# Patient Record
Sex: Male | Born: 1945 | Race: White | Hispanic: No | Marital: Married | Smoking: Former smoker
Health system: Southern US, Community
[De-identification: ages and names within clinical notes are randomized; demographics above are authoritative.]

## PROBLEM LIST (undated history)

## (undated) DIAGNOSIS — Z87442 Personal history of urinary calculi: Secondary | ICD-10-CM

## (undated) DIAGNOSIS — C801 Malignant (primary) neoplasm, unspecified: Secondary | ICD-10-CM

## (undated) DIAGNOSIS — E785 Hyperlipidemia, unspecified: Secondary | ICD-10-CM

## (undated) DIAGNOSIS — K279 Peptic ulcer, site unspecified, unspecified as acute or chronic, without hemorrhage or perforation: Secondary | ICD-10-CM

## (undated) DIAGNOSIS — I10 Essential (primary) hypertension: Secondary | ICD-10-CM

## (undated) DIAGNOSIS — K219 Gastro-esophageal reflux disease without esophagitis: Secondary | ICD-10-CM

## (undated) DIAGNOSIS — H269 Unspecified cataract: Secondary | ICD-10-CM

## (undated) DIAGNOSIS — I219 Acute myocardial infarction, unspecified: Secondary | ICD-10-CM

## (undated) DIAGNOSIS — I2510 Atherosclerotic heart disease of native coronary artery without angina pectoris: Secondary | ICD-10-CM

## (undated) DIAGNOSIS — M1990 Unspecified osteoarthritis, unspecified site: Secondary | ICD-10-CM

## (undated) DIAGNOSIS — K635 Polyp of colon: Secondary | ICD-10-CM

## (undated) DIAGNOSIS — N200 Calculus of kidney: Secondary | ICD-10-CM

## (undated) DIAGNOSIS — T7840XA Allergy, unspecified, initial encounter: Secondary | ICD-10-CM

## (undated) DIAGNOSIS — K2270 Barrett's esophagus without dysplasia: Secondary | ICD-10-CM

## (undated) DIAGNOSIS — R0600 Dyspnea, unspecified: Secondary | ICD-10-CM

## (undated) DIAGNOSIS — E291 Testicular hypofunction: Secondary | ICD-10-CM

## (undated) HISTORY — PX: NECK LESION BIOPSY: SHX2078

## (undated) HISTORY — DX: Atherosclerotic heart disease of native coronary artery without angina pectoris: I25.10

## (undated) HISTORY — DX: Essential (primary) hypertension: I10

## (undated) HISTORY — DX: Unspecified osteoarthritis, unspecified site: M19.90

## (undated) HISTORY — PX: REFRACTIVE SURGERY: SHX103

## (undated) HISTORY — DX: Polyp of colon: K63.5

## (undated) HISTORY — PX: ANGIOPLASTY: SHX39

## (undated) HISTORY — DX: Barrett's esophagus without dysplasia: K22.70

## (undated) HISTORY — DX: Allergy, unspecified, initial encounter: T78.40XA

## (undated) HISTORY — PX: CARDIAC CATHETERIZATION: SHX172

## (undated) HISTORY — PX: KNEE ARTHROSCOPY: SUR90

## (undated) HISTORY — PX: BILROTH II PROCEDURE: SHX1232

## (undated) HISTORY — PX: CIRCUMCISION: SUR203

## (undated) HISTORY — PX: SKIN BIOPSY: SHX1

## (undated) HISTORY — PX: COLONOSCOPY: SHX174

## (undated) HISTORY — PX: UPPER GI ENDOSCOPY: SHX6162

## (undated) HISTORY — PX: WISDOM TOOTH EXTRACTION: SHX21

## (undated) HISTORY — DX: Acute myocardial infarction, unspecified: I21.9

## (undated) HISTORY — DX: Hyperlipidemia, unspecified: E78.5

## (undated) HISTORY — DX: Calculus of kidney: N20.0

## (undated) HISTORY — DX: Peptic ulcer, site unspecified, unspecified as acute or chronic, without hemorrhage or perforation: K27.9

## (undated) HISTORY — PX: POLYPECTOMY: SHX149

## (undated) HISTORY — DX: Unspecified cataract: H26.9

## (undated) HISTORY — DX: Gastro-esophageal reflux disease without esophagitis: K21.9

## (undated) HISTORY — PX: OTHER SURGICAL HISTORY: SHX169

---

## 1994-12-21 DIAGNOSIS — I219 Acute myocardial infarction, unspecified: Secondary | ICD-10-CM

## 1994-12-21 HISTORY — DX: Acute myocardial infarction, unspecified: I21.9

## 2013-08-16 DIAGNOSIS — I10 Essential (primary) hypertension: Secondary | ICD-10-CM | POA: Diagnosis not present

## 2013-08-16 DIAGNOSIS — Z125 Encounter for screening for malignant neoplasm of prostate: Secondary | ICD-10-CM | POA: Diagnosis not present

## 2013-08-16 DIAGNOSIS — E785 Hyperlipidemia, unspecified: Secondary | ICD-10-CM | POA: Diagnosis not present

## 2013-08-28 DIAGNOSIS — Z125 Encounter for screening for malignant neoplasm of prostate: Secondary | ICD-10-CM | POA: Diagnosis not present

## 2013-08-28 DIAGNOSIS — I10 Essential (primary) hypertension: Secondary | ICD-10-CM | POA: Diagnosis not present

## 2013-08-28 DIAGNOSIS — Z0000 Encounter for general adult medical examination without abnormal findings: Secondary | ICD-10-CM | POA: Diagnosis not present

## 2013-08-28 DIAGNOSIS — I259 Chronic ischemic heart disease, unspecified: Secondary | ICD-10-CM | POA: Diagnosis not present

## 2013-08-28 DIAGNOSIS — Z09 Encounter for follow-up examination after completed treatment for conditions other than malignant neoplasm: Secondary | ICD-10-CM | POA: Diagnosis not present

## 2013-08-28 DIAGNOSIS — E785 Hyperlipidemia, unspecified: Secondary | ICD-10-CM | POA: Diagnosis not present

## 2013-08-28 DIAGNOSIS — Z23 Encounter for immunization: Secondary | ICD-10-CM | POA: Diagnosis not present

## 2013-08-28 DIAGNOSIS — K219 Gastro-esophageal reflux disease without esophagitis: Secondary | ICD-10-CM | POA: Diagnosis not present

## 2013-08-28 DIAGNOSIS — E291 Testicular hypofunction: Secondary | ICD-10-CM | POA: Diagnosis not present

## 2013-08-28 DIAGNOSIS — Z1331 Encounter for screening for depression: Secondary | ICD-10-CM | POA: Diagnosis not present

## 2013-08-29 DIAGNOSIS — Z1212 Encounter for screening for malignant neoplasm of rectum: Secondary | ICD-10-CM | POA: Diagnosis not present

## 2013-08-29 LAB — IFOBT (OCCULT BLOOD): IFOBT: NEGATIVE

## 2013-09-25 ENCOUNTER — Encounter: Payer: Self-pay | Admitting: Internal Medicine

## 2013-09-29 ENCOUNTER — Encounter: Payer: Self-pay | Admitting: Cardiology

## 2013-09-29 ENCOUNTER — Ambulatory Visit (INDEPENDENT_AMBULATORY_CARE_PROVIDER_SITE_OTHER): Payer: Medicare Other | Admitting: Cardiology

## 2013-09-29 VITALS — BP 120/78 | HR 52 | Ht 68.5 in | Wt 206.0 lb

## 2013-09-29 DIAGNOSIS — I25810 Atherosclerosis of coronary artery bypass graft(s) without angina pectoris: Secondary | ICD-10-CM | POA: Diagnosis not present

## 2013-09-29 DIAGNOSIS — I2510 Atherosclerotic heart disease of native coronary artery without angina pectoris: Secondary | ICD-10-CM | POA: Diagnosis not present

## 2013-09-29 NOTE — Progress Notes (Signed)
HPI The patient presents as a new patient. He has a history of coronary artery disease with right coronary artery stenting in 1996. He thinks he's had his last stress test greater than 10 years ago. He was in [place] and has moved here. He has had no recent cardiovascular symptoms. He has a very significant family history for early onset coronary artery disease. He is active. He does some exercising a couple of times per week. The patient denies any new symptoms such as chest discomfort, neck or arm discomfort. There has been no new shortness of breath, PND or orthopnea. There have been no reported palpitations, presyncope or syncope.   Allergies not on file  Current Outpatient Prescriptions  Medication Sig Dispense Refill  . carvedilol (COREG) 12.5 MG tablet Take 12.5 mg by mouth 2 (two) times daily with a meal.      . Coenzyme Q10 (CO Q-10) 100 MG CAPS Take 200 mg by mouth daily.      . Multiple Vitamins-Minerals (MULTIVITAMIN PO) Take 1 tablet by mouth daily.      Marland Kitchen omeprazole (PRILOSEC) 10 MG capsule Take 10 mg by mouth daily.      . Oxymetazoline HCl (NASAL SPRAY NA) Place into the nose daily.      . simvastatin (ZOCOR) 40 MG tablet Take 40 mg by mouth every evening.      Marland Kitchen telmisartan-hydrochlorothiazide (MICARDIS HCT) 80-12.5 MG per tablet Take 1 tablet by mouth daily.       No current facility-administered medications for this visit.    Past Medical History  Diagnosis Date  . CAD (coronary artery disease)     Stent to the RCA 1996  . Dyslipidemia   . PUD (peptic ulcer disease)   . Cataract   . Nephrolithiasis     Past Surgical History  Procedure Laterality Date  . Bilroth ii procedure      ROS: As stated in the HPI and negative for all other systems.  PHYSICAL EXAM Ht 5' 8.5" (1.74 m)  Wt 206 lb (93.441 kg)  BMI 30.86 kg/m2 GENERAL:  Well appearing HEENT:  Pupils equal round and reactive, fundi not visualized, oral mucosa unremarkable NECK:  No jugular venous  distention, waveform within normal limits, carotid upstroke brisk and symmetric, no bruits, no thyromegaly LYMPHATICS:  No cervical, inguinal adenopathy LUNGS:  Clear to auscultation bilaterally BACK:  No CVA tenderness CHEST:  Unremarkable HEART:  PMI not displaced or sustained,S1 and S2 within normal limits, no S3, no S4, no clicks, no rubs, no  murmurs ABD:  Flat, positive bowel sounds normal in frequency in pitch, no bruits, no rebound, no guarding, no midline pulsatile mass, no hepatomegaly, no splenomegaly EXT:  2 plus pulses throughout, no edema, no cyanosis no clubbing SKIN:  No rashes no nodules NEURO:  Cranial nerves II through XII grossly intact, motor grossly intact throughout PSYCH:  Cognitively intact, oriented to person place and time   EKG:    Sinus rhythm, rate 52, axis within normal limits, intervals within normal limits, no acute ST-T wave changes.  He did have some ST depression mildly in nonspecific in the inferolateral leads. 09/29/2013  ASSESSMENT AND PLAN    CAD:  The patient has no active symptoms. However, I would like to screen given his very strong family history. I will bring the patient back for a POET (Plain Old Exercise Test). This will allow me to screen for obstructive coronary disease, risk stratify and very importantly provide a prescription  for exercise.  DYSLIPIDEMIA:  I did review some records from [facility] G, MD.  However, I did not see his recent lipids. He's on a lower dose of statin and might be suggested by current guidelines. However, I will defer management to Garlan Fillers, MD And I might not suggest changing his LDL is less than 70. Given the family history of early onset coronary artery disease it would be very aggressive with his lipids.we had a long discussion about exercise.

## 2013-09-29 NOTE — Patient Instructions (Signed)
Your physician has requested that you have an exercise tolerance test. For further information please visit [email or web address]. Please also follow instruction sheet, as given.  Your physician recommends that you continue on your current medications as directed. Please refer to the Current Medication list given to you today.  

## 2013-10-02 DIAGNOSIS — I2510 Atherosclerotic heart disease of native coronary artery without angina pectoris: Secondary | ICD-10-CM | POA: Insufficient documentation

## 2013-10-24 ENCOUNTER — Encounter: Payer: Self-pay | Admitting: Internal Medicine

## 2013-10-30 ENCOUNTER — Ambulatory Visit (INDEPENDENT_AMBULATORY_CARE_PROVIDER_SITE_OTHER): Payer: Medicare Other | Admitting: Internal Medicine

## 2013-10-30 ENCOUNTER — Encounter: Payer: Self-pay | Admitting: Internal Medicine

## 2013-10-30 VITALS — BP 118/70 | HR 56 | Ht 68.0 in | Wt 204.0 lb

## 2013-10-30 DIAGNOSIS — Z86010 Personal history of colonic polyps: Secondary | ICD-10-CM

## 2013-10-30 DIAGNOSIS — K2270 Barrett's esophagus without dysplasia: Secondary | ICD-10-CM

## 2013-10-30 DIAGNOSIS — K219 Gastro-esophageal reflux disease without esophagitis: Secondary | ICD-10-CM | POA: Diagnosis not present

## 2013-10-30 NOTE — Patient Instructions (Signed)
You have been scheduled for an endoscopy with propofol. Please follow written instructions given to you at your visit today. If you use inhalers (even only as needed), please bring them with you on the day of your procedure. Your physician has requested that you go to [email or web address] and enter the access code given to you at your visit today. This web site gives a general overview about your procedure. However, you should still follow specific instructions given to you by our office regarding your preparation for the procedure.                                                We are excited to introduce MyChart, a new best-in-class service that provides you online access to important information in your electronic medical record. We want to make it easier for you to view your health information - all in one secure location - when and where you need it. We expect MyChart will enhance the quality of care and service we provide.  When you register for MyChart, you can:    View your test results.    Request appointments and receive appointment reminders via email.    Request medication renewals.    View your medical history, allergies, medications and immunizations.    Communicate with your physician's office through a password-protected site.    Conveniently print information such as your medication lists.  To find out if MyChart is right for you, please talk to a member of our clinical staff today. We will gladly answer your questions about this free health and wellness tool.  If you are age 18 or older and want a member of your family to have access to your record, you must provide written consent by completing a proxy form available at our office. Please speak to our clinical staff about guidelines regarding accounts for patients younger than age 18.  As you activate your MyChart account and need any technical assistance, please call the MyChart technical support line at [phone]  [phone]) or email your question to [email or web address]. If you email your question(s), please include your name, a return phone number and the best time to reach you.  If you have non-urgent health-related questions, you can send a message to our office through MyChart at [email or web address]. If you have a medical emergency, call 911.  Thank you for using MyChart as your new health and wellness resource!   MyChart licensed from [employer],  1999-2010. Patents Pending.   

## 2013-10-30 NOTE — Progress Notes (Addendum)
Patient ID: Daniel Colon, male   DOB: 01-20-1946, 67 y.o.   MRN: [id number] HPI: Daniel Colon is a 67 year old male with a past medical history of CAD status post PCI in 1996, dyslipidemia, peptic ulcer disease status post Billroth II in approximately 2010 , history of adenomatous colon polyps, GERD with Barrett's esophagus was seen in consultation at the request of Dr. Eloise Harman for evaluation of Barrett's surveillance.  Daniel Colon is here alone today. Daniel Colon reports Daniel Colon is feeling well. Daniel Colon reports history of one prior upper endoscopy which revealed Barrett's esophagus. This was performed in [place]. Daniel Colon has been maintained on PPI continuously since his diagnosis of ulcer disease in 2010. Daniel Colon does report a history of heartburn about twice per month. This is even more rare with PPI. Regarding his ulcer surgery, Daniel Colon reports it happened quickly redeveloped pain was admitted to the hospital and had surgery shortly thereafter. Since the surgery Daniel Colon's had loose stools, but Daniel Colon denies a change in bowel habit including no blood in his stool or melena. Daniel Colon denies abdominal pain today. No dysphagia or odynophagia. No nausea or vomiting.  Daniel Colon reports history of multiple colonoscopies in the past, per records from Dr. Silvano Rusk office last colonoscopy was May 2011 polyps removed hyperplastic only. Notes indicate prior polyps were adenomatous, with initial colonoscopy around 2000 with one tubulovillous adenoma with high-grade dysplasia. Subsequent colonoscopies without significant adenomas.  This record is from Dr. Silvano Rusk office note, I do not yet have copies of each individual colonoscopy.  Past Medical History  Diagnosis Date  . CAD (coronary artery disease)     Stent to the RCA 1996  . Dyslipidemia   . PUD (peptic ulcer disease)   . Cataract   . Nephrolithiasis   . Barrett's esophagus   . Colon polyps   . GERD (gastroesophageal reflux disease)     Past Surgical History  Procedure Laterality Date  . Bilroth ii procedure    .  Angioplasty      stent - rt cor. art  . Ulcer surgery      Current Outpatient Prescriptions  Medication Sig Dispense Refill  . carvedilol (COREG) 12.5 MG tablet Take 12.5 mg by mouth 2 (two) times daily with a meal.      . Coenzyme Q10 (CO Q-10) 100 MG CAPS Take 200 mg by mouth daily.      . fish oil-omega-3 fatty acids 1000 MG capsule Take 2 g by mouth daily.      . Multiple Vitamins-Minerals (MULTIVITAMIN PO) Take 1 tablet by mouth daily.      Marland Kitchen omeprazole (PRILOSEC) 10 MG capsule Take 10 mg by mouth daily.      . Oxymetazoline HCl (NASAL SPRAY NA) Place into the nose daily.      . simvastatin (ZOCOR) 40 MG tablet Take 40 mg by mouth every evening.      Marland Kitchen telmisartan-hydrochlorothiazide (MICARDIS HCT) 80-12.5 MG per tablet Take 1 tablet by mouth daily.       No current facility-administered medications for this visit.    No Known Allergies  Family History  Problem Relation Age of Onset  . Sudden death Mother 40  . CAD Brother 74    CABG  . CAD Sister 72    CABG  . CAD Sister 67    CABG  . Cancer Brother   . Liver disease      History  Substance Use Topics  . Smoking status: Former Smoker -- 1.00 packs/day for 42 years  Types: Cigarettes    Quit date: 10/31/2011  . Smokeless tobacco: Current User     Comment: Quit 2.5 years ago.   . Alcohol Use: Yes     Comment: 2 drinks per week    ROS: As per history of present illness, otherwise negative  BP 118/70  Pulse 56  Ht 5\' 8"  (1.727 m)  Wt 204 lb (92.534 kg)  BMI 31.03 kg/m2 Constitutional: Well-developed and well-nourished. No distress. HEENT: Normocephalic and atraumatic. Oropharynx is clear and moist. No oropharyngeal exudate. Conjunctivae are normal.  No scleral icterus. Neck: Neck supple. Trachea midline. Cardiovascular: Normal rate, regular rhythm and intact distal pulses. No M/R/G Pulmonary/chest: Effort normal and breath sounds normal. No wheezing, rales or rhonchi. Abdominal: Soft, nontender,  nondistended. Bowel sounds active throughout. There are no masses palpable. No hepatosplenomegaly. Well-healed midline abdominal scar Extremities: no clubbing, cyanosis, or edema Lymphadenopathy: No cervical adenopathy noted. Neurological: Alert and oriented to person place and time. Skin: Skin is warm and dry. No rashes noted. Psychiatric: Normal mood and affect. Behavior is normal.  ASSESSMENT/PLAN: 67 year old male with a past medical history of CAD status post PCI in 1996, dyslipidemia, peptic ulcer disease status post Billroth II in approximately 2010 , history of adenomatous colon polyps, GERD with Barrett's esophagus was seen in consultation at the request of Dr. Eloise Harman for evaluation of Barrett's surveillance.   1.  Barrett's esophagus/GERD -- we are requesting his EGD report with pathology report for review. It seems that Daniel Colon has had only one endoscopy at least 3 years ago. Based on this, and current national guidelines for Barrett's surveillance, Daniel Colon would be due repeat upper endoscopy at this time. We discussed the test including the risks and benefits and Daniel Colon is agreeable to proceed. We discussed the best now supports indefinite PPI for patients with Barrett's esophagus. With this in mind, Daniel Colon will continue daily omeprazole. Daniel Colon is not having significant GERD symptoms at present.  2.  History of adenomatous colon polyps/CRC screening -- per PCP note, Daniel Colon had hyperplastic polyps only removed in 2011. Given his history of previous adenomatous polyps, Daniel Colon would be due repeat surveillance colonoscopy in May 2016.   Addendum - colonoscopy records received Colonoscopy dated 05/16/2010 -- Dr. Anselm Jungling -- [facility] -- 2 benign-appearing diminutive polyps in the rectum. Redundant colon. Examined portion of the ileum normal. Recommend repeat colonoscopy 5 years for surveillance EGD 05/05/2010 -- Dr. Anselm Jungling -- esophageal mucosal changes consistent with long segment Barrett's esophagus. This  was biopsied. GERD as evidenced by free flow of gastric contents into the esophagus. Bilious gastric fluid. Patent Billroth II gastrojejunostomy. Pathology results available

## 2013-10-31 ENCOUNTER — Ambulatory Visit: Payer: Self-pay | Admitting: Cardiology

## 2013-11-02 ENCOUNTER — Encounter: Payer: Self-pay | Admitting: Internal Medicine

## 2013-11-02 ENCOUNTER — Ambulatory Visit (AMBULATORY_SURGERY_CENTER): Payer: Medicare Other | Admitting: Internal Medicine

## 2013-11-02 VITALS — BP 110/60 | HR 46 | Temp 96.4°F | Resp 16 | Ht 68.0 in | Wt 204.0 lb

## 2013-11-02 DIAGNOSIS — K2270 Barrett's esophagus without dysplasia: Secondary | ICD-10-CM

## 2013-11-02 DIAGNOSIS — E669 Obesity, unspecified: Secondary | ICD-10-CM | POA: Diagnosis not present

## 2013-11-02 DIAGNOSIS — I739 Peripheral vascular disease, unspecified: Secondary | ICD-10-CM | POA: Diagnosis not present

## 2013-11-02 DIAGNOSIS — I2510 Atherosclerotic heart disease of native coronary artery without angina pectoris: Secondary | ICD-10-CM | POA: Diagnosis not present

## 2013-11-02 DIAGNOSIS — K219 Gastro-esophageal reflux disease without esophagitis: Secondary | ICD-10-CM | POA: Diagnosis not present

## 2013-11-02 MED ORDER — SODIUM CHLORIDE 0.9 % IV SOLN: 500.0000 mL | INTRAVENOUS | Status: DC

## 2013-11-02 NOTE — Patient Instructions (Signed)
Discharge instructions given with verbal understanding. Biopsies taken. Resume previous medications. YOU HAD AN ENDOSCOPIC PROCEDURE TODAY AT THE [facility]: Refer to the procedure report that was given to you for any specific questions about what was found during the examination.  If the procedure report does not answer your questions, please call your gastroenterologist to clarify.  If you requested that your care partner not be given the details of your procedure findings, then the procedure report has been included in a sealed envelope for you to review at your convenience later.  YOU SHOULD EXPECT: Some feelings of bloating in the abdomen. Passage of more gas than usual.  Walking can help get rid of the air that was put into your GI tract during the procedure and reduce the bloating. If you had a lower endoscopy (such as a colonoscopy or flexible sigmoidoscopy) you may notice spotting of blood in your stool or on the toilet paper. If you underwent a bowel prep for your procedure, then you may not have a normal bowel movement for a few days.  DIET: Your first meal following the procedure should be a light meal and then it is ok to progress to your normal diet.  A half-sandwich or bowl of soup is an example of a good first meal.  Heavy or fried foods are harder to digest and may make you feel nauseous or bloated.  Likewise meals heavy in dairy and vegetables can cause extra gas to form and this can also increase the bloating.  Drink plenty of fluids but you should avoid alcoholic beverages for 24 hours.  ACTIVITY: Your care partner should take you home directly after the procedure.  You should plan to take it easy, moving slowly for the rest of the day.  You can resume normal activity the day after the procedure however you should NOT DRIVE or use heavy machinery for 24 hours (because of the sedation medicines used during the test).    SYMPTOMS TO REPORT IMMEDIATELY: A gastroenterologist  can be reached at any hour.  During normal business hours, 8:30 AM to 5:00 PM Monday through Friday, call [phone].  After hours and on weekends, please call the GI answering service at [phone] who will take a message and have the physician on call contact you.   Following upper endoscopy (EGD)  Vomiting of blood or coffee ground material  New chest pain or pain under the shoulder blades  Painful or persistently difficult swallowing  New shortness of breath  Fever of 100F or higher  Black, tarry-looking stools  FOLLOW UP: If any biopsies were taken you will be contacted by phone or by letter within the next 1-3 weeks.  Call your gastroenterologist if you have not heard about the biopsies in 3 weeks.  Our staff will call the home number listed on your records the next business day following your procedure to check on you and address any questions or concerns that you may have at that time regarding the information given to you following your procedure. This is a courtesy call and so if there is no answer at the home number and we have not heard from you through the emergency physician on call, we will assume that you have returned to your regular daily activities without incident.  SIGNATURES/CONFIDENTIALITY: You and/or your care partner have signed paperwork which will be entered into your electronic medical record.  These signatures attest to the fact that that the information above on your After   Visit Summary has been reviewed and is understood.  Full responsibility of the confidentiality of this discharge information lies with you and/or your care-partner. 

## 2013-11-02 NOTE — Progress Notes (Signed)
Patient did not experience any of the following events: a burn prior to discharge; a fall within the facility; wrong site/side/patient/procedure/implant event; or a hospital transfer or hospital admission upon discharge from the facility. (G8907) Patient did not have preoperative order for IV antibiotic SSI prophylaxis. (G8918)  

## 2013-11-02 NOTE — Progress Notes (Signed)
Called to room to assist during endoscopic procedure.  Patient ID and intended procedure confirmed with present staff. Received instructions for my participation in the procedure from the performing physician.  

## 2013-11-02 NOTE — Op Note (Signed)
[facility] [address] [place], [address]   ENDOSCOPY PROCEDURE REPORT  PATIENT: Daniel Colon, Daniel Colon  MR#: [id number] BIRTHDATE: 1946/07/26 , 67  yrs. old GENDER: Male ENDOSCOPIST: Beverley Fiedler, MD REFERRED BY:  Jarome Matin, M.D. PROCEDURE DATE:  11/02/2013 PROCEDURE:  EGD w/ biopsy ASA CLASS:     Class II INDICATIONS:  history of Barrett's esophagus. MEDICATIONS: MAC sedation, administered by CRNA and propofol (Diprivan) 200mg  IV TOPICAL ANESTHETIC: Cetacaine Spray  DESCRIPTION OF PROCEDURE: After the risks benefits and alternatives of the procedure were thoroughly explained, informed consent was obtained.  The LB [id number] endoscope was introduced through the mouth and advanced to the proximal jejunum. Without limitations.  The instrument was slowly withdrawn as the mucosa was fully examined.    ESOPHAGUS: There was evidence of Barrett's esophagus in the lower third of the esophagus. Beginning 36 cm from the incisors and extending to the top of the gastric folds at 39 cm.  Multiple biopsies were performed using cold forceps.  Sample sent for histology.   Pr[place] Criteria C2 M3.  A 2 cm hiatal hernia was noted.  STOMACH: A Billroth II anastomosis was found in the gastric body characterized as healthy in appearance.  JEJUNUM: The exam showed no abnormalities in the jejunum.  Retroflexed views revealed a hiatal hernia.     The scope was then withdrawn from the patient and the procedure completed.  COMPLICATIONS: There were no complications.  ENDOSCOPIC IMPRESSION: 1.   There was evidence of Barrett's esophagus; multiple biopsies 2.   2 cm hiatal hernia 3.   Billroth II anastomosis was found in the gastric body 4.   The exam showed no abnormalities  RECOMMENDATIONS: 1.  Await pathology results 2.  Continue taking your PPI (antiacid medicine) once daily.  It is best to be taken 20-30 minutes prior to breakfast meal.  REPEAT EXAM: for EGD  pending biopsy results. eSigned:  Beverley Fiedler, MD 11/02/2013 9:52 AM  CC:The Patient and Jarome Matin, MD  PATIENT NAME:  Daniel Colon, Daniel Colon MR#: [id number]

## 2013-11-02 NOTE — Progress Notes (Signed)
Lidocaine-40mg IV prior to Propofol InductionPropofol given over incremental dosages 

## 2013-11-03 ENCOUNTER — Telehealth: Payer: Self-pay

## 2013-11-03 NOTE — Telephone Encounter (Signed)
  Follow up Call-  Call back number 11/02/2013  Post procedure Call Back phone  # [phone] OK TO SPEAK WITH WIFE  Permission to leave phone message Yes     Patient questions:  Do you have a fever, pain , or abdominal swelling? no Pain Score  0 *  Have you tolerated food without any problems? yes  Have you been able to return to your normal activities? yes  Do you have any questions about your discharge instructions: Diet   no Medications  no Follow up visit  no  Do you have questions or concerns about your Care? no  Actions: * If pain score is 4 or above: No action needed, pain <4.

## 2013-11-09 ENCOUNTER — Encounter: Payer: Self-pay | Admitting: Internal Medicine

## 2013-11-13 ENCOUNTER — Encounter: Payer: Self-pay | Admitting: *Deleted

## 2013-11-13 ENCOUNTER — Ambulatory Visit (INDEPENDENT_AMBULATORY_CARE_PROVIDER_SITE_OTHER): Payer: Medicare Other | Admitting: Cardiology

## 2013-11-13 DIAGNOSIS — I25810 Atherosclerosis of coronary artery bypass graft(s) without angina pectoris: Secondary | ICD-10-CM | POA: Diagnosis not present

## 2013-11-13 DIAGNOSIS — R9439 Abnormal result of other cardiovascular function study: Secondary | ICD-10-CM | POA: Diagnosis not present

## 2013-11-13 DIAGNOSIS — Z01810 Encounter for preprocedural cardiovascular examination: Secondary | ICD-10-CM | POA: Diagnosis not present

## 2013-11-13 LAB — BASIC METABOLIC PANEL
BUN: 18 mg/dL (ref 6–23)
CO2: 24 mEq/L (ref 19–32)
Chloride: 105 mEq/L (ref 96–112)
Creatinine, Ser: 0.9 mg/dL (ref 0.4–1.5)
GFR: 93.52 mL/min (ref >60.00)
Glucose, Bld: 92 mg/dL (ref 70–99)
Potassium: 4 mEq/L (ref 3.5–5.1)

## 2013-11-13 LAB — CBC
HCT: 39.8 % (ref 39.0–52.0)
Hemoglobin: 13.3 g/dL (ref 13.0–17.0)
MCHC: 33.4 g/dL (ref 30.0–36.0)
MCV: 81.1 fl (ref 78.0–100.0)
RDW: 14.4 % (ref 11.5–14.6)

## 2013-11-13 LAB — PROTIME-INR: INR: 1 ratio (ref 0.8–1.0)

## 2013-11-13 NOTE — Progress Notes (Signed)
Exercise Treadmill Test  Pre-Exercise Testing Evaluation Rhythm: normal sinus  Rate: 63     Test  Exercise Tolerance Test Ordering MD: Angelina Sheriff, MD  Interpreting MD: Angelina Sheriff, MD  Unique Test No: 1  Treadmill:  1  Indication for ETT: known ASHD  Contraindication to ETT: No   Stress Modality: exercise - treadmill  Cardiac Imaging Performed: non   Protocol: standard Bruce - maximal  Max BP:  152/67  Max MPHR (bpm):  155 85% MPR (bpm):  133  MPHR obtained (bpm):  110 % MPHR obtained:  75  Reached 85% MPHR (min:sec):  NA Total Exercise Time (min-sec):  4:57  Workload in METS:  6.9 Borg Scale: 13  Reason ETT Terminated:  downsloping ST depression    ST Segment Analysis At Rest: non-specific ST segment slurring With Exercise: significant ischemic ST depression  Other Information Arrhythmia:  No Angina during ETT:  absent (0) Quality of ETT:  diagnostic  ETT Interpretation:  abnormal - evidence of ST depression consistent with ischemia  Comments: The patient had a markedly abnormal exercise treadmill with 3 mm ST depression in downsloping in the inferior and lateral leads.  He did not have chest discomfort however.  Recommendations: Early positive exercise treadmill test with a high pretest probability of obstructive coronary disease. He will have cardiac catheterization. The patient understands that risks included but are not limited to stroke (1 in 1000), death (1 in 1000), kidney failure [usually temporary] (1 in 500), bleeding (1 in 200), allergic reaction [possibly serious] (1 in 200).  The patient understands and agrees to proceed.

## 2013-11-13 NOTE — Patient Instructions (Signed)
The current medical regimen is effective;  continue present plan and medications.  Please have blood work today.  Your physician has requested that you have a cardiac catheterization. Cardiac catheterization is used to diagnose and/or treat various heart conditions. Doctors may recommend this procedure for a number of different reasons. The most common reason is to evaluate chest pain. Chest pain can be a symptom of coronary artery disease (CAD), and cardiac catheterization can show whether plaque is narrowing or blocking your heart's arteries. This procedure is also used to evaluate the valves, as well as measure the blood flow and oxygen levels in different parts of your heart. For further information please visit [email or web address] Please follow instruction sheet, as given.  Further follow up will be scheduled at the time of your catheterization.

## 2013-11-16 ENCOUNTER — Other Ambulatory Visit: Payer: Self-pay | Admitting: Cardiovascular Disease

## 2013-11-16 DIAGNOSIS — R9439 Abnormal result of other cardiovascular function study: Secondary | ICD-10-CM

## 2013-11-17 ENCOUNTER — Ambulatory Visit (HOSPITAL_COMMUNITY)
Admission: RE | Admit: 2013-11-17 | Discharge: 2013-11-17 | Disposition: A | Discharge status: Home / Self Care | Payer: Medicare Other | Source: Ambulatory Visit | Attending: Cardiovascular Disease | Admitting: Cardiovascular Disease

## 2013-11-17 ENCOUNTER — Encounter (HOSPITAL_COMMUNITY)
Admission: RE | Disposition: A | Discharge status: Home / Self Care | Payer: Self-pay | Source: Ambulatory Visit | Attending: Cardiovascular Disease

## 2013-11-17 DIAGNOSIS — I2510 Atherosclerotic heart disease of native coronary artery without angina pectoris: Secondary | ICD-10-CM | POA: Diagnosis not present

## 2013-11-17 DIAGNOSIS — E785 Hyperlipidemia, unspecified: Secondary | ICD-10-CM | POA: Diagnosis not present

## 2013-11-17 DIAGNOSIS — Y831 Surgical operation with implant of artificial internal device as the cause of abnormal reaction of the patient, or of later complication, without mention of misadventure at the time of the procedure: Secondary | ICD-10-CM | POA: Insufficient documentation

## 2013-11-17 DIAGNOSIS — Z9861 Coronary angioplasty status: Secondary | ICD-10-CM | POA: Diagnosis not present

## 2013-11-17 DIAGNOSIS — R9439 Abnormal result of other cardiovascular function study: Secondary | ICD-10-CM | POA: Diagnosis not present

## 2013-11-17 DIAGNOSIS — Z87891 Personal history of nicotine dependence: Secondary | ICD-10-CM | POA: Insufficient documentation

## 2013-11-17 HISTORY — PX: LEFT HEART CATHETERIZATION WITH CORONARY ANGIOGRAM: SHX5451

## 2013-11-17 SURGERY — LEFT HEART CATHETERIZATION WITH CORONARY ANGIOGRAM
Anesthesia: LOCAL

## 2013-11-17 MED ORDER — SODIUM CHLORIDE 0.9 % IV SOLN: 250.0000 mL | INTRAVENOUS | Status: DC | PRN

## 2013-11-17 MED ORDER — ASPIRIN 81 MG PO CHEW
81.0000 mg | CHEWABLE_TABLET | ORAL | Status: AC
  Administered 2013-11-17: 81 mg via ORAL

## 2013-11-17 MED ORDER — SODIUM CHLORIDE 0.9 % IV SOLN
INTRAVENOUS | Status: DC
  Administered 2013-11-17: 1000 mL via INTRAVENOUS

## 2013-11-17 MED ORDER — ACETAMINOPHEN 325 MG PO TABS: 650.0000 mg | ORAL_TABLET | ORAL | Status: DC | PRN

## 2013-11-17 MED ORDER — ASPIRIN 81 MG PO CHEW
CHEWABLE_TABLET | ORAL | Status: AC
  Filled 2013-11-17: qty 1

## 2013-11-17 MED ORDER — HEPARIN SODIUM (PORCINE) 1000 UNIT/ML IJ SOLN
INTRAMUSCULAR | Status: AC
  Filled 2013-11-17: qty 1

## 2013-11-17 MED ORDER — VERAPAMIL HCL 2.5 MG/ML IV SOLN
INTRAVENOUS | Status: AC
  Filled 2013-11-17: qty 2

## 2013-11-17 MED ORDER — SODIUM CHLORIDE 0.9 % IV SOLN: 1.0000 mL/kg/h | INTRAVENOUS | Status: DC

## 2013-11-17 MED ORDER — LIDOCAINE HCL (PF) 1 % IJ SOLN
INTRAMUSCULAR | Status: AC
  Filled 2013-11-17: qty 30

## 2013-11-17 MED ORDER — DIAZEPAM 5 MG PO TABS
5.0000 mg | ORAL_TABLET | ORAL | Status: AC
  Administered 2013-11-17: 5 mg via ORAL

## 2013-11-17 MED ORDER — SODIUM CHLORIDE 0.9 % IJ SOLN: 3.0000 mL | INTRAMUSCULAR | Status: DC | PRN

## 2013-11-17 MED ORDER — FENTANYL CITRATE 0.05 MG/ML IJ SOLN
INTRAMUSCULAR | Status: AC
  Filled 2013-11-17: qty 2

## 2013-11-17 MED ORDER — DIAZEPAM 5 MG PO TABS
ORAL_TABLET | ORAL | Status: AC
  Filled 2013-11-17: qty 1

## 2013-11-17 MED ORDER — HEPARIN (PORCINE) IN NACL 2-0.9 UNIT/ML-% IJ SOLN
INTRAMUSCULAR | Status: AC
  Filled 2013-11-17: qty 1500

## 2013-11-17 MED ORDER — OXYCODONE-ACETAMINOPHEN 5-325 MG PO TABS: 1.0000 | ORAL_TABLET | ORAL | Status: DC | PRN

## 2013-11-17 MED ORDER — MIDAZOLAM HCL 2 MG/2ML IJ SOLN
INTRAMUSCULAR | Status: AC
  Filled 2013-11-17: qty 2

## 2013-11-17 MED ORDER — SODIUM CHLORIDE 0.9 % IJ SOLN: 3.0000 mL | Freq: Two times a day (BID) | INTRAMUSCULAR | Status: DC

## 2013-11-17 MED ORDER — NITROGLYCERIN 0.2 MG/ML ON CALL CATH LAB
INTRAVENOUS | Status: AC
  Filled 2013-11-17: qty 1

## 2013-11-17 MED ORDER — ONDANSETRON HCL 4 MG/2ML IJ SOLN: 4.0000 mg | Freq: Four times a day (QID) | INTRAMUSCULAR | Status: DC | PRN

## 2013-11-17 NOTE — Interval H&P Note (Signed)
History and Physical Interval Note:  11/17/2013 8:01 AM  Daniel Colon  has presented today for surgery, with the diagnosis of positive stress test  The various methods of treatment have been discussed with the patient and family. After consideration of risks, benefits and other options for treatment, the patient has consented to  Procedure(s): LEFT HEART CATHETERIZATION WITH CORONARY ANGIOGRAM (N/A) as a surgical intervention .  The patient's history has been reviewed, patient examined, no change in status, stable for surgery.  I have reviewed the patient's chart and labs.  Questions were answered to the patient's satisfaction.    Cath Lab Visit (complete for each Cath Lab visit)  Clinical Evaluation Leading to the Procedure:   ACS: no  Non-ACS:    Anginal Classification: No Symptoms  Anti-ischemic medical therapy: Minimal Therapy (1 class of medications)  Non-Invasive Test Results: High-risk stress test findings: cardiac mortality >3%/year  Prior CABG: No previous CABG       Tonny Bollman

## 2013-11-17 NOTE — CV Procedure (Signed)
    Cardiac Catheterization Procedure Note  Name: Daniel Colon MRN: [id number] DOB: May 11, 1946  Procedure: Left Heart Cath, Selective Coronary Angiography, LV angiography  Indication: Abnormal stress EKG with high-risk features, known CAD   Procedural Details: The right wrist was prepped, draped, and anesthetized with 1% lidocaine. Using the modified Seldinger technique, a 5/6 French sheath was introduced into the right radial artery. 3 mg of verapamil was administered through the sheath, weight-based unfractionated heparin was administered intravenously. Standard Judkins catheters were used for selective coronary angiography and left ventriculography. Catheter exchanges were performed over an exchange length guidewire. There were no immediate procedural complications. A TR band was used for radial hemostasis at the completion of the procedure.  The patient was transferred to the post catheterization recovery area for further monitoring.  Procedural Findings: Hemodynamics: AO 103/55 LV 104/12  Coronary angiography: Coronary dominance: right  Left mainstem: Moderate calcification. No significant obstruction. Arises from the left cusp and divides into the LAD and LCx.  Left anterior descending (LAD): Diffusely diseased and calcified vessel. The proximal vessel has 30-40% stenosis. The first diagonal is patent with mild nonobstructive stenosis no greater than 30%. The mid-LAD has moderately severe 70-75% stenosis.  Left circumflex (LCx): The LCx is large in caliber. There is a medium caliber OM1 without significant stenosis. The mid-distal LCx has 70% stenosis and it supplies a large OM2 branch.  Right coronary artery (RCA): Dominant vessel with mild-moderate calcification. The stented segment in the mid-vessel is patent with 40% restenosis. Otherwise the vessel has diffuse irregularity without significant stenosis.   Left ventriculography: Left ventricular systolic function is normal, LVEF  is estimated at 55-65%, there is no significant mitral regurgitation   Final Conclusions:   1. Moderate diffuse CAD with borderline lesions in the mid-LAD and mid-LCx and continued patency of the RCA stent with mild in-stent restenosis.  2. Normal LV function  Recommendations: The patient has lesions in the mid-LAD and mid-LCx which may be hemodynamically significant. However, they are clearly not critically stenosed. Considering this patient's asymptomatic status at a normal functional capacity, I would favor ongoing medical therapy.   Tonny Bollman 11/17/2013, 8:50 AM

## 2013-11-17 NOTE — Progress Notes (Signed)
H&P needed, Dr Excell Seltzer notified.

## 2013-11-17 NOTE — H&P (Signed)
History and Physical  Patient ID: Daniel Colon MRN: [id number], SOB: [birth year] 67 y.o. Date of Encounter: 11/17/2013, 7:56 AM  Primary Physician: Garlan Fillers, MD Primary Cardiologist: Dr Antoine Poche  Chief Complaint: Abnormal Stress Test  HPI: 67 y.o. male w/ PMHx significant for CAD who presented to [facility] on 11/17/2013 for cardiac catheterization.  The patient underwent stenting of the RCA in 1996 in [place]. He has no symptoms of chest pain, shortness of breath, or palpitations. He exercises without exertional symptoms.   He recently underwent a screening treadmill study that was markedly abnormal with early and marked ST depression. This study was high-risk and he was sent for cath and possible PCI.   Past Medical History  Diagnosis Date  . CAD (coronary artery disease)     Stent to the RCA 1996  . Dyslipidemia   . PUD (peptic ulcer disease)   . Cataract   . Nephrolithiasis   . Barrett's esophagus   . Colon polyps   . GERD (gastroesophageal reflux disease)      Surgical History:  Past Surgical History  Procedure Laterality Date  . Bilroth ii procedure    . Angioplasty      stent - rt cor. art  . Ulcer surgery       Home Meds: Prior to Admission medications   Medication Sig Start Date End Date Taking? Authorizing Provider  carvedilol (COREG) 12.5 MG tablet Take 12.5 mg by mouth 2 (two) times daily with a meal.   Yes Historical Provider, MD  Coenzyme Q10 (CO Q-10) 100 MG CAPS Take 200 mg by mouth daily.   Yes Historical Provider, MD  fish oil-omega-3 fatty acids 1000 MG capsule Take 2 g by mouth daily.   Yes Historical Provider, MD  Multiple Vitamins-Minerals (MULTIVITAMIN PO) Take 1 tablet by mouth daily.   Yes Historical Provider, MD  omeprazole (PRILOSEC) 10 MG capsule Take 10 mg by mouth daily.   Yes Historical Provider, MD  Oxymetazoline HCl (NASAL SPRAY NA) Place into the nose daily.   Yes Historical Provider, MD  simvastatin (ZOCOR) 40 MG  tablet Take 40 mg by mouth every evening.   Yes Historical Provider, MD  telmisartan-hydrochlorothiazide (MICARDIS HCT) 80-12.5 MG per tablet Take 1 tablet by mouth daily.   Yes Historical Provider, MD  Tetrahydrozoline HCl (VISINE OP) Apply 1 drop to eye daily as needed (for dry eyes).   Yes Historical Provider, MD    Allergies: No Known Allergies  History   Social History  . Marital Status: Married    Spouse Name: N/A    Number of Children: 1  . Years of Education: N/A   Occupational History  . Not on file.   Social History Main Topics  . Smoking status: Former Smoker -- 1.00 packs/day for 42 years    Types: Cigarettes    Quit date: 10/31/2011  . Smokeless tobacco: Current User    Types: Chew     Comment: Quit 2.5 years ago.   . Alcohol Use: Yes     Comment: 2 drinks per week  . Drug Use: No  . Sexual Activity: Not on file   Other Topics Concern  . Not on file   Social History Narrative   Lives with wife and mother in law.      Family History  Problem Relation Age of Onset  . Sudden death Mother 51  . CAD Brother 76    CABG  . CAD Sister 5  CABG  . CAD Sister 77    CABG  . Cancer Brother   . Liver disease      Review of Systems: General: negative for chills, fever, night sweats or weight changes.  ENT: negative for rhinorrhea or epistaxis Cardiovascular: negative for chest pain, shortness of breath, dyspnea on exertion, edema, orthopnea, palpitations, or paroxysmal nocturnal dyspnea Dermatological: negative for rash Respiratory: negative for cough or wheezing GI: negative for nausea, vomiting, diarrhea, bright red blood per rectum, melena, or hematemesis GU: no hematuria, urgency, or frequency Neurologic: negative for visual changes, syncope, headache, or dizziness Heme: no easy bruising or bleeding Endo: negative for excessive thirst, thyroid disorder, or flushing Musculoskeletal: negative for joint pain or swelling, negative for myalgias All other  systems reviewed and are otherwise negative except as noted above.  Physical Exam: Blood pressure 158/82, pulse 67, temperature 97.1 F (36.2 C), temperature source Oral, resp. rate 18, height 5\' 8"  (1.727 m), weight 200 lb (90.719 kg), SpO2 100.00%. General: Well developed, well nourished, alert and oriented, in no acute distress. HEENT: Normocephalic, atraumatic, sclera non-icteric, no xanthomas, nares are without discharge.  Neck: Supple. Carotids 2+ without bruits. JVP normal Lungs: Clear bilaterally to auscultation without wheezes, rales, or rhonchi. Breathing is unlabored. Heart: RRR with normal S1 and S2. No murmurs, rubs, or gallops appreciated. Abdomen: Soft, non-tender, non-distended with normoactive bowel sounds. No hepatomegaly. No rebound/guarding. No obvious abdominal masses. Back: No CVA tenderness Msk:  Strength and tone appear normal for age. Extremities: No clubbing, cyanosis, or edema.  Distal pedal pulses are 2+ and equal bilaterally. Neuro: CNII-XII intact, moves all extremities spontaneously. Psych:  Responds to questions appropriately with a normal affect.   Labs:   Lab Results  Component Value Date   WBC 7.0 11/13/2013   HGB 13.3 11/13/2013   HCT 39.8 11/13/2013   MCV 81.1 11/13/2013   PLT 197.0 11/13/2013    Recent Labs Lab 11/13/13 1253  NA 138  K 4.0  CL 105  CO2 24  BUN 18  CREATININE 0.9  CALCIUM 9.2  GLUCOSE 92   No results found for this basename: CKTOTAL, CKMB, TROPONINI,  in the last 72 hours No results found for this basename: CHOL, HDL, LDLCALC, TRIG   No results found for this basename: DDIMER    Radiology/Studies:  No results found.   GXT 11/13/2013: Exercise Treadmill Test  Pre-Exercise Testing Evaluation  Rhythm: normal sinus  Rate: 63   Test  Exercise Tolerance Test  Ordering MD: Angelina Sheriff, MD  Interpreting MD: Angelina Sheriff, MD   Unique Test No: 1  Treadmill: 1   Indication for ETT: known ASHD  Contraindication to  ETT: No   Stress Modality: exercise - treadmill  Cardiac Imaging Performed: non   Protocol: standard Bruce - maximal  Max BP: 152/67   Max MPHR (bpm): 155  85% MPR (bpm): 133   MPHR obtained (bpm): 110  % MPHR obtained: 75   Reached 85% MPHR (min:sec): NA  Total Exercise Time (min-sec): 4:57   Workload in METS: 6.9  Borg Scale: 13   Reason ETT Terminated: downsloping ST depression    ST Segment Analysis  At Rest: non-specific ST segment slurring  With Exercise: significant ischemic ST depression  Other Information  Arrhythmia: No Angina during ETT: absent (0)  Quality of ETT: diagnostic  ETT Interpretation: abnormal - evidence of ST depression consistent with ischemia  Comments:  The patient had a markedly abnormal exercise treadmill with 3 mm ST depression  in downsloping in the inferior and lateral leads. He did not have chest discomfort however.  Recommendations:  Early positive exercise treadmill test with a high pretest probability of obstructive coronary disease. He will have cardiac catheterization. The patient understands that risks included but are not limited to stroke (1 in 1000), death (1 in 1000), kidney failure [usually temporary] (1 in 500), bleeding (1 in 200), allergic reaction [possibly serious] (1 in 200). The patient understands and agrees to proceed.   ASSESSMENT AND PLAN:  CAD with high-risk stress ECG study. Plan cardiac cath and possible PCI. Risks, indications, alternatives reviewed with patient and wife who understand and agree to proceed.  Enzo Bi  11/17/2013, 7:56 AM

## 2013-11-21 ENCOUNTER — Telehealth: Payer: Self-pay | Admitting: Cardiology

## 2013-11-21 NOTE — Telephone Encounter (Signed)
New Message   Pt called--- states that during his cath with Dr. Excell Seltzer he was advised to begin Plavix// pt requests a call back to discuss when he should begin this medication.

## 2013-11-21 NOTE — Telephone Encounter (Signed)
Per wife - Dr Excell Seltzer had wanted pt to start Plavix for medical treatment of CAD found on recent cath but due to pts extensive stomach surgery r/t perforated ulcer decided to let Dr Antoine Poche make the decision as to whether he should take it or not.  Wife aware I will discuss with MD and call her back with instructions and any orders.

## 2013-11-25 NOTE — Telephone Encounter (Signed)
I have reviewed the GI notes and I would not be inclined to start the Plavix at this point.

## 2013-11-27 ENCOUNTER — Encounter: Payer: Self-pay | Admitting: Physician Assistant

## 2013-11-27 ENCOUNTER — Ambulatory Visit (INDEPENDENT_AMBULATORY_CARE_PROVIDER_SITE_OTHER): Payer: Medicare Other | Admitting: Physician Assistant

## 2013-11-27 VITALS — BP 132/70 | HR 80 | Ht 69.0 in | Wt 209.0 lb

## 2013-11-27 DIAGNOSIS — E785 Hyperlipidemia, unspecified: Secondary | ICD-10-CM

## 2013-11-27 DIAGNOSIS — K219 Gastro-esophageal reflux disease without esophagitis: Secondary | ICD-10-CM

## 2013-11-27 DIAGNOSIS — I10 Essential (primary) hypertension: Secondary | ICD-10-CM

## 2013-11-27 DIAGNOSIS — I2510 Atherosclerotic heart disease of native coronary artery without angina pectoris: Secondary | ICD-10-CM

## 2013-11-27 MED ORDER — NITROGLYCERIN 0.4 MG/SPRAY TL SOLN: 1.0000 | Status: DC | PRN

## 2013-11-27 NOTE — Patient Instructions (Addendum)
Your physician wants you to follow-up in: 12 MONTHS WITH DR [facility]  ( Patient preference to f/u in 12 months instead of 6 months ). You will receive a reminder letter in the mail two months in advance. If you don't receive a letter, please call our office to schedule the follow-up appointment.  Ntg spray sent to [facility]

## 2013-11-27 NOTE — Progress Notes (Addendum)
[address] [place], [place]  [address] Phone: [phone] Fax:  [phone]  Date:  11/27/2013   ID:  Daniel Colon, DOB 1946/05/26, MRN [id number]  PCP:  Garlan Fillers, MD  Cardiologist:  Dr. Rollene Rotunda     History of Present Illness: Daniel Colon is a 67 y.o. male with a hx of CAD, s/p stent to RCA in 1996 in [place], HL, GERD.  He established with Dr. Rollene Rotunda 09/2013.  He was set up for a routine ETT for follow up.  ETT(11/13/13): abnormal with early ST changes to suggest ischemia. He was set up for [facility].  LHC (11/17/13):  pLAD 30-40, D1 30, mLAD 70-75, mCFX 70, mRCA stent 40 (ISR), EF 55-65%.  Medical Rx recommended.  He is doing well. He has occasional atypical chest pain. He has experienced this for years. There have been no significant changes. He denies significant dyspnea. He is NYHA class II. He denies orthopnea or PND. He has some mild dependent LE edema. He denies syncope.   Recent Labs: 11/13/2013: Creatinine 0.9; Hemoglobin 13.3; Potassium 4.0   Wt Readings from Last 3 Encounters:  11/27/13 209 lb (94.802 kg)  11/17/13 200 lb (90.719 kg)  11/17/13 200 lb (90.719 kg)     Past Medical History  Diagnosis Date  . Dyslipidemia   . PUD (peptic ulcer disease)   . Cataract   . Nephrolithiasis   . Barrett's esophagus   . Colon polyps   . GERD (gastroesophageal reflux disease)   . CAD (coronary artery disease)     a. Stent to the RCA 1996 (in [place]);  b. ETT(11/13/13): abnormal with early ST changes to suggest ischemia;   c. LHC (11/17/13):  pLAD 30-40, D1 30, mLAD 70-75, mCFX 70, mRCA stent 40 (ISR), EF 55-65%.  Medical Rx    Current Outpatient Prescriptions  Medication Sig Dispense Refill  . carvedilol (COREG) 12.5 MG tablet Take 12.5 mg by mouth 2 (two) times daily with a meal.      . Coenzyme Q10 (CO Q-10) 100 MG CAPS Take 200 mg by mouth daily.      . fish oil-omega-3 fatty acids 1000 MG capsule Take 2 g by mouth daily.      . Multiple  Vitamins-Minerals (MULTIVITAMIN PO) Take 1 tablet by mouth daily.      Marland Kitchen omeprazole (PRILOSEC) 10 MG capsule Take 10 mg by mouth daily.      . Oxymetazoline HCl (NASAL SPRAY NA) Place into the nose daily.      . simvastatin (ZOCOR) 40 MG tablet Take 40 mg by mouth every evening.      Marland Kitchen telmisartan-hydrochlorothiazide (MICARDIS HCT) 80-12.5 MG per tablet Take 1 tablet by mouth daily.      . Tetrahydrozoline HCl (VISINE OP) Apply 1 drop to eye daily as needed (for dry eyes).       No current facility-administered medications for this visit.    Allergies:   Review of patient's allergies indicates no known allergies.   Social History:  The patient  reports that he quit smoking about 2 years ago. His smoking use included Cigarettes. He has a 42 pack-year smoking history. His smokeless tobacco use includes Chew. He reports that he drinks alcohol. He reports that he does not use illicit drugs.   Family History:  The patient's family history includes CAD (age of onset: 79) in his sister; CAD (age of onset: 62) in his brother and sister; Cancer in his brother; Liver disease  in an other family member; Sudden death (age of onset: 87) in his mother.   ROS:  Please see the history of present illness.      All other systems reviewed and negative.   PHYSICAL EXAM: VS:  BP 132/70  Pulse 80  Ht 5\' 9"  (1.753 m)  Wt 209 lb (94.802 kg)  BMI 30.85 kg/m2 Well nourished, well developed, in no acute distress HEENT: normal Neck: no JVD Cardiac:  normal S1, S2; RRR; no murmur Lungs:  clear to auscultation bilaterally, no wheezing, rhonchi or rales Abd: soft, nontender, no hepatomegaly Ext: no edema; right wrist without hematoma or mass  Skin: warm and dry Neuro:  CNs 2-12 intact, no focal abnormalities noted  EKG:  NSR, HR 80, normal axis, nonspecific ST-T wave changes with diffuse ST segment scooping     ASSESSMENT AND PLAN:  1. CAD:  No angina. He had a false positive exercise treadmill test. Continue  current therapy with beta blocker and statin. He is not on an aspirin due to history of significant peptic ulcer disease, status post Billroth II procedure and Barrett's esophagus. I will review with his gastroenterologist to see if it is acceptable for him to take aspirin. Dr. Antoine Poche has previously reviewed his chart and recommended against taking Plavix. 2. Hypertension:  Controlled. Continue current therapy. 3. Hyperlipidemia:  Continue statin. Labs are followed by his PCP. 4. GERD:  Continue follow up with gastroenterology. 5. Disposition:  I recommended follow up with Dr. Antoine Poche in 6 months. The patient preferred to follow up in 1 year.  Signed, Tereso Newcomer, PA-C  11/27/2013 11:33 AM    Addendum: I reviewed with his gastroenterologist. He may take aspirin 81 mg daily. He has been advised to remain on PPI indefinitely. Signed, Tereso Newcomer, PA-C   11/27/2013 4:49 PM

## 2013-11-28 ENCOUNTER — Telehealth: Payer: Self-pay | Admitting: *Deleted

## 2013-11-28 MED ORDER — ASPIRIN EC 81 MG PO TBEC: 81.0000 mg | DELAYED_RELEASE_TABLET | Freq: Every day | ORAL | Status: DC

## 2013-11-28 NOTE — Telephone Encounter (Signed)
pt notified per Dr. Rhea Belton and Tereso Newcomer, PA to start ASA 81 mg qd, make sure to take omeprazole daily. Pt verblized understanding to all instructions.

## 2013-11-28 NOTE — Telephone Encounter (Signed)
Susan aware.

## 2014-08-14 DIAGNOSIS — H25049 Posterior subcapsular polar age-related cataract, unspecified eye: Secondary | ICD-10-CM | POA: Diagnosis not present

## 2014-08-15 ENCOUNTER — Telehealth: Payer: Self-pay | Admitting: Pharmacist

## 2014-08-15 NOTE — Telephone Encounter (Signed)
Received a letter from [facility] stating patient is enrolled into the [facility] which is a study using a PCSK-9 inhibitor called bococizumab.  He was enrolled 09/2013 and the study will be a 1 year double blind placebo controlled trial.    FYI to Dr. Percival Spanish

## 2014-08-24 DIAGNOSIS — Z125 Encounter for screening for malignant neoplasm of prostate: Secondary | ICD-10-CM | POA: Diagnosis not present

## 2014-08-24 DIAGNOSIS — I10 Essential (primary) hypertension: Secondary | ICD-10-CM | POA: Diagnosis not present

## 2014-08-24 DIAGNOSIS — E785 Hyperlipidemia, unspecified: Secondary | ICD-10-CM | POA: Diagnosis not present

## 2014-08-31 DIAGNOSIS — I259 Chronic ischemic heart disease, unspecified: Secondary | ICD-10-CM | POA: Diagnosis not present

## 2014-08-31 DIAGNOSIS — I10 Essential (primary) hypertension: Secondary | ICD-10-CM | POA: Diagnosis not present

## 2014-08-31 DIAGNOSIS — L989 Disorder of the skin and subcutaneous tissue, unspecified: Secondary | ICD-10-CM | POA: Diagnosis not present

## 2014-08-31 DIAGNOSIS — E785 Hyperlipidemia, unspecified: Secondary | ICD-10-CM | POA: Diagnosis not present

## 2014-08-31 DIAGNOSIS — Z0000 Encounter for general adult medical examination without abnormal findings: Secondary | ICD-10-CM | POA: Diagnosis not present

## 2014-08-31 DIAGNOSIS — N529 Male erectile dysfunction, unspecified: Secondary | ICD-10-CM | POA: Diagnosis not present

## 2014-08-31 DIAGNOSIS — H9190 Unspecified hearing loss, unspecified ear: Secondary | ICD-10-CM | POA: Diagnosis not present

## 2014-08-31 DIAGNOSIS — M25469 Effusion, unspecified knee: Secondary | ICD-10-CM | POA: Diagnosis not present

## 2014-09-06 DIAGNOSIS — H903 Sensorineural hearing loss, bilateral: Secondary | ICD-10-CM | POA: Diagnosis not present

## 2014-09-07 DIAGNOSIS — L821 Other seborrheic keratosis: Secondary | ICD-10-CM | POA: Diagnosis not present

## 2014-09-07 DIAGNOSIS — L570 Actinic keratosis: Secondary | ICD-10-CM | POA: Diagnosis not present

## 2014-09-07 DIAGNOSIS — D2360 Other benign neoplasm of skin of unspecified upper limb, including shoulder: Secondary | ICD-10-CM | POA: Diagnosis not present

## 2014-09-07 DIAGNOSIS — L819 Disorder of pigmentation, unspecified: Secondary | ICD-10-CM | POA: Diagnosis not present

## 2014-09-07 DIAGNOSIS — L738 Other specified follicular disorders: Secondary | ICD-10-CM | POA: Diagnosis not present

## 2014-09-07 DIAGNOSIS — L723 Sebaceous cyst: Secondary | ICD-10-CM | POA: Diagnosis not present

## 2014-09-07 DIAGNOSIS — D1801 Hemangioma of skin and subcutaneous tissue: Secondary | ICD-10-CM | POA: Diagnosis not present

## 2014-09-10 DIAGNOSIS — Z1212 Encounter for screening for malignant neoplasm of rectum: Secondary | ICD-10-CM | POA: Diagnosis not present

## 2014-09-14 DIAGNOSIS — Z23 Encounter for immunization: Secondary | ICD-10-CM | POA: Diagnosis not present

## 2014-09-27 ENCOUNTER — Other Ambulatory Visit: Payer: Self-pay | Admitting: Physician Assistant

## 2014-10-08 DIAGNOSIS — H179 Unspecified corneal scar and opacity: Secondary | ICD-10-CM | POA: Diagnosis not present

## 2014-10-08 DIAGNOSIS — H2512 Age-related nuclear cataract, left eye: Secondary | ICD-10-CM | POA: Diagnosis not present

## 2014-10-08 DIAGNOSIS — H25042 Posterior subcapsular polar age-related cataract, left eye: Secondary | ICD-10-CM | POA: Diagnosis not present

## 2014-10-08 DIAGNOSIS — H52202 Unspecified astigmatism, left eye: Secondary | ICD-10-CM | POA: Diagnosis not present

## 2014-10-08 DIAGNOSIS — H2511 Age-related nuclear cataract, right eye: Secondary | ICD-10-CM | POA: Diagnosis not present

## 2014-10-08 DIAGNOSIS — H25012 Cortical age-related cataract, left eye: Secondary | ICD-10-CM | POA: Diagnosis not present

## 2014-11-06 DIAGNOSIS — H25042 Posterior subcapsular polar age-related cataract, left eye: Secondary | ICD-10-CM | POA: Diagnosis not present

## 2014-11-06 DIAGNOSIS — H2512 Age-related nuclear cataract, left eye: Secondary | ICD-10-CM | POA: Diagnosis not present

## 2014-11-29 ENCOUNTER — Encounter (HOSPITAL_COMMUNITY): Payer: Self-pay | Admitting: Cardiovascular Disease

## 2014-12-24 ENCOUNTER — Ambulatory Visit (INDEPENDENT_AMBULATORY_CARE_PROVIDER_SITE_OTHER): Payer: Medicare Other | Admitting: Cardiology

## 2014-12-24 ENCOUNTER — Encounter: Payer: Self-pay | Admitting: Cardiology

## 2014-12-24 VITALS — BP 130/68 | HR 59 | Ht 69.0 in | Wt 204.6 lb

## 2014-12-24 DIAGNOSIS — I2510 Atherosclerotic heart disease of native coronary artery without angina pectoris: Secondary | ICD-10-CM | POA: Diagnosis not present

## 2014-12-24 DIAGNOSIS — I2583 Coronary atherosclerosis due to lipid rich plaque: Principal | ICD-10-CM

## 2014-12-24 MED ORDER — ROSUVASTATIN CALCIUM 20 MG PO TABS: 20.0000 mg | ORAL_TABLET | Freq: Every day | ORAL | Status: DC

## 2014-12-24 NOTE — Patient Instructions (Addendum)
Your physician recommends that you schedule a follow-up appointment in: one year with Dr. Percival Spanish  We are switching you to Crestor as your cholesterol medication

## 2014-12-24 NOTE — Progress Notes (Signed)
HPI The patient presents for follow up of  CAD, s/p stent to RCA in 1996 in [place].  RoutineETT(11/13/13) was abnormal with early ST changes to suggest ischemia. He was set up had LHC (11/17/13): pLAD 30-40, D1 30, mLAD 70-75, mCFX 70, mRCA stent 40 (ISR), EF 55-65%.  He was managed medically.  Since that time he has done well. He is exercising with his wife 3 times per week.   The patient denies any new symptoms such as chest discomfort, neck or arm discomfort. There has been no new shortness of breath, PND or orthopnea. There have been no reported palpitations, presyncope or syncope.  No Known Allergies  Current Outpatient Prescriptions  Medication Sig Dispense Refill  . aspirin EC 81 MG tablet Take 1 tablet (81 mg total) by mouth daily.    . carvedilol (COREG) 12.5 MG tablet Take 12.5 mg by mouth 2 (two) times daily with a meal.    . Coenzyme Q10 (CO Q-10) 100 MG CAPS Take 200 mg by mouth daily.    . fish oil-omega-3 fatty acids 1000 MG capsule Take 2 g by mouth daily.    . Multiple Vitamins-Minerals (MULTIVITAMIN PO) Take 1 tablet by mouth daily.    . nitroGLYCERIN (NITROLINGUAL) 0.4 MG/SPRAY spray SPRAY 1 SPRAY UNDER THE TONGUE EVERY 5 MINUTES FOR 3 DOSES AS NEEDED FOR CHEST PAIN 25 g 2  . omeprazole (PRILOSEC) 10 MG capsule Take 10 mg by mouth daily.    . Oxymetazoline HCl (NASAL SPRAY NA) Place into the nose daily.    . simvastatin (ZOCOR) 40 MG tablet Take 40 mg by mouth every evening.    Marland Kitchen telmisartan-hydrochlorothiazide (MICARDIS HCT) 80-12.5 MG per tablet Take 1 tablet by mouth daily.     No current facility-administered medications for this visit.    Past Medical History  Diagnosis Date  . Dyslipidemia   . PUD (peptic ulcer disease)   . Cataract   . Nephrolithiasis   . Barrett's esophagus   . Colon polyps   . GERD (gastroesophageal reflux disease)   . CAD (coronary artery disease)     a. Stent to the [place] (in [place]);  b. ETT(11/13/13): abnormal with early ST  changes to suggest ischemia;   c. LHC (11/17/13):  pLAD 30-40, D1 30, mLAD 70-75, mCFX 70, mRCA stent 40 (ISR), EF 55-65%.  Medical Rx    Past Surgical History  Procedure Laterality Date  . Bilroth ii procedure    . Angioplasty      stent - rt cor. art  . Ulcer surgery    . Left heart catheterization with coronary angiogram N/A 11/17/2013    Procedure: LEFT HEART CATHETERIZATION WITH CORONARY ANGIOGRAM;  Surgeon: Blane Ohara, MD;  Location: [facility] CATH LAB;  Service: Cardiovascular;  Laterality: N/A;    ROS:  Bruises.  Otherwise astated in the HPI and negative for all other systems.  PHYSICAL EXAM BP 130/68 mmHg  Pulse 59  Ht 5\' 9"  (1.753 m)  Wt 204 lb 9.6 oz (92.806 kg)  BMI 30.20 kg/m2 GENERAL:  Well appearing HEENT:  Pupils equal round and reactive, fundi not visualized, oral mucosa unremarkable NECK:  No jugular venous distention, waveform within normal limits, carotid upstroke brisk and symmetric, no bruits, no thyromegaly LYMPHATICS:  No cervical, inguinal adenopathy LUNGS:  Clear to auscultation bilaterally BACK:  No CVA tenderness CHEST:  Unremarkable HEART:  PMI not displaced or sustained,S1 and S2 within normal limits, no S3, no S4, no clicks, no  rubs, no  murmurs ABD:  Flat, positive bowel sounds normal in frequency in pitch, no bruits, no rebound, no guarding, no midline pulsatile mass, no hepatomegaly, no splenomegaly, abdominal scar well healed. EXT:  2 plus pulses throughout, no edema, no cyanosis no clubbing SKIN:  No rashes no nodules NEURO:  Cranial nerves II through XII grossly intact, motor grossly intact throughout PSYCH:  Cognitively intact, oriented to person place and time   EKG:    Sinus rhythm, rate 60, axis within normal limits, intervals within normal limits, no acute ST-T wave changes.  He did have some ST depression mildly in nonspecific in the inferolateral leads. 12/24/2014  ASSESSMENT AND PLAN    CAD: The patient has no new sypmtoms.  No further  cardiovascular testing is indicated.  We will continue with aggressive risk reduction and meds as listed.  DYSLIPIDEMIA:  I did review some records from [facility] G, [place].  His LDL was 98.  With his degree of vascular disease I would suggest that the goal should be an LDL less than 70. I have switched him to Crestor 20 mg daily. I will send him back to his primary care physician in about 10 weeks for followup labs.

## 2014-12-27 ENCOUNTER — Telehealth: Payer: Self-pay | Admitting: *Deleted

## 2014-12-27 ENCOUNTER — Encounter: Payer: Self-pay | Admitting: Cardiology

## 2014-12-27 ENCOUNTER — Other Ambulatory Visit: Payer: Self-pay | Admitting: Cardiology

## 2014-12-27 NOTE — Telephone Encounter (Signed)
PA for Crestor sent via CoverMyMeds

## 2014-12-27 NOTE — Telephone Encounter (Signed)
Needs authorization for his Crestor 20 mg please. Please call to [facility] with authorization.Marland Kitchen

## 2014-12-27 NOTE — Telephone Encounter (Signed)
PA for Crestor denied, faxing list of alternative medications to Dr. Percival Spanish for further evaluation.

## 2014-12-31 ENCOUNTER — Telehealth: Payer: Self-pay | Admitting: *Deleted

## 2014-12-31 DIAGNOSIS — E785 Hyperlipidemia, unspecified: Secondary | ICD-10-CM

## 2014-12-31 NOTE — Telephone Encounter (Signed)
Patient is needing an alternative for crestor. Atorvastatin, fluvastatin,lovastatin, pravastatin and simvastatin are preferred. Will forward for dr hochrein's review.

## 2015-01-04 MED ORDER — ATORVASTATIN CALCIUM 40 MG PO TABS: 40.0000 mg | ORAL_TABLET | Freq: Every day | ORAL | Status: DC

## 2015-01-04 NOTE — Telephone Encounter (Signed)
I would suggest Lipitor 40 mg and then repeat a lipid and liver in 10 weeks.  Call Mr. Ropp with the results

## 2015-01-04 NOTE — Telephone Encounter (Signed)
Spoke with pt, Patient voiced understanding of medication change and need for repeat lab work. [facility] and lab orders mailed to patients confirmed home address.

## 2015-03-22 DIAGNOSIS — E785 Hyperlipidemia, unspecified: Secondary | ICD-10-CM | POA: Diagnosis not present

## 2015-09-06 DIAGNOSIS — Z125 Encounter for screening for malignant neoplasm of prostate: Secondary | ICD-10-CM | POA: Diagnosis not present

## 2015-09-06 DIAGNOSIS — E785 Hyperlipidemia, unspecified: Secondary | ICD-10-CM | POA: Diagnosis not present

## 2015-09-06 DIAGNOSIS — I10 Essential (primary) hypertension: Secondary | ICD-10-CM | POA: Diagnosis not present

## 2015-09-13 DIAGNOSIS — Z9861 Coronary angioplasty status: Secondary | ICD-10-CM | POA: Diagnosis not present

## 2015-09-13 DIAGNOSIS — E669 Obesity, unspecified: Secondary | ICD-10-CM | POA: Diagnosis not present

## 2015-09-13 DIAGNOSIS — K219 Gastro-esophageal reflux disease without esophagitis: Secondary | ICD-10-CM | POA: Diagnosis not present

## 2015-09-13 DIAGNOSIS — Z23 Encounter for immunization: Secondary | ICD-10-CM | POA: Diagnosis not present

## 2015-09-13 DIAGNOSIS — E291 Testicular hypofunction: Secondary | ICD-10-CM | POA: Diagnosis not present

## 2015-09-13 DIAGNOSIS — E785 Hyperlipidemia, unspecified: Secondary | ICD-10-CM | POA: Diagnosis not present

## 2015-09-13 DIAGNOSIS — Z0000 Encounter for general adult medical examination without abnormal findings: Secondary | ICD-10-CM | POA: Diagnosis not present

## 2015-09-13 DIAGNOSIS — M25462 Effusion, left knee: Secondary | ICD-10-CM | POA: Diagnosis not present

## 2015-09-13 DIAGNOSIS — I10 Essential (primary) hypertension: Secondary | ICD-10-CM | POA: Diagnosis not present

## 2015-09-13 DIAGNOSIS — Z6830 Body mass index (BMI) 30.0-30.9, adult: Secondary | ICD-10-CM | POA: Diagnosis not present

## 2015-09-13 DIAGNOSIS — K2270 Barrett's esophagus without dysplasia: Secondary | ICD-10-CM | POA: Diagnosis not present

## 2015-09-13 DIAGNOSIS — Z1389 Encounter for screening for other disorder: Secondary | ICD-10-CM | POA: Diagnosis not present

## 2015-09-16 ENCOUNTER — Telehealth: Payer: Self-pay | Admitting: Internal Medicine

## 2015-09-16 ENCOUNTER — Encounter: Payer: Self-pay | Admitting: Internal Medicine

## 2015-09-16 NOTE — Telephone Encounter (Signed)
Pt was due for repeat colon in May due to history of adenomatous polyps. Please schedule pt for colon.

## 2015-09-17 DIAGNOSIS — Z1212 Encounter for screening for malignant neoplasm of rectum: Secondary | ICD-10-CM | POA: Diagnosis not present

## 2015-10-03 DIAGNOSIS — G4700 Insomnia, unspecified: Secondary | ICD-10-CM | POA: Diagnosis not present

## 2015-10-29 ENCOUNTER — Ambulatory Visit (AMBULATORY_SURGERY_CENTER): Payer: Self-pay

## 2015-10-29 VITALS — Ht 69.0 in | Wt 205.6 lb

## 2015-10-29 DIAGNOSIS — Z860100 Personal history of colon polyps, unspecified: Secondary | ICD-10-CM

## 2015-10-29 MED ORDER — SUPREP BOWEL PREP KIT 17.5-3.13-1.6 GM/177ML PO SOLN: 1.0000 | Freq: Once | ORAL | Status: DC

## 2015-10-29 NOTE — Progress Notes (Signed)
No allergies to eggs or soy No diet/weight loss meds No home oxygen No past problems with anesthesia  Has email and internet; refused emmi 

## 2015-11-11 ENCOUNTER — Encounter: Payer: Self-pay | Admitting: Internal Medicine

## 2015-11-11 ENCOUNTER — Encounter: Payer: Medicare Other | Admitting: Internal Medicine

## 2015-11-11 ENCOUNTER — Ambulatory Visit (AMBULATORY_SURGERY_CENTER): Payer: Medicare Other | Admitting: Internal Medicine

## 2015-11-11 VITALS — BP 131/71 | HR 55 | Temp 97.1°F | Resp 15 | Ht 69.0 in | Wt 205.0 lb

## 2015-11-11 DIAGNOSIS — K635 Polyp of colon: Secondary | ICD-10-CM | POA: Diagnosis not present

## 2015-11-11 DIAGNOSIS — Z86010 Personal history of colonic polyps: Secondary | ICD-10-CM | POA: Diagnosis not present

## 2015-11-11 DIAGNOSIS — D125 Benign neoplasm of sigmoid colon: Secondary | ICD-10-CM

## 2015-11-11 DIAGNOSIS — D123 Benign neoplasm of transverse colon: Secondary | ICD-10-CM

## 2015-11-11 DIAGNOSIS — I252 Old myocardial infarction: Secondary | ICD-10-CM | POA: Diagnosis not present

## 2015-11-11 DIAGNOSIS — I2510 Atherosclerotic heart disease of native coronary artery without angina pectoris: Secondary | ICD-10-CM | POA: Diagnosis not present

## 2015-11-11 MED ORDER — SODIUM CHLORIDE 0.9 % IV SOLN: 500.0000 mL | INTRAVENOUS | Status: DC

## 2015-11-11 NOTE — Progress Notes (Signed)
A/ox3, pleased with MAC, report to RN 

## 2015-11-11 NOTE — Patient Instructions (Signed)
YOU HAD AN ENDOSCOPIC PROCEDURE TODAY AT THE [facility]:   Refer to the procedure report that was given to you for any specific questions about what was found during the examination.  If the procedure report does not answer your questions, please call your gastroenterologist to clarify.  If you requested that your care partner not be given the details of your procedure findings, then the procedure report has been included in a sealed envelope for you to review at your convenience later.  YOU SHOULD EXPECT: Some feelings of bloating in the abdomen. Passage of more gas than usual.  Walking can help get rid of the air that was put into your GI tract during the procedure and reduce the bloating. If you had a lower endoscopy (such as a colonoscopy or flexible sigmoidoscopy) you may notice spotting of blood in your stool or on the toilet paper. If you underwent a bowel prep for your procedure, you may not have a normal bowel movement for a few days.  Please Note:  You might notice some irritation and congestion in your nose or some drainage.  This is from the oxygen used during your procedure.  There is no need for concern and it should clear up in a day or so.  SYMPTOMS TO REPORT IMMEDIATELY:   Following lower endoscopy (colonoscopy or flexible sigmoidoscopy):  Excessive amounts of blood in the stool  Significant tenderness or worsening of abdominal pains  Swelling of the abdomen that is new, acute  Fever of 100F or higher   For urgent or emergent issues, a gastroenterologist can be reached at any hour by calling [phone].   DIET: Your first meal following the procedure should be a small meal and then it is ok to progress to your normal diet. Heavy or fried foods are harder to digest and may make you feel nauseous or bloated.  Likewise, meals heavy in dairy and vegetables can increase bloating.  Drink plenty of fluids but you should avoid alcoholic beverages for 24  hours.  ACTIVITY:  You should plan to take it easy for the rest of today and you should NOT DRIVE or use heavy machinery until tomorrow (because of the sedation medicines used during the test).    FOLLOW UP: Our staff will call the number listed on your records the next business day following your procedure to check on you and address any questions or concerns that you may have regarding the information given to you following your procedure. If we do not reach you, we will leave a message.  However, if you are feeling well and you are not experiencing any problems, there is no need to return our call.  We will assume that you have returned to your regular daily activities without incident.  If any biopsies were taken you will be contacted by phone or by letter within the next 1-3 weeks.  Please call us at [phone] if you have not heard about the biopsies in 3 weeks.    SIGNATURES/CONFIDENTIALITY: You and/or your care partner have signed paperwork which will be entered into your electronic medical record.  These signatures attest to the fact that that the information above on your After Visit Summary has been reviewed and is understood.  Full responsibility of the confidentiality of this discharge information lies with you and/or your care-partner. 

## 2015-11-11 NOTE — Progress Notes (Signed)
Called to room to assist during endoscopic procedure.  Patient ID and intended procedure confirmed with present staff. Received instructions for my participation in the procedure from the performing physician.  

## 2015-11-11 NOTE — Op Note (Signed)
[place] [facility]  [address] [place], [address]   COLONOSCOPY PROCEDURE REPORT  PATIENT: Daniel, Colon  MR#: [id number] BIRTHDATE: 02/01/1946 , 33  yrs. old GENDER: male ENDOSCOPIST: Jerene Bears, MD REFERRED UT:MLYYTK Philip Aspen, M.D. PROCEDURE DATE:  11/11/2015 PROCEDURE:   Colonoscopy, surveillance and Colonoscopy with snare polypectomy First Screening Colonoscopy - Avg.  risk and is 50 yrs.  old or older - No.  Prior Negative Screening - Now for repeat screening. N/A  History of Adenoma - Now for follow-up colonoscopy & has been > or = to 3 yrs.  Yes hx of adenoma.  Has been 3 or more years since last colonoscopy.  Polyps removed today? Yes ASA CLASS:   Class II INDICATIONS:Surveillance due to prior colonic neoplasia.   Personal history of colonic polyps (2011 [place]) MEDICATIONS: Monitored anesthesia care and Propofol 250 mg IV  DESCRIPTION OF PROCEDURE:   After the risks benefits and alternatives of the procedure were thoroughly explained, informed consent was obtained.  The digital rectal exam revealed no rectal mass.   The LB [id number]-[id number]  endoscope was introduced through the anus and advanced to the cecum, which was identified by both the appendix and ileocecal valve. No adverse events experienced. The quality of the prep was good.  (Suprep was used)  The instrument was then slowly withdrawn as the colon was fully examined. Estimated blood loss is zero unless otherwise noted in this procedure report.  COLON FINDINGS: Two sessile polyps ranging between 3-31m in size were found in the transverse colon and sigmoid colon. Polypectomies were performed with a cold snare.  The resection was complete, the polyp tissue was completely retrieved and sent to histology.   There was mild diverticulosis noted in the descending colon.  Retroflexed views revealed internal hemorrhoids. The time to cecum = 2.8 Withdrawal time = 15.1   The scope was withdrawn and the  procedure completed. COMPLICATIONS: There were no immediate complications.  ENDOSCOPIC IMPRESSION: 1.   Two sessile polyps ranging between 3-562min size were found in the transverse colon and sigmoid colon; polypectomies were performed with a cold snare 2.   Mild diverticulosis was noted in the descending colon  RECOMMENDATIONS: 1.  Await pathology results 2.  High fiber diet 3.  If the polyps removed today are proven to be adenomatous (pre-cancerous) polyps, you will need a repeat colonoscopy in 5 years.  Otherwise you should continue to follow colorectal cancer screening guidelines for "routine risk" patients with colonoscopy in 10 years.  You will receive a letter within 1-2 weeks with the results of your biopsy as well as final recommendations.  Please call my office if you have not received a letter after 3 weeks.  eSigned:  JaJerene BearsMD 11/11/2015 9:06 AM  cc: DaLeanna BattlesMD and The Patient

## 2015-11-12 ENCOUNTER — Telehealth: Payer: Self-pay

## 2015-11-12 NOTE — Telephone Encounter (Signed)
  Follow up Call-  Call back number 11/11/2015 11/02/2013  Post procedure Call Back phone  # [phone] OK TO SPEAK WITH WIFE  Permission to leave phone message Yes Yes     Patient questions:  Do you have a fever, pain , or abdominal swelling? No. Pain Score  0 *  Have you tolerated food without any problems? Yes.    Have you been able to return to your normal activities? Yes.    Do you have any questions about your discharge instructions: Diet   No. Medications  No. Follow up visit  No.  Do you have questions or concerns about your Care? No.  Actions: * If pain score is 4 or above: No action needed, pain <4.

## 2015-11-14 ENCOUNTER — Other Ambulatory Visit: Payer: Self-pay | Admitting: Cardiology

## 2015-11-18 ENCOUNTER — Encounter: Payer: Self-pay | Admitting: Internal Medicine

## 2015-12-06 ENCOUNTER — Encounter: Payer: Self-pay | Admitting: Acute Care

## 2015-12-09 ENCOUNTER — Other Ambulatory Visit: Payer: Self-pay | Admitting: Acute Care

## 2015-12-09 DIAGNOSIS — Z87891 Personal history of nicotine dependence: Secondary | ICD-10-CM

## 2015-12-11 ENCOUNTER — Encounter: Payer: Medicare Other | Admitting: Acute Care

## 2015-12-11 ENCOUNTER — Ambulatory Visit
Admission: RE | Admit: 2015-12-11 | Discharge: 2015-12-11 | Disposition: A | Discharge status: Home / Self Care | Payer: Medicare Other | Source: Ambulatory Visit | Attending: Acute Care | Admitting: Acute Care

## 2015-12-11 DIAGNOSIS — Z87891 Personal history of nicotine dependence: Secondary | ICD-10-CM | POA: Diagnosis not present

## 2015-12-12 ENCOUNTER — Other Ambulatory Visit: Payer: Self-pay | Admitting: Cardiology

## 2015-12-12 NOTE — Telephone Encounter (Signed)
Rx(s) sent to pharmacy electronically.  

## 2015-12-30 ENCOUNTER — Ambulatory Visit: Payer: TRICARE For Life (TFL) | Admitting: Cardiology

## 2015-12-30 ENCOUNTER — Encounter: Payer: Self-pay | Admitting: Cardiology

## 2016-01-09 ENCOUNTER — Encounter: Payer: Self-pay | Admitting: Cardiology

## 2016-01-31 ENCOUNTER — Ambulatory Visit: Payer: TRICARE For Life (TFL) | Admitting: Cardiology

## 2016-02-02 NOTE — Progress Notes (Signed)
HPI The patient presents for follow up of  CAD, s/p stent to RCA in 1996 in [place].  RoutineETT(11/13/13) was abnormal with early ST changes to suggest ischemia. He was set up had LHC (11/17/13): pLAD 30-40, D1 30, mLAD 70-75, mCFX 70, mRCA stent 40 (ISR), EF 55-65%.  He was managed medically.  Since that time he has done well. He is exercising at the gym 3 times per week.  He also golfs.   The patient denies any new symptoms such as chest discomfort, neck or arm discomfort. There has been no new shortness of breath, PND or orthopnea. There have been no reported palpitations, presyncope or syncope.  Of note he is getting yearly CT scans of his chest because of some mild abnormalities and his smoking history.     No Known Allergies  Current Outpatient Prescriptions  Medication Sig Dispense Refill  . aspirin EC 81 MG tablet Take 1 tablet (81 mg total) by mouth daily.    Marland Kitchen atorvastatin (LIPITOR) 40 MG tablet TAKE 1 TABLET DAILY 90 tablet 3  . carvedilol (COREG) 12.5 MG tablet Take 12.5 mg by mouth 2 (two) times daily with a meal.    . chlorhexidine (PERIDEX) 0.12 % solution daily.  0  . Coenzyme Q10 (CO Q-10) 100 MG CAPS Take 200 mg by mouth daily.    Marland Kitchen ezetimibe (ZETIA) 10 MG tablet Take 10 mg by mouth daily.    . fish oil-omega-3 fatty acids 1000 MG capsule Take 2 g by mouth daily.    Marland Kitchen ipratropium (ATROVENT) 0.03 % nasal spray     . Multiple Vitamins-Minerals (MULTIVITAMIN PO) Take 1 tablet by mouth daily.    . nitroGLYCERIN (NITROLINGUAL) 0.4 MG/SPRAY spray USE 1 SPRAY UNDER THE TONGUE EVERY 5 MINUTES FOR 3 DOSES AS NEEDED FOR CHEST PAIN 24 g 0  . omeprazole (PRILOSEC) 10 MG capsule Take 10 mg by mouth daily.    Marland Kitchen telmisartan-hydrochlorothiazide (MICARDIS HCT) 80-12.5 MG per tablet Take 1 tablet by mouth daily.     No current facility-administered medications for this visit.    Past Medical History  Diagnosis Date  . Dyslipidemia   . PUD (peptic ulcer disease)   . Cataract   .  Nephrolithiasis   . Barrett's esophagus   . Colon polyps   . GERD (gastroesophageal reflux disease)   . CAD (coronary artery disease)     a. Stent to the [place] (in [place]);  b. ETT(11/13/13): abnormal with early ST changes to suggest ischemia;   c. LHC (11/17/13):  pLAD 30-40, D1 30, mLAD 70-75, mCFX 70, mRCA stent 40 (ISR), EF 55-65%.  Medical Rx    Past Surgical History  Procedure Laterality Date  . Bilroth ii procedure    . Angioplasty      stent - rt cor. art  . Ulcer surgery    . Left heart catheterization with coronary angiogram N/A 11/17/2013    Procedure: LEFT HEART CATHETERIZATION WITH CORONARY ANGIOGRAM;  Surgeon: Blane Ohara, MD;  Location: [facility] C[facility] LAB;  Service: Cardiovascular;  Laterality: N/A;    ROS:  Bruises.  Otherwise astated in the HPI and negative for all other systems.  PHYSICAL EXAM BP 124/68 mmHg  Pulse 52  Ht '5\' 9"'$  (1.753 m)  Wt 202 lb 14.4 oz (92.035 kg)  BMI 29.95 kg/m2 GENERAL:  Well appearing HEENT:  Pupils equal round and reactive, fundi not visualized, oral mucosa unremarkable NECK:  No jugular venous distention, waveform within normal limits,  carotid upstroke brisk and symmetric, no bruits, no thyromegaly LUNGS:  Clear to auscultation bilaterally BACK:  No CVA tenderness CHEST:  Unremarkable HEART:  PMI not displaced or sustained,S1 and S2 within normal limits, no S3, no S4, no clicks, no rubs, no  murmurs ABD:  Flat, positive bowel sounds normal in frequency in pitch, no bruits, no rebound, no guarding, no midline pulsatile mass, no hepatomegaly, no splenomegaly, abdominal scar well healed. EXT:  2 plus pulses throughout, no edema, no cyanosis no clubbing   EKG:    Sinus rhythm, rate 52, axis within normal limits, intervals within normal limits, no acute ST-T wave changes.  He did have some ST depression mildly in nonspecific in the inferolateral leads. 02/03/2016  ASSESSMENT AND PLAN    CAD: The patient has no new sypmtoms.  No further  cardiovascular testing is indicated.  We will continue with aggressive risk reduction and meds as listed.  I have suggested 150 minutes per week of moderate physical activity.    DYSLIPIDEMIA:  This is followed by Donnajean Lopes, MD .  I have suggested a goal LDL of 70.    HTN:  BP NL.  Continue current meds.

## 2016-02-03 ENCOUNTER — Encounter: Payer: Self-pay | Admitting: Cardiology

## 2016-02-03 ENCOUNTER — Ambulatory Visit (INDEPENDENT_AMBULATORY_CARE_PROVIDER_SITE_OTHER): Payer: Medicare Other | Admitting: Cardiology

## 2016-02-03 ENCOUNTER — Ambulatory Visit: Payer: TRICARE For Life (TFL) | Admitting: Cardiology

## 2016-02-03 VITALS — BP 124/68 | HR 52 | Ht 69.0 in | Wt 202.9 lb

## 2016-02-03 DIAGNOSIS — I10 Essential (primary) hypertension: Secondary | ICD-10-CM

## 2016-02-03 DIAGNOSIS — I2510 Atherosclerotic heart disease of native coronary artery without angina pectoris: Secondary | ICD-10-CM

## 2016-02-03 DIAGNOSIS — H25042 Posterior subcapsular polar age-related cataract, left eye: Secondary | ICD-10-CM | POA: Diagnosis not present

## 2016-02-03 NOTE — Patient Instructions (Signed)
Dr Percival Spanish recommends that you schedule a follow-up appointment in 1 year. You will receive a reminder letter in the mail two months in advance. If you don't receive a letter, please call our office to schedule the follow-up appointment.  If you need a refill on your cardiac medications before your next appointment, please call your pharmacy.

## 2016-03-11 ENCOUNTER — Other Ambulatory Visit: Payer: Self-pay | Admitting: Acute Care

## 2016-03-11 DIAGNOSIS — Z87891 Personal history of nicotine dependence: Secondary | ICD-10-CM

## 2016-05-14 ENCOUNTER — Other Ambulatory Visit: Payer: Self-pay | Admitting: Cardiology

## 2016-05-14 NOTE — Telephone Encounter (Signed)
Rx(s) sent to pharmacy electronically.  

## 2016-08-03 ENCOUNTER — Encounter: Payer: Self-pay | Admitting: *Deleted

## 2016-08-31 ENCOUNTER — Encounter: Payer: Self-pay | Admitting: Internal Medicine

## 2016-09-11 ENCOUNTER — Encounter: Payer: Self-pay | Admitting: Internal Medicine

## 2016-09-11 DIAGNOSIS — Z125 Encounter for screening for malignant neoplasm of prostate: Secondary | ICD-10-CM | POA: Diagnosis not present

## 2016-09-11 DIAGNOSIS — I10 Essential (primary) hypertension: Secondary | ICD-10-CM | POA: Diagnosis not present

## 2016-09-11 DIAGNOSIS — E784 Other hyperlipidemia: Secondary | ICD-10-CM | POA: Diagnosis not present

## 2016-09-18 DIAGNOSIS — N200 Calculus of kidney: Secondary | ICD-10-CM | POA: Diagnosis not present

## 2016-09-18 DIAGNOSIS — Z0000 Encounter for general adult medical examination without abnormal findings: Secondary | ICD-10-CM | POA: Diagnosis not present

## 2016-09-18 DIAGNOSIS — M545 Low back pain: Secondary | ICD-10-CM | POA: Diagnosis not present

## 2016-09-18 DIAGNOSIS — Z6830 Body mass index (BMI) 30.0-30.9, adult: Secondary | ICD-10-CM | POA: Diagnosis not present

## 2016-09-18 DIAGNOSIS — Z9861 Coronary angioplasty status: Secondary | ICD-10-CM | POA: Diagnosis not present

## 2016-09-18 DIAGNOSIS — I10 Essential (primary) hypertension: Secondary | ICD-10-CM | POA: Diagnosis not present

## 2016-09-18 DIAGNOSIS — Z1389 Encounter for screening for other disorder: Secondary | ICD-10-CM | POA: Diagnosis not present

## 2016-09-18 DIAGNOSIS — K2270 Barrett's esophagus without dysplasia: Secondary | ICD-10-CM | POA: Diagnosis not present

## 2016-09-18 DIAGNOSIS — Z23 Encounter for immunization: Secondary | ICD-10-CM | POA: Diagnosis not present

## 2016-09-18 DIAGNOSIS — G4709 Other insomnia: Secondary | ICD-10-CM | POA: Diagnosis not present

## 2016-09-18 DIAGNOSIS — E784 Other hyperlipidemia: Secondary | ICD-10-CM | POA: Diagnosis not present

## 2016-09-23 DIAGNOSIS — Z1212 Encounter for screening for malignant neoplasm of rectum: Secondary | ICD-10-CM | POA: Diagnosis not present

## 2016-10-07 ENCOUNTER — Ambulatory Visit (INDEPENDENT_AMBULATORY_CARE_PROVIDER_SITE_OTHER): Payer: Medicare Other | Admitting: Neurology

## 2016-10-07 ENCOUNTER — Encounter: Payer: Self-pay | Admitting: Neurology

## 2016-10-07 VITALS — BP 130/72 | HR 76 | Resp 20 | Ht 68.0 in | Wt 203.0 lb

## 2016-10-07 DIAGNOSIS — G4730 Sleep apnea, unspecified: Secondary | ICD-10-CM | POA: Diagnosis not present

## 2016-10-07 DIAGNOSIS — R0683 Snoring: Secondary | ICD-10-CM

## 2016-10-07 DIAGNOSIS — G3184 Mild cognitive impairment, so stated: Secondary | ICD-10-CM | POA: Diagnosis not present

## 2016-10-07 DIAGNOSIS — G4733 Obstructive sleep apnea (adult) (pediatric): Secondary | ICD-10-CM

## 2016-10-07 DIAGNOSIS — I2510 Atherosclerotic heart disease of native coronary artery without angina pectoris: Secondary | ICD-10-CM | POA: Diagnosis not present

## 2016-10-07 DIAGNOSIS — G4710 Hypersomnia, unspecified: Secondary | ICD-10-CM | POA: Diagnosis not present

## 2016-10-07 DIAGNOSIS — R351 Nocturia: Secondary | ICD-10-CM | POA: Diagnosis not present

## 2016-10-07 MED ORDER — MELATONIN 1 MG SL SUBL: 1.0000 mg | SUBLINGUAL_TABLET | Freq: Every evening | SUBLINGUAL | 0 refills | Status: DC

## 2016-10-07 NOTE — Patient Instructions (Signed)

## 2016-10-07 NOTE — Progress Notes (Signed)
SLEEP MEDICINE CLINIC   Provider:  Larey Seat, M D  Referring Provider: Leanna Battles, MD Primary Care Physician:  Donnajean Lopes, MD  Chief Complaint  Patient presents with  . New Patient (Initial Visit)    not sleeping enough    HPI:  Daniel Colon is a 70 y.o. male,  seen here as a referral from Dr. Philip Aspen for a sleep evalutation,  Mr. Blaney is a right-handed, Caucasian male patient of Dr. Beverly Sessions, presenting today for a sleep consultation. Dr. Sharlett Iles referred him based on a chief complaint of insomnia with underlying hypertension and obesity he would like the patient to be evaluated for possible obstructive sleep apnea.   Chief complaint according to patient :  " I have no problem  to fall asleep, but to stay asleep"  " I nap in daytime " " I have some memory loss, misplacing items or going into a room- not remembering why I went there , and my doctor felt this could be related to poor sleep "  Mr. 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 In 2010 diagnosed with peptic ulcer disease treated with a Billroth II. 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 Nephrolithiasis, vitamin B12 deficiency now supplemented, has been diagnosed as pathological gambler. In 2016 he was evaluated with an overnight pulse oximetry which showed 11 desaturations per hour and snoring and it was recommended that he undergoes a sleep study.  Sleep habits are as follows: The patient is retired and usually has routines throughout the week. Normally between 8:30 to 10:30 PM. The bedroom is almost cold, quiet. He falls asleep having the TV in the background and listening to it with  earphones.Marland Kitchen His wife is not bothered by it -she wears eye shades. He prefers to have some background noise and he feels that helps him to keep sleeping. He mainly sleeps on his back, he sleeps on 3 pillows slightly elevated ,allowing him to watch TV. His wife has witnessed him to snore,sometimes to quit breathing. He is not prone to sinusitis had colds etc, but has a stuffy nose with rhinorrhea.  After about the third hour of sleep he will wake up and has to go to the bathroom. He is woken by the urge to urinate. From there on it continues throughout the night, about every 2-3 hours. He doesn't find it difficult to go back to sleep. He believes he averages 7 hours of nocturnal sleep. Mondays, Wednesdays and Fridays he rises at 7 AM to go to the gym in the morning with his wife. Other days he may sleep a little longer. He usually wakes up without an alarm. Usually between 2 and 4 PM after lunch he will take a nap. A nap may last 1 or 2 hours. He cannot remember if he dreams during those naps or at night.   Sleep medical history and family sleep history:  He grew up to 6 sisters and 3 brothers, is not aware that anybody was a sleep walker at night terrors or enuresis. He also is not sure that his parents but had any kind of sleep disorder. He did not undergo tonsillectomy or adenoidectomy, had no septal nasal deviation surgery or sinus  surgeries never had a traumatic brain injury, no whiplash, no concussion history.   Social history: married, one daughter 10, healthy . Retired from  [employer] after 27 years of service. The patient also is a former smoker who quit in August 2012 but until then smoked a half pack per day for about 45 years. He also participates in regular exercise, does not drink hard liquor , but alcohol in form of beer twice a week when he golfs. He does regularly and throughout the day consume caffeinated beverages mostly in form of coffee- in AM and PM.   Review of Systems:  The  patient endorsed some left edema, fatigue, easy bruising excessive daytime sleepiness, the feeling of not getting enough sleep, muscle aching, rhinorrhea, some urine incontinence, ever since the billroth surgery has soft stool, sometimes poorly formed.  erectile difficulties. Out of a complete 14 system review, the patient complains of only the following symptoms, and all other reviewed systems are negative. Memory loss , subjective." Hard time finding a persons name ".  How likely are you to doze in the following situations: 0 = not likely, 1 = slight chance, 2 = moderate chance, 3 = high chance  Sitting and Reading? 2 Watching Television?3 Sitting inactive in a public place (theater or meeting)?1 As a passenger in a car for an hour without a break?2  Lying down in the afternoon when circumstances permit?2 Sitting and talking to someone?1 Sitting quietly after lunch without alcohol?2 In a car, while stopped for a few minutes in traffic?0  Total = 13    , Fatigue severity score 38  , depression score 3/15    Social History   Social History  . Marital status: Married    Spouse name: N/A  . Number of children: 1  . Years of education: N/A   Occupational History  . Not on file.   Social History Main Topics  . Smoking status: Former Smoker    Packs/day: 1.00    Years: 42.00    Types: Cigarettes    Quit date: 06/21/2011  . Smokeless tobacco: Current User    Types: Chew     Comment: Quit 2.5 years ago.   . Alcohol use 0.0 oz/week     Comment: 2 drinks per week  . Drug use: No  . Sexual activity: Not on file   Other Topics Concern  . Not on file   Social History Narrative   Lives with wife and mother in law.     Family History  Problem Relation Age of Onset  . Sudden death Mother 19  . CAD Brother 12    CABG  . CAD Sister 42    CABG  . CAD Sister 49    CABG  . Cancer Brother   . Liver disease    . Colon cancer Neg Hx     Past Medical History:  Diagnosis  Date  . Barrett's esophagus   . CAD (coronary artery disease)    a. Stent to the [place] (in [place]);  b. ETT(11/13/13): abnormal with early ST changes to suggest ischemia;   c. LHC (11/17/13):  pLAD 30-40, D1 30, mLAD 70-75, mCFX 70, mRCA stent 40 (ISR), EF 55-65%.  Medical Rx  . Cataract   . Colon polyps   . Dyslipidemia   . GERD (gastroesophageal reflux disease)   . Nephrolithiasis   . PUD (peptic ulcer disease)     Past Surgical History:  Procedure Laterality Date  . ANGIOPLASTY  stent - rt cor. art  . BILROTH II PROCEDURE    . LEFT HEART CATHETERIZATION WITH CORONARY ANGIOGRAM N/A 11/17/2013   Procedure: LEFT HEART CATHETERIZATION WITH CORONARY ANGIOGRAM;  Surgeon: Blane Ohara, MD;  Location: [facility] CATH LAB;  Service: Cardiovascular;  Laterality: N/A;  . ulcer surgery      Current Outpatient Prescriptions  Medication Sig Dispense Refill  . aspirin EC 81 MG tablet Take 1 tablet (81 mg total) by mouth daily.    Marland Kitchen atorvastatin (LIPITOR) 40 MG tablet TAKE 1 TABLET DAILY 90 tablet 3  . carvedilol (COREG) 12.5 MG tablet Take 12.5 mg by mouth 2 (two) times daily with a meal.    . chlorhexidine (PERIDEX) 0.12 % solution daily.  0  . Coenzyme Q10 (CO Q-10) 100 MG CAPS Take 200 mg by mouth daily.    Marland Kitchen ezetimibe (ZETIA) 10 MG tablet Take 10 mg by mouth daily.    . fish oil-omega-3 fatty acids 1000 MG capsule Take 2 g by mouth daily.    Marland Kitchen ipratropium (ATROVENT) 0.03 % nasal spray     . Multiple Vitamins-Minerals (MULTIVITAMIN PO) Take 1 tablet by mouth daily.    . nitroGLYCERIN (NITROLINGUAL) 0.4 MG/SPRAY spray USE 1 SPRAY UNDER THE TONGUE EVERY 5 MINUTES FOR 3 DOSES AS NEEDED FOR CHEST PAIN 24 g 2  . omeprazole (PRILOSEC) 10 MG capsule Take 10 mg by mouth daily.    Marland Kitchen telmisartan-hydrochlorothiazide (MICARDIS HCT) 80-12.5 MG per tablet Take 1 tablet by mouth daily.     No current facility-administered medications for this visit.     Allergies as of 10/07/2016  . (No Known  Allergies)    Vitals: BP 130/72   Pulse 76   Resp 20   Ht '5\' 8"'$  (1.727 m)   Wt 203 lb (92.1 kg)   BMI 30.87 kg/m  Last Weight:  Wt Readings from Last 1 Encounters:  10/07/16 203 lb (92.1 kg)   JSH:FWYO mass index is 30.87 kg/m.     Last Height:   Ht Readings from Last 1 Encounters:  10/07/16 '5\' 8"'$  (1.727 m)    Physical exam:  General: The patient is awake, alert and appears not in acute distress. The patient is well groomed. Head: Normocephalic, atraumatic. Neck is supple. Mallampati 3  neck circumference:. Nasal airflow congested , TMJ intact  Retrognathia is seen/ over bite with natural teeth  Cardiovascular:  Regular rate and rhythm, without  murmurs or carotid bruit, and without distended neck veins. Respiratory: Lungs are clear to auscultation. Skin:  Without evidence of edema, or rash Trunk: BMI is 31 . The patient's posture is erect   Neurologic exam : The patient is awake and alert, oriented to place and time.   Memory subjective  described as impaired- will be addressed in RV  MOCA:No flowsheet data found. MMSE:No flowsheet data found.   Attention span & concentration ability appears normal.  Speech is fluent,  without  dysarthria, dysphonia or aphasia.  Mood and affect are appropriate.  Cranial nerves: Pupils are equal and briskly reactive to light. Funduscopic exam without evidence of pallor or edema. Extraocular movements  in vertical and horizontal planes intact and without nystagmus. Visual fields by finger perimetry are intact. Hearing impaired , left over right ear- exposure to noise.  Facial sensation intact to fine touch.  Facial motor strength is symmetric and tongue and uvula move midline. Shoulder shrug was symmetrical.   Motor exam:  Normal tone, muscle bulk and symmetric strength in  all extremities.  Sensory:  Fine touch, pinprick and vibration were tested in all extremities. Proprioception tested in the upper extremities was  normal.  Coordination: Rapid alternating movements in the fingers/hands was normal.  Finger-to-nose maneuver  normal without evidence of ataxia, dysmetria or tremor.  Gait and station: Patient walks without assistive device and is able unassisted to climb up to the exam table. Strength within normal limits.  Stance is stable and normal.    Deep tendon reflexes: in the  upper and lower extremities are symmetric and intact.   The patient was advised of the nature of the diagnosed sleep disorder , the treatment options and risks for general a health and wellness arising from not treating the condition.  I spent more than 45 minutes of face to face time with the patient. Greater than 50% of time was spent in counseling and coordination of care. We have discussed the diagnosis and differential and I answered the patient's questions.     Assessment:  After physical and neurologic examination, review of laboratory studies,  Personal review of imaging studies, reports of other /same  Imaging studies ,  Results of polysomnography/ neurophysiology testing and pre-existing records as far as provided in visit., my assessment is   1) I will evaluate Mr. Delay obstructive sleep apnea based on his frequent nocturia, his habit to sleep in supine position which is also frustrated by his back issues. He is not comfortable sleeping on his side. He has a higher grade Mallampati, and he wakes up unrefreshed and unrestored. His excessive daytime sleepiness as reflected in an Epworth score of 13 points. He takes naps very frequently and of a great duration. Considering about 7 hours of nocturnal sleep this 2 hours of naps he achieves about 8-9 hours of sleep per 24 hour period. His wife has witnessed him to please irregularly and snore. I have reviewed his past medical history, comorbidities, and cannot find a good reason for the nocturia itself. Obstructive sleep apnea may be a contributing factor in this gentleman. His  neck size was 17 inches, borderline.   Plan:  Treatment plan and additional workup : SPLIT night PSG for OSA, snoring, Nocturia. Sleep habits and hygiene reviewed.  I like for him to reduce the coffee intake to the AM hours. Hydrate with water.  TV may be effectively replaced by an audio book, books on tapes. This would eliminate the screen light.  Melatonin, 3 mg -6 mg from [facility]. 30 minutes before bedtime.  Memory testing in RV.     Asencion Partridge Plummer Matich MD  10/07/2016   CC: Leanna Battles, [address] [place], [place] [address]

## 2016-10-28 ENCOUNTER — Ambulatory Visit (INDEPENDENT_AMBULATORY_CARE_PROVIDER_SITE_OTHER): Payer: Medicare Other | Admitting: Neurology

## 2016-10-28 DIAGNOSIS — G4733 Obstructive sleep apnea (adult) (pediatric): Secondary | ICD-10-CM | POA: Diagnosis not present

## 2016-10-28 DIAGNOSIS — G4710 Hypersomnia, unspecified: Secondary | ICD-10-CM | POA: Diagnosis not present

## 2016-10-28 DIAGNOSIS — G3184 Mild cognitive impairment, so stated: Secondary | ICD-10-CM

## 2016-10-28 DIAGNOSIS — R351 Nocturia: Secondary | ICD-10-CM

## 2016-10-28 DIAGNOSIS — G4730 Sleep apnea, unspecified: Secondary | ICD-10-CM

## 2016-10-28 DIAGNOSIS — R0683 Snoring: Secondary | ICD-10-CM

## 2016-11-02 ENCOUNTER — Ambulatory Visit (AMBULATORY_SURGERY_CENTER): Payer: Self-pay | Admitting: *Deleted

## 2016-11-02 VITALS — Ht 68.5 in | Wt 201.0 lb

## 2016-11-02 DIAGNOSIS — K2270 Barrett's esophagus without dysplasia: Secondary | ICD-10-CM

## 2016-11-02 NOTE — Progress Notes (Signed)
No egg or soy allergy known to patient  No issues with past sedation with any surgeries  or procedures, no intubation problems  No diet pills per patient No home 02 use per patient  No blood thinners per patient  Pt denies issues with constipation  No A fib or A flutter   emmi declined'

## 2016-11-06 ENCOUNTER — Telehealth: Payer: Self-pay | Admitting: Neurology

## 2016-11-06 NOTE — Telephone Encounter (Signed)
Please call patient :IMPRESSION:  Patient presented with fragmented sleep, nocturnal diaphoresis, but no physiological explanation was found.  There was neither clinically significant apnea , nor hypoxemia, nor PLM disorder identified.  Heart rate was regular.    RECOMMENDATIONS:  Primary insomnia. Patient may benefit from sleep  psychologist instruction, relaxation  and sleep hygiene implementation.   1.  Avoid caffeine-containing beverages and chocolate.  2. Avoid TV and screen light in the bedroom.  3. Consider dedicated sleep psychology referral if insomnia is of clinical concern.   A follow up appointment can be scheduled in the Sleep Clinic at [facility]. The referring provider will be notified of the results.

## 2016-11-06 NOTE — Procedures (Signed)
PATIENT'S NAME:  Daniel Colon, Daniel Colon DOB:      1946/05/15      MR#:    [id number]     DATE OF RECORDING: 10/28/2016 REFERRING M.D.:  Leanna Battles, MD Study Performed:   Baseline Polysomnogram HISTORY:  70 year old male patient  with Insomnia, History of :   Barrett's esophagus, CAD, cataract, colon polyps, dyslipidemia, GERD, nephrolithiasis. Status post Billroth 2, Angioplasty. The patient endorsed the Epworth Sleepiness Scale at 13 points. FS Score at 38 points , weight of 203 pounds with a height of 68 (inches), resulting in a BMI of 30.7 kg/m2.The patient's neck circumference measured 17  inches.  CURRENT MEDICATIONS: Aspirin, Atorvastatin, Carvedilol, Chlorhexidine, Coenzyme, Ezetimibe, Fish Oil, Ipratropium, Multi-Vitamin, Nitroglycerin, Omeprazole, Telmisartan-Hydrochlorothiazide   PROCEDURE:  This is a multichannel digital polysomnogram utilizing the Somnostar 11.2 system.  Electrodes and sensors were applied and monitored per AASM Specifications.   EEG, EOG, Chin and Limb EMG, were sampled at 200 Hz.  ECG, Snore and Nasal Pressure, Thermal Airflow, Respiratory Effort, CPAP Flow and Pressure, Oximetry was sampled at 50 Hz. Digital video and audio were recorded.      BASELINE STUDY  Lights Out was at 22:48 and Lights On at 05:00.  Total recording time (TRT) was 373 minutes, with a total sleep time (TST) of  283 minutes.   The patient's sleep latency was 23 minutes.  REM latency was 67 minutes.  The sleep efficiency was 75.9 %. WASO (Wake after sleep onset) was 68.5 minutes.  There were 14.5 minutes in Stage N1, 198.5 minutes Stage N2, 0 minutes Stage N3 and 70 minutes in Stage REM.  The percentage of Stage N1 was 5.1%, Stage N2 was 70.1%, Stage N3 was 0% and Stage R (REM sleep) was 24.7%.   RESPIRATORY ANALYSIS:  There were a total of 20 respiratory events:  0 apneas and 20 hypopneas with a 7 respiratory event related arousals (RERAs). The total APNEA/HYPOPNEA INDEX (AHI) was 4.2/hour and the  total RESPIRATORY DISTURBANCE INDEX was 5.7 /hour.  0 events occurred in REM sleep and 40 events in NREM. The REM AHI was 0.0 /hour, versus a non-REM AHI of 5.6.  The patient spent 106 minutes of total sleep time in the supine position and 177 minutes in non-supine. The supine AHI was 6.8 versus a non-supine AHI of 2.7.  OXYGEN SATURATION & C02:  The Wake baseline 02 saturation was 96%, with the lowest being 89%. Time spent below 89% saturation equaled 0 minutes.  PERIODIC LIMB MOVEMENTS:   The patient had a total of 11 Periodic Limb Movements. The Periodic Limb Movement (PLM) index was 2.3 and the PLM Arousal index was 0/hour.  ECG in NSR  IMPRESSION:  Patient presented with fragmented sleep, nocturnal diaphoresis, but no physiological explanation was found.  There was neither clinically significant apnea , nor hypoxemia, nor PLM disorder identified.  Heart rate was regular.    RECOMMENDATIONS:  Primary insomnia. Patient may benefit from sleep  psychologist instruction, relaxation  and sleep hygiene implementation.   1.  Avoid caffeine-containing beverages and chocolate.  2. Avoid TV and screen light in the bedroom.  3. Consider dedicated sleep psychology referral if insomnia is of clinical concern.   4. A follow up appointment can be scheduled in the Sleep Clinic at [facility]. The referring provider will be notified of the results.      I certify that I have reviewed the entire raw data recording prior to the issuance of this report  in accordance with the Standards of Accreditation of the [employer] of Sleep Medicine (AASM)      Larey Seat, MD  11-06-2016 Diplomat, American Board of Psychiatry and Neurology  Diplomat, American Board of [place] Director, [place] [facility] [employer]

## 2016-11-09 NOTE — Telephone Encounter (Signed)
I called pt and advised him that his sleep study revealed primary insomnia and that he may benefit from a sleep psychologist referral to discuss relaxation and sleep hygiene implementation. I advised pt to avoid caffeine containing beverages and chocolate. I advised pt to avoid TV and screen light in the bedroom. Pt declined a sleep psychologist referral at this time as well as a follow up appt with Dr. Brett Fairy at this time. Pt asked that I fax a copy to Dr. Philip Aspen and he will discuss the sleep study with Dr. Philip Aspen. Pt verbalized understanding of results. Pt had no questions at this time but was encouraged to call back if questions arise.

## 2016-11-16 ENCOUNTER — Encounter: Payer: Self-pay | Admitting: Internal Medicine

## 2016-11-16 ENCOUNTER — Ambulatory Visit (AMBULATORY_SURGERY_CENTER): Payer: Medicare Other | Admitting: Internal Medicine

## 2016-11-16 VITALS — BP 100/48 | HR 50 | Temp 97.3°F | Resp 13 | Ht 68.5 in | Wt 201.0 lb

## 2016-11-16 DIAGNOSIS — K2270 Barrett's esophagus without dysplasia: Secondary | ICD-10-CM

## 2016-11-16 DIAGNOSIS — K219 Gastro-esophageal reflux disease without esophagitis: Secondary | ICD-10-CM | POA: Diagnosis not present

## 2016-11-16 DIAGNOSIS — E7800 Pure hypercholesterolemia, unspecified: Secondary | ICD-10-CM | POA: Diagnosis not present

## 2016-11-16 DIAGNOSIS — I2510 Atherosclerotic heart disease of native coronary artery without angina pectoris: Secondary | ICD-10-CM | POA: Diagnosis not present

## 2016-11-16 DIAGNOSIS — I10 Essential (primary) hypertension: Secondary | ICD-10-CM | POA: Diagnosis not present

## 2016-11-16 MED ORDER — SODIUM CHLORIDE 0.9 % IV SOLN: 500.0000 mL | INTRAVENOUS | Status: DC

## 2016-11-16 NOTE — Progress Notes (Signed)
Called to room to assist during endoscopic procedure.  Patient ID and intended procedure confirmed with present staff. Received instructions for my participation in the procedure from the performing physician.  

## 2016-11-16 NOTE — Op Note (Signed)
[place] Patient Name: Daniel Colon Procedure Date: 11/16/2016 9:56 AM MRN: [id number] Endoscopist: Jerene Bears , MD Age: 70 Referring MD:  Date of Birth: 1946/02/19 Gender: Male Account #: [id number] Procedure:                Upper GI endoscopy Indications:              Follow-up of Barrett's esophagus, last endoscopy                            without dysplasia 3 years ago Medicines:                Monitored Anesthesia Care Procedure:                Pre-Anesthesia Assessment:                           - Prior to the procedure, a History and Physical                            was performed, and patient medications and                            allergies were reviewed. The patient's tolerance of                            previous anesthesia was also reviewed. The risks                            and benefits of the procedure and the sedation                            options and risks were discussed with the patient.                            All questions were answered, and informed consent                            was obtained. Prior Anticoagulants: The patient has                            taken no previous anticoagulant or antiplatelet                            agents. ASA Grade Assessment: III - A patient with                            severe systemic disease. After reviewing the risks                            and benefits, the patient was deemed in                            satisfactory condition to undergo the procedure.  After obtaining informed consent, the endoscope was                            passed under direct vision. Throughout the                            procedure, the patient's blood pressure, pulse, and                            oxygen saturations were monitored continuously. The                            Model GIF-[id number]) scope was introduced                            through the mouth, and  advanced to the second part                            of duodenum. The upper GI endoscopy was                            accomplished without difficulty. The patient                            tolerated the procedure well. Scope In: Scope Out: Findings:                 The esophagus and gastroesophageal junction were                            examined with white light and narrow band imaging                            (NBI) from a forward view and retroflexed position.                            There were esophageal mucosal changes consistent                            with short-segment Barrett's esophagus. These                            changes involved the mucosa at the upper extent of                            the gastric folds (39 cm from the incisors)                            extending to the Z-line (36 cm from the incisors).                            Circumferential salmon-colored mucosa was present  at 39 cm, three tongues of salmon-colored mucosa                            were present from 36 to 38 cm, no visible                            abnormalities were present and hiatal narrowing was                            identified at 42 cm. The maximum longitudinal                            extent of these esophageal mucosal changes was 3 cm                            in length. Mucosa was biopsied with a cold forceps                            for histology in a targeted manner and in 4                            quadrants from 36 to 39 cm from the incisors. A                            total of 2 specimen bottles were sent to pathology.                           The entire examined stomach was normal.                           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 The gastrojejunal anastomosis was                            characterized by healthy appearing mucosa. This was                             traversed. The efferent limb was examined and                            unremarkable. Complications:            No immediate complications. Estimated Blood Loss:     Estimated blood loss was minimal. Impression:               - Esophageal mucosal changes consistent with                            short-segment Barrett's esophagus. Biopsied.                           - 3 cm hiatal hernia.                           -  Normal gastric mucosa.                           - Patent Billroth II gastrojejunostomy was found,                            characterized by healthy appearing mucosa. Recommendation:           - Patient has a contact number available for                            emergencies. The signs and symptoms of potential                            delayed complications were discussed with the                            patient. Return to normal activities tomorrow.                            Written discharge instructions were provided to the                            patient.                           - Resume previous diet.                           - Continue present medications including daily PPI.                           - Await pathology results.                           - Repeat upper endoscopy for surveillance based on                            pathology results. Jerene Bears, MD 11/16/2016 10:28:41 AM This report has been signed electronically.

## 2016-11-16 NOTE — Progress Notes (Signed)
Report to PACU, RN, vss, BBS= Clear.  

## 2016-11-16 NOTE — Patient Instructions (Signed)
Impression/Recommendations:  Barrett's Esophagus handout given to patient.  Repeat upper endoscopy for surveillance based on pathology report.  YOU HAD AN ENDOSCOPIC PROCEDURE TODAY AT [facility]:   Refer to the procedure report that was given to you for any specific questions about what was found during the examination.  If the procedure report does not answer your questions, please call your gastroenterologist to clarify.  If you requested that your care partner not be given the details of your procedure findings, then the procedure report has been included in a sealed envelope for you to review at your convenience later.  YOU SHOULD EXPECT: Some feelings of bloating in the abdomen. Passage of more gas than usual.  Walking can help get rid of the air that was put into your GI tract during the procedure and reduce the bloating. If you had a lower endoscopy (such as a colonoscopy or flexible sigmoidoscopy) you may notice spotting of blood in your stool or on the toilet paper. If you underwent a bowel prep for your procedure, you may not have a normal bowel movement for a few days.  Please Note:  You might notice some irritation and congestion in your nose or some drainage.  This is from the oxygen used during your procedure.  There is no need for concern and it should clear up in a day or so.  SYMPTOMS TO REPORT IMMEDIATELY:  Following upper endoscopy (EGD)  Vomiting of blood or coffee ground material  New chest pain or pain under the shoulder blades  Painful or persistently difficult swallowing  New shortness of breath  Fever of 100F or higher  Black, tarry-looking stools  For urgent or emergent issues, a gastroenterologist can be reached at any hour by calling [phone].   DIET:  We do recommend a small meal at first, but then you may proceed to your regular diet.  Drink plenty of fluids but you should avoid alcoholic beverages for 24 hours.  ACTIVITY:  You should  plan to take it easy for the rest of today and you should NOT DRIVE or use heavy machinery until tomorrow (because of the sedation medicines used during the test).    FOLLOW UP: Our staff will call the number listed on your records the next business day following your procedure to check on you and address any questions or concerns that you may have regarding the information given to you following your procedure. If we do not reach you, we will leave a message.  However, if you are feeling well and you are not experiencing any problems, there is no need to return our call.  We will assume that you have returned to your regular daily activities without incident.  If any biopsies were taken you will be contacted by phone or by letter within the next 1-3 weeks.  Please call us at [phone] if you have not heard about the biopsies in 3 weeks.    SIGNATURES/CONFIDENTIALITY: You and/or your care partner have signed paperwork which will be entered into your electronic medical record.  These signatures attest to the fact that that the information above on your After Visit Summary has been reviewed and is understood.  Full responsibility of the confidentiality of this discharge information lies with you and/or your care-partner.

## 2016-11-17 ENCOUNTER — Telehealth: Payer: Self-pay | Admitting: *Deleted

## 2016-11-17 NOTE — Telephone Encounter (Signed)
  Follow up Call-  Call back number 11/16/2016 11/11/2015  Post procedure Call Back phone  # [phone]  Permission to leave phone message Yes Yes  Some recent data might be hidden     Patient questions:  Do you have a fever, pain , or abdominal swelling? No. Pain Score  0 *  Have you tolerated food without any problems? Yes.    Have you been able to return to your normal activities? Yes.    Do you have any questions about your discharge instructions: Diet   No. Medications  No. Follow up visit  No.  Do you have questions or concerns about your Care? No.  Actions: * If pain score is 4 or above: No action needed, pain <4.

## 2016-11-20 ENCOUNTER — Encounter: Payer: Self-pay | Admitting: Internal Medicine

## 2016-11-27 ENCOUNTER — Encounter: Payer: Self-pay | Admitting: Acute Care

## 2016-11-27 ENCOUNTER — Telehealth: Payer: Self-pay | Admitting: Acute Care

## 2016-11-27 NOTE — Telephone Encounter (Signed)
Please route to Rodena Piety to get scheduled. Thanks

## 2016-11-27 NOTE — Telephone Encounter (Signed)
SG  This pt. Called stating he never received a call about getting set up with his repeat CT and he wanted to get this as well as his wife set up for the same day. Since he is in the lung screening program I am not sure who usually follows up with this. Below is your message.  Result Notes   Notes Recorded by Magdalen Spatz, NP on 12/12/2015 at 12:20 PM Results have been called to the patient, who verbalized understanding.I explained that a Lung RADS 2 result: indicates nodules that are benign in appearance and behavior with a very low likelihood of becoming a clinically active cancer due to size or lack of growth. Recommendation per radiology is for a repeat LDCT in 12 months, which we will call and schedule in 11/2016. She verbalized understanding.

## 2016-11-30 NOTE — Telephone Encounter (Signed)
I have scheduled the CT's for the patient and his wife on 12/11/2016 at [facility]. I have emailed the appt information to him along with [facility] phone # in case they need to change the appts.

## 2016-12-06 ENCOUNTER — Other Ambulatory Visit: Payer: Self-pay | Admitting: Cardiology

## 2016-12-11 ENCOUNTER — Ambulatory Visit
Admission: RE | Admit: 2016-12-11 | Discharge: 2016-12-11 | Disposition: A | Discharge status: Home / Self Care | Payer: Medicare Other | Source: Ambulatory Visit | Attending: Acute Care | Admitting: Acute Care

## 2016-12-11 ENCOUNTER — Telehealth: Payer: Self-pay | Admitting: Acute Care

## 2016-12-11 DIAGNOSIS — Z87891 Personal history of nicotine dependence: Secondary | ICD-10-CM

## 2016-12-11 NOTE — Telephone Encounter (Signed)
I called to give Daniel Colon the results of his low-dose screening CT. There was no answer. I have left a message requesting that he call the office for results of his screening scan along with her contact information. We will await the return call.

## 2017-02-02 ENCOUNTER — Telehealth: Payer: Self-pay | Admitting: Acute Care

## 2017-02-02 NOTE — Progress Notes (Signed)
HPI The patient presents for follow up of  CAD, s/p stent to RCA in 1996 in [place].  RoutineETT(11/13/13) was abnormal with early ST changes to suggest ischemia. He was set up had LHC (11/17/13): pLAD 30-40, D1 30, mLAD 70-75, mCFX 70, mRCA stent 40 (ISR), EF 55-65%.  He was managed medically.  Since that time he has done well   He goes to the gym and golf.  He has no complaints.  The patient denies any new symptoms such as chest discomfort, neck or arm discomfort. There has been no new shortness of breath, PND or orthopnea. There have been no reported palpitations, presyncope or syncope.  He says that he is being followed for CKD and he gets yearly CT scans.     No Known Allergies  Current Outpatient Prescriptions  Medication Sig Dispense Refill  . aspirin EC 81 MG tablet Take 1 tablet (81 mg total) by mouth daily. (Patient taking differently: Take 81 mg by mouth every other day. )    . atorvastatin (LIPITOR) 40 MG tablet TAKE 1 TABLET DAILY 90 tablet 3  . carvedilol (COREG) 12.5 MG tablet Take 12.5 mg by mouth 2 (two) times daily with a meal.    . chlorhexidine (PERIDEX) 0.12 % solution daily.  0  . Coenzyme Q10 (CO Q-10) 100 MG CAPS Take 200 mg by mouth daily.    Marland Kitchen ezetimibe (ZETIA) 10 MG tablet Take 10 mg by mouth daily.    . fish oil-omega-3 fatty acids 1000 MG capsule Take 2 g by mouth daily.    Marland Kitchen ipratropium (ATROVENT) 0.03 % nasal spray     . Multiple Vitamins-Minerals (MULTIVITAMIN PO) Take 1 tablet by mouth daily.    . nitroGLYCERIN (NITROLINGUAL) 0.4 MG/SPRAY spray USE 1 SPRAY UNDER THE TONGUE EVERY 5 MINUTES FOR 3 DOSES AS NEEDED FOR CHEST PAIN 24 g 2  . omeprazole (PRILOSEC) 10 MG capsule Take 10 mg by mouth daily.    Marland Kitchen telmisartan-hydrochlorothiazide (MICARDIS HCT) 80-12.5 MG per tablet Take 1 tablet by mouth daily.     Current Facility-Administered Medications  Medication Dose Route Frequency Provider Last Rate Last Dose  . 0.9 %  sodium chloride infusion  500 mL  Intravenous Continuous Jerene Bears, MD        Past Medical History:  Diagnosis Date  . Allergy   . Arthritis   . Barrett's esophagus   . CAD (coronary artery disease)    a. Stent to the [place] (in [place]);  b. ETT(11/13/13): abnormal with early ST changes to suggest ischemia;   c. LHC (11/17/13):  pLAD 30-40, D1 30, mLAD 70-75, mCFX 70, mRCA stent 40 (ISR), EF 55-65%.  Medical Rx  . Cataract    left eye removed, right immature  . Colon polyps   . Dyslipidemia   . GERD (gastroesophageal reflux disease)   . Hypertension   . Myocardial infarction 1996  . Nephrolithiasis   . PUD (peptic ulcer disease)     Past Surgical History:  Procedure Laterality Date  . ANGIOPLASTY     stent - rt cor. art  . BILROTH II PROCEDURE    . COLONOSCOPY    . LEFT HEART CATHETERIZATION WITH CORONARY ANGIOGRAM N/A 11/17/2013   Procedure: LEFT HEART CATHETERIZATION WITH CORONARY ANGIOGRAM;  Surgeon: Blane Ohara, MD;  Location: [facility] CATH LAB;  Service: Cardiovascular;  Laterality: N/A;  . POLYPECTOMY    . ulcer surgery      ROS:  As stated in  the HPI and negative for all other systems.  PHYSICAL EXAM BP 127/73   Pulse 71   Ht '5\' 8"'$  (1.727 m)   Wt 204 lb 6.4 oz (92.7 kg)   BMI 31.08 kg/m  GENERAL:  Well appearing HEENT:  Pupils equal round and reactive, fundi not visualized, oral mucosa unremarkable NECK:  No jugular venous distention, waveform within normal limits, carotid upstroke brisk and symmetric, no bruits, no thyromegaly LUNGS:  Clear to auscultation bilaterally BACK:  No CVA tenderness CHEST:  Unremarkable HEART:  PMI not displaced or sustained,S1 and S2 within normal limits, no S3, no S4, no clicks, no rubs, no  murmurs ABD:  Flat, positive bowel sounds normal in frequency in pitch, no bruits, no rebound, no guarding, no midline pulsatile mass, no hepatomegaly, no splenomegaly, abdominal scar well healed. EXT:  2 plus pulses throughout, no edema, no cyanosis no clubbing   EKG:     Sinus rhythm, rate 71, axis within normal limits, intervals within normal limits, no acute ST-T wave changes.  He did have some ST depression mildly in nonspecific in the inferolateral leads. 02/04/2017  ASSESSMENT AND PLAN    CAD: The patient has no new sypmtoms.  No further cardiovascular testing is indicated.  We will continue with aggressive risk reduction and meds as listed.    DYSLIPIDEMIA:  This is followed by Donnajean Lopes, MD .  I have suggested a goal LDL of 70.   I will be happy to review these levels and I would have a low threshold to increase his Lipitor or change to Crestor if not at target.   HTN:  BP NL.  Continue current meds.

## 2017-02-02 NOTE — Telephone Encounter (Signed)
I have called Daniel Colon  with the results of his low-dose screening CT. His scan was read as a Lung RADS 1, negative study: no nodules or definitely benign nodules. Radiology recommendation is for a repeat LDCT in 12 months. I told him we would call and schedule his exam for December 2018. I also explained that his scan revealed aortic atherosclerosis, and three-vessel coronary artery calcification. The patient stated he was aware of this, and is currently on Lipitor. His triglycerides and cholesterol are checked annually by his primary care provider. At his request I'm sending a copy of the results to his primary care physician Dr. Bevelyn Buckles, and also his cardiologist Dr. Percival Spanish . He verbalized understanding of the above and had no further questions at completion of the call.

## 2017-02-03 ENCOUNTER — Ambulatory Visit (INDEPENDENT_AMBULATORY_CARE_PROVIDER_SITE_OTHER): Payer: Medicare Other | Admitting: Cardiology

## 2017-02-03 ENCOUNTER — Encounter: Payer: Self-pay | Admitting: Cardiology

## 2017-02-03 VITALS — BP 127/73 | HR 71 | Ht 68.0 in | Wt 204.4 lb

## 2017-02-03 DIAGNOSIS — I2510 Atherosclerotic heart disease of native coronary artery without angina pectoris: Secondary | ICD-10-CM

## 2017-02-03 NOTE — Patient Instructions (Signed)

## 2017-02-04 ENCOUNTER — Encounter: Payer: Self-pay | Admitting: Cardiology

## 2017-05-07 DIAGNOSIS — Z6831 Body mass index (BMI) 31.0-31.9, adult: Secondary | ICD-10-CM | POA: Diagnosis not present

## 2017-05-07 DIAGNOSIS — I10 Essential (primary) hypertension: Secondary | ICD-10-CM | POA: Diagnosis not present

## 2017-05-07 DIAGNOSIS — M79641 Pain in right hand: Secondary | ICD-10-CM | POA: Diagnosis not present

## 2017-05-12 DIAGNOSIS — G5621 Lesion of ulnar nerve, right upper limb: Secondary | ICD-10-CM | POA: Diagnosis not present

## 2017-05-14 DIAGNOSIS — M5412 Radiculopathy, cervical region: Secondary | ICD-10-CM | POA: Diagnosis not present

## 2017-05-14 DIAGNOSIS — M6289 Other specified disorders of muscle: Secondary | ICD-10-CM | POA: Diagnosis not present

## 2017-05-14 DIAGNOSIS — G5621 Lesion of ulnar nerve, right upper limb: Secondary | ICD-10-CM | POA: Diagnosis not present

## 2017-05-24 DIAGNOSIS — R531 Weakness: Secondary | ICD-10-CM | POA: Diagnosis not present

## 2017-05-24 DIAGNOSIS — M50221 Other cervical disc displacement at C4-C5 level: Secondary | ICD-10-CM | POA: Diagnosis not present

## 2017-05-24 DIAGNOSIS — G5621 Lesion of ulnar nerve, right upper limb: Secondary | ICD-10-CM | POA: Diagnosis not present

## 2017-05-24 DIAGNOSIS — M50222 Other cervical disc displacement at C5-C6 level: Secondary | ICD-10-CM | POA: Diagnosis not present

## 2017-05-24 DIAGNOSIS — M5412 Radiculopathy, cervical region: Secondary | ICD-10-CM | POA: Diagnosis not present

## 2017-06-02 DIAGNOSIS — M50323 Other cervical disc degeneration at C6-C7 level: Secondary | ICD-10-CM | POA: Diagnosis not present

## 2017-06-02 DIAGNOSIS — M50322 Other cervical disc degeneration at C5-C6 level: Secondary | ICD-10-CM | POA: Diagnosis not present

## 2017-06-02 DIAGNOSIS — M4802 Spinal stenosis, cervical region: Secondary | ICD-10-CM | POA: Diagnosis not present

## 2017-06-02 DIAGNOSIS — M47812 Spondylosis without myelopathy or radiculopathy, cervical region: Secondary | ICD-10-CM | POA: Diagnosis not present

## 2017-06-09 DIAGNOSIS — G5621 Lesion of ulnar nerve, right upper limb: Secondary | ICD-10-CM | POA: Diagnosis not present

## 2017-06-30 DIAGNOSIS — G5621 Lesion of ulnar nerve, right upper limb: Secondary | ICD-10-CM | POA: Diagnosis not present

## 2017-07-01 ENCOUNTER — Other Ambulatory Visit: Payer: Self-pay | Admitting: Orthopedic Surgery

## 2017-07-01 DIAGNOSIS — G5621 Lesion of ulnar nerve, right upper limb: Secondary | ICD-10-CM

## 2017-07-14 ENCOUNTER — Ambulatory Visit
Admission: RE | Admit: 2017-07-14 | Discharge: 2017-07-14 | Disposition: A | Discharge status: Home / Self Care | Payer: Medicare Other | Source: Ambulatory Visit | Attending: Orthopedic Surgery | Admitting: Orthopedic Surgery

## 2017-07-14 DIAGNOSIS — M19021 Primary osteoarthritis, right elbow: Secondary | ICD-10-CM | POA: Diagnosis not present

## 2017-07-14 DIAGNOSIS — G5621 Lesion of ulnar nerve, right upper limb: Secondary | ICD-10-CM

## 2017-07-21 DIAGNOSIS — G5621 Lesion of ulnar nerve, right upper limb: Secondary | ICD-10-CM | POA: Diagnosis not present

## 2017-07-22 ENCOUNTER — Other Ambulatory Visit: Payer: Self-pay | Admitting: Orthopedic Surgery

## 2017-07-26 ENCOUNTER — Telehealth: Payer: Self-pay | Admitting: *Deleted

## 2017-07-26 NOTE — Telephone Encounter (Signed)
Requesting surgical clearance:   1. Type of surgery: Right Ulnar Nerve Decompression possible Transposition  2. Surgeon: Dr Leanora Cover  3. Surgical date: 08/05/2017  4. Medications that need to be help: Aspirin  5. [facility]: [phone]   Is pt cleared for surgery?

## 2017-07-27 NOTE — Telephone Encounter (Signed)
Low surgical risk by modified Lee criteria (.9%).  He is going for a low risk surgery from a cardiovascular standpoint.  He has a high functional level.  Therefore, based on ACC/AHA guidelines, the patient would be at acceptable risk for the planned procedure without further cardiovascular testing.

## 2017-07-27 NOTE — Telephone Encounter (Signed)
Clearance faxed to the [facility] via [employer]

## 2017-07-29 ENCOUNTER — Encounter (HOSPITAL_BASED_OUTPATIENT_CLINIC_OR_DEPARTMENT_OTHER): Payer: Self-pay | Admitting: *Deleted

## 2017-07-30 ENCOUNTER — Encounter (HOSPITAL_BASED_OUTPATIENT_CLINIC_OR_DEPARTMENT_OTHER): Payer: Self-pay | Admitting: *Deleted

## 2017-08-02 ENCOUNTER — Encounter (HOSPITAL_BASED_OUTPATIENT_CLINIC_OR_DEPARTMENT_OTHER)
Admission: RE | Admit: 2017-08-02 | Discharge: 2017-08-02 | Disposition: A | Discharge status: Home / Self Care | Payer: Medicare Other | Source: Ambulatory Visit | Attending: Orthopedic Surgery | Admitting: Orthopedic Surgery

## 2017-08-02 DIAGNOSIS — I2510 Atherosclerotic heart disease of native coronary artery without angina pectoris: Secondary | ICD-10-CM | POA: Diagnosis not present

## 2017-08-02 DIAGNOSIS — M1990 Unspecified osteoarthritis, unspecified site: Secondary | ICD-10-CM | POA: Diagnosis not present

## 2017-08-02 DIAGNOSIS — K2270 Barrett's esophagus without dysplasia: Secondary | ICD-10-CM | POA: Diagnosis not present

## 2017-08-02 DIAGNOSIS — G5621 Lesion of ulnar nerve, right upper limb: Secondary | ICD-10-CM | POA: Diagnosis not present

## 2017-08-02 DIAGNOSIS — I10 Essential (primary) hypertension: Secondary | ICD-10-CM | POA: Diagnosis not present

## 2017-08-02 DIAGNOSIS — Z87891 Personal history of nicotine dependence: Secondary | ICD-10-CM | POA: Diagnosis not present

## 2017-08-02 DIAGNOSIS — Z8711 Personal history of peptic ulcer disease: Secondary | ICD-10-CM | POA: Diagnosis not present

## 2017-08-02 DIAGNOSIS — Z8249 Family history of ischemic heart disease and other diseases of the circulatory system: Secondary | ICD-10-CM | POA: Diagnosis not present

## 2017-08-02 DIAGNOSIS — E785 Hyperlipidemia, unspecified: Secondary | ICD-10-CM | POA: Diagnosis not present

## 2017-08-02 DIAGNOSIS — K219 Gastro-esophageal reflux disease without esophagitis: Secondary | ICD-10-CM | POA: Diagnosis not present

## 2017-08-02 DIAGNOSIS — Z955 Presence of coronary angioplasty implant and graft: Secondary | ICD-10-CM | POA: Diagnosis not present

## 2017-08-02 DIAGNOSIS — Z79899 Other long term (current) drug therapy: Secondary | ICD-10-CM | POA: Diagnosis not present

## 2017-08-02 DIAGNOSIS — Z86010 Personal history of colonic polyps: Secondary | ICD-10-CM | POA: Diagnosis not present

## 2017-08-02 DIAGNOSIS — I252 Old myocardial infarction: Secondary | ICD-10-CM | POA: Diagnosis not present

## 2017-08-02 DIAGNOSIS — Z7982 Long term (current) use of aspirin: Secondary | ICD-10-CM | POA: Diagnosis not present

## 2017-08-02 LAB — BASIC METABOLIC PANEL
ANION GAP: 10 (ref 5–15)
BUN: 19 mg/dL (ref 6–20)
CHLORIDE: 106 mmol/L (ref 101–111)
CO2: 22 mmol/L (ref 22–32)
CREATININE: 0.97 mg/dL (ref 0.61–1.24)
Calcium: 9.1 mg/dL (ref 8.9–10.3)
GFR calc non Af Amer: >60 mL/min (ref >60)
Glucose, Bld: 105 mg/dL — ABNORMAL HIGH (ref 65–99)
POTASSIUM: 5.1 mmol/L (ref 3.5–5.1)
SODIUM: 138 mmol/L (ref 135–145)

## 2017-08-05 ENCOUNTER — Ambulatory Visit (HOSPITAL_BASED_OUTPATIENT_CLINIC_OR_DEPARTMENT_OTHER): Payer: Medicare Other | Admitting: Anesthesiology

## 2017-08-05 ENCOUNTER — Ambulatory Visit (HOSPITAL_BASED_OUTPATIENT_CLINIC_OR_DEPARTMENT_OTHER)
Admission: RE | Admit: 2017-08-05 | Discharge: 2017-08-05 | Disposition: A | Discharge status: Home / Self Care | Payer: Medicare Other | Source: Ambulatory Visit | Attending: Orthopedic Surgery | Admitting: Orthopedic Surgery

## 2017-08-05 ENCOUNTER — Encounter (HOSPITAL_BASED_OUTPATIENT_CLINIC_OR_DEPARTMENT_OTHER): Payer: Self-pay | Admitting: Anesthesiology

## 2017-08-05 ENCOUNTER — Encounter (HOSPITAL_BASED_OUTPATIENT_CLINIC_OR_DEPARTMENT_OTHER)
Admission: RE | Disposition: A | Discharge status: Home / Self Care | Payer: Self-pay | Source: Ambulatory Visit | Attending: Orthopedic Surgery

## 2017-08-05 DIAGNOSIS — M1990 Unspecified osteoarthritis, unspecified site: Secondary | ICD-10-CM | POA: Insufficient documentation

## 2017-08-05 DIAGNOSIS — Z7982 Long term (current) use of aspirin: Secondary | ICD-10-CM | POA: Insufficient documentation

## 2017-08-05 DIAGNOSIS — G5621 Lesion of ulnar nerve, right upper limb: Secondary | ICD-10-CM | POA: Diagnosis not present

## 2017-08-05 DIAGNOSIS — K219 Gastro-esophageal reflux disease without esophagitis: Secondary | ICD-10-CM | POA: Diagnosis not present

## 2017-08-05 DIAGNOSIS — I2510 Atherosclerotic heart disease of native coronary artery without angina pectoris: Secondary | ICD-10-CM | POA: Insufficient documentation

## 2017-08-05 DIAGNOSIS — K2270 Barrett's esophagus without dysplasia: Secondary | ICD-10-CM | POA: Diagnosis not present

## 2017-08-05 DIAGNOSIS — Z8711 Personal history of peptic ulcer disease: Secondary | ICD-10-CM | POA: Diagnosis not present

## 2017-08-05 DIAGNOSIS — Z8249 Family history of ischemic heart disease and other diseases of the circulatory system: Secondary | ICD-10-CM | POA: Diagnosis not present

## 2017-08-05 DIAGNOSIS — E785 Hyperlipidemia, unspecified: Secondary | ICD-10-CM | POA: Diagnosis not present

## 2017-08-05 DIAGNOSIS — I252 Old myocardial infarction: Secondary | ICD-10-CM | POA: Insufficient documentation

## 2017-08-05 DIAGNOSIS — Z87891 Personal history of nicotine dependence: Secondary | ICD-10-CM | POA: Insufficient documentation

## 2017-08-05 DIAGNOSIS — I25810 Atherosclerosis of coronary artery bypass graft(s) without angina pectoris: Secondary | ICD-10-CM | POA: Diagnosis not present

## 2017-08-05 DIAGNOSIS — I10 Essential (primary) hypertension: Secondary | ICD-10-CM | POA: Insufficient documentation

## 2017-08-05 DIAGNOSIS — Z79899 Other long term (current) drug therapy: Secondary | ICD-10-CM | POA: Insufficient documentation

## 2017-08-05 DIAGNOSIS — Z86010 Personal history of colonic polyps: Secondary | ICD-10-CM | POA: Insufficient documentation

## 2017-08-05 DIAGNOSIS — Z955 Presence of coronary angioplasty implant and graft: Secondary | ICD-10-CM | POA: Diagnosis not present

## 2017-08-05 DIAGNOSIS — G8918 Other acute postprocedural pain: Secondary | ICD-10-CM | POA: Diagnosis not present

## 2017-08-05 HISTORY — PX: ULNAR NERVE TRANSPOSITION: SHX2595

## 2017-08-05 SURGERY — ULNAR NERVE DECOMPRESSION/TRANSPOSITION
Anesthesia: General | Site: Elbow | Laterality: Right

## 2017-08-05 MED ORDER — HYDROCODONE-ACETAMINOPHEN 5-325 MG PO TABS: ORAL_TABLET | ORAL | 0 refills | Status: DC

## 2017-08-05 MED ORDER — CEFAZOLIN SODIUM-DEXTROSE 2-4 GM/100ML-% IV SOLN: 2.0000 g | INTRAVENOUS | Status: DC

## 2017-08-05 MED ORDER — LACTATED RINGERS IV SOLN
INTRAVENOUS | Status: DC | PRN
  Administered 2017-08-05 (×2): via INTRAVENOUS

## 2017-08-05 MED ORDER — FENTANYL CITRATE (PF) 100 MCG/2ML IJ SOLN
INTRAMUSCULAR | Status: AC
  Filled 2017-08-05: qty 2

## 2017-08-05 MED ORDER — PROPOFOL 10 MG/ML IV BOLUS
INTRAVENOUS | Status: DC | PRN
  Administered 2017-08-05: 150 mg via INTRAVENOUS

## 2017-08-05 MED ORDER — LIDOCAINE HCL (CARDIAC) 20 MG/ML IV SOLN
INTRAVENOUS | Status: DC | PRN
  Administered 2017-08-05: 30 mg via INTRAVENOUS

## 2017-08-05 MED ORDER — MIDAZOLAM HCL 2 MG/2ML IJ SOLN
INTRAMUSCULAR | Status: AC
  Filled 2017-08-05: qty 2

## 2017-08-05 MED ORDER — DEXAMETHASONE SODIUM PHOSPHATE 10 MG/ML IJ SOLN
INTRAMUSCULAR | Status: DC | PRN
  Administered 2017-08-05: 10 mg via INTRAVENOUS

## 2017-08-05 MED ORDER — FENTANYL CITRATE (PF) 100 MCG/2ML IJ SOLN
50.0000 ug | INTRAMUSCULAR | Status: DC | PRN
  Administered 2017-08-05: 50 ug via INTRAVENOUS

## 2017-08-05 MED ORDER — LIDOCAINE 2% (20 MG/ML) 5 ML SYRINGE
INTRAMUSCULAR | Status: AC
  Filled 2017-08-05: qty 5

## 2017-08-05 MED ORDER — CEFAZOLIN SODIUM-DEXTROSE 2-4 GM/100ML-% IV SOLN
INTRAVENOUS | Status: AC
  Filled 2017-08-05: qty 100

## 2017-08-05 MED ORDER — MIDAZOLAM HCL 2 MG/2ML IJ SOLN
1.0000 mg | INTRAMUSCULAR | Status: DC | PRN
  Administered 2017-08-05: 1 mg via INTRAVENOUS

## 2017-08-05 MED ORDER — FENTANYL CITRATE (PF) 100 MCG/2ML IJ SOLN
25.0000 ug | INTRAMUSCULAR | Status: DC | PRN
Start: 2017-08-05 — End: 2017-08-05

## 2017-08-05 MED ORDER — ONDANSETRON HCL 4 MG/2ML IJ SOLN: 4.0000 mg | Freq: Once | INTRAMUSCULAR | Status: DC | PRN

## 2017-08-05 MED ORDER — EPHEDRINE SULFATE 50 MG/ML IJ SOLN
INTRAMUSCULAR | Status: DC | PRN
  Administered 2017-08-05: 25 mg via INTRAVENOUS

## 2017-08-05 MED ORDER — DEXAMETHASONE SODIUM PHOSPHATE 10 MG/ML IJ SOLN
INTRAMUSCULAR | Status: AC
  Filled 2017-08-05: qty 1

## 2017-08-05 MED ORDER — CHLORHEXIDINE GLUCONATE 4 % EX LIQD: 60.0000 mL | Freq: Once | CUTANEOUS | Status: DC

## 2017-08-05 MED ORDER — EPHEDRINE 5 MG/ML INJ
INTRAVENOUS | Status: AC
  Filled 2017-08-05: qty 10

## 2017-08-05 MED ORDER — LACTATED RINGERS IV SOLN
INTRAVENOUS | Status: DC
  Administered 2017-08-05: 11:00:00 via INTRAVENOUS

## 2017-08-05 MED ORDER — GLYCOPYRROLATE 0.2 MG/ML IJ SOLN
INTRAMUSCULAR | Status: DC | PRN
  Administered 2017-08-05: 0.1 mg via INTRAVENOUS

## 2017-08-05 MED ORDER — CEFAZOLIN SODIUM-DEXTROSE 2-3 GM-% IV SOLR
INTRAVENOUS | Status: DC | PRN
  Administered 2017-08-05: 2 g via INTRAVENOUS

## 2017-08-05 MED ORDER — SCOPOLAMINE 1 MG/3DAYS TD PT72: 1.0000 | MEDICATED_PATCH | Freq: Once | TRANSDERMAL | Status: DC | PRN

## 2017-08-05 MED ORDER — ONDANSETRON HCL 4 MG/2ML IJ SOLN
INTRAMUSCULAR | Status: DC | PRN
  Administered 2017-08-05: 4 mg via INTRAVENOUS

## 2017-08-05 MED ORDER — ONDANSETRON HCL 4 MG/2ML IJ SOLN
INTRAMUSCULAR | Status: AC
  Filled 2017-08-05: qty 2

## 2017-08-05 MED ORDER — PROPOFOL 10 MG/ML IV BOLUS
INTRAVENOUS | Status: AC
  Filled 2017-08-05: qty 20

## 2017-08-05 SURGICAL SUPPLY — 59 items
BANDAGE ACE 3X5.8 VEL STRL LF (GAUZE/BANDAGES/DRESSINGS) ×3 IMPLANT
BANDAGE ACE 4X5 VEL STRL LF (GAUZE/BANDAGES/DRESSINGS) ×3 IMPLANT
BLADE MINI RND TIP GREEN BEAV (BLADE) IMPLANT
BLADE SURG 15 STRL LF DISP TIS (BLADE) ×2 IMPLANT
BLADE SURG 15 STRL SS (BLADE) ×4
BNDG ESMARK 4X9 LF (GAUZE/BANDAGES/DRESSINGS) ×3 IMPLANT
BNDG GAUZE ELAST 4 BULKY (GAUZE/BANDAGES/DRESSINGS) ×3 IMPLANT
CHLORAPREP W/TINT 26ML (MISCELLANEOUS) ×3 IMPLANT
CORD BIPOLAR FORCEPS 12FT (ELECTRODE) ×3 IMPLANT
COVER BACK TABLE 60X90IN (DRAPES) ×3 IMPLANT
COVER MAYO STAND STRL (DRAPES) ×3 IMPLANT
CUFF TOURN SGL LL 18 NRW (TOURNIQUET CUFF) ×3 IMPLANT
CUFF TOURNIQUET SINGLE 18IN (TOURNIQUET CUFF) IMPLANT
DECANTER SPIKE VIAL GLASS SM (MISCELLANEOUS) IMPLANT
DRAPE EXTREMITY T 121X128X90 (DRAPE) ×3 IMPLANT
DRAPE SURG 17X23 STRL (DRAPES) ×3 IMPLANT
DRSG PAD ABDOMINAL 8X10 ST (GAUZE/BANDAGES/DRESSINGS) ×3 IMPLANT
GAUZE SPONGE 4X4 12PLY STRL (GAUZE/BANDAGES/DRESSINGS) ×3 IMPLANT
GAUZE SPONGE 4X4 16PLY XRAY LF (GAUZE/BANDAGES/DRESSINGS) IMPLANT
GAUZE XEROFORM 1X8 LF (GAUZE/BANDAGES/DRESSINGS) ×3 IMPLANT
GLOVE BIO SURGEON STRL SZ 6.5 (GLOVE) ×2 IMPLANT
GLOVE BIO SURGEON STRL SZ7.5 (GLOVE) ×3 IMPLANT
GLOVE BIO SURGEONS STRL SZ 6.5 (GLOVE) ×1
GLOVE BIOGEL PI IND STRL 7.0 (GLOVE) ×2 IMPLANT
GLOVE BIOGEL PI IND STRL 8 (GLOVE) ×1 IMPLANT
GLOVE BIOGEL PI IND STRL 8.5 (GLOVE) ×1 IMPLANT
GLOVE BIOGEL PI INDICATOR 7.0 (GLOVE) ×4
GLOVE BIOGEL PI INDICATOR 8 (GLOVE) ×2
GLOVE BIOGEL PI INDICATOR 8.5 (GLOVE) ×2
GLOVE ECLIPSE 6.5 STRL STRAW (GLOVE) ×6 IMPLANT
GLOVE SURG ORTHO 8.0 STRL STRW (GLOVE) ×3 IMPLANT
GOWN STRL REUS W/ TWL LRG LVL3 (GOWN DISPOSABLE) ×2 IMPLANT
GOWN STRL REUS W/ TWL XL LVL3 (GOWN DISPOSABLE) ×1 IMPLANT
GOWN STRL REUS W/TWL LRG LVL3 (GOWN DISPOSABLE) ×4
GOWN STRL REUS W/TWL XL LVL3 (GOWN DISPOSABLE) ×8 IMPLANT
NEEDLE HYPO 25X1 1.5 SAFETY (NEEDLE) IMPLANT
NS IRRIG 1000ML POUR BTL (IV SOLUTION) ×3 IMPLANT
PACK BASIN DAY SURGERY FS (CUSTOM PROCEDURE TRAY) ×3 IMPLANT
PAD CAST 3X4 CTTN HI CHSV (CAST SUPPLIES) ×1 IMPLANT
PAD CAST 4YDX4 CTTN HI CHSV (CAST SUPPLIES) ×1 IMPLANT
PADDING CAST ABS 4INX4YD NS (CAST SUPPLIES) ×2
PADDING CAST ABS COTTON 4X4 ST (CAST SUPPLIES) ×1 IMPLANT
PADDING CAST COTTON 3X4 STRL (CAST SUPPLIES) ×2
PADDING CAST COTTON 4X4 STRL (CAST SUPPLIES) ×2
SLEEVE SCD COMPRESS KNEE MED (MISCELLANEOUS) ×3 IMPLANT
SLING ARM FOAM STRAP LRG (SOFTGOODS) ×3 IMPLANT
SPLINT FAST PLASTER 5X30 (CAST SUPPLIES)
SPLINT PLASTER CAST FAST 5X30 (CAST SUPPLIES) IMPLANT
SPLINT PLASTER CAST XFAST 3X15 (CAST SUPPLIES) IMPLANT
SPLINT PLASTER XTRA FASTSET 3X (CAST SUPPLIES)
STOCKINETTE 4X48 STRL (DRAPES) ×3 IMPLANT
SUT ETHILON 4 0 PS 2 18 (SUTURE) ×3 IMPLANT
SUT VIC AB 2-0 SH 27 (SUTURE) ×2
SUT VIC AB 2-0 SH 27XBRD (SUTURE) ×1 IMPLANT
SUT VICRYL 4-0 PS2 18IN ABS (SUTURE) IMPLANT
SYR BULB 3OZ (MISCELLANEOUS) ×3 IMPLANT
SYR CONTROL 10ML LL (SYRINGE) IMPLANT
TOWEL OR 17X24 6PK STRL BLUE (TOWEL DISPOSABLE) ×3 IMPLANT
UNDERPAD 30X30 (UNDERPADS AND DIAPERS) ×3 IMPLANT

## 2017-08-05 NOTE — Op Note (Signed)
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  PROCEDURE:  Right ulnar nerve decompression at the elbow.  SURGEON:  Leanora Cover, MD.  ASSISTANT:  Daryll Brod, MD.  ANESTHESIA:  General with regional.  INTRAVENOUS FLUIDS:  Per anesthesia flow sheet.  ESTIMATED BLOOD LOSS:  Minimal.  COMPLICATIONS:  None.  SPECIMENS:  None.  TOURNIQUET TIME:  21 minutes.  DISPOSITION:  Stable to PACU.  INDICATIONS:  [name] is a 71 year old male, who has been having numbness and tingling in the ulnar border of his hand and in the ring and small fingers.  He wished to have an ulnar nerve decompression for management of symptoms.  Risks, benefits, and alternatives of surgery were discussed including the risk of blood loss; infection; damage to nerves, vessels, tendons, ligaments, bone; failure of surgery; need for additional surgery; complications with wound healing; continued pain; and continued numbness.  He voiced understanding of these risks and elected to proceed.  OPERATIVE COURSE:  After being identified preoperatively by myself, the patient agreed upon procedure and site of procedure.  Surgical site was marked.  Risks, benefits, and alternatives of surgery were reviewed and he wished to proceed.  Surgical consent had been signed.  He was given IV Ancef as preoperative antibiotic prophylaxis.  A regional block was performed by Anesthesia in preoperative holding.  He was transferred to the operating room and placed on the operating room table in supine position with the right upper  extremity on arm board.  General anesthesia was induced by anesthesiologist.  The right upper extremity was prepped and draped in normal sterile orthopedic fashion.  Surgical pause was performed between surgeons, anesthesia, and operating room staff; and all were in agreement as to the patient, procedure, and site of procedure.  Tourniquet at the proximal aspect of the extremity was inflated to 250 mmHg after exsanguination of the limb with an Esmarch bandage.  Incision was made at the medial side of the elbow and carried into subcutaneous tissues by spreading technique.  Bipolar electrocautery was used to obtain hemostasis.  The ulnar nerve was identified at proximal Osborne's ligament.  It was traced proximally and carefully decompressed proximally.  This was done including the fascia and deep fascia.  At Osborne's ligament, there was noted to be a thick anconeus epitrochlearis.  This was released.  The Osborne's ligament was released as well.  The nerve was then decompressed distally between the heads of the FCU.  The fascia over the FCU was released and then the deep investing fascia over the nerve was released.  The motor branch to the FCU was identified and protected.  The elbow was placed through range of motion.  There was no subluxation of the ulnar nerve.  The anterior leaflet of Osborne's ligament was sewed to the subcutaneous tissue in the posterior skin leaflet.  This was done to prevent any potential nerve subluxation.  An instrument was able to be placed underneath and there was no compression of the nerve.  A single inverted interrupted Vicryl suture was placed in subcutaneous tissues and skin was closed with 4-0 nylon in a horizontal mattress fashion.  The wound was dressed with sterile Xeroform, 4x4s, and ABD and wrapped with Kerlix and Ace bandage.  Tourniquet was deflated at 21 minutes.  Fingertips were pink with brisk capillary refill after deflation of  tourniquet. The operative drapes were broken down.  The patient was awoken from anesthesia safely.  He was transferred back to stretcher and taken to PACU in stable condition.  I will see him back in the office in 1 week for postoperative followup.  I will give him Norco 5/325 one to two p.o. q.6 hours p.r.n. pain, dispensed #20.     Leanora Cover, MD     KK/MEDQ  D:  08/05/2017  T:  08/05/2017  Job:  [id number]

## 2017-08-05 NOTE — Anesthesia Procedure Notes (Signed)
Procedure Name: LMA Insertion Date/Time: 08/05/2017 11:31 AM Performed by: Toula Moos L Pre-anesthesia Checklist: Patient identified, Emergency Drugs available, Suction available, Patient being monitored and Timeout performed Patient Re-evaluated:Patient Re-evaluated prior to induction Oxygen Delivery Method: Circle system utilized Preoxygenation: Pre-oxygenation with 100% oxygen Induction Type: IV induction Ventilation: Mask ventilation without difficulty LMA: LMA inserted LMA Size: 5.0 Number of attempts: 1 Airway Equipment and Method: Bite block Placement Confirmation: positive ETCO2 Tube secured with: Tape Dental Injury: Teeth and Oropharynx as per pre-operative assessment

## 2017-08-05 NOTE — Transfer of Care (Signed)
Immediate Anesthesia Transfer of Care Note  Patient: Daniel Colon  Procedure(s) Performed: Procedure(s): RIGHT ULNAR NERVE DECOMPRESSION (Right)  Patient Location: PACU  Anesthesia Type:GA combined with regional for post-op pain  Level of Consciousness: sedated and patient cooperative  Airway & Oxygen Therapy: Patient Spontanous Breathing and Patient connected to face mask oxygen  Post-op Assessment: Report given to RN and Post -op Vital signs reviewed and stable  Post vital signs: Reviewed and stable  Last Vitals:  Vitals:   08/05/17 1115 08/05/17 1214  BP:    Pulse: (!) 50 69  Resp: 14 16  Temp:  (P) 36.7 C  SpO2: 100% 99%    Last Pain:  Vitals:   08/05/17 1049  TempSrc: Oral         Complications: No apparent anesthesia complications

## 2017-08-05 NOTE — Anesthesia Procedure Notes (Signed)
Anesthesia Regional Block: Supraclavicular block   Pre-Anesthetic Checklist: ,, timeout performed, Correct Patient, Correct Site, Correct Laterality, Correct Procedure, Correct Position, site marked, Risks and benefits discussed,  Surgical consent,  Pre-op evaluation,  At surgeon's request and post-op pain management  Laterality: Right  Prep: chloraprep       Needles:  Injection technique: Single-shot  Needle Type: Echogenic Needle     Needle Length: 9cm  Needle Gauge: 21     Additional Needles:   Procedures: ultrasound guided,,,,,,,,  Narrative:  Start time: 08/05/2017 11:07 AM End time: 08/05/2017 11:12 AM Injection made incrementally with aspirations every 5 mL.  Performed by: Personally  Anesthesiologist: Catalina Gravel  Additional Notes: No pain on injection. No increased resistance to injection. Injection made in 5cc increments.  Good needle visualization.  Patient tolerated procedure well.

## 2017-08-05 NOTE — Op Note (Signed)
[id number] 

## 2017-08-05 NOTE — Discharge Instructions (Addendum)

## 2017-08-05 NOTE — Progress Notes (Signed)
Assisted Dr. Gifford Shave with right, ultrasound guided, supraclavicular block. Side rails up, monitors on throughout procedure. See vital signs in flow sheet. Tolerated Procedure well.

## 2017-08-05 NOTE — Anesthesia Preprocedure Evaluation (Signed)
Anesthesia Evaluation  Patient identified by MRN, date of birth, ID band Patient awake    Reviewed: Allergy & Precautions, NPO status , Patient's Chart, lab work & pertinent test results  Airway Mallampati: II  TM Distance: >3 FB Neck ROM: Full    Dental  (+) Teeth Intact, Dental Advisory Given   Pulmonary former smoker,    Pulmonary exam normal breath sounds clear to auscultation       Cardiovascular hypertension, Pt. on home beta blockers and Pt. on medications + CAD, + Past MI and + Cardiac Stents  Normal cardiovascular exam Rhythm:Regular Rate:Normal     Neuro/Psych negative neurological ROS  negative psych ROS   GI/Hepatic Neg liver ROS, PUD, GERD  Medicated,  Endo/Other  negative endocrine ROSObesity   Renal/GU negative Renal ROS     Musculoskeletal negative musculoskeletal ROS (+)   Abdominal   Peds  Hematology negative hematology ROS (+)   Anesthesia Other Findings Day of surgery medications reviewed with the patient.  Reproductive/Obstetrics                             Anesthesia Physical Anesthesia Plan  ASA: III  Anesthesia Plan: General   Post-op Pain Management:  Regional for Post-op pain   Induction: Intravenous  PONV Risk Score and Plan: 2 and Ondansetron and Dexamethasone  Airway Management Planned: Oral ETT  Additional Equipment:   Intra-op Plan:   Post-operative Plan: Extubation in OR  Informed Consent: I have reviewed the patients History and Physical, chart, labs and discussed the procedure including the risks, benefits and alternatives for the proposed anesthesia with the patient or authorized representative who has indicated his/her understanding and acceptance.   Dental advisory given  Plan Discussed with: CRNA  Anesthesia Plan Comments: (Risks/benefits of general anesthesia discussed with patient including risk of damage to teeth, lips, gum, and  tongue, nausea/vomiting, allergic reactions to medications, and the possibility of heart attack, stroke and death.  All patient questions answered.  Patient wishes to proceed.)        Anesthesia Quick Evaluation

## 2017-08-05 NOTE — Anesthesia Postprocedure Evaluation (Signed)
Anesthesia Post Note  Patient: Daniel Colon  Procedure(s) Performed: Procedure(s) (LRB): RIGHT ULNAR NERVE DECOMPRESSION (Right)     Patient location during evaluation: PACU Anesthesia Type: General Level of consciousness: awake and alert Pain management: pain level controlled Vital Signs Assessment: post-procedure vital signs reviewed and stable Respiratory status: spontaneous breathing, nonlabored ventilation and respiratory function stable Cardiovascular status: blood pressure returned to baseline and stable Postop Assessment: no signs of nausea or vomiting Anesthetic complications: no    Last Vitals:  Vitals:   08/05/17 1215 08/05/17 1230  BP: 128/73 121/60  Pulse: 64 62  Resp: 14 18  Temp:    SpO2: 98% 97%    Last Pain:  Vitals:   08/05/17 1230  TempSrc:   PainSc: 0-No pain                 Catalina Gravel

## 2017-08-05 NOTE — H&P (Signed)
Daniel Colon is an 71 y.o. male.   Chief Complaint: right ulnar nerve compression HPI: 71 yo male with numbness in the ulnar side of the right hand.  He wishes to have ulnar nerve decompression with possible transposition.  Allergies: No Known Allergies  Past Medical History:  Diagnosis Date  . Allergy   . Arthritis   . Barrett's esophagus   . CAD (coronary artery disease)    a. Stent to the [facility] (in [place]);  b. ETT(11/13/13): abnormal with early ST changes to suggest ischemia;   c. LHC (11/17/13):  pLAD 30-40, D1 30, mLAD 70-75, mCFX 70, mRCA stent 40 (ISR), EF 55-65%.  Medical Rx  . Cataract    left eye removed, right immature  . Colon polyps   . Dyslipidemia   . GERD (gastroesophageal reflux disease)   . Hypertension   . Myocardial infarction ([place]) 1996  . Nephrolithiasis   . PUD (peptic ulcer disease)     Past Surgical History:  Procedure Laterality Date  . ANGIOPLASTY     stent - rt cor. art  . BILROTH II PROCEDURE    . COLONOSCOPY    . LEFT HEART CATHETERIZATION WITH CORONARY ANGIOGRAM N/A 11/17/2013   Procedure: LEFT HEART CATHETERIZATION WITH CORONARY ANGIOGRAM;  Surgeon: Blane Ohara, MD;  Location: [facility] CATH LAB;  Service: Cardiovascular;  Laterality: N/A;  . POLYPECTOMY    . ulcer surgery      Family History: Family History  Problem Relation Age of Onset  . Sudden death Mother 30  . CAD Brother 73       CABG  . CAD Sister 1       CABG  . CAD Sister 4       CABG  . Cancer Brother   . Liver disease Unknown   . Colon cancer Neg Hx   . Colon polyps Neg Hx   . Esophageal cancer Neg Hx   . Rectal cancer Neg Hx   . Stomach cancer Neg Hx     Social History:   reports that he quit smoking about 6 years ago. His smoking use included Cigarettes. He has a 42.00 pack-year smoking history. His smokeless tobacco use includes Chew. He reports that he drinks alcohol. He reports that he does not use drugs.  Medications: Medications Prior to Admission   Medication Sig Dispense Refill  . aspirin EC 81 MG tablet Take 1 tablet (81 mg total) by mouth daily. (Patient taking differently: Take 81 mg by mouth every other day. )    . atorvastatin (LIPITOR) 40 MG tablet TAKE 1 TABLET DAILY 90 tablet 3  . carvedilol (COREG) 12.5 MG tablet Take 12.5 mg by mouth 2 (two) times daily with a meal.    . Coenzyme Q10 (CO Q-10) 100 MG CAPS Take 200 mg by mouth daily.    Marland Kitchen ezetimibe (ZETIA) 10 MG tablet Take 10 mg by mouth daily.    . fish oil-omega-3 fatty acids 1000 MG capsule Take 2 g by mouth daily.    Marland Kitchen ipratropium (ATROVENT) 0.03 % nasal spray     . Multiple Vitamins-Minerals (MULTIVITAMIN PO) Take 1 tablet by mouth daily.    Marland Kitchen omeprazole (PRILOSEC) 10 MG capsule Take 10 mg by mouth daily.    Marland Kitchen telmisartan-hydrochlorothiazide (MICARDIS HCT) 80-12.5 MG per tablet Take 1 tablet by mouth daily.    . chlorhexidine (PERIDEX) 0.12 % solution daily.  0  . nitroGLYCERIN (NITROLINGUAL) 0.4 MG/SPRAY spray USE 1 SPRAY UNDER THE TONGUE  EVERY 5 MINUTES FOR 3 DOSES AS NEEDED FOR CHEST PAIN 24 g 2    No results found for this or any previous visit (from the past 48 hour(s)).  No results found.   A comprehensive review of systems was negative.  Height 5\' 8"  (1.727 m), weight 92.5 kg (204 lb).  General appearance: alert, cooperative and appears stated age Head: Normocephalic, without obvious abnormality, atraumatic Neck: supple, symmetrical, trachea midline Resp: clear to auscultation bilaterally Cardio: regular rate and rhythm GI: non-tender Extremities: Intact sensation and capillary refill all digits except right ring and small fingers with decreased sensation.  +epl/fpl/io.  No wounds.  Pulses: 2+ and symmetric Skin: Skin color, texture, turgor normal. No rashes or lesions Neurologic: Grossly normal Incision/Wound:none  Assessment/Plan Right ulnar nerve compression at elbow.  Non operative and operative treatment options were discussed with the patient  and patient wishes to proceed with operative treatment. He wishes to proceed with ulnar nerve decompression with possible transposition.  Risks, benefits, and alternatives of surgery were discussed and the patient agrees with the plan of care.   Yasmene Salomone R 08/05/2017, 10:20 AM

## 2017-08-05 NOTE — Brief Op Note (Signed)
08/05/2017  12:07 PM  PATIENT:  Daniel Colon  71 y.o. male  PRE-OPERATIVE DIAGNOSIS:  right ulnar nerve compression at elbow  G56.01  POST-OPERATIVE DIAGNOSIS:  right ulnar nerve compression at elbow  G56.01  PROCEDURE:  Procedure(s): RIGHT ULNAR NERVE DECOMPRESSION (Right)  SURGEON:  Surgeon(s) and Role:    * Leanora Cover, MD - Primary    * Daryll Brod, MD - Assisting  PHYSICIAN ASSISTANT:   ASSISTANTS: Daryll Brod, MD   ANESTHESIA:   regional and general  EBL:  Total I/O In: 1000 [I.V.:1000] Out: -   BLOOD ADMINISTERED:none  DRAINS: none   LOCAL MEDICATIONS USED:  NONE  SPECIMEN:  No Specimen  DISPOSITION OF SPECIMEN:  N/A  COUNTS:  YES  TOURNIQUET:   Total Tourniquet Time Documented: Upper Arm (Right) - 21 minutes Total: Upper Arm (Right) - 21 minutes   DICTATION: .Other Dictation: Dictation Number [id number]  PLAN OF CARE: Discharge to home after PACU  PATIENT DISPOSITION:  PACU - hemodynamically stable.

## 2017-08-05 NOTE — Op Note (Signed)
I assisted Surgeon(s) and Role:    * Leanora Cover, MD - Primary    Daryll Brod, MD - Assisting on the Procedure(s): RIGHT ULNAR NERVE DECOMPRESSION on 08/05/2017.  I provided assistance on this case as follows: setup, approach, postioning, retraction, release, closure a,d application of the dressing. I was present for the entire case.  Electronically signed by: Wynonia Sours, MD Date: 08/05/2017 Time: 12:07 PM

## 2017-08-06 ENCOUNTER — Encounter (HOSPITAL_BASED_OUTPATIENT_CLINIC_OR_DEPARTMENT_OTHER): Payer: Self-pay | Admitting: Orthopedic Surgery

## 2017-09-13 DIAGNOSIS — Z125 Encounter for screening for malignant neoplasm of prostate: Secondary | ICD-10-CM | POA: Diagnosis not present

## 2017-09-13 DIAGNOSIS — I10 Essential (primary) hypertension: Secondary | ICD-10-CM | POA: Diagnosis not present

## 2017-09-13 DIAGNOSIS — E784 Other hyperlipidemia: Secondary | ICD-10-CM | POA: Diagnosis not present

## 2017-09-20 DIAGNOSIS — Z1389 Encounter for screening for other disorder: Secondary | ICD-10-CM | POA: Diagnosis not present

## 2017-09-20 DIAGNOSIS — Z6830 Body mass index (BMI) 30.0-30.9, adult: Secondary | ICD-10-CM | POA: Diagnosis not present

## 2017-09-20 DIAGNOSIS — Z0000 Encounter for general adult medical examination without abnormal findings: Secondary | ICD-10-CM | POA: Diagnosis not present

## 2017-09-20 DIAGNOSIS — Z9861 Coronary angioplasty status: Secondary | ICD-10-CM | POA: Diagnosis not present

## 2017-09-20 DIAGNOSIS — N528 Other male erectile dysfunction: Secondary | ICD-10-CM | POA: Diagnosis not present

## 2017-09-20 DIAGNOSIS — G4700 Insomnia, unspecified: Secondary | ICD-10-CM | POA: Diagnosis not present

## 2017-09-20 DIAGNOSIS — Z23 Encounter for immunization: Secondary | ICD-10-CM | POA: Diagnosis not present

## 2017-09-20 DIAGNOSIS — E7849 Other hyperlipidemia: Secondary | ICD-10-CM | POA: Diagnosis not present

## 2017-09-20 DIAGNOSIS — M545 Low back pain: Secondary | ICD-10-CM | POA: Diagnosis not present

## 2017-09-20 DIAGNOSIS — K2270 Barrett's esophagus without dysplasia: Secondary | ICD-10-CM | POA: Diagnosis not present

## 2017-09-20 DIAGNOSIS — I10 Essential (primary) hypertension: Secondary | ICD-10-CM | POA: Diagnosis not present

## 2017-10-28 ENCOUNTER — Other Ambulatory Visit: Payer: Self-pay | Admitting: Acute Care

## 2017-10-28 DIAGNOSIS — Z122 Encounter for screening for malignant neoplasm of respiratory organs: Secondary | ICD-10-CM

## 2017-10-28 DIAGNOSIS — Z87891 Personal history of nicotine dependence: Secondary | ICD-10-CM

## 2017-11-08 DIAGNOSIS — Z6831 Body mass index (BMI) 31.0-31.9, adult: Secondary | ICD-10-CM | POA: Diagnosis not present

## 2017-11-08 DIAGNOSIS — I10 Essential (primary) hypertension: Secondary | ICD-10-CM | POA: Diagnosis not present

## 2017-11-08 DIAGNOSIS — Z23 Encounter for immunization: Secondary | ICD-10-CM | POA: Diagnosis not present

## 2017-11-08 DIAGNOSIS — M79645 Pain in left finger(s): Secondary | ICD-10-CM | POA: Diagnosis not present

## 2017-11-08 DIAGNOSIS — S61215A Laceration without foreign body of left ring finger without damage to nail, initial encounter: Secondary | ICD-10-CM | POA: Diagnosis not present

## 2017-11-09 DIAGNOSIS — M79645 Pain in left finger(s): Secondary | ICD-10-CM | POA: Diagnosis not present

## 2017-11-09 DIAGNOSIS — M7989 Other specified soft tissue disorders: Secondary | ICD-10-CM | POA: Diagnosis not present

## 2017-11-09 DIAGNOSIS — S61215D Laceration without foreign body of left ring finger without damage to nail, subsequent encounter: Secondary | ICD-10-CM | POA: Diagnosis not present

## 2017-11-15 DIAGNOSIS — S61215D Laceration without foreign body of left ring finger without damage to nail, subsequent encounter: Secondary | ICD-10-CM | POA: Diagnosis not present

## 2017-11-24 DIAGNOSIS — S61215A Laceration without foreign body of left ring finger without damage to nail, initial encounter: Secondary | ICD-10-CM | POA: Insufficient documentation

## 2017-12-01 ENCOUNTER — Other Ambulatory Visit: Payer: Self-pay | Admitting: Cardiology

## 2017-12-01 NOTE — Telephone Encounter (Signed)
Rx(s) sent to pharmacy electronically.  

## 2017-12-16 ENCOUNTER — Ambulatory Visit
Admission: RE | Admit: 2017-12-16 | Discharge: 2017-12-16 | Disposition: A | Discharge status: Home / Self Care | Payer: Medicare Other | Source: Ambulatory Visit | Attending: Acute Care | Admitting: Acute Care

## 2017-12-16 DIAGNOSIS — Z87891 Personal history of nicotine dependence: Secondary | ICD-10-CM | POA: Diagnosis not present

## 2017-12-16 DIAGNOSIS — Z122 Encounter for screening for malignant neoplasm of respiratory organs: Secondary | ICD-10-CM

## 2017-12-30 DIAGNOSIS — Z1389 Encounter for screening for other disorder: Secondary | ICD-10-CM | POA: Diagnosis not present

## 2017-12-30 DIAGNOSIS — I10 Essential (primary) hypertension: Secondary | ICD-10-CM | POA: Diagnosis not present

## 2017-12-30 DIAGNOSIS — K625 Hemorrhage of anus and rectum: Secondary | ICD-10-CM | POA: Diagnosis not present

## 2017-12-30 DIAGNOSIS — Z6831 Body mass index (BMI) 31.0-31.9, adult: Secondary | ICD-10-CM | POA: Diagnosis not present

## 2018-01-07 ENCOUNTER — Other Ambulatory Visit: Payer: Self-pay | Admitting: Acute Care

## 2018-01-07 DIAGNOSIS — Z122 Encounter for screening for malignant neoplasm of respiratory organs: Secondary | ICD-10-CM

## 2018-01-07 DIAGNOSIS — Z87891 Personal history of nicotine dependence: Secondary | ICD-10-CM

## 2018-02-10 ENCOUNTER — Encounter: Payer: Self-pay | Admitting: Cardiology

## 2018-02-16 NOTE — Progress Notes (Signed)
HPI The patient presents for follow up of  CAD, s/p stent to RCA in 1996 in [place].  RoutineETT(11/13/13) was abnormal with early ST changes to suggest ischemia. He was set up had LHC (11/17/13): pLAD 30-40, D1 30, mLAD 70-75, mCFX 70, mRCA stent 40 (ISR), EF 55-65%.  He was managed medically.  Since that time he has done well. The patient denies any new symptoms such as chest discomfort, neck or arm discomfort. There has been no new shortness of breath, PND or orthopnea. There have been no reported palpitations, presyncope or syncope.  He has been golfing.   He exercises routinely.  The patient denies any new symptoms such as chest discomfort, neck or arm discomfort. There has been no new shortness of breath, PND or orthopnea. There have been no reported palpitations, presyncope or syncope. He has not used any NTG.  He might get some leg fatigue.    No Known Allergies  Current Outpatient Medications  Medication Sig Dispense Refill  . aspirin EC 81 MG tablet Take 1 tablet (81 mg total) by mouth daily. (Patient taking differently: Take 81 mg by mouth every other day. )    . atorvastatin (LIPITOR) 40 MG tablet TAKE 1 TABLET DAILY 90 tablet 1  . carvedilol (COREG) 12.5 MG tablet Take 12.5 mg by mouth 2 (two) times daily with a meal.    . Coenzyme Q10 (CO Q-10) 100 MG CAPS Take 200 mg by mouth daily.    Marland Kitchen ezetimibe (ZETIA) 10 MG tablet Take 10 mg by mouth daily.    . fish oil-omega-3 fatty acids 1000 MG capsule Take 2 g by mouth daily.    Marland Kitchen ipratropium (ATROVENT) 0.03 % nasal spray     . Multiple Vitamins-Minerals (MULTIVITAMIN PO) Take 1 tablet by mouth daily.    . nitroGLYCERIN (NITROLINGUAL) 0.4 MG/SPRAY spray Place 1 spray under the tongue every 5 (five) minutes x 3 doses as needed for chest pain. 24 g 2  . omeprazole (PRILOSEC) 10 MG capsule Take 10 mg by mouth daily.    Marland Kitchen telmisartan-hydrochlorothiazide (MICARDIS HCT) 80-12.5 MG per tablet Take 1 tablet by mouth daily.     No  current facility-administered medications for this visit.     Past Medical History:  Diagnosis Date  . Allergy   . Arthritis   . Barrett's esophagus   . CAD (coronary artery disease)    a. Stent to the [facility] (in [place]);  b. ETT(11/13/13): abnormal with early ST changes to suggest ischemia;   c. LHC (11/17/13):  pLAD 30-40, D1 30, mLAD 70-75, mCFX 70, mRCA stent 40 (ISR), EF 55-65%.  Medical Rx  . Cataract    left eye removed, right immature  . Colon polyps   . Dyslipidemia   . GERD (gastroesophageal reflux disease)   . Hypertension   . Myocardial infarction ([place]) 1996  . Nephrolithiasis   . PUD (peptic ulcer disease)     Past Surgical History:  Procedure Laterality Date  . ANGIOPLASTY     stent - rt cor. art  . BILROTH II PROCEDURE    . COLONOSCOPY    . LEFT HEART CATHETERIZATION WITH CORONARY ANGIOGRAM N/A 11/17/2013   Procedure: LEFT HEART CATHETERIZATION WITH CORONARY ANGIOGRAM;  Surgeon: Blane Ohara, MD;  Location: [facility] CATH LAB;  Service: Cardiovascular;  Laterality: N/A;  . POLYPECTOMY    . ulcer surgery    . ULNAR NERVE TRANSPOSITION Right 08/05/2017   Procedure: RIGHT ULNAR NERVE DECOMPRESSION;  Surgeon: Leanora Cover, MD;  Location: [facility];  Service: Orthopedics;  Laterality: Right;    ROS:  As stated in the HPI and negative for all other systems.  PHYSICAL EXAM BP 136/72   Pulse 75   Ht 5' 8.5" (1.74 m)   Wt 207 lb (93.9 kg)   BMI 31.02 kg/m   GENERAL:  Well appearing NECK:  No jugular venous distention, waveform within normal limits, carotid upstroke brisk and symmetric, no bruits, no thyromegaly LUNGS:  Clear to auscultation bilaterally CHEST:  Unremarkable HEART:  PMI not displaced or sustained,S1 and S2 within normal limits, no S3, no S4, no clicks, no rubs, no murmurs ABD:  Flat, positive bowel sounds normal in frequency in pitch, no bruits, no rebound, no guarding, no midline pulsatile mass, no hepatomegaly, no splenomegaly EXT:   2 plus pulses throughout, no edema, no cyanosis no clubbing    EKG:    Sinus rhythm, rate 75, axis within normal limits, intervals within normal limits, no acute ST-T wave changes.  He did have some ST depression mildly in nonspecific in the inferolateral leads.  No change from previous.  02/17/2018  ASSESSMENT AND PLAN    CAD:  The patient has no new sypmtoms.  No further cardiovascular testing is indicated.  We will continue with aggressive risk reduction and meds as listed.  He has false positive POET (Plain Old Exercise Treadmill)s.  I do not see an indication for nuclear study which would be necessary if we are going to screen him.  He will let me know if he has any symptoms in the urine I do have a low threshold given his extensive history and he is in particular a very extensive family   DYSLIPIDEMIA: LDL was 63 with an HDL of 47.  This is followed byPaterson, Daniel, MD .  No change in therapy is indicated.  HTN:   Blood pressure is at target.  He will continue current meds.

## 2018-02-17 ENCOUNTER — Encounter: Payer: Self-pay | Admitting: Cardiology

## 2018-02-17 ENCOUNTER — Ambulatory Visit (INDEPENDENT_AMBULATORY_CARE_PROVIDER_SITE_OTHER): Payer: Medicare Other | Admitting: Cardiology

## 2018-02-17 VITALS — BP 136/72 | HR 75 | Ht 68.5 in | Wt 207.0 lb

## 2018-02-17 DIAGNOSIS — I2510 Atherosclerotic heart disease of native coronary artery without angina pectoris: Secondary | ICD-10-CM | POA: Diagnosis not present

## 2018-02-17 DIAGNOSIS — I10 Essential (primary) hypertension: Secondary | ICD-10-CM

## 2018-02-17 DIAGNOSIS — E785 Hyperlipidemia, unspecified: Secondary | ICD-10-CM

## 2018-02-17 MED ORDER — NITROGLYCERIN 0.4 MG/SPRAY TL SOLN: 1.0000 | 2 refills | Status: DC | PRN

## 2018-02-17 NOTE — Patient Instructions (Signed)
Medication Instructions:  Continue current medications  If you need a refill on your cardiac medications before your next appointment, please call your pharmacy.  Labwork: None ordered  Testing/Procedures: None Ordered   Follow-Up: Your physician wants you to follow-up in: 1 Year. You should receive a reminder letter in the mail two months in advance. If you do not receive a letter, please call our office [phone].    Thank you for choosing [facility] at [facility]!!

## 2018-05-30 ENCOUNTER — Other Ambulatory Visit: Payer: Self-pay | Admitting: Cardiology

## 2018-10-06 DIAGNOSIS — E7849 Other hyperlipidemia: Secondary | ICD-10-CM | POA: Diagnosis not present

## 2018-10-06 DIAGNOSIS — Z125 Encounter for screening for malignant neoplasm of prostate: Secondary | ICD-10-CM | POA: Diagnosis not present

## 2018-10-06 DIAGNOSIS — I10 Essential (primary) hypertension: Secondary | ICD-10-CM | POA: Diagnosis not present

## 2018-10-06 DIAGNOSIS — R82998 Other abnormal findings in urine: Secondary | ICD-10-CM | POA: Diagnosis not present

## 2018-10-09 DIAGNOSIS — Z23 Encounter for immunization: Secondary | ICD-10-CM | POA: Diagnosis not present

## 2018-10-14 DIAGNOSIS — H9193 Unspecified hearing loss, bilateral: Secondary | ICD-10-CM | POA: Diagnosis not present

## 2018-10-14 DIAGNOSIS — K2270 Barrett's esophagus without dysplasia: Secondary | ICD-10-CM | POA: Diagnosis not present

## 2018-10-14 DIAGNOSIS — Z6830 Body mass index (BMI) 30.0-30.9, adult: Secondary | ICD-10-CM | POA: Diagnosis not present

## 2018-10-14 DIAGNOSIS — I10 Essential (primary) hypertension: Secondary | ICD-10-CM | POA: Diagnosis not present

## 2018-10-14 DIAGNOSIS — Z9861 Coronary angioplasty status: Secondary | ICD-10-CM | POA: Diagnosis not present

## 2018-10-14 DIAGNOSIS — Z1389 Encounter for screening for other disorder: Secondary | ICD-10-CM | POA: Diagnosis not present

## 2018-10-14 DIAGNOSIS — K219 Gastro-esophageal reflux disease without esophagitis: Secondary | ICD-10-CM | POA: Diagnosis not present

## 2018-10-14 DIAGNOSIS — Z0000 Encounter for general adult medical examination without abnormal findings: Secondary | ICD-10-CM | POA: Diagnosis not present

## 2018-10-14 DIAGNOSIS — Z1212 Encounter for screening for malignant neoplasm of rectum: Secondary | ICD-10-CM | POA: Diagnosis not present

## 2018-10-14 DIAGNOSIS — M545 Low back pain: Secondary | ICD-10-CM | POA: Diagnosis not present

## 2018-10-14 DIAGNOSIS — Z87891 Personal history of nicotine dependence: Secondary | ICD-10-CM | POA: Diagnosis not present

## 2018-10-14 DIAGNOSIS — E7849 Other hyperlipidemia: Secondary | ICD-10-CM | POA: Diagnosis not present

## 2018-10-14 DIAGNOSIS — N529 Male erectile dysfunction, unspecified: Secondary | ICD-10-CM | POA: Diagnosis not present

## 2018-10-24 DIAGNOSIS — H43392 Other vitreous opacities, left eye: Secondary | ICD-10-CM | POA: Diagnosis not present

## 2018-12-19 ENCOUNTER — Ambulatory Visit
Admission: RE | Admit: 2018-12-19 | Discharge: 2018-12-19 | Disposition: A | Discharge status: Home / Self Care | Payer: Medicare Other | Source: Ambulatory Visit | Attending: Acute Care | Admitting: Acute Care

## 2018-12-19 DIAGNOSIS — Z122 Encounter for screening for malignant neoplasm of respiratory organs: Secondary | ICD-10-CM

## 2018-12-19 DIAGNOSIS — Z87891 Personal history of nicotine dependence: Secondary | ICD-10-CM

## 2018-12-23 ENCOUNTER — Telehealth: Payer: Self-pay | Admitting: Acute Care

## 2018-12-23 DIAGNOSIS — Z122 Encounter for screening for malignant neoplasm of respiratory organs: Secondary | ICD-10-CM

## 2018-12-23 DIAGNOSIS — Z87891 Personal history of nicotine dependence: Secondary | ICD-10-CM

## 2018-12-27 NOTE — Telephone Encounter (Signed)
Pt is calling back for lung screen CT results. Cb is [phone].

## 2018-12-28 NOTE — Telephone Encounter (Signed)
Pt informed of CT results per Sarah Groce, NP.  PT verbalized understanding.  Copy sent to PCP.  Order placed for 1 yr f/u CT.  

## 2019-01-09 DIAGNOSIS — D225 Melanocytic nevi of trunk: Secondary | ICD-10-CM | POA: Diagnosis not present

## 2019-01-09 DIAGNOSIS — L720 Epidermal cyst: Secondary | ICD-10-CM | POA: Diagnosis not present

## 2019-01-09 DIAGNOSIS — D0439 Carcinoma in situ of skin of other parts of face: Secondary | ICD-10-CM | POA: Diagnosis not present

## 2019-01-09 DIAGNOSIS — L821 Other seborrheic keratosis: Secondary | ICD-10-CM | POA: Diagnosis not present

## 2019-01-09 DIAGNOSIS — D1801 Hemangioma of skin and subcutaneous tissue: Secondary | ICD-10-CM | POA: Diagnosis not present

## 2019-02-01 ENCOUNTER — Encounter: Payer: Self-pay | Admitting: Cardiology

## 2019-02-13 ENCOUNTER — Telehealth: Payer: Self-pay | Admitting: Cardiology

## 2019-02-13 NOTE — Telephone Encounter (Signed)
Spoke with pt, per nya, patient double booked with his wife on Thursday morning.

## 2019-02-13 NOTE — Telephone Encounter (Signed)
Pt called. He needs to go out of town Thursday afternoon. His wife has an appt on Thursday morning,  and would like to know if it was ok if Dr. Percival Spanish would see him at his wife's appt. He did not want to reschedule to a later day.  He said he will keep his original appt if this isn't ok, but he wanted to ask first

## 2019-02-14 NOTE — Progress Notes (Signed)
HPI The patient presents for follow up of  CAD, s/p stent to RCA in 1996 in [place].  RoutineETT(11/13/13) was abnormal with early ST changes to suggest ischemia. He was set up had LHC (11/17/13): pLAD 30-40, D1 30, mLAD 70-75, mCFX 70, mRCA stent 40 (ISR), EF 55-65%.  He was managed medically.  He returns for follow up.    Since I last saw him he has done well.  The patient denies any new symptoms such as chest discomfort, neck or arm discomfort. There has been no new shortness of breath, PND or orthopnea. There have been no reported palpitations, presyncope or syncope. He golfs and goes to the gym.   No Known Allergies  Current Outpatient Medications  Medication Sig Dispense Refill  . aspirin EC 81 MG tablet Take 81 mg by mouth every other day.    Marland Kitchen atorvastatin (LIPITOR) 40 MG tablet TAKE 1 TABLET DAILY 90 tablet 3  . carvedilol (COREG) 12.5 MG tablet Take 12.5 mg by mouth 2 (two) times daily with a meal.    . Coenzyme Q10 (CO Q-10) 100 MG CAPS Take 200 mg by mouth daily.    Marland Kitchen ezetimibe (ZETIA) 10 MG tablet Take 10 mg by mouth daily.    . fish oil-omega-3 fatty acids 1000 MG capsule Take 2 g by mouth daily.    Marland Kitchen ipratropium (ATROVENT) 0.03 % nasal spray     . Multiple Vitamins-Minerals (MULTIVITAMIN PO) Take 1 tablet by mouth daily.    . nitroGLYCERIN (NITROLINGUAL) 0.4 MG/SPRAY spray Place 1 spray under the tongue every 5 (five) minutes x 3 doses as needed for chest pain. 24 g 2  . omeprazole (PRILOSEC) 10 MG capsule Take 10 mg by mouth daily.    Marland Kitchen telmisartan-hydrochlorothiazide (MICARDIS HCT) 80-12.5 MG per tablet Take 1 tablet by mouth daily.     No current facility-administered medications for this visit.     Past Medical History:  Diagnosis Date  . Allergy   . Arthritis   . Barrett's esophagus   . CAD (coronary artery disease)    a. Stent to the [place] (in [place]);  b. ETT(11/13/13): abnormal with early ST changes to suggest ischemia;   c. LHC (11/17/13):  pLAD  30-40, D1 30, mLAD 70-75, mCFX 70, mRCA stent 40 (ISR), EF 55-65%.  Medical Rx  . Cataract    left eye removed, right immature  . Colon polyps   . Dyslipidemia   . GERD (gastroesophageal reflux disease)   . Hypertension   . Myocardial infarction ([place]) 1996  . Nephrolithiasis   . PUD (peptic ulcer disease)     Past Surgical History:  Procedure Laterality Date  . ANGIOPLASTY     stent - rt cor. art  . BILROTH II PROCEDURE    . COLONOSCOPY    . LEFT HEART CATHETERIZATION WITH CORONARY ANGIOGRAM N/A 11/17/2013   Procedure: LEFT HEART CATHETERIZATION WITH CORONARY ANGIOGRAM;  Surgeon: Blane Ohara, MD;  Location[facility] C[facility] LAB;  Service: Cardiovascular;  Laterality: N/A;  . POLYPECTOMY    . ulcer surgery    . ULNAR NERVE TRANSPOSITION Right 08/05/2017   Procedure: RIGHT ULNAR NERVE DECOMPRESSION;  Surgeon: Leanora Cover, MD;  Location: [place];  Service: Orthopedics;  Laterality: Right;    ROS:  As stated in the HPI and negative for all other systems.   PHYSICAL EXAM BP 122/70   Pulse 63   Ht 5' 8.5" (1.74 m)   Wt 206  lb 3.2 oz (93.5 kg)   BMI 30.90 kg/m   GENERAL:  Well appearing NECK:  No jugular venous distention, waveform within normal limits, carotid upstroke brisk and symmetric, no bruits, no thyromegaly LUNGS:  Clear to auscultation bilaterally CHEST:  Unremarkable HEART:  PMI not displaced or sustained,S1 and S2 within normal limits, no S3, no S4, no clicks, no rubs, no murmurs ABD:  Flat, positive bowel sounds normal in frequency in pitch, no bruits, no rebound, no guarding, no midline pulsatile mass, no hepatomegaly, no splenomegaly EXT:  2 plus pulses throughout, no edema, no cyanosis no clubbing    EKG:    Sinus rhythm, rate 63, axis within normal limits, intervals within normal limits, no acute ST-T wave changes.  He did have some ST depression mildly in nonspecific in the inferolateral leads.  No change from previous.  02/16/2019      ASSESSMENT AND PLAN    CAD:   The patient has no symptoms.  He had a false positive treadmill test some years ago so not can be screening him with this.  Instead he will continue with aggressive risk reduction.  No further testing is planned at this point.  DYSLIPIDEMIA: LDL was 59.  HDL 41.  He will continue the meds as listed.  He eats a lot of meat.  I will have him watch the documentary [employer].  HTN: Blood pressure is well controlled.  He will continue on the meds as listed.

## 2019-02-16 ENCOUNTER — Ambulatory Visit (INDEPENDENT_AMBULATORY_CARE_PROVIDER_SITE_OTHER): Payer: Medicare Other | Admitting: Cardiology

## 2019-02-16 ENCOUNTER — Ambulatory Visit: Payer: Medicare Other | Admitting: Cardiology

## 2019-02-16 ENCOUNTER — Encounter: Payer: Self-pay | Admitting: Cardiology

## 2019-02-16 VITALS — BP 122/70 | HR 63 | Ht 68.5 in | Wt 206.2 lb

## 2019-02-16 DIAGNOSIS — E785 Hyperlipidemia, unspecified: Secondary | ICD-10-CM

## 2019-02-16 DIAGNOSIS — I2510 Atherosclerotic heart disease of native coronary artery without angina pectoris: Secondary | ICD-10-CM

## 2019-02-16 DIAGNOSIS — I10 Essential (primary) hypertension: Secondary | ICD-10-CM

## 2019-02-16 NOTE — Patient Instructions (Signed)
Medication Instructions:  Continue current medications  If you need a refill on your cardiac medications before your next appointment, please call your pharmacy.  Labwork: None Ordered   Testing/Procedures: None Ordered   Follow-Up: You will need a follow up appointment in 6 months.  Please call our office 2 months in advance to schedule this appointment.  You may see Dr Percival Spanish or one of the following Advanced Practice Providers on your designated Care Team:   Rosaria Ferries, PA-C . Jory Sims, DNP, ANP    At [facility], you and your health needs are our priority.  As part of our continuing mission to provide you with exceptional heart care, we have created designated Provider Care Teams.  These Care Teams include your primary Cardiologist (physician) and Advanced Practice Providers (APPs -  Physician Assistants and Nurse Practitioners) who all work together to provide you with the care you need, when you need it.  Thank you for choosing [facility] at [facility]!!

## 2019-02-27 ENCOUNTER — Other Ambulatory Visit: Payer: Self-pay | Admitting: Cardiology

## 2019-05-25 ENCOUNTER — Other Ambulatory Visit: Payer: Self-pay | Admitting: Cardiology

## 2019-09-11 ENCOUNTER — Ambulatory Visit: Payer: Medicare Other | Admitting: Cardiology

## 2019-09-30 DIAGNOSIS — Z23 Encounter for immunization: Secondary | ICD-10-CM | POA: Diagnosis not present

## 2019-10-01 DIAGNOSIS — E785 Hyperlipidemia, unspecified: Secondary | ICD-10-CM | POA: Insufficient documentation

## 2019-10-01 NOTE — Progress Notes (Signed)
HPI The patient presents for follow up of  CAD, s/p stent to RCA in 1996 in [place].  RoutineETT(11/13/13) was abnormal with early ST changes to suggest ischemia. He was set up had LHC (11/17/13): pLAD 30-40, D1 30, mLAD 70-75, mCFX 70, mRCA stent 40 (ISR), EF 55-65%.  He was managed medically.  He returns for follow up.    Since I last saw him he has done well.  He does some exercising with his wife.  He has had no new chest pressure, neck or arm discomfort.  Has had no palpitations, presyncope or syncope.  He does some walking for exercise and golfing.  No Known Allergies  Current Outpatient Medications  Medication Sig Dispense Refill  . aspirin EC 81 MG tablet Take 81 mg by mouth every other day.    Marland Kitchen atorvastatin (LIPITOR) 40 MG tablet TAKE 1 TABLET DAILY 90 tablet 3  . carvedilol (COREG) 12.5 MG tablet Take 12.5 mg by mouth 2 (two) times daily with a meal.    . Coenzyme Q10 (CO Q-10) 100 MG CAPS Take 200 mg by mouth daily.    Marland Kitchen ezetimibe (ZETIA) 10 MG tablet Take 10 mg by mouth daily.    . fish oil-omega-3 fatty acids 1000 MG capsule Take 2 g by mouth daily.    Marland Kitchen ipratropium (ATROVENT) 0.03 % nasal spray     . Multiple Vitamins-Minerals (MULTIVITAMIN PO) Take 1 tablet by mouth daily.    . nitroGLYCERIN (NITROLINGUAL) 0.4 MG/SPRAY spray USE 1 SPRAY ON OR UNDER THE TONGUE EVERY 5 MINUTES FOR 3 DOSES AS NEEDED FOR CHEST PAIN AS DIRECTED BY PRESCRIBER OR PACKAGE INSTRUCTIONS 24 g 3  . omeprazole (PRILOSEC) 10 MG capsule Take 10 mg by mouth daily.    Marland Kitchen telmisartan-hydrochlorothiazide (MICARDIS HCT) 80-12.5 MG tablet Take 1 tablet by mouth daily. 90 tablet 3   No current facility-administered medications for this visit.     Past Medical History:  Diagnosis Date  . Allergy   . Arthritis   . Barrett's esophagus   . CAD (coronary artery disease)    a. Stent to the [place] (in [place]);  b. ETT(11/13/13): abnormal with early ST changes to suggest ischemia;   c. LHC (11/17/13):   pLAD 30-40, D1 30, mLAD 70-75, mCFX 70, mRCA stent 40 (ISR), EF 55-65%.  Medical Rx  . Cataract    left eye removed, right immature  . Colon polyps   . Dyslipidemia   . GERD (gastroesophageal reflux disease)   . Hypertension   . Myocardial infarction ([place]) 1996  . Nephrolithiasis   . PUD (peptic ulcer disease)     Past Surgical History:  Procedure Laterality Date  . ANGIOPLASTY     stent - rt cor. art  . BILROTH II PROCEDURE    . COLONOSCOPY    . LEFT HEART CATHETERIZATION WITH CORONARY ANGIOGRAM N/A 11/17/2013   Procedure: LEFT HEART CATHETERIZATION WITH CORONARY ANGIOGRAM;  Surgeon: Blane Ohara, MD;  Location: [facility] CATH LAB;  Service: Cardiovascular;  Laterality: N/A;  . POLYPECTOMY    . ulcer surgery    . ULNAR NERVE TRANSPOSITION Right 08/05/2017   Procedure: RIGHT ULNAR NERVE DECOMPRESSION;  Surgeon: Leanora Cover, MD;  Location: [place];  Service: Orthopedics;  Laterality: Right;    ROS:  As stated in the HPI and negative for all other systems.   PHYSICAL EXAM BP (!) 147/66   Pulse 62   Temp (!) 97.2 F (36.2 C)  Ht 5' 8.5" (1.74 m)   Wt 202 lb 3.2 oz (91.7 kg)   SpO2 95%   BMI 30.30 kg/m   GENERAL:  Well appearing NECK:  No jugular venous distention, waveform within normal limits, carotid upstroke brisk and symmetric, no bruits, no thyromegaly LUNGS:  Clear to auscultation bilaterally CHEST:  Unremarkable HEART:  PMI not displaced or sustained,S1 and S2 within normal limits, no S3, no S4, no clicks, no rubs, no murmurs ABD:  Flat, positive bowel sounds normal in frequency in pitch, no bruits, no rebound, no guarding, no midline pulsatile mass, no hepatomegaly, no splenomegaly EXT:  2 plus pulses throughout, no edema, no cyanosis no clubbing    ASSESSMENT AND PLAN    CAD:   The patient has no new sypmtoms.  No further cardiovascular testing is indicated.  We will continue with aggressive risk reduction and meds as listed.  I did review his  coronary anatomy with him.  He has not me know if he has any future symptoms but currently he is able to be physically active without symptoms.  He is on optimal medical therapy.  DYSLIPIDEMIA: LDL was 59 with an HDL of 41.  He is due to have this checked this month.  No change in therapy.   HTN: Blood pressure is elevated today but he says it is always in the 650P systolic at home.  No change in therapy.

## 2019-10-02 ENCOUNTER — Ambulatory Visit (INDEPENDENT_AMBULATORY_CARE_PROVIDER_SITE_OTHER): Payer: Medicare Other | Admitting: Cardiology

## 2019-10-02 ENCOUNTER — Encounter: Payer: Self-pay | Admitting: Cardiology

## 2019-10-02 ENCOUNTER — Other Ambulatory Visit: Payer: Self-pay

## 2019-10-02 VITALS — BP 147/66 | HR 62 | Temp 97.2°F | Ht 68.5 in | Wt 202.2 lb

## 2019-10-02 DIAGNOSIS — I10 Essential (primary) hypertension: Secondary | ICD-10-CM

## 2019-10-02 DIAGNOSIS — I2510 Atherosclerotic heart disease of native coronary artery without angina pectoris: Secondary | ICD-10-CM

## 2019-10-02 DIAGNOSIS — E785 Hyperlipidemia, unspecified: Secondary | ICD-10-CM

## 2019-10-02 MED ORDER — TELMISARTAN-HCTZ 80-12.5 MG PO TABS: 1.0000 | ORAL_TABLET | Freq: Every day | ORAL | 3 refills | Status: DC

## 2019-10-02 NOTE — Patient Instructions (Signed)
Medication Instructions:  Your physician recommends that you continue on your current medications as directed. Please refer to the Current Medication list given to you today.  If you need a refill on your cardiac medications before your next appointment, please call your pharmacy.   Lab work: NONE  Testing/Procedures: NONE  Follow-Up: At [facility], you and your health needs are our priority.  As part of our continuing mission to provide you with exceptional heart care, we have created designated Provider Care Teams.  These Care Teams include your primary Cardiologist (physician) and Advanced Practice Providers (APPs -  Physician Assistants and Nurse Practitioners) who all work together to provide you with the care you need, when you need it. You will need a follow up appointment in 12 months.  Please call our office 2 months in advance to schedule this appointment.  You may see Dr. Percival Spanish or one of the following Advanced Practice Providers on your designated Care Team:   Rosaria Ferries, PA-C Jory Sims, DNP, ANP

## 2019-10-23 DIAGNOSIS — E7849 Other hyperlipidemia: Secondary | ICD-10-CM | POA: Diagnosis not present

## 2019-10-23 DIAGNOSIS — Z125 Encounter for screening for malignant neoplasm of prostate: Secondary | ICD-10-CM | POA: Diagnosis not present

## 2019-10-26 DIAGNOSIS — I10 Essential (primary) hypertension: Secondary | ICD-10-CM | POA: Diagnosis not present

## 2019-10-26 DIAGNOSIS — R82998 Other abnormal findings in urine: Secondary | ICD-10-CM | POA: Diagnosis not present

## 2019-10-30 DIAGNOSIS — E785 Hyperlipidemia, unspecified: Secondary | ICD-10-CM | POA: Diagnosis not present

## 2019-10-30 DIAGNOSIS — N200 Calculus of kidney: Secondary | ICD-10-CM | POA: Diagnosis not present

## 2019-10-30 DIAGNOSIS — R972 Elevated prostate specific antigen [PSA]: Secondary | ICD-10-CM | POA: Diagnosis not present

## 2019-10-30 DIAGNOSIS — Z1331 Encounter for screening for depression: Secondary | ICD-10-CM | POA: Diagnosis not present

## 2019-10-30 DIAGNOSIS — N3281 Overactive bladder: Secondary | ICD-10-CM | POA: Diagnosis not present

## 2019-10-30 DIAGNOSIS — Z1339 Encounter for screening examination for other mental health and behavioral disorders: Secondary | ICD-10-CM | POA: Diagnosis not present

## 2019-10-30 DIAGNOSIS — H9190 Unspecified hearing loss, unspecified ear: Secondary | ICD-10-CM | POA: Diagnosis not present

## 2019-10-30 DIAGNOSIS — K2270 Barrett's esophagus without dysplasia: Secondary | ICD-10-CM | POA: Diagnosis not present

## 2019-10-30 DIAGNOSIS — Z0000 Encounter for general adult medical examination without abnormal findings: Secondary | ICD-10-CM | POA: Diagnosis not present

## 2019-10-30 DIAGNOSIS — K921 Melena: Secondary | ICD-10-CM | POA: Diagnosis not present

## 2019-10-30 DIAGNOSIS — I10 Essential (primary) hypertension: Secondary | ICD-10-CM | POA: Diagnosis not present

## 2019-10-30 DIAGNOSIS — E669 Obesity, unspecified: Secondary | ICD-10-CM | POA: Diagnosis not present

## 2019-10-30 DIAGNOSIS — Z9861 Coronary angioplasty status: Secondary | ICD-10-CM | POA: Diagnosis not present

## 2019-10-30 DIAGNOSIS — K219 Gastro-esophageal reflux disease without esophagitis: Secondary | ICD-10-CM | POA: Diagnosis not present

## 2019-10-30 DIAGNOSIS — B356 Tinea cruris: Secondary | ICD-10-CM | POA: Diagnosis not present

## 2019-10-30 LAB — IFOBT (OCCULT BLOOD): IFOBT: POSITIVE

## 2019-11-11 NOTE — Progress Notes (Signed)
11/11/2019 Daniel Colon [id number] Sep 07, 1946   HISTORY OF PRESENT ILLNESS: [name] is a 7 male with a past medical history significant for CAD, MI,  RCA stent 1996, left LE DVT 2011, kidney stones, peptic ulcer disease, GERD, Barrett's esophagus and colon polyps. S/P Billroth II gastrojejunostomy in 2010. He presents today for further evaluation regarding a + FOBT collected 10/30/2019. Hg 13.8. HCT 42.1. He reports seeing bright red blood on the toilet tissue and on the stool after he passes a firm BM which occurs once every other month. He typically passes a normal brown BM without straining. No upper or lower abdominal pain. His most recent colonoscopy was 11/11/2015 which showed 2 hyperplastic polyps and diverticulosis. He has infrequent heartburn which occurs if he eats a certain brand of ice cream or milk. No dysphagia. His most recent EGD [facility] 11/16/2016 which confirmed Barrett's esophagus. He was advised by Dr. Hilarie Fredrickson to repeat an EGD in 3 years. He is on Omeprazole 13m po QD. He takes ASA 887mQD. He quit smoking in 2012. No alcohol use. No family history of colorectal cancer. Mother died at the age of 6078rom a MI. Brother with MI age 9581He denies having any chest pain, palpitations or shortness of breath. He last saw he cardiologist  2 months ago, he stated his exam and EKG were stable. He donates 1 unit of blood every 4 months.   Labs 10/23/2019: WBC 7.14. Hg 13.8. HCT 42.1. PLT 193. Na 138. K 4.5. BUN 18. Cr. 0.8. Alk phos 61. AST 16. ALT 25. T. Bili 0.8.  EGD 11/16/2016 by Dr. PyHilarie Fredrickson - Esophageal mucosal changes consistent with short-segment Barrett's esophagus.  -Biopsies verified stable Barrett's esophagus. - 3 cm hiatal hernia. - Normal gastric mucosa. - Patent Billroth II gastrojejunostomy was found, characterized by healthy appearing mucosa. -Repeat EGD in 3 years.  Colonoscopy 11/11/2015 by Dr. PyHilarie Fredrickson1. Two sessile hyperplastic polyps ranging between 3-37m53mn  size were found in the transverse colon and sigmoid colon; polypectomies were performed with a cold snare 2. Mild diverticulosis was noted in the descending colon -Recall colonoscopy 10/2025.  Colonoscopy 05/16/2010: Multiple hyperplastic polyps  Past Medical History:  Diagnosis Date  . Allergy   . Arthritis   . Barrett's esophagus   . CAD (coronary artery disease)    [place](11/13/13): abnormal with early ST changes to suggest ischemia;   c. LHC (11/17/13):  pLAD 30-40, D1 30, mLAD 70-75, mCFX 70, mRCA stent 40 (ISR), EF 55-65%.  Medical Rx  . Cataract    left eye removed, right immature  . Colon polyps   . Dyslipidemia   . GERD (gastroesophageal reflux disease)   . Hypertension   . [place]  . Nephrolithiasis   . PUD (peptic ulcer disease)    Past Surgical History:  Procedure Laterality Date  . ANGIOPLASTY     stent - rt cor. art  . BILROTH II PROCEDURE    . COLONOSCOPY    . LEFT HEART CATHETERIZATION WITH CORONARY ANGIOGRAM N/A 11/17/2013   Procedure: LEFT HEART CATHETERIZATION WITH CORONARY ANGIOGRAM;  Surgeon: MicBlane OharaD;  [facility];  Service: Cardiovascular;  Laterality: N/A;  . POLYPECTOMY    . ulcer surgery    . ULNAR NERVE TRANSPOSITION Right 08/05/2017   Procedure: RIGHT ULNAR NERVE DECOMPRESSION;  Surgeon: KuzLeanora CoverD;  [facility];  Laterality: Right;    reports that he quit smoking about 8 years ago. His smoking use included cigarettes. He has a 42.00 pack-year smoking history. His smokeless tobacco use includes chew. He reports current alcohol use. He reports that he does not use drugs. family history includes CAD (age of onset: 22) in his sister; CAD (age of onset: 13) in his brother and sister; Cancer in his brother; Liver disease in an other family member; Sudden death (age of onset: 37) in his mother. No Known Allergies     Outpatient Encounter Medications as of 11/13/2019  Medication Sig  . aspirin EC 81 MG tablet Take 81 mg by mouth every other day.  Marland Kitchen atorvastatin (LIPITOR) 40 MG tablet TAKE 1 TABLET DAILY  . carvedilol (COREG) 12.5 MG tablet Take 12.5 mg by mouth 2 (two) times daily with a meal.  . Coenzyme Q10 (CO Q-10) 100 MG CAPS Take 200 mg by mouth daily.  Marland Kitchen ezetimibe (ZETIA) 10 MG tablet Take 10 mg by mouth daily.  . fish oil-omega-3 fatty acids 1000 MG capsule Take 2 g by mouth daily.  Marland Kitchen ipratropium (ATROVENT) 0.03 % nasal spray   . Multiple Vitamins-Minerals (MULTIVITAMIN PO) Take 1 tablet by mouth daily.  . nitroGLYCERIN (NITROLINGUAL) 0.4 MG/SPRAY spray USE 1 SPRAY ON OR UNDER THE TONGUE EVERY 5 MINUTES FOR 3 DOSES AS NEEDED FOR CHEST PAIN AS DIRECTED BY PRESCRIBER OR PACKAGE INSTRUCTIONS  . omeprazole (PRILOSEC) 10 MG capsule Take 10 mg by mouth daily.  Marland Kitchen telmisartan-hydrochlorothiazide (MICARDIS HCT) 80-12.5 MG tablet Take 1 tablet by mouth daily.   No facility-administered encounter medications on file as of 11/13/2019.      REVIEW OF SYSTEMS  : All other systems reviewed and negative except where noted in the History of Present Illness.   PHYSICAL EXAM: Blood pressure 130/62, pulse 68, temperature 97.8 F (36.6 C), height 5' 8.5" (1.74 m), weight 206 lb (93.4 kg).  General: Well developed 73 year old in no acute distress Head: Normocephalic and atraumatic Eyes:  sclerae anicteric,conjunctive pink. Ears: Normal auditory acuity Neck: Supple, no masses.  Lungs: Clear throughout to auscultation Heart: Regular rate and rhythm Abdomen: Soft, nontender, non distended. No masses or hepatomegaly noted. Normal bowel sounds Rectal: Deferred. Musculoskeletal: Symmetrical with no gross deformities  Skin: No lesions on visible extremities Extremities: No edema  Neurological: Alert oriented x 4, grossly nonfocal Cervical Nodes:  No significant cervical adenopathy Psychological:  Alert and  cooperative. Normal mood and affect  ASSESSMENT AND PLAN:  71. 73 year old male with infrequent rectal bleeding with + FOBT, history of hyperplastic colon polyps -colonoscopy benefits and risks discussed including risks with sedation, risk of bleeding, perforation and infection  -Avoid straining, Benefiber and Miralax as needed   2. History of colon polyps -see plan #1  3. GERD and Barrett's esophagus 2017. Past Bilroth II secondary to PUD 2010.   Due for surveillance EGD -EGD at time of colonoscopy, benefits and risks discussed including risk with sedation, risk of bleeding perforation and infection -Continue Omeprazole 64m po QD   4. History of CAD, stent 1996 on ASA. Last saw cardiologist, Dr. HPercival Spanish10/11/2019. EKG without changes.   Further follow up to be determined after EGD and colonoscopy completed      CC:  PLeanna Battles MD

## 2019-11-13 ENCOUNTER — Ambulatory Visit (INDEPENDENT_AMBULATORY_CARE_PROVIDER_SITE_OTHER): Payer: Medicare Other | Admitting: Nurse Practitioner

## 2019-11-13 ENCOUNTER — Encounter: Payer: Self-pay | Admitting: Nurse Practitioner

## 2019-11-13 ENCOUNTER — Other Ambulatory Visit: Payer: Self-pay

## 2019-11-13 VITALS — BP 130/62 | HR 68 | Temp 97.8°F | Ht 68.5 in | Wt 206.0 lb

## 2019-11-13 DIAGNOSIS — K219 Gastro-esophageal reflux disease without esophagitis: Secondary | ICD-10-CM | POA: Diagnosis not present

## 2019-11-13 DIAGNOSIS — K625 Hemorrhage of anus and rectum: Secondary | ICD-10-CM | POA: Diagnosis not present

## 2019-11-13 DIAGNOSIS — K2270 Barrett's esophagus without dysplasia: Secondary | ICD-10-CM | POA: Diagnosis not present

## 2019-11-13 DIAGNOSIS — Z1159 Encounter for screening for other viral diseases: Secondary | ICD-10-CM

## 2019-11-13 DIAGNOSIS — Z86010 Personal history of colonic polyps: Secondary | ICD-10-CM

## 2019-11-13 MED ORDER — NA SULFATE-K SULFATE-MG SULF 17.5-3.13-1.6 GM/177ML PO SOLN: 1.0000 | Freq: Once | ORAL | 0 refills | Status: AC

## 2019-11-13 NOTE — Patient Instructions (Addendum)
If you are age 71 or older, your body mass index should be between 23-30. Your Body mass index is 30.87 kg/m. If this is out of the aforementioned range listed, please consider follow up with your Primary Care Provider.  If you are age 69 or younger, your body mass index should be between 19-25. Your Body mass index is 30.87 kg/m. If this is out of the aformentioned range listed, please consider follow up with your Primary Care Provider.   You have been scheduled for an endoscopy and colonoscopy. Please follow the written instructions given to you at your visit today. Please pick up your prep supplies at the pharmacy within the next 1-3 days. If you use inhalers (even only as needed), please bring them with you on the day of your procedure.  We have sent the following medications to your pharmacy for you to pick up at your convenience:  Suprep  .Use Benefiber 1 tablespoon daily to avoid straining. Call the office if your rectal bleeding continues,  Thank you for choosing me and [facility]

## 2019-11-30 ENCOUNTER — Ambulatory Visit (INDEPENDENT_AMBULATORY_CARE_PROVIDER_SITE_OTHER): Payer: Medicare Other

## 2019-11-30 ENCOUNTER — Other Ambulatory Visit: Payer: Self-pay | Admitting: Internal Medicine

## 2019-11-30 DIAGNOSIS — Z1159 Encounter for screening for other viral diseases: Secondary | ICD-10-CM

## 2019-11-30 LAB — SARS CORONAVIRUS 2 (TAT 6-24 HRS): SARS Coronavirus 2: NEGATIVE

## 2019-12-04 ENCOUNTER — Other Ambulatory Visit: Payer: Self-pay

## 2019-12-04 ENCOUNTER — Ambulatory Visit (AMBULATORY_SURGERY_CENTER): Payer: Medicare Other | Admitting: Internal Medicine

## 2019-12-04 ENCOUNTER — Encounter: Payer: Self-pay | Admitting: Internal Medicine

## 2019-12-04 VITALS — BP 122/67 | HR 58 | Temp 99.0°F | Resp 19 | Ht 68.5 in | Wt 206.0 lb

## 2019-12-04 DIAGNOSIS — R195 Other fecal abnormalities: Secondary | ICD-10-CM | POA: Diagnosis present

## 2019-12-04 DIAGNOSIS — Z86010 Personal history of colonic polyps: Secondary | ICD-10-CM

## 2019-12-04 DIAGNOSIS — K625 Hemorrhage of anus and rectum: Secondary | ICD-10-CM

## 2019-12-04 DIAGNOSIS — D122 Benign neoplasm of ascending colon: Secondary | ICD-10-CM | POA: Diagnosis not present

## 2019-12-04 DIAGNOSIS — K635 Polyp of colon: Secondary | ICD-10-CM

## 2019-12-04 DIAGNOSIS — K2270 Barrett's esophagus without dysplasia: Secondary | ICD-10-CM | POA: Diagnosis not present

## 2019-12-04 DIAGNOSIS — K449 Diaphragmatic hernia without obstruction or gangrene: Secondary | ICD-10-CM

## 2019-12-04 DIAGNOSIS — I10 Essential (primary) hypertension: Secondary | ICD-10-CM | POA: Diagnosis not present

## 2019-12-04 DIAGNOSIS — D123 Benign neoplasm of transverse colon: Secondary | ICD-10-CM | POA: Diagnosis not present

## 2019-12-04 DIAGNOSIS — K5730 Diverticulosis of large intestine without perforation or abscess without bleeding: Secondary | ICD-10-CM

## 2019-12-04 DIAGNOSIS — K219 Gastro-esophageal reflux disease without esophagitis: Secondary | ICD-10-CM | POA: Diagnosis not present

## 2019-12-04 DIAGNOSIS — K648 Other hemorrhoids: Secondary | ICD-10-CM

## 2019-12-04 DIAGNOSIS — I2510 Atherosclerotic heart disease of native coronary artery without angina pectoris: Secondary | ICD-10-CM | POA: Diagnosis not present

## 2019-12-04 MED ORDER — SODIUM CHLORIDE 0.9 % IV SOLN: 500.0000 mL | Freq: Once | INTRAVENOUS | Status: DC

## 2019-12-04 NOTE — Patient Instructions (Signed)
Please read handouts provided. Continue present medications. Await pathology results.      YOU HAD AN ENDOSCOPIC PROCEDURE TODAY AT THE [facility]:   Refer to the procedure report that was given to you for any specific questions about what was found during the examination.  If the procedure report does not answer your questions, please call your gastroenterologist to clarify.  If you requested that your care partner not be given the details of your procedure findings, then the procedure report has been included in a sealed envelope for you to review at your convenience later.  YOU SHOULD EXPECT: Some feelings of bloating in the abdomen. Passage of more gas than usual.  Walking can help get rid of the air that was put into your GI tract during the procedure and reduce the bloating. If you had a lower endoscopy (such as a colonoscopy or flexible sigmoidoscopy) you may notice spotting of blood in your stool or on the toilet paper. If you underwent a bowel prep for your procedure, you may not have a normal bowel movement for a few days.  Please Note:  You might notice some irritation and congestion in your nose or some drainage.  This is from the oxygen used during your procedure.  There is no need for concern and it should clear up in a day or so.  SYMPTOMS TO REPORT IMMEDIATELY:   Following lower endoscopy (colonoscopy or flexible sigmoidoscopy):  Excessive amounts of blood in the stool  Significant tenderness or worsening of abdominal pains  Swelling of the abdomen that is new, acute  Fever of 100F or higher   Following upper endoscopy (EGD)  Vomiting of blood or coffee ground material  New chest pain or pain under the shoulder blades  Painful or persistently difficult swallowing  New shortness of breath  Fever of 100F or higher  Black, tarry-looking stools  For urgent or emergent issues, a gastroenterologist can be reached at any hour by calling [phone].   DIET:  We do recommend a small meal at first, but then you may proceed to your regular diet.  Drink plenty of fluids but you should avoid alcoholic beverages for 24 hours.  ACTIVITY:  You should plan to take it easy for the rest of today and you should NOT DRIVE or use heavy machinery until tomorrow (because of the sedation medicines used during the test).    FOLLOW UP: Our staff will call the number listed on your records 48-72 hours following your procedure to check on you and address any questions or concerns that you may have regarding the information given to you following your procedure. If we do not reach you, we will leave a message.  We will attempt to reach you two times.  During this call, we will ask if you have developed any symptoms of COVID 19. If you develop any symptoms (ie: fever, flu-like symptoms, shortness of breath, cough etc.) before then, please call [phone].  If you test positive for Covid 19 in the 2 weeks post procedure, please call and report this information to us.    If any biopsies were taken you will be contacted by phone or by letter within the next 1-3 weeks.  Please call us at [phone] if you have not heard about the biopsies in 3 weeks.    SIGNATURES/CONFIDENTIALITY: You and/or your care partner have signed paperwork which will be entered into your electronic medical record.  These signatures attest to the fact   that that the information above on your After Visit Summary has been reviewed and is understood.  Full responsibility of the confidentiality of this discharge information lies with you and/or your care-partner. 

## 2019-12-04 NOTE — Progress Notes (Signed)
Called to room to assist during endoscopic procedure.  Patient ID and intended procedure confirmed with present staff. Received instructions for my participation in the procedure from the performing physician.  

## 2019-12-04 NOTE — Op Note (Signed)
[place] Patient Name: Daniel Colon Procedure Date: 12/04/2019 1:57 PM MRN: [id number] Endoscopist: Jerene Bears , MD Age: 73 Referring MD:  Date of Birth: 07-Jan-1946 Gender: Male Account #: [id number] Procedure:                Colonoscopy Indications:              Heme positive stool, last colonoscopy Nov 2016                            (hyperplastic polyps) Medicines:                Monitored Anesthesia Care Procedure:                Pre-Anesthesia Assessment:                           - Prior to the procedure, a History and Physical                            was performed, and patient medications and                            allergies were reviewed. The patient's tolerance of                            previous anesthesia was also reviewed. The risks                            and benefits of the procedure and the sedation                            options and risks were discussed with the patient.                            All questions were answered, and informed consent                            was obtained. Prior Anticoagulants: The patient has                            taken no previous anticoagulant or antiplatelet                            agents. ASA Grade Assessment: II - A patient with                            mild systemic disease. After reviewing the risks                            and benefits, the patient was deemed in                            satisfactory condition to undergo the procedure.  After obtaining informed consent, the colonoscope                            was passed under direct vision. Throughout the                            procedure, the patient's blood pressure, pulse, and                            oxygen saturations were monitored continuously. The                            Colonoscope was introduced through the anus and                            advanced to the cecum, identified by appendiceal                             orifice and ileocecal valve. The colonoscopy was                            performed without difficulty. The patient tolerated                            the procedure well. The quality of the bowel                            preparation was good. The ileocecal valve,                            appendiceal orifice, and rectum were photographed. Scope In: 2:17:16 PM Scope Out: 2:33:41 PM Scope Withdrawal Time: 0 hours 13 minutes 1 second  Total Procedure Duration: 0 hours 16 minutes 25 seconds  Findings:                 The digital rectal exam was normal.                           Two sessile polyps were found in the ascending                            colon. The polyps were 3 to 5 mm in size. These                            polyps were removed with a cold snare. Resection                            and retrieval were complete.                           A 3 mm polyp was found in the hepatic flexure. The                            polyp was sessile.  The polyp was removed with a                            cold snare. Resection and retrieval were complete.                           Multiple small-mouthed diverticula were found in                            the sigmoid colon and descending colon.                           Internal hemorrhoids were found during                            retroflexion. The hemorrhoids were small. Complications:            No immediate complications. Estimated Blood Loss:     Estimated blood loss was minimal. Impression:               - Two 3 to 5 mm polyps in the ascending colon,                            removed with a cold snare. Resected and retrieved.                           - One 3 mm polyp at the hepatic flexure, removed                            with a cold snare. Resected and retrieved.                           - Diverticulosis in the sigmoid colon and in the                            descending colon.                            - Small internal hemorrhoids. Recommendation:           - Patient has a contact number available for                            emergencies. The signs and symptoms of potential                            delayed complications were discussed with the                            patient. Return to normal activities tomorrow.                            Written discharge instructions were provided to the  patient.                           - Resume previous diet.                           - Continue present medications.                           - Await pathology results.                           - Repeat colonoscopy is recommended. The                            colonoscopy date will be determined after pathology                            results from today's exam become available for                            review. Jerene Bears, MD 12/04/2019 2:42:57 PM This report has been signed electronically.

## 2019-12-04 NOTE — Op Note (Signed)
[place] Patient Name: Daniel Colon Procedure Date: 12/04/2019 1:58 PM MRN: [id number] Endoscopist: Jerene Bears , MD Age: 73 Referring MD:  Date of Birth: 05-11-1946 Gender: Male Account #: [id number] Procedure:                Upper GI endoscopy Indications:              Surveillance for malignancy due to personal history                            of Barrett's esophagus Medicines:                Monitored Anesthesia Care Procedure:                Pre-Anesthesia Assessment:                           - Prior to the procedure, a History and Physical                            was performed, and patient medications and                            allergies were reviewed. The patient's tolerance of                            previous anesthesia was also reviewed. The risks                            and benefits of the procedure and the sedation                            options and risks were discussed with the patient.                            All questions were answered, and informed consent                            was obtained. Prior Anticoagulants: The patient has                            taken no previous anticoagulant or antiplatelet                            agents. ASA Grade Assessment: II - A patient with                            mild systemic disease. After reviewing the risks                            and benefits, the patient was deemed in                            satisfactory condition to undergo the procedure.  After obtaining informed consent, the endoscope was                            passed under direct vision. Throughout the                            procedure, the patient's blood pressure, pulse, and                            oxygen saturations were monitored continuously. The                            Endoscope was introduced through the mouth, and                            advanced to the second part of  duodenum. The upper                            GI endoscopy was accomplished without difficulty.                            The patient tolerated the procedure well. Scope In: Scope Out: Findings:                 The esophagus and gastroesophageal junction were                            examined with white light and narrow band imaging                            (NBI) from a forward view and retroflexed position.                            There were esophageal mucosal changes consistent                            with short-segment Barrett's esophagus. These                            changes involved the mucosa at the upper extent of                            the gastric folds (41 cm from the incisors)                            extending to the Z-line (38 cm from the incisors).                            Circumferential salmon-colored mucosa was present                            from 39 to 41 cm, one tongue of salmon-colored  mucosa was present from 38 to 39 cm and scattered                            islands of salmon-colored mucosa were present at 38                            cm. The maximum longitudinal extent of these                            esophageal mucosal changes was 3 cm in length.                            Mucosa was biopsied with a cold forceps for                            histology in a targeted manner at intervals of 1.5                            cm from 38 to 41 cm from the incisors. A total of 2                            specimen bottles were sent to pathology.                           A 1 cm hiatal hernia was present.                           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 The gastrojejunal anastomosis was                            characterized by healthy appearing mucosa. This was                            traversed. The afferent and efferent limb were                             examined for a short distance and unremarkable.                           The examined duodenum was normal. Complications:            No immediate complications. Estimated Blood Loss:     Estimated blood loss was minimal. Impression:               - Esophageal mucosal changes consistent with                            short-segment Barrett's esophagus. Biopsied.                           - 1 cm hiatal  hernia.                           - Patent Billroth II gastrojejunostomy was found,                            characterized by healthy appearing mucosa.                           - Normal examined duodenum. Recommendation:           - Patient has a contact number available for                            emergencies. The signs and symptoms of potential                            delayed complications were discussed with the                            patient. Return to normal activities tomorrow.                            Written discharge instructions were provided to the                            patient.                           - Resume previous diet.                           - Continue present medications.                           - Await pathology results.                           - Repeat upper endoscopy for surveillance based on                            pathology results. Jerene Bears, MD 12/04/2019 2:40:04 PM This report has been signed electronically.

## 2019-12-04 NOTE — Progress Notes (Signed)
[facility] Temp-LC

## 2019-12-04 NOTE — Progress Notes (Signed)
Report to PACU, RN, vss, BBS= Clear.  

## 2019-12-06 ENCOUNTER — Telehealth: Payer: Self-pay | Admitting: *Deleted

## 2019-12-06 NOTE — Telephone Encounter (Signed)
  Follow up Call-  Call back number 12/04/2019  Post procedure Call Back phone  # [phone]  Permission to leave phone message Yes  Some recent data might be hidden     Patient questions:  Do you have a fever, pain , or abdominal swelling? No. Pain Score  0 *  Have you tolerated food without any problems? Yes.    Have you been able to return to your normal activities? Yes.    Do you have any questions about your discharge instructions: Diet   No. Medications  No. Follow up visit  No.  Do you have questions or concerns about your Care? No.  Actions: * If pain score is 4 or above: 1. No action needed, pain <4.Have you developed a fever since your procedure? no  2.   Have you had an respiratory symptoms (SOB or cough) since your procedure? no  3.   Have you tested positive for COVID 19 since your procedure no  4.   Have you had any family members/close contacts diagnosed with the COVID 19 since your procedure?  no   If yes to any of these questions please route to Joylene John, RN and Alphonsa Gin, Therapist, sports.

## 2019-12-07 ENCOUNTER — Encounter: Payer: Self-pay | Admitting: Internal Medicine

## 2019-12-27 ENCOUNTER — Ambulatory Visit
Admission: RE | Admit: 2019-12-27 | Discharge: 2019-12-27 | Disposition: A | Discharge status: Home / Self Care | Payer: Medicare Other | Source: Ambulatory Visit | Attending: Acute Care | Admitting: Acute Care

## 2019-12-27 ENCOUNTER — Telehealth: Payer: Self-pay | Admitting: Acute Care

## 2019-12-27 DIAGNOSIS — Z122 Encounter for screening for malignant neoplasm of respiratory organs: Secondary | ICD-10-CM

## 2019-12-27 DIAGNOSIS — Z87891 Personal history of nicotine dependence: Secondary | ICD-10-CM

## 2019-12-27 NOTE — Telephone Encounter (Signed)
Call report received from Opal Sidles with [facility] on Lung Screening CT performed today.  Please review the impression copied below:  IMPRESSION: 1. Lung-RADS Category 1, negative. Continue annual screening with low-dose chest CT without contrast in 12 months. 2. Aortic atherosclerosis (ICD10-170.0). Three-vessel coronary artery calcification. 3. Right adrenal adenoma, better visualized on the current study.   Routing to Judson Roch NP to make her aware.

## 2019-12-28 ENCOUNTER — Telehealth: Payer: Self-pay | Admitting: Acute Care

## 2019-12-28 NOTE — Telephone Encounter (Signed)
I called [name] to give him the results of his low-dose CT scan. Lung RADS 4 B indicates suspicious findings for which additional diagnostic testing and or tissue sampling is recommended.   There was no answer.  I left a message requesting that he call the office so that we can give him the results of his scan.  I provided the office contact information in the message I left.  We will await his return call.  If I do not hear from him by tomorrow, I will reach out again.  Langley Gauss he is going to need a PET scan and PFTs.  I do not want to put them in the computer as an order until I have spoken to him as I do not want someone to call and schedule these things before I have explained to him the reason we need them.  Thanks so much

## 2020-01-01 NOTE — Telephone Encounter (Signed)
See telephone note 12/28/19.

## 2020-01-01 NOTE — Telephone Encounter (Signed)
Spoke with pt and scheduled ov with Maggie Schwalbe 01/03/20 2:30. Nothing further needed at this time.

## 2020-01-01 NOTE — Telephone Encounter (Signed)
LMTC x 1  

## 2020-01-03 ENCOUNTER — Ambulatory Visit (INDEPENDENT_AMBULATORY_CARE_PROVIDER_SITE_OTHER): Payer: Medicare Other | Admitting: Acute Care

## 2020-01-03 ENCOUNTER — Encounter: Payer: Self-pay | Admitting: Acute Care

## 2020-01-03 ENCOUNTER — Other Ambulatory Visit: Payer: Self-pay

## 2020-01-03 DIAGNOSIS — R9389 Abnormal findings on diagnostic imaging of other specified body structures: Secondary | ICD-10-CM

## 2020-01-03 DIAGNOSIS — R911 Solitary pulmonary nodule: Secondary | ICD-10-CM

## 2020-01-03 NOTE — Patient Instructions (Signed)
It is good to see you today. We will get you scheduled for a PET scan and a PFT. You will get a call to schedule both tests. Please call patient's wife at [phone] >> Daniel Colon to schedule Follow up in 2 weeks with Judson Roch NP Please contact office for sooner follow up if symptoms do not improve or worsen or seek emergency care

## 2020-01-04 ENCOUNTER — Other Ambulatory Visit: Payer: Self-pay | Admitting: *Deleted

## 2020-01-04 NOTE — Progress Notes (Signed)
History of Present Illness Daniel Colon is a 74 y.o. male former smoker ( Quit 2012 with a 42 pack year history) followed by the screening program.   01/05/2020 Follow up of Lung RADS 4 B LDCT Scan Pt presents for follow up of an abnormal low dose CT. Scan done 12/27/2019. The CT Scan was read as a Lung RADS 4 B  and was suspicious for early stage bronchogenic cancer.This is a new finding since 2020 scan.  I have discussed these results with Daniel Colon and his wife.  I pulled up the CT scan on the computer so that he could visualize the nodule we were discussing.  I explained that our next step should be to obtain a PET scan to evaluate whether or not this area is hypermetabolic. He understands there is a chance this is a cancer. Additionally, the patient has never had pulmonary function tests, so we will order these.  I was able to present this patient at thoracic conference 01/04/2020.  Dr. Roxan Hockey was able to view the CT scan, and the overall consensus was that this nodule needed to be removed.  I will refer the patient to thoracic surgery pending  PET scan results, and PFT's.He and his wife walk several miles daily, he appears to be in good shape.   Test Results: Low Dose CT Chest for Lung Cancer Screening 12/27/2019 New 15.7 mm nodule in the posterior segment right upper lobe. Lung-RADS 4B, suspicious. Additional imaging evaluation or consultation with Pulmonology or Thoracic Surgery recommended. Right adrenal adenoma. Aortic atherosclerosis (ICD10-I70.0). Coronary artery calcification. Emphysema (ICD10-J43.9).  PET Scan 5/95/6387 Hypermetabolic posterior right upper lobe lung nodule, consistent with primary bronchogenic carcinoma. No thoracic nodal or extrathoracic hypermetabolic metastasis. Presuming non-small-cell histology, T1bN0M0 or stage IA. Aortic atherosclerosis (ICD10-I70.0), coronary artery atherosclerosis and emphysema (ICD10-J43.9). Right adrenal adenoma.  CBC Latest  Ref Rng & Units 11/13/2013  WBC 4.5 - 10.5 K/uL 7.0  Hemoglobin 13.0 - 17.0 g/dL 13.3  Hematocrit 39.0 - 52.0 % 39.8  Platelets 150.0 - 400.0 K/uL 197.0    BMP Latest Ref Rng & Units 08/02/2017 11/13/2013  Glucose 65 - 99 mg/dL 105(H) 92  BUN 6 - 20 mg/dL 19 18  Creatinine 0.61 - 1.24 mg/dL 0.97 0.9  Sodium 135 - 145 mmol/L 138 138  Potassium 3.5 - 5.1 mmol/L 5.1 4.0  Chloride 101 - 111 mmol/L 106 105  CO2 22 - 32 mmol/L 22 24  Calcium 8.9 - 10.3 mg/dL 9.1 9.2    BNP No results found for: BNP  ProBNP No results found for: PROBNP  PFT No results found for: FEV1PRE, FEV1POST, FVCPRE, FVCPOST, TLC, DLCOUNC, PREFEV1FVCRT, PSTFEV1FVCRT  NM PET Image Initial (PI) Skull Base To Thigh  Result Date: 01/05/2020 CLINICAL DATA:  Initial treatment strategy for new 1.6 cm nodule in the posterior right upper lobe on screening CT. EXAM: NUCLEAR MEDICINE PET SKULL BASE TO THIGH TECHNIQUE: 11.4 mCi F-18 FDG was injected intravenously. Full-ring PET imaging was performed from the skull base to thigh after the radiotracer. CT data was obtained and used for attenuation correction and anatomic localization. Fasting blood glucose: 92 mg/dl COMPARISON:  Screening CT 12/27/2019. FINDINGS: Mediastinal blood pool activity: SUV max 2.2 Liver activity: SUV max NA NECK: No areas of abnormal hypermetabolism. Incidental CT findings: No cervical adenopathy. Left maxillary sinus mucosal thickening. CHEST: No cervical nodal hypermetabolism. Hypermetabolism corresponding to the perifissural right upper lobe pulmonary nodule. 1.4 cm and a S.U.V. max of 9.2 on 105/3. Incidental CT  findings: Deferred to recent screening CT. Aortic and coronary artery atherosclerosis. Centrilobular emphysema. ABDOMEN/PELVIS: No abdominopelvic parenchymal or nodal hypermetabolism. Incidental CT findings: Left adrenal thickening. Right adrenal 1.6 cm adenoma. High left hepatic lobe subcentimeter cyst. Surgical changes of gastrojejunostomy. Mild  prostatomegaly. Small fat containing right inguinal hernia. SKELETON: No abnormal marrow activity. Incidental CT findings: Degenerative changes of both hips. IMPRESSION: 1. Hypermetabolic posterior right upper lobe lung nodule, consistent with primary bronchogenic carcinoma. 2. No thoracic nodal or extrathoracic hypermetabolic metastasis. 3. Presuming non-small-cell histology, T1bN0M0 or stage IA. 4. Aortic atherosclerosis (ICD10-I70.0), coronary artery atherosclerosis and emphysema (ICD10-J43.9). 5. Right adrenal adenoma. Electronically Signed   By: Abigail Miyamoto M.D.   On: 01/05/2020 16:24   CT CHEST LUNG CA SCREEN LOW DOSE W/O CM  Result Date: 12/27/2019 CLINICAL DATA:  Former smoker, quit 8 years ago, 40 pack-year history. EXAM: CT CHEST WITHOUT CONTRAST LOW-DOSE FOR LUNG CANCER SCREENING TECHNIQUE: Multidetector CT imaging of the chest was performed following the standard protocol without IV contrast. COMPARISON:  12/19/2018. FINDINGS: Cardiovascular: Atherosclerotic calcification of the aorta and coronary arteries. Heart size normal. No pericardial effusion. Mediastinum/Nodes: No pathologically enlarged mediastinal or axillary lymph nodes. Hilar regions are difficult to definitively evaluate without IV contrast but appear grossly unremarkable. Esophagus is grossly unremarkable. Lungs/Pleura: Mild centrilobular emphysema. Calcified granulomas. There is a new 15.7 mm nodule in the peripheral aspect of the posterior segment right upper lobe (9/120). Other noncalcified pulmonary nodules measure 3.9 mm or less in size. No pleural fluid. Airway is unremarkable. Upper Abdomen: Subcentimeter low-attenuation lesion in the dome of liver is too small to characterize. Fluid density nodule in the right adrenal gland measures 2.1 cm. Visualized portions of the left adrenal gland, kidneys, spleen and pancreas are otherwise unremarkable. Postoperative changes are seen in the distal stomach. No upper abdominal adenopathy.  Musculoskeletal: Degenerative changes in the spine. No worrisome lytic or sclerotic lesions. IMPRESSION: 1. New 15.7 mm nodule in the posterior segment right upper lobe. Lung-RADS 4B, suspicious. Additional imaging evaluation or consultation with Pulmonology or Thoracic Surgery recommended. These results will be called to the ordering clinician or representative by the Radiologist Assistant, and communication documented in the PACS or zVision Dashboard. 2. Right adrenal adenoma. 3. Aortic atherosclerosis (ICD10-I70.0). Coronary artery calcification. 4.  Emphysema (ICD10-J43.9). Electronically Signed   By: Lorin Picket M.D.   On: 12/27/2019 13:10     Past medical hx Past Medical History:  Diagnosis Date  . Allergy   . Arthritis   . Barrett's esophagus   . CAD (coronary artery disease)    a. Stent to the [place] (in [place]);  b. ETT(11/13/13): abnormal with early ST changes to suggest ischemia;   c. LHC (11/17/13):  pLAD 30-40, D1 30, mLAD 70-75, mCFX 70, mRCA stent 40 (ISR), EF 55-65%.  Medical Rx  . Cataract    left eye removed, right immature  . Colon polyps   . Dyslipidemia   . GERD (gastroesophageal reflux disease)   . Hypertension   . Myocardial infarction ([place]) 1996  . Nephrolithiasis    kidney stone  . PUD (peptic ulcer disease)      Social History   Tobacco Use  . Smoking status: Former Smoker    Packs/day: 1.00    Years: 42.00    Pack years: 42.00    Types: Cigarettes    Quit date: 06/21/2011    Years since quitting: 8.5  . Smokeless tobacco: Current User    Types: Chew  . Tobacco  comment: Quit 2.5 years ago.   Substance Use Topics  . Alcohol use: Yes    Alcohol/week: 0.0 standard drinks    Comment: social  . Drug use: No    DanielTarte reports that he quit smoking about 8 years ago. His smoking use included cigarettes. He has a 42.00 pack-year smoking history. His smokeless tobacco use includes chew. He reports current alcohol use. He reports that he does not use  drugs.  Tobacco Cessation: Former smoker , Quit 2012 with a 42 pack year smoking history Past surgical hx, Family hx, Social hx all reviewed.  Current Outpatient Medications on File Prior to Visit  Medication Sig  . aspirin EC 81 MG tablet Take 81 mg by mouth every other day.  Marland Kitchen atorvastatin (LIPITOR) 40 MG tablet TAKE 1 TABLET DAILY  . carvedilol (COREG) 12.5 MG tablet Take 12.5 mg by mouth 2 (two) times daily with a meal.  . Coenzyme Q10 (CO Q-10) 100 MG CAPS Take 200 mg by mouth daily.  Marland Kitchen ezetimibe (ZETIA) 10 MG tablet Take 10 mg by mouth daily.  . fish oil-omega-3 fatty acids 1000 MG capsule Take 2 g by mouth daily.  Marland Kitchen ipratropium (ATROVENT) 0.03 % nasal spray   . Multiple Vitamins-Minerals (MULTIVITAMIN PO) Take 1 tablet by mouth daily.  . nitroGLYCERIN (NITROLINGUAL) 0.4 MG/SPRAY spray USE 1 SPRAY ON OR UNDER THE TONGUE EVERY 5 MINUTES FOR 3 DOSES AS NEEDED FOR CHEST PAIN AS DIRECTED BY PRESCRIBER OR PACKAGE INSTRUCTIONS  . omeprazole (PRILOSEC) 10 MG capsule Take 10 mg by mouth daily.  Marland Kitchen oxybutynin (DITROPAN) 5 MG tablet Take 5 mg by mouth 3 (three) times daily.  Marland Kitchen telmisartan-hydrochlorothiazide (MICARDIS HCT) 80-12.5 MG tablet Take 1 tablet by mouth daily.   No current facility-administered medications on file prior to visit.     No Known Allergies  Review Of Systems:  Constitutional:   No  weight loss, night sweats,  Fevers, chills, fatigue, or  lassitude.  HEENT:   No headaches,  Difficulty swallowing,  Tooth/dental problems, or  Sore throat,                No sneezing, itching, ear ache, nasal congestion, post nasal drip,   CV:  No chest pain,  Orthopnea, PND, swelling in lower extremities, anasarca, dizziness, palpitations, syncope.   GI  No heartburn, indigestion, abdominal pain, nausea, vomiting, diarrhea, change in bowel habits, loss of appetite, bloody stools.   Resp: No shortness of breath with exertion or at rest.  No excess mucus, no productive cough,  No  non-productive cough,  No coughing up of blood.  No change in color of mucus.  No wheezing.  No chest wall deformity  Skin: no rash or lesions.  GU: no dysuria, change in color of urine, no urgency or frequency.  No flank pain, no hematuria   MS:  No joint pain or swelling.  No decreased range of motion.  No back pain.  Psych:  No change in mood or affect. No depression or anxiety.  No memory loss.   Vital Signs BP 120/60 (BP Location: Right Arm, Cuff Size: Normal)   Pulse 60   Temp 98.9 F (37.2 C) (Oral)   Ht 5' 8.5" (1.74 m)   Wt 206 lb 9.6 oz (93.7 kg)   SpO2 97%   BMI 30.96 kg/m    Physical Exam:  General- No distress,  A&Ox3, pleasant ENT: No sinus tenderness, TM clear, pale nasal mucosa, no oral exudate,no post nasal drip,  no LAN Cardiac: S1, S2, regular rate and rhythm, no murmur Chest: No wheeze/ rales/ dullness; no accessory muscle use, no nasal flaring, no sternal retractions Abd.: Soft Non-tender, ND, BS + Ext: No clubbing cyanosis, edema Neuro:  normal strength, cranial nerves intact Skin: No rashes, warm and dry, no lesions Psych: normal mood and behavior   Assessment/Plan  Abnormal screening CT of chest Asymptomatic Plan: PET scan PFT's Review with Thoracic conference Referral to [facility] Dr. Roxan Hockey  Follow up in 2 months with Judson Roch NP  40 minutes of patient care provided this visit  Magdalen Spatz, NP 01/05/2020  5:46 PM

## 2020-01-04 NOTE — Progress Notes (Signed)
The proposed treatment discussed in cancer conference 01/04/20 is for discussion purpose only and is not a binding recommendation.  The patient was not physically examined nor present for their treatment options.  Therefore, final treatment plan cannot be decided.

## 2020-01-05 ENCOUNTER — Other Ambulatory Visit: Payer: Self-pay

## 2020-01-05 ENCOUNTER — Encounter
Admission: RE | Admit: 2020-01-05 | Discharge: 2020-01-05 | Disposition: A | Discharge status: Home / Self Care | Payer: Medicare Other | Source: Ambulatory Visit | Attending: Acute Care | Admitting: Acute Care

## 2020-01-05 ENCOUNTER — Encounter: Payer: Self-pay | Admitting: Acute Care

## 2020-01-05 ENCOUNTER — Other Ambulatory Visit: Payer: Self-pay | Admitting: Acute Care

## 2020-01-05 DIAGNOSIS — R911 Solitary pulmonary nodule: Secondary | ICD-10-CM | POA: Insufficient documentation

## 2020-01-05 DIAGNOSIS — I2510 Atherosclerotic heart disease of native coronary artery without angina pectoris: Secondary | ICD-10-CM | POA: Diagnosis not present

## 2020-01-05 DIAGNOSIS — J439 Emphysema, unspecified: Secondary | ICD-10-CM | POA: Diagnosis not present

## 2020-01-05 DIAGNOSIS — I700 Atherosclerosis of aorta: Secondary | ICD-10-CM | POA: Diagnosis not present

## 2020-01-05 DIAGNOSIS — R9389 Abnormal findings on diagnostic imaging of other specified body structures: Secondary | ICD-10-CM

## 2020-01-05 DIAGNOSIS — D3501 Benign neoplasm of right adrenal gland: Secondary | ICD-10-CM | POA: Insufficient documentation

## 2020-01-05 LAB — GLUCOSE, CAPILLARY: Glucose-Capillary: 92 mg/dL (ref 70–99)

## 2020-01-05 MED ORDER — FLUDEOXYGLUCOSE F - 18 (FDG) INJECTION
11.3700 | Freq: Once | INTRAVENOUS | Status: AC | PRN
  Administered 2020-01-05: 11.37 via INTRAVENOUS

## 2020-01-05 NOTE — Assessment & Plan Note (Addendum)
Asymptomatic Plan: PET scan PFT's Review with Thoracic conference Referral to [facility] Dr. Roxan Hockey

## 2020-01-06 ENCOUNTER — Other Ambulatory Visit (HOSPITAL_COMMUNITY)
Admission: RE | Admit: 2020-01-06 | Discharge: 2020-01-06 | Disposition: A | Discharge status: Home / Self Care | Payer: Medicare Other | Source: Ambulatory Visit | Attending: Acute Care | Admitting: Acute Care

## 2020-01-06 DIAGNOSIS — Z20822 Contact with and (suspected) exposure to covid-19: Secondary | ICD-10-CM | POA: Diagnosis not present

## 2020-01-06 DIAGNOSIS — Z01812 Encounter for preprocedural laboratory examination: Secondary | ICD-10-CM | POA: Insufficient documentation

## 2020-01-06 LAB — SARS CORONAVIRUS 2 (TAT 6-24 HRS): SARS Coronavirus 2: NEGATIVE

## 2020-01-09 ENCOUNTER — Telehealth: Payer: Self-pay | Admitting: *Deleted

## 2020-01-09 ENCOUNTER — Encounter: Payer: Self-pay | Admitting: Cardiothoracic Surgery

## 2020-01-09 ENCOUNTER — Institutional Professional Consult (permissible substitution) (INDEPENDENT_AMBULATORY_CARE_PROVIDER_SITE_OTHER): Payer: Medicare Other | Admitting: Cardiothoracic Surgery

## 2020-01-09 ENCOUNTER — Telehealth: Payer: Self-pay | Admitting: Acute Care

## 2020-01-09 ENCOUNTER — Ambulatory Visit (INDEPENDENT_AMBULATORY_CARE_PROVIDER_SITE_OTHER): Payer: Medicare Other | Admitting: Pulmonary Disease

## 2020-01-09 ENCOUNTER — Other Ambulatory Visit: Payer: Self-pay | Admitting: *Deleted

## 2020-01-09 ENCOUNTER — Other Ambulatory Visit: Payer: Self-pay

## 2020-01-09 VITALS — BP 122/63 | HR 62 | Temp 98.1°F | Resp 16 | Ht 68.5 in | Wt 200.0 lb

## 2020-01-09 DIAGNOSIS — R911 Solitary pulmonary nodule: Secondary | ICD-10-CM

## 2020-01-09 DIAGNOSIS — D381 Neoplasm of uncertain behavior of trachea, bronchus and lung: Secondary | ICD-10-CM

## 2020-01-09 LAB — PULMONARY FUNCTION TEST
DL/VA % pred: 79 %
DL/VA: 3.2 ml/min/mmHg/L
DLCO unc % pred: 72 %
DLCO unc: 18.01 ml/min/mmHg
FEF 25-75 Post: 2.26 L/sec
FEF 25-75 Pre: 1.45 L/sec
FEF2575-%Change-Post: 56 %
FEF2575-%Pred-Post: 97 %
FEF2575-%Pred-Pre: 62 %
FEV1-%Change-Post: 10 %
FEV1-%Pred-Post: 92 %
FEV1-%Pred-Pre: 83 %
FEV1-Post: 2.86 L
FEV1-Pre: 2.58 L
FEV1FVC-%Change-Post: 2 %
FEV1FVC-%Pred-Pre: 92 %
FEV6-%Change-Post: 8 %
FEV6-%Pred-Post: 101 %
FEV6-%Pred-Pre: 92 %
FEV6-Post: 4.02 L
FEV6-Pre: 3.69 L
FEV6FVC-%Change-Post: 0 %
FEV6FVC-%Pred-Post: 104 %
FEV6FVC-%Pred-Pre: 103 %
FVC-%Change-Post: 8 %
FVC-%Pred-Post: 97 %
FVC-%Pred-Pre: 89 %
FVC-Post: 4.11 L
FVC-Pre: 3.78 L
Post FEV1/FVC ratio: 70 %
Post FEV6/FVC ratio: 98 %
Pre FEV1/FVC ratio: 68 %
Pre FEV6/FVC Ratio: 98 %
RV % pred: 96 %
RV: 2.33 L
TLC % pred: 93 %
TLC: 6.37 L

## 2020-01-09 NOTE — Progress Notes (Signed)
Full PFT performed today. °

## 2020-01-09 NOTE — Progress Notes (Signed)
Daniel Colon       [place] [address]             [phone]                    Daniel Colon  Medical Record #[id number] Date of Birth: 05-10-1946  Referring: Magdalen Spatz, NP Primary Care: Leanna Battles, MD Primary Cardiologist: No primary care provider on file.  Chief Complaint:    Chief Complaint  Patient presents with  . Lung Lesion    RULobe.Marland KitchenMarland KitchenLung cancer screening CT 12/27/19, PET 01/05/20, PFT 01/09/20    History of Present Illness:    Daniel Colon 74 y.o. male is seen in the office  today for evaluation of a new right upper lobe lung lesion.  Patient has a previous history of smoking for approximately 40 years both he and his wife quit smoking 9 years ago.  For the past 4 years he has been getting yearly CT lung cancer screening CTs, scan 1 year ago suggested a very small subpleural nodule in the right upper lobe, scan this year showed this is increased in size up to 1.4  cm.  Area is hypermetabolic on PET scan, there were NO associated enlarged mediastinal nodes were PET positive nodes.  The patient's previous history is also significant for known short segment Barrett's esophagus most recently evaluated with upper GI endoscopy in December 2020-biopsy showed no dysplasia.  He had a Billroth II done while living in [place]  Patient has known coronary occlusive disease, with stents placed in the right coronary artery in 1996 while living [place].  His recent CT scan showed very extensive coronary calcifications in the both right and left system.  He had cardiac catheterization done in 2014.  He has no definite angina, but does have subtle symptoms of some wheezing occasionally with exertion, he does note shortness of breath when walking up hills but continues to walk about 3 miles every other day in his neighborhood.   Current Activity/ Functional Status:  Patient is independent with mobility/ambulation, transfers, ADL's, IADL's.   Zubrod  Score: At the time of surgery this patient's most appropriate activity status/level should be described as: [x]     0    Normal activity, no symptoms []     1    Restricted in physical strenuous activity but ambulatory, able to do out light work []     2    Ambulatory and capable of self care, unable to do work activities, up and about               >50 % of waking hours                              []     3    Only limited self care, in bed greater than 50% of waking hours []     4    Completely disabled, no self care, confined to bed or chair []     5    Moribund   Past Medical History:  Diagnosis Date  . Allergy   . Arthritis   . Barrett's esophagus   . CAD (coronary artery disease)    a. Stent to the [facility] (in [place]);  b. ETT(11/13/13): abnormal with early ST changes to suggest ischemia;   c. LHC (11/17/13):  pLAD 30-40, D1 30, mLAD 70-75, mCFX 70, mRCA stent 40 (ISR), EF  55-65%.  Medical Rx  . Cataract    left eye removed, right immature  . Colon polyps   . Dyslipidemia   . GERD (gastroesophageal reflux disease)   . Hypertension   . Myocardial infarction ([place]) 1996  . Nephrolithiasis    kidney stone  . PUD (peptic ulcer disease)     Past Surgical History:  Procedure Laterality Date  . ANGIOPLASTY     stent - rt cor. art  . BILROTH II PROCEDURE    . COLONOSCOPY    . LEFT HEART CATHETERIZATION WITH CORONARY ANGIOGRAM N/A 11/17/2013   Procedure: LEFT HEART CATHETERIZATION WITH CORONARY ANGIOGRAM;  Surgeon: Blane Ohara, MD;  Location: [facility] CATH LAB;  Service: Cardiovascular;  Laterality: N/A;  . POLYPECTOMY    . ulcer surgery    . ULNAR NERVE TRANSPOSITION Right 08/05/2017   Procedure: RIGHT ULNAR NERVE DECOMPRESSION;  Surgeon: Leanora Cover, MD;  Location: [facility];  Service: Orthopedics;  Laterality: Right;    Family History  Problem Relation Age of Onset  . Sudden death Mother 53  . CAD Brother 25       CABG  . CAD Sister 21       CABG  . CAD  Sister 88       CABG  . Cancer Brother   . Liver disease Other   . Colon cancer Neg Hx   . Colon polyps Neg Hx   . Esophageal cancer Neg Hx   . Rectal cancer Neg Hx   . Stomach cancer Neg Hx      Social History   Tobacco Use  Smoking Status Former Smoker  . Packs/day: 1.00  . Years: 42.00  . Pack years: 42.00  . Types: Cigarettes  . Quit date: 06/21/2011  . Years since quitting: 8.5  Smokeless Tobacco Current User  . Types: Chew  Tobacco Comment   Quit 2.5 years ago.     Social History   Substance and Sexual Activity  Alcohol Use Yes  . Alcohol/week: 0.0 standard drinks   Comment: social     No Known Allergies  Current Outpatient Medications  Medication Sig Dispense Refill  . aspirin EC 81 MG tablet Take 81 mg by mouth every other day.    Marland Kitchen atorvastatin (LIPITOR) 40 MG tablet TAKE 1 TABLET DAILY 90 tablet 3  . carvedilol (COREG) 12.5 MG tablet Take 12.5 mg by mouth 2 (two) times daily with a meal.    . Coenzyme Q10 (CO Q-10) 100 MG CAPS Take 200 mg by mouth daily.    Marland Kitchen ezetimibe (ZETIA) 10 MG tablet Take 10 mg by mouth daily.    . fish oil-omega-3 fatty acids 1000 MG capsule Take 2 g by mouth daily.    Marland Kitchen ipratropium (ATROVENT) 0.03 % nasal spray     . Multiple Vitamins-Minerals (MULTIVITAMIN PO) Take 1 tablet by mouth daily.    . nitroGLYCERIN (NITROLINGUAL) 0.4 MG/SPRAY spray USE 1 SPRAY ON OR UNDER THE TONGUE EVERY 5 MINUTES FOR 3 DOSES AS NEEDED FOR CHEST PAIN AS DIRECTED BY PRESCRIBER OR PACKAGE INSTRUCTIONS 24 g 3  . omeprazole (PRILOSEC) 10 MG capsule Take 10 mg by mouth daily.    Marland Kitchen oxybutynin (DITROPAN) 5 MG tablet Take 5 mg by mouth 3 (three) times daily.    Marland Kitchen telmisartan-hydrochlorothiazide (MICARDIS HCT) 80-12.5 MG tablet Take 1 tablet by mouth daily. 90 tablet 3   No current facility-administered medications for this visit.    Pertinent items are noted in  HPI.   Review of Systems:     Cardiac Review of Systems: [Y] = yes  or   [ N ] = no   Chest  Pain [  y  ]  Resting SOB [ n  ] Exertional SOB  Blue.Reese  ]  Orthopnea Florencio.Farrier  ]   Pedal Edema [ n  ]    Palpitations [ n ] Syncope  [ n ]   Presyncope [ n  ]   General Review of Systems: [Y] = yes [  ]=no Constitional: recent weight change [  ];  Wt loss over the last 3 months [   ] anorexia [  ]; fatigue [  ]; nausea [  ]; night sweats [  ]; fever [  ]; or chills [  ];           Eye : blurred vision [  ]; diplopia [   ]; vision changes [  ];  Amaurosis fugax[  ]; Resp: cough [  ];  wheezing[y  ];  hemoptysis[  ]; shortness of breath[  ]; paroxysmal nocturnal dyspnea[  ]; dyspnea on exertion[y  ]; or orthopnea[  ];  GI:  gallstones[  ], vomiting[  ];  dysphagia[  ]; melena[  ];  hematochezia [  ]; heartburn[  ];   Hx of  Colonoscopy[y dec 2020  ]; GU: kidney stones [ y ]; hematuria[  ];   dysuria [  ];  nocturia[  ];  history of     obstruction [  ]; urinary frequency [  ]             Skin: rash, swelling[  ];, hair loss[  ];  peripheral edema[  ];  or itching[  ]; Musculosketetal: myalgias[  ];  joint swelling[ y ];  joint erythema[  ];  joint pain[ y ];  back pain[  ];  Heme/Lymph: bruising[  ];  bleeding[  ];  anemia[  ];  Neuro: TIA[  ];  headaches[  ];  stroke[  ];  vertigo[  ];  seizures[  ];   paresthesias[  ];  difficulty walking[  ];  Psych:depression[  ]; anxiety[  ];  Endocrine: diabetes[  ];  thyroid dysfunction[  ];  Immunizations: Flu up to date Blue.Reese  ]; Pneumococcal up to date [ y ];  Other:  Recent EGD and colonoscopy: Diagnosis 1. Surgical [P], esophagus BX @ 41cm - INTESTINAL METAPLASIA CONSISTENT WITH BARRETT'S ESOPHAGUS. - NO DYSPLASIA OR MALIGNANCY. 2. Surgical [P], esophagus BX @ 39cm - INTESTINAL METAPLASIA CONSISTENT WITH BARRETT'S ESOPHAGUS. - NO DYSPLASIA OR MALIGNANCY. 3. Surgical [P], colon, ascending, hepatic flexure, polyp (3) - TUBULAR ADENOMA (X3 FRAGMENTS). - NO HIGH GRADE DYSPLASIA OR MALIGNANCY. JULIA MANNY MD   PHYSICAL EXAMINATION: BP 122/63 (BP Location:  Right Arm, Patient Position: Sitting, Cuff Size: Normal)   Pulse 62   Temp 98.1 F (36.7 C)   Resp 16   Ht 5' 8.5" (1.74 m)   Wt 90.7 kg   SpO2 95% Comment: RA  BMI 29.97 kg/m  General appearance: alert, cooperative and no distress Head: Normocephalic, without obvious abnormality, atraumatic Neck: no adenopathy, no carotid bruit, no JVD, supple, symmetrical, trachea midline and thyroid not enlarged, symmetric, no tenderness/mass/nodules Lymph nodes: Cervical, supraclavicular, and axillary nodes normal. Resp: clear to auscultation bilaterally Back: symmetric, no curvature. ROM normal. No CVA tenderness. Cardio: regular rate and rhythm, S1, S2 normal, no murmur, click, rub or gallop GI: soft, non-tender; bowel  sounds normal; no masses,  no organomegaly and healed midline abdominal incision Extremities: extremities normal, atraumatic, no cyanosis or edema Neurologic: Grossly normal  Diagnostic Studies & Laboratory data:     Recent Radiology Findings:   NM PET Image Initial (PI) Skull Base To Thigh  Result Date: 01/05/2020 CLINICAL DATA:  Initial treatment strategy for new 1.6 cm nodule in the posterior right upper lobe on screening CT. EXAM: NUCLEAR MEDICINE PET SKULL BASE TO THIGH TECHNIQUE: 11.4 mCi F-18 FDG was injected intravenously. Full-ring PET imaging was performed from the skull base to thigh after the radiotracer. CT data was obtained and used for attenuation correction and anatomic localization. Fasting blood glucose: 92 mg/dl COMPARISON:  Screening CT 12/27/2019. FINDINGS: Mediastinal blood pool activity: SUV max 2.2 Liver activity: SUV max NA NECK: No areas of abnormal hypermetabolism. Incidental CT findings: No cervical adenopathy. Left maxillary sinus mucosal thickening. CHEST: No cervical nodal hypermetabolism. Hypermetabolism corresponding to the perifissural right upper lobe pulmonary nodule. 1.4 cm and a S.U.V. max of 9.2 on 105/3. Incidental CT findings: Deferred to recent  screening CT. Aortic and coronary artery atherosclerosis. Centrilobular emphysema. ABDOMEN/PELVIS: No abdominopelvic parenchymal or nodal hypermetabolism. Incidental CT findings: Left adrenal thickening. Right adrenal 1.6 cm adenoma. High left hepatic lobe subcentimeter cyst. Surgical changes of gastrojejunostomy. Mild prostatomegaly. Small fat containing right inguinal hernia. SKELETON: No abnormal marrow activity. Incidental CT findings: Degenerative changes of both hips. IMPRESSION: 1. Hypermetabolic posterior right upper lobe lung nodule, consistent with primary bronchogenic carcinoma. 2. No thoracic nodal or extrathoracic hypermetabolic metastasis. 3. Presuming non-small-cell histology, T1bN0M0 or stage IA. 4. Aortic atherosclerosis (ICD10-I70.0), coronary artery atherosclerosis and emphysema (ICD10-J43.9). 5. Right adrenal adenoma. Electronically Signed   By: Abigail Miyamoto M.D.   On: 01/05/2020 16:24   CT CHEST LUNG CA SCREEN LOW DOSE W/O CM  Result Date: 12/27/2019 CLINICAL DATA:  Former smoker, quit 8 years ago, 40 pack-year history. EXAM: CT CHEST WITHOUT CONTRAST LOW-DOSE FOR LUNG CANCER SCREENING TECHNIQUE: Multidetector CT imaging of the chest was performed following the standard protocol without IV contrast. COMPARISON:  12/19/2018. FINDINGS: Cardiovascular: Atherosclerotic calcification of the aorta and coronary arteries. Heart size normal. No pericardial effusion. Mediastinum/Nodes: No pathologically enlarged mediastinal or axillary lymph nodes. Hilar regions are difficult to definitively evaluate without IV contrast but appear grossly unremarkable. Esophagus is grossly unremarkable. Lungs/Pleura: Mild centrilobular emphysema. Calcified granulomas. There is a new 15.7 mm nodule in the peripheral aspect of the posterior segment right upper lobe (9/120). Other noncalcified pulmonary nodules measure 3.9 mm or less in size. No pleural fluid. Airway is unremarkable. Upper Abdomen: Subcentimeter  low-attenuation lesion in the dome of liver is too small to characterize. Fluid density nodule in the right adrenal gland measures 2.1 cm. Visualized portions of the left adrenal gland, kidneys, spleen and pancreas are otherwise unremarkable. Postoperative changes are seen in the distal stomach. No upper abdominal adenopathy. Musculoskeletal: Degenerative changes in the spine. No worrisome lytic or sclerotic lesions. IMPRESSION: 1. New 15.7 mm nodule in the posterior segment right upper lobe. Lung-RADS 4B, suspicious. Additional imaging evaluation or consultation with Pulmonology or Thoracic Surgery recommended. These results will be called to the ordering clinician or representative by the Radiologist Assistant, and communication documented in the PACS or zVision Dashboard. 2. Right adrenal adenoma. 3. Aortic atherosclerosis (ICD10-I70.0). Coronary artery calcification. 4.  Emphysema (ICD10-J43.9). Electronically Signed   By: Lorin Picket M.D.   On: 12/27/2019 13:10     I have independently reviewed the above radiology studies  and reviewed the findings with the patient.   Recent Lab Findings: Lab Results  Component Value Date   WBC 7.0 11/13/2013   HGB 13.3 11/13/2013   HCT 39.8 11/13/2013   PLT 197.0 11/13/2013   GLUCOSE 105 (H) 08/02/2017   NA 138 08/02/2017   K 5.1 08/02/2017   CL 106 08/02/2017   CREATININE 0.97 08/02/2017   BUN 19 08/02/2017   CO2 22 08/02/2017   INR 1.0 11/13/2013   PFT's 01/09/2020 FEV1     2.58    83% DLCO   18.01  72%  Assessment / Plan:   #1 probable clinical stage Ia, T1N0M0 non-small cell lung cancer right upper lobe peripherally located #2 history of Billroth II in the setting of short segment Barrett's esophagus-followed for dysplasia most recent endoscopy December 2020 #3 history of coronary artery disease with extensive calcifications noted on CT, history of stent placement right coronary artery 1996-vague symptoms of exertionally related shortness of  breath, he denies outright chest pain.  I reviewed with the patient and his wife the likely diagnosis considering the radiographic findings on CT and PET.  The most direct approach in his treatment would be surgical resection of the right upper lobe lung mass and possible right upper lobectomy.  PFTs are adequate to tolerate resection.  Alternate treatment occluding stereotactic radiotherapy was discussed.  We discussed surgical procedure involving lung resection, including video-assisted thoracoscopy and/or robotic surgery.  We will obtain cardiac clearance.  And then tentatively plan to see the patient back in the office February 1 proceed with surgery February 3.      Grace Isaac MD      [address] [place], [address] Office [phone]     01/09/2020 3:23 PM

## 2020-01-09 NOTE — Telephone Encounter (Signed)
Received message patient needing cardiac clearance ASAP per Dr Servando Snare. Left message to call back

## 2020-01-09 NOTE — Telephone Encounter (Signed)
Daniel Colon,   This patient has been scheduled for a PFT on 01/09/20 and an office visit with you on 01/17/20 at 1:30.  He has been scheduled for a bronchoscopy on 01/24/20.  Please advise if there is any reason why he should not keep his appointment to see you oni 01/17/20? Thank you.

## 2020-01-09 NOTE — Patient Instructions (Signed)
Pulmonary Nodule A pulmonary nodule is a small, round growth of tissue in the lung. It is sometimes referred to as a "shadow" or "spot on the lung." Nodules range in size from less than 1/5 of an inch (4 mm) to a little bigger than an inch (30 mm). Pulmonary nodules can be either noncancerous (benign) or cancerous (malignant). Most are noncancerous. Smaller nodules in people who do not smoke and do not have any other risk factors for lung cancer are more likely to be noncancerous. Larger, irregular nodules in people who smoke or who have a strong family history of lung cancer are more likely to be cancerous. What are the causes? This condition may be caused by:  A bacterial, fungal, or viral infection, such as tuberculosis. The infection is usually an old and inactive one.  A noncancerous mass of tissue.  Inflammation from conditions such as rheumatoid arthritis.  Abnormal blood vessels in the lungs.  Cancerous tissue, such as lung cancer or a cancer in another part of the body that has spread to the lung. What are the signs or symptoms? This condition usually does not cause symptoms. If symptoms appear, they are usually related to the underlying cause. For example, if the condition is caused by an infection, you may have a cough or fever. How is this diagnosed? This condition is usually diagnosed with an X-ray or CT scan. To help determine whether a pulmonary nodule is benign or malignant, your health care provider will:  Take your medical history.  Perform a physical exam.  Order tests, including: ? Blood tests. ? A skin test called a tuberculin test. This test is done to check if you have been exposed to the germ that causes tuberculosis. ? Chest X-rays. ? A CT scan. This test shows smaller pulmonary nodules more clearly and with more detail than an X-ray. ? A positron emission tomography (PET) scan. This test is done to check if the nodule is cancerous. During the test, a safe amount  of a radioactive substance is injected into the bloodstream. Then a picture is taken. ? Biopsy. In this test, a tiny piece of the pulmonary nodule is removed and then examined under a microscope. How is this treated? Treatment for this condition depends on whether the pulmonary nodule is malignant or benign as well as your risk of getting cancer.  Noncancerous nodules usually do not need to be treated, but they may need to be monitored with CT scans. If a CT scan shows that the pulmonary nodule got bigger, more tests may be done.  Some nodules need to be removed. If this is the case, you may have a procedure called a thoractomy. During the procedure, your health care provider will make an incision in your chest and remove the part of the lung where the nodule is located. Follow these instructions at home:   Take over-the-counter and prescription medicines only as told by your health care provider.  Do not use any products that contain nicotine or tobacco, such as cigarettes and e-cigarettes. If you need help quitting, ask your health care provider.  Keep all follow-up visits as told by your health care provider. This is important. Contact a health care provider if:  You have trouble breathing when you are active.  You feel sick or unusually tired.  You do not feel like eating.  You lose weight without trying.  You develop chills or night sweats. Get help right away if:  You cannot catch your breath.  You begin wheezing.  You cannot stop coughing.  You cough up blood.  You become dizzy or feel like you are going to faint.  You have sudden chest pain.  You have a fever or persistent symptoms for more than 2-3 days.  You have a fever and your symptoms suddenly get worse. Summary  A pulmonary nodule is a small, round growth of tissue in the lung. Most pulmonary nodules are noncancerous.  This condition is usually diagnosed with an X-ray or CT scan.  Common causes of  pulmonary nodules include infection, inflammation, and noncancerous growths.  Though less common, if a nodule is found to be cancerous, you will need specific diagnostic tests and treatment options as directed by your medical provider.  Treatment for this condition depends on whether the pulmonary nodule is benign or malignant as well as your risk of getting cancer. This information is not intended to replace advice given to you by your health care provider. Make sure you discuss any questions you have with your health care provider. Document Revised: 12/31/2017 Document Reviewed: 01/05/2017 Elsevier Patient Education  2020 [place] Lung cancer is an abnormal growth of cancerous cells that forms a mass (malignant tumor) in a lung. There are several types of lung cancer. The types are based on the appearance of the tumor cells. The two most common types are:  Non-small cell lung cancer. This type of lung cancer is the most common type. Non-small cell lung cancers include squamous cell carcinoma, adenocarcinoma, and large cell carcinoma.  Small cell lung cancer. In this type of lung cancer, abnormal cells are smaller than those of non-small cell lung cancer. Small cell lung cancer gets worse (progresses) faster than non-small cell lung cancer. What are the causes? The most common cause of lung cancer is smoking tobacco. The second most common cause is exposure to a chemical called radon. What increases the risk? You are more likely to develop this condition if:  You smoke tobacco.  You have been exposed to: ? Secondhand tobacco smoke. ? Radon gas. ? Uranium. ? Asbestos. ? Arsenic in drinking water. ? Air pollution.  You have a family or personal history of lung cancer.  You have had lung radiation therapy in the past.  You are older than age 62. What are the signs or symptoms? In the early stages, you may not have any symptoms. As the cancer progresses, symptoms  may include:  A lasting cough, possibly with blood.  Fatigue.  Unexplained weight loss.  Shortness of breath.  Loud breathing (wheezing).  Chest pain.  Loss of appetite. Symptoms of advanced lung cancer include:  Hoarseness.  Bone or joint pain.  Weakness.  Change in the structure of the fingernails (clubbing), so that the nail looks like an upside-down spoon.  Swelling of the face or arms.  Inability to move the face (paralysis).  Drooping eyelids. How is this diagnosed? This condition may be diagnosed based on:  Your symptoms and medical history.  A physical exam.  A chest X-ray.  A CT scan.  Blood tests.  Sputum tests.  Removal of a sample of lung tissue (lung biopsy) for testing. Your cancer will be assessed (staged) to determine how severe it is and how much it has spread (metastasized). How is this treated? Treatment depends on the type and stage of your cancer. Treatment may include one or more of the following:  Surgery to remove as much of the cancer as possible. Lymph nodes  in the area may be removed and tested for cancer as well.  Medicines that kill cancer cells (chemotherapy).  High-energy rays that kill cancer cells (radiation therapy).  Chemotherapy. This treatment uses medicines to destroy cancer cells.  Targeted therapy. This targets specific parts of cancer cells and the area around them to block the growth and spread of the cancer. Targeted therapy can help limit the damage to healthy cells. Follow these instructions at home: Eating and drinking  Some of your treatments might affect your appetite. If you are having problems eating, or if you do not have an appetite, meet with a dietitian.  If you have side effects that affect your appetite, it may help to: ? Eat smaller meals and snacks often. ? Drink high-nutrition and high-calorie shakes or supplements. ? Eat bland and soft foods that are easy to eat. ? Avoid eating foods that  are hot, spicy, or hard to swallow. General instructions   Do not use any products that contain nicotine or tobacco, such as cigarettes and e-cigarettes. If you need help quitting, ask your health care provider.  Do not drink alcohol.  If you are admitted to the hospital, make sure your cancer specialist (oncologist) is aware. Your cancer may affect your treatment for other conditions.  Take over-the-counter and prescription medicines only as told by your health care provider.  Consider joining a support group for people who have been diagnosed with lung cancer.  Work with your health care provider to manage any side effects of treatment.  Keep all follow-up visits as told by your health care provider. This is important. Where to find more information  American Cancer Society: https://www.cancer.[place] https://www.cancer.gov Contact a health care provider if you:  Lose weight without trying.  Have a persistent cough and wheezing.  Feel short of breath.  Get tired easily.  Have bone or joint pain.  Have difficulty swallowing.  Notice that your voice is changing or getting hoarse.  Have pain that does not get better with medicine. Get help right away if you:  Cough up blood.  Have new breathing problems.  Have chest pain.  Have a fever.  Have swelling in an ankle, leg, or arm, or the face or neck.  Have paralysis in your face.  Are very confused.  Have a drooping eyelid. Summary  Lung cancer is an abnormal growth of cancerous cells that forms a mass (malignant tumor) in a lung.  There are several types of lung cancer. The types are based on the appearance of the tumor cells. The two most common types are non-small cell and small cell.  The most common cause of lung cancer is smoking tobacco.  Early symptoms include a lasting cough, possibly with blood, and fatigue, unexplained weight loss, and shortness of breath.  After  diagnosis, treatment depends on the type and stage of your cancer. This information is not intended to replace advice given to you by your health care provider. Make sure you discuss any questions you have with your health care provider. Document Revised: 11/19/2017 Document Reviewed: 10/14/2017 Elsevier Patient Education  2020 [place].  Lung Resection  A lung resection is a procedure to remove part or all of a lung. An entire lung may be removed (pneumonectomy), or only part of it may be removed (lobectomy). A lung resection may be done as an open surgery or as a minimally invasive surgery. Video-Assisted Thoracic Surgery Video-assisted thoracic surgery (VATS) is a procedure that  allows your surgeon to look inside your chest and perform minor procedures as needed. The thoracic area is between the neck and abdomen. VATS is commonly done to: Study or diagnose problems in the chest. Remove a tissue sample (biopsy) to be examined. Put medicines directly into the chest. Remove collections of fluid, pus, or blood. Remove tumors. Remove a part of a lung (lobectomy). Treat some problems with the spine, including: Abnormal curves (scoliosis or kyphosis). Breaks (fractures). Tumors. VATS is done using thoracoscopy. Thoracoscopy is a procedure in which a thin tube with a light and camera on the end (thoracoscope) is inserted through a small incision in the chest wall. The thoracoscope sends images to a video monitor that your surgeon will use to view the inside of your chest. Tell a health care provider about: Any allergies you have. All medicines you are taking, including vitamins, herbs, eye drops, creams, and over-the-counter medicines. Any problems you or family members have had with anesthetic medicines. Any surgeries you have had. Any medical conditions you have. Any blood disorders you have. Whether you are pregnant or may be pregnant. What are the risks? Generally, this is a safe  procedure. However, problems may occur, including: Infection. Severe bleeding (hemorrhage). Allergic reaction to medicines. Damage to structures or organs in the chest, such as nerves. Lung infection (pneumonia). Inability to complete the procedure. If this is the case, your chest may need to be opened with a large incision (thoracotomy). What happens before the procedure? Medicines Ask your health care provider about: Changing or stopping your regular medicines. This is especially important if you are taking diabetes medicines or blood thinners. Taking medicines such as aspirin and ibuprofen. These medicines can thin your blood. Do not take these medicines before your procedure if your health care provider instructs you not to. You may be given antibiotic medicine to help prevent infection. Staying hydrated Follow instructions from your health care provider about hydration, which may include: Up to 2 hours before the procedure - you may continue to drink clear liquids, such as water, clear fruit juice, black coffee, and plain tea. Eating and drinking restrictions Follow instructions from your health care provider about eating and drinking, which may include: 8 hours before the procedure - stop eating heavy meals or foods such as meat, fried foods, or fatty foods. 6 hours before the procedure - stop eating light meals or foods, such as toast or cereal. 6 hours before the procedure - stop drinking milk or drinks that contain milk. 2 hours before the procedure - stop drinking clear liquids. General instructions Ask your health care provider how your surgical site will be marked or identified. You may be asked to shower with a germ-killing soap. You may have a blood or urine sample taken. You may have imaging tests, such as: Chest X-ray. Electrocardiogram (ECG). Ultrasound. CT scan. Do not use any products that contain nicotine or tobacco for as long as possible before your procedure.  These include cigarettes and e-cigarettes. If you need help quitting, ask your health care provider. Plan to have someone take you home from the hospital or clinic. If you will be going home right after the procedure, plan to have someone with you for 24 hours. What happens during the procedure? To lower your risk of infection: Your health care team will wash or sanitize their hands. Your skin will be washed with soap. An IV tube will be inserted into one of your veins. You will be given one or  more of the following: A medicine to make you fall asleep (general anesthetic). A medicine to help you relax (sedative). A medicine that is injected into an area of your body to numb everything below the injection site (regional anesthetic). This is less common for this procedure. It may be given if you are not able to tolerate general anesthetic. A thin tube (catheter) will be inserted into your bladder and your tube that carries urine out of your body (urethra). The catheter will drain your urine. 1-4 small incisions will be made in your chest. The number of incisions depends on the purpose of your procedure. The thoracoscope will be inserted into your incision(s) and used to view the inside of your chest. One of your lungs will be deflated. This makes it easier for your surgeon to see the area. Other surgical instruments may be inserted through your incision(s) to perform necessary procedures. After any procedures are done, your lung will be inflated. A chest tube may be inserted through an incision to drain excess fluid from the surgical area. Any remaining incisions will be closed with stitches (sutures) or staples. A bandage (dressing) may be placed over your incision(s). The procedure may vary among health care providers and hospitals. What happens after the procedure? Your blood pressure, heart rate, breathing rate, and blood oxygen level will be monitored until the medicines you were given have  worn off. You may have a chest tube draining fluid from the surgical area. The chest tube will be closely monitored for signs of fluid or air buildup in your lungs. You may continue to receive fluids and medicines through an IV tube. You may have to wear compression stockings. These stockings help to prevent blood clots and reduce swelling in your legs. Do not drive for 24 hours if you were given a sedative. Summary Video-assisted thoracic surgery (VATS) is a procedure that allows your surgeon to look inside your chest to study or diagnose problems in your thoracic area. VATS is done by inserting a thin tube with a light and camera on the end (thoracoscope) through a small incision in the chest wall. After the procedure, you may have a chest tube draining fluid from the surgical area. The chest tube will be closely monitored for signs of fluid or air buildup in your lungs. This information is not intended to replace advice given to you by your health care provider. Make sure you discuss any questions you have with your health care provider. Document Revised: 11/19/2017 Document Reviewed: 11/23/2016 Elsevier Patient Education  [place] lung resection is most often done to remove a tumor or cancer, but it may be done to treat other conditions. The procedure can relieve symptoms and keep the problem from getting worse. Tell a health care provider about:  Any allergies you have.  All medicines you are taking, including vitamins, herbs, eye drops, creams, and over-the-counter medicines.  Any problems you or family members have had with anesthetic medicines.  Any blood disorders you have.  Any surgeries you have had.  Any medical conditions you have.  Whether you are pregnant or may be pregnant. What are the risks? Generally, this is a safe procedure. However, problems may occur, including:  Excessive bleeding.  Infection.  Reaction to anesthesia.  Allergic reaction to  medicines.  Blood clots.  Injury to a nerve or blood vessel.  Problems breathing.  Heart problems.  Stroke. What happens before the procedure? Staying hydrated Follow instructions from your health  care provider about hydration, which may include:  Up to 2 hours before the procedure - you may continue to drink clear liquids, such as water, clear fruit juice, black coffee, and plain tea. Eating and drinking restrictions Follow instructions from your health care provider about eating and drinking, which may include:  8 hours before the procedure - stop eating heavy meals or foods such as meat, fried foods, or fatty foods.  6 hours before the procedure - stop eating light meals or foods, such as toast or cereal.  6 hours before the procedure - stop drinking milk or drinks that contain milk.  2 hours before the procedure - stop drinking clear liquids. Medicines Ask your health care provider about:  Changing or stopping your regular medicines. This is especially important if you are taking diabetes medicines or blood thinners.  Taking medicines such as aspirin and ibuprofen. These medicines can thin your blood. Do not take these medicines unless your health care provider tells you to take them.  Taking over-the-counter medicines, vitamins, herbs, and supplements. Tests You may have tests done before the procedure, including:  Blood and urine tests.  Imaging tests, such as X-rays, CT, MRI, or PET scans.  Bronchoscopy. In this procedure, a health care provider uses a flexible tube (bronchoscope) to look at the inside of your airways.  Pulmonary function tests. These are done to check how well your lungs work.  Heart testing. This is done to check heart function before the procedure.  Lymph node sampling. This may be done to see if you have a tumor that has spread. General instructions  Plan to have someone take you home from the hospital or clinic.  Plan to have a  responsible adult care for you for at least 24 hours after you leave the hospital or clinic. This is important.  Do not use any products that contain nicotine or tobacco for as long as possible before your procedure. These include cigarettes and e-cigarettes. If you need help quitting, ask your health care provider.  Ask your health care provider what steps will be taken to help prevent infection. These may include: ? Removing hair at the surgery site. ? Washing skin with a germ-killing soap. ? Taking antibiotic medicine. What happens during the procedure?  An IV will be inserted into one of your veins. You will be given one or both of the following: ? A medicine to help you relax (sedative). ? A medicine to make you fall asleep (general anesthetic).  A breathing tube will be placed in your throat.  A thin tube (catheter) may be inserted into the part of your body that drains urine from the bladder (urethra). The catheter will drain your urine.  Your health care provider will make a large incision on your side (open lung surgery) or several small incisions over your chest area (minimally invasive surgery).  Your health care provider will carefully cut any tissues leading to the area of the lung being treated.  The lung or part of the lung will then be removed. Lymph nodes near the lung may also be removed for testing.  Your health care provider will check inside your chest to make sure there is no bleeding in or around the lungs.  Your health care provider may put tubes into your chest to drain extra fluid and air after surgery.  Your incisions will be closed. This may be done using stitches (sutures), staples, skin glue, or skin tape (adhesive) strips.  A bandage (  dressing) may be placed over your incisions. The procedure may vary among health care providers and hospitals. What happens after the procedure?  Your blood pressure, heart rate, breathing rate, and blood oxygen level  will be monitored until you leave the hospital or clinic.  Right after surgery, you may: ? Be moved to the intensive care unit (ICU). ? Continue to have a tube to help you breathe or have a urinary catheter. ? Have an IV for fluids and medicines. ? Remain on a respirator, if assistance is needed to help you breathe. ? Start respiratory therapy in the ICU. This will help your other lung to get strong and stay healthy. ? Be given medicine to help with pain and nausea.  You may have to wear compression stockings. These stockings help to prevent blood clots and reduce swelling in your legs.  As you continue to recover: ? You will be moved to a regular hospital room. Therapy will continue. ? You may be released to go home or to an extended care facility. Summary  A lung resection is a procedure to remove part or all of a lung. It can be done as an open surgery or a minimally invasive surgery.  A lung resection is most often done to remove a tumor or cancer, but it may be done to treat other conditions.  After surgery, respiratory therapy will be prescribed to help your lung recover and become stronger. This information is not intended to replace advice given to you by your health care provider. Make sure you discuss any questions you have with your health care provider. Document Revised: 12/20/2017 Document Reviewed: 12/20/2017 Elsevier Patient Education  2020 [place] Thoracic Surgery, Care After This sheet gives you information about how to care for yourself after your procedure. Your health care provider may also give you more specific instructions. If you have problems or questions, contact your health care provider. What can I expect after the procedure? After the procedure, it is common to have:  Some pain and soreness in your chest.  Pain when breathing in (inhaling) and coughing.  Constipation.  Fatigue.  Difficulty sleeping. Follow these instructions  at home: Preventing pneumonia  Take deep breaths or do breathing exercises as instructed by your health care provider. Doing this helps prevent lung infection (pneumonia).  Cough frequently. Coughing may cause discomfort, but it is important to clear mucus (phlegm) and expand your lungs. If it hurts to cough, hold a pillow against your chest or place the palms of both hands on top of the incision (use splinting) when you cough. This may help relieve discomfort.  If you were given an incentive spirometer, use it as directed. An incentive spirometer is a tool that measures how well you are filling your lungs with each breath.  Participate in pulmonary rehabilitation as directed by your health care provider. This is a program that combines education, exercise, and support from a team of specialists. The goal is to help you heal and get back to your normal activities as soon as possible. Medicines  Take over-the-counter or prescription medicines only as told by your health care provider.  If you have pain, take pain-relieving medicine before your pain becomes severe. This is important because if your pain is under control, you will be able to breathe and cough more comfortably.  If you were prescribed an antibiotic medicine, take it as told by your health care provider. Do not stop taking the antibiotic even if you  start to feel better. Activity  Ask your health care provider what activities are safe for you.  Avoid activities that use your chest muscles for at least 3-4 weeks.  Do not lift anything that is heavier than 10 lb (4.5 kg), or the limit that your health care provider tells you, until he or she says that it is safe. Incision care  Follow instructions from your health care provider about how to take care of your incision(s). Make sure you: ? Wash your hands with soap and water before you change your bandage (dressing). If soap and water are not available, use hand  sanitizer. ? Change your dressing as told by your health care provider. ? Leave stitches (sutures), skin glue, or adhesive strips in place. These skin closures may need to stay in place for 2 weeks or longer. If adhesive strip edges start to loosen and curl up, you may trim the loose edges. Do not remove adhesive strips completely unless your health care provider tells you to do that.  Keep your dressing dry until it has been removed.  Check your incision area every day for signs of infection. Check for: ? Redness, swelling, or pain. ? Fluid or blood. ? Warmth. ? Pus or a bad smell. Bathing  Do not take baths, swim, or use a hot tub until your health care provider approves. You may take showers.  After your dressing has been removed, use soap and water to gently wash your incision area. Do not use anything else to clean your incision(s) unless your health care provider tells you to do this. Driving   Do not drive until your health care provider approves.  Do not drive or use heavy machinery while taking prescription pain medicine. Eating and drinking  Eat a healthy, balanced diet as instructed by your health care provider. A healthy diet includes plenty of fresh fruits and vegetables, whole grains, and low-fat (lean) proteins.  Limit foods that are high in fat and processed sugars, such as fried and sweet foods.  Drink enough fluid to keep your urine clear or pale yellow. General instructions   To prevent or treat constipation while you are taking prescription pain medicine, your health care provider may recommend that you: ? Take over-the-counter or prescription medicines. ? Eat foods that are high in fiber, such as beans, fresh fruits and vegetables, and whole grains.  Do not use any products that contain nicotine or tobacco, such as cigarettes and e-cigarettes. If you need help quitting, ask your health care provider.  Avoid secondhand smoke.  Wear compression stockings as  told by your health care provider. These stockings help to prevent blood clots and reduce swelling in your legs.  If you have a chest tube, care for it as instructed by your health care provider. Do not travel by airplane during the 2 weeks after your chest tube is removed, or until your health care provider says that this is safe.  Keep all follow-up visits as told by your health care provider. This is important. Contact a health care provider if:  You have redness, swelling, or pain around an incision.  You have fluid or blood coming from an incision.  Your incision area feels warm to the touch.  You have pus or a bad smell coming from an incision.  You have a fever or chills.  You have nausea or vomiting.  You have pain that does not get better with medicine. Get help right away if:  You have  chest pain.  Your heart is fluttering or beating rapidly.  You develop a rash.  You have shortness of breath or trouble breathing.  You are confused.  You have trouble speaking.  You feel weak, light-headed, or dizzy.  You faint. Summary  To help prevent lung infection (pneumonia), take deep breaths or do breathing exercises as instructed by your health care provider.  Cough frequently to clear mucus (phlegm) and expand your lungs. If it hurts to cough, hold a pillow against your chest or place the palms of both hands on top of the incision (use splinting) when you cough.  If you have pain, take pain-relieving medicine before your pain becomes severe. This is important because if your pain is under control, you will be able to breathe and cough more comfortably.  Ask your health care provider what activities are safe for you. This information is not intended to replace advice given to you by your health care provider. Make sure you discuss any questions you have with your health care provider. Document Revised: 11/19/2017 Document Reviewed: 11/16/2016 Elsevier Patient Education   2020 [employer].

## 2020-01-10 ENCOUNTER — Encounter: Payer: Self-pay | Admitting: *Deleted

## 2020-01-10 ENCOUNTER — Other Ambulatory Visit: Payer: Self-pay | Admitting: *Deleted

## 2020-01-10 ENCOUNTER — Encounter: Payer: Self-pay | Admitting: Cardiothoracic Surgery

## 2020-01-10 DIAGNOSIS — R911 Solitary pulmonary nodule: Secondary | ICD-10-CM

## 2020-01-10 NOTE — Telephone Encounter (Signed)
Patient returning call.

## 2020-01-10 NOTE — Telephone Encounter (Signed)
Left detailed message per the DPR advising of the response received from Eric Form, NP. Marland Kitchen  Recall placed for 3 month follow up. Appointment scheduled for 01/17/20 cancelled.

## 2020-01-10 NOTE — Telephone Encounter (Signed)
It is ok to cancel the appointment. He is having a wedge resection on 2/4 per Dr. Servando Snare.Please set him up for a follow up appointment in 3 months. Thanks

## 2020-01-10 NOTE — Telephone Encounter (Signed)
Spoke with patient and offered him sooner appointment, he is scheduled for Friday 1/22

## 2020-01-10 NOTE — Telephone Encounter (Signed)
Pt aware has surgical clearance appt with Dr Percival Spanish on 01/15/20 at 9:20 am the patient's procedure is 2/3 with Dr Servando Snare for bronchoscopy .Adonis Housekeeper

## 2020-01-11 DIAGNOSIS — Z7189 Other specified counseling: Secondary | ICD-10-CM | POA: Insufficient documentation

## 2020-01-11 DIAGNOSIS — Z01810 Encounter for preprocedural cardiovascular examination: Secondary | ICD-10-CM | POA: Insufficient documentation

## 2020-01-11 NOTE — Progress Notes (Signed)
Cardiology Office Note   Date:  01/12/2020   ID:  [name], [name] May 03, 1946, MRN [id number]  PCP:  Daniel Battles, MD  Cardiologist:   No primary care provider on file.   Chief Complaint  Patient presents with  . Pre-op Exam      History of Present Illness: Daniel Colon is a 74 y.o. male who presents for follow up of  CAD, s/p stent to RCA in 1996 in [place].  RoutineETT(11/13/13) was abnormal with early ST changes to suggest ischemia. He was set up had LHC (11/17/13): pLAD 30-40, D1 30, mLAD 70-75, mCFX 70, mRCA stent 40 (ISR), EF 55-65%.  He was managed medically.  He returns for follow up.  He is to have a lung wedge resection and needs preop clearance.  He has a new right upper lobe lung lesion.     Since I saw him in October he has done well. The patient denies any new symptoms such as chest discomfort, neck or arm discomfort. There has been no new shortness of breath, PND or orthopnea. There have been no reported palpitations, presyncope or syncope.  He has been active walking.    Past Medical History:  Diagnosis Date  . Allergy   . Arthritis   . Barrett's esophagus   . CAD (coronary artery disease)    a. Stent to the [place] (in [place]);  b. ETT(11/13/13): abnormal with early ST changes to suggest ischemia;   c. LHC (11/17/13):  pLAD 30-40, D1 30, mLAD 70-75, mCFX 70, mRCA stent 40 (ISR), EF 55-65%.  Medical Rx  . Cataract    left eye removed, right immature  . Colon polyps   . Dyslipidemia   . GERD (gastroesophageal reflux disease)   . Hypertension   . Myocardial infarction ([place]) 1996  . Nephrolithiasis    kidney stone  . PUD (peptic ulcer disease)     Past Surgical History:  Procedure Laterality Date  . ANGIOPLASTY     stent - rt cor. art  . BILROTH II PROCEDURE    . COLONOSCOPY    . LEFT HEART CATHETERIZATION WITH CORONARY ANGIOGRAM N/A 11/17/2013   Procedure: LEFT HEART CATHETERIZATION WITH CORONARY ANGIOGRAM;  Surgeon: Blane Ohara, MD;   Location: [facility] CATH LAB;  Service: Cardiovascular;  Laterality: N/A;  . POLYPECTOMY    . ulcer surgery    . ULNAR NERVE TRANSPOSITION Right 08/05/2017   Procedure: RIGHT ULNAR NERVE DECOMPRESSION;  Surgeon: Leanora Cover, MD;  Location: [facility];  Service: Orthopedics;  Laterality: Right;     Current Outpatient Medications  Medication Sig Dispense Refill  . acetaminophen (TYLENOL) 500 MG tablet Take 500-1,000 mg by mouth every 6 (six) hours as needed (for pain.).    Marland Kitchen aspirin EC 81 MG tablet Take 81 mg by mouth 3 (three) times a week. In the morning    . atorvastatin (LIPITOR) 40 MG tablet TAKE 1 TABLET DAILY (Patient taking differently: Take 40 mg by mouth daily. ) 90 tablet 3  . carvedilol (COREG) 12.5 MG tablet Take 12.5 mg by mouth daily.     . Coenzyme Q10 (CO Q-10) 100 MG CAPS Take 100 mg by mouth daily.     Marland Kitchen ezetimibe (ZETIA) 10 MG tablet Take 10 mg by mouth daily.    . fish oil-omega-3 fatty acids 1000 MG capsule Take 1,000 mg by mouth daily.     Marland Kitchen ipratropium (ATROVENT) 0.03 % nasal spray Place 1 spray into both nostrils daily.     Marland Kitchen  Multiple Vitamin (MULTIVITAMIN WITH MINERALS) TABS tablet Take 1 tablet by mouth daily.    . nitroGLYCERIN (NITROLINGUAL) 0.4 MG/SPRAY spray USE 1 SPRAY ON OR UNDER THE TONGUE EVERY 5 MINUTES FOR 3 DOSES AS NEEDED FOR CHEST PAIN AS DIRECTED BY PRESCRIBER OR PACKAGE INSTRUCTIONS (Patient taking differently: Place 1 spray under the tongue every 5 (five) minutes x 3 doses as needed for chest pain. ) 24 g 3  . omeprazole (PRILOSEC) 10 MG capsule Take 10 mg by mouth daily.    Marland Kitchen oxybutynin (DITROPAN) 5 MG tablet Take 5 mg by mouth daily.     Marland Kitchen telmisartan-hydrochlorothiazide (MICARDIS HCT) 80-12.5 MG tablet Take 1 tablet by mouth daily. 90 tablet 3   No current facility-administered medications for this visit.    Allergies:   Patient has no known allergies.    ROS:  Please see the history of present illness.   Otherwise, review of systems  are positive for none.   All other systems are reviewed and negative.    PHYSICAL EXAM: VS:  BP 130/82   Pulse 62   Temp (!) 97.3 F (36.3 C)   Ht 5' 8.8" (1.748 m)   Wt 206 lb 6.4 oz (93.6 kg)   BMI 30.66 kg/m  , BMI Body mass index is 30.66 kg/m. GENERAL:  Well appearing NECK:  No jugular venous distention, waveform within normal limits, carotid upstroke brisk and symmetric, no bruits, no thyromegaly LUNGS:  Clear to auscultation bilaterally CHEST:  Unremarkable HEART:  PMI not displaced or sustained,S1 and S2 within normal limits, no S3, no S4, no clicks, no rubs, no murmurs ABD:  Flat, positive bowel sounds normal in frequency in pitch, no bruits, no rebound, no guarding, no midline pulsatile mass, no hepatomegaly, no splenomegaly EXT:  2 plus pulses throughout, no edema, no cyanosis no clubbing   EKG:  EKG is ordered today. The ekg ordered today demonstrates sinus rhythm, rate 62, axis within normal limits, intervals within normal limits, inferolateral ST depression unchanged from previous.   Recent Labs: No results found for requested labs within last 8760 hours.    Lipid Panel No results found for: CHOL, TRIG, HDL, CHOLHDL, VLDL, LDLCALC, LDLDIRECT    Wt Readings from Last 3 Encounters:  01/12/20 206 lb 6.4 oz (93.6 kg)  01/09/20 200 lb (90.7 kg)  01/03/20 206 lb 9.6 oz (93.7 kg)      Other studies Reviewed: Additional studies/ records that were reviewed today include: Labs. Review of the above records demonstrates:  Please see elsewhere in the note.     ASSESSMENT AND PLAN:  PREOP:   The patient has no high risk findings or symptoms.  He is a high functional level.  Given this and according to ACC/AHA guidelines the patient is at acceptable risk for the planned surgery.  CAD:  He has no new symptoms.  He will continue with aggressive risk reduction.  DYSLIPIDEMIA: LDL was  61 with an HDL of 43.  No change in therapy.   HTN: Blood pressures well  controlled.  No change in therapy.   COVID EDUCATION: He is going to try to schedule the vaccine.  Current medicines are reviewed at length with the patient today.  The patient does not have concerns regarding medicines.  The following changes have been made:  no change  Labs/ tests ordered today include: None No orders of the defined types were placed in this encounter.    Disposition:   FU with me in 12 months.  Signed, Minus Breeding, MD  01/12/2020 4:27 PM    [facility]

## 2020-01-12 ENCOUNTER — Ambulatory Visit (INDEPENDENT_AMBULATORY_CARE_PROVIDER_SITE_OTHER): Payer: Medicare Other | Admitting: Cardiology

## 2020-01-12 ENCOUNTER — Other Ambulatory Visit: Payer: Self-pay

## 2020-01-12 ENCOUNTER — Encounter: Payer: Self-pay | Admitting: Cardiology

## 2020-01-12 VITALS — BP 130/82 | HR 62 | Temp 97.3°F | Ht 68.8 in | Wt 206.4 lb

## 2020-01-12 DIAGNOSIS — I10 Essential (primary) hypertension: Secondary | ICD-10-CM | POA: Diagnosis not present

## 2020-01-12 DIAGNOSIS — Z01810 Encounter for preprocedural cardiovascular examination: Secondary | ICD-10-CM | POA: Diagnosis not present

## 2020-01-12 DIAGNOSIS — Z7189 Other specified counseling: Secondary | ICD-10-CM | POA: Diagnosis not present

## 2020-01-12 DIAGNOSIS — E785 Hyperlipidemia, unspecified: Secondary | ICD-10-CM

## 2020-01-12 NOTE — Patient Instructions (Signed)
Medication Instructions:  No Changes *If you need a refill on your cardiac medications before your next appointment, please call your pharmacy*  Lab Work: None  Testing/Procedures: None  Follow-Up: At [facility], you and your health needs are our priority.  As part of our continuing mission to provide you with exceptional heart care, we have created designated Provider Care Teams.  These Care Teams include your primary Cardiologist (physician) and Advanced Practice Providers (APPs -  Physician Assistants and Nurse Practitioners) who all work together to provide you with the care you need, when you need it.  Your next appointment:   1 year(s)  You will receive a reminder letter in the mail two months in advance. If you don't receive a letter, please call our office to schedule the follow-up appointment.   The format for your next appointment:   In Person  Provider:   James Hochrein, MD   

## 2020-01-15 ENCOUNTER — Ambulatory Visit: Payer: Medicare Other | Admitting: Cardiology

## 2020-01-17 ENCOUNTER — Ambulatory Visit: Payer: Medicare Other | Admitting: Acute Care

## 2020-01-19 NOTE — Progress Notes (Signed)
[facility] - [address], [address] [address] Phone: [phone] Fax: [place], [place] - [address] [place] [address] Phone: [phone] Fax: [phone]  [facility] [place], [place] [address] [place] [address] Phone: [phone] Fax: [phone]      Your procedure is scheduled on January 24, 2020.  Report to [facility] Main Entrance "A" at 6:30 A.M., and check in at the Admitting office.  Call this number if you have problems the morning of surgery:  [phone]  Call [phone] if you have any questions prior to your surgery date Monday-Friday 8am-4pm    Remember:  Do not eat or drink after midnight the night before your surgery   Take these medicines the morning of surgery with A SIP OF WATER: atorvastatin (LIPITOR) carvedilol (COREG)  ezetimibe (ZETIA) ipratropium (ATROVENT) nitroGLYCERIN (NITROLINGUAL) - if needed for chest pain acetaminophen (TYLENOL) - as needed for pain  7 days prior to surgery STOP taking any Aspirin (unless otherwise instructed by your surgeon), Aleve, Naproxen, Ibuprofen, Motrin, Advil, Goody's, BC's, all herbal medications, fish oil, and all vitamins.    The Morning of Surgery  Do not wear jewelry.  Do not wear lotions, powders, colognes, or deodorant   Men may shave face and neck.  Do not bring valuables to the hospital.  [facility] is not responsible for any belongings or valuables.  If you are a smoker, DO NOT Smoke 24 hours prior to surgery  If you wear a CPAP at night please bring your mask the morning of surgery   Remember that you must have someone to transport you home after your surgery, and remain with you for 24 hours if you are discharged the same day.   Please bring cases for  contacts, glasses, hearing aids, dentures or bridgework because it cannot be worn into surgery.    Leave your suitcase in the car.  After surgery it may be brought to your room.  For patients admitted to the hospital, discharge time will be determined by your treatment team.  Patients discharged the day of surgery will not be allowed to drive home.    Special instructions:   [facility]- Preparing For Surgery  Before surgery, you can play an important role. Because skin is not sterile, your skin needs to be as free of germs as possible. You can reduce the number of germs on your skin by washing with CHG (chlorahexidine gluconate) Soap before surgery.  CHG is an antiseptic cleaner which kills germs and bonds with the skin to continue killing germs even after washing.    Oral Hygiene is also important to reduce your risk of infection.  Remember - BRUSH YOUR TEETH THE MORNING OF SURGERY WITH YOUR REGULAR TOOTHPASTE  Please do not use if you have an allergy to CHG or antibacterial soaps. If your skin becomes reddened/irritated stop using the CHG.  Do not shave (including legs and underarms) for at least 48 hours prior to first CHG shower. It is OK to shave your face.  Please follow these instructions carefully.   1. Shower the NIGHT BEFORE SURGERY and the MORNING OF SURGERY with CHG Soap.   2. If you chose to wash your hair, wash your hair first as usual with  your normal shampoo.  3. After you shampoo, rinse your hair and body thoroughly to remove the shampoo.  4. Use CHG as you would any other liquid soap. You can apply CHG directly to the skin and wash gently with a scrungie or a clean washcloth.   5. Apply the CHG Soap to your body ONLY FROM THE NECK DOWN.  Do not use on open wounds or open sores. Avoid contact with your eyes, ears, mouth and genitals (private parts). Wash Face and genitals (private parts)  with your normal soap.   6. Wash thoroughly, paying special attention to the  area where your surgery will be performed.  7. Thoroughly rinse your body with warm water from the neck down.  8. DO NOT shower/wash with your normal soap after using and rinsing off the CHG Soap.  9. Pat yourself dry with a CLEAN TOWEL.  10. Wear CLEAN PAJAMAS to bed the night before surgery, wear comfortable clothes the morning of surgery  11. Place CLEAN SHEETS on your bed the night of your first shower and DO NOT SLEEP WITH PETS.    Day of Surgery:  Please shower the morning of surgery with the CHG soap Do not apply any deodorants/lotions. Please wear clean clothes to the hospital/surgery center.   Remember to brush your teeth WITH YOUR REGULAR TOOTHPASTE.   Please read over the following fact sheets that you were given.

## 2020-01-22 ENCOUNTER — Encounter (HOSPITAL_COMMUNITY): Payer: Self-pay

## 2020-01-22 ENCOUNTER — Ambulatory Visit (INDEPENDENT_AMBULATORY_CARE_PROVIDER_SITE_OTHER): Payer: Medicare Other | Admitting: Cardiothoracic Surgery

## 2020-01-22 ENCOUNTER — Other Ambulatory Visit: Payer: Self-pay

## 2020-01-22 ENCOUNTER — Encounter (HOSPITAL_COMMUNITY)
Admission: RE | Admit: 2020-01-22 | Discharge: 2020-01-22 | Disposition: A | Discharge status: Home / Self Care | Payer: Medicare Other | Source: Ambulatory Visit | Attending: Cardiothoracic Surgery | Admitting: Cardiothoracic Surgery

## 2020-01-22 ENCOUNTER — Other Ambulatory Visit (HOSPITAL_COMMUNITY)
Admission: RE | Admit: 2020-01-22 | Discharge: 2020-01-22 | Disposition: A | Discharge status: Home / Self Care | Payer: Medicare Other | Source: Ambulatory Visit | Attending: Cardiothoracic Surgery | Admitting: Cardiothoracic Surgery

## 2020-01-22 ENCOUNTER — Ambulatory Visit (HOSPITAL_COMMUNITY)
Admission: RE | Admit: 2020-01-22 | Discharge: 2020-01-22 | Disposition: A | Discharge status: Home / Self Care | Payer: Medicare Other | Source: Ambulatory Visit | Attending: Cardiothoracic Surgery | Admitting: Cardiothoracic Surgery

## 2020-01-22 VITALS — BP 128/60 | HR 56 | Temp 98.6°F | Resp 20 | Ht 68.0 in | Wt 209.0 lb

## 2020-01-22 DIAGNOSIS — R911 Solitary pulmonary nodule: Secondary | ICD-10-CM | POA: Insufficient documentation

## 2020-01-22 DIAGNOSIS — Z7982 Long term (current) use of aspirin: Secondary | ICD-10-CM | POA: Insufficient documentation

## 2020-01-22 DIAGNOSIS — Z79899 Other long term (current) drug therapy: Secondary | ICD-10-CM | POA: Insufficient documentation

## 2020-01-22 DIAGNOSIS — K2210 Ulcer of esophagus without bleeding: Secondary | ICD-10-CM | POA: Insufficient documentation

## 2020-01-22 DIAGNOSIS — I252 Old myocardial infarction: Secondary | ICD-10-CM | POA: Insufficient documentation

## 2020-01-22 DIAGNOSIS — Z7901 Long term (current) use of anticoagulants: Secondary | ICD-10-CM | POA: Insufficient documentation

## 2020-01-22 DIAGNOSIS — Z87891 Personal history of nicotine dependence: Secondary | ICD-10-CM | POA: Insufficient documentation

## 2020-01-22 DIAGNOSIS — K219 Gastro-esophageal reflux disease without esophagitis: Secondary | ICD-10-CM | POA: Insufficient documentation

## 2020-01-22 DIAGNOSIS — E785 Hyperlipidemia, unspecified: Secondary | ICD-10-CM | POA: Insufficient documentation

## 2020-01-22 DIAGNOSIS — Z20822 Contact with and (suspected) exposure to covid-19: Secondary | ICD-10-CM | POA: Insufficient documentation

## 2020-01-22 DIAGNOSIS — Z01818 Encounter for other preprocedural examination: Secondary | ICD-10-CM | POA: Diagnosis not present

## 2020-01-22 DIAGNOSIS — Z01812 Encounter for preprocedural laboratory examination: Secondary | ICD-10-CM | POA: Insufficient documentation

## 2020-01-22 DIAGNOSIS — I10 Essential (primary) hypertension: Secondary | ICD-10-CM | POA: Insufficient documentation

## 2020-01-22 DIAGNOSIS — I2510 Atherosclerotic heart disease of native coronary artery without angina pectoris: Secondary | ICD-10-CM | POA: Insufficient documentation

## 2020-01-22 HISTORY — PX: LUNG LOBECTOMY: SHX167

## 2020-01-22 LAB — URINALYSIS, ROUTINE W REFLEX MICROSCOPIC
Bilirubin Urine: NEGATIVE
Glucose, UA: NEGATIVE mg/dL
Hgb urine dipstick: NEGATIVE
Ketones, ur: NEGATIVE mg/dL
Leukocytes,Ua: NEGATIVE
Nitrite: NEGATIVE
Protein, ur: NEGATIVE mg/dL
Specific Gravity, Urine: 1.02 (ref 1.005–1.030)
pH: 5 (ref 5.0–8.0)

## 2020-01-22 LAB — COMPREHENSIVE METABOLIC PANEL
ALT: 22 U/L (ref 0–44)
AST: 17 U/L (ref 15–41)
Albumin: 3.8 g/dL (ref 3.5–5.0)
Alkaline Phosphatase: 54 U/L (ref 38–126)
Anion gap: 12 (ref 5–15)
BUN: 27 mg/dL — ABNORMAL HIGH (ref 8–23)
CO2: 20 mmol/L — ABNORMAL LOW (ref 22–32)
Calcium: 8.9 mg/dL (ref 8.9–10.3)
Chloride: 107 mmol/L (ref 98–111)
Creatinine, Ser: 0.87 mg/dL (ref 0.61–1.24)
GFR calc Af Amer: >60 mL/min (ref >60)
GFR calc non Af Amer: >60 mL/min (ref >60)
Glucose, Bld: 104 mg/dL — ABNORMAL HIGH (ref 70–99)
Potassium: 4.3 mmol/L (ref 3.5–5.1)
Sodium: 139 mmol/L (ref 135–145)
Total Bilirubin: 0.8 mg/dL (ref 0.3–1.2)
Total Protein: 6 g/dL — ABNORMAL LOW (ref 6.5–8.1)

## 2020-01-22 LAB — BLOOD GAS, ARTERIAL
Acid-base deficit: 1.1 mmol/L (ref 0.0–2.0)
Bicarbonate: 22.5 mmol/L (ref 20.0–28.0)
Drawn by: 421801
FIO2: 21
O2 Saturation: 94.5 %
Patient temperature: 37
pCO2 arterial: 33.5 mmHg (ref 32.0–48.0)
pH, Arterial: 7.442 (ref 7.350–7.450)
pO2, Arterial: 85.3 mmHg (ref 83.0–108.0)

## 2020-01-22 LAB — SURGICAL PCR SCREEN
MRSA, PCR: NEGATIVE
Staphylococcus aureus: NEGATIVE

## 2020-01-22 LAB — APTT: aPTT: 27 seconds (ref 24–36)

## 2020-01-22 LAB — CBC
HCT: 39.3 % (ref 39.0–52.0)
Hemoglobin: 12.3 g/dL — ABNORMAL LOW (ref 13.0–17.0)
MCH: 26.7 pg (ref 26.0–34.0)
MCHC: 31.3 g/dL (ref 30.0–36.0)
MCV: 85.2 fL (ref 80.0–100.0)
Platelets: 214 10*3/uL (ref 150–400)
RBC: 4.61 MIL/uL (ref 4.22–5.81)
RDW: 14.6 % (ref 11.5–15.5)
WBC: 7.6 10*3/uL (ref 4.0–10.5)
nRBC: 0 % (ref 0.0–0.2)

## 2020-01-22 LAB — PROTIME-INR
INR: 1 (ref 0.8–1.2)
Prothrombin Time: 13.3 seconds (ref 11.4–15.2)

## 2020-01-22 LAB — SARS CORONAVIRUS 2 (TAT 6-24 HRS): SARS Coronavirus 2: NEGATIVE

## 2020-01-22 LAB — ABO/RH: ABO/RH(D): A NEG

## 2020-01-22 NOTE — Progress Notes (Signed)
PCP:  Leanna Battles, MD Cardiologist:  Minus Breeding, MD  EKG:  01/12/20 CXR:  01/22/20 ECHO:  2014 Stress Test:  2014 Cardiac Cath:  1996 and patient believes in 2014  Covid test 01/22/20  Anethesia Review:  Yes, cardiac history with stent in 1996.  Patient denies shortness of breath, fever, cough, and chest pain at PAT appointment.  Patient verbalized understanding of instructions provided today at the PAT appointment.  Patient asked to review instructions at home and day of surgery.

## 2020-01-22 NOTE — Progress Notes (Signed)
[address]       [place] [address]             [phone]                    Eashan Faro [place] Medical Record #[id number] Date of Birth: 1946-12-04  Referring: Magdalen Spatz, NP Primary Care: Leanna Battles, MD Primary Cardiologist: No primary care provider on file.  Chief Complaint:    Chief Complaint  Patient presents with  . Lung Lesion    Surgery scheduled for 01/24/2020    History of Present Illness:    Normal Recinos 74 y.o. male is seen in the office  today  To further discuss planned surgical resection ofnew right upper lobe lung lesion.  Patient has a previous history of smoking for approximately 40 years both he and his wife quit smoking 9 years ago.  For the past 4 years he has been getting yearly CT lung cancer screening CTs, scan 1 year ago suggested a very small subpleural nodule in the right upper lobe, scan this year showed this is increased in size up to 1.4  cm.  Area is hypermetabolic on PET scan, there were NO associated enlarged mediastinal nodes were PET positive nodes.  The patient's previous history is also significant for known short segment Barrett's esophagus most recently evaluated with upper GI endoscopy in December 2020-biopsy showed no dysplasia.  He had a Billroth II done while living in [place]  Patient has known coronary occlusive disease, with stents placed in the right coronary artery in 1996 while living [place].  His recent CT scan showed very extensive coronary calcifications in the both right and left system.  He had cardiac catheterization done in 2014.  He has no definite angina, but does have subtle symptoms of some wheezing occasionally with exertion, he does note shortness of breath when walking up hills but continues to walk about 3 miles every other day in his neighborhood.  Since last seen the patient is return to see Dr. Percival Spanish for preop clearance.   Current Activity/ Functional Status:  Patient is  independent with mobility/ambulation, transfers, ADL's, IADL's.   Zubrod Score: At the time of surgery this patient's most appropriate activity status/level should be described as: [x]     0    Normal activity, no symptoms []     1    Restricted in physical strenuous activity but ambulatory, able to do out light work []     2    Ambulatory and capable of self care, unable to do work activities, up and about               >50 % of waking hours                              []     3    Only limited self care, in bed greater than 50% of waking hours []     4    Completely disabled, no self care, confined to bed or chair []     5    Moribund   Past Medical History:  Diagnosis Date  . Allergy   . Arthritis   . Barrett's esophagus   . CAD (coronary artery disease)    a. Stent to the [place] (in [place]);  b. ETT(11/13/13): abnormal with early ST changes to suggest ischemia;   c. LHC (11/17/13):  pLAD 30-40, D1 30, mLAD 70-75, mCFX 70, mRCA stent 40 (ISR), EF 55-65%.  Medical Rx  . Cataract    left eye removed, right immature  . Colon polyps   . Dyslipidemia   . GERD (gastroesophageal reflux disease)   . Hypertension   . Myocardial infarction ([place]) 1996  . Nephrolithiasis    kidney stone  . PUD (peptic ulcer disease)     Past Surgical History:  Procedure Laterality Date  . ANGIOPLASTY     stent - rt cor. art  . BILROTH II PROCEDURE    . COLONOSCOPY    . LEFT HEART CATHETERIZATION WITH CORONARY ANGIOGRAM N/A 11/17/2013   Procedure: LEFT HEART CATHETERIZATION WITH CORONARY ANGIOGRAM;  Surgeon: Blane Ohara, MD;  [facility] C[facility] LAB;  Service: Cardiovascular;  Laterality: N/A;  . POLYPECTOMY    . ulcer surgery    . ULNAR NERVE TRANSPOSITION Right 08/05/2017   Procedure: RIGHT ULNAR NERVE DECOMPRESSION;  Surgeon: Leanora Cover, MD;  Location: [place];  Service: Orthopedics;  Laterality: Right;    Family History  Problem Relation Age of Onset  . Sudden death Mother  33  . CAD Brother 58       CABG  . CAD Sister 73       CABG  . CAD Sister 24       CABG  . Cancer Brother   . Liver disease Other   . Colon cancer Neg Hx   . Colon polyps Neg Hx   . Esophageal cancer Neg Hx   . Rectal cancer Neg Hx   . Stomach cancer Neg Hx      Social History   Tobacco Use  Smoking Status Former Smoker  . Packs/day: 1.00  . Years: 42.00  . Pack years: 42.00  . Types: Cigarettes  . Quit date: 06/21/2011  . Years since quitting: 8.5  Smokeless Tobacco Current User  . Types: Chew    Social History   Substance and Sexual Activity  Alcohol Use Yes  . Alcohol/week: 0.0 standard drinks   Comment: social     No Known Allergies  Current Outpatient Medications  Medication Sig Dispense Refill  . acetaminophen (TYLENOL) 500 MG tablet Take 500-1,000 mg by mouth every 6 (six) hours as needed (for pain.).    Marland Kitchen aspirin EC 81 MG tablet Take 81 mg by mouth 3 (three) times a week. In the morning    . atorvastatin (LIPITOR) 40 MG tablet TAKE 1 TABLET DAILY (Patient taking differently: Take 40 mg by mouth daily. ) 90 tablet 3  . carvedilol (COREG) 12.5 MG tablet Take 12.5 mg by mouth daily.     . Coenzyme Q10 (CO Q-10) 100 MG CAPS Take 100 mg by mouth daily.     Marland Kitchen ezetimibe (ZETIA) 10 MG tablet Take 10 mg by mouth daily.    . fish oil-omega-3 fatty acids 1000 MG capsule Take 1,000 mg by mouth daily.     Marland Kitchen ipratropium (ATROVENT) 0.03 % nasal spray Place 1 spray into both nostrils daily.     . Multiple Vitamin (MULTIVITAMIN WITH MINERALS) TABS tablet Take 1 tablet by mouth daily.    . nitroGLYCERIN (NITROLINGUAL) 0.4 MG/SPRAY spray USE 1 SPRAY ON OR UNDER THE TONGUE EVERY 5 MINUTES FOR 3 DOSES AS NEEDED FOR CHEST PAIN AS DIRECTED BY PRESCRIBER OR PACKAGE INSTRUCTIONS (Patient taking differently: Place 1 spray under the tongue every 5 (five) minutes x 3 doses as needed for chest pain. ) 24  g 3  . omeprazole (PRILOSEC) 10 MG capsule Take 10 mg by mouth daily.    Marland Kitchen  oxybutynin (DITROPAN) 5 MG tablet Take 5 mg by mouth daily.     Marland Kitchen telmisartan-hydrochlorothiazide (MICARDIS HCT) 80-12.5 MG tablet Take 1 tablet by mouth daily. 90 tablet 3   No current facility-administered medications for this visit.    Pertinent items are noted in HPI.   Review of Systems:     Cardiac Review of Systems: [Y] = yes  or   [ N ] = no   Chest Pain [  Y  ]  Resting SOB Aqua.Slicker  ] Exertional SOB  [Y  ]  Vertell Limber Aqua.Slicker  ]   Pedal Edema [ N ]    Palpitations [ N] Syncope  [ N]   Presyncope Aqua.Slicker  ]   General Review of Systems: [Y] = yes [  ]=no Constitional: recent weight change [  ];  Wt loss over the last 3 months [   ] anorexia [  ]; fatigue [  ]; nausea [  ]; night sweats [  ]; fever [  ]; or chills [  ];           Eye : blurred vision [  ]; diplopia [   ]; vision changes [  ];  Amaurosis fugax[  ]; Resp: cough [  ];  wheezing[y  ];  hemoptysis[  ]; shortness of breath[  ]; paroxysmal nocturnal dyspnea[  ]; dyspnea on exertion[y  ]; or orthopnea[  ];  GI:  gallstones[  ], vomiting[  ];  dysphagia[  ]; melena[  ];  hematochezia [  ]; heartburn[  ];   Hx of  Colonoscopy[y dec 2020  ]; GU: kidney stones [ y ]; hematuria[  ];   dysuria [  ];  nocturia[  ];  history of     obstruction [  ]; urinary frequency [  ]             Skin: rash, swelling[  ];, hair loss[  ];  peripheral edema[  ];  or itching[  ]; Musculosketetal: myalgias[  ];  joint swelling[ y ];  joint erythema[  ];  joint pain[ y ];  back pain[  ];  Heme/Lymph: bruising[  ];  bleeding[  ];  anemia[  ];  Neuro: TIA[  ];  headaches[  ];  stroke[  ];  vertigo[  ];  seizures[  ];   paresthesias[  ];  difficulty walking[  ];  Psych:depression[  ]; anxiety[  ];  Endocrine: diabetes[  ];  thyroid dysfunction[  ];  Immunizations: Flu up to date Jazmín.Cullens  ]; Pneumococcal up to date [ Y];  Other:  Recent EGD and colonoscopy: Diagnosis 1. Surgical [P], esophagus BX @ 41cm - INTESTINAL METAPLASIA CONSISTENT WITH BARRETT'S ESOPHAGUS. - NO  DYSPLASIA OR MALIGNANCY. 2. Surgical [P], esophagus BX @ 39cm - INTESTINAL METAPLASIA CONSISTENT WITH BARRETT'S ESOPHAGUS. - NO DYSPLASIA OR MALIGNANCY. 3. Surgical [P], colon, ascending, hepatic flexure, polyp (3) - TUBULAR ADENOMA (X3 FRAGMENTS). - NO HIGH GRADE DYSPLASIA OR MALIGNANCY. JULIA MANNY MD   PHYSICAL EXAMINATION: BP 128/60   Pulse (!) 56   Temp 98.6 F (37 C) (Oral)   Resp 20   Ht 5\' 8"  (1.727 m)   Wt 209 lb (94.8 kg)   SpO2 97% Comment: RA  BMI 31.78 kg/m  General appearance: alert, cooperative and no distress Head: Normocephalic, without obvious abnormality, atraumatic Neck: no adenopathy, no  carotid bruit, no JVD, supple, symmetrical, trachea midline and thyroid not enlarged, symmetric, no tenderness/mass/nodules Lymph nodes: Cervical, supraclavicular, and axillary nodes normal. Resp: clear to auscultation bilaterally Cardio: regular rate and rhythm, S1, S2 normal, no murmur, click, rub or gallop GI: soft, non-tender; bowel sounds normal; no masses,  no organomegaly Extremities: extremities normal, atraumatic, no cyanosis or edema and Homans sign is negative, no sign of DVT Neurologic: Grossly normal  Diagnostic Studies & Laboratory data:     Recent Radiology Findings:   NM PET Image Initial (PI) Skull Base To Thigh  Result Date: 01/05/2020 CLINICAL DATA:  Initial treatment strategy for new 1.6 cm nodule in the posterior right upper lobe on screening CT. EXAM: NUCLEAR MEDICINE PET SKULL BASE TO THIGH TECHNIQUE: 11.4 mCi F-18 FDG was injected intravenously. Full-ring PET imaging was performed from the skull base to thigh after the radiotracer. CT data was obtained and used for attenuation correction and anatomic localization. Fasting blood glucose: 92 mg/dl COMPARISON:  Screening CT 12/27/2019. FINDINGS: Mediastinal blood pool activity: SUV max 2.2 Liver activity: SUV max NA NECK: No areas of abnormal hypermetabolism. Incidental CT findings: No cervical adenopathy.  Left maxillary sinus mucosal thickening. CHEST: No cervical nodal hypermetabolism. Hypermetabolism corresponding to the perifissural right upper lobe pulmonary nodule. 1.4 cm and a S.U.V. max of 9.2 on 105/3. Incidental CT findings: Deferred to recent screening CT. Aortic and coronary artery atherosclerosis. Centrilobular emphysema. ABDOMEN/PELVIS: No abdominopelvic parenchymal or nodal hypermetabolism. Incidental CT findings: Left adrenal thickening. Right adrenal 1.6 cm adenoma. High left hepatic lobe subcentimeter cyst. Surgical changes of gastrojejunostomy. Mild prostatomegaly. Small fat containing right inguinal hernia. SKELETON: No abnormal marrow activity. Incidental CT findings: Degenerative changes of both hips. IMPRESSION: 1. Hypermetabolic posterior right upper lobe lung nodule, consistent with primary bronchogenic carcinoma. 2. No thoracic nodal or extrathoracic hypermetabolic metastasis. 3. Presuming non-small-cell histology, T1bN0M0 or stage IA. 4. Aortic atherosclerosis (ICD10-I70.0), coronary artery atherosclerosis and emphysema (ICD10-J43.9). 5. Right adrenal adenoma. Electronically Signed   By: Abigail Miyamoto M.D.   On: 01/05/2020 16:24   CT CHEST LUNG CA SCREEN LOW DOSE W/O CM  Result Date: 12/27/2019 CLINICAL DATA:  Former smoker, quit 8 years ago, 40 pack-year history. EXAM: CT CHEST WITHOUT CONTRAST LOW-DOSE FOR LUNG CANCER SCREENING TECHNIQUE: Multidetector CT imaging of the chest was performed following the standard protocol without IV contrast. COMPARISON:  12/19/2018. FINDINGS: Cardiovascular: Atherosclerotic calcification of the aorta and coronary arteries. Heart size normal. No pericardial effusion. Mediastinum/Nodes: No pathologically enlarged mediastinal or axillary lymph nodes. Hilar regions are difficult to definitively evaluate without IV contrast but appear grossly unremarkable. Esophagus is grossly unremarkable. Lungs/Pleura: Mild centrilobular emphysema. Calcified granulomas.  There is a new 15.7 mm nodule in the peripheral aspect of the posterior segment right upper lobe (9/120). Other noncalcified pulmonary nodules measure 3.9 mm or less in size. No pleural fluid. Airway is unremarkable. Upper Abdomen: Subcentimeter low-attenuation lesion in the dome of liver is too small to characterize. Fluid density nodule in the right adrenal gland measures 2.1 cm. Visualized portions of the left adrenal gland, kidneys, spleen and pancreas are otherwise unremarkable. Postoperative changes are seen in the distal stomach. No upper abdominal adenopathy. Musculoskeletal: Degenerative changes in the spine. No worrisome lytic or sclerotic lesions. IMPRESSION: 1. New 15.7 mm nodule in the posterior segment right upper lobe. Lung-RADS 4B, suspicious. Additional imaging evaluation or consultation with Pulmonology or Thoracic Surgery recommended. These results will be called to the ordering clinician or representative by the Radiologist Assistant, and  communication documented in the PACS or zVision Dashboard. 2. Right adrenal adenoma. 3. Aortic atherosclerosis (ICD10-I70.0). Coronary artery calcification. 4.  Emphysema (ICD10-J43.9). Electronically Signed   By: Lorin Picket M.D.   On: 12/27/2019 13:10     I have independently reviewed the above radiology studies  and reviewed the findings with the patient.   Recent Lab Findings: Lab Results  Component Value Date   WBC 7.6 01/22/2020   HGB 12.3 (L) 01/22/2020   HCT 39.3 01/22/2020   PLT 214 01/22/2020   GLUCOSE 104 (H) 01/22/2020   ALT 22 01/22/2020   AST 17 01/22/2020   NA 139 01/22/2020   K 4.3 01/22/2020   CL 107 01/22/2020   CREATININE 0.87 01/22/2020   BUN 27 (H) 01/22/2020   CO2 20 (L) 01/22/2020   INR 1.0 01/22/2020   PFT's 01/09/2020 FEV1     2.58    83% DLCO   18.01  72%  Assessment / Plan:   #1 probable clinical stage Ia, T1N0M0 non-small cell lung cancer right upper lobe peripherally located #2 history of Billroth II  in the setting of short segment Barrett's esophagus-followed for dysplasia most recent endoscopy December 2020 #3 history of coronary artery disease with extensive calcifications noted on CT, history of stent placement right coronary artery 1996-vague symptoms of exertionally related shortness of breath, he denies outright chest pain.  I reviewed with the patient and his wife the likely diagnosis considering the radiographic findings on CT and PET.  The most direct approach in his treatment would be surgical resection of the right upper lobe lung mass and possible right upper lobectomy.  PFTs are adequate to tolerate resection.   Patient was reevaluated by cardiology last week and obtained cardiac clearance.    Discussed with patient and his wife the planned procedure of right chest robotic surgery, including biopsy and possible right upper lobectomy with node dissection.  Planned incisions, postop drainage tubes, typical postop course and recovery were all reviewed. The goals risks and alternatives of the planned surgical procedure Robotic approach to right chest with bronchoscopy right lung resection lymph node dissection have been discussed with the patient in detail. The risks of the procedure including death, infection, stroke, myocardial infarction, bleeding, blood transfusion have all been discussed specifically.  I have quoted Orrin Brigham a 2 % of perioperative mortality and a complication rate as high as 40 %. The patient's questions have been answered.Mitul Hallowell is willing  to proceed with the planned procedure.   Grace Isaac MD      [address] [place] [address] Office [phone]     01/22/2020 5:32 PM

## 2020-01-23 NOTE — Progress Notes (Signed)
Anesthesia Chart Review:  Case: [id number] Date/Time: 01/24/20 0815   Procedures:      VIDEO BRONCHOSCOPY (N/A )     XI ROBOTIC ASSISTED THORASCOPY-LUNG RESECTION, Possible RIGHT UPPER LOBECTOMY (Right Chest)     LYMPH NODE DISSECTION (N/A )   Anesthesia type: General   Pre-op diagnosis: PULMONARY NODULE   Location: [facility] OR ROOM 10 / [place] OR   Surgeons: Grace Isaac, MD      DISCUSSION: Patient is a 74 year old male scheduled for the above procedure.  History includes former smoker (quit 06/21/11), CAD ([place], s/p RCA stent 1996, [place]), HTN, dyslipidemia, GERD, PUD/Barrett's esophagus (s/p Billroth II gastrojejunostomy, [place]). BMI is consistent with obesity.   Seen by cardiologist Dr. Percival Spanish on 01/12/20 for pre-operative evaluation. He wrote, "PREOP:   The patient has no high risk findings or symptoms.  He is a high functional level.  Given this and according to ACC/AHA guidelines the patient is at acceptable risk for the planned surgery."   01/22/20 COVID-19 test negative.  Anesthesia team to evaluate on the day of surgery.   VS: BP 126/60   Pulse 60   Temp 37.1 C (Oral)   Resp 18   Ht 5' 8.75" (1.746 m)   Wt 95 kg   SpO2 95%   BMI 31.16 kg/m     PROVIDERS: Leanna Battles, MD his PCP Minus Breeding, MD is cardiologist - He had EGD and colonoscopy by Zenovia Jarred, MD on 12/04/19. Full reports are under Procedures tab.   LABS: Labs reviewed: Acceptable for surgery. (all labs ordered are listed, but only abnormal results are displayed)  Labs Reviewed  CBC - Abnormal; Notable for the following components:      Result Value   Hemoglobin 12.3 (*)    All other components within normal limits  COMPREHENSIVE METABOLIC PANEL - Abnormal; Notable for the following components:   CO2 20 (*)    Glucose, Bld 104 (*)    BUN 27 (*)    Total Protein 6.0 (*)    All other components within normal limits  SURGICAL PCR SCREEN  APTT  BLOOD GAS, ARTERIAL  PROTIME-INR  URINALYSIS, ROUTINE W  REFLEX MICROSCOPIC  TYPE AND SCREEN  ABO/RH    PFTs 01/09/20: FVC 3.78 (89%), post 4.11 (97%) FEV1 2.58  (83%), post 2.86 (92%) DLCO unc 18.01 (72%)   IMAGES: CXR 01/22/20: IMPRESSION: 1. 1.4 cm ill-defined right upper lobe lung nodule which corresponds to the noncalcified lung nodule seen within this region on the prior chest CT dated December 27, 2019.  PET Scan 01/05/20: IMPRESSION: 1. Hypermetabolic posterior right upper lobe lung nodule, consistent with primary bronchogenic carcinoma. 2. No thoracic nodal or extrathoracic hypermetabolic metastasis. 3. Presuming non-small-cell histology, T1bN0M0 or stage IA. 4. Aortic atherosclerosis (ICD10-I70.0), coronary artery atherosclerosis and emphysema (ICD10-J43.9). 5. Right adrenal adenoma.  CT chest (lung cancer screen) 12/27/19: IMPRESSION: 1. New 15.7 mm nodule in the posterior segment right upper lobe. Lung-RADS 4B, suspicious. Additional imaging evaluation or consultation with Pulmonology or Thoracic Surgery recommended. These results will be called to the ordering clinician or representative by the Radiologist Assistant, and communication documented in the PACS or zVision Dashboard. 2. Right adrenal adenoma. 3. Aortic atherosclerosis (ICD10-I70.0). Coronary artery calcification. 4.  Emphysema (ICD10-J43.9).    EKG: 01/12/20: NSR, non-specific ST abnormality   CV: Cardiac cath 11/17/13 Burt Knack, Legrand Como, MD; done following early ST changes on 11/13/13 ETT): Coronary angiography: - Coronary dominance: right - Left mainstem: No significant obstruction.  - Left anterior  descending (LAD): Proximal vessel has 30-40% stenosis. The first diagonal is patent with mild nonobstructive stenosis no greater than 30%. The mid-LAD has moderately severe 70-75% stenosis. - Left circumflex (LCx): Large in caliber. Medium caliber OM1 without significant stenosis. The mid-distal LCx has 70% stenosis and it supplies a large OM2 branch. -  Right coronary artery (RCA): Dominant vessel. The stented segment in the mid-vessel is patent with 40% restenosis. Otherwise the vessel has diffuse irregularity without significant stenosis.  - Left ventriculography: Left ventricular systolic function is normal, LVEF is estimated at 55-65%, there is no significant mitral regurgitation  Final Conclusions:   1. Moderate diffuse CAD with borderline lesions in the mid-LAD and mid-LCx and continued patency of the RCA stent with mild in-stent restenosis.  2. Normal LV function Recommendations: The patient has lesions in the mid-LAD and mid-LCx which may be hemodynamically significant. However, they are clearly not critically stenosed. Considering this patient's asymptomatic status at a normal functional capacity, I would favor ongoing medical therapy.    Past Medical History:  Diagnosis Date  . Allergy   . Arthritis   . Barrett's esophagus   . CAD (coronary artery disease)    a. Stent to the [place] (in [place]);  b. ETT(11/13/13): abnormal with early ST changes to suggest ischemia;   c. LHC (11/17/13):  pLAD 30-40, D1 30, mLAD 70-75, mCFX 70, mRCA stent 40 (ISR), EF 55-65%.  Medical Rx  . Cataract    left eye removed, right immature  . Colon polyps   . Dyslipidemia   . GERD (gastroesophageal reflux disease)   . Hypertension   . Myocardial infarction ([place]) 1996  . Nephrolithiasis    kidney stone  . PUD (peptic ulcer disease)     Past Surgical History:  Procedure Laterality Date  . ANGIOPLASTY     stent - rt cor. art  . BILROTH II PROCEDURE    . COLONOSCOPY    . LEFT HEART CATHETERIZATION WITH CORONARY ANGIOGRAM N/A 11/17/2013   Procedure: LEFT HEART CATHETERIZATION WITH CORONARY ANGIOGRAM;  Surgeon: Blane Ohara, MD;  Location[facility] CATH LAB;  Service: Cardiovascular;  Laterality: N/A;  . POLYPECTOMY    . ulcer surgery    . ULNAR NERVE TRANSPOSITION Right 08/05/2017   Procedure: RIGHT ULNAR NERVE DECOMPRESSION;  Surgeon: Leanora Cover,  MD;  Location: [place];  Service: Orthopedics;  Laterality: Right;    MEDICATIONS: . acetaminophen (TYLENOL) 500 MG tablet  . aspirin EC 81 MG tablet  . atorvastatin (LIPITOR) 40 MG tablet  . carvedilol (COREG) 12.5 MG tablet  . Coenzyme Q10 (CO Q-10) 100 MG CAPS  . ezetimibe (ZETIA) 10 MG tablet  . fish oil-omega-3 fatty acids 1000 MG capsule  . ipratropium (ATROVENT) 0.03 % nasal spray  . Multiple Vitamin (MULTIVITAMIN WITH MINERALS) TABS tablet  . nitroGLYCERIN (NITROLINGUAL) 0.4 MG/SPRAY spray  . omeprazole (PRILOSEC) 10 MG capsule  . oxybutynin (DITROPAN) 5 MG tablet  . telmisartan-hydrochlorothiazide (MICARDIS HCT) 80-12.5 MG tablet   No current facility-administered medications for this encounter.    Myra Gianotti, PA-C Surgical Short Stay/Anesthesiology [facility] Phone [phone] [facility] Phone [phone] 01/23/2020 12:40 PM

## 2020-01-23 NOTE — Anesthesia Preprocedure Evaluation (Addendum)
Anesthesia Evaluation  Patient identified by MRN, date of birth, ID band Patient awake    Reviewed: Allergy & Precautions, NPO status , Patient's Chart, lab work & pertinent test results, reviewed documented beta blocker date and time   Airway Mallampati: III  TM Distance: >3 FB Neck ROM: Full    Dental no notable dental hx.    Pulmonary former smoker,    Pulmonary exam normal breath sounds clear to auscultation       Cardiovascular hypertension, Pt. on home beta blockers and Pt. on medications + CAD, + Past MI and + Cardiac Stents (x 1)  Normal cardiovascular exam Rhythm:Regular Rate:Normal  ECG: NSR, rate 62  Seen by cardiologist Dr. Percival Spanish on 01/12/20 for pre-operative evaluation   Neuro/Psych negative neurological ROS  negative psych ROS   GI/Hepatic Neg liver ROS, PUD, GERD  Medicated and Controlled,  Endo/Other  negative endocrine ROS  Renal/GU negative Renal ROS     Musculoskeletal negative musculoskeletal ROS (+)   Abdominal (+) + obese,   Peds  Hematology HLD   Anesthesia Other Findings PULMONARY NODULE  Reproductive/Obstetrics                            Anesthesia Physical Anesthesia Plan  ASA: III  Anesthesia Plan: General   Post-op Pain Management:    Induction: Intravenous  PONV Risk Score and Plan: 2 and Ondansetron, Dexamethasone, Midazolam and Treatment may vary due to age or medical condition  Airway Management Planned: Oral ETT and Double Lumen EBT  Additional Equipment: Arterial line, CVP and Ultrasound Guidance Line Placement  Intra-op Plan:   Post-operative Plan: Extubation in OR  Informed Consent: I have reviewed the patients History and Physical, chart, labs and discussed the procedure including the risks, benefits and alternatives for the proposed anesthesia with the patient or authorized representative who has indicated his/her understanding and  acceptance.     Dental advisory given  Plan Discussed with: CRNA  Anesthesia Plan Comments: (Reviewed PAT note written 01/23/2020 by Myra Gianotti, PA-C. )      Anesthesia Quick Evaluation

## 2020-01-24 ENCOUNTER — Encounter (HOSPITAL_COMMUNITY): Payer: Self-pay | Admitting: Cardiothoracic Surgery

## 2020-01-24 ENCOUNTER — Inpatient Hospital Stay (HOSPITAL_COMMUNITY): Payer: Medicare Other | Admitting: Anesthesiology

## 2020-01-24 ENCOUNTER — Inpatient Hospital Stay (HOSPITAL_COMMUNITY): Payer: Medicare Other | Admitting: Vascular Surgery

## 2020-01-24 ENCOUNTER — Encounter (HOSPITAL_COMMUNITY)
Admission: RE | Disposition: A | Discharge status: Home / Self Care | Payer: Self-pay | Source: Ambulatory Visit | Attending: Cardiothoracic Surgery

## 2020-01-24 ENCOUNTER — Inpatient Hospital Stay (HOSPITAL_COMMUNITY)
Admission: RE | Admit: 2020-01-24 | Discharge: 2020-02-01 | DRG: 164 | Disposition: A | Discharge status: Home / Self Care | Payer: Medicare Other | Attending: Cardiothoracic Surgery | Admitting: Cardiothoracic Surgery

## 2020-01-24 ENCOUNTER — Inpatient Hospital Stay (HOSPITAL_COMMUNITY): Payer: Medicare Other

## 2020-01-24 ENCOUNTER — Other Ambulatory Visit: Payer: Self-pay

## 2020-01-24 DIAGNOSIS — Z8711 Personal history of peptic ulcer disease: Secondary | ICD-10-CM

## 2020-01-24 DIAGNOSIS — J939 Pneumothorax, unspecified: Secondary | ICD-10-CM

## 2020-01-24 DIAGNOSIS — R911 Solitary pulmonary nodule: Secondary | ICD-10-CM | POA: Diagnosis not present

## 2020-01-24 DIAGNOSIS — I252 Old myocardial infarction: Secondary | ICD-10-CM | POA: Diagnosis not present

## 2020-01-24 DIAGNOSIS — J9382 Other air leak: Secondary | ICD-10-CM | POA: Diagnosis not present

## 2020-01-24 DIAGNOSIS — Z902 Acquired absence of lung [part of]: Secondary | ICD-10-CM

## 2020-01-24 DIAGNOSIS — Z955 Presence of coronary angioplasty implant and graft: Secondary | ICD-10-CM | POA: Diagnosis not present

## 2020-01-24 DIAGNOSIS — K219 Gastro-esophageal reflux disease without esophagitis: Secondary | ICD-10-CM | POA: Diagnosis present

## 2020-01-24 DIAGNOSIS — J984 Other disorders of lung: Secondary | ICD-10-CM | POA: Diagnosis present

## 2020-01-24 DIAGNOSIS — Z87891 Personal history of nicotine dependence: Secondary | ICD-10-CM | POA: Diagnosis not present

## 2020-01-24 DIAGNOSIS — Z4682 Encounter for fitting and adjustment of non-vascular catheter: Secondary | ICD-10-CM | POA: Diagnosis not present

## 2020-01-24 DIAGNOSIS — Z87442 Personal history of urinary calculi: Secondary | ICD-10-CM | POA: Diagnosis not present

## 2020-01-24 DIAGNOSIS — J948 Other specified pleural conditions: Secondary | ICD-10-CM | POA: Diagnosis not present

## 2020-01-24 DIAGNOSIS — C3411 Malignant neoplasm of upper lobe, right bronchus or lung: Secondary | ICD-10-CM | POA: Diagnosis not present

## 2020-01-24 DIAGNOSIS — J90 Pleural effusion, not elsewhere classified: Secondary | ICD-10-CM | POA: Diagnosis not present

## 2020-01-24 DIAGNOSIS — C3490 Malignant neoplasm of unspecified part of unspecified bronchus or lung: Secondary | ICD-10-CM | POA: Diagnosis present

## 2020-01-24 DIAGNOSIS — Z8719 Personal history of other diseases of the digestive system: Secondary | ICD-10-CM | POA: Diagnosis not present

## 2020-01-24 DIAGNOSIS — R918 Other nonspecific abnormal finding of lung field: Secondary | ICD-10-CM | POA: Diagnosis not present

## 2020-01-24 DIAGNOSIS — Z6833 Body mass index (BMI) 33.0-33.9, adult: Secondary | ICD-10-CM

## 2020-01-24 DIAGNOSIS — I440 Atrioventricular block, first degree: Secondary | ICD-10-CM | POA: Diagnosis present

## 2020-01-24 DIAGNOSIS — Z20822 Contact with and (suspected) exposure to covid-19: Secondary | ICD-10-CM | POA: Diagnosis present

## 2020-01-24 DIAGNOSIS — I898 Other specified noninfective disorders of lymphatic vessels and lymph nodes: Secondary | ICD-10-CM | POA: Diagnosis not present

## 2020-01-24 DIAGNOSIS — Z09 Encounter for follow-up examination after completed treatment for conditions other than malignant neoplasm: Secondary | ICD-10-CM

## 2020-01-24 DIAGNOSIS — E785 Hyperlipidemia, unspecified: Secondary | ICD-10-CM | POA: Diagnosis present

## 2020-01-24 DIAGNOSIS — I10 Essential (primary) hypertension: Secondary | ICD-10-CM | POA: Diagnosis present

## 2020-01-24 DIAGNOSIS — C771 Secondary and unspecified malignant neoplasm of intrathoracic lymph nodes: Secondary | ICD-10-CM | POA: Diagnosis not present

## 2020-01-24 DIAGNOSIS — I2510 Atherosclerotic heart disease of native coronary artery without angina pectoris: Secondary | ICD-10-CM | POA: Diagnosis present

## 2020-01-24 DIAGNOSIS — M1990 Unspecified osteoarthritis, unspecified site: Secondary | ICD-10-CM | POA: Diagnosis present

## 2020-01-24 DIAGNOSIS — D62 Acute posthemorrhagic anemia: Secondary | ICD-10-CM | POA: Diagnosis not present

## 2020-01-24 DIAGNOSIS — E669 Obesity, unspecified: Secondary | ICD-10-CM | POA: Diagnosis present

## 2020-01-24 DIAGNOSIS — R0602 Shortness of breath: Secondary | ICD-10-CM | POA: Diagnosis not present

## 2020-01-24 DIAGNOSIS — Z9689 Presence of other specified functional implants: Secondary | ICD-10-CM

## 2020-01-24 HISTORY — PX: VIDEO BRONCHOSCOPY: SHX5072

## 2020-01-24 HISTORY — PX: LYMPH NODE DISSECTION: SHX5087

## 2020-01-24 HISTORY — PX: INTERCOSTAL NERVE BLOCK: SHX5021

## 2020-01-24 LAB — POCT I-STAT 7, (LYTES, BLD GAS, ICA,H+H)
Acid-Base Excess: 1 mmol/L (ref 0.0–2.0)
Acid-base deficit: 2 mmol/L (ref 0.0–2.0)
Acid-base deficit: 3 mmol/L — ABNORMAL HIGH (ref 0.0–2.0)
Acid-base deficit: 1 mmol/L (ref 0.0–2.0)
Bicarbonate: 27 mmol/L (ref 20.0–28.0)
Bicarbonate: 27.7 mmol/L (ref 20.0–28.0)
Bicarbonate: 28.3 mmol/L — ABNORMAL HIGH (ref 20.0–28.0)
Bicarbonate: 27.8 mmol/L (ref 20.0–28.0)
Bicarbonate: 28.6 mmol/L — ABNORMAL HIGH (ref 20.0–28.0)
Bicarbonate: 24.1 mmol/L (ref 20.0–28.0)
Calcium, Ion: 1.12 mmol/L — ABNORMAL LOW (ref 1.15–1.40)
Calcium, Ion: 1.18 mmol/L (ref 1.15–1.40)
Calcium, Ion: 1.19 mmol/L (ref 1.15–1.40)
Calcium, Ion: 1.18 mmol/L (ref 1.15–1.40)
Calcium, Ion: 1.15 mmol/L (ref 1.15–1.40)
Calcium, Ion: 1.14 mmol/L — ABNORMAL LOW (ref 1.15–1.40)
HCT: 31 % — ABNORMAL LOW (ref 39.0–52.0)
HCT: 32 % — ABNORMAL LOW (ref 39.0–52.0)
HCT: 31 % — ABNORMAL LOW (ref 39.0–52.0)
HCT: 31 % — ABNORMAL LOW (ref 39.0–52.0)
HCT: 32 % — ABNORMAL LOW (ref 39.0–52.0)
HCT: 32 % — ABNORMAL LOW (ref 39.0–52.0)
Hemoglobin: 10.5 g/dL — ABNORMAL LOW (ref 13.0–17.0)
Hemoglobin: 10.9 g/dL — ABNORMAL LOW (ref 13.0–17.0)
Hemoglobin: 10.5 g/dL — ABNORMAL LOW (ref 13.0–17.0)
Hemoglobin: 10.5 g/dL — ABNORMAL LOW (ref 13.0–17.0)
Hemoglobin: 10.9 g/dL — ABNORMAL LOW (ref 13.0–17.0)
Hemoglobin: 10.9 g/dL — ABNORMAL LOW (ref 13.0–17.0)
O2 Saturation: 99 %
O2 Saturation: 99 %
O2 Saturation: 98 %
O2 Saturation: 98 %
O2 Saturation: 100 %
O2 Saturation: 94 %
Patient temperature: 36.5
Patient temperature: 98.2
Potassium: 5.6 mmol/L — ABNORMAL HIGH (ref 3.5–5.1)
Potassium: 6.2 mmol/L — ABNORMAL HIGH (ref 3.5–5.1)
Potassium: 5.6 mmol/L — ABNORMAL HIGH (ref 3.5–5.1)
Potassium: 5.6 mmol/L — ABNORMAL HIGH (ref 3.5–5.1)
Potassium: 4.9 mmol/L (ref 3.5–5.1)
Potassium: 4.4 mmol/L (ref 3.5–5.1)
Sodium: 136 mmol/L (ref 135–145)
Sodium: 137 mmol/L (ref 135–145)
Sodium: 136 mmol/L (ref 135–145)
Sodium: 136 mmol/L (ref 135–145)
Sodium: 137 mmol/L (ref 135–145)
Sodium: 138 mmol/L (ref 135–145)
TCO2: 29 mmol/L (ref 22–32)
TCO2: 30 mmol/L (ref 22–32)
TCO2: 30 mmol/L (ref 22–32)
TCO2: 30 mmol/L (ref 22–32)
TCO2: 30 mmol/L (ref 22–32)
TCO2: 25 mmol/L (ref 22–32)
pCO2 arterial: 73.7 mmHg (ref 32.0–48.0)
pCO2 arterial: 73.8 mmHg (ref 32.0–48.0)
pCO2 arterial: 67.3 mmHg (ref 32.0–48.0)
pCO2 arterial: 62.6 mmHg — ABNORMAL HIGH (ref 32.0–48.0)
pCO2 arterial: 55.8 mmHg — ABNORMAL HIGH (ref 32.0–48.0)
pCO2 arterial: 41.5 mmHg (ref 32.0–48.0)
pH, Arterial: 7.172 — CL (ref 7.350–7.450)
pH, Arterial: 7.182 — CL (ref 7.350–7.450)
pH, Arterial: 7.232 — ABNORMAL LOW (ref 7.350–7.450)
pH, Arterial: 7.255 — ABNORMAL LOW (ref 7.350–7.450)
pH, Arterial: 7.315 — ABNORMAL LOW (ref 7.350–7.450)
pH, Arterial: 7.371 (ref 7.350–7.450)
pO2, Arterial: 148 mmHg — ABNORMAL HIGH (ref 83.0–108.0)
pO2, Arterial: 153 mmHg — ABNORMAL HIGH (ref 83.0–108.0)
pO2, Arterial: 121 mmHg — ABNORMAL HIGH (ref 83.0–108.0)
pO2, Arterial: 131 mmHg — ABNORMAL HIGH (ref 83.0–108.0)
pO2, Arterial: 453 mmHg — ABNORMAL HIGH (ref 83.0–108.0)
pO2, Arterial: 73 mmHg — ABNORMAL LOW (ref 83.0–108.0)

## 2020-01-24 LAB — PREPARE RBC (CROSSMATCH)

## 2020-01-24 LAB — MRSA PCR SCREENING: MRSA by PCR: NEGATIVE

## 2020-01-24 SURGERY — BRONCHOSCOPY, VIDEO-ASSISTED
Anesthesia: General | Site: Chest | Laterality: Right

## 2020-01-24 MED ORDER — ONDANSETRON HCL 4 MG/2ML IJ SOLN: 4.0000 mg | Freq: Four times a day (QID) | INTRAMUSCULAR | Status: DC | PRN

## 2020-01-24 MED ORDER — CEFAZOLIN SODIUM-DEXTROSE 2-4 GM/100ML-% IV SOLN
2.0000 g | Freq: Three times a day (TID) | INTRAVENOUS | Status: AC
  Administered 2020-01-24 – 2020-01-25 (×2): 2 g via INTRAVENOUS
  Filled 2020-01-24 (×2): qty 100

## 2020-01-24 MED ORDER — SENNOSIDES-DOCUSATE SODIUM 8.6-50 MG PO TABS
1.0000 | ORAL_TABLET | Freq: Every day | ORAL | Status: DC
  Administered 2020-01-24 – 2020-01-31 (×4): 1 via ORAL
  Filled 2020-01-24 (×6): qty 1

## 2020-01-24 MED ORDER — SODIUM CHLORIDE 0.9 % IV SOLN: INTRAVENOUS | Status: DC | PRN

## 2020-01-24 MED ORDER — LIDOCAINE 2% (20 MG/ML) 5 ML SYRINGE
INTRAMUSCULAR | Status: DC | PRN
  Administered 2020-01-24: 60 mg via INTRAVENOUS

## 2020-01-24 MED ORDER — EZETIMIBE 10 MG PO TABS
10.0000 mg | ORAL_TABLET | Freq: Every day | ORAL | Status: DC
  Administered 2020-01-25 – 2020-02-01 (×8): 10 mg via ORAL
  Filled 2020-01-24 (×8): qty 1

## 2020-01-24 MED ORDER — FENTANYL CITRATE (PF) 100 MCG/2ML IJ SOLN: 25.0000 ug | INTRAMUSCULAR | Status: DC | PRN

## 2020-01-24 MED ORDER — DIPHENHYDRAMINE HCL 12.5 MG/5ML PO ELIX: 12.5000 mg | ORAL_SOLUTION | Freq: Four times a day (QID) | ORAL | Status: DC | PRN

## 2020-01-24 MED ORDER — SODIUM CHLORIDE 0.9% IV SOLUTION: Freq: Once | INTRAVENOUS | Status: DC

## 2020-01-24 MED ORDER — DEXAMETHASONE SODIUM PHOSPHATE 4 MG/ML IJ SOLN
INTRAMUSCULAR | Status: DC | PRN
  Administered 2020-01-24: 4 mg via INTRAVENOUS

## 2020-01-24 MED ORDER — PROPOFOL 10 MG/ML IV BOLUS
INTRAVENOUS | Status: AC
  Filled 2020-01-24: qty 20

## 2020-01-24 MED ORDER — ASPIRIN EC 81 MG PO TBEC
81.0000 mg | DELAYED_RELEASE_TABLET | ORAL | Status: DC
  Administered 2020-01-26 – 2020-01-31 (×3): 81 mg via ORAL
  Filled 2020-01-24 (×2): qty 1

## 2020-01-24 MED ORDER — PROPOFOL 10 MG/ML IV BOLUS
INTRAVENOUS | Status: DC | PRN
  Administered 2020-01-24: 50 mg via INTRAVENOUS
  Administered 2020-01-24: 150 mg via INTRAVENOUS

## 2020-01-24 MED ORDER — LACTATED RINGERS IV SOLN: INTRAVENOUS | Status: DC | PRN

## 2020-01-24 MED ORDER — PANTOPRAZOLE SODIUM 40 MG PO TBEC
40.0000 mg | DELAYED_RELEASE_TABLET | Freq: Every day | ORAL | Status: DC
  Administered 2020-01-25 – 2020-02-01 (×8): 40 mg via ORAL
  Filled 2020-01-24 (×8): qty 1

## 2020-01-24 MED ORDER — DEXAMETHASONE SODIUM PHOSPHATE 10 MG/ML IJ SOLN
INTRAMUSCULAR | Status: AC
  Filled 2020-01-24: qty 1

## 2020-01-24 MED ORDER — ONDANSETRON HCL 4 MG/2ML IJ SOLN
INTRAMUSCULAR | Status: DC | PRN
  Administered 2020-01-24: 4 mg via INTRAVENOUS

## 2020-01-24 MED ORDER — SODIUM CHLORIDE 0.9% FLUSH: 9.0000 mL | INTRAVENOUS | Status: DC | PRN

## 2020-01-24 MED ORDER — IRBESARTAN 300 MG PO TABS
300.0000 mg | ORAL_TABLET | Freq: Every day | ORAL | Status: DC
  Filled 2020-01-24: qty 1

## 2020-01-24 MED ORDER — TRAMADOL HCL 50 MG PO TABS: 50.0000 mg | ORAL_TABLET | Freq: Four times a day (QID) | ORAL | Status: DC | PRN

## 2020-01-24 MED ORDER — HYDROCHLOROTHIAZIDE 12.5 MG PO CAPS
12.5000 mg | ORAL_CAPSULE | Freq: Every day | ORAL | Status: DC
  Administered 2020-01-25 – 2020-02-01 (×8): 12.5 mg via ORAL
  Filled 2020-01-24 (×8): qty 1

## 2020-01-24 MED ORDER — ALBUMIN HUMAN 5 % IV SOLN: INTRAVENOUS | Status: DC | PRN

## 2020-01-24 MED ORDER — ONDANSETRON HCL 4 MG/2ML IJ SOLN
INTRAMUSCULAR | Status: AC
  Filled 2020-01-24: qty 2

## 2020-01-24 MED ORDER — CEFAZOLIN SODIUM-DEXTROSE 2-4 GM/100ML-% IV SOLN: 2.0000 g | INTRAVENOUS | Status: DC

## 2020-01-24 MED ORDER — ACETAMINOPHEN 500 MG PO TABS
500.0000 mg | ORAL_TABLET | Freq: Four times a day (QID) | ORAL | Status: DC | PRN
  Administered 2020-01-29 – 2020-01-31 (×4): 1000 mg via ORAL
  Filled 2020-01-24 (×4): qty 2

## 2020-01-24 MED ORDER — TELMISARTAN-HCTZ 80-12.5 MG PO TABS: 1.0000 | ORAL_TABLET | Freq: Every day | ORAL | Status: DC

## 2020-01-24 MED ORDER — BUPIVACAINE HCL (PF) 0.5 % IJ SOLN
INTRAMUSCULAR | Status: AC
  Filled 2020-01-24: qty 30

## 2020-01-24 MED ORDER — 0.9 % SODIUM CHLORIDE (POUR BTL) OPTIME
TOPICAL | Status: DC | PRN
  Administered 2020-01-24: 2000 mL

## 2020-01-24 MED ORDER — FENTANYL CITRATE (PF) 250 MCG/5ML IJ SOLN
INTRAMUSCULAR | Status: AC
  Filled 2020-01-24: qty 5

## 2020-01-24 MED ORDER — ALBUTEROL SULFATE (2.5 MG/3ML) 0.083% IN NEBU: 2.5000 mg | INHALATION_SOLUTION | RESPIRATORY_TRACT | Status: DC

## 2020-01-24 MED ORDER — MIDAZOLAM HCL 2 MG/2ML IJ SOLN
INTRAMUSCULAR | Status: AC
  Filled 2020-01-24: qty 2

## 2020-01-24 MED ORDER — OXYBUTYNIN CHLORIDE 5 MG PO TABS
5.0000 mg | ORAL_TABLET | Freq: Every day | ORAL | Status: DC
  Administered 2020-01-25 – 2020-02-01 (×8): 5 mg via ORAL
  Filled 2020-01-24 (×8): qty 1

## 2020-01-24 MED ORDER — ACETAMINOPHEN 500 MG PO TABS
1000.0000 mg | ORAL_TABLET | Freq: Four times a day (QID) | ORAL | Status: AC
  Administered 2020-01-24 – 2020-01-29 (×17): 1000 mg via ORAL
  Filled 2020-01-24 (×17): qty 2

## 2020-01-24 MED ORDER — DIPHENHYDRAMINE HCL 50 MG/ML IJ SOLN: 12.5000 mg | Freq: Four times a day (QID) | INTRAMUSCULAR | Status: DC | PRN

## 2020-01-24 MED ORDER — FENTANYL 50 MCG/ML IV PCA SOLN
INTRAVENOUS | Status: DC
  Administered 2020-01-24: 20 ug via INTRAVENOUS
  Administered 2020-01-24: 50 ug via INTRAVENOUS
  Administered 2020-01-25 (×2): 0 ug via INTRAVENOUS
  Administered 2020-01-25: 0 ug/h via INTRAVENOUS
  Administered 2020-01-26 (×2): 0 ug via INTRAVENOUS
  Filled 2020-01-24 (×2): qty 20

## 2020-01-24 MED ORDER — NALOXONE HCL 0.4 MG/ML IJ SOLN: 0.4000 mg | INTRAMUSCULAR | Status: DC | PRN

## 2020-01-24 MED ORDER — PHENYLEPHRINE HCL-NACL 10-0.9 MG/250ML-% IV SOLN
INTRAVENOUS | Status: DC | PRN
  Administered 2020-01-24: 20 ug/min via INTRAVENOUS
  Administered 2020-01-24: 40 ug/min via INTRAVENOUS

## 2020-01-24 MED ORDER — LIDOCAINE 2% (20 MG/ML) 5 ML SYRINGE
INTRAMUSCULAR | Status: AC
  Filled 2020-01-24: qty 5

## 2020-01-24 MED ORDER — HEMOSTATIC AGENTS (NO CHARGE) OPTIME
TOPICAL | Status: DC | PRN
  Administered 2020-01-24: 1 via TOPICAL
  Administered 2020-01-24: 3 via TOPICAL
  Administered 2020-01-24: 2 via TOPICAL
  Administered 2020-01-24: 1 via TOPICAL
  Administered 2020-01-24: 2 via TOPICAL

## 2020-01-24 MED ORDER — OXYCODONE HCL 5 MG PO TABS: 5.0000 mg | ORAL_TABLET | ORAL | Status: DC | PRN

## 2020-01-24 MED ORDER — FENTANYL CITRATE (PF) 250 MCG/5ML IJ SOLN
INTRAMUSCULAR | Status: DC | PRN
  Administered 2020-01-24 (×5): 50 ug via INTRAVENOUS
  Administered 2020-01-24: 100 ug via INTRAVENOUS
  Administered 2020-01-24 (×6): 50 ug via INTRAVENOUS

## 2020-01-24 MED ORDER — MIDAZOLAM HCL 5 MG/5ML IJ SOLN
INTRAMUSCULAR | Status: DC | PRN
  Administered 2020-01-24 (×2): 1 mg via INTRAVENOUS

## 2020-01-24 MED ORDER — LEVALBUTEROL HCL 0.63 MG/3ML IN NEBU
0.6300 mg | INHALATION_SOLUTION | Freq: Four times a day (QID) | RESPIRATORY_TRACT | Status: DC
  Administered 2020-01-24 – 2020-01-25 (×4): 0.63 mg via RESPIRATORY_TRACT
  Filled 2020-01-24 (×4): qty 3

## 2020-01-24 MED ORDER — ATORVASTATIN CALCIUM 40 MG PO TABS
40.0000 mg | ORAL_TABLET | Freq: Every day | ORAL | Status: DC
  Administered 2020-01-25 – 2020-02-01 (×8): 40 mg via ORAL
  Filled 2020-01-24 (×8): qty 1

## 2020-01-24 MED ORDER — CEFAZOLIN SODIUM-DEXTROSE 2-3 GM-%(50ML) IV SOLR
INTRAVENOUS | Status: DC | PRN
  Administered 2020-01-24 (×3): 2 g via INTRAVENOUS

## 2020-01-24 MED ORDER — SODIUM CHLORIDE 0.9 % IV SOLN: INTRAVENOUS | Status: DC

## 2020-01-24 MED ORDER — DEXTROSE-NACL 5-0.9 % IV SOLN: INTRAVENOUS | Status: DC

## 2020-01-24 MED ORDER — SODIUM CHLORIDE FLUSH 0.9 % IV SOLN
INTRAVENOUS | Status: DC | PRN
  Administered 2020-01-24: 100 mL

## 2020-01-24 MED ORDER — ROCURONIUM BROMIDE 100 MG/10ML IV SOLN
INTRAVENOUS | Status: DC | PRN
  Administered 2020-01-24: 10 mg via INTRAVENOUS
  Administered 2020-01-24: 20 mg via INTRAVENOUS
  Administered 2020-01-24 (×2): 10 mg via INTRAVENOUS
  Administered 2020-01-24 (×2): 20 mg via INTRAVENOUS
  Administered 2020-01-24: 60 mg via INTRAVENOUS
  Administered 2020-01-24 (×3): 20 mg via INTRAVENOUS
  Administered 2020-01-24: 30 mg via INTRAVENOUS
  Administered 2020-01-24: 20 mg via INTRAVENOUS

## 2020-01-24 MED ORDER — BUPIVACAINE LIPOSOME 1.3 % IJ SUSP
20.0000 mL | Freq: Once | INTRAMUSCULAR | Status: DC
  Filled 2020-01-24: qty 20

## 2020-01-24 MED ORDER — CARVEDILOL 12.5 MG PO TABS
12.5000 mg | ORAL_TABLET | Freq: Every day | ORAL | Status: DC
  Administered 2020-01-25 – 2020-02-01 (×8): 12.5 mg via ORAL
  Filled 2020-01-24 (×8): qty 1

## 2020-01-24 MED ORDER — SUGAMMADEX SODIUM 200 MG/2ML IV SOLN
INTRAVENOUS | Status: DC | PRN
  Administered 2020-01-24: 400 mg via INTRAVENOUS

## 2020-01-24 MED ORDER — ACETAMINOPHEN 500 MG PO TABS
1000.0000 mg | ORAL_TABLET | Freq: Once | ORAL | Status: AC
  Administered 2020-01-24: 1000 mg via ORAL
  Filled 2020-01-24: qty 2

## 2020-01-24 MED ORDER — MIDAZOLAM HCL 2 MG/2ML IJ SOLN
INTRAMUSCULAR | Status: AC
  Administered 2020-01-24: 19:00:00 1 mg
  Filled 2020-01-24: qty 2

## 2020-01-24 MED ORDER — DEXMEDETOMIDINE HCL IN NACL 80 MCG/20ML IV SOLN
INTRAVENOUS | Status: AC
  Filled 2020-01-24: qty 20

## 2020-01-24 MED ORDER — IPRATROPIUM BROMIDE 0.06 % NA SOLN
1.0000 | Freq: Every day | NASAL | Status: DC
  Administered 2020-01-26 – 2020-01-31 (×6): 1 via NASAL
  Filled 2020-01-24 (×2): qty 15
  Filled 2020-01-24 (×2): qty 30

## 2020-01-24 MED ORDER — BISACODYL 5 MG PO TBEC
10.0000 mg | DELAYED_RELEASE_TABLET | Freq: Every day | ORAL | Status: DC
  Administered 2020-01-25 – 2020-01-30 (×6): 10 mg via ORAL
  Filled 2020-01-24 (×7): qty 2

## 2020-01-24 MED ORDER — ACETAMINOPHEN 160 MG/5ML PO SOLN: 1000.0000 mg | Freq: Four times a day (QID) | ORAL | Status: AC

## 2020-01-24 MED ORDER — ONDANSETRON HCL 4 MG/2ML IJ SOLN: 4.0000 mg | Freq: Once | INTRAMUSCULAR | Status: DC | PRN

## 2020-01-24 MED ORDER — ROCURONIUM BROMIDE 10 MG/ML (PF) SYRINGE
PREFILLED_SYRINGE | INTRAVENOUS | Status: AC
  Filled 2020-01-24: qty 10

## 2020-01-24 SURGICAL SUPPLY — 133 items
ADAPTER VALVE BIOPSY EBUS (MISCELLANEOUS) IMPLANT
ADPTR VALVE BIOPSY EBUS (MISCELLANEOUS)
APPLICATOR TIP EXT COSEAL (VASCULAR PRODUCTS) IMPLANT
BLADE CLIPPER SURG (BLADE) IMPLANT
BLADE SURG 11 STRL SS (BLADE) ×4 IMPLANT
BRUSH CYTOL CELLEBRITY 1.5X140 (MISCELLANEOUS) IMPLANT
CANISTER SUCT 3000ML PPV (MISCELLANEOUS) ×4 IMPLANT
CANNULA REDUC XI 12-8 STAPL (CANNULA) ×2
CANNULA REDUCER 12-8 DVNC XI (CANNULA) ×6 IMPLANT
CATH THORACIC 28FR (CATHETERS) IMPLANT
CATH THORACIC 36FR (CATHETERS) IMPLANT
CATH THORACIC 36FR RT ANG (CATHETERS) IMPLANT
CLIP VESOCCLUDE MED 6/CT (CLIP) IMPLANT
CNTNR URN SCR LID CUP LEK RST (MISCELLANEOUS) ×39 IMPLANT
CONN ST 1/4X3/8  BEN (MISCELLANEOUS) ×1
CONN ST 1/4X3/8 BEN (MISCELLANEOUS) ×3 IMPLANT
CONN Y 3/8X3/8X3/8  BEN (MISCELLANEOUS)
CONN Y 3/8X3/8X3/8 BEN (MISCELLANEOUS) IMPLANT
CONT SPEC 4OZ CLIKSEAL STRL BL (MISCELLANEOUS) IMPLANT
CONT SPEC 4OZ STRL OR WHT (MISCELLANEOUS) ×13
COVER BACK TABLE 60X90IN (DRAPES) IMPLANT
COVER SURGICAL LIGHT HANDLE (MISCELLANEOUS) IMPLANT
DEFOGGER SCOPE WARMER CLEARIFY (MISCELLANEOUS) ×4 IMPLANT
DERMABOND ADVANCED (GAUZE/BANDAGES/DRESSINGS) ×2
DERMABOND ADVANCED .7 DNX12 (GAUZE/BANDAGES/DRESSINGS) ×6 IMPLANT
DISSECTOR BLUNT TIP ENDO 5MM (MISCELLANEOUS) IMPLANT
DRAIN CHANNEL 28F RND 3/8 FF (WOUND CARE) ×4 IMPLANT
DRAPE ARM DVNC X/XI (DISPOSABLE) ×12 IMPLANT
DRAPE COLUMN DVNC XI (DISPOSABLE) ×3 IMPLANT
DRAPE CV SPLIT W-CLR ANES SCRN (DRAPES) ×4 IMPLANT
DRAPE DA VINCI XI ARM (DISPOSABLE) ×4
DRAPE DA VINCI XI COLUMN (DISPOSABLE) ×1
DRAPE LAPAROSCOPIC ABDOMINAL (DRAPES) IMPLANT
DRAPE ORTHO SPLIT 77X108 STRL (DRAPES) ×1
DRAPE SURG ORHT 6 SPLT 77X108 (DRAPES) ×3 IMPLANT
DRAPE WARM FLUID 44X44 (DRAPES) IMPLANT
ELECT BLADE 4.0 EZ CLEAN MEGAD (MISCELLANEOUS)
ELECT REM PT RETURN 9FT ADLT (ELECTROSURGICAL) ×4
ELECTRODE BLDE 4.0 EZ CLN MEGD (MISCELLANEOUS) IMPLANT
ELECTRODE REM PT RTRN 9FT ADLT (ELECTROSURGICAL) ×3 IMPLANT
FILTER SMOKE EVAC ULPA (FILTER) IMPLANT
FORCEPS BIOP RJ4 1.8 (CUTTING FORCEPS) IMPLANT
GAUZE KITTNER 4X10 (MISCELLANEOUS) ×12 IMPLANT
GAUZE SPONGE 4X4 12PLY STRL (GAUZE/BANDAGES/DRESSINGS) ×4 IMPLANT
GLOVE BIO SURGEON STRL SZ 6.5 (GLOVE) ×8 IMPLANT
GLOVE BIO SURGEON STRL SZ7.5 (GLOVE) ×4 IMPLANT
GLOVE SURG SS PI 6.0 STRL IVOR (GLOVE) ×4 IMPLANT
GOWN PREVENTION PLUS LG XLONG (DISPOSABLE) ×12 IMPLANT
GOWN STRL REUS W/ TWL LRG LVL3 (GOWN DISPOSABLE) ×21 IMPLANT
GOWN STRL REUS W/TWL LRG LVL3 (GOWN DISPOSABLE) ×7
HEMOSTAT SURGICEL 2X14 (HEMOSTASIS) ×36 IMPLANT
IRRIGATOR SUCT 8 DISP DVNC XI (IRRIGATION / IRRIGATOR) IMPLANT
IRRIGATOR SUCTION 8MM XI DISP (IRRIGATION / IRRIGATOR)
KIT BASIN OR (CUSTOM PROCEDURE TRAY) ×4 IMPLANT
KIT CLEAN ENDO COMPLIANCE (KITS) IMPLANT
KIT SUCTION CATH 14FR (SUCTIONS) ×4 IMPLANT
KIT TURNOVER KIT B (KITS) ×4 IMPLANT
MARKER SKIN DUAL TIP RULER LAB (MISCELLANEOUS) IMPLANT
NEEDLE SPNL 18GX3.5 QUINCKE PK (NEEDLE) IMPLANT
NS IRRIG 1000ML POUR BTL (IV SOLUTION) ×4 IMPLANT
OBTURATOR OPTICAL STANDARD 8MM (TROCAR) ×1
OBTURATOR OPTICAL STND 8 DVNC (TROCAR) ×3
OBTURATOR OPTICALSTD 8 DVNC (TROCAR) ×3 IMPLANT
OIL SILICONE PENTAX (PARTS (SERVICE/REPAIRS)) IMPLANT
PACK CHEST (CUSTOM PROCEDURE TRAY) ×4 IMPLANT
PAD ARMBOARD 7.5X6 YLW CONV (MISCELLANEOUS) ×8 IMPLANT
PASSER SUT SWANSON 36MM LOOP (INSTRUMENTS) IMPLANT
PENCIL SMOKE EVACUATOR (MISCELLANEOUS) IMPLANT
POUCH RETRIEVAL ECOSAC 10 (ENDOMECHANICALS) ×3 IMPLANT
POUCH RETRIEVAL ECOSAC 10MM (ENDOMECHANICALS) ×1
RELOAD STAPLER 2.5X45 WHT DVNC (STAPLE) ×9 IMPLANT
RELOAD STAPLER 3.5X45 BLU DVNC (STAPLE) ×18 IMPLANT
RELOAD STAPLER 4.3X45 GRN DVNC (STAPLE) ×6 IMPLANT
RELOAD STAPLER 45 4.6 BLK DVNC (STAPLE) ×21 IMPLANT
SEAL CANN UNIV 5-8 DVNC XI (MISCELLANEOUS) ×6 IMPLANT
SEAL XI 5MM-8MM UNIVERSAL (MISCELLANEOUS) ×2
SEALANT PROGEL (MISCELLANEOUS) IMPLANT
SEALANT SURG COSEAL 4ML (VASCULAR PRODUCTS) IMPLANT
SEALANT SURG COSEAL 8ML (VASCULAR PRODUCTS) IMPLANT
SEALER LIGASURE MARYLAND 30 (ELECTROSURGICAL) ×4 IMPLANT
SET TUBE SMOKE EVAC HIGH FLOW (TUBING) ×4 IMPLANT
SLEEVE SUCTION 125 (MISCELLANEOUS) IMPLANT
SOL ANTI FOG 6CC (MISCELLANEOUS) IMPLANT
SOLUTION ANTI FOG 6CC (MISCELLANEOUS)
SOLUTION ELECTROLUBE (MISCELLANEOUS) ×4 IMPLANT
STAPLE RELOAD 45 2.0 GRAY (STAPLE) ×3
STAPLE RELOAD 45 2.0 GRAY DVNC (STAPLE) ×9 IMPLANT
STAPLER 45 SUREFORM CVD (STAPLE) ×2
STAPLER 45 SUREFORM CVD DVNC (STAPLE) ×6 IMPLANT
STAPLER RELOAD 2.5X45 WHITE (STAPLE) ×3
STAPLER RELOAD 2.5X45 WHT DVNC (STAPLE) ×9
STAPLER RELOAD 3.5X45 BLU DVNC (STAPLE) ×18
STAPLER RELOAD 3.5X45 BLUE (STAPLE) ×6
STAPLER RELOAD 4.3X45 GREEN (STAPLE) ×2
STAPLER RELOAD 4.3X45 GRN DVNC (STAPLE) ×6
STAPLER RELOAD 45 4.6 BLK (STAPLE) ×7
STAPLER RELOAD 45 4.6 BLK DVNC (STAPLE) ×21
STOPCOCK 4 WAY LG BORE MALE ST (IV SETS) ×4 IMPLANT
SUT PROLENE 3 0 SH DA (SUTURE) IMPLANT
SUT PROLENE 4 0 RB 1 (SUTURE)
SUT PROLENE 4-0 RB1 .5 CRCL 36 (SUTURE) IMPLANT
SUT SILK  1 MH (SUTURE) ×4
SUT SILK 1 MH (SUTURE) ×12 IMPLANT
SUT SILK 1 TIES 10X30 (SUTURE) IMPLANT
SUT SILK 2 0SH CR/8 30 (SUTURE) ×4 IMPLANT
SUT VIC AB 1 CTX 18 (SUTURE) IMPLANT
SUT VIC AB 1 CTX 36 (SUTURE)
SUT VIC AB 1 CTX36XBRD ANBCTR (SUTURE) IMPLANT
SUT VIC AB 2-0 CTX 36 (SUTURE) ×8 IMPLANT
SUT VIC AB 3-0 X1 27 (SUTURE) ×8 IMPLANT
SUT VICRYL 0 TIES 12 18 (SUTURE) ×4 IMPLANT
SUT VICRYL 0 UR6 27IN ABS (SUTURE) ×8 IMPLANT
SUT VICRYL 2 TP 1 (SUTURE) IMPLANT
SYR 20ML ECCENTRIC (SYRINGE) ×8 IMPLANT
SYR 20ML LL LF (SYRINGE) ×4 IMPLANT
SYR 50ML LL SCALE MARK (SYRINGE) ×4 IMPLANT
SYR 5ML LL (SYRINGE) ×4 IMPLANT
SYSTEM SAHARA CHEST DRAIN ATS (WOUND CARE) ×4 IMPLANT
TAPE CLOTH 4X10 WHT NS (GAUZE/BANDAGES/DRESSINGS) ×4 IMPLANT
TAPE UMBILICAL COTTON 1/8X30 (MISCELLANEOUS) IMPLANT
TIP APPLICATOR SPRAY EXTEND 16 (VASCULAR PRODUCTS) IMPLANT
TOWEL GREEN STERILE (TOWEL DISPOSABLE) ×4 IMPLANT
TOWEL GREEN STERILE FF (TOWEL DISPOSABLE) ×4 IMPLANT
TRAP FLUID SMOKE EVACUATOR (MISCELLANEOUS) IMPLANT
TRAP SPECIMEN MUCOUS 40CC (MISCELLANEOUS) IMPLANT
TRAY FOLEY MTR SLVR 16FR STAT (SET/KITS/TRAYS/PACK) ×4 IMPLANT
TROCAR XCEL 12X100 BLDLESS (ENDOMECHANICALS) ×4 IMPLANT
TUBE CONNECTING 20X1/4 (TUBING) IMPLANT
TUBING EXTENTION W/L.L. (IV SETS) ×4 IMPLANT
VALVE BIOPSY  SINGLE USE (MISCELLANEOUS)
VALVE BIOPSY SINGLE USE (MISCELLANEOUS) IMPLANT
VALVE SUCTION BRONCHIO DISP (MISCELLANEOUS) IMPLANT
WATER STERILE IRR 1000ML POUR (IV SOLUTION) ×8 IMPLANT

## 2020-01-24 NOTE — Progress Notes (Signed)
Per Dr. Servando Snare chest tube to water seal.  Order modified and released in Epic.

## 2020-01-24 NOTE — Discharge Summary (Signed)
Physician Discharge Summary       [address]       [place] [address]             [phone]    Patient ID: Daniel Colon MRN: [id number] DOB/AGE: 1946-01-31 74 y.o.  Admit date: 01/24/2020 Discharge date: 02/01/2020  Admission Diagnoses:  Discharge Diagnoses:  1. S/p bronchoscopy, robot assisted RUL wedge, RUL , lymph node dissection, intercostal nerve block. 2. History of Barrett's esophagus 3. History CAD 4. History of dyslipidemia 5. History of GERD (gastroesophageal reflux disease) 6. History of hypertension 7. History of nephrolithiasis 8. History of cataract 9. History of arthritis 10. History of PUD (peptic ulcer disease)  Consults: None  Procedure (s):  1.  Bronchoscopy.   2.  Robotic-assisted wedge resection, right upper lobe with completion left upper lobe lobectomy, lymph node dissection, and intercostal nerve block by Dr. Servando Snare on 01/24/2020.  Pathology:  FINAL MICROSCOPIC DIAGNOSIS:   A. LUNG, RIGHT UPPER LOBE, WEDGE RESECTION:  - Poorly differentiated carcinoma with sarcomatoid features, 2.2 cm.  - See oncology table and comment.   B. LYMPH NODE, LEVEL 7, EXCISION:  - Anthracotic lymph node with no metastatic carcinoma.   C. LYMPH NODE, 10R, EXCISION:  - Anthracotic lymph node with no metastatic carcinoma.   D. LYMPH NODE, 10R #2, EXCISION:  - Anthracotic lymph node with no metastatic carcinoma.   E. LYMPH NODE, 12R, EXCISION:  - Anthracotic lymph node with no metastatic carcinoma.   F. LYMPH NODE, 10R #3, EXCISION:  - Anthracotic lymph node with no metastatic carcinoma.   G. LYMPH NODE, 10R #4, EXCISION:  - Anthracotic lymph node with no metastatic carcinoma.   H. LYMPH NODE, 11R, EXCISION:  - Anthracotic lymph node with no metastatic carcinoma.   I. LYMPH NODE, 11R #2, EXCISION:  - Anthracotic lymph node with no metastatic carcinoma.   J. LYMPH NODE, 11R #3, EXCISION:  - Anthracotic lymph node with no metastatic  carcinoma.   K. LYMPH NODE, 11R #4, EXCISION:  - Anthracotic lymph node with no metastatic carcinoma.   L. LUNG, RIGHT UPPER LOBE COMPLETION, LOBECTOMY:  - Findings consistent with previous wedge biopsy.  - Final margins free of tumor.  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  TNM Code: pT1c, pN1   History of Presenting Illness: Daniel Colon 74 y.o. male is seen in the office  For new right upper lobe lung lesion.  Patient has a previous history of smoking for approximately 40 years both he and his wife quit smoking 9 years ago.  For the past 4 years he has been getting yearly CT lung cancer screening CTs, scan 1 year ago suggested a very small subpleural nodule in the right upper lobe, scan this year showed this is increased in size up to 1.4  cm.  Area is hypermetabolic on PET scan, there were NO associated enlarged mediastinal nodes were PET positive nodes.   The patient's previous history is also significant for known short segment Barrett's esophagus most recently evaluated with upper GI endoscopy in December 2020-biopsy showed no dysplasia.  He had a Billroth II done while living in [place]   Patient has known coronary occlusive disease, with stents placed in the right coronary artery in 1996 while living [place].  His recent CT scan showed very extensive coronary calcifications in the both right and left system.  He had cardiac catheterization done in 2014.  He has no definite  angina, but does have subtle symptoms of some wheezing occasionally with exertion, he does note shortness of breath when walking up hills but continues to walk about 3 miles every other day in his neighborhood.   Since last seen the patient has seen  Dr. Percival Spanish for cardiac  preop clearance.   Brief Hospital Course:  The patient remained afebrile and hemodynamically stable. A line and foley were removed early in the post operative course. Chest tube output gradually  decreased and there was an air leak. Chest x ray on 02/04 showed  A right pneumothorax. Chest tube remained on water seal;however, on 02/05, chest xray showed an increase in the right pneumothorax. Chest tube was placed to 10 cm of suction. Suction was increased to 20 cm as right space had increased slightly. Patient was not using PCA much so this was removed on 02/05. Chest tube had a very small, intermittent air leak with cough. Chest tube was placed to water seal on 02/09. Chest x ray remained stable. Chest tube was removed on 02/10. Patient is ambulating on room air. Patient is tolerating a diet and has had a bowel movement. Wounds are clean and dry. Final chest X ray showed right apical hydropneumothorax is present. Pneumothorax component is not notably changed. Air-fluid levels within the resection cavity are noted. Of note, Dr. Servando Snare discussed the final pathology with the patient and he will need to see oncology after discharge. As discussed with Dr. Servando Snare, the patient is felt surgically stable for discharge today.    Latest Vital Signs: Blood pressure (!) 153/70, pulse 62, temperature 98.3 F (36.8 C), temperature source Oral, resp. rate 18, height 5\' 8"  (1.727 m), weight 207 lb 10.8 oz (94.2 kg), SpO2 94 %.  Physical Exam: Cardiovascular: RRR Pulmonary: Clear to auscultation on left and diminished right apex Abdomen: Soft, non tender, bowel sounds present. Extremities: No lower extremity edema. Wounds: Clean and dry.  No erythema or signs of infection.  Discharge Condition: Stable and discharged to home.  Recent laboratory studies:  Lab Results  Component Value Date   WBC 6.6 01/29/2020   HGB 9.7 (L) 01/29/2020   HCT 30.8 (L) 01/29/2020   MCV 84.8 01/29/2020   PLT 193 01/29/2020   Lab Results  Component Value Date   NA 140 01/29/2020   K 3.5 01/29/2020   CL 104 01/29/2020   CO2 26 01/29/2020   CREATININE 0.68 01/29/2020   GLUCOSE 114 (H) 01/29/2020      Diagnostic  Studies: DG Chest 2 View  Result Date: 02/01/2020 CLINICAL DATA:  Patient status post right upper lobectomy and lymph node dissection for carcinoma 01/25/2020. EXAM: CHEST - 2 VIEW COMPARISON:  Single view of the chest 01/31/2020 and 01/30/2020. FINDINGS: Right chest tube has been removed. Apical right hydropneumothorax is identified. In addition to the air-fluid level at the base of the pneumothorax, one or two short air-fluid levels are seen within the resection cavity. The left lung is expanded and clear. Right basilar atelectasis noted. Heart size is normal. IMPRESSION: Status post right chest tube removal. Right apical hydropneumothorax is present. Pneumothorax component is not notably changed. Air-fluid levels within the resection cavity are noted. Recommend attention on follow-up exams. Electronically Signed   By: Inge Rise M.D.   On: 02/01/2020 09:26   DG Chest 2 View  Result Date: 01/22/2020 CLINICAL DATA:  Preoperative evaluation. EXAM: CHEST - 2 VIEW COMPARISON:  Chest CT, dated December 27, 2019, is available for comparison. FINDINGS: A 1.4 cm  ill-defined nodular opacity is seen overlying the lateral aspect of the mid to upper right lung. There is no evidence of a pleural effusion or pneumothorax. The heart size and mediastinal contours are within normal limits. There is mild calcification of the aortic arch. The visualized skeletal structures are unremarkable. IMPRESSION: 1. 1.4 cm ill-defined right upper lobe lung nodule which corresponds to the noncalcified lung nodule seen within this region on the prior chest CT dated December 27, 2019. Electronically Signed   By: Virgina Norfolk M.D.   On: 01/22/2020 21:44   NM PET Image Initial (PI) Skull Base To Thigh  Result Date: 01/05/2020 CLINICAL DATA:  Initial treatment strategy for new 1.6 cm nodule in the posterior right upper lobe on screening CT. EXAM: NUCLEAR MEDICINE PET SKULL BASE TO THIGH TECHNIQUE: 11.4 mCi F-18 FDG was injected  intravenously. Full-ring PET imaging was performed from the skull base to thigh after the radiotracer. CT data was obtained and used for attenuation correction and anatomic localization. Fasting blood glucose: 92 mg/dl COMPARISON:  Screening CT 12/27/2019. FINDINGS: Mediastinal blood pool activity: SUV max 2.2 Liver activity: SUV max NA NECK: No areas of abnormal hypermetabolism. Incidental CT findings: No cervical adenopathy. Left maxillary sinus mucosal thickening. CHEST: No cervical nodal hypermetabolism. Hypermetabolism corresponding to the perifissural right upper lobe pulmonary nodule. 1.4 cm and a S.U.V. max of 9.2 on 105/3. Incidental CT findings: Deferred to recent screening CT. Aortic and coronary artery atherosclerosis. Centrilobular emphysema. ABDOMEN/PELVIS: No abdominopelvic parenchymal or nodal hypermetabolism. Incidental CT findings: Left adrenal thickening. Right adrenal 1.6 cm adenoma. High left hepatic lobe subcentimeter cyst. Surgical changes of gastrojejunostomy. Mild prostatomegaly. Small fat containing right inguinal hernia. SKELETON: No abnormal marrow activity. Incidental CT findings: Degenerative changes of both hips. IMPRESSION: 1. Hypermetabolic posterior right upper lobe lung nodule, consistent with primary bronchogenic carcinoma. 2. No thoracic nodal or extrathoracic hypermetabolic metastasis. 3. Presuming non-small-cell histology, T1bN0M0 or stage IA. 4. Aortic atherosclerosis (ICD10-I70.0), coronary artery atherosclerosis and emphysema (ICD10-J43.9). 5. Right adrenal adenoma. Electronically Signed   By: Abigail Miyamoto M.D.   On: 01/05/2020 16:24   DG CHEST PORT 1 VIEW  Result Date: 01/31/2020 CLINICAL DATA:  Shortness of breath, chest tube present. EXAM: PORTABLE CHEST 1 VIEW COMPARISON:  January 30, 2020. FINDINGS: Stable cardiomediastinal silhouette. Stable position of right-sided chest tube. Stable moderate size right apical pneumothorax is noted. Left lung is clear. Minimal left  pleural effusion is again noted. The visualized skeletal structures are unremarkable. IMPRESSION: Stable position of right-sided chest tube. Stable moderate size right apical pneumothorax. Minimal left pleural effusion. Electronically Signed   By: Marijo Conception M.D.   On: 01/31/2020 09:17   DG Chest Port 1 View  Result Date: 01/30/2020 CLINICAL DATA:  Chest tube placement. EXAM: PORTABLE CHEST 1 VIEW COMPARISON:  January 29, 2020. FINDINGS: Stable cardiomediastinal silhouette. Minimal left pleural effusion is noted. Stable position of right-sided chest tube. Stable moderate size right apical pneumothorax is noted. Right internal jugular catheter has been removed. Bony thorax is unremarkable. IMPRESSION: Stable moderate size right apical pneumothorax. Stable position of right-sided chest tube. Minimal left pleural effusion. Right internal jugular catheter has been removed. Electronically Signed   By: Marijo Conception M.D.   On: 01/30/2020 09:31   DG CHEST PORT 1 VIEW  Result Date: 01/29/2020 CLINICAL DATA:  74 year old male postoperative day 5 right upper lobectomy for pulmonary nodule. EXAM: PORTABLE CHEST 1 VIEW COMPARISON:  Portable chest 01/28/2020 and earlier. FINDINGS: Portable AP semi upright  view at 0503 hours. Stable right chest tube and right IJ central line. Unchanged superior right pneumothorax, with staple line along the pleural margin adjacent to the right hilum. Right chest wall subcutaneous gas is stable. Residual right lung aeration is stable. Stable cardiac size and mediastinal contours. Left lung remains clear allowing for portable technique. Visualized tracheal air column is within normal limits. Negative visible bowel gas pattern. IMPRESSION: 1. Stable superior pneumothorax following right upper lobectomy. 2.  Stable lines and tubes. Electronically Signed   By: Genevie Ann M.D.   On: 01/29/2020 06:02   DG CHEST PORT 1 VIEW  Result Date: 01/28/2020 CLINICAL DATA:  Pneumothorax EXAM: PORTABLE  CHEST 1 VIEW COMPARISON:  Earlier today FINDINGS: Right chest tube in place. Mildly decreased right pneumothorax which measures 6.7 cm craniocaudal as opposed to 7.6 cm previously. Lung volumes are low. Postoperative distortion of the right hilum. Normal heart size. IMPRESSION: Very mild decrease in the right pneumothorax. Electronically Signed   By: Monte Fantasia M.D.   On: 01/28/2020 14:14   DG CHEST PORT 1 VIEW  Result Date: 01/28/2020 CLINICAL DATA:  S/p RIGHT VATS and lobectomy. EXAM: PORTABLE CHEST 1 VIEW COMPARISON:  01/27/2020 FINDINGS: RIGHT apical pneumothorax is increased in size, now moderate and approximately 30%. A RIGHT thoracostomy catheter is unchanged. RIGHT subcutaneous emphysema again noted. A RIGHT IJ central venous catheter with tip overlying the LOWER SVC and mild LEFT basilar atelectasis noted. Surgical sutures at the RIGHT lung apex identified. No other significant change. IMPRESSION: 1. Increased RIGHT apical pneumothorax, now moderate and approximately 30%. 2. No other significant change. These results will be called to the ordering clinician or representative by the Radiologist Assistant, and communication documented in the PACS or zVision Dashboard. Electronically Signed   By: Margarette Canada M.D.   On: 01/28/2020 06:04   DG CHEST PORT 1 VIEW  Result Date: 01/27/2020 CLINICAL DATA:  Pneumothorax. EXAM: PORTABLE CHEST 1 VIEW COMPARISON:  January 26, 2020. FINDINGS: Stable cardiomediastinal silhouette. Left lung is clear. Right-sided chest tube is unchanged in position. Stable right apical pneumothorax is noted compared to prior exam. Right internal jugular catheter is unchanged. Stable subcutaneous emphysema seen over right lateral chest wall. Bony thorax is unremarkable. IMPRESSION: Stable right apical pneumothorax is noted compared to prior exam. Right-sided chest tube is unchanged in position. Electronically Signed   By: Marijo Conception M.D.   On: 01/27/2020 09:45   DG CHEST PORT  1 VIEW  Result Date: 01/26/2020 CLINICAL DATA:  Pneumothorax and chest tube. EXAM: PORTABLE CHEST 1 VIEW COMPARISON:  01/25/2020 FINDINGS: Right chest tube is in stable position with the tip near the apex of the right chest. The right pneumothorax has decreased in size. There continues to be a 25% pneumothorax. Persistent densities in the right lung compatible with atelectasis. Overall, low lung volumes. Linear densities in the left lower lung are suggestive for atelectasis or scarring. Heart and mediastinum are within normal limits and stable. Right jugular central line at the superior cavoatrial junction. Again noted is subcutaneous gas in the right chest. IMPRESSION: Right pneumothorax has decreased in size. Residual 25% pneumothorax. Stable position of the right chest tube. Electronically Signed   By: Markus Daft M.D.   On: 01/26/2020 08:39   DG CHEST PORT 1 VIEW  Result Date: 01/25/2020 CLINICAL DATA:  Lobectomy.  Chest tube. EXAM: PORTABLE CHEST 1 VIEW COMPARISON:  01/24/2020. FINDINGS: Right IJ line and right chest tube in stable position. Previously identified prominent right  pneumothorax again noted, interim progression. Postsurgical changes right lung. Low lung volumes. Stable cardiomegaly. Right chest wall subcutaneous emphysema again noted. IMPRESSION: 1. Right IJ line right chest tube in stable position. Previously identified prominent right pneumothorax is again noted. Interim progression from prior exam. Postsurgical changes right lung. Right chest wall subcutaneous emphysema again noted. 2.  Stable cardiomegaly. Electronically Signed   By: Marcello Moores  Register   On: 01/25/2020 08:36   DG Chest Port 1 View  Result Date: 01/24/2020 CLINICAL DATA:  Status post lobectomy.  Chest tube in place. EXAM: PORTABLE CHEST 1 VIEW COMPARISON:  01/22/2020 FINDINGS: The patient is status post lobectomy. A right-sided chest tube is in place. There is a large right-sided pneumothorax. There is a right-sided central  venous catheter with tip projecting over the cavoatrial junction. Subcutaneous gas is noted along the patient's right flank. The heart size is normal. Aortic calcifications are noted. IMPRESSION: 1. Lines and tubes as above. 2. Large right-sided pneumothorax despite chest tube placement. 3. Postsurgical changes related to recent lobectomy. These results will be called to the ordering clinician or representative by the Radiologist Assistant, and communication documented in the PACS or zVision Dashboard. Electronically Signed   By: Constance Holster M.D.   On: 01/24/2020 19:41       Discharge Medications: Allergies as of 02/01/2020   No Known Allergies      Medication List     TAKE these medications    acetaminophen 500 MG tablet Commonly known as: TYLENOL Take 500-1,000 mg by mouth every 6 (six) hours as needed (for pain.).   aspirin EC 81 MG tablet Take 81 mg by mouth 3 (three) times a week. In the morning   atorvastatin 40 MG tablet Commonly known as: LIPITOR TAKE 1 TABLET DAILY   carvedilol 12.5 MG tablet Commonly known as: COREG Take 12.5 mg by mouth daily.   Co Q-10 100 MG Caps Take 100 mg by mouth daily.   fish oil-omega-3 fatty acids 1000 MG capsule Take 1,000 mg by mouth daily.   guaiFENesin 600 MG 12 hr tablet Commonly known as: MUCINEX Take 1 tablet (600 mg total) by mouth 2 (two) times daily as needed for cough or to loosen phlegm.   ipratropium 0.03 % nasal spray Commonly known as: ATROVENT Place 1 spray into both nostrils daily.   multivitamin with minerals Tabs tablet Take 1 tablet by mouth daily.   nitroGLYCERIN 0.4 MG/SPRAY spray Commonly known as: NITROLINGUAL USE 1 SPRAY ON OR UNDER THE TONGUE EVERY 5 MINUTES FOR 3 DOSES AS NEEDED FOR CHEST PAIN AS DIRECTED BY PRESCRIBER OR PACKAGE INSTRUCTIONS What changed: See the new instructions.   omeprazole 10 MG capsule Commonly known as: PRILOSEC Take 10 mg by mouth daily.   oxybutynin 5 MG  tablet Commonly known as: DITROPAN Take 5 mg by mouth daily.   telmisartan-hydrochlorothiazide 80-12.5 MG tablet Commonly known as: MICARDIS HCT Take 1 tablet by mouth daily.   traMADol 50 MG tablet Commonly known as: ULTRAM Take 1 tablet (50 mg total) by mouth every 6 (six) hours as needed (mild pain).   Zetia 10 MG tablet Generic drug: ezetimibe Take 10 mg by mouth daily.        Follow Up Appointments: Follow-up Information     Grace Isaac, MD. Go on 02/22/2020.   Specialty: Cardiothoracic Surgery Why: PA/LAT CXR to be taken (at [facility] which is in the same building as Dr. Everrett Coombe office) on 03/04 at 12:00 pm;Appointment time is at  12:30 pm Contact information: [address] [place] [address] [phone]         [facility] . Go on 02/12/2020.   Specialty: Cardiothoracic Surgery Why: PA/LAT CXR to be taken (at [facility] which is in the same building as Dr. Everrett Coombe office) on 02/22 at;Appointment time is at 1:30 pm. Chest tube sutures will also be removed at this appointment. Contact information: [address], [place]           Signed: Sharalyn Ink [facility] 02/02/2020, 8:19 AM

## 2020-01-24 NOTE — H&P (Signed)
[address]       [place] [address]             [phone]                    Sabre Bueno [facility] Medical Record #[id number] Date of Birth: 1946-02-15  Referring: Magdalen Spatz, NP Primary Care: Leanna Battles, MD Primary Cardiologist: No primary care provider on file.  Chief Complaint:    Chief Complaint  Patient presents with  . Lung Lesion    New lung lesion on lung cancer screening ct      History of Present Illness:    Daniel Colon 74 y.o. male is seen in the office  For new right upper lobe lung lesion.  Patient has a previous history of smoking for approximately 40 years both he and his wife quit smoking 9 years ago.  For the past 4 years he has been getting yearly CT lung cancer screening CTs, scan 1 year ago suggested a very small subpleural nodule in the right upper lobe, scan this year showed this is increased in size up to 1.4  cm.  Area is hypermetabolic on PET scan, there were NO associated enlarged mediastinal nodes were PET positive nodes.  The patient's previous history is also significant for known short segment Barrett's esophagus most recently evaluated with upper GI endoscopy in December 2020-biopsy showed no dysplasia.  He had a Billroth II done while living in [place]  Patient has known coronary occlusive disease, with stents placed in the right coronary artery in 1996 while living [place].  His recent CT scan showed very extensive coronary calcifications in the both right and left system.  He had cardiac catheterization done in 2014.  He has no definite angina, but does have subtle symptoms of some wheezing occasionally with exertion, he does note shortness of breath when walking up hills but continues to walk about 3 miles every other day in his neighborhood.  Since last seen the patient has seen  Dr. Percival Spanish for cardiac  preop clearance.   Current Activity/ Functional Status:  Patient is independent with mobility/ambulation,  transfers, ADL's, IADL's.   Zubrod Score: At the time of surgery this patient's most appropriate activity status/level should be described as: [x]     0    Normal activity, no symptoms []     1    Restricted in physical strenuous activity but ambulatory, able to do out light work []     2    Ambulatory and capable of self care, unable to do work activities, up and about               >50 % of waking hours                              []     3    Only limited self care, in bed greater than 50% of waking hours []     4    Completely disabled, no self care, confined to bed or chair []     5    Moribund   Past Medical History:  Diagnosis Date  . Allergy   . Arthritis   . Barrett's esophagus   . CAD (coronary artery disease)    a. Stent to the [place] (in [place]);  b. ETT(11/13/13): abnormal with early ST changes to suggest ischemia;   c. LHC (11/17/13):  pLAD 30-40, D1 30, mLAD 70-75, mCFX 70, mRCA stent 40 (ISR), EF 55-65%.  Medical Rx  . Cataract    left eye removed, right immature  . Colon polyps   . Dyslipidemia   . GERD (gastroesophageal reflux disease)   . Hypertension   . Myocardial infarction ([place]) 1996  . Nephrolithiasis    kidney stone  . PUD (peptic ulcer disease)     Past Surgical History:  Procedure Laterality Date  . ANGIOPLASTY     stent - rt cor. art  . BILROTH II PROCEDURE    . COLONOSCOPY    . LEFT HEART CATHETERIZATION WITH CORONARY ANGIOGRAM N/A 11/17/2013   Procedure: LEFT HEART CATHETERIZATION WITH CORONARY ANGIOGRAM;  Surgeon: Blane Ohara, MD;  Location: [facility] C[facility] LAB;  Service: Cardiovascular;  Laterality: N/A;  . POLYPECTOMY    . ulcer surgery    . ULNAR NERVE TRANSPOSITION Right 08/05/2017   Procedure: RIGHT ULNAR NERVE DECOMPRESSION;  Surgeon: Leanora Cover, MD;  Location: [facility];  Service: Orthopedics;  Laterality: Right;    Family History  Problem Relation Age of Onset  . Sudden death Mother 73  . CAD Brother 42       CABG  .  CAD Sister 70       CABG  . CAD Sister 25       CABG  . Cancer Brother   . Liver disease Other   . Colon cancer Neg Hx   . Colon polyps Neg Hx   . Esophageal cancer Neg Hx   . Rectal cancer Neg Hx   . Stomach cancer Neg Hx      Social History   Tobacco Use  Smoking Status Former Smoker  . Packs/day: 1.00  . Years: 42.00  . Pack years: 42.00  . Types: Cigarettes  . Quit date: 06/21/2011  . Years since quitting: 8.6  Smokeless Tobacco Current User  . Types: Chew    Social History   Substance and Sexual Activity  Alcohol Use Yes  . Alcohol/week: 0.0 standard drinks   Comment: social     No Known Allergies  Current Facility-Administered Medications  Medication Dose Route Frequency Provider Last Rate Last Admin  . ceFAZolin (ANCEF) IVPB 2g/100 mL premix  2 g Intravenous 30 min Pre-Op Grace Isaac, MD        Pertinent items are noted in HPI.   Review of Systems:     Cardiac Review of Systems: [Y] = yes  or   [ N ] = no   Chest Pain [  Y  ]  Resting SOB Aqua.Slicker  ] Exertional SOB  [Y  ]  Vertell Limber Aqua.Slicker  ]   Pedal Edema [ N ]    Palpitations [ N] Syncope  [ N]   Presyncope Aqua.Slicker  ]   General Review of Systems: [Y] = yes [  ]=no Constitional: recent weight change [  ];  Wt loss over the last 3 months [   ] anorexia [  ]; fatigue [  ]; nausea [  ]; night sweats [  ]; fever [  ]; or chills [  ];           Eye : blurred vision [  ]; diplopia [   ]; vision changes [  ];  Amaurosis fugax[  ]; Resp: cough [  ];  wheezing[y  ];  hemoptysis[  ]; shortness of breath[  ]; paroxysmal nocturnal dyspnea[  ]; dyspnea on exertion[y  ];  or orthopnea[  ];  GI:  gallstones[  ], vomiting[  ];  dysphagia[  ]; melena[  ];  hematochezia [  ]; heartburn[  ];   Hx of  Colonoscopy[y dec 2020  ]; GU: kidney stones [ y ]; hematuria[  ];   dysuria [  ];  nocturia[  ];  history of     obstruction [  ]; urinary frequency [  ]             Skin: rash, swelling[  ];, hair loss[  ];  peripheral edema[  ];  or  itching[  ]; Musculosketetal: myalgias[  ];  joint swelling[ y ];  joint erythema[  ];  joint pain[ y ];  back pain[  ];  Heme/Lymph: bruising[  ];  bleeding[  ];  anemia[  ];  Neuro: TIA[  ];  headaches[  ];  stroke[  ];  vertigo[  ];  seizures[  ];   paresthesias[  ];  difficulty walking[  ];  Psych:depression[  ]; anxiety[  ];  Endocrine: diabetes[  ];  thyroid dysfunction[  ];  Immunizations: Flu up to date Jazmín.Cullens  ]; Pneumococcal up to date [ Y];  Other:  Recent EGD and colonoscopy: Diagnosis 1. Surgical [P], esophagus BX @ 41cm - INTESTINAL METAPLASIA CONSISTENT WITH BARRETT'S ESOPHAGUS. - NO DYSPLASIA OR MALIGNANCY. 2. Surgical [P], esophagus BX @ 39cm - INTESTINAL METAPLASIA CONSISTENT WITH BARRETT'S ESOPHAGUS. - NO DYSPLASIA OR MALIGNANCY. 3. Surgical [P], colon, ascending, hepatic flexure, polyp (3) - TUBULAR ADENOMA (X3 FRAGMENTS). - NO HIGH GRADE DYSPLASIA OR MALIGNANCY. JULIA MANNY MD   PHYSICAL EXAMINATION: BP (!) 135/57   Pulse (!) 54   Temp 98.5 F (36.9 C) (Oral)   Resp 19   Ht 5\' 8"  (1.727 m)   Wt 98.8 kg   SpO2 99%   BMI 33.12 kg/m  General appearance: alert, cooperative and no distress Head: Normocephalic, without obvious abnormality, atraumatic Neck: no adenopathy, no carotid bruit, no JVD, supple, symmetrical, trachea midline and thyroid not enlarged, symmetric, no tenderness/mass/nodules Lymph nodes: Cervical, supraclavicular, and axillary nodes normal. Resp: clear to auscultation bilaterally Cardio: regular rate and rhythm, S1, S2 normal, no murmur, click, rub or gallop GI: soft, non-tender; bowel sounds normal; no masses,  no organomegaly Extremities: extremities normal, atraumatic, no cyanosis or edema and Homans sign is negative, no sign of DVT Neurologic: Grossly normal  Diagnostic Studies & Laboratory data:     Recent Radiology Findings:   NM PET Image Initial (PI) Skull Base To Thigh  Result Date: 01/05/2020 CLINICAL DATA:  Initial treatment  strategy for new 1.6 cm nodule in the posterior right upper lobe on screening CT. EXAM: NUCLEAR MEDICINE PET SKULL BASE TO THIGH TECHNIQUE: 11.4 mCi F-18 FDG was injected intravenously. Full-ring PET imaging was performed from the skull base to thigh after the radiotracer. CT data was obtained and used for attenuation correction and anatomic localization. Fasting blood glucose: 92 mg/dl COMPARISON:  Screening CT 12/27/2019. FINDINGS: Mediastinal blood pool activity: SUV max 2.2 Liver activity: SUV max NA NECK: No areas of abnormal hypermetabolism. Incidental CT findings: No cervical adenopathy. Left maxillary sinus mucosal thickening. CHEST: No cervical nodal hypermetabolism. Hypermetabolism corresponding to the perifissural right upper lobe pulmonary nodule. 1.4 cm and a S.U.V. max of 9.2 on 105/3. Incidental CT findings: Deferred to recent screening CT. Aortic and coronary artery atherosclerosis. Centrilobular emphysema. ABDOMEN/PELVIS: No abdominopelvic parenchymal or nodal hypermetabolism. Incidental CT findings: Left adrenal thickening. Right adrenal 1.6 cm adenoma.  High left hepatic lobe subcentimeter cyst. Surgical changes of gastrojejunostomy. Mild prostatomegaly. Small fat containing right inguinal hernia. SKELETON: No abnormal marrow activity. Incidental CT findings: Degenerative changes of both hips. IMPRESSION: 1. Hypermetabolic posterior right upper lobe lung nodule, consistent with primary bronchogenic carcinoma. 2. No thoracic nodal or extrathoracic hypermetabolic metastasis. 3. Presuming non-small-cell histology, T1bN0M0 or stage IA. 4. Aortic atherosclerosis (ICD10-I70.0), coronary artery atherosclerosis and emphysema (ICD10-J43.9). 5. Right adrenal adenoma. Electronically Signed   By: Abigail Miyamoto M.D.   On: 01/05/2020 16:24   CT CHEST LUNG CA SCREEN LOW DOSE W/O CM  Result Date: 12/27/2019 CLINICAL DATA:  Former smoker, quit 8 years ago, 40 pack-year history. EXAM: CT CHEST WITHOUT CONTRAST  LOW-DOSE FOR LUNG CANCER SCREENING TECHNIQUE: Multidetector CT imaging of the chest was performed following the standard protocol without IV contrast. COMPARISON:  12/19/2018. FINDINGS: Cardiovascular: Atherosclerotic calcification of the aorta and coronary arteries. Heart size normal. No pericardial effusion. Mediastinum/Nodes: No pathologically enlarged mediastinal or axillary lymph nodes. Hilar regions are difficult to definitively evaluate without IV contrast but appear grossly unremarkable. Esophagus is grossly unremarkable. Lungs/Pleura: Mild centrilobular emphysema. Calcified granulomas. There is a new 15.7 mm nodule in the peripheral aspect of the posterior segment right upper lobe (9/120). Other noncalcified pulmonary nodules measure 3.9 mm or less in size. No pleural fluid. Airway is unremarkable. Upper Abdomen: Subcentimeter low-attenuation lesion in the dome of liver is too small to characterize. Fluid density nodule in the right adrenal gland measures 2.1 cm. Visualized portions of the left adrenal gland, kidneys, spleen and pancreas are otherwise unremarkable. Postoperative changes are seen in the distal stomach. No upper abdominal adenopathy. Musculoskeletal: Degenerative changes in the spine. No worrisome lytic or sclerotic lesions. IMPRESSION: 1. New 15.7 mm nodule in the posterior segment right upper lobe. Lung-RADS 4B, suspicious. Additional imaging evaluation or consultation with Pulmonology or Thoracic Surgery recommended. These results will be called to the ordering clinician or representative by the Radiologist Assistant, and communication documented in the PACS or zVision Dashboard. 2. Right adrenal adenoma. 3. Aortic atherosclerosis (ICD10-I70.0). Coronary artery calcification. 4.  Emphysema (ICD10-J43.9). Electronically Signed   By: Lorin Picket M.D.   On: 12/27/2019 13:10     I have independently reviewed the above radiology studies  and reviewed the findings with the patient.    Recent Lab Findings: Lab Results  Component Value Date   WBC 7.6 01/22/2020   HGB 12.3 (L) 01/22/2020   HCT 39.3 01/22/2020   PLT 214 01/22/2020   GLUCOSE 104 (H) 01/22/2020   ALT 22 01/22/2020   AST 17 01/22/2020   NA 139 01/22/2020   K 4.3 01/22/2020   CL 107 01/22/2020   CREATININE 0.87 01/22/2020   BUN 27 (H) 01/22/2020   CO2 20 (L) 01/22/2020   INR 1.0 01/22/2020   PFT's 01/09/2020 FEV1     2.58    83% DLCO   18.01  72%  Assessment / Plan:   #1 probable clinical stage Ia, T1N0M0 non-small cell lung cancer right upper lobe peripherally located #2 history of Billroth II in the setting of short segment Barrett's esophagus-followed for dysplasia most recent endoscopy December 2020 #3 history of coronary artery disease with extensive calcifications noted on CT, history of stent placement right coronary artery 1996-vague symptoms of exertionally related shortness of breath, he denies outright chest pain.  I reviewed with the patient and his wife the likely diagnosis considering the radiographic findings on CT and PET.  The most direct approach in  his treatment would be surgical resection of the right upper lobe lung mass and possible right upper lobectomy.  PFTs are adequate to tolerate resection.   Patient was reevaluated by cardiology last week and obtained cardiac clearance.    Discussed with patient and his wife the planned procedure of right chest robotic surgery, including biopsy and possible right upper lobectomy with node dissection.  Planned incisions, postop drainage tubes, typical postop course and recovery were all reviewed. The goals risks and alternatives of the planned surgical procedure Robotic approach to right chest with bronchoscopy right lung resection lymph node dissection have been discussed with the patient in detail. The risks of the procedure including death, infection, stroke, myocardial infarction, bleeding, blood transfusion have all been discussed  specifically.  I have quoted Daniel Colon a 2 % of perioperative mortality and a complication rate as high as 40 %. The patient's questions have been answered.Daniel Colon is willing  to proceed with the planned procedure.  Right side has need marked   Grace Isaac MD      [address] [place] [address] Office [phone]     01/24/2020 7:13 AM

## 2020-01-24 NOTE — Progress Notes (Signed)
      [address]       [place],[address] [address]             [phone]                 Day of Surgery Procedure(s) (LRB): VIDEO BRONCHOSCOPY (N/A) XI ROBOTIC ASSISTED THORASCOPY-LUNG WEDGE RESECTION AND RIGHT UPPER  LOBE LOBECTOMY (Right) LYMPH NODE DISSECTION (N/A) Intercostal Nerve Block (Right)  LOS: 0 days   Subjective: Extubated in RR, waking mind ly combative   Objective: Vital signs in last 24 hours: Patient Vitals for the past 24 hrs:  BP Temp Temp src Pulse Resp SpO2 Height Weight  01/24/20 1900 -- (P) 98.2 F (36.8 C) -- -- -- -- -- --  01/24/20 0640 (!) 135/57 98.5 F (36.9 C) Oral (!) 54 19 99 % 5\' 8"  (1.727 m) 98.8 kg    Filed Weights   01/24/20 0640  Weight: 98.8 kg    Hemodynamic parameters for last 24 hours:    Intake/Output from previous day: No intake/output data recorded. Intake/Output this shift: No intake/output data recorded.  Scheduled Meds: . sodium chloride   Intravenous Once  . bupivacaine liposome  20 mL Infiltration Once  . dexmedetomidine      . fentaNYL   Intravenous Q4H   Continuous Infusions: .  ceFAZolin (ANCEF) IV     PRN Meds:.diphenhydrAMINE **OR** diphenhydrAMINE, fentaNYL (SUBLIMAZE) injection, naloxone **AND** sodium chloride flush, ondansetron (ZOFRAN) IV, ondansetron (ZOFRAN) IV    Lab Results: CBC: Recent Labs    01/22/20 1335  WBC 7.6  HGB 12.3*  HCT 39.3  PLT 214   BMET:  Recent Labs    01/22/20 1335  NA 139  K 4.3  CL 107  CO2 20*  GLUCOSE 104*  BUN 27*  CREATININE 0.87  CALCIUM 8.9    PT/INR:  Recent Labs    01/22/20 1335  LABPROT 13.3  INR 1.0   +1 air leak, apical space present on RR xray   Radiology No results found.   Assessment/Plan: S/P Procedure(s) (LRB): VIDEO BRONCHOSCOPY (N/A) XI ROBOTIC ASSISTED THORASCOPY-LUNG WEDGE RESECTION AND RIGHT UPPER  LOBE LOBECTOMY (Right) LYMPH NODE DISSECTION (N/A) Intercostal Nerve Block (Right)  With waking up somewhat  combative will observe in ICU   Grace Isaac MD 01/24/2020 7:25 PM

## 2020-01-24 NOTE — Anesthesia Procedure Notes (Signed)
Procedure Name: Intubation Date/Time: 01/24/2020 8:57 AM Performed by: Amadeo Garnet, CRNA Pre-anesthesia Checklist: Patient identified, Emergency Drugs available, Suction available, Patient being monitored and Timeout performed Patient Re-evaluated:Patient Re-evaluated prior to induction Oxygen Delivery Method: Circle system utilized Preoxygenation: Pre-oxygenation with 100% oxygen Induction Type: IV induction Ventilation: Mask ventilation without difficulty Laryngoscope Size: Mac and 4 Grade View: Grade I Endobronchial tube: Left and Double lumen EBT and 39 Fr Number of attempts: 1 Airway Equipment and Method: Stylet and Fiberoptic brochoscope Placement Confirmation: ETT inserted through vocal cords under direct vision,  positive ETCO2 and breath sounds checked- equal and bilateral Tube secured with: Tape Dental Injury: Teeth and Oropharynx as per pre-operative assessment

## 2020-01-24 NOTE — Anesthesia Procedure Notes (Signed)
Central Venous Catheter Insertion Performed by: Murvin Natal, MD, anesthesiologist Start/End2/02/2020 7:45 AM, 01/24/2020 8:00 AM Patient location: Pre-op. Preanesthetic checklist: patient identified, IV checked, site marked, risks and benefits discussed, surgical consent, monitors and equipment checked, pre-op evaluation, timeout performed and anesthesia consent Position: Trendelenburg Lidocaine 1% used for infiltration and patient sedated Hand hygiene performed , maximum sterile barriers used  and Seldinger technique used Catheter size: 8 Fr Total catheter length 16. Central line was placed.Double lumen Procedure performed using ultrasound guided technique. Ultrasound Notes:anatomy identified, needle tip was noted to be adjacent to the nerve/plexus identified, no ultrasound evidence of intravascular and/or intraneural injection and image(s) printed for medical record Attempts: 1 Following insertion, dressing applied, line sutured and Biopatch. Post procedure assessment: blood return through all ports, free fluid flow and no air  Patient tolerated the procedure well with no immediate complications.

## 2020-01-24 NOTE — Discharge Instructions (Signed)
ACTIVITY:  1.Increase activity slowly. 2.Walk daily and increase frequency and duration as tolerates. 3.May walk up steps. 4.No lifting more than ten pounds for two weeks. 5.No driving for two weeks. 6.Avoid straining. 7.STOP any activity that causes chest pain, shortness of breath, dizziness,sweating,     or excessive weakness. 8.Continue with breathing exercises daily.  DIET:  Heart healthy   WOUND:  1.May shower. 2.Clean wounds with mild soap and water.  Call the office at [phone] if any problems arise.  

## 2020-01-24 NOTE — Anesthesia Postprocedure Evaluation (Signed)
Anesthesia Post Note  Patient: Daniel Colon  Procedure(s) Performed: VIDEO BRONCHOSCOPY (N/A ) XI ROBOTIC ASSISTED THORASCOPY-LUNG WEDGE RESECTION AND RIGHT UPPER  LOBE LOBECTOMY (Right Chest) LYMPH NODE DISSECTION (N/A ) Intercostal Nerve Block (Right )     Patient location during evaluation: PACU Anesthesia Type: General Level of consciousness: oriented and confused Pain management: pain level controlled Vital Signs Assessment: post-procedure vital signs reviewed and stable Respiratory status: spontaneous breathing, nonlabored ventilation, respiratory function stable and patient connected to nasal cannula oxygen Cardiovascular status: blood pressure returned to baseline and stable Postop Assessment: no apparent nausea or vomiting Anesthetic complications: no    Last Vitals:  Vitals:   01/24/20 2106 01/24/20 2200  BP:  (!) 115/57  Pulse:  89  Resp:  15  Temp:    SpO2: 96% 93%    Last Pain:  Vitals:   01/24/20 2031  TempSrc:   PainSc: 4                  Leonides Minder COKER

## 2020-01-24 NOTE — Anesthesia Procedure Notes (Signed)
Arterial Line Insertion Start/End2/02/2020 7:30 AM, 01/24/2020 7:35 AM Performed by: Amadeo Garnet, CRNA, CRNA  Patient location: Pre-op. Preanesthetic checklist: patient identified, IV checked, site marked, risks and benefits discussed, surgical consent, monitors and equipment checked, pre-op evaluation, timeout performed and anesthesia consent Lidocaine 1% used for infiltration and patient sedated Left, radial was placed Catheter size: 20 G Hand hygiene performed  and maximum sterile barriers used   Attempts: 1 Procedure performed without using ultrasound guided technique. Following insertion, dressing applied and Biopatch. Post procedure assessment: normal  Patient tolerated the procedure well with no immediate complications.

## 2020-01-24 NOTE — Brief Op Note (Addendum)
01/24/2020  6:43 PM  PATIENT:  Daniel Colon  74 y.o. male  PRE-OPERATIVE DIAGNOSIS:  PULMONARY NODULE,  Subpleural nodule in the right upper lobe  POST-OPERATIVE DIAGNOSIS:  PULMONARY NODULE, malignant right upper lobe mass    PROCEDURE:  Procedure(s): VIDEO BRONCHOSCOPY (N/A) XI ROBOTIC ASSISTED THORASCOPY-LUNG WEDGE RESECTION AND RIGHT UPPER  LOBE LOBECTOMY (Right) LYMPH NODE DISSECTION (N/A) Intercostal Nerve Block (Right)  SURGEON:  Surgeon(s) and Role:    * Grace Isaac, MD - Primary    * Lightfoot, Lucile Crater, MD - Assisting  PHYSICIAN ASSISTANT: WAYNE GOLD PA-C, MYRON RODDENBERRY PA-C, TESSA CONTE PA-C   ANESTHESIA:   general  EBL:  100 mL   BLOOD ADMINISTERED:none  DRAINS: 1 28 F  Chest Tube(s) in the RIGHT HEMITHORAX   LOCAL MEDICATIONS USED:  OTHER: EXPAREL  SPECIMEN:  Source of Specimen:  RUL AND MULTIPLE LN SAMPLES  DISPOSITION OF SPECIMEN:  PATHOLOGY  COUNTS:  YES   DICTATION: .Other Dictation: Dictation Number PENDING  PLAN OF CARE: Admit to inpatient   PATIENT DISPOSITION:  PACU - hemodynamically stable.   Delay start of Pharmacological VTE agent (>24hrs) due to surgical blood loss or risk of bleeding: yes  COMPLICATIONS: NO KNOWN

## 2020-01-24 NOTE — Transfer of Care (Signed)
Immediate Anesthesia Transfer of Care Note  Patient: Daniel Colon  Procedure(s) Performed: VIDEO BRONCHOSCOPY (N/A ) XI ROBOTIC ASSISTED THORASCOPY-LUNG WEDGE RESECTION AND RIGHT UPPER  LOBE LOBECTOMY (Right Chest) LYMPH NODE DISSECTION (N/A ) Intercostal Nerve Block (Right )  Patient Location: PACU  Anesthesia Type:General  Level of Consciousness: pateint uncooperative  Airway & Oxygen Therapy: Patient Spontanous Breathing and Patient connected to face mask oxygen  Post-op Assessment: Report given to RN, Post -op Vital signs reviewed and stable and Patient moving all extremities  Post vital signs: Reviewed and stable  Last Vitals:  Vitals Value Taken Time  BP 152/66 01/24/20 1854  Temp    Pulse 81 01/24/20 1858  Resp 24 01/24/20 1858  SpO2 99 % 01/24/20 1858  Vitals shown include unvalidated device data.  Last Pain:  Vitals:   01/24/20 0640  TempSrc: Oral  PainSc: 0-No pain      Patients Stated Pain Goal: 3 (76/19/50 [id number])  Complications: No apparent anesthesia complications

## 2020-01-25 ENCOUNTER — Inpatient Hospital Stay (HOSPITAL_COMMUNITY): Payer: Medicare Other

## 2020-01-25 LAB — BASIC METABOLIC PANEL
Anion gap: 9 (ref 5–15)
BUN: 18 mg/dL (ref 8–23)
CO2: 25 mmol/L (ref 22–32)
Calcium: 8.1 mg/dL — ABNORMAL LOW (ref 8.9–10.3)
Chloride: 104 mmol/L (ref 98–111)
Creatinine, Ser: 1.04 mg/dL (ref 0.61–1.24)
GFR calc Af Amer: >60 mL/min (ref >60)
GFR calc non Af Amer: >60 mL/min (ref >60)
Glucose, Bld: 151 mg/dL — ABNORMAL HIGH (ref 70–99)
Potassium: 4.2 mmol/L (ref 3.5–5.1)
Sodium: 138 mmol/L (ref 135–145)

## 2020-01-25 LAB — CBC
HCT: 34.1 % — ABNORMAL LOW (ref 39.0–52.0)
Hemoglobin: 11 g/dL — ABNORMAL LOW (ref 13.0–17.0)
MCH: 27.4 pg (ref 26.0–34.0)
MCHC: 32.3 g/dL (ref 30.0–36.0)
MCV: 84.8 fL (ref 80.0–100.0)
Platelets: 189 10*3/uL (ref 150–400)
RBC: 4.02 MIL/uL — ABNORMAL LOW (ref 4.22–5.81)
RDW: 14.6 % (ref 11.5–15.5)
WBC: 9.7 10*3/uL (ref 4.0–10.5)
nRBC: 0 % (ref 0.0–0.2)

## 2020-01-25 LAB — POCT I-STAT 7, (LYTES, BLD GAS, ICA,H+H)
Bicarbonate: 24.7 mmol/L (ref 20.0–28.0)
Bicarbonate: 24 mmol/L (ref 20.0–28.0)
Calcium, Ion: 1.11 mmol/L — ABNORMAL LOW (ref 1.15–1.40)
Calcium, Ion: 1.16 mmol/L (ref 1.15–1.40)
HCT: 28 % — ABNORMAL LOW (ref 39.0–52.0)
HCT: 28 % — ABNORMAL LOW (ref 39.0–52.0)
Hemoglobin: 9.5 g/dL — ABNORMAL LOW (ref 13.0–17.0)
Hemoglobin: 9.5 g/dL — ABNORMAL LOW (ref 13.0–17.0)
O2 Saturation: 97 %
O2 Saturation: 95 %
Patient temperature: 99.3
Patient temperature: 98
Potassium: 3.8 mmol/L (ref 3.5–5.1)
Potassium: 3.6 mmol/L (ref 3.5–5.1)
Sodium: 139 mmol/L (ref 135–145)
Sodium: 138 mmol/L (ref 135–145)
TCO2: 26 mmol/L (ref 22–32)
TCO2: 25 mmol/L (ref 22–32)
pCO2 arterial: 38.5 mmHg (ref 32.0–48.0)
pCO2 arterial: 35.2 mmHg (ref 32.0–48.0)
pH, Arterial: 7.417 (ref 7.350–7.450)
pH, Arterial: 7.439 (ref 7.350–7.450)
pO2, Arterial: 90 mmHg (ref 83.0–108.0)
pO2, Arterial: 69 mmHg — ABNORMAL LOW (ref 83.0–108.0)

## 2020-01-25 MED ORDER — CHLORHEXIDINE GLUCONATE CLOTH 2 % EX PADS
6.0000 | MEDICATED_PAD | Freq: Every day | CUTANEOUS | Status: DC
  Administered 2020-01-25 – 2020-01-30 (×5): 6 via TOPICAL

## 2020-01-25 MED ORDER — SODIUM CHLORIDE 0.9% FLUSH: 10.0000 mL | INTRAVENOUS | Status: DC | PRN

## 2020-01-25 MED ORDER — ENOXAPARIN SODIUM 40 MG/0.4ML ~~LOC~~ SOLN
40.0000 mg | SUBCUTANEOUS | Status: DC
  Administered 2020-01-25 – 2020-01-31 (×7): 40 mg via SUBCUTANEOUS
  Filled 2020-01-25 (×7): qty 0.4

## 2020-01-25 MED ORDER — POTASSIUM CHLORIDE CRYS ER 20 MEQ PO TBCR
20.0000 meq | EXTENDED_RELEASE_TABLET | Freq: Once | ORAL | Status: AC
  Administered 2020-01-25: 08:00:00 20 meq via ORAL
  Filled 2020-01-25: qty 1

## 2020-01-25 MED ORDER — SODIUM CHLORIDE 0.9% FLUSH
10.0000 mL | Freq: Two times a day (BID) | INTRAVENOUS | Status: DC
  Administered 2020-01-25 – 2020-01-26 (×3): 10 mL
  Administered 2020-01-26: 20 mL
  Administered 2020-01-27 – 2020-01-31 (×9): 10 mL

## 2020-01-25 MED ORDER — OXYCODONE HCL 5 MG PO TABS: 5.0000 mg | ORAL_TABLET | ORAL | Status: DC | PRN

## 2020-01-25 MED ORDER — LEVALBUTEROL HCL 0.63 MG/3ML IN NEBU
0.6300 mg | INHALATION_SOLUTION | Freq: Three times a day (TID) | RESPIRATORY_TRACT | Status: DC
  Administered 2020-01-25 – 2020-01-26 (×4): 0.63 mg via RESPIRATORY_TRACT
  Filled 2020-01-25 (×4): qty 3

## 2020-01-25 MED ORDER — TRAMADOL HCL 50 MG PO TABS: 50.0000 mg | ORAL_TABLET | Freq: Four times a day (QID) | ORAL | Status: DC | PRN

## 2020-01-25 NOTE — Evaluation (Signed)
Physical Therapy Evaluation Patient Details Name: Daniel Colon MRN: [id number] DOB: March 31, 1946 Today's Date: 01/25/2020   History of Present Illness  74 y.o. male with PMH significant for CAD, Barrett's esophagus, cataracts, GERD, HTN, MI. Pt with RUL long lesion of increasing size, now s/p Thoracoscopy with lung wedge resection, RUL lobectomy, and lymph node dissection.  Clinical Impression  Pt presents to PT with deficits in activity tolerance and cardiopulmonary function. Pt ambulating and transferring independently with god quality, however limited by pulmonary function at this time. Pt will continue to benefit from aggressive mobilization to improve pulmonary function and restore his prior level of activity tolerance. PT will continue to follow with this patient to ensure return to baseline and improved activity tolerance.    Follow Up Recommendations No PT follow up    Equipment Recommendations  None recommended by PT    Recommendations for Other Services       Precautions / Restrictions Precautions Precautions: Other (comment) Precaution Comments: wathc SpO2 Restrictions Weight Bearing Restrictions: No      Mobility  Bed Mobility Overal bed mobility: (pt received and left sitting up in recliner)                Transfers Overall transfer level: Independent                  Ambulation/Gait Ambulation/Gait assistance: Independent Gait Distance (Feet): 400 Feet Assistive device: None Gait Pattern/deviations: Step-through pattern Gait velocity: functional Gait velocity interpretation: >2.62 ft/sec, indicative of community ambulatory General Gait Details: pt with steady step through gait, able to change gait speed without LOB  Stairs            Wheelchair Mobility    Modified Rankin (Stroke Patients Only)       Balance Overall balance assessment: Independent                                           Pertinent Vitals/Pain  Pain Assessment: Faces Faces Pain Scale: Hurts even more Pain Location: sore Pain Descriptors / Indicators: Grimacing Pain Intervention(s): Limited activity within patient's tolerance    Home Living Family/patient expects to be discharged to:: Private residence Living Arrangements: Spouse/significant other Available Help at Discharge: Family;Available 24 hours/day Type of Home: House Home Access: Stairs to enter Entrance Stairs-Rails: Can reach both Entrance Stairs-Number of Steps: 3 Home Layout: One level Home Equipment: Grab bars - tub/shower      Prior Function Level of Independence: Independent         Comments: avid golfer     Hand Dominance   Dominant Hand: Right    Extremity/Trunk Assessment   Upper Extremity Assessment Upper Extremity Assessment: Overall WFL for tasks assessed    Lower Extremity Assessment Lower Extremity Assessment: Overall WFL for tasks assessed    Cervical / Trunk Assessment Cervical / Trunk Assessment: Normal  Communication   Communication: No difficulties  Cognition Arousal/Alertness: Awake/alert Behavior During Therapy: WFL for tasks assessed/performed Overall Cognitive Status: Within Functional Limits for tasks assessed                                        General Comments General comments (skin integrity, edema, etc.): SpO2 desaturating into high 70s and low 80s during ambulation, unreliable pleth reading but pt with noted  increased work of breathing. Pt ambulating on RA, returned to 1L [place] with quick return to 94%    Exercises     Assessment/Plan    PT Assessment Patient needs continued PT services  PT Problem List Decreased activity tolerance;Cardiopulmonary status limiting activity       PT Treatment Interventions Gait training;Stair training;Patient/family education    PT Goals (Current goals can be found in the Care Plan section)  Acute Rehab PT Goals Patient Stated Goal: To go home and get back  to golfing PT Goal Formulation: With patient Time For Goal Achievement: 02/08/20 Potential to Achieve Goals: Good Additional Goals Additional Goal #1: Pt will maintain dynamic standing balance within 10 inches of his base of support independently    Frequency Min 2X/week   Barriers to discharge        Co-evaluation               AM-PAC PT "6 Clicks" Mobility  Outcome Measure Help needed turning from your back to your side while in a flat bed without using bedrails?: None Help needed moving from lying on your back to sitting on the side of a flat bed without using bedrails?: None Help needed moving to and from a bed to a chair (including a wheelchair)?: None Help needed standing up from a chair using your arms (e.g., wheelchair or bedside chair)?: None Help needed to walk in hospital room?: None Help needed climbing 3-5 steps with a railing? : None 6 Click Score: 24    End of Session Equipment Utilized During Treatment: Oxygen Activity Tolerance: Patient tolerated treatment well Patient left: in chair;with call bell/phone within reach Nurse Communication: Mobility status PT Visit Diagnosis: Other (comment)(cardiopulmonary function)    Time: 4917-9150 PT Time Calculation (min) (ACUTE ONLY): 21 min   Charges:   PT Evaluation $PT Eval Moderate Complexity: 1 Mod          Zenaida Niece, PT, DPT Acute Rehabilitation Pager: [phone]   Zenaida Niece 01/25/2020, 1:42 PM

## 2020-01-25 NOTE — Plan of Care (Signed)

## 2020-01-25 NOTE — Progress Notes (Signed)
Removed pts ETCO2 [place] at this time per pt request. Pt has not used their PCA in last 8 hours.

## 2020-01-25 NOTE — Op Note (Signed)
Daniel Colon, Daniel Colon MEDICAL RECORD [id number] ACCOUNT [id number] DATE OF BIRTH:01/15/1946 FACILITY: [facility] LOCATION: [facility] PHYSICIAN:Nona Gracey B. Servando Snare, MD  OPERATIVE REPORT  DATE OF PROCEDURE:  01/24/2020  PREOPERATIVE DIAGNOSIS:  A 2 cm right upper lobe lung mass found on surveillance lung cancer screening CT.  POSTOPERATIVE DIAGNOSES:   1.  Malignant neoplasm, right upper lobe, 2.  Sarcomatous changes on frozen section.  PROCEDURES PERFORMED:   1.  Bronchoscopy.   2.  Robotic-assisted wedge resection- right upper lobe with completion right upper lobe lobectomy, lymph node dissection, and intercostal nerve block.  SURGEON:  Lanelle Bal, MD  FIRST ASSISTANT:  Dr. Kipp Brood.    SECOND ASSISTANTS:   Jadene Pierini, PA and [place], [place].  BRIEF HISTORY:  The patient is a 74 year old male with previous smoking history, adequate pulmonary reserve for resection with distant coronary stents, who presented with new finding of 1.5 cm solid hypermetabolic mass in the right upper lobe adjacent to  the posterior major fissure without evidence of mediastinal nodal enlargement or positivity on PET scan.  On review of the patient's previous yearly CT scans, this was documented a new lesion.  We recommended proceeding with surgical resection.  The  patient was agreeable and signed informed consent.  DESCRIPTION OF PROCEDURE:  The patient was brought to the operating room with central line and arterial line in place.  He underwent general endotracheal anesthesia with a double lumen endotracheal tube.  Appropriate timeout was performed and we  performed bronchoscopy to the subsegmental level, confirming the proper position of the double lumen endotracheal tube and without evidence of endobronchial lesions.  The scope was removed.  The patient was then turned in lateral decubitus position with  the right side up.  Preoperative marking was performed and confirmed.  Planned robotic port sites were  marked.  The right chest was then prepped with Betadine and draped in a sterile manner.  A second timeout was performed and then we proceeded with  placement of robotic ports, approximately the 8th intercostal space, midaxillary line.  We infiltrated piperocaine and then placed an 8 mm port into the chest and started insufflation.  The 8 mm scope was then placed into the chest.  The lung did  collapse adequately well, but it was apparent there were significant adhesions throughout the chest.  We then under direct vision with the scope in place, placed an anterior 12 mm port under direct vision approximately 1 interspace higher.  Switching the  camera and using a Covidien ligature, we took down adhesions posteriorly to better see port placement.  This took [place] some time to accomplish, but with good visualization, we then placed 2 additional ports along the same intercostal space, placing a 12  and an 8 robotic port.  An accessory 12 mm port was then placed more anteriorly below the camera port and the most anterior port right at the diaphragm.  We then also under direct vision, injected through the skin posteriorly along each intercostal space  approximately 10 mL of Exparel, piperocaine, saline solution as we had done at the port sites.  We then moved the [employer] SI robot into place,  targeted our lesion and docked the device.  We then proceeded primarily posteriorly, taking down additional  adhesions.  There were adhesions along the major fissure to the chest wall.  These did not appear to be tumor.  The major fissure was partially complete.  With this dissection, we were able to mobilize a portion of the  right upper lobe and was easily  identifiable area involving the upper lobe posterior mass, but not crossing the fissure.  We then used multiple firings of blue load staples to perform a wedge resection of the mass, taking a portion of the lower lobe that was adhesed.  In this process,  a small portion of  the blue vessel loop was caught in the suture line and we felt that it was not clinically significant and did not re-resect that area in the lower lobe.  The specimen was placed in a bag.  While waiting for the frozen section,  proceeded with takedown of adhesions, especially posteriorly and apically.  There were also extensive adhesions anteriorly to the middle lobe and upper lobe.  With this tedious dissection, we were able to expose the posterior sulcus and began dissecting  out the posterior mediastinum.  The lower lobe was densely adhered to the diaphragm, so it was not completely dissected free.  We continued our dissection posteriorly, identifying the bronchus intermedius and the upper lobe bronchus, removing level 7  nodes in this area.  With the posterior mediastinum dissected, we continued superiorly, taking adhesions off the azygos vein to the lung.  With this dissection, we were able to identify the pulmonary artery branches.  As we moved anteriorly, an easily  dissectible branch of the pulmonary vein to the upper lobe was encircled.  However, due to adhesions, it was more difficult to discern the middle lobe pulmonary vein branches.  To further be able to mobilize the lung, we moved anteriorly and took down  adhesions from the middle lobe from the chest wall and pericardium, taking care to identify the phrenic nerve.  At this point, moving anterior, posterior resection appeared due to adhesions to be more difficult, so we decided to move back along the  fissure and move with division of vessels and bronchus posterior to anterior.  We dissected along the major fissure, getting into the plane along the pulmonary artery.  With this dissection, we moved posteriorly and were able to identify the takeoff of  the superior segmental artery to the lower lobe and also the ascending branch to the upper lobe.  As we worked through this, we completed the fissure posteriorly with green loads as it was somewhat  thick tissue.  With the posterior fissure open, we  continued our dissection, encircling the posterior descending branch with a vessel loop and dividing with a gray load vascular stapler.  This created more freedom to rotate the upper lobe more anteriorly and then encircle the upper lobe bronchus.  With  this dissected free, we then placed a black load stapler and clamped on the upper lobe bronchus.  Test inflation confirmed our anatomy.  With inflation of the middle and lower lobe, the bronchus was divided and further dissection moving posterior to  anterior, we identified a second pulmonary artery branch, divided and then the truncus branch.  Each of these were divided with white load vascular stapler.  As we continued more anteriorly with the arterial branches to the upper lobe divided and the  bronchus divided, we completed division of the remaining pulmonary vein branch, preserving the middle lobe pulmonary vein branch with a gray load stapler.  The fissure between the middle and upper lobe was then completed with serial firings of a black  load stapler.  We then tested for bronchial air leak and with none, placed the specimen in a bag.  As our dissection had proceeded, we submitted  multiple lymph nodes, each labeled according to station for permanent section.  We then undocked the robot,  used our most anterior port, enlarged slightly and were able to retrieve the specimen in a specimen bag through this incision.  A 28 chest tube was placed to the apex and secured in place through the accessory port site.  With the camera in place, each  port site was inspected for bleeding.  The camera was removed and the ports were each removed and closed with interrupted Vicryl suture in the deep muscle layers and subcuticular stitch in skin edges.  Dermabond was applied.  The right lung was  ventilated.  The patient had 1+ air leak with ventilation.  With the sponge and needle count reported as correct, the patient  was turned back on his back, weaned from ventilation and extubated in the operating room.  Estimated blood loss was 100 mL.  The  patient did not require any blood bank blood products.  With extubation, he was transferred to the recovery room for postoperative observation, having tolerated the procedure without obvious complication.  VN/NUANCE  D:01/25/2020 T:01/25/2020 JOB:[id number]

## 2020-01-25 NOTE — Progress Notes (Addendum)
[facility] DAILY ICU PROGRESS NOTE                   [address]            [place] [address]          [phone]   1 Day Post-Op Procedure(s) (LRB): VIDEO BRONCHOSCOPY (N/A) XI ROBOTIC ASSISTED THORASCOPY-LUNG WEDGE RESECTION AND RIGHT UPPER  LOBE LOBECTOMY (Right) LYMPH NODE DISSECTION (N/A) Intercostal Nerve Block (Right)  Total Length of Stay:  LOS: 1 day   Subjective: Patient sitting in chair alert and oriented. His only complaint is pain at the back of the left side of his neck.  Objective: Vital signs in last 24 hours: Temp:  [98.2 F (36.8 C)-98.4 F (36.9 C)] 98.2 F (36.8 C) (02/04 0400) Pulse Rate:  [79-92] 85 (02/04 0600) Cardiac Rhythm: Normal sinus rhythm (02/04 0000) Resp:  [15-34] 25 (02/04 0600) BP: (98-159)/(53-109) 104/56 (02/04 0600) SpO2:  [93 %-99 %] 97 % (02/04 0600) Arterial Line BP: (100-167)/(39-73) 124/40 (02/04 0600) Weight:  [94.2 kg] 94.2 kg (02/04 0600)  Filed Weights   01/24/20 0640 01/25/20 0600  Weight: 98.8 kg 94.2 kg    Weight change: -4.602 kg      Intake/Output from previous day: 02/03 0701 - 02/04 0700 In: 4725.5 [I.V.:4325.5; IV Piggyback:400.1] Out: 2765 [Urine:2185; Blood:100; Chest Tube:480]  Intake/Output this shift: No intake/output data recorded.  Current Meds: Scheduled Meds: . acetaminophen  1,000 mg Oral Q6H   Or  . acetaminophen (TYLENOL) oral liquid 160 mg/5 mL  1,000 mg Oral Q6H  . [START ON 01/26/2020] aspirin EC  81 mg Oral Once per day on Mon Wed Fri  . atorvastatin  40 mg Oral Daily  . bisacodyl  10 mg Oral Daily  . carvedilol  12.5 mg Oral Daily  . Chlorhexidine Gluconate Cloth  6 each Topical Daily  . ezetimibe  10 mg Oral Daily  . fentaNYL   Intravenous Q4H  . hydrochlorothiazide  12.5 mg Oral Daily   And  . irbesartan  300 mg Oral Daily  . ipratropium  1 spray Each Nare Daily  . levalbuterol  0.63 mg Nebulization Q6H  . oxybutynin  5 mg Oral Daily  . pantoprazole  40 mg Oral  Daily  . senna-docusate  1 tablet Oral QHS  . sodium chloride flush  10-40 mL Intracatheter Q12H   Continuous Infusions: . sodium chloride    . sodium chloride    .  ceFAZolin (ANCEF) IV 2 g (01/25/20 8182)  . dextrose 5 % and 0.9% NaCl Stopped (01/25/20 0351)   PRN Meds:.Place/Maintain arterial line **AND** sodium chloride, acetaminophen, diphenhydrAMINE **OR** diphenhydrAMINE, naloxone **AND** sodium chloride flush, ondansetron (ZOFRAN) IV, ondansetron (ZOFRAN) IV, oxyCODONE, sodium chloride flush, traMADol  General appearance: alert, cooperative and no distress Neurologic: Intact, twitching of upper and lower extremities Heart: RRR Lungs: Clear to auscultation on the left and diminished on the right Abdomen: Soft, non tender, sporadic bowel sounds present Extremities: Trace LE edema Wound: Dressing is clean and dry. Back of left neck has some erythema and is tender (? from positioning)  Lab Results: CBC: Recent Labs    01/22/20 1335 01/24/20 1416 01/25/20 0205 01/25/20 0205 01/25/20 0316 01/25/20 0407  WBC 7.6  --  9.7  --   --   --   HGB 12.3*   < > 11.0*   < > 9.5* 9.5*  HCT 39.3   < > 34.1*   < > 28.0*  28.0*  PLT 214  --  189  --   --   --    < > = values in this interval not displayed.   BMET:  Recent Labs    01/22/20 1335 01/24/20 1416 01/25/20 0205 01/25/20 0205 01/25/20 0316 01/25/20 0407  NA 139   < > 138   < > 139 138  K 4.3   < > 4.2   < > 3.8 3.6  CL 107  --  104  --   --   --   CO2 20*  --  25  --   --   --   GLUCOSE 104*  --  151*  --   --   --   BUN 27*  --  18  --   --   --   CREATININE 0.87  --  1.04  --   --   --   CALCIUM 8.9  --  8.1*  --   --   --    < > = values in this interval not displayed.    CMET: Lab Results  Component Value Date   WBC 9.7 01/25/2020   HGB 9.5 (L) 01/25/2020   HCT 28.0 (L) 01/25/2020   PLT 189 01/25/2020   GLUCOSE 151 (H) 01/25/2020   ALT 22 01/22/2020   AST 17 01/22/2020   NA 138 01/25/2020   K 3.6  01/25/2020   CL 104 01/25/2020   CREATININE 1.04 01/25/2020   BUN 18 01/25/2020   CO2 25 01/25/2020   INR 1.0 01/22/2020    PT/INR:  Recent Labs    01/22/20 1335  LABPROT 13.3  INR 1.0   Radiology: [facility] Chest Port 1 View  Result Date: 01/24/2020 CLINICAL DATA:  Status post lobectomy.  Chest tube in place. EXAM: PORTABLE CHEST 1 VIEW COMPARISON:  01/22/2020 FINDINGS: The patient is status post lobectomy. A right-sided chest tube is in place. There is a large right-sided pneumothorax. There is a right-sided central venous catheter with tip projecting over the cavoatrial junction. Subcutaneous gas is noted along the patient's right flank. The heart size is normal. Aortic calcifications are noted. IMPRESSION: 1. Lines and tubes as above. 2. Large right-sided pneumothorax despite chest tube placement. 3. Postsurgical changes related to recent lobectomy. These results will be called to the ordering clinician or representative by the Radiologist Assistant, and communication documented in the PACS or zVision Dashboard. Electronically Signed   By: Constance Holster M.D.   On: 01/24/2020 19:41     Assessment/Plan: S/P Procedure(s) (LRB): VIDEO BRONCHOSCOPY (N/A) XI ROBOTIC ASSISTED THORASCOPY-LUNG WEDGE RESECTION AND RIGHT UPPER  LOBE LOBECTOMY (Right) LYMPH NODE DISSECTION (N/A) Intercostal Nerve Block (Right)   1. CV-HR in the 80's. On Coreg 12.5 mg daily, HCTZ 12.5 mg daily, and Irbesartan 300 mg daily. SBP in the low 100's. Will stop Irbesartan. 2. Pulmonary-ABG results noted. On 2 liters of oxygen via [place]. Will wean to room air over next few days. Chest tube with 480 cc of output since surgery. Chest tube is to water seal and there is a +++ air leak with cough. CXR this am shows subcutaneous emphysema right later chest wall and fairly large space on the right.  As discussed with Dr. Servando Snare, chest tube to remain to water seal for now. Encourage incentive spirometer and flutter valve.  3.  Lovenox for DVT prophylaxis 4. Remove a line and foley 5. Advance diet and decrease IVF 6. Anemia-H and H this am 11 and 34.1  7. Supplement potassium 8. Regarding his upper and lower extremity twitching, he could be seen by neurology as an outpatient. Unsure of etiology of erythema and tenderness of back left side of his neck (in the shape of a partial J). Will continue to monitor. 9. Transfer to [place] PA-C 01/25/2020 7:02 AM   Patient seen this morning, alert awake sitting in the chair.  Says he is comfortable, notes that he has not used his PCA pump We will DC Foley and A-line Start ambulation Patient has apical space on the right, air leak with cough, leave chest tube currently Patient notes some soreness left posterior neck, not associated with more anterior vessels such as the jugular vein.-We will monitor Initially woke from anesthesia somewhat agitated and was monitored in ICU overnight plan transfer to TCU today Continue beta-blocker, hold ARB I have seen and examined Daniel Colon and agree with the above assessment  and plan.  Grace Isaac MD [facility] [phone] Office [phone] 01/25/2020 8:58 AM

## 2020-01-26 ENCOUNTER — Inpatient Hospital Stay (HOSPITAL_COMMUNITY): Payer: Medicare Other

## 2020-01-26 ENCOUNTER — Other Ambulatory Visit: Payer: Self-pay

## 2020-01-26 LAB — COMPREHENSIVE METABOLIC PANEL
ALT: 30 U/L (ref 0–44)
AST: 63 U/L — ABNORMAL HIGH (ref 15–41)
Albumin: 3 g/dL — ABNORMAL LOW (ref 3.5–5.0)
Alkaline Phosphatase: 45 U/L (ref 38–126)
Anion gap: 8 (ref 5–15)
BUN: 15 mg/dL (ref 8–23)
CO2: 28 mmol/L (ref 22–32)
Calcium: 8.3 mg/dL — ABNORMAL LOW (ref 8.9–10.3)
Chloride: 105 mmol/L (ref 98–111)
Creatinine, Ser: 0.87 mg/dL (ref 0.61–1.24)
GFR calc Af Amer: >60 mL/min (ref >60)
GFR calc non Af Amer: >60 mL/min (ref >60)
Glucose, Bld: 122 mg/dL — ABNORMAL HIGH (ref 70–99)
Potassium: 3.6 mmol/L (ref 3.5–5.1)
Sodium: 141 mmol/L (ref 135–145)
Total Bilirubin: 0.7 mg/dL (ref 0.3–1.2)
Total Protein: 5 g/dL — ABNORMAL LOW (ref 6.5–8.1)

## 2020-01-26 LAB — CBC
HCT: 32.4 % — ABNORMAL LOW (ref 39.0–52.0)
Hemoglobin: 10.1 g/dL — ABNORMAL LOW (ref 13.0–17.0)
MCH: 26.6 pg (ref 26.0–34.0)
MCHC: 31.2 g/dL (ref 30.0–36.0)
MCV: 85.5 fL (ref 80.0–100.0)
Platelets: 162 K/uL (ref 150–400)
RBC: 3.79 MIL/uL — ABNORMAL LOW (ref 4.22–5.81)
RDW: 15 % (ref 11.5–15.5)
WBC: 8 K/uL (ref 4.0–10.5)
nRBC: 0 % (ref 0.0–0.2)

## 2020-01-26 MED ORDER — POTASSIUM CHLORIDE CRYS ER 20 MEQ PO TBCR
40.0000 meq | EXTENDED_RELEASE_TABLET | Freq: Once | ORAL | Status: AC
  Administered 2020-01-26: 40 meq via ORAL
  Filled 2020-01-26: qty 2

## 2020-01-26 MED ORDER — KETOROLAC TROMETHAMINE 30 MG/ML IJ SOLN
30.0000 mg | Freq: Four times a day (QID) | INTRAMUSCULAR | Status: AC
  Administered 2020-01-26 (×2): 30 mg via INTRAVENOUS
  Filled 2020-01-26 (×2): qty 1

## 2020-01-26 MED ORDER — GUAIFENESIN ER 600 MG PO TB12
600.0000 mg | ORAL_TABLET | Freq: Two times a day (BID) | ORAL | Status: DC
  Administered 2020-01-26 – 2020-02-01 (×13): 600 mg via ORAL
  Filled 2020-01-26 (×14): qty 1

## 2020-01-26 NOTE — Plan of Care (Signed)

## 2020-01-26 NOTE — Progress Notes (Addendum)
[address]       [place] [address]             [phone]       2 Days Post-Op Procedure(s) (LRB): VIDEO BRONCHOSCOPY (N/A) XI ROBOTIC ASSISTED THORASCOPY-LUNG WEDGE RESECTION AND RIGHT UPPER  LOBE LOBECTOMY (Right) LYMPH NODE DISSECTION (N/A) Intercostal Nerve Block (Right)  Subjective: Patient with productive cough (light blood tinged). He is passing flatus but no bowel movement yet.  Objective: Vital signs in last 24 hours: Temp:  [98.2 F (36.8 C)-99.6 F (37.6 C)] 98.2 F (36.8 C) (02/05 0302) Pulse Rate:  [66-86] 71 (02/05 0302) Cardiac Rhythm: Normal sinus rhythm;Heart block (02/05 0428) Resp:  [17-29] 20 (02/05 0428) BP: (97-150)/(46-70) 124/63 (02/05 0302) SpO2:  [90 %-100 %] 97 % (02/05 0428) Arterial Line BP: (128-141)/(53-57) 128/53 (02/04 0800)      Intake/Output from previous day: 02/04 0701 - 02/05 0700 In: 724.3 [P.O.:120; I.V.:502.4; IV Piggyback:101.9] Out: 2040 [Urine:1600; Chest Tube:440]   Physical Exam:  Cardiovascular: RRR Pulmonary: Clear to auscultation on left and diminished right apex Abdomen: Soft, non tender, bowel sounds present. Extremities: No lower extremity edema. Wounds: Clean and dry.  No erythema or signs of infection. Chest Tube: to water seal, small air leak with cough  Lab Results: CBC: Recent Labs    01/25/20 0205 01/25/20 0316 01/25/20 0407 01/26/20 0432  WBC 9.7  --   --  8.0  HGB 11.0*   < > 9.5* 10.1*  HCT 34.1*   < > 28.0* 32.4*  PLT 189  --   --  162   < > = values in this interval not displayed.   BMET:  Recent Labs    01/25/20 0205 01/25/20 0316 01/25/20 0407 01/26/20 0432  NA 138   < > 138 141  K 4.2   < > 3.6 3.6  CL 104  --   --  105  CO2 25  --   --  28  GLUCOSE 151*  --   --  122*  BUN 18  --   --  15  CREATININE 1.04  --   --  0.87  CALCIUM 8.1*  --   --  8.3*   < > = values in this interval not displayed.    PT/INR: No results for input(s): LABPROT, INR in  the last 72 hours. ABG:  INR: Will add last result for INR, ABG once components are confirmed Will add last 4 CBG results once components are confirmed  Assessment/Plan: 1. CV-SR, first degree heart block with HR in the 70-80's. On Coreg 12.5 mg daily, HCTZ 12.5 mg daily. 2. Pulmonary- On 2 liters of oxygen via [place]. Will wean to room air over next few days. Chest tube with 440 cc of output since surgery. Chest tube is to water seal and there is a small air leak with cough. CXR this am shows subcutaneous emphysema right later chest wall and decreased space on the right but space is still large.  Chest tube to be placed to 10 cm of suction, per Dr. Servando Snare. Mucinex for cough. Encourage incentive spirometer and flutter valve. Await final pathology for staging. 3. Mildly elevate AST at 63 but ALT, alk phos and tbili "WNL" 6. Anemia-H and H this am decreased to 10.1 and 32.4 7. Supplement potassium 8. Regarding his upper and lower extremity twitching, he could be seen by neurology as an outpatient. Unsure of etiology of erythema and tenderness of  back left side of his neck (in the shape of a partial J). Will continue to monitor. 9. Will give a few doses of Toradol to help with pain ans remove PCA (as is not using)   Donielle M ZimmermanPA-C 01/26/2020,6:59 AM [phone]  DG CHEST PORT 1 VIEW  Result Date: 01/26/2020 CLINICAL DATA:  Pneumothorax and chest tube. EXAM: PORTABLE CHEST 1 VIEW COMPARISON:  01/25/2020 FINDINGS: Right chest tube is in stable position with the tip near the apex of the right chest. The right pneumothorax has decreased in size. There continues to be a 25% pneumothorax. Persistent densities in the right lung compatible with atelectasis. Overall, low lung volumes. Linear densities in the left lower lung are suggestive for atelectasis or scarring. Heart and mediastinum are within normal limits and stable. Right jugular central line at the superior cavoatrial junction. Again noted  is subcutaneous gas in the right chest. IMPRESSION: Right pneumothorax has decreased in size. Residual 25% pneumothorax. Stable position of the right chest tube. Electronically Signed   By: Markus Daft M.D.   On: 01/26/2020 08:39   I have independently reviewed the above radiology studies  and reviewed the findings with the patient.  Ambulating , intermittent air leak, ptx decreased, place on low suction Path still pending  Follow up chest xray in am  I have seen and examined Orrin Brigham and agree with the above assessment  and plan.  Grace Isaac MD Beeper [phone] Office [phone] 01/26/2020 1:04 PM

## 2020-01-26 NOTE — Progress Notes (Signed)
Wasted 62mL [place] PCA in [employer] with Enid Derry, Therapist, sports.

## 2020-01-27 ENCOUNTER — Inpatient Hospital Stay (HOSPITAL_COMMUNITY): Payer: Medicare Other

## 2020-01-27 LAB — CBC
HCT: 32.5 % — ABNORMAL LOW (ref 39.0–52.0)
Hemoglobin: 10.2 g/dL — ABNORMAL LOW (ref 13.0–17.0)
MCH: 27 pg (ref 26.0–34.0)
MCHC: 31.4 g/dL (ref 30.0–36.0)
MCV: 86 fL (ref 80.0–100.0)
Platelets: 174 10*3/uL (ref 150–400)
RBC: 3.78 MIL/uL — ABNORMAL LOW (ref 4.22–5.81)
RDW: 14.8 % (ref 11.5–15.5)
WBC: 8.8 10*3/uL (ref 4.0–10.5)
nRBC: 0 % (ref 0.0–0.2)

## 2020-01-27 LAB — BASIC METABOLIC PANEL
Anion gap: 7 (ref 5–15)
BUN: 24 mg/dL — ABNORMAL HIGH (ref 8–23)
CO2: 28 mmol/L (ref 22–32)
Calcium: 8.2 mg/dL — ABNORMAL LOW (ref 8.9–10.3)
Chloride: 104 mmol/L (ref 98–111)
Creatinine, Ser: 0.86 mg/dL (ref 0.61–1.24)
GFR calc Af Amer: >60 mL/min (ref >60)
GFR calc non Af Amer: >60 mL/min (ref >60)
Glucose, Bld: 114 mg/dL — ABNORMAL HIGH (ref 70–99)
Potassium: 3.7 mmol/L (ref 3.5–5.1)
Sodium: 139 mmol/L (ref 135–145)

## 2020-01-27 MED ORDER — LEVALBUTEROL HCL 0.63 MG/3ML IN NEBU
0.6300 mg | INHALATION_SOLUTION | Freq: Two times a day (BID) | RESPIRATORY_TRACT | Status: DC
  Administered 2020-01-27 (×2): 0.63 mg via RESPIRATORY_TRACT
  Filled 2020-01-27 (×2): qty 3

## 2020-01-27 NOTE — Plan of Care (Signed)

## 2020-01-27 NOTE — Progress Notes (Signed)
SATURATION QUALIFICATIONS: (This note is used to comply with regulatory documentation for home oxygen)  Patient Saturations on Room Air at Rest = 97%  Patient Saturations on Room Air while Ambulating = 82%  Patient Saturations on 3 Liters of oxygen while Ambulating = 90%  Please briefly explain why patient needs home oxygen: desats/mild SOB with activity

## 2020-01-27 NOTE — Progress Notes (Signed)
3 Days Post-Op Procedure(s) (LRB): VIDEO BRONCHOSCOPY (N/A) XI ROBOTIC ASSISTED THORASCOPY-LUNG WEDGE RESECTION AND RIGHT UPPER  LOBE LOBECTOMY (Right) LYMPH NODE DISSECTION (N/A) Intercostal Nerve Block (Right) Subjective: Sitting up in bed. Describes feeling "uncomfortable" in his chest but having less pain.   Objective: Vital signs in last 24 hours: Temp:  [97.9 F (36.6 C)-98.9 F (37.2 C)] 97.9 F (36.6 C) (02/06 0729) Pulse Rate:  [66-80] 80 (02/06 0729) Cardiac Rhythm: Normal sinus rhythm;Heart block (02/06 0701) Resp:  [16-27] 22 (02/06 0729) BP: (115-142)/(64-83) 115/65 (02/06 0729) SpO2:  [90 %-100 %] 94 % (02/06 0729)     Intake/Output from previous day: 02/05 0701 - 02/06 0700 In: 718.3 [P.O.:480; I.V.:238.3] Out: 1290 [Urine:850; Chest Tube:440] Intake/Output this shift: Total I/O In: 240 [P.O.:240] Out: -   General appearance: alert, cooperative and mild distress Neurologic: intact and no 'twitching" in extremities observed this morning.  Heart: SR with 1st degree HB.  Lungs: Breath sounds are clear.  CT has an active air leak (3-4+ with cough). Pleural drainage 493ml past 24 hours. CT on -10cmH2O suction. CXR this AM shows similar right apical space, right lateral subQ air about the same.  Wound: Incisions are well approximated and dry.  Lab Results: Recent Labs    01/26/20 0432 01/27/20 0453  WBC 8.0 8.8  HGB 10.1* 10.2*  HCT 32.4* 32.5*  PLT 162 174   BMET:  Recent Labs    01/26/20 0432 01/27/20 0453  NA 141 139  K 3.6 3.7  CL 105 104  CO2 28 28  GLUCOSE 122* 114*  BUN 15 24*  CREATININE 0.87 0.86  CALCIUM 8.3* 8.2*    PT/INR: No results for input(s): LABPROT, INR in the last 72 hours. ABG    Component Value Date/Time   PHART 7.439 01/25/2020 0407   HCO3 24.0 01/25/2020 0407   TCO2 25 01/25/2020 0407   ACIDBASEDEF 1.0 01/24/2020 1943   O2SAT 95.0 01/25/2020 0407   CBG (last 3)  No results for input(s): GLUCAP in the last 72  hours.  Assessment/Plan: S/P Procedure(s) (LRB): VIDEO BRONCHOSCOPY (N/A) XI ROBOTIC ASSISTED THORASCOPY-LUNG WEDGE RESECTION AND RIGHT UPPER  LOBE LOBECTOMY (Right) LYMPH NODE DISSECTION (N/A) Intercostal Nerve Block (Right)  -POD-3 robotic assisted right upper lobectomy. Persistent air leak. No significant change in the apical air space with addition of suction yesterday. Will return to water seal today. CXR in AM. Continue working on pulmonary hygiene and O2 wean.   -Mild expected acute blood loss anemia- Stable, monitor.   -HTN- Control acceptable on Coreg and HCTZ  -HLD-Lipitor and Zetia resumed  -GERD-PPI ordered  -DVT PPX- Mobilize, continue enoxaparin     LOS: 3 days    Antony Odea, PA-C [phone] 01/27/2020

## 2020-01-28 ENCOUNTER — Inpatient Hospital Stay (HOSPITAL_COMMUNITY): Payer: Medicare Other

## 2020-01-28 MED ORDER — LEVALBUTEROL HCL 0.63 MG/3ML IN NEBU: 0.6300 mg | INHALATION_SOLUTION | Freq: Four times a day (QID) | RESPIRATORY_TRACT | Status: DC | PRN

## 2020-01-28 NOTE — Progress Notes (Signed)
Pt ambulated ~823ft X3 in the hall without a walker. Tolerated well.

## 2020-01-28 NOTE — Progress Notes (Signed)
4 Days Post-Op Procedure(s) (LRB): VIDEO BRONCHOSCOPY (N/A) XI ROBOTIC ASSISTED THORASCOPY-LUNG WEDGE RESECTION AND RIGHT UPPER  LOBE LOBECTOMY (Right) LYMPH NODE DISSECTION (N/A) Intercostal Nerve Block (Right) Subjective: Up in the bedside chair. He has no new problems or concerns.   Objective: Vital signs in last 24 hours: Temp:  [97.9 F (36.6 C)-98.4 F (36.9 C)] 98.2 F (36.8 C) (02/07 0930) Pulse Rate:  [60-86] 86 (02/07 0937) Cardiac Rhythm: Sinus bradycardia (02/07 0700) Resp:  [16-29] 29 (02/07 0930) BP: (95-148)/(64-82) 129/66 (02/07 0937) SpO2:  [94 %-100 %] 98 % (02/07 0930)     Intake/Output from previous day: 02/06 0701 - 02/07 0700 In: 380 [P.O.:360; I.V.:20] Out: 1935 [Urine:1675; Chest Tube:260] Intake/Output this shift: Total I/O In: -  Out: 100 [Chest Tube:100]   Physical Exam: General appearance: alert, cooperative and mild distress Neurologic: intact but has 'twitching" in extremities again today.   Heart: SR with 1st degree HB.  Lungs: Breath sounds are clear.  CT air leak has slowed to 1+ today. Pleural drainage alos decreased to 236ml past 24 hours. CT water seal past 24 hours.  CXR this AM shows a slightly larger right apical space, right lateral subQ air is unchanged.  Wound: Incisions are well approximated and dry.  Lab Results: Recent Labs    01/26/20 0432 01/27/20 0453  WBC 8.0 8.8  HGB 10.1* 10.2*  HCT 32.4* 32.5*  PLT 162 174   BMET:  Recent Labs    01/26/20 0432 01/27/20 0453  NA 141 139  K 3.6 3.7  CL 105 104  CO2 28 28  GLUCOSE 122* 114*  BUN 15 24*  CREATININE 0.87 0.86  CALCIUM 8.3* 8.2*    PT/INR: No results for input(s): LABPROT, INR in the last 72 hours. ABG    Component Value Date/Time   PHART 7.439 01/25/2020 0407   HCO3 24.0 01/25/2020 0407   TCO2 25 01/25/2020 0407   ACIDBASEDEF 1.0 01/24/2020 1943   O2SAT 95.0 01/25/2020 0407   CBG (last 3)  No results for input(s): GLUCAP in the last 72  hours.  Assessment/Plan: S/P Procedure(s) (LRB): VIDEO BRONCHOSCOPY (N/A) XI ROBOTIC ASSISTED THORASCOPY-LUNG WEDGE RESECTION AND RIGHT UPPER  LOBE LOBECTOMY (Right) LYMPH NODE DISSECTION (N/A) Intercostal Nerve Block (Right)   -POD-4 robotic assisted right upper lobectomy. The air leak has slowed. Larger apical air space after being on water seal.  I was notified of the "larger" pneumothorax while I the OR this morning so I asked to have the pleural tube placed back to suction (-10cm H2O).  After reviewing the CXR, I suspect the 25-30% apical space will likely be the accepted end point for the right lung expansion.  Will repeat the CXR to check if suction made any significant improvement and if not, will return the tube to water seal.   -Mild expected acute blood loss anemia- Stable, monitor. Lab in am.  -HTN- Control acceptable on Coreg and HCTZ  -HLD-Lipitor and Zetia resumed  -GERD-PPI ordered  -DVT PPX- Mobilize, continue enoxaparin   LOS: 4 days    Antony Odea, PA-C [phone] 01/28/2020

## 2020-01-28 NOTE — Progress Notes (Signed)
Notified PA of pt morning chest xray result: increase 30% apical pneumothorax. Per verbal order, placed pt back on -10 suction. Changed pt Pleur-evac sahara.  Pt denied any pain/sob.

## 2020-01-29 ENCOUNTER — Inpatient Hospital Stay (HOSPITAL_COMMUNITY): Payer: Medicare Other

## 2020-01-29 LAB — CBC
HCT: 30.8 % — ABNORMAL LOW (ref 39.0–52.0)
Hemoglobin: 9.7 g/dL — ABNORMAL LOW (ref 13.0–17.0)
MCH: 26.7 pg (ref 26.0–34.0)
MCHC: 31.5 g/dL (ref 30.0–36.0)
MCV: 84.8 fL (ref 80.0–100.0)
Platelets: 193 10*3/uL (ref 150–400)
RBC: 3.63 MIL/uL — ABNORMAL LOW (ref 4.22–5.81)
RDW: 14.6 % (ref 11.5–15.5)
WBC: 6.6 10*3/uL (ref 4.0–10.5)
nRBC: 0 % (ref 0.0–0.2)

## 2020-01-29 LAB — BASIC METABOLIC PANEL
Anion gap: 10 (ref 5–15)
BUN: 17 mg/dL (ref 8–23)
CO2: 26 mmol/L (ref 22–32)
Calcium: 8.5 mg/dL — ABNORMAL LOW (ref 8.9–10.3)
Chloride: 104 mmol/L (ref 98–111)
Creatinine, Ser: 0.68 mg/dL (ref 0.61–1.24)
GFR calc Af Amer: >60 mL/min (ref >60)
GFR calc non Af Amer: >60 mL/min (ref >60)
Glucose, Bld: 114 mg/dL — ABNORMAL HIGH (ref 70–99)
Potassium: 3.5 mmol/L (ref 3.5–5.1)
Sodium: 140 mmol/L (ref 135–145)

## 2020-01-29 LAB — SURGICAL PATHOLOGY

## 2020-01-29 MED ORDER — POTASSIUM CHLORIDE CRYS ER 20 MEQ PO TBCR
30.0000 meq | EXTENDED_RELEASE_TABLET | Freq: Two times a day (BID) | ORAL | Status: AC
  Administered 2020-01-29 (×2): 30 meq via ORAL
  Filled 2020-01-29 (×2): qty 1

## 2020-01-29 NOTE — Progress Notes (Signed)
Physical Therapy Treatment Patient Details Name: Daniel Colon MRN: [id number] DOB: 1946-12-17 Today's Date: 01/29/2020    History of Present Illness 74 y.o. male with PMH significant for CAD, Barrett's esophagus, cataracts, GERD, HTN, MI. Pt with RUL long lesion of increasing size, now s/p Thoracoscopy with lung wedge resection, RUL lobectomy, and lymph node dissection.    PT Comments    Patient continues to make good progress with mobility. Pt tolerated gait and stair training well. SpO2 desat to 86% on RA while mobilizing and >90% at rest. Current plan remains appropriate.    Follow Up Recommendations  No PT follow up     Equipment Recommendations  None recommended by PT    Recommendations for Other Services       Precautions / Restrictions Precautions Precaution Comments: watch SpO2 Restrictions Weight Bearing Restrictions: No    Mobility  Bed Mobility Overal bed mobility: (pt received and left sitting up in recliner)                Transfers Overall transfer level: Independent                  Ambulation/Gait Ambulation/Gait assistance: Independent   Assistive device: None Gait Pattern/deviations: Step-through pattern Gait velocity: functional   General Gait Details: steady gait; SOB but able to converse while ambulating; SpO2 desat 86% on RA with mobility   Stairs Stairs: Yes Stairs assistance: Modified independent (Device/Increase time) Stair Management: One rail Left;Forwards;Alternating pattern Number of Stairs: 10     Wheelchair Mobility    Modified Rankin (Stroke Patients Only)       Balance Overall balance assessment: Independent                                          Cognition Arousal/Alertness: Awake/alert Behavior During Therapy: WFL for tasks assessed/performed Overall Cognitive Status: Within Functional Limits for tasks assessed                                        Exercises       General Comments        Pertinent Vitals/Pain Pain Assessment: Faces Faces Pain Scale: Hurts a little bit Pain Location: chest tube site Pain Descriptors / Indicators: Discomfort Pain Intervention(s): Monitored during session    Home Living                      Prior Function            PT Goals (current goals can now be found in the care plan section) Progress towards PT goals: Progressing toward goals    Frequency    Min 2X/week      PT Plan Current plan remains appropriate    Co-evaluation              AM-PAC PT "6 Clicks" Mobility   Outcome Measure  Help needed turning from your back to your side while in a flat bed without using bedrails?: None Help needed moving from lying on your back to sitting on the side of a flat bed without using bedrails?: None Help needed moving to and from a bed to a chair (including a wheelchair)?: None Help needed standing up from a chair using your arms (e.g., wheelchair or bedside chair)?: None  Help needed to walk in hospital room?: None Help needed climbing 3-5 steps with a railing? : None 6 Click Score: 24    End of Session   Activity Tolerance: Patient tolerated treatment well Patient left: in chair;with call bell/phone within reach Nurse Communication: Mobility status PT Visit Diagnosis: Other (comment)(cardiopulmonary function)     Time: 7215-8727 PT Time Calculation (min) (ACUTE ONLY): 20 min  Charges:  $Gait Training: 8-22 mins                     Earney Navy, PTA Acute Rehabilitation Services Pager: [phone] Office: [phone]     Darliss Cheney 01/29/2020, 10:54 AM

## 2020-01-29 NOTE — Progress Notes (Signed)
      [address]       [place] [address]             [phone]    Reviewed path with patient   Single node in lobe specimen , of 12 positive   Cancer Staging Lung cancer [facility]) Staging form: Lung, AJCC 8th Edition - Pathologic stage from 01/29/2020: Stage IIB (pT1c, pN1, cM0) - Signed by Grace Isaac, MD on 01/29/2020  Will review at [facility] conference.

## 2020-01-29 NOTE — Plan of Care (Signed)

## 2020-01-29 NOTE — Plan of Care (Signed)
  Problem: Clinical Measurements: Goal: Respiratory complications will improve Outcome: Progressing Goal: Cardiovascular complication will be avoided Outcome: Progressing   Problem: Pain Managment: Goal: General experience of comfort will improve Outcome: Progressing   Problem: Education: Goal: Knowledge of disease or condition will improve Outcome: Progressing   Problem: Activity: Goal: Risk for activity intolerance will decrease Outcome: Progressing

## 2020-01-29 NOTE — Progress Notes (Addendum)
      [address]       [place] [address]             [phone]       5 Days Post-Op Procedure(s) (LRB): VIDEO BRONCHOSCOPY (N/A) XI ROBOTIC ASSISTED THORASCOPY-LUNG WEDGE RESECTION AND RIGHT UPPER  LOBE LOBECTOMY (Right) LYMPH NODE DISSECTION (N/A) Intercostal Nerve Block (Right)  Subjective: Patient sitting in chair about to eat breakfast. He has some shortness of breath with ambulation.  Objective: Vital signs in last 24 hours: Temp:  [97.7 F (36.5 C)-98.8 F (37.1 C)] 97.9 F (36.6 C) (02/08 0336) Pulse Rate:  [58-86] 58 (02/08 0336) Cardiac Rhythm: Normal sinus rhythm;Bundle branch block (02/08 0408) Resp:  [17-29] 20 (02/08 0336) BP: (129-147)/(61-81) 138/65 (02/08 0336) SpO2:  [95 %-100 %] 99 % (02/08 0336)      Intake/Output from previous day: 02/07 0701 - 02/08 0700 In: 710 [P.O.:710] Out: 2191 [Urine:1800; Stool:1; Chest Tube:390]   Physical Exam:  Cardiovascular: RRR Pulmonary: Clear to auscultation on left and diminished right apex Abdomen: Soft, non tender, bowel sounds present. Extremities: No lower extremity edema. Wounds: Clean and dry.  No erythema or signs of infection. Chest Tube: to suction, no air leak   Lab Results: CBC: Recent Labs    01/27/20 0453 01/29/20 0428  WBC 8.8 6.6  HGB 10.2* 9.7*  HCT 32.5* 30.8*  PLT 174 193   BMET:  Recent Labs    01/27/20 0453 01/29/20 0428  NA 139 140  K 3.7 3.5  CL 104 104  CO2 28 26  GLUCOSE 114* 114*  BUN 24* 17  CREATININE 0.86 0.68  CALCIUM 8.2* 8.5*    PT/INR: No results for input(s): LABPROT, INR in the last 72 hours. ABG:  INR: Will add last result for INR, ABG once components are confirmed Will add last 4 CBG results once components are confirmed  Assessment/Plan: 1. CV-SR, first degree heart block with HR in the 60's this am. On Coreg 12.5 mg daily, HCTZ 12.5 mg daily. 2. Pulmonary- On 1 liter of oxygen via . Will wean to room air over next few  days. Chest tube with 390 cc of output since surgery. Chest tube is to water seal and there is no air leak this am. CXR this am shows subcutaneous emphysema right later chest wall and right pneumothorax (? Slight increase). Chest tube on 15 cm of suction this am and I increased it to 20 cm. Mucinex for cough. Encourage incentive spirometer and flutter valve. Await final pathology for staging. 3. Anemia-H and H this am decreased to 9.7 and 30.8 4. Supplement potassium 5. Regarding his upper and lower extremity twitching, he could be seen by neurology as an outpatient.    Donielle M ZimmermanPA-C 01/29/2020,6:59 AM [phone]  Apical space present, +1 air leak with cough Chest tube to -20 cm, follow up chest xray in am, chest tube to stay today  I have seen and examined Daniel Colon and agree with the above assessment  and plan.  Grace Isaac MD Beeper [phone] Office [phone] 01/29/2020 8:33 AM

## 2020-01-29 NOTE — Care Management Important Message (Signed)
Important Message  Patient Details  Name: Daniel Colon MRN: [id number] Date of Birth: 1946/04/05   Medicare Important Message Given:  Yes     Memory Argue 01/29/2020, 12:48 PM

## 2020-01-30 ENCOUNTER — Inpatient Hospital Stay (HOSPITAL_COMMUNITY): Payer: Medicare Other

## 2020-01-30 NOTE — Plan of Care (Signed)
  Problem: Education: Goal: Knowledge of General Education information will improve Description: Including pain rating scale, medication(s)/side effects and non-pharmacologic comfort measures Outcome: Progressing   Problem: Education: Goal: Knowledge of General Education information will improve Description: Including pain rating scale, medication(s)/side effects and non-pharmacologic comfort measures Outcome: Progressing   Problem: Health Behavior/Discharge Planning: Goal: Ability to manage health-related needs will improve Outcome: Progressing   Problem: Clinical Measurements: Goal: Ability to maintain clinical measurements within normal limits will improve Outcome: Progressing   Problem: Activity: Goal: Risk for activity intolerance will decrease Outcome: Progressing   Problem: Nutrition: Goal: Adequate nutrition will be maintained Outcome: Progressing   Problem: Coping: Goal: Level of anxiety will decrease Outcome: Progressing   Problem: Pain Managment: Goal: General experience of comfort will improve Outcome: Progressing   Problem: Activity: Goal: Risk for activity intolerance will decrease Outcome: Progressing   Problem: Pain Management: Goal: Pain level will decrease Outcome: Progressing

## 2020-01-30 NOTE — Plan of Care (Signed)

## 2020-01-30 NOTE — Progress Notes (Addendum)
      [address]       [place] [address]             [phone]       6 Days Post-Op Procedure(s) (LRB): VIDEO BRONCHOSCOPY (N/A) XI ROBOTIC ASSISTED THORASCOPY-LUNG WEDGE RESECTION AND RIGHT UPPER  LOBE LOBECTOMY (Right) LYMPH NODE DISSECTION (N/A) Intercostal Nerve Block (Right)  Subjective: Patient lying in chair without specific complaints this am  Objective: Vital signs in last 24 hours: Temp:  [97.8 F (36.6 C)-98.8 F (37.1 C)] 97.8 F (36.6 C) (02/09 0303) Pulse Rate:  [60-77] 62 (02/09 0303) Cardiac Rhythm: Normal sinus rhythm (02/09 0426) Resp:  [18-22] 20 (02/09 0303) BP: (103-133)/(69-93) 133/69 (02/09 0303) SpO2:  [91 %-100 %] 95 % (02/09 0303)      Intake/Output from previous day: 02/08 0701 - 02/09 0700 In: -  Out: 2245 [Urine:1975; Chest Tube:270]   Physical Exam:  Cardiovascular: RRR Pulmonary: Clear to auscultation on left and diminished right apex Abdomen: Soft, non tender, bowel sounds present. Extremities: No lower extremity edema. Wounds: Clean and dry.  No erythema or signs of infection. Chest Tube: to suction, very intermittent +1 air leak with cough  Lab Results: CBC: Recent Labs    01/29/20 0428  WBC 6.6  HGB 9.7*  HCT 30.8*  PLT 193   BMET:  Recent Labs    01/29/20 0428  NA 140  K 3.5  CL 104  CO2 26  GLUCOSE 114*  BUN 17  CREATININE 0.68  CALCIUM 8.5*    PT/INR: No results for input(s): LABPROT, INR in the last 72 hours. ABG:  INR: Will add last result for INR, ABG once components are confirmed Will add last 4 CBG results once components are confirmed  Assessment/Plan: 1. CV-SR, first degree heart block with HR in the 60's this am. On Coreg 12.5 mg daily, HCTZ 12.5 mg daily. 2. Pulmonary-  Chest tube with 270 cc last 24 hours. Chest tube is to suction and there is a very intermittent +1 air leak with cough. CXR this am shows stable subcutaneous emphysema right later chest wall and right  pneumothorax . Chest tube on 20 cm of suction. As discussed with Dr. Servando Snare, will try water seal. Mucinex for cough. Encourage incentive spirometer and flutter valve.Check CXR in am 3. Anemia-Last H and H  decreased to 9.7 and 30.8 4. Regarding his upper and lower extremity twitching, he could be seen by neurology as an outpatient.    Donielle M ZimmermanPA-C 01/30/2020,6:58 AM [phone] Continues gaining strength and ambulating Stable size of right upper lobe space, very small air leak with multiple coughs Chest tube to water seal , hope can get out soon if no air leak I have seen and examined Daniel Colon and agree with the above assessment  and plan.  Grace Isaac MD Beeper [phone] Office [phone] 01/30/2020 1:47 PM

## 2020-01-31 ENCOUNTER — Inpatient Hospital Stay (HOSPITAL_COMMUNITY): Payer: Medicare Other

## 2020-01-31 NOTE — Progress Notes (Signed)
Patient showed a MEWS score of yellow with respirations greater than 22. Patient was walking around in his room and stated he did not feel short of breath. Patient is on room air and O2 sats were 98%.  No reason to notify MD or rapid response at this time. Will continue to monitor patient closely though out the night.

## 2020-01-31 NOTE — Plan of Care (Signed)

## 2020-01-31 NOTE — Progress Notes (Signed)
Right chest tube removed per MD order without difficulty.  Sutures tied and small gauze and occlusive dressing applied.  Patient tolerated well.  Instructed to call staff if SOB or any changes in breathing

## 2020-01-31 NOTE — Progress Notes (Addendum)
      [address]       [place] [address]             [phone]       7 Days Post-Op Procedure(s) (LRB): VIDEO BRONCHOSCOPY (N/A) XI ROBOTIC ASSISTED THORASCOPY-LUNG WEDGE RESECTION AND RIGHT UPPER  LOBE LOBECTOMY (Right) LYMPH NODE DISSECTION (N/A) Intercostal Nerve Block (Right)  Subjective: Patient lying in chair without specific complaints this am  Objective: Vital signs in last 24 hours: Temp:  [98.1 F (36.7 C)-99 F (37.2 C)] 98.2 F (36.8 C) (02/10 0336) Pulse Rate:  [60-89] 68 (02/10 0013) Cardiac Rhythm: Normal sinus rhythm (02/10 0400) Resp:  [15-25] 18 (02/10 0336) BP: (129-157)/(63-74) 133/74 (02/10 0336) SpO2:  [90 %-97 %] 94 % (02/10 0336)      Intake/Output from previous day: 02/09 0701 - 02/10 0700 In: 360 [P.O.:360] Out: 370 [Urine:300; Chest Tube:70]   Physical Exam:  Cardiovascular: RRR Pulmonary: Clear to auscultation on left and diminished right apex Abdomen: Soft, non tender, bowel sounds present. Extremities: No lower extremity edema. Wounds: Clean and dry.  No erythema or signs of infection. Chest Tube: to water seal, very intermittent +1 air leak with cough over last several days;however, unable to see air leak this am  Lab Results: CBC: Recent Labs    01/29/20 0428  WBC 6.6  HGB 9.7*  HCT 30.8*  PLT 193   BMET:  Recent Labs    01/29/20 0428  NA 140  K 3.5  CL 104  CO2 26  GLUCOSE 114*  BUN 17  CREATININE 0.68  CALCIUM 8.5*    PT/INR: No results for input(s): LABPROT, INR in the last 72 hours. ABG:  INR: Will add last result for INR, ABG once components are confirmed Will add last 4 CBG results once components are confirmed  Assessment/Plan: 1. CV-SR, first degree heart block with HR in the 60's this am. On Coreg 12.5 mg daily, HCTZ 12.5 mg daily. 2. Pulmonary-  Chest tube with 120 cc last 24 hours. Chest tube is to water seal and very intermittent +1 air leak with cough over last several  days;however, unable to see air leak this am. CXR this am shows stable subcutaneous emphysema right later chest wall and right pneumothorax . Hope to remove chest tube soon. Mucinex for cough. Encourage incentive spirometer and flutter valve.Check CXR in am 3. Anemia-Last H and H  decreased to 9.7 and 30.8 4. Regarding his upper and lower extremity twitching, he could be seen by neurology as an outpatient.    Donielle M ZimmermanPA-C 01/31/2020,7:00 AM [phone]  Feels well today, ambulating well Apical space stable, no air leak with cough today  Plan d/c chest tube and poss home tomorrow  I have seen and examined Daniel Colon and agree with the above assessment  and plan.  Grace Isaac MD Beeper [phone] Office [phone] 01/31/2020 9:28 AM

## 2020-02-01 ENCOUNTER — Inpatient Hospital Stay (HOSPITAL_COMMUNITY): Payer: Medicare Other

## 2020-02-01 ENCOUNTER — Other Ambulatory Visit: Payer: Self-pay | Admitting: *Deleted

## 2020-02-01 MED ORDER — GUAIFENESIN ER 600 MG PO TB12: 600.0000 mg | ORAL_TABLET | Freq: Two times a day (BID) | ORAL | Status: DC | PRN

## 2020-02-01 MED ORDER — TRAMADOL HCL 50 MG PO TABS: 50.0000 mg | ORAL_TABLET | Freq: Four times a day (QID) | ORAL | 0 refills | Status: DC | PRN

## 2020-02-01 NOTE — Progress Notes (Addendum)
      [address]       [place] [address]             [phone]       8 Days Post-Op Procedure(s) (LRB): VIDEO BRONCHOSCOPY (N/A) XI ROBOTIC ASSISTED THORASCOPY-LUNG WEDGE RESECTION AND RIGHT UPPER  LOBE LOBECTOMY (Right) LYMPH NODE DISSECTION (N/A) Intercostal Nerve Block (Right)  Subjective: Patient sitting in chair without specific complaints this am. He is eating breakfast. He states his breathing seems to get better daily  Objective: Vital signs in last 24 hours: Temp:  [97 F (36.1 C)-99.2 F (37.3 C)] 98.1 F (36.7 C) (02/11 0446) Pulse Rate:  [59-64] 60 (02/11 0400) Cardiac Rhythm: Normal sinus rhythm (02/11 0400) Resp:  [14-27] 21 (02/11 0400) BP: (101-173)/(58-87) 101/58 (02/11 0446) SpO2:  [94 %-98 %] 96 % (02/11 0400)      Intake/Output from previous day: 02/10 0701 - 02/11 0700 In: 850 [P.O.:850] Out: -    Physical Exam:  Cardiovascular: RRR Pulmonary: Clear to auscultation on left and diminished right apex Abdomen: Soft, non tender, bowel sounds present. Extremities: No lower extremity edema. Wounds: Clean and dry.  No erythema or signs of infection.   Lab Results: CBC: No results for input(s): WBC, HGB, HCT, PLT in the last 72 hours. BMET:  No results for input(s): NA, K, CL, CO2, GLUCOSE, BUN, CREATININE, CALCIUM in the last 72 hours.  PT/INR: No results for input(s): LABPROT, INR in the last 72 hours. ABG:  INR: Will add last result for INR, ABG once components are confirmed Will add last 4 CBG results once components are confirmed  Assessment/Plan: 1. CV-SR, first degree heart block with HR in the 60's this am. On Coreg 12.5 mg daily, HCTZ 12.5 mg daily. Will stop HCTZ and restart Micardis at discharge 2. Pulmonary-  Chest tube removed yesterday. He does NOT desat on room air or with ambulation. CXR this am appears to show stable, subcutaneous emphysema right later chest wall and right apical space .  Mucinex for  cough. Encourage incentive spirometer and flutter valve.  3. Anemia-Last H and H  decreased to 9.7 and 30.8 4. Regarding his upper and lower extremity twitching, he could be seen by neurology as an outpatient. 5.  The right apical space has remained stable and he has good oxygenation on room air and with ambulation. Doubt need for pigtail;likely discharge home and follow up with PA and CXR in one week.   Donielle M ZimmermanPA-C 02/01/2020,6:59 AM [phone]  Stable apical right space- plan d/c home today Early follow up with chest xray in office  I have seen and examined Daniel Colon and agree with the above assessment  and plan.  Grace Isaac MD Beeper [phone] Office [phone] 02/01/2020 9:04 AM

## 2020-02-01 NOTE — Progress Notes (Signed)
Physical Therapy Treatment Patient Details Name: Daniel Colon MRN: [id number] DOB: 11/22/1946 Today's Date: 02/01/2020    History of Present Illness 74 y.o. male with PMH significant for CAD, Barrett's esophagus, cataracts, GERD, HTN, MI. Pt with RUL long lesion of increasing size, now s/p Thoracoscopy with lung wedge resection, RUL lobectomy, and lymph node dissection.    PT Comments    Patient is making good progress with PT.  From a mobility standpoint anticipate patient will be ready for DC home when medically ready.   Pt does continue to present with SpO2 desat with mobility to 85% and back up to 94% with standing break.     Follow Up Recommendations  No PT follow up     Equipment Recommendations  None recommended by PT    Recommendations for Other Services       Precautions / Restrictions Precautions Precautions: Other (comment) Precaution Comments: watch SpO2 Restrictions Weight Bearing Restrictions: No    Mobility  Bed Mobility Overal bed mobility: Independent                Transfers Overall transfer level: Independent                  Ambulation/Gait Ambulation/Gait assistance: Independent   Assistive device: None Gait Pattern/deviations: WFL(Within Functional Limits)  Gait distance: 1,000 ft   General Gait Details: steday gait; no increased gait deviations noted with challenges    Stairs   Stairs assistance: Modified independent (Device/Increase time)     General stair comments: use of rail   Wheelchair Mobility    Modified Rankin (Stroke Patients Only)       Balance Overall balance assessment: Independent                                          Cognition Arousal/Alertness: Awake/alert Behavior During Therapy: WFL for tasks assessed/performed Overall Cognitive Status: Within Functional Limits for tasks assessed                                        Exercises      General  Comments General comments (skin integrity, edema, etc.): SpO2 desat to 85% on RA with mobility and up to 94% with standing rest break; educated on PLB technique      Pertinent Vitals/Pain Pain Assessment: Faces Faces Pain Scale: Hurts a little bit Pain Location: chest tube site Pain Descriptors / Indicators: Discomfort Pain Intervention(s): Monitored during session    Home Living                      Prior Function            PT Goals (current goals can now be found in the care plan section) Progress towards PT goals: Progressing toward goals    Frequency    Min 2X/week      PT Plan Current plan remains appropriate    Co-evaluation              AM-PAC PT "6 Clicks" Mobility   Outcome Measure  Help needed turning from your back to your side while in a flat bed without using bedrails?: None Help needed moving from lying on your back to sitting on the side of a flat bed without using bedrails?: None Help  needed moving to and from a bed to a chair (including a wheelchair)?: None Help needed standing up from a chair using your arms (e.g., wheelchair or bedside chair)?: None Help needed to walk in hospital room?: None Help needed climbing 3-5 steps with a railing? : None 6 Click Score: 24    End of Session   Activity Tolerance: Patient tolerated treatment well Patient left: in chair;with call bell/phone within reach Nurse Communication: Mobility status PT Visit Diagnosis: Other (comment)(cardiopulmonary function)     Time: 1224-8250 PT Time Calculation (min) (ACUTE ONLY): 16 min  Charges:  $Gait Training: 8-22 mins                     Earney Navy, PTA Acute Rehabilitation Services Pager: [phone] Office: [phone]     Darliss Cheney 02/01/2020, 9:17 AM

## 2020-02-01 NOTE — Progress Notes (Signed)
Patient via wheelchair to waiting car in stable condition

## 2020-02-01 NOTE — Progress Notes (Signed)
The proposed treatment discussed in cancer conference 02/01/20 is for discussion purpose only and is not a binding recommendation.  The patient was not physically examined nor present for their treatment options.  Therefore, final treatment plans cannot be decided.  

## 2020-02-01 NOTE — Progress Notes (Signed)
IV discontinued, discharge instructions reviewed, patient report clear understanding of instructions and followup appointments.  Waiting for ride home

## 2020-02-04 LAB — TYPE AND SCREEN
ABO/RH(D): A NEG
Antibody Screen: NEGATIVE
Unit division: 0
Unit division: 0

## 2020-02-04 LAB — BPAM RBC
Blood Product Expiration Date: 202102202359
Blood Product Expiration Date: 202102202359
ISSUE DATE / TIME: 202102030935
ISSUE DATE / TIME: 202102030935
Unit Type and Rh: 600
Unit Type and Rh: 600

## 2020-02-06 ENCOUNTER — Telehealth: Payer: Self-pay | Admitting: *Deleted

## 2020-02-06 DIAGNOSIS — Z902 Acquired absence of lung [part of]: Secondary | ICD-10-CM

## 2020-02-06 NOTE — Telephone Encounter (Signed)
Oncology Nurse Navigator Documentation  Oncology Nurse Navigator Flowsheets 02/06/2020  Navigator Location [facility]  Referral Date to RadOnc/MedOnc 02/02/2020  Navigator Encounter Type Telephone/I received referral from Dr. Servando Snare.  I called patient to schedule.  Patient verbalized understanding of appt time and place.   Telephone Outgoing Call  Barriers/Navigation Needs Coordination of Care;Education  Education Other  Interventions Coordination of Care;Education  Acuity Level 2-Minimal Needs (1-2 Barriers Identified)  Coordination of Care Appts  Education Method Verbal  Time Spent with Patient 15

## 2020-02-11 ENCOUNTER — Other Ambulatory Visit: Payer: Self-pay | Admitting: Cardiothoracic Surgery

## 2020-02-11 DIAGNOSIS — C3490 Malignant neoplasm of unspecified part of unspecified bronchus or lung: Secondary | ICD-10-CM

## 2020-02-12 ENCOUNTER — Ambulatory Visit (INDEPENDENT_AMBULATORY_CARE_PROVIDER_SITE_OTHER): Payer: Self-pay | Admitting: Surgical

## 2020-02-12 ENCOUNTER — Other Ambulatory Visit: Payer: Self-pay

## 2020-02-12 ENCOUNTER — Ambulatory Visit
Admission: RE | Admit: 2020-02-12 | Discharge: 2020-02-12 | Disposition: A | Discharge status: Home / Self Care | Payer: Medicare Other | Source: Ambulatory Visit | Attending: Cardiothoracic Surgery | Admitting: Cardiothoracic Surgery

## 2020-02-12 VITALS — BP 113/59 | HR 60 | Temp 97.7°F | Resp 20 | Ht 69.0 in | Wt 200.0 lb

## 2020-02-12 DIAGNOSIS — R0602 Shortness of breath: Secondary | ICD-10-CM | POA: Diagnosis not present

## 2020-02-12 DIAGNOSIS — C3490 Malignant neoplasm of unspecified part of unspecified bronchus or lung: Secondary | ICD-10-CM

## 2020-02-12 DIAGNOSIS — R911 Solitary pulmonary nodule: Secondary | ICD-10-CM

## 2020-02-12 NOTE — Patient Instructions (Signed)
Activity progression was discussed including driving

## 2020-02-12 NOTE — Progress Notes (Signed)
Pt is s/p right upper lobectomy on 01/24/20. He is a/o x 4 and w/o complaint. Incisions to R upper to lateral back are c/d/I. Two chest tube incision sites to R upper abdomen w/ sutures intact, incisions well approximated. Mild erythema noted, but no swelling or drainage present. Sutures removed per standard protocol w/o difficulty. Instructed pt to keep sites clean (w/ mild soap and water) and dry. To notify office of any s/s of infection or other problems

## 2020-02-12 NOTE — Progress Notes (Signed)
[address]       [place] [address]             [phone]      Daniel Colon [place] Medical Record #[id number] Date of Birth: 16-Dec-1946  Referring: Magdalen Spatz, NP Primary Care: Leanna Battles, MD Primary Cardiologist: No primary care provider on file.   Chief Complaint:   POST OP FOLLOW UP OPERATIVE REPORT  DATE OF PROCEDURE:  01/24/2020  PREOPERATIVE DIAGNOSIS:  A 2 cm right upper lobe lung mass found on surveillance lung cancer screening CT.  POSTOPERATIVE DIAGNOSES:   1.  Malignant neoplasm, right upper lobe, 2.  Sarcomatous changes on frozen section.  PROCEDURES PERFORMED:   1.  Bronchoscopy.   2.  Robotic-assisted wedge resection- right upper lobe with completion right upper lobe lobectomy, lymph node dissection, and intercostal nerve block.  SURGEON:  Lanelle Bal, MD  FIRST ASSISTANT:  Dr. Kipp Brood.    SECOND ASSISTANTS:   Jadene Pierini, PA and [place], [place].   History of Present Illness:    The patient is a 74 year old male seen in the office on today's date and routine postsurgical follow-up.  He is status post the above described procedure.  Overall he feels as though he is making steady progress in his overall recovery.  He has had some numbness over his incisions and upper portion of the right side of the abdomen.  He has also had some constipation which is improving over time.  He denies fevers, chills or other significant constitutional symptoms.  He is no longer using Ultram and takes occasional Tylenol.  He has had no significant difficulties with his incisions healing.  He is slowly building up his activity level.  He does have his own pulse oximeter and with ambulation he does occasionally get into the 80s.  A short rest typically brings his saturations back into the normal range.      Past Medical History:  Diagnosis Date  . Allergy   . Arthritis   . Barrett's esophagus   . CAD (coronary artery  disease)    a. Stent to the [place] (in [place]);  b. ETT(11/13/13): abnormal with early ST changes to suggest ischemia;   c. LHC (11/17/13):  pLAD 30-40, D1 30, mLAD 70-75, mCFX 70, mRCA stent 40 (ISR), EF 55-65%.  Medical Rx  . Cataract    left eye removed, right immature  . Colon polyps   . Dyslipidemia   . GERD (gastroesophageal reflux disease)   . Hypertension   . Myocardial infarction ([place]) 1996  . Nephrolithiasis    kidney stone  . PUD (peptic ulcer disease)      Social History   Tobacco Use  Smoking Status Former Smoker  . Packs/day: 1.00  . Years: 42.00  . Pack years: 42.00  . Types: Cigarettes  . Quit date: 06/21/2011  . Years since quitting: 8.6  Smokeless Tobacco Current User  . Types: Chew    Social History   Substance and Sexual Activity  Alcohol Use Yes  . Alcohol/week: 0.0 standard drinks   Comment: social     No Known Allergies  Current Outpatient Medications  Medication Sig Dispense Refill  . acetaminophen (TYLENOL) 500 MG tablet Take 500-1,000 mg by mouth every 6 (six) hours as needed (for pain.).    Marland Kitchen aspirin EC 81 MG tablet Take 81 mg by mouth 3 (three) times a week. In the morning    . atorvastatin (  LIPITOR) 40 MG tablet TAKE 1 TABLET DAILY (Patient taking differently: Take 40 mg by mouth daily. ) 90 tablet 3  . carvedilol (COREG) 12.5 MG tablet Take 12.5 mg by mouth daily.     . Coenzyme Q10 (CO Q-10) 100 MG CAPS Take 100 mg by mouth daily.     Marland Kitchen ezetimibe (ZETIA) 10 MG tablet Take 10 mg by mouth daily.    . fish oil-omega-3 fatty acids 1000 MG capsule Take 1,000 mg by mouth daily.     Marland Kitchen guaiFENesin (MUCINEX) 600 MG 12 hr tablet Take 1 tablet (600 mg total) by mouth 2 (two) times daily as needed for cough or to loosen phlegm.    Marland Kitchen ipratropium (ATROVENT) 0.03 % nasal spray Place 1 spray into both nostrils daily.     . Multiple Vitamin (MULTIVITAMIN WITH MINERALS) TABS tablet Take 1 tablet by mouth daily.    . nitroGLYCERIN (NITROLINGUAL) 0.4  MG/SPRAY spray USE 1 SPRAY ON OR UNDER THE TONGUE EVERY 5 MINUTES FOR 3 DOSES AS NEEDED FOR CHEST PAIN AS DIRECTED BY PRESCRIBER OR PACKAGE INSTRUCTIONS (Patient taking differently: Place 1 spray under the tongue every 5 (five) minutes x 3 doses as needed for chest pain. ) 24 g 3  . omeprazole (PRILOSEC) 10 MG capsule Take 10 mg by mouth daily.    Marland Kitchen oxybutynin (DITROPAN) 5 MG tablet Take 5 mg by mouth daily.     Marland Kitchen telmisartan-hydrochlorothiazide (MICARDIS HCT) 80-12.5 MG tablet Take 1 tablet by mouth daily. 90 tablet 3  . traMADol (ULTRAM) 50 MG tablet Take 1 tablet (50 mg total) by mouth every 6 (six) hours as needed (mild pain). 20 tablet 0   No current facility-administered medications for this visit.       Physical Exam: BP (!) 113/59 (BP Location: Right Arm, Patient Position: Sitting, Cuff Size: Normal)   Pulse 60   Temp 97.7 F (36.5 C) (Oral)   Resp 20   Ht 5\' 9"  (1.753 m)   Wt 90.7 kg   SpO2 97% Comment: RA  BMI 29.53 kg/m   General appearance: alert, cooperative and no distress Heart: regular rate and rhythm Lungs: clear to auscultation bilaterally Abdomen: His abdomen is somewhat distended.  Obese nontender.  He has hypoactive bowel sounds. Extremities: No edema Wound: Incisions healing well without evidence of infection     Correction- should say hyperactive Bowel sounds  Diagnostic Studies & Laboratory data:     Recent Radiology Findings:   DG Chest 2 View  Result Date: 02/12/2020 CLINICAL DATA:  Lung cancer, status post bronchoscopy and lymph node dissection 01/24/2020. Shortness of breath. EXAM: CHEST - 2 VIEW COMPARISON:  02/01/2020 FINDINGS: Stable right apical hydropneumothorax. Left lung is expanded. Mild right basilar atelectasis. Stable cardiomediastinal silhouette. No aggressive osseous lesion. IMPRESSION: Stable right apical hydropneumothorax. Electronically Signed   By: Kathreen Devoid   On: 02/12/2020 12:43      Recent Lab Findings: Lab Results   Component Value Date   WBC 6.6 01/29/2020   HGB 9.7 (L) 01/29/2020   HCT 30.8 (L) 01/29/2020   PLT 193 01/29/2020   GLUCOSE 114 (H) 01/29/2020   ALT 30 01/26/2020   AST 63 (H) 01/26/2020   NA 140 01/29/2020   K 3.5 01/29/2020   CL 104 01/29/2020   CREATININE 0.68 01/29/2020   BUN 17 01/29/2020   CO2 26 01/29/2020   INR 1.0 01/22/2020      Assessment / Plan: Overall, the patient is doing well.  His  chest x-ray appearance is very similar to his previous film at time of discharge.  It does show a persistent hydropneumothorax.  He is scheduled to see Dr. Servando Snare on 3 March and will have a repeat chest x-ray at that time.  He has had arrangements made to be seen by oncology.  We discussed activity progression.  Overall he is pleased with his progress.  I did not make any changes to his current medication regimen, although I did say he can use any of the over-the-counter laxatives or stool softeners on a as needed basis.      Medication Changes: No orders of the defined types were placed in this encounter.    John Giovanni, PA-C 02/12/2020 1:44 PM

## 2020-02-13 ENCOUNTER — Other Ambulatory Visit: Payer: Self-pay

## 2020-02-13 ENCOUNTER — Encounter: Payer: Self-pay | Admitting: Internal Medicine

## 2020-02-13 ENCOUNTER — Inpatient Hospital Stay: Payer: Medicare Other

## 2020-02-13 ENCOUNTER — Inpatient Hospital Stay: Payer: Medicare Other | Attending: Internal Medicine | Admitting: Internal Medicine

## 2020-02-13 VITALS — BP 111/55 | HR 62 | Temp 99.1°F | Resp 18 | Ht 69.0 in | Wt 206.4 lb

## 2020-02-13 DIAGNOSIS — E785 Hyperlipidemia, unspecified: Secondary | ICD-10-CM | POA: Diagnosis not present

## 2020-02-13 DIAGNOSIS — I2510 Atherosclerotic heart disease of native coronary artery without angina pectoris: Secondary | ICD-10-CM | POA: Diagnosis not present

## 2020-02-13 DIAGNOSIS — G44219 Episodic tension-type headache, not intractable: Secondary | ICD-10-CM

## 2020-02-13 DIAGNOSIS — Z87891 Personal history of nicotine dependence: Secondary | ICD-10-CM | POA: Insufficient documentation

## 2020-02-13 DIAGNOSIS — Z79899 Other long term (current) drug therapy: Secondary | ICD-10-CM | POA: Insufficient documentation

## 2020-02-13 DIAGNOSIS — Z902 Acquired absence of lung [part of]: Secondary | ICD-10-CM | POA: Diagnosis not present

## 2020-02-13 DIAGNOSIS — Z8249 Family history of ischemic heart disease and other diseases of the circulatory system: Secondary | ICD-10-CM | POA: Diagnosis not present

## 2020-02-13 DIAGNOSIS — Z7982 Long term (current) use of aspirin: Secondary | ICD-10-CM | POA: Diagnosis not present

## 2020-02-13 DIAGNOSIS — C3490 Malignant neoplasm of unspecified part of unspecified bronchus or lung: Secondary | ICD-10-CM

## 2020-02-13 DIAGNOSIS — I119 Hypertensive heart disease without heart failure: Secondary | ICD-10-CM | POA: Diagnosis not present

## 2020-02-13 DIAGNOSIS — C3411 Malignant neoplasm of upper lobe, right bronchus or lung: Secondary | ICD-10-CM | POA: Diagnosis not present

## 2020-02-13 DIAGNOSIS — Z5111 Encounter for antineoplastic chemotherapy: Secondary | ICD-10-CM | POA: Insufficient documentation

## 2020-02-13 DIAGNOSIS — I252 Old myocardial infarction: Secondary | ICD-10-CM | POA: Insufficient documentation

## 2020-02-13 DIAGNOSIS — K219 Gastro-esophageal reflux disease without esophagitis: Secondary | ICD-10-CM | POA: Diagnosis not present

## 2020-02-13 DIAGNOSIS — C3491 Malignant neoplasm of unspecified part of right bronchus or lung: Secondary | ICD-10-CM

## 2020-02-13 DIAGNOSIS — I10 Essential (primary) hypertension: Secondary | ICD-10-CM

## 2020-02-13 DIAGNOSIS — Z7189 Other specified counseling: Secondary | ICD-10-CM | POA: Insufficient documentation

## 2020-02-13 LAB — CMP (CANCER CENTER ONLY)
ALT: 16 U/L (ref 0–44)
AST: 13 U/L — ABNORMAL LOW (ref 15–41)
Albumin: 3.5 g/dL (ref 3.5–5.0)
Alkaline Phosphatase: 78 U/L (ref 38–126)
Anion gap: 7 (ref 5–15)
BUN: 22 mg/dL (ref 8–23)
CO2: 27 mmol/L (ref 22–32)
Calcium: 8.9 mg/dL (ref 8.9–10.3)
Chloride: 105 mmol/L (ref 98–111)
Creatinine: 0.83 mg/dL (ref 0.61–1.24)
GFR, Est AFR Am: >60 mL/min (ref >60)
GFR, Estimated: >60 mL/min (ref >60)
Glucose, Bld: 103 mg/dL — ABNORMAL HIGH (ref 70–99)
Potassium: 4.7 mmol/L (ref 3.5–5.1)
Sodium: 139 mmol/L (ref 135–145)
Total Bilirubin: 0.4 mg/dL (ref 0.3–1.2)
Total Protein: 6.4 g/dL — ABNORMAL LOW (ref 6.5–8.1)

## 2020-02-13 LAB — CBC WITH DIFFERENTIAL (CANCER CENTER ONLY)
Abs Immature Granulocytes: 0.04 10*3/uL (ref 0.00–0.07)
Basophils Absolute: 0.1 10*3/uL (ref 0.0–0.1)
Basophils Relative: 1 %
Eosinophils Absolute: 0.3 10*3/uL (ref 0.0–0.5)
Eosinophils Relative: 3 %
HCT: 35.9 % — ABNORMAL LOW (ref 39.0–52.0)
Hemoglobin: 11.4 g/dL — ABNORMAL LOW (ref 13.0–17.0)
Immature Granulocytes: 1 %
Lymphocytes Relative: 16 %
Lymphs Abs: 1.4 10*3/uL (ref 0.7–4.0)
MCH: 26.6 pg (ref 26.0–34.0)
MCHC: 31.8 g/dL (ref 30.0–36.0)
MCV: 83.9 fL (ref 80.0–100.0)
Monocytes Absolute: 0.8 10*3/uL (ref 0.1–1.0)
Monocytes Relative: 9 %
Neutro Abs: 6.3 10*3/uL (ref 1.7–7.7)
Neutrophils Relative %: 70 %
Platelet Count: 300 10*3/uL (ref 150–400)
RBC: 4.28 MIL/uL (ref 4.22–5.81)
RDW: 15.1 % (ref 11.5–15.5)
WBC Count: 8.8 10*3/uL (ref 4.0–10.5)
nRBC: 0 % (ref 0.0–0.2)

## 2020-02-13 NOTE — Progress Notes (Signed)
START OFF PATHWAY REGIMEN - Non-Small Cell Lung   [id number]:Carboplatin + Paclitaxel (6/225) q21d:   A cycle is every 21 days:     Paclitaxel      Carboplatin   **Always confirm dose/schedule in your pharmacy ordering system**  Patient Characteristics: Post-Neoadjuvant Therapy and Resection (Neoadjuvant Pathologic Staging), Stage II Therapeutic Status: Post-Neoadjuvant Therapy and Resection (Neoadjuvant Pathologic Staging) AJCC T Category: ypT1c AJCC N Category: ypN1 AJCC M Category: cM0 AJCC 8 Stage Grouping: IIB Intent of Therapy: Curative Intent, Discussed with Patient

## 2020-02-13 NOTE — Progress Notes (Signed)
[place] [phone]   [phone]  CONSULT NOTE  REFERRING PHYSICIAN: Dr. Lanelle Bal  REASON FOR CONSULTATION:  74 years old white male recently diagnosed with lung cancer.  HPI Daniel Colon is a 74 y.o. male with past medical history significant for hypertension, coronary artery disease status post myocardial infarction, dyslipidemia, GERD, Barrett's esophagus, kidney stones as well as history of peptic ulcer disease status post surgical resection.  The patient also has a long history of smoking.  He was seen by his primary care physician Dr. Sharlett Iles for routine evaluation and he had CT screening of the chest performed on December 27, 2019 and it showed a new 1.57 cm nodule in the peripheral aspect of the posterior segment of the right upper lobe.  This was followed by a PET scan on January 05, 2020 and that showed hypermetabolic perifissural right upper lobe pulmonary nodule measuring 1.4 cm with SUV max of 9.2.  The patient was referred to Dr. Servando Snare.  On January 24, 2020 he underwent bronchoscopy with robotic assisted wedge resection of the right upper lobe with completion of right upper lobectomy and lymph node dissection under the care of Dr. Servando Snare.  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  There was evidence for metastatic poorly differentiated carcinoma in 1 of 2 hilar lymph node.  The resection margin was negative.  There was some evidence for lymphovascular invasion as well as involvement of the subpleural connective tissue but no extension into the pleural surface. Dr. Servando Snare kindly referred the patient to me today for evaluation and recommendation regarding treatment of his condition. When seen today the patient continues to complain of soreness on the right side of the chest as well as shortness of breath with exertion with no cough or hemoptysis.  He  denied having any recent weight loss or night sweats.  He has no nausea, vomiting, diarrhea or constipation.  He has mild headache but no visual changes. Family history significant for mother died from heart disease during sleep, father died from natural causes at old age, 2 brother had cancer likely lung cancer. The patient is married and has 1 daughter.  He was accompanied today by his wife Daniel Colon.  He has a history of smoking up to 1.5 pack/day for around 40 years and quit recently.  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  He drinks alcohol occasionally and no history of drug abuse.  HPI  Past Medical History:  Diagnosis Date   Allergy    Arthritis    Barrett's esophagus    CAD (coronary artery disease)    a. Stent to the [place] (in [place]);  b. ETT(11/13/13): abnormal with early ST changes to suggest ischemia;   c. LHC (11/17/13):  pLAD 30-40, D1 30, mLAD 70-75, mCFX 70, mRCA stent 40 (ISR), EF 55-65%.  Medical Rx   Cataract    left eye removed, right immature   Colon polyps    Dyslipidemia    GERD (gastroesophageal reflux disease)    Hypertension    Myocardial infarction [facility]) 1996   Nephrolithiasis    kidney stone   PUD (peptic ulcer disease)     Past Surgical History:  Procedure Laterality Date   ANGIOPLASTY     stent - rt cor. art   BILROTH II PROCEDURE     COLONOSCOPY  INTERCOSTAL NERVE BLOCK Right 01/24/2020   Procedure: Intercostal Nerve Block;  Surgeon: Grace Isaac, MD;  Location: [facility];  Service: Thoracic;  Laterality: Right;   LEFT HEART CATHETERIZATION WITH CORONARY ANGIOGRAM N/A 11/17/2013   Procedure: LEFT HEART CATHETERIZATION WITH CORONARY ANGIOGRAM;  Surgeon: Blane Ohara, MD;  Location: [facility] CATH LAB;  Service: Cardiovascular;  Laterality: N/A;   LYMPH NODE DISSECTION N/A 01/24/2020   Procedure: LYMPH NODE DISSECTION;  Surgeon: Grace Isaac, MD;   Location: [facility] OR;  Service: Thoracic;  Laterality: N/A;   POLYPECTOMY     ulcer surgery     ULNAR NERVE TRANSPOSITION Right 08/05/2017   Procedure: RIGHT ULNAR NERVE DECOMPRESSION;  Surgeon: Leanora Cover, MD;  Location: [facility];  Service: Orthopedics;  Laterality: Right;   VIDEO BRONCHOSCOPY N/A 01/24/2020   Procedure: VIDEO BRONCHOSCOPY;  Surgeon: Grace Isaac, MD;  Location[facility];  Service: Thoracic;  Laterality: N/A;    Family History  Problem Relation Age of Onset   Sudden death Mother 56   CAD Brother 71       CABG   CAD Sister 71       CABG   CAD Sister 66       CABG   Cancer Brother    Liver disease Other    Colon cancer Neg Hx    Colon polyps Neg Hx    Esophageal cancer Neg Hx    Rectal cancer Neg Hx    Stomach cancer Neg Hx     Social History Social History   Tobacco Use   Smoking status: Former Smoker    Packs/day: 1.00    Years: 42.00    Pack years: 42.00    Types: Cigarettes    Quit date: 06/21/2011    Years since quitting: 8.6   Smokeless tobacco: Current User    Types: Chew  Substance Use Topics   Alcohol use: Yes    Alcohol/week: 0.0 standard drinks    Comment: social   Drug use: No    No Known Allergies  Current Outpatient Medications  Medication Sig Dispense Refill   aspirin EC 81 MG tablet Take 81 mg by mouth 3 (three) times a week. In the morning     atorvastatin (LIPITOR) 40 MG tablet TAKE 1 TABLET DAILY (Patient taking differently: Take 40 mg by mouth daily. ) 90 tablet 3   carvedilol (COREG) 12.5 MG tablet Take 12.5 mg by mouth daily.      Coenzyme Q10 (CO Q-10) 100 MG CAPS Take 100 mg by mouth daily.      ezetimibe (ZETIA) 10 MG tablet Take 10 mg by mouth daily.     fish oil-omega-3 fatty acids 1000 MG capsule Take 1,000 mg by mouth daily.      ipratropium (ATROVENT) 0.03 % nasal spray Place 1 spray into both nostrils daily.      Multiple Vitamin (MULTIVITAMIN WITH MINERALS) TABS tablet Take  1 tablet by mouth daily.     omeprazole (PRILOSEC) 10 MG capsule Take 10 mg by mouth daily.     oxybutynin (DITROPAN) 5 MG tablet Take 5 mg by mouth daily.      telmisartan-hydrochlorothiazide (MICARDIS HCT) 80-12.5 MG tablet Take 1 tablet by mouth daily. 90 tablet 3   acetaminophen (TYLENOL) 500 MG tablet Take 500-1,000 mg by mouth every 6 (six) hours as needed (for pain.).     guaiFENesin (MUCINEX) 600 MG 12 hr tablet Take 1 tablet (600 mg total) by mouth  2 (two) times daily as needed for cough or to loosen phlegm. (Patient not taking: Reported on 02/13/2020)     nitroGLYCERIN (NITROLINGUAL) 0.4 MG/SPRAY spray USE 1 SPRAY ON OR UNDER THE TONGUE EVERY 5 MINUTES FOR 3 DOSES AS NEEDED FOR CHEST PAIN AS DIRECTED BY PRESCRIBER OR PACKAGE INSTRUCTIONS (Patient not taking: No sig reported) 24 g 3   traMADol (ULTRAM) 50 MG tablet Take 1 tablet (50 mg total) by mouth every 6 (six) hours as needed (mild pain). (Patient not taking: Reported on 02/13/2020) 20 tablet 0   No current facility-administered medications for this visit.    Review of Systems  Constitutional: positive for fevers Eyes: negative Ears, nose, mouth, throat, and face: negative Respiratory: positive for dyspnea on exertion and pleurisy/chest pain Cardiovascular: negative Gastrointestinal: negative Genitourinary:negative Integument/breast: negative Hematologic/lymphatic: negative Musculoskeletal:negative Neurological: negative Behavioral/Psych: negative Endocrine: negative Allergic/Immunologic: negative  Physical Exam  DZH:GDJME, healthy, no distress, well nourished and well developed SKIN: skin color, texture, turgor are normal, no rashes or significant lesions HEAD: Normocephalic, No masses, lesions, tenderness or abnormalities EYES: normal, PERRLA, Conjunctiva are pink and non-injected EARS: External ears normal, Canals clear OROPHARYNX:no exudate, no erythema and lips, buccal mucosa, and tongue normal  NECK:  supple, no adenopathy, no JVD LYMPH:  no palpable lymphadenopathy, no hepatosplenomegaly LUNGS: clear to auscultation , and palpation HEART: regular rate & rhythm, no murmurs and no gallops ABDOMEN:abdomen soft, non-tender, normal bowel sounds and no masses or organomegaly BACK: No CVA tenderness, Range of motion is normal EXTREMITIES:no joint deformities, effusion, or inflammation, no edema  NEURO: alert & oriented x 3 with fluent speech, no focal motor/sensory deficits  PERFORMANCE STATUS: ECOG 1  LABORATORY DATA: Lab Results  Component Value Date   WBC 8.8 02/13/2020   HGB 11.4 (L) 02/13/2020   HCT 35.9 (L) 02/13/2020   MCV 83.9 02/13/2020   PLT 300 02/13/2020      Chemistry      Component Value Date/Time   NA 140 01/29/2020 0428   K 3.5 01/29/2020 0428   CL 104 01/29/2020 0428   CO2 26 01/29/2020 0428   BUN 17 01/29/2020 0428   CREATININE 0.68 01/29/2020 0428      Component Value Date/Time   CALCIUM 8.5 (L) 01/29/2020 0428   ALKPHOS 45 01/26/2020 0432   AST 63 (H) 01/26/2020 0432   ALT 30 01/26/2020 0432   BILITOT 0.7 01/26/2020 0432       RADIOGRAPHIC STUDIES: DG Chest 2 View  Result Date: 02/12/2020 CLINICAL DATA:  Lung cancer, status post bronchoscopy and lymph node dissection 01/24/2020. Shortness of breath. EXAM: CHEST - 2 VIEW COMPARISON:  02/01/2020 FINDINGS: Stable right apical hydropneumothorax. Left lung is expanded. Mild right basilar atelectasis. Stable cardiomediastinal silhouette. No aggressive osseous lesion. IMPRESSION: Stable right apical hydropneumothorax. Electronically Signed   By: Kathreen Devoid   On: 02/12/2020 12:43   DG Chest 2 View  Result Date: 02/01/2020 CLINICAL DATA:  Patient status post right upper lobectomy and lymph node dissection for carcinoma 01/25/2020. EXAM: CHEST - 2 VIEW COMPARISON:  Single view of the chest 01/31/2020 and 01/30/2020. FINDINGS: Right chest tube has been removed. Apical right hydropneumothorax is identified. In  addition to the air-fluid level at the base of the pneumothorax, one or two short air-fluid levels are seen within the resection cavity. The left lung is expanded and clear. Right basilar atelectasis noted. Heart size is normal. IMPRESSION: Status post right chest tube removal. Right apical hydropneumothorax is present. Pneumothorax component is not  notably changed. Air-fluid levels within the resection cavity are noted. Recommend attention on follow-up exams. Electronically Signed   By: Inge Rise M.D.   On: 02/01/2020 09:26   DG Chest 2 View  Result Date: 01/22/2020 CLINICAL DATA:  Preoperative evaluation. EXAM: CHEST - 2 VIEW COMPARISON:  Chest CT, dated December 27, 2019, is available for comparison. FINDINGS: A 1.4 cm ill-defined nodular opacity is seen overlying the lateral aspect of the mid to upper right lung. There is no evidence of a pleural effusion or pneumothorax. The heart size and mediastinal contours are within normal limits. There is mild calcification of the aortic arch. The visualized skeletal structures are unremarkable. IMPRESSION: 1. 1.4 cm ill-defined right upper lobe lung nodule which corresponds to the noncalcified lung nodule seen within this region on the prior chest CT dated December 27, 2019. Electronically Signed   By: Virgina Norfolk M.D.   On: 01/22/2020 21:44   DG CHEST PORT 1 VIEW  Result Date: 01/31/2020 CLINICAL DATA:  Shortness of breath, chest tube present. EXAM: PORTABLE CHEST 1 VIEW COMPARISON:  January 30, 2020. FINDINGS: Stable cardiomediastinal silhouette. Stable position of right-sided chest tube. Stable moderate size right apical pneumothorax is noted. Left lung is clear. Minimal left pleural effusion is again noted. The visualized skeletal structures are unremarkable. IMPRESSION: Stable position of right-sided chest tube. Stable moderate size right apical pneumothorax. Minimal left pleural effusion. Electronically Signed   By: Marijo Conception M.D.   On:  01/31/2020 09:17   DG Chest Port 1 View  Result Date: 01/30/2020 CLINICAL DATA:  Chest tube placement. EXAM: PORTABLE CHEST 1 VIEW COMPARISON:  January 29, 2020. FINDINGS: Stable cardiomediastinal silhouette. Minimal left pleural effusion is noted. Stable position of right-sided chest tube. Stable moderate size right apical pneumothorax is noted. Right internal jugular catheter has been removed. Bony thorax is unremarkable. IMPRESSION: Stable moderate size right apical pneumothorax. Stable position of right-sided chest tube. Minimal left pleural effusion. Right internal jugular catheter has been removed. Electronically Signed   By: Marijo Conception M.D.   On: 01/30/2020 09:31   DG CHEST PORT 1 VIEW  Result Date: 01/29/2020 CLINICAL DATA:  74 year old male postoperative day 5 right upper lobectomy for pulmonary nodule. EXAM: PORTABLE CHEST 1 VIEW COMPARISON:  Portable chest 01/28/2020 and earlier. FINDINGS: Portable AP semi upright view at 0503 hours. Stable right chest tube and right IJ central line. Unchanged superior right pneumothorax, with staple line along the pleural margin adjacent to the right hilum. Right chest wall subcutaneous gas is stable. Residual right lung aeration is stable. Stable cardiac size and mediastinal contours. Left lung remains clear allowing for portable technique. Visualized tracheal air column is within normal limits. Negative visible bowel gas pattern. IMPRESSION: 1. Stable superior pneumothorax following right upper lobectomy. 2.  Stable lines and tubes. Electronically Signed   By: Genevie Ann M.D.   On: 01/29/2020 06:02   DG CHEST PORT 1 VIEW  Result Date: 01/28/2020 CLINICAL DATA:  Pneumothorax EXAM: PORTABLE CHEST 1 VIEW COMPARISON:  Earlier today FINDINGS: Right chest tube in place. Mildly decreased right pneumothorax which measures 6.7 cm craniocaudal as opposed to 7.6 cm previously. Lung volumes are low. Postoperative distortion of the right hilum. Normal heart size.  IMPRESSION: Very mild decrease in the right pneumothorax. Electronically Signed   By: Monte Fantasia M.D.   On: 01/28/2020 14:14   DG CHEST PORT 1 VIEW  Result Date: 01/28/2020 CLINICAL DATA:  S/p RIGHT VATS and lobectomy. EXAM: PORTABLE  CHEST 1 VIEW COMPARISON:  01/27/2020 FINDINGS: RIGHT apical pneumothorax is increased in size, now moderate and approximately 30%. A RIGHT thoracostomy catheter is unchanged. RIGHT subcutaneous emphysema again noted. A RIGHT IJ central venous catheter with tip overlying the LOWER SVC and mild LEFT basilar atelectasis noted. Surgical sutures at the RIGHT lung apex identified. No other significant change. IMPRESSION: 1. Increased RIGHT apical pneumothorax, now moderate and approximately 30%. 2. No other significant change. These results will be called to the ordering clinician or representative by the Radiologist Assistant, and communication documented in the PACS or zVision Dashboard. Electronically Signed   By: Margarette Canada M.D.   On: 01/28/2020 06:04   DG CHEST PORT 1 VIEW  Result Date: 01/27/2020 CLINICAL DATA:  Pneumothorax. EXAM: PORTABLE CHEST 1 VIEW COMPARISON:  January 26, 2020. FINDINGS: Stable cardiomediastinal silhouette. Left lung is clear. Right-sided chest tube is unchanged in position. Stable right apical pneumothorax is noted compared to prior exam. Right internal jugular catheter is unchanged. Stable subcutaneous emphysema seen over right lateral chest wall. Bony thorax is unremarkable. IMPRESSION: Stable right apical pneumothorax is noted compared to prior exam. Right-sided chest tube is unchanged in position. Electronically Signed   By: Marijo Conception M.D.   On: 01/27/2020 09:45   DG CHEST PORT 1 VIEW  Result Date: 01/26/2020 CLINICAL DATA:  Pneumothorax and chest tube. EXAM: PORTABLE CHEST 1 VIEW COMPARISON:  01/25/2020 FINDINGS: Right chest tube is in stable position with the tip near the apex of the right chest. The right pneumothorax has decreased in  size. There continues to be a 25% pneumothorax. Persistent densities in the right lung compatible with atelectasis. Overall, low lung volumes. Linear densities in the left lower lung are suggestive for atelectasis or scarring. Heart and mediastinum are within normal limits and stable. Right jugular central line at the superior cavoatrial junction. Again noted is subcutaneous gas in the right chest. IMPRESSION: Right pneumothorax has decreased in size. Residual 25% pneumothorax. Stable position of the right chest tube. Electronically Signed   By: Markus Daft M.D.   On: 01/26/2020 08:39   DG CHEST PORT 1 VIEW  Result Date: 01/25/2020 CLINICAL DATA:  Lobectomy.  Chest tube. EXAM: PORTABLE CHEST 1 VIEW COMPARISON:  01/24/2020. FINDINGS: Right IJ line and right chest tube in stable position. Previously identified prominent right pneumothorax again noted, interim progression. Postsurgical changes right lung. Low lung volumes. Stable cardiomegaly. Right chest wall subcutaneous emphysema again noted. IMPRESSION: 1. Right IJ line right chest tube in stable position. Previously identified prominent right pneumothorax is again noted. Interim progression from prior exam. Postsurgical changes right lung. Right chest wall subcutaneous emphysema again noted. 2.  Stable cardiomegaly. Electronically Signed   By: Marcello Moores  Register   On: 01/25/2020 08:36   DG Chest Port 1 View  Result Date: 01/24/2020 CLINICAL DATA:  Status post lobectomy.  Chest tube in place. EXAM: PORTABLE CHEST 1 VIEW COMPARISON:  01/22/2020 FINDINGS: The patient is status post lobectomy. A right-sided chest tube is in place. There is a large right-sided pneumothorax. There is a right-sided central venous catheter with tip projecting over the cavoatrial junction. Subcutaneous gas is noted along the patient's right flank. The heart size is normal. Aortic calcifications are noted. IMPRESSION: 1. Lines and tubes as above. 2. Large right-sided pneumothorax despite  chest tube placement. 3. Postsurgical changes related to recent lobectomy. These results will be called to the ordering clinician or representative by the Radiologist Assistant, and communication documented in the PACS or  zVision Dashboard. Electronically Signed   By: Constance Holster M.D.   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 Servando Snare   PLAN: I had a lengthy discussion with the patient and his wife today about his current disease a stage, prognosis and treatment options. I recommended for the patient to complete the staging work-up and I will order CT scan of the head with and without contrast to rule out any brain metastasis. I also discussed with the patient his adjuvant systemic therapy which add an absolute survival benefit of 5 years of around 5-10%. I discussed with the patient the adverse effect of a platinum-based chemotherapy including but not limited to alopecia, myelosuppression, nausea and vomiting, peripheral neuropathy, liver or renal dysfunction. The patient is interested in the treatment and I will start him on systemic chemotherapy with carboplatin for AUC of 6 and paclitaxel 200 mg/M2 every 3 weeks with Neulasta support. The patient is expected to start the first cycle of this treatment on February 28, 2020. He will have a chemotherapy education class before the first dose of his treatment. He is expected to have his Covid vaccine next week. I will send prescription for Compazine 10 mg p.o. every 6 hours as needed for nausea to his pharmacy. We will see the patient back for follow-up visit in 1 week after his treatment for management of any adverse effect of his treatment. I strongly recommend for the patient to continue with  the smoke cessation. For the hypertension and coronary artery disease, he will continue with his current home medications for now. The patient was advised to call immediately if he has any other concerning symptoms in the interval. The patient voices understanding of current disease status and treatment options and is in agreement with the current care plan.  All questions were answered. The patient knows to call the clinic with any problems, questions or concerns. We can certainly see the patient much sooner if necessary.  Thank you so much for allowing me to participate in the care of Daniel Colon. I will continue to follow up the patient with you and assist in his care.  The total time spent in the appointment was 80 minutes.  Disclaimer: This note was dictated with voice recognition software. Similar sounding words can inadvertently be transcribed and may not be corrected upon review.   Eilleen Kempf February 13, 2020, 2:20 PM

## 2020-02-14 ENCOUNTER — Telehealth: Payer: Self-pay | Admitting: Internal Medicine

## 2020-02-14 NOTE — Telephone Encounter (Signed)
Scheduled per los. Called and spoke with patient. Confirmed appts  

## 2020-02-16 ENCOUNTER — Other Ambulatory Visit: Payer: Self-pay

## 2020-02-16 ENCOUNTER — Inpatient Hospital Stay: Payer: Medicare Other

## 2020-02-16 ENCOUNTER — Encounter: Payer: Self-pay | Admitting: Internal Medicine

## 2020-02-16 NOTE — Progress Notes (Signed)
Met with patient at registration to introduce myself as Arboriculturist and to offer available resources.  Patient has 2 insurances therefore no copay assistance will be needed.  Discussed one-time $1000 Radio broadcast assistant to assist with personal expenses while going through treatment.  Gave my card if interested in applying and for any additional financial questions or concerns.

## 2020-02-21 ENCOUNTER — Other Ambulatory Visit: Payer: Self-pay | Admitting: Cardiothoracic Surgery

## 2020-02-21 DIAGNOSIS — C3490 Malignant neoplasm of unspecified part of unspecified bronchus or lung: Secondary | ICD-10-CM

## 2020-02-22 ENCOUNTER — Ambulatory Visit (INDEPENDENT_AMBULATORY_CARE_PROVIDER_SITE_OTHER): Payer: Self-pay | Admitting: Cardiothoracic Surgery

## 2020-02-22 ENCOUNTER — Other Ambulatory Visit: Payer: Self-pay | Admitting: Medical Oncology

## 2020-02-22 ENCOUNTER — Ambulatory Visit
Admission: RE | Admit: 2020-02-22 | Discharge: 2020-02-22 | Disposition: A | Discharge status: Home / Self Care | Payer: Medicare Other | Source: Ambulatory Visit | Attending: Cardiothoracic Surgery | Admitting: Cardiothoracic Surgery

## 2020-02-22 ENCOUNTER — Other Ambulatory Visit: Payer: Self-pay

## 2020-02-22 ENCOUNTER — Encounter: Payer: Self-pay | Admitting: Internal Medicine

## 2020-02-22 VITALS — BP 91/49 | HR 67 | Temp 98.7°F | Resp 24 | Ht 68.5 in | Wt 204.0 lb

## 2020-02-22 DIAGNOSIS — Z5111 Encounter for antineoplastic chemotherapy: Secondary | ICD-10-CM

## 2020-02-22 DIAGNOSIS — C3490 Malignant neoplasm of unspecified part of unspecified bronchus or lung: Secondary | ICD-10-CM

## 2020-02-22 DIAGNOSIS — Z09 Encounter for follow-up examination after completed treatment for conditions other than malignant neoplasm: Secondary | ICD-10-CM

## 2020-02-22 MED ORDER — PROCHLORPERAZINE MALEATE 10 MG PO TABS: 10.0000 mg | ORAL_TABLET | Freq: Four times a day (QID) | ORAL | 0 refills | Status: DC | PRN

## 2020-02-22 NOTE — Progress Notes (Signed)
Please see 01/03/2020 OV note.

## 2020-02-22 NOTE — Progress Notes (Signed)
[address]       ,[place] [address]             [phone]                  Daniel Colon Medical Record #[id number] Date of Birth: [birth year]  Referring HW:EXHBZ, Lars Masson, NP Primary Cardiology: Primary Care:Paterson, Quillian Quince, MD  Chief Complaint:  Follow Up Visit OPERATIVE REPORT DATE OF PROCEDURE:  01/24/2020 PREOPERATIVE DIAGNOSIS:  A 2 cm right upper lobe lung mass found on surveillance lung cancer screening CT. POSTOPERATIVE DIAGNOSES:   1.  Malignant neoplasm, right upper lobe, 2.  Sarcomatous changes on frozen section. PROCEDURES PERFORMED:   1.  Bronchoscopy.   2.  Robotic-assisted wedge resection- right upper lobe with completion right upper lobe lobectomy, lymph node dissection, and intercostal nerve block. SURGEON:  Lanelle Bal, MD  Cancer Staging Lung cancer [facility]) Staging form: Lung, AJCC 8th Edition - Pathologic stage from 01/29/2020: Stage IIB (pT1c, pN1, cM0) - Signed by Grace Isaac, MD on 01/29/2020  SURGICAL PATHOLOGY  CASE: [id number]  PATIENT: Daniel Colon  Surgical Pathology Report   Clinical History: pulmonary nodule, subpleural nodule in right upper  lobe (cm)   FINAL MICROSCOPIC DIAGNOSIS:   A. LUNG, RIGHT UPPER LOBE, WEDGE RESECTION:  - Poorly differentiated carcinoma with sarcomatoid features, 2.2 cm.  - See oncology table and comment.   B. LYMPH NODE, LEVEL 7, EXCISION:  - Anthracotic lymph node with no metastatic carcinoma.   C. LYMPH NODE, 10R, EXCISION:  - Anthracotic lymph node with no metastatic carcinoma.   D. LYMPH NODE, 10R #2, EXCISION:  - Anthracotic lymph node with no metastatic carcinoma.   E. LYMPH NODE, 12R, EXCISION:  - Anthracotic lymph node with no metastatic carcinoma.   F. LYMPH NODE, 10R #3, EXCISION:  - Anthracotic lymph node with no metastatic carcinoma.   G. LYMPH NODE, 10R #4, EXCISION:  - Anthracotic lymph node with no metastatic carcinoma.   H. LYMPH  NODE, 11R, EXCISION:  - Anthracotic lymph node with no metastatic carcinoma.   I. LYMPH NODE, 11R #2, EXCISION:  - Anthracotic lymph node with no metastatic carcinoma.   J. LYMPH NODE, 11R #3, EXCISION:  - Anthracotic lymph node with no metastatic carcinoma.   K. LYMPH NODE, 11R #4, EXCISION:  - Anthracotic lymph node with no metastatic carcinoma.   L. LUNG, RIGHT UPPER LOBE COMPLETION, LOBECTOMY:  - Findings consistent with previous wedge biopsy.  - Final margins free of tumor.  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   ONCOLOGY TABLE:  LUNG: Resection  Procedure: Right upper lobe wedge biopsy, right upper lobectomy and  lymph node biopsies.  Specimen Laterality: Right  Tumor Site: Right upper lobe.  Tumor Size: 2.2 x 1.5 x 0.8 cm.  Tumor Focality: Unifocal.  Histologic Type: Poorly differentiated carcinoma with f features  (sarcomatoid carcinoma).  Visceral Pleura Invasion: Involves subpleural connective tissue but does  not extend to pleural surface.  Lymphovascular Invasion: Present.  Direct Invasion of Adjacent Structures: No adjacent structures  submitted.  Margins: Free of tumor.  Treatment Effect: No known presurgical therapy.  Regional Lymph Nodes:    Number of Lymph Nodes Involved: 1    Number of Lymph Nodes Examined: 12  Pathologic Stage Classification (pTNM, AJCC 8th Edition): pT1c, pN1  Ancillary Studies: Can be performed if requested.  Representative Tumor Block: A2, A3 and  A4.  Comment(s): The tumor is poorly differentiated with large areas of  diffuse sheets of malignant cells some of which have spindle cell  features with marked nuclear pleomorphism and frequent mitotic figures.  There are areas with epithelioid features with abundant eosinophilic  cytoplasm. Immunohistochemistry shows positivity with cytokeratin  AE1/AE3, cytokeratin 7 and TTF-1. The tumor is negative with  cytokeratin 5/6, p40, p63,  Napsin-A, MOC31, cytokeratin 20, and CDX-2.  The immunophenotype is consistent with poorly differentiated carcinoma  and the morphology shows sarcomatoid features (sarcomatoid carcinoma).   Dr. Tresa Moore has reviewed slides of the tumor and agrees.  (v4.1.0.1)   History of Present Illness:     Patient returns to the office today with a follow-up chest x-ray after recent robotic right upper lobectomy for a poorly differentiated 2 cm right upper lobe mass with single positive hilar lymph node.  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   He notes paresthesias around the right lower chest and extending to the right upper abdomen.  He has had some shortness of breath but notes that he is walking 2 to 3 miles a day without difficulty, and wants to go and try to play golf today.  With the positive hilar lymph node he was referred to medical oncology-seen by Dr. Earlie Server and is to start chemotherapy treatments next week.      Zubrod Score: At the time of surgery this patient's most appropriate activity status/level should be described as: []     0    Normal activity, no symptoms [x]     1    Restricted in physical strenuous activity but ambulatory, able to do out light work []     2    Ambulatory and capable of self care, unable to do work activities, up and about                 >50 % of waking hours                                                                                   []     3    Only limited self care, in bed greater than 50% of waking hours []     4    Completely disabled, no self care, confined to bed or chair []     5    Moribund  Social History   Tobacco Use  Smoking Status Former Smoker  . Packs/day: 1.00  . Years: 42.00  . Pack years: 42.00  . Types: Cigarettes  . Quit date: 06/21/2011  . Years since quitting: 8.6  Smokeless Tobacco Current User  . Types: Chew       No Known  Allergies  Current Outpatient Medications  Medication Sig Dispense Refill  . acetaminophen (TYLENOL) 500 MG tablet Take 500-1,000 mg by mouth every 6 (six) hours as needed (for pain.).    Marland Kitchen aspirin EC 81 MG tablet Take 81 mg by mouth 3 (three) times a week. In the morning    . atorvastatin (LIPITOR) 40 MG tablet TAKE 1 TABLET DAILY (Patient taking differently: Take 40  mg by mouth daily. ) 90 tablet 3  . carvedilol (COREG) 12.5 MG tablet Take 12.5 mg by mouth daily.     . Coenzyme Q10 (CO Q-10) 100 MG CAPS Take 100 mg by mouth daily.     Marland Kitchen ezetimibe (ZETIA) 10 MG tablet Take 10 mg by mouth daily.    . fish oil-omega-3 fatty acids 1000 MG capsule Take 1,000 mg by mouth daily.     Marland Kitchen guaiFENesin (MUCINEX) 600 MG 12 hr tablet Take 1 tablet (600 mg total) by mouth 2 (two) times daily as needed for cough or to loosen phlegm.    Marland Kitchen ipratropium (ATROVENT) 0.03 % nasal spray Place 1 spray into both nostrils daily.     . Multiple Vitamin (MULTIVITAMIN WITH MINERALS) TABS tablet Take 1 tablet by mouth daily.    . nitroGLYCERIN (NITROLINGUAL) 0.4 MG/SPRAY spray USE 1 SPRAY ON OR UNDER THE TONGUE EVERY 5 MINUTES FOR 3 DOSES AS NEEDED FOR CHEST PAIN AS DIRECTED BY PRESCRIBER OR PACKAGE INSTRUCTIONS 24 g 3  . omeprazole (PRILOSEC) 10 MG capsule Take 10 mg by mouth daily.    Marland Kitchen oxybutynin (DITROPAN) 5 MG tablet Take 5 mg by mouth daily.     Marland Kitchen telmisartan-hydrochlorothiazide (MICARDIS HCT) 80-12.5 MG tablet Take 1 tablet by mouth daily. 90 tablet 3  . traMADol (ULTRAM) 50 MG tablet Take 1 tablet (50 mg total) by mouth every 6 (six) hours as needed (mild pain). 20 tablet 0   No current facility-administered medications for this visit.       Physical Exam: BP (!) 91/49 (BP Location: Right Arm)   Pulse 67   Temp 98.7 F (37.1 C) (Oral)   Resp (!) 24   Ht 5' 8.5" (1.74 m)   Wt 204 lb (92.5 kg)   SpO2 93% Comment: RA  BMI 30.57 kg/m   General appearance: alert, cooperative and no distress Neurologic:  intact Heart: regular rate and rhythm, S1, S2 normal, no murmur, click, rub or gallop Lungs: clear to auscultation bilaterally Abdomen: soft, non-tender; bowel sounds normal; no masses,  no organomegaly Extremities: extremities normal, atraumatic, no cyanosis or edema and Homans sign is negative, no sign of DVT Wound: Port incisions are well-healed on the right chest   Diagnostic Studies & Laboratory data:         Recent Radiology Findings: DG Chest 2 View  Result Date: 02/22/2020 CLINICAL DATA:  Lung cancer, status post bronchoscopy EXAM: CHEST - 2 VIEW COMPARISON:  02/12/2020, 02/01/2020 FINDINGS: Stable right apical loculated hydropneumothorax. Left lung is expanded. Mild right basilar atelectasis. Stable cardiomediastinal silhouette. No aggressive osseous lesion. IMPRESSION: Stable right apical loculated hydropneumothorax. Electronically Signed   By: Kathreen Devoid   On: 02/22/2020 12:20      I have independently reviewed the above radiology findings and reviewed findings  with the patient.  Recent Labs: Lab Results  Component Value Date   WBC 8.8 02/13/2020   HGB 11.4 (L) 02/13/2020   HCT 35.9 (L) 02/13/2020   PLT 300 02/13/2020   GLUCOSE 103 (H) 02/13/2020   ALT 16 02/13/2020   AST 13 (L) 02/13/2020   NA 139 02/13/2020   K 4.7 02/13/2020   CL 105 02/13/2020   CREATININE 0.83 02/13/2020   BUN 22 02/13/2020   CO2 27 02/13/2020   INR 1.0 01/22/2020      Assessment / Plan:   Patient stable after robotic right upper lobectomy and lymph node dissection without with what ultimately was found to be poorly  differentiated carcinoma with sarcomatous features,-stage IIb.  The patient has a persistent airspace right upper lung zone, this is decreased in size since initial discharge from hospital.   Patient is to start chemotherapy next week   We will plan to see him back in 4 to 5 weeks with a follow-up chest x-ray    Medication Changes: No orders of the defined types were  placed in this encounter.      Grace Isaac 02/22/2020 12:58 PM

## 2020-02-22 NOTE — Progress Notes (Signed)
See OV note Dr. Servando Snare and OR note. Pt. Had had wedge resection and lobectomy and has been referred to oncology

## 2020-02-23 ENCOUNTER — Encounter (HOSPITAL_COMMUNITY): Payer: Self-pay

## 2020-02-23 ENCOUNTER — Ambulatory Visit (HOSPITAL_COMMUNITY)
Admission: RE | Admit: 2020-02-23 | Discharge: 2020-02-23 | Disposition: A | Discharge status: Home / Self Care | Payer: Medicare Other | Source: Ambulatory Visit | Attending: Internal Medicine | Admitting: Internal Medicine

## 2020-02-23 DIAGNOSIS — C3490 Malignant neoplasm of unspecified part of unspecified bronchus or lung: Secondary | ICD-10-CM | POA: Diagnosis not present

## 2020-02-23 DIAGNOSIS — G44219 Episodic tension-type headache, not intractable: Secondary | ICD-10-CM | POA: Insufficient documentation

## 2020-02-23 DIAGNOSIS — R29818 Other symptoms and signs involving the nervous system: Secondary | ICD-10-CM | POA: Diagnosis not present

## 2020-02-23 MED ORDER — IOHEXOL 300 MG/ML  SOLN
75.0000 mL | Freq: Once | INTRAMUSCULAR | Status: AC | PRN
  Administered 2020-02-23: 75 mL via INTRAVENOUS

## 2020-02-23 MED ORDER — SODIUM CHLORIDE (PF) 0.9 % IJ SOLN
INTRAMUSCULAR | Status: AC
  Filled 2020-02-23: qty 50

## 2020-02-28 ENCOUNTER — Inpatient Hospital Stay: Payer: Medicare Other

## 2020-02-28 ENCOUNTER — Inpatient Hospital Stay: Payer: Medicare Other | Attending: Internal Medicine

## 2020-02-28 ENCOUNTER — Other Ambulatory Visit: Payer: Self-pay

## 2020-02-28 VITALS — BP 119/67 | HR 55 | Temp 98.6°F | Resp 18

## 2020-02-28 DIAGNOSIS — K5900 Constipation, unspecified: Secondary | ICD-10-CM | POA: Diagnosis not present

## 2020-02-28 DIAGNOSIS — Z902 Acquired absence of lung [part of]: Secondary | ICD-10-CM | POA: Diagnosis not present

## 2020-02-28 DIAGNOSIS — Z5189 Encounter for other specified aftercare: Secondary | ICD-10-CM | POA: Insufficient documentation

## 2020-02-28 DIAGNOSIS — Z5111 Encounter for antineoplastic chemotherapy: Secondary | ICD-10-CM | POA: Diagnosis not present

## 2020-02-28 DIAGNOSIS — Z79899 Other long term (current) drug therapy: Secondary | ICD-10-CM | POA: Insufficient documentation

## 2020-02-28 DIAGNOSIS — C3490 Malignant neoplasm of unspecified part of unspecified bronchus or lung: Secondary | ICD-10-CM

## 2020-02-28 DIAGNOSIS — Z8711 Personal history of peptic ulcer disease: Secondary | ICD-10-CM | POA: Insufficient documentation

## 2020-02-28 DIAGNOSIS — C3411 Malignant neoplasm of upper lobe, right bronchus or lung: Secondary | ICD-10-CM | POA: Diagnosis not present

## 2020-02-28 DIAGNOSIS — I10 Essential (primary) hypertension: Secondary | ICD-10-CM | POA: Diagnosis not present

## 2020-02-28 DIAGNOSIS — C3491 Malignant neoplasm of unspecified part of right bronchus or lung: Secondary | ICD-10-CM

## 2020-02-28 DIAGNOSIS — I252 Old myocardial infarction: Secondary | ICD-10-CM | POA: Insufficient documentation

## 2020-02-28 DIAGNOSIS — I2510 Atherosclerotic heart disease of native coronary artery without angina pectoris: Secondary | ICD-10-CM | POA: Insufficient documentation

## 2020-02-28 DIAGNOSIS — E785 Hyperlipidemia, unspecified: Secondary | ICD-10-CM | POA: Diagnosis not present

## 2020-02-28 DIAGNOSIS — Z9001 Acquired absence of eye: Secondary | ICD-10-CM | POA: Diagnosis not present

## 2020-02-28 DIAGNOSIS — Z7982 Long term (current) use of aspirin: Secondary | ICD-10-CM | POA: Diagnosis not present

## 2020-02-28 LAB — CBC WITH DIFFERENTIAL (CANCER CENTER ONLY)
Abs Immature Granulocytes: 0.05 10*3/uL (ref 0.00–0.07)
Basophils Absolute: 0 10*3/uL (ref 0.0–0.1)
Basophils Relative: 1 %
Eosinophils Absolute: 0.2 10*3/uL (ref 0.0–0.5)
Eosinophils Relative: 2 %
HCT: 34.9 % — ABNORMAL LOW (ref 39.0–52.0)
Hemoglobin: 11.2 g/dL — ABNORMAL LOW (ref 13.0–17.0)
Immature Granulocytes: 1 %
Lymphocytes Relative: 16 %
Lymphs Abs: 1.3 10*3/uL (ref 0.7–4.0)
MCH: 26 pg (ref 26.0–34.0)
MCHC: 32.1 g/dL (ref 30.0–36.0)
MCV: 81.2 fL (ref 80.0–100.0)
Monocytes Absolute: 0.8 10*3/uL (ref 0.1–1.0)
Monocytes Relative: 10 %
Neutro Abs: 5.6 10*3/uL (ref 1.7–7.7)
Neutrophils Relative %: 70 %
Platelet Count: 210 10*3/uL (ref 150–400)
RBC: 4.3 MIL/uL (ref 4.22–5.81)
RDW: 15.5 % (ref 11.5–15.5)
WBC Count: 7.9 10*3/uL (ref 4.0–10.5)
nRBC: 0 % (ref 0.0–0.2)

## 2020-02-28 LAB — CMP (CANCER CENTER ONLY)
ALT: 12 U/L (ref 0–44)
AST: 13 U/L — ABNORMAL LOW (ref 15–41)
Albumin: 3.3 g/dL — ABNORMAL LOW (ref 3.5–5.0)
Alkaline Phosphatase: 65 U/L (ref 38–126)
Anion gap: 8 (ref 5–15)
BUN: 23 mg/dL (ref 8–23)
CO2: 24 mmol/L (ref 22–32)
Calcium: 8.4 mg/dL — ABNORMAL LOW (ref 8.9–10.3)
Chloride: 107 mmol/L (ref 98–111)
Creatinine: 0.89 mg/dL (ref 0.61–1.24)
GFR, Est AFR Am: >60 mL/min (ref >60)
GFR, Estimated: >60 mL/min (ref >60)
Glucose, Bld: 112 mg/dL — ABNORMAL HIGH (ref 70–99)
Potassium: 4.4 mmol/L (ref 3.5–5.1)
Sodium: 139 mmol/L (ref 135–145)
Total Bilirubin: 0.4 mg/dL (ref 0.3–1.2)
Total Protein: 5.7 g/dL — ABNORMAL LOW (ref 6.5–8.1)

## 2020-02-28 MED ORDER — DIPHENHYDRAMINE HCL 50 MG/ML IJ SOLN
50.0000 mg | Freq: Once | INTRAMUSCULAR | Status: AC
  Administered 2020-02-28: 50 mg via INTRAVENOUS

## 2020-02-28 MED ORDER — FAMOTIDINE IN NACL 20-0.9 MG/50ML-% IV SOLN
20.0000 mg | Freq: Once | INTRAVENOUS | Status: AC
  Administered 2020-02-28: 20 mg via INTRAVENOUS

## 2020-02-28 MED ORDER — FAMOTIDINE IN NACL 20-0.9 MG/50ML-% IV SOLN
INTRAVENOUS | Status: AC
  Filled 2020-02-28: qty 50

## 2020-02-28 MED ORDER — SODIUM CHLORIDE 0.9 % IV SOLN
Freq: Once | INTRAVENOUS | Status: AC
  Filled 2020-02-28: qty 250

## 2020-02-28 MED ORDER — DIPHENHYDRAMINE HCL 50 MG/ML IJ SOLN
INTRAMUSCULAR | Status: AC
  Filled 2020-02-28: qty 1

## 2020-02-28 MED ORDER — SODIUM CHLORIDE 0.9 % IV SOLN
150.0000 mg | Freq: Once | INTRAVENOUS | Status: AC
  Administered 2020-02-28: 150 mg via INTRAVENOUS
  Filled 2020-02-28: qty 150

## 2020-02-28 MED ORDER — PALONOSETRON HCL INJECTION 0.25 MG/5ML
INTRAVENOUS | Status: AC
  Filled 2020-02-28: qty 5

## 2020-02-28 MED ORDER — SODIUM CHLORIDE 0.9 % IV SOLN
200.0000 mg/m2 | Freq: Once | INTRAVENOUS | Status: AC
  Administered 2020-02-28: 426 mg via INTRAVENOUS
  Filled 2020-02-28: qty 71

## 2020-02-28 MED ORDER — PALONOSETRON HCL INJECTION 0.25 MG/5ML
0.2500 mg | Freq: Once | INTRAVENOUS | Status: AC
  Administered 2020-02-28: 0.25 mg via INTRAVENOUS

## 2020-02-28 MED ORDER — SODIUM CHLORIDE 0.9 % IV SOLN
688.2000 mg | Freq: Once | INTRAVENOUS | Status: AC
  Administered 2020-02-28: 690 mg via INTRAVENOUS
  Filled 2020-02-28: qty 69

## 2020-02-28 MED ORDER — DEXAMETHASONE SODIUM PHOSPHATE 10 MG/ML IJ SOLN
10.0000 mg | Freq: Once | INTRAMUSCULAR | Status: AC
  Administered 2020-02-28: 10 mg via INTRAVENOUS

## 2020-02-28 MED ORDER — DEXAMETHASONE SODIUM PHOSPHATE 10 MG/ML IJ SOLN
INTRAMUSCULAR | Status: AC
  Filled 2020-02-28: qty 1

## 2020-02-28 NOTE — Patient Instructions (Addendum)
[facility] Discharge Instructions for Patients Receiving Chemotherapy  Today you received the following chemotherapy agents: Taxol/Carboplatin.  To help prevent nausea and vomiting after your treatment, we encourage you to take your nausea medication as directed.   If you develop nausea and vomiting that is not controlled by your nausea medication, call the clinic.   BELOW ARE SYMPTOMS THAT SHOULD BE REPORTED IMMEDIATELY:  *FEVER GREATER THAN 100.5 F  *CHILLS WITH OR WITHOUT FEVER  NAUSEA AND VOMITING THAT IS NOT CONTROLLED WITH YOUR NAUSEA MEDICATION  *UNUSUAL SHORTNESS OF BREATH  *UNUSUAL BRUISING OR BLEEDING  TENDERNESS IN MOUTH AND THROAT WITH OR WITHOUT PRESENCE OF ULCERS  *URINARY PROBLEMS  *BOWEL PROBLEMS  UNUSUAL RASH Items with * indicate a potential emergency and should be followed up as soon as possible.  Feel free to call the clinic should you have any questions or concerns. The clinic phone number is [phone].  Please show the [facility] at check-in to the Emergency Department and triage nurse.  Paclitaxel injection What is this medicine? PACLITAXEL (PAK li TAX el) is a chemotherapy drug. It targets fast dividing cells, like cancer cells, and causes these cells to die. This medicine is used to treat ovarian cancer, breast cancer, lung cancer, Kaposi's sarcoma, and other cancers. This medicine may be used for other purposes; ask your health care provider or pharmacist if you have questions. COMMON BRAND NAME(S): Onxol, Taxol What should I tell my health care provider before I take this medicine? They need to know if you have any of these conditions:  history of irregular heartbeat  liver disease  low blood counts, like low white cell, platelet, or red cell counts  lung or breathing disease, like asthma  tingling of the fingers or toes, or other nerve disorder  an unusual or allergic reaction to paclitaxel, alcohol,  polyoxyethylated castor oil, other chemotherapy, other medicines, foods, dyes, or preservatives  pregnant or trying to get pregnant  breast-feeding How should I use this medicine? This drug is given as an infusion into a vein. It is administered in a hospital or clinic by a specially trained health care professional. Talk to your pediatrician regarding the use of this medicine in children. Special care may be needed. Overdosage: If you think you have taken too much of this medicine contact a poison control center or emergency room at once. NOTE: This medicine is only for you. Do not share this medicine with others. What if I miss a dose? It is important not to miss your dose. Call your doctor or health care professional if you are unable to keep an appointment. What may interact with this medicine? Do not take this medicine with any of the following medications:  disulfiram  metronidazole This medicine may also interact with the following medications:  antiviral medicines for hepatitis, HIV or AIDS  certain antibiotics like erythromycin and clarithromycin  certain medicines for fungal infections like ketoconazole and itraconazole  certain medicines for seizures like carbamazepine, phenobarbital, phenytoin  gemfibrozil  nefazodone  rifampin  St. John's wort This list may not describe all possible interactions. Give your health care provider a list of all the medicines, herbs, non-prescription drugs, or dietary supplements you use. Also tell them if you smoke, drink alcohol, or use illegal drugs. Some items may interact with your medicine. What should I watch for while using this medicine? Your condition will be monitored carefully while you are receiving this medicine. You will need important blood work  done while you are taking this medicine. This medicine can cause serious allergic reactions. To reduce your risk you will need to take other medicine(s) before treatment with this  medicine. If you experience allergic reactions like skin rash, itching or hives, swelling of the face, lips, or tongue, tell your doctor or health care professional right away. In some cases, you may be given additional medicines to help with side effects. Follow all directions for their use. This drug may make you feel generally unwell. This is not uncommon, as chemotherapy can affect healthy cells as well as cancer cells. Report any side effects. Continue your course of treatment even though you feel ill unless your doctor tells you to stop. Call your doctor or health care professional for advice if you get a fever, chills or sore throat, or other symptoms of a cold or flu. Do not treat yourself. This drug decreases your body's ability to fight infections. Try to avoid being around people who are sick. This medicine may increase your risk to bruise or bleed. Call your doctor or health care professional if you notice any unusual bleeding. Be careful brushing and flossing your teeth or using a toothpick because you may get an infection or bleed more easily. If you have any dental work done, tell your dentist you are receiving this medicine. Avoid taking products that contain aspirin, acetaminophen, ibuprofen, naproxen, or ketoprofen unless instructed by your doctor. These medicines may hide a fever. Do not become pregnant while taking this medicine. Women should inform their doctor if they wish to become pregnant or think they might be pregnant. There is a potential for serious side effects to an unborn child. Talk to your health care professional or pharmacist for more information. Do not breast-feed an infant while taking this medicine. Men are advised not to father a child while receiving this medicine. This product may contain alcohol. Ask your pharmacist or healthcare provider if this medicine contains alcohol. Be sure to tell all healthcare providers you are taking this medicine. Certain medicines,  like metronidazole and disulfiram, can cause an unpleasant reaction when taken with alcohol. The reaction includes flushing, headache, nausea, vomiting, sweating, and increased thirst. The reaction can last from 30 minutes to several hours. What side effects may I notice from receiving this medicine? Side effects that you should report to your doctor or health care professional as soon as possible:  allergic reactions like skin rash, itching or hives, swelling of the face, lips, or tongue  breathing problems  changes in vision  fast, irregular heartbeat  high or low blood pressure  mouth sores  pain, tingling, numbness in the hands or feet  signs of decreased platelets or bleeding - bruising, pinpoint red spots on the skin, black, tarry stools, blood in the urine  signs of decreased red blood cells - unusually weak or tired, feeling faint or lightheaded, falls  signs of infection - fever or chills, cough, sore throat, pain or difficulty passing urine  signs and symptoms of liver injury like dark yellow or brown urine; general ill feeling or flu-like symptoms; light-colored stools; loss of appetite; nausea; right upper belly pain; unusually weak or tired; yellowing of the eyes or skin  swelling of the ankles, feet, hands  unusually slow heartbeat Side effects that usually do not require medical attention (report to your doctor or health care professional if they continue or are bothersome):  diarrhea  hair loss  loss of appetite  muscle or joint pain  nausea, vomiting  pain, redness, or irritation at site where injected  tiredness This list may not describe all possible side effects. Call your doctor for medical advice about side effects. You may report side effects to FDA at 1-800-FDA-1088. Where should I keep my medicine? This drug is given in a hospital or clinic and will not be stored at home. NOTE: This sheet is a summary. It may not cover all possible information.  If you have questions about this medicine, talk to your doctor, pharmacist, or health care provider.  2020 Elsevier/Gold Standard (2017-08-10 13:14:55)  Carboplatin injection What is this medicine? CARBOPLATIN (KAR boe pla tin) is a chemotherapy drug. It targets fast dividing cells, like cancer cells, and causes these cells to die. This medicine is used to treat ovarian cancer and many other cancers. This medicine may be used for other purposes; ask your health care provider or pharmacist if you have questions. COMMON BRAND NAME(S): Paraplatin What should I tell my health care provider before I take this medicine? They need to know if you have any of these conditions:  blood disorders  hearing problems  kidney disease  recent or ongoing radiation therapy  an unusual or allergic reaction to carboplatin, cisplatin, other chemotherapy, other medicines, foods, dyes, or preservatives  pregnant or trying to get pregnant  breast-feeding How should I use this medicine? This drug is usually given as an infusion into a vein. It is administered in a hospital or clinic by a specially trained health care professional. Talk to your pediatrician regarding the use of this medicine in children. Special care may be needed. Overdosage: If you think you have taken too much of this medicine contact a poison control center or emergency room at once. NOTE: This medicine is only for you. Do not share this medicine with others. What if I miss a dose? It is important not to miss a dose. Call your doctor or health care professional if you are unable to keep an appointment. What may interact with this medicine?  medicines for seizures  medicines to increase blood counts like filgrastim, pegfilgrastim, sargramostim  some antibiotics like amikacin, gentamicin, neomycin, streptomycin, tobramycin  vaccines Talk to your doctor or health care professional before taking any of these  medicines:  acetaminophen  aspirin  ibuprofen  ketoprofen  naproxen This list may not describe all possible interactions. Give your health care provider a list of all the medicines, herbs, non-prescription drugs, or dietary supplements you use. Also tell them if you smoke, drink alcohol, or use illegal drugs. Some items may interact with your medicine. What should I watch for while using this medicine? Your condition will be monitored carefully while you are receiving this medicine. You will need important blood work done while you are taking this medicine. This drug may make you feel generally unwell. This is not uncommon, as chemotherapy can affect healthy cells as well as cancer cells. Report any side effects. Continue your course of treatment even though you feel ill unless your doctor tells you to stop. In some cases, you may be given additional medicines to help with side effects. Follow all directions for their use. Call your doctor or health care professional for advice if you get a fever, chills or sore throat, or other symptoms of a cold or flu. Do not treat yourself. This drug decreases your body's ability to fight infections. Try to avoid being around people who are sick. This medicine may increase your risk to bruise or bleed.  Call your doctor or health care professional if you notice any unusual bleeding. Be careful brushing and flossing your teeth or using a toothpick because you may get an infection or bleed more easily. If you have any dental work done, tell your dentist you are receiving this medicine. Avoid taking products that contain aspirin, acetaminophen, ibuprofen, naproxen, or ketoprofen unless instructed by your doctor. These medicines may hide a fever. Do not become pregnant while taking this medicine. Women should inform their doctor if they wish to become pregnant or think they might be pregnant. There is a potential for serious side effects to an unborn child. Talk  to your health care professional or pharmacist for more information. Do not breast-feed an infant while taking this medicine. What side effects may I notice from receiving this medicine? Side effects that you should report to your doctor or health care professional as soon as possible:  allergic reactions like skin rash, itching or hives, swelling of the face, lips, or tongue  signs of infection - fever or chills, cough, sore throat, pain or difficulty passing urine  signs of decreased platelets or bleeding - bruising, pinpoint red spots on the skin, black, tarry stools, nosebleeds  signs of decreased red blood cells - unusually weak or tired, fainting spells, lightheadedness  breathing problems  changes in hearing  changes in vision  chest pain  high blood pressure  low blood counts - This drug may decrease the number of white blood cells, red blood cells and platelets. You may be at increased risk for infections and bleeding.  nausea and vomiting  pain, swelling, redness or irritation at the injection site  pain, tingling, numbness in the hands or feet  problems with balance, talking, walking  trouble passing urine or change in the amount of urine Side effects that usually do not require medical attention (report to your doctor or health care professional if they continue or are bothersome):  hair loss  loss of appetite  metallic taste in the mouth or changes in taste This list may not describe all possible side effects. Call your doctor for medical advice about side effects. You may report side effects to FDA at 1-800-FDA-1088. Where should I keep my medicine? This drug is given in a hospital or clinic and will not be stored at home. NOTE: This sheet is a summary. It may not cover all possible information. If you have questions about this medicine, talk to your doctor, pharmacist, or health care provider.  2020 Elsevier/Gold Standard (2008-03-13 14:38:05)

## 2020-03-01 ENCOUNTER — Other Ambulatory Visit: Payer: Self-pay

## 2020-03-01 ENCOUNTER — Telehealth: Payer: Self-pay | Admitting: *Deleted

## 2020-03-01 ENCOUNTER — Inpatient Hospital Stay: Payer: Medicare Other

## 2020-03-01 VITALS — BP 118/62 | HR 56 | Temp 98.2°F | Resp 18

## 2020-03-01 DIAGNOSIS — E785 Hyperlipidemia, unspecified: Secondary | ICD-10-CM | POA: Diagnosis not present

## 2020-03-01 DIAGNOSIS — K5900 Constipation, unspecified: Secondary | ICD-10-CM | POA: Diagnosis not present

## 2020-03-01 DIAGNOSIS — C3491 Malignant neoplasm of unspecified part of right bronchus or lung: Secondary | ICD-10-CM

## 2020-03-01 DIAGNOSIS — Z5189 Encounter for other specified aftercare: Secondary | ICD-10-CM | POA: Diagnosis not present

## 2020-03-01 DIAGNOSIS — Z5111 Encounter for antineoplastic chemotherapy: Secondary | ICD-10-CM | POA: Diagnosis not present

## 2020-03-01 DIAGNOSIS — I10 Essential (primary) hypertension: Secondary | ICD-10-CM | POA: Diagnosis not present

## 2020-03-01 DIAGNOSIS — C3411 Malignant neoplasm of upper lobe, right bronchus or lung: Secondary | ICD-10-CM | POA: Diagnosis not present

## 2020-03-01 MED ORDER — PEGFILGRASTIM-JMDB 6 MG/0.6ML ~~LOC~~ SOSY
PREFILLED_SYRINGE | SUBCUTANEOUS | Status: AC
  Filled 2020-03-01: qty 0.6

## 2020-03-01 MED ORDER — PEGFILGRASTIM-JMDB 6 MG/0.6ML ~~LOC~~ SOSY
6.0000 mg | PREFILLED_SYRINGE | Freq: Once | SUBCUTANEOUS | Status: AC
  Administered 2020-03-01: 6 mg via SUBCUTANEOUS

## 2020-03-01 NOTE — Patient Instructions (Signed)

## 2020-03-06 ENCOUNTER — Inpatient Hospital Stay: Payer: Medicare Other

## 2020-03-06 ENCOUNTER — Inpatient Hospital Stay (HOSPITAL_BASED_OUTPATIENT_CLINIC_OR_DEPARTMENT_OTHER): Payer: Medicare Other | Admitting: Internal Medicine

## 2020-03-06 ENCOUNTER — Other Ambulatory Visit: Payer: Self-pay

## 2020-03-06 ENCOUNTER — Telehealth: Payer: Self-pay | Admitting: Internal Medicine

## 2020-03-06 ENCOUNTER — Encounter: Payer: Self-pay | Admitting: Internal Medicine

## 2020-03-06 ENCOUNTER — Encounter: Payer: Self-pay | Admitting: *Deleted

## 2020-03-06 VITALS — BP 135/63 | HR 68 | Temp 97.8°F | Resp 18 | Ht 68.5 in | Wt 206.0 lb

## 2020-03-06 DIAGNOSIS — I10 Essential (primary) hypertension: Secondary | ICD-10-CM

## 2020-03-06 DIAGNOSIS — C3490 Malignant neoplasm of unspecified part of unspecified bronchus or lung: Secondary | ICD-10-CM

## 2020-03-06 DIAGNOSIS — Z5111 Encounter for antineoplastic chemotherapy: Secondary | ICD-10-CM | POA: Diagnosis not present

## 2020-03-06 DIAGNOSIS — K5900 Constipation, unspecified: Secondary | ICD-10-CM | POA: Diagnosis not present

## 2020-03-06 DIAGNOSIS — E785 Hyperlipidemia, unspecified: Secondary | ICD-10-CM | POA: Diagnosis not present

## 2020-03-06 DIAGNOSIS — C3411 Malignant neoplasm of upper lobe, right bronchus or lung: Secondary | ICD-10-CM | POA: Diagnosis not present

## 2020-03-06 DIAGNOSIS — Z5189 Encounter for other specified aftercare: Secondary | ICD-10-CM | POA: Diagnosis not present

## 2020-03-06 LAB — CBC WITH DIFFERENTIAL (CANCER CENTER ONLY)
Abs Immature Granulocytes: 0.16 10*3/uL — ABNORMAL HIGH (ref 0.00–0.07)
Basophils Absolute: 0.1 10*3/uL (ref 0.0–0.1)
Basophils Relative: 1 %
Eosinophils Absolute: 0.2 10*3/uL (ref 0.0–0.5)
Eosinophils Relative: 2 %
HCT: 35 % — ABNORMAL LOW (ref 39.0–52.0)
Hemoglobin: 11.2 g/dL — ABNORMAL LOW (ref 13.0–17.0)
Immature Granulocytes: 1 %
Lymphocytes Relative: 14 %
Lymphs Abs: 1.6 10*3/uL (ref 0.7–4.0)
MCH: 26.1 pg (ref 26.0–34.0)
MCHC: 32 g/dL (ref 30.0–36.0)
MCV: 81.6 fL (ref 80.0–100.0)
Monocytes Absolute: 1.8 10*3/uL — ABNORMAL HIGH (ref 0.1–1.0)
Monocytes Relative: 15 %
Neutro Abs: 7.7 10*3/uL (ref 1.7–7.7)
Neutrophils Relative %: 67 %
Platelet Count: 208 10*3/uL (ref 150–400)
RBC: 4.29 MIL/uL (ref 4.22–5.81)
RDW: 15.6 % — ABNORMAL HIGH (ref 11.5–15.5)
WBC Count: 11.5 10*3/uL — ABNORMAL HIGH (ref 4.0–10.5)
nRBC: 0 % (ref 0.0–0.2)

## 2020-03-06 LAB — CMP (CANCER CENTER ONLY)
ALT: 90 U/L — ABNORMAL HIGH (ref 0–44)
AST: 34 U/L (ref 15–41)
Albumin: 3.7 g/dL (ref 3.5–5.0)
Alkaline Phosphatase: 98 U/L (ref 38–126)
Anion gap: 8 (ref 5–15)
BUN: 27 mg/dL — ABNORMAL HIGH (ref 8–23)
CO2: 23 mmol/L (ref 22–32)
Calcium: 8.8 mg/dL — ABNORMAL LOW (ref 8.9–10.3)
Chloride: 107 mmol/L (ref 98–111)
Creatinine: 0.9 mg/dL (ref 0.61–1.24)
GFR, Est AFR Am: >60 mL/min (ref >60)
GFR, Estimated: >60 mL/min (ref >60)
Glucose, Bld: 109 mg/dL — ABNORMAL HIGH (ref 70–99)
Potassium: 4.5 mmol/L (ref 3.5–5.1)
Sodium: 138 mmol/L (ref 135–145)
Total Bilirubin: 0.3 mg/dL (ref 0.3–1.2)
Total Protein: 6.3 g/dL — ABNORMAL LOW (ref 6.5–8.1)

## 2020-03-06 NOTE — Progress Notes (Signed)
Oncology Nurse Navigator Documentation  Oncology Nurse Navigator Flowsheets 03/06/2020  Navigator Location [facility]  Referral Date to RadOnc/MedOnc -  Navigator Encounter Type Clinic/MDC/I spoke with Daniel Colon today in clinic. He is doing well.  No barriers identified at this time.   Telephone -  Patient Visit Type MedOnc  Treatment Phase Treatment  Barriers/Navigation Needs No Barriers At This Time  Education -  Interventions Psycho-Social Support  Acuity Level 1-No Barriers  Coordination of Care -  Education Method -  Time Spent with Patient 15

## 2020-03-06 NOTE — Telephone Encounter (Signed)
Scheduled per los. Patient declined printout  

## 2020-03-06 NOTE — Progress Notes (Signed)
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 Servando Snare  CURRENT THERAPY: Adjuvant systemic chemotherapy with carboplatin for AUC of 6 and paclitaxel 200 mg/M2 every 3 weeks with Neulasta support.  First dose February 28, 2020.  INTERVAL HISTORY: Daniel Colon 74 y.o. male returns to the clinic today for follow-up visit.  The patient is feeling fine today with no concerning complaints except for alternating diarrhea and constipation after his first dose of the chemotherapy.  The patient denied having any current fever or chills.  He has no nausea, vomiting, diarrhea or constipation.  He denied having any headache or visual changes.  He has no weight loss or night sweats.  He tolerated the first dose of his chemotherapy well except for fatigue after the Neulasta injection. He is here today for evaluation and management of any adverse effect of his treatment.  MEDICAL HISTORY: Past Medical History:  Diagnosis Date  . Allergy   . Arthritis   . Barrett's esophagus   . CAD (coronary artery disease)    a. Stent to the [facility] (in [place]);  b. ETT(11/13/13): abnormal with early ST changes to suggest ischemia;   c. LHC (11/17/13):  pLAD 30-40, D1 30, mLAD 70-75, mCFX 70, mRCA stent 40 (ISR), EF 55-65%.  Medical Rx  . Cataract    left eye removed, right immature  . Colon polyps   . Dyslipidemia   . GERD (gastroesophageal reflux disease)   . Hypertension   . Myocardial infarction ([place]) 1996  . Nephrolithiasis    kidney stone  . PUD (peptic ulcer disease)     ALLERGIES:  has No Known Allergies.  MEDICATIONS:  Current Outpatient Medications  Medication Sig  Dispense Refill  . acetaminophen (TYLENOL) 500 MG tablet Take 500-1,000 mg by mouth every 6 (six) hours as needed (for pain.).    Marland Kitchen aspirin EC 81 MG tablet Take 81 mg by mouth 3 (three) times a week. In the morning    . atorvastatin (LIPITOR) 40 MG tablet TAKE 1 TABLET DAILY (Patient taking differently: Take 40 mg by mouth daily. ) 90 tablet 3  . carvedilol (COREG) 12.5 MG tablet Take 12.5 mg by mouth daily.     . Coenzyme Q10 (CO Q-10) 100 MG CAPS Take 100 mg by mouth daily.     Marland Kitchen ezetimibe (ZETIA) 10 MG tablet Take 10 mg by mouth daily.    . fish oil-omega-3 fatty acids 1000 MG capsule Take 1,000 mg by mouth daily.     Marland Kitchen guaiFENesin (MUCINEX) 600 MG 12 hr tablet Take 1 tablet (600 mg total) by mouth 2 (two) times daily as needed for cough or to loosen phlegm.    Marland Kitchen ipratropium (ATROVENT) 0.03 % nasal spray Place 1 spray into both nostrils daily.     . Multiple Vitamin (MULTIVITAMIN WITH MINERALS) TABS tablet Take 1 tablet by mouth daily.    . nitroGLYCERIN (NITROLINGUAL) 0.4 MG/SPRAY spray USE 1 SPRAY ON OR UNDER THE TONGUE EVERY 5 MINUTES FOR 3 DOSES AS NEEDED FOR CHEST PAIN AS DIRECTED BY PRESCRIBER OR PACKAGE INSTRUCTIONS 24 g 3  . omeprazole (PRILOSEC) 10 MG capsule Take 10 mg by mouth daily.    Marland Kitchen  oxybutynin (DITROPAN) 5 MG tablet Take 5 mg by mouth daily.     . prochlorperazine (COMPAZINE) 10 MG tablet Take 1 tablet (10 mg total) by mouth every 6 (six) hours as needed for nausea or vomiting. 30 tablet 0  . telmisartan-hydrochlorothiazide (MICARDIS HCT) 80-12.5 MG tablet Take 1 tablet by mouth daily. 90 tablet 3  . traMADol (ULTRAM) 50 MG tablet Take 1 tablet (50 mg total) by mouth every 6 (six) hours as needed (mild pain). 20 tablet 0   No current facility-administered medications for this visit.    SURGICAL HISTORY:  Past Surgical History:  Procedure Laterality Date  . ANGIOPLASTY     stent - rt cor. art  . BILROTH II PROCEDURE    . COLONOSCOPY    . INTERCOSTAL NERVE BLOCK Right  01/24/2020   Procedure: Intercostal Nerve Block;  Surgeon: Grace Isaac, MD;  Location: [facility];  Service: Thoracic;  Laterality: Right;  . LEFT HEART CATHETERIZATION WITH CORONARY ANGIOGRAM N/A 11/17/2013   Procedure: LEFT HEART CATHETERIZATION WITH CORONARY ANGIOGRAM;  Surgeon: Blane Ohara, MD;  Location: [facility] CATH LAB;  Service: Cardiovascular;  Laterality: N/A;  . LYMPH NODE DISSECTION N/A 01/24/2020   Procedure: LYMPH NODE DISSECTION;  Surgeon: Grace Isaac, MD;  Location: [facility];  Service: Thoracic;  Laterality: N/A;  . POLYPECTOMY    . ulcer surgery    . ULNAR NERVE TRANSPOSITION Right 08/05/2017   Procedure: RIGHT ULNAR NERVE DECOMPRESSION;  Surgeon: Leanora Cover, MD;  Location: [facility];  Service: Orthopedics;  Laterality: Right;  Marland Kitchen VIDEO BRONCHOSCOPY N/A 01/24/2020   Procedure: VIDEO BRONCHOSCOPY;  Surgeon: Grace Isaac, MD;  Location: [facility] OR;  Service: Thoracic;  Laterality: N/A;    REVIEW OF SYSTEMS:  A comprehensive review of systems was negative except for: Constitutional: positive for fatigue Gastrointestinal: positive for constipation and diarrhea   PHYSICAL EXAMINATION: General appearance: alert, cooperative, fatigued and no distress Head: Normocephalic, without obvious abnormality, atraumatic Neck: no adenopathy, no JVD, supple, symmetrical, trachea midline and thyroid not enlarged, symmetric, no tenderness/mass/nodules Lymph nodes: Cervical, supraclavicular, and axillary nodes normal. Resp: clear to auscultation bilaterally Back: symmetric, no curvature. ROM normal. No CVA tenderness. Cardio: regular rate and rhythm, S1, S2 normal, no murmur, click, rub or gallop GI: soft, non-tender; bowel sounds normal; no masses,  no organomegaly Extremities: extremities normal, atraumatic, no cyanosis or edema  ECOG PERFORMANCE STATUS: 1 - Symptomatic but completely ambulatory  Blood pressure 135/63, pulse 68, temperature 97.8 F (36.6 C), temperature  source Temporal, resp. rate 18, height 5' 8.5" (1.74 m), weight 206 lb (93.4 kg), SpO2 98 %.  LABORATORY DATA: Lab Results  Component Value Date   WBC 7.9 02/28/2020   HGB 11.2 (L) 02/28/2020   HCT 34.9 (L) 02/28/2020   MCV 81.2 02/28/2020   PLT 210 02/28/2020      Chemistry      Component Value Date/Time   NA 139 02/28/2020 1023   K 4.4 02/28/2020 1023   CL 107 02/28/2020 1023   CO2 24 02/28/2020 1023   BUN 23 02/28/2020 1023   CREATININE 0.89 02/28/2020 1023      Component Value Date/Time   CALCIUM 8.4 (L) 02/28/2020 1023   ALKPHOS 65 02/28/2020 1023   AST 13 (L) 02/28/2020 1023   ALT 12 02/28/2020 1023   BILITOT 0.4 02/28/2020 1023       RADIOGRAPHIC STUDIES: DG Chest 2 View  Result Date: 02/22/2020 CLINICAL DATA:  Lung cancer, status post  bronchoscopy EXAM: CHEST - 2 VIEW COMPARISON:  02/12/2020, 02/01/2020 FINDINGS: Stable right apical loculated hydropneumothorax. Left lung is expanded. Mild right basilar atelectasis. Stable cardiomediastinal silhouette. No aggressive osseous lesion. IMPRESSION: Stable right apical loculated hydropneumothorax. Electronically Signed   By: Kathreen Devoid   On: 02/22/2020 12:20   DG Chest 2 View  Result Date: 02/12/2020 CLINICAL DATA:  Lung cancer, status post bronchoscopy and lymph node dissection 01/24/2020. Shortness of breath. EXAM: CHEST - 2 VIEW COMPARISON:  02/01/2020 FINDINGS: Stable right apical hydropneumothorax. Left lung is expanded. Mild right basilar atelectasis. Stable cardiomediastinal silhouette. No aggressive osseous lesion. IMPRESSION: Stable right apical hydropneumothorax. Electronically Signed   By: Kathreen Devoid   On: 02/12/2020 12:43   CT Head W Wo Contrast  Result Date: 02/23/2020 CLINICAL DATA:  Non-small cell lung cancer. Staging. EXAM: CT HEAD WITHOUT AND WITH CONTRAST TECHNIQUE: Contiguous axial images were obtained from the base of the skull through the vertex without and with intravenous contrast CONTRAST:  36mL  OMNIPAQUE IOHEXOL 300 MG/ML  SOLN COMPARISON:  None. FINDINGS: Brain: No evidence of acute infarction, hemorrhage, hydrocephalus, extra-axial collection or mass lesion/mass effect. No focus of abnormal contrast enhancement seen to suggest metastatic disease to the brain. Vascular: Calcified plaques are seen in the bilateral carotid siphons. Skull: Normal. Negative for fracture or focal lesion. Sinuses/Orbits: Mild mucosal thickening is seen throughout the visualized paranasal sinuses. Left lens surgery is noted. Other: None. IMPRESSION: 1. No acute intracranial abnormality. No evidence of metastatic disease to the brain. 2. Mild mucosal thickening throughout the visualized paranasal sinuses. Electronically Signed   By: Pedro Earls M.D.   On: 02/23/2020 09:28    ASSESSMENT AND PLAN: This is a very pleasant 74 years old white male recently diagnosed with a stage IIb non-small cell lung cancer with sarcomatoid features in February 2021 status post right upper lobectomy with lymph node dissection under the care of Dr. Servando Snare. The patient is currently undergoing adjuvant systemic chemotherapy with carboplatin for AUC of 6 and paclitaxel 200 mg/M2 every 3 weeks with Neulasta support.  Status post 1 cycle.  He started the first dose of his treatment last week. He tolerated the first week of his treatment well with no concerning complaints except for fatigue as well as alternating episodes of diarrhea and constipation.  He history of surgery for peptic ulcer disease. I recommended for the patient to continue with weekly lab for now. I will see him back for follow-up visit in 2 weeks for evaluation before starting cycle #2. The patient was also advised to reach out to his gastroenterologist for evaluation of the alternating diarrhea and constipation and see if he will need any additional treatment for this condition or pancreatic enzymes. He was advised to call immediately if he has any  concerning symptoms in the interval. The patient voices understanding of current disease status and treatment options and is in agreement with the current care plan.  All questions were answered. The patient knows to call the clinic with any problems, questions or concerns. We can certainly see the patient much sooner if necessary.  Disclaimer: This note was dictated with voice recognition software. Similar sounding words can inadvertently be transcribed and may not be corrected upon review.

## 2020-03-07 NOTE — Addendum Note (Signed)
Addended by: Ardeen Garland on: 03/07/2020 11:08 AM   Modules accepted: Orders

## 2020-03-13 ENCOUNTER — Inpatient Hospital Stay: Payer: Medicare Other

## 2020-03-13 ENCOUNTER — Other Ambulatory Visit: Payer: Self-pay

## 2020-03-13 DIAGNOSIS — C3411 Malignant neoplasm of upper lobe, right bronchus or lung: Secondary | ICD-10-CM | POA: Diagnosis not present

## 2020-03-13 DIAGNOSIS — Z5189 Encounter for other specified aftercare: Secondary | ICD-10-CM | POA: Diagnosis not present

## 2020-03-13 DIAGNOSIS — K5900 Constipation, unspecified: Secondary | ICD-10-CM | POA: Diagnosis not present

## 2020-03-13 DIAGNOSIS — Z5111 Encounter for antineoplastic chemotherapy: Secondary | ICD-10-CM | POA: Diagnosis not present

## 2020-03-13 DIAGNOSIS — I10 Essential (primary) hypertension: Secondary | ICD-10-CM | POA: Diagnosis not present

## 2020-03-13 DIAGNOSIS — E785 Hyperlipidemia, unspecified: Secondary | ICD-10-CM | POA: Diagnosis not present

## 2020-03-13 DIAGNOSIS — C3490 Malignant neoplasm of unspecified part of unspecified bronchus or lung: Secondary | ICD-10-CM

## 2020-03-13 LAB — CBC WITH DIFFERENTIAL (CANCER CENTER ONLY)
Abs Immature Granulocytes: 0.33 10*3/uL — ABNORMAL HIGH (ref 0.00–0.07)
Basophils Absolute: 0.1 10*3/uL (ref 0.0–0.1)
Basophils Relative: 0 %
Eosinophils Absolute: 0.1 10*3/uL (ref 0.0–0.5)
Eosinophils Relative: 1 %
HCT: 34.7 % — ABNORMAL LOW (ref 39.0–52.0)
Hemoglobin: 11.1 g/dL — ABNORMAL LOW (ref 13.0–17.0)
Immature Granulocytes: 3 %
Lymphocytes Relative: 11 %
Lymphs Abs: 1.3 10*3/uL (ref 0.7–4.0)
MCH: 26.2 pg (ref 26.0–34.0)
MCHC: 32 g/dL (ref 30.0–36.0)
MCV: 82 fL (ref 80.0–100.0)
Monocytes Absolute: 0.7 10*3/uL (ref 0.1–1.0)
Monocytes Relative: 6 %
Neutro Abs: 9.7 10*3/uL — ABNORMAL HIGH (ref 1.7–7.7)
Neutrophils Relative %: 79 %
Platelet Count: 116 10*3/uL — ABNORMAL LOW (ref 150–400)
RBC: 4.23 MIL/uL (ref 4.22–5.81)
RDW: 16.5 % — ABNORMAL HIGH (ref 11.5–15.5)
WBC Count: 12.2 10*3/uL — ABNORMAL HIGH (ref 4.0–10.5)
nRBC: 0 % (ref 0.0–0.2)

## 2020-03-13 LAB — CMP (CANCER CENTER ONLY)
ALT: 34 U/L (ref 0–44)
AST: 18 U/L (ref 15–41)
Albumin: 3.4 g/dL — ABNORMAL LOW (ref 3.5–5.0)
Alkaline Phosphatase: 98 U/L (ref 38–126)
Anion gap: 11 (ref 5–15)
BUN: 18 mg/dL (ref 8–23)
CO2: 25 mmol/L (ref 22–32)
Calcium: 8.6 mg/dL — ABNORMAL LOW (ref 8.9–10.3)
Chloride: 104 mmol/L (ref 98–111)
Creatinine: 0.79 mg/dL (ref 0.61–1.24)
GFR, Est AFR Am: >60 mL/min (ref >60)
GFR, Estimated: >60 mL/min (ref >60)
Glucose, Bld: 99 mg/dL (ref 70–99)
Potassium: 4.4 mmol/L (ref 3.5–5.1)
Sodium: 140 mmol/L (ref 135–145)
Total Bilirubin: 0.5 mg/dL (ref 0.3–1.2)
Total Protein: 5.9 g/dL — ABNORMAL LOW (ref 6.5–8.1)

## 2020-03-18 NOTE — Progress Notes (Signed)
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 Servando Snare  CURRENT THERAPY: Adjuvant systemic chemotherapy with carboplatin for AUC of 6 and paclitaxel 200 mg/M2 every 3 weeks with Neulasta support.  First dose February 28, 2020. Status post 1 cycle.   INTERVAL HISTORY: Daniel Colon 74 y.o. male returns to the clinic for a follow up visit accompanied by his wife. The patient is feeling well today without any concerning complaints except he endorses stable but persistent abdominal bloating and discomfort for the last month or so since his surgery. He sees Dr. Servando Snare in two days for a follow up visit. He denies any fevers, chills, night sweats, nausea, vomiting, or discolored stool. He endorses his baseline bowel habits. He frequently has constipation for which he takes a laxative. He localizes his discomfort to the lateral side of the right abdomen and states it feels harder. He can feel his discomfort with deep breathing as well. Regarding his treatment, the patiented tolerated his first treatment with chemotherapy well without any adverse effects except fatigue and generalized weakness which may also have been due to his neulasta injection. Denies any weight loss. Denies any cough or hemoptysis but endorses his stable dyspnea on exertion. Denies any headache or visual changes. The patient is here today for evaluation prior to starting cycle # 2   MEDICAL HISTORY: Past Medical History:  Diagnosis Date  . Allergy   . Arthritis   . Barrett's esophagus   . CAD (coronary artery disease)    a. Stent to the [place] (in [place]);  b. ETT(11/13/13): abnormal with early ST changes to suggest ischemia;   c. LHC (11/17/13):  pLAD  30-40, D1 30, mLAD 70-75, mCFX 70, mRCA stent 40 (ISR), EF 55-65%.  Medical Rx  . Cataract    left eye removed, right immature  . Colon polyps   . Dyslipidemia   . GERD (gastroesophageal reflux disease)   . Hypertension   . Myocardial infarction ([place]) 1996  . Nephrolithiasis    kidney stone  . PUD (peptic ulcer disease)     ALLERGIES:  has No Known Allergies.  MEDICATIONS:  Current Outpatient Medications  Medication Sig Dispense Refill  . acetaminophen (TYLENOL) 500 MG tablet Take 500-1,000 mg by mouth every 6 (six) hours as needed (for pain.).    Marland Kitchen aspirin EC 81 MG tablet Take 81 mg by mouth 3 (three) times a week. In the morning    . atorvastatin (LIPITOR) 40 MG tablet TAKE 1 TABLET DAILY (Patient taking differently: Take 40 mg by mouth daily. ) 90 tablet 3  . carvedilol (COREG) 12.5 MG tablet Take 12.5 mg by mouth daily.     . Coenzyme Q10 (CO Q-10) 100 MG CAPS Take 100 mg by mouth daily.     Marland Kitchen docusate sodium (COLACE) 100 MG capsule Take 100 mg by mouth daily as needed for mild constipation.    Marland Kitchen ezetimibe (ZETIA) 10 MG tablet Take 10 mg by mouth daily.    . fish oil-omega-3 fatty acids 1000 MG capsule Take 1,000 mg by mouth daily.     Marland Kitchen guaiFENesin (MUCINEX) 600 MG 12 hr tablet Take 1 tablet (600 mg total) by mouth 2 (two) times daily as needed for cough or to  loosen phlegm.    Marland Kitchen ipratropium (ATROVENT) 0.03 % nasal spray Place 1 spray into both nostrils daily.     . Multiple Vitamin (MULTIVITAMIN WITH MINERALS) TABS tablet Take 1 tablet by mouth daily.    . nitroGLYCERIN (NITROLINGUAL) 0.4 MG/SPRAY spray USE 1 SPRAY ON OR UNDER THE TONGUE EVERY 5 MINUTES FOR 3 DOSES AS NEEDED FOR CHEST PAIN AS DIRECTED BY PRESCRIBER OR PACKAGE INSTRUCTIONS (Patient not taking: Reported on 03/06/2020) 24 g 3  . omeprazole (PRILOSEC) 10 MG capsule Take 10 mg by mouth daily.    Marland Kitchen oxybutynin (DITROPAN) 5 MG tablet Take 5 mg by mouth daily.     . prochlorperazine (COMPAZINE) 10 MG tablet Take 1 tablet  (10 mg total) by mouth every 6 (six) hours as needed for nausea or vomiting. (Patient not taking: Reported on 03/06/2020) 30 tablet 0  . senna (SENOKOT) 176 MG/5ML SYRP Take by mouth.    . Sennosides (SENNA LAXATIVE) 25 MG TABS Take 1 tablet by mouth daily.    Marland Kitchen telmisartan-hydrochlorothiazide (MICARDIS HCT) 80-12.5 MG tablet Take 1 tablet by mouth daily. 90 tablet 3  . traMADol (ULTRAM) 50 MG tablet Take 1 tablet (50 mg total) by mouth every 6 (six) hours as needed (mild pain). (Patient not taking: Reported on 03/06/2020) 20 tablet 0   No current facility-administered medications for this visit.    SURGICAL HISTORY:  Past Surgical History:  Procedure Laterality Date  . ANGIOPLASTY     stent - rt cor. art  . BILROTH II PROCEDURE    . COLONOSCOPY    . INTERCOSTAL NERVE BLOCK Right 01/24/2020   Procedure: Intercostal Nerve Block;  Surgeon: Grace Isaac, MD;  Location: [facility];  Service: Thoracic;  Laterality: Right;  . LEFT HEART CATHETERIZATION WITH CORONARY ANGIOGRAM N/A 11/17/2013   Procedure: LEFT HEART CATHETERIZATION WITH CORONARY ANGIOGRAM;  Surgeon: Blane Ohara, MD;  Location: [facility] CATH LAB;  Service: Cardiovascular;  Laterality: N/A;  . LYMPH NODE DISSECTION N/A 01/24/2020   Procedure: LYMPH NODE DISSECTION;  Surgeon: Grace Isaac, MD;  Location: [facility];  Service: Thoracic;  Laterality: N/A;  . POLYPECTOMY    . ulcer surgery    . ULNAR NERVE TRANSPOSITION Right 08/05/2017   Procedure: RIGHT ULNAR NERVE DECOMPRESSION;  Surgeon: Leanora Cover, MD;  Location: [place];  Service: Orthopedics;  Laterality: Right;  Marland Kitchen VIDEO BRONCHOSCOPY N/A 01/24/2020   Procedure: VIDEO BRONCHOSCOPY;  Surgeon: Grace Isaac, MD;  Location: [facility] OR;  Service: Thoracic;  Laterality: N/A;    REVIEW OF SYSTEMS:   Review of Systems  Constitutional: Positive for fatigue and generalized weakness. Negative for appetite change, chills, fever and unexpected weight change.  HENT: Negative  for mouth sores, nosebleeds, sore throat and trouble swallowing.   Eyes: Negative for eye problems and icterus.  Respiratory: Positive for baseline dyspnea on exertion. Negative for cough, hemoptysis, and wheezing.   Cardiovascular: Negative for chest pain and leg swelling.  Gastrointestinal: Positive for right lateral abdominal swelling and bloating. Negative for constipation, diarrhea, nausea and vomiting.  Genitourinary: Negative for bladder incontinence, difficulty urinating, dysuria, frequency and hematuria.   Musculoskeletal: Negative for back pain, gait problem, neck pain and neck stiffness.  Skin: Negative for itching and rash.  Neurological: Negative for dizziness, extremity weakness, gait problem, headaches, light-headedness and seizures.  Hematological: Negative for adenopathy. Does not bruise/bleed easily.  Psychiatric/Behavioral: Negative for confusion, depression and sleep disturbance. The patient is not nervous/anxious.     PHYSICAL EXAMINATION:  There were no vitals taken for this visit.  ECOG PERFORMANCE STATUS: 1 - Symptomatic but completely ambulatory  Physical Exam  Constitutional: Oriented to person, place, and time and well-developed, well-nourished, and in no distress.  HENT:  Head: Normocephalic and atraumatic.  Mouth/Throat: Oropharynx is clear and moist. No oropharyngeal exudate.  Eyes: Conjunctivae are normal. Right eye exhibits no discharge. Left eye exhibits no discharge. No scleral icterus.  Neck: Normal range of motion. Neck supple.  Cardiovascular: Normal rate, regular rhythm, normal heart sounds and intact distal pulses.   Pulmonary/Chest: Effort normal and breath sounds normal. No respiratory distress. No wheezes. No rales.  Abdominal: Soft. Bowel sounds are normal. Exhibits no distension and no mass. There is no tenderness. No rebound tenderness. No organomegaly. Negative murphy sign. Negative Rovsing sign.  3 well healing incisions on lateral side of the  right lateral abdomen/rib cage from RUL lobectomy. No erythema. Some firmness over incisions likely related to scarring. Endorses numbness over surgical site.  Musculoskeletal: Normal range of motion. Exhibits no edema.  Lymphadenopathy:    No cervical adenopathy.  Neurological: Alert and oriented to person, place, and time. Exhibits normal muscle tone. Gait normal. Coordination normal.  Skin: Skin is warm and dry. No rash noted. Not diaphoretic. No erythema. No pallor.  Psychiatric: Mood, memory and judgment normal.  Vitals reviewed.  LABORATORY DATA: Lab Results  Component Value Date   WBC 12.2 (H) 03/13/2020   HGB 11.1 (L) 03/13/2020   HCT 34.7 (L) 03/13/2020   MCV 82.0 03/13/2020   PLT 116 (L) 03/13/2020      Chemistry      Component Value Date/Time   NA 140 03/13/2020 0724   K 4.4 03/13/2020 0724   CL 104 03/13/2020 0724   CO2 25 03/13/2020 0724   BUN 18 03/13/2020 0724   CREATININE 0.79 03/13/2020 0724      Component Value Date/Time   CALCIUM 8.6 (L) 03/13/2020 0724   ALKPHOS 98 03/13/2020 0724   AST 18 03/13/2020 0724   ALT 34 03/13/2020 0724   BILITOT 0.5 03/13/2020 0724       RADIOGRAPHIC STUDIES:  DG Chest 2 View  Result Date: 02/22/2020 CLINICAL DATA:  Lung cancer, status post bronchoscopy EXAM: CHEST - 2 VIEW COMPARISON:  02/12/2020, 02/01/2020 FINDINGS: Stable right apical loculated hydropneumothorax. Left lung is expanded. Mild right basilar atelectasis. Stable cardiomediastinal silhouette. No aggressive osseous lesion. IMPRESSION: Stable right apical loculated hydropneumothorax. Electronically Signed   By: Kathreen Devoid   On: 02/22/2020 12:20   CT Head W Wo Contrast  Result Date: 02/23/2020 CLINICAL DATA:  Non-small cell lung cancer. Staging. EXAM: CT HEAD WITHOUT AND WITH CONTRAST TECHNIQUE: Contiguous axial images were obtained from the base of the skull through the vertex without and with intravenous contrast CONTRAST:  4mL OMNIPAQUE IOHEXOL 300 MG/ML   SOLN COMPARISON:  None. FINDINGS: Brain: No evidence of acute infarction, hemorrhage, hydrocephalus, extra-axial collection or mass lesion/mass effect. No focus of abnormal contrast enhancement seen to suggest metastatic disease to the brain. Vascular: Calcified plaques are seen in the bilateral carotid siphons. Skull: Normal. Negative for fracture or focal lesion. Sinuses/Orbits: Mild mucosal thickening is seen throughout the visualized paranasal sinuses. Left lens surgery is noted. Other: None. IMPRESSION: 1. No acute intracranial abnormality. No evidence of metastatic disease to the brain. 2. Mild mucosal thickening throughout the visualized paranasal sinuses. Electronically Signed   By: Pedro Earls M.D.   On: 02/23/2020 09:28     ASSESSMENT/PLAN:  This is a very pleasant 74 year old Caucasian male diagnosed with stage IIb carcinoma with sarcomatoid features in February 2021.  He had a right upper lobectomy with lymph node dissection under the care of Dr. Servando Snare.   The patient is currently undergoing adjuvant systemic chemotherapy with carboplatin for an AUC of 6 and paclitaxel 200 mg/m IV every 3 weeks with Neulasta support.  He is status post 1 cycle and he tolerated the first treatment fairly well except for some fatigue with his Neulasta injection.  He has a history of surgery for peptic ulcer disease.   Labs were reviewed.  We recommend that he proceed with cycle #2 today scheduled.  We will see the patient back for follow-up visit in 3 weeks for evaluation before starting cycle #3.  He will follow up with Dr. Servando Snare on 4/1 as planned. Advised him to discuss with his surgeon post-surgical time line for improvement in his symptoms.    The patient was advised to call immediately if he has any concerning symptoms in the interval. The patient voices understanding of current disease status and treatment options and is in agreement with the current care plan. All questions  were answered. The patient knows to call the clinic with any problems, questions or concerns. We can certainly see the patient much sooner if necessary     No orders of the defined types were placed in this encounter.    Chene Kasinger L Nancee Brownrigg, PA-C 03/18/20

## 2020-03-19 ENCOUNTER — Inpatient Hospital Stay (HOSPITAL_BASED_OUTPATIENT_CLINIC_OR_DEPARTMENT_OTHER): Payer: Medicare Other | Admitting: Physician Assistant

## 2020-03-19 ENCOUNTER — Other Ambulatory Visit: Payer: Self-pay

## 2020-03-19 ENCOUNTER — Inpatient Hospital Stay: Payer: Medicare Other

## 2020-03-19 ENCOUNTER — Encounter: Payer: Self-pay | Admitting: Physician Assistant

## 2020-03-19 ENCOUNTER — Encounter: Payer: Self-pay | Admitting: Cardiothoracic Surgery

## 2020-03-19 ENCOUNTER — Other Ambulatory Visit: Payer: Self-pay | Admitting: Internal Medicine

## 2020-03-19 VITALS — BP 106/46 | HR 68 | Temp 98.2°F | Resp 18 | Ht 68.5 in | Wt 202.9 lb

## 2020-03-19 DIAGNOSIS — E785 Hyperlipidemia, unspecified: Secondary | ICD-10-CM | POA: Diagnosis not present

## 2020-03-19 DIAGNOSIS — K5900 Constipation, unspecified: Secondary | ICD-10-CM | POA: Diagnosis not present

## 2020-03-19 DIAGNOSIS — I10 Essential (primary) hypertension: Secondary | ICD-10-CM | POA: Diagnosis not present

## 2020-03-19 DIAGNOSIS — Z5189 Encounter for other specified aftercare: Secondary | ICD-10-CM | POA: Diagnosis not present

## 2020-03-19 DIAGNOSIS — Z5111 Encounter for antineoplastic chemotherapy: Secondary | ICD-10-CM

## 2020-03-19 DIAGNOSIS — Z902 Acquired absence of lung [part of]: Secondary | ICD-10-CM

## 2020-03-19 DIAGNOSIS — C3490 Malignant neoplasm of unspecified part of unspecified bronchus or lung: Secondary | ICD-10-CM | POA: Diagnosis not present

## 2020-03-19 DIAGNOSIS — C3411 Malignant neoplasm of upper lobe, right bronchus or lung: Secondary | ICD-10-CM | POA: Diagnosis not present

## 2020-03-19 DIAGNOSIS — C3491 Malignant neoplasm of unspecified part of right bronchus or lung: Secondary | ICD-10-CM

## 2020-03-19 DIAGNOSIS — R109 Unspecified abdominal pain: Secondary | ICD-10-CM | POA: Insufficient documentation

## 2020-03-19 DIAGNOSIS — R1084 Generalized abdominal pain: Secondary | ICD-10-CM

## 2020-03-19 LAB — CMP (CANCER CENTER ONLY)
ALT: 19 U/L (ref 0–44)
AST: 14 U/L — ABNORMAL LOW (ref 15–41)
Albumin: 3.5 g/dL (ref 3.5–5.0)
Alkaline Phosphatase: 88 U/L (ref 38–126)
Anion gap: 11 (ref 5–15)
BUN: 24 mg/dL — ABNORMAL HIGH (ref 8–23)
CO2: 20 mmol/L — ABNORMAL LOW (ref 22–32)
Calcium: 8.9 mg/dL (ref 8.9–10.3)
Chloride: 107 mmol/L (ref 98–111)
Creatinine: 1.1 mg/dL (ref 0.61–1.24)
GFR, Est AFR Am: >60 mL/min (ref >60)
GFR, Estimated: >60 mL/min (ref >60)
Glucose, Bld: 89 mg/dL (ref 70–99)
Potassium: 4.3 mmol/L (ref 3.5–5.1)
Sodium: 138 mmol/L (ref 135–145)
Total Bilirubin: 0.5 mg/dL (ref 0.3–1.2)
Total Protein: 6.4 g/dL — ABNORMAL LOW (ref 6.5–8.1)

## 2020-03-19 LAB — CBC WITH DIFFERENTIAL (CANCER CENTER ONLY)
Abs Immature Granulocytes: 0.07 10*3/uL (ref 0.00–0.07)
Basophils Absolute: 0.1 10*3/uL (ref 0.0–0.1)
Basophils Relative: 1 %
Eosinophils Absolute: 0.1 10*3/uL (ref 0.0–0.5)
Eosinophils Relative: 1 %
HCT: 34.1 % — ABNORMAL LOW (ref 39.0–52.0)
Hemoglobin: 10.9 g/dL — ABNORMAL LOW (ref 13.0–17.0)
Immature Granulocytes: 1 %
Lymphocytes Relative: 9 %
Lymphs Abs: 1 10*3/uL (ref 0.7–4.0)
MCH: 26.3 pg (ref 26.0–34.0)
MCHC: 32 g/dL (ref 30.0–36.0)
MCV: 82.4 fL (ref 80.0–100.0)
Monocytes Absolute: 1 10*3/uL (ref 0.1–1.0)
Monocytes Relative: 9 %
Neutro Abs: 9 10*3/uL — ABNORMAL HIGH (ref 1.7–7.7)
Neutrophils Relative %: 79 %
Platelet Count: 248 10*3/uL (ref 150–400)
RBC: 4.14 MIL/uL — ABNORMAL LOW (ref 4.22–5.81)
RDW: 17.3 % — ABNORMAL HIGH (ref 11.5–15.5)
WBC Count: 11.3 10*3/uL — ABNORMAL HIGH (ref 4.0–10.5)
nRBC: 0 % (ref 0.0–0.2)

## 2020-03-19 MED ORDER — SODIUM CHLORIDE 0.9 % IV SOLN
200.0000 mg/m2 | Freq: Once | INTRAVENOUS | Status: AC
  Administered 2020-03-19: 13:00:00 426 mg via INTRAVENOUS
  Filled 2020-03-19: qty 71

## 2020-03-19 MED ORDER — PALONOSETRON HCL INJECTION 0.25 MG/5ML
0.2500 mg | Freq: Once | INTRAVENOUS | Status: AC
  Administered 2020-03-19: 12:00:00 0.25 mg via INTRAVENOUS

## 2020-03-19 MED ORDER — DEXAMETHASONE SODIUM PHOSPHATE 10 MG/ML IJ SOLN
INTRAMUSCULAR | Status: AC
  Filled 2020-03-19: qty 1

## 2020-03-19 MED ORDER — SODIUM CHLORIDE 0.9 % IV SOLN
150.0000 mg | Freq: Once | INTRAVENOUS | Status: AC
  Administered 2020-03-19: 12:00:00 150 mg via INTRAVENOUS
  Filled 2020-03-19: qty 150

## 2020-03-19 MED ORDER — DEXAMETHASONE SODIUM PHOSPHATE 10 MG/ML IJ SOLN
10.0000 mg | Freq: Once | INTRAMUSCULAR | Status: AC
  Administered 2020-03-19: 12:00:00 10 mg via INTRAVENOUS

## 2020-03-19 MED ORDER — DIPHENHYDRAMINE HCL 50 MG/ML IJ SOLN
50.0000 mg | Freq: Once | INTRAMUSCULAR | Status: AC
  Administered 2020-03-19: 12:00:00 50 mg via INTRAVENOUS

## 2020-03-19 MED ORDER — PALONOSETRON HCL INJECTION 0.25 MG/5ML
INTRAVENOUS | Status: AC
  Filled 2020-03-19: qty 5

## 2020-03-19 MED ORDER — FAMOTIDINE IN NACL 20-0.9 MG/50ML-% IV SOLN
INTRAVENOUS | Status: AC
  Filled 2020-03-19: qty 50

## 2020-03-19 MED ORDER — FAMOTIDINE IN NACL 20-0.9 MG/50ML-% IV SOLN
20.0000 mg | Freq: Once | INTRAVENOUS | Status: AC
  Administered 2020-03-19: 20 mg via INTRAVENOUS

## 2020-03-19 MED ORDER — SODIUM CHLORIDE 0.9 % IV SOLN
Freq: Once | INTRAVENOUS | Status: AC
  Filled 2020-03-19: qty 250

## 2020-03-19 MED ORDER — SODIUM CHLORIDE 0.9 % IV SOLN
639.0000 mg | Freq: Once | INTRAVENOUS | Status: AC
  Administered 2020-03-19: 640 mg via INTRAVENOUS
  Filled 2020-03-19: qty 64

## 2020-03-19 MED ORDER — DIPHENHYDRAMINE HCL 50 MG/ML IJ SOLN
INTRAMUSCULAR | Status: AC
  Filled 2020-03-19: qty 1

## 2020-03-19 NOTE — Patient Instructions (Signed)
[place] [facility] Discharge Instructions for Patients Receiving Chemotherapy  Today you received the following chemotherapy agents: taxol, Carboplatin   To help prevent nausea and vomiting after your treatment, we encourage you to take your nausea medication as directed.    If you develop nausea and vomiting that is not controlled by your nausea medication, call the clinic.   BELOW ARE SYMPTOMS THAT SHOULD BE REPORTED IMMEDIATELY:  *FEVER GREATER THAN 100.5 F  *CHILLS WITH OR WITHOUT FEVER  NAUSEA AND VOMITING THAT IS NOT CONTROLLED WITH YOUR NAUSEA MEDICATION  *UNUSUAL SHORTNESS OF BREATH  *UNUSUAL BRUISING OR BLEEDING  TENDERNESS IN MOUTH AND THROAT WITH OR WITHOUT PRESENCE OF ULCERS  *URINARY PROBLEMS  *BOWEL PROBLEMS  UNUSUAL RASH Items with * indicate a potential emergency and should be followed up as soon as possible.  Feel free to call the clinic should you have any questions or concerns. The clinic phone number is [phone].  Please show the [facility] at check-in to the Emergency Department and triage nurse.

## 2020-03-21 ENCOUNTER — Ambulatory Visit
Admission: RE | Admit: 2020-03-21 | Discharge: 2020-03-21 | Disposition: A | Discharge status: Home / Self Care | Payer: Medicare Other | Source: Ambulatory Visit | Attending: Cardiothoracic Surgery | Admitting: Cardiothoracic Surgery

## 2020-03-21 ENCOUNTER — Other Ambulatory Visit: Payer: Self-pay

## 2020-03-21 ENCOUNTER — Inpatient Hospital Stay: Payer: Medicare Other | Attending: Internal Medicine

## 2020-03-21 ENCOUNTER — Ambulatory Visit (INDEPENDENT_AMBULATORY_CARE_PROVIDER_SITE_OTHER): Payer: Self-pay | Admitting: Cardiothoracic Surgery

## 2020-03-21 VITALS — BP 135/66 | HR 68 | Temp 98.1°F | Resp 20 | Ht 69.0 in | Wt 200.0 lb

## 2020-03-21 VITALS — BP 112/53 | HR 65 | Temp 98.7°F | Resp 20

## 2020-03-21 DIAGNOSIS — R1084 Generalized abdominal pain: Secondary | ICD-10-CM

## 2020-03-21 DIAGNOSIS — K219 Gastro-esophageal reflux disease without esophagitis: Secondary | ICD-10-CM | POA: Insufficient documentation

## 2020-03-21 DIAGNOSIS — Z79899 Other long term (current) drug therapy: Secondary | ICD-10-CM | POA: Diagnosis not present

## 2020-03-21 DIAGNOSIS — C3491 Malignant neoplasm of unspecified part of right bronchus or lung: Secondary | ICD-10-CM

## 2020-03-21 DIAGNOSIS — Z5111 Encounter for antineoplastic chemotherapy: Secondary | ICD-10-CM | POA: Insufficient documentation

## 2020-03-21 DIAGNOSIS — I2510 Atherosclerotic heart disease of native coronary artery without angina pectoris: Secondary | ICD-10-CM | POA: Diagnosis not present

## 2020-03-21 DIAGNOSIS — C3411 Malignant neoplasm of upper lobe, right bronchus or lung: Secondary | ICD-10-CM | POA: Diagnosis not present

## 2020-03-21 DIAGNOSIS — Z7982 Long term (current) use of aspirin: Secondary | ICD-10-CM | POA: Insufficient documentation

## 2020-03-21 DIAGNOSIS — I10 Essential (primary) hypertension: Secondary | ICD-10-CM | POA: Diagnosis not present

## 2020-03-21 DIAGNOSIS — Z902 Acquired absence of lung [part of]: Secondary | ICD-10-CM

## 2020-03-21 DIAGNOSIS — N200 Calculus of kidney: Secondary | ICD-10-CM | POA: Diagnosis not present

## 2020-03-21 DIAGNOSIS — Z9001 Acquired absence of eye: Secondary | ICD-10-CM | POA: Diagnosis not present

## 2020-03-21 DIAGNOSIS — I252 Old myocardial infarction: Secondary | ICD-10-CM | POA: Diagnosis not present

## 2020-03-21 DIAGNOSIS — E785 Hyperlipidemia, unspecified: Secondary | ICD-10-CM | POA: Diagnosis not present

## 2020-03-21 DIAGNOSIS — Z09 Encounter for follow-up examination after completed treatment for conditions other than malignant neoplasm: Secondary | ICD-10-CM

## 2020-03-21 DIAGNOSIS — Z5189 Encounter for other specified aftercare: Secondary | ICD-10-CM | POA: Diagnosis not present

## 2020-03-21 DIAGNOSIS — R0602 Shortness of breath: Secondary | ICD-10-CM | POA: Diagnosis not present

## 2020-03-21 MED ORDER — PEGFILGRASTIM-JMDB 6 MG/0.6ML ~~LOC~~ SOSY
6.0000 mg | PREFILLED_SYRINGE | Freq: Once | SUBCUTANEOUS | Status: AC
  Administered 2020-03-21: 12:00:00 6 mg via SUBCUTANEOUS

## 2020-03-21 MED ORDER — PEGFILGRASTIM-JMDB 6 MG/0.6ML ~~LOC~~ SOSY
PREFILLED_SYRINGE | SUBCUTANEOUS | Status: AC
  Filled 2020-03-21: qty 0.6

## 2020-03-21 NOTE — Patient Instructions (Signed)

## 2020-03-21 NOTE — Progress Notes (Signed)
[address]       [place] [address]             [phone]                  Daniel [facility] Medical Record #[id number] Date of Birth: 10-22-1946  Referring TG:[id number], Quillian Quince, MD Primary Cardiology: Primary Care:[place], Quillian Quince, MD  Chief Complaint:  Follow Up Visit OPERATIVE REPORT DATE OF PROCEDURE:  01/24/2020 PREOPERATIVE DIAGNOSIS:  A 2 cm right upper lobe lung mass found on surveillance lung cancer screening CT. POSTOPERATIVE DIAGNOSES:   1.  Malignant neoplasm, right upper lobe, 2.  Sarcomatous changes on frozen section. PROCEDURES PERFORMED:   1.  Bronchoscopy.   2.  Robotic-assisted wedge resection- right upper lobe with completion right upper lobe lobectomy, lymph node dissection, and intercostal nerve block. SURGEON:  Lanelle Bal, MD  Cancer Staging Lung cancer [facility]) Staging form: Lung, AJCC 8th Edition - Pathologic stage from 01/29/2020: Stage IIB (pT1c, pN1, cM0) - Signed by Grace Isaac, MD on 01/29/2020  SURGICAL PATHOLOGY  CASE: [id number]  PATIENT: Daniel Colon  Surgical Pathology Report   Clinical History: pulmonary nodule, subpleural nodule in right upper  lobe (cm)   FINAL MICROSCOPIC DIAGNOSIS:   A. LUNG, RIGHT UPPER LOBE, WEDGE RESECTION:  - Poorly differentiated carcinoma with sarcomatoid features, 2.2 cm.  - See oncology table and comment.   B. LYMPH NODE, LEVEL 7, EXCISION:  - Anthracotic lymph node with no metastatic carcinoma.   C. LYMPH NODE, 10R, EXCISION:  - Anthracotic lymph node with no metastatic carcinoma.   D. LYMPH NODE, 10R #2, EXCISION:  - Anthracotic lymph node with no metastatic carcinoma.   E. LYMPH NODE, 12R, EXCISION:  - Anthracotic lymph node with no metastatic carcinoma.   F. LYMPH NODE, 10R #3, EXCISION:  - Anthracotic lymph node with no metastatic carcinoma.   G. LYMPH NODE, 10R #4, EXCISION:  - Anthracotic lymph node with no metastatic carcinoma.   H. LYMPH  NODE, 11R, EXCISION:  - Anthracotic lymph node with no metastatic carcinoma.   I. LYMPH NODE, 11R #2, EXCISION:  - Anthracotic lymph node with no metastatic carcinoma.   J. LYMPH NODE, 11R #3, EXCISION:  - Anthracotic lymph node with no metastatic carcinoma.   K. LYMPH NODE, 11R #4, EXCISION:  - Anthracotic lymph node with no metastatic carcinoma.   L. LUNG, RIGHT UPPER LOBE COMPLETION, LOBECTOMY:  - Findings consistent with previous wedge biopsy.  - Final margins free of tumor.  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   ONCOLOGY TABLE:  LUNG: Resection  Procedure: Right upper lobe wedge biopsy, right upper lobectomy and  lymph node biopsies.  Specimen Laterality: Right  Tumor Site: Right upper lobe.  Tumor Size: 2.2 x 1.5 x 0.8 cm.  Tumor Focality: Unifocal.  Histologic Type: Poorly differentiated carcinoma with f features  (sarcomatoid carcinoma).  Visceral Pleura Invasion: Involves subpleural connective tissue but does  not extend to pleural surface.  Lymphovascular Invasion: Present.  Direct Invasion of Adjacent Structures: No adjacent structures  submitted.  Margins: Free of tumor.  Treatment Effect: No known presurgical therapy.  Regional Lymph Nodes:    Number of Lymph Nodes Involved: 1    Number of Lymph Nodes Examined: 12  Pathologic Stage Classification (pTNM, AJCC 8th Edition): pT1c, pN1  Ancillary Studies: Can be performed if requested.  Representative Tumor Block: A2, A3 and A4.  Comment(s): The tumor is poorly differentiated with large areas of  diffuse sheets of malignant cells some of which have spindle cell  features with marked nuclear pleomorphism and frequent mitotic figures.  There are areas with epithelioid features with abundant eosinophilic  cytoplasm. Immunohistochemistry shows positivity with cytokeratin  AE1/AE3, cytokeratin 7 and TTF-1. The tumor is negative with  cytokeratin 5/6, p40, p63,  Napsin-A, MOC31, cytokeratin 20, and CDX-2.  The immunophenotype is consistent with poorly differentiated carcinoma  and the morphology shows sarcomatoid features (sarcomatoid carcinoma).   Dr. Tresa Moore has reviewed slides of the tumor and agrees.  (v4.1.0.1)   History of Present Illness:     Patient returns to the office today with a follow-up chest x-ray after recent robotic right upper lobectomy for a poorly differentiated 2 cm right upper lobe mass with single positive hilar lymph node.  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   He has noted paresthesias around the right lower chest and extending to the right upper abdomen.    His physical exertion tolerance has continued to increase , he does note some shortness of breath climbing hills but is relatively asymptomatic walking 2 to 3 miles a day on the level    With the positive hilar lymph node he was referred to medical oncology-seen by Dr. Earlie Server he has started 4 cycles of chemotherapy, every 21 days-he has now completed cycle 2    Zubrod Score: At the time of surgery this patient's most appropriate activity status/level should be described as: []     0    Normal activity, no symptoms [x]     1    Restricted in physical strenuous activity but ambulatory, able to do out light work []     2    Ambulatory and capable of self care, unable to do work activities, up and about                 >50 % of waking hours                                                                                   []     3    Only limited self care, in bed greater than 50% of waking hours []     4    Completely disabled, no self care, confined to bed or chair []     5    Moribund  Social History   Tobacco Use  Smoking Status Former Smoker  . Packs/day: 1.00  . Years: 42.00  . Pack years: 42.00  . Types: Cigarettes  . Quit date: 06/21/2011  . Years since quitting: 8.7    Smokeless Tobacco Current User  . Types: Chew       No Known Allergies  Current Outpatient Medications  Medication Sig Dispense Refill  . acetaminophen (TYLENOL) 500 MG tablet Take 500-1,000 mg by mouth every 6 (six) hours as needed (for pain.).    Marland Kitchen aspirin EC 81 MG tablet Take 81 mg by mouth 3 (three) times a week. In the morning    . atorvastatin (LIPITOR) 40 MG  tablet TAKE 1 TABLET DAILY (Patient taking differently: Take 40 mg by mouth daily. ) 90 tablet 3  . carvedilol (COREG) 12.5 MG tablet Take 12.5 mg by mouth daily.     . Coenzyme Q10 (CO Q-10) 100 MG CAPS Take 100 mg by mouth daily.     Marland Kitchen docusate sodium (COLACE) 100 MG capsule Take 100 mg by mouth daily as needed for mild constipation.    Marland Kitchen ezetimibe (ZETIA) 10 MG tablet Take 10 mg by mouth daily.    . fish oil-omega-3 fatty acids 1000 MG capsule Take 1,000 mg by mouth daily.     Marland Kitchen guaiFENesin (MUCINEX) 600 MG 12 hr tablet Take 1 tablet (600 mg total) by mouth 2 (two) times daily as needed for cough or to loosen phlegm.    Marland Kitchen ipratropium (ATROVENT) 0.03 % nasal spray Place 1 spray into both nostrils daily.     . Multiple Vitamin (MULTIVITAMIN WITH MINERALS) TABS tablet Take 1 tablet by mouth daily.    . nitroGLYCERIN (NITROLINGUAL) 0.4 MG/SPRAY spray USE 1 SPRAY ON OR UNDER THE TONGUE EVERY 5 MINUTES FOR 3 DOSES AS NEEDED FOR CHEST PAIN AS DIRECTED BY PRESCRIBER OR PACKAGE INSTRUCTIONS 24 g 3  . omeprazole (PRILOSEC) 10 MG capsule Take 10 mg by mouth daily.    Marland Kitchen oxybutynin (DITROPAN) 5 MG tablet Take 5 mg by mouth daily.     . prochlorperazine (COMPAZINE) 10 MG tablet Take 1 tablet (10 mg total) by mouth every 6 (six) hours as needed for nausea or vomiting. 30 tablet 0  . senna (SENOKOT) 176 MG/5ML SYRP Take by mouth.    . Sennosides (SENNA LAXATIVE) 25 MG TABS Take 1 tablet by mouth daily.    Marland Kitchen telmisartan-hydrochlorothiazide (MICARDIS HCT) 80-12.5 MG tablet Take 1 tablet by mouth daily. 90 tablet 3  . traMADol (ULTRAM) 50 MG  tablet Take 1 tablet (50 mg total) by mouth every 6 (six) hours as needed (mild pain). 20 tablet 0   No current facility-administered medications for this visit.       Physical Exam: BP 135/66 (BP Location: Right Arm, Patient Position: Sitting, Cuff Size: Normal)   Pulse 68   Temp 98.1 F (36.7 C) (Temporal)   Resp 20   Ht 5\' 9"  (1.753 m)   Wt 200 lb (90.7 kg)   SpO2 97% Comment: RA  BMI 29.53 kg/m  General appearance: alert and cooperative Head: Normocephalic, without obvious abnormality, atraumatic Neck: no adenopathy, no carotid bruit, no JVD, supple, symmetrical, trachea midline and thyroid not enlarged, symmetric, no tenderness/mass/nodules Resp: clear to auscultation bilaterally GI: soft, non-tender; bowel sounds normal; no masses,  no organomegaly Extremities: extremities normal, atraumatic, no cyanosis or edema and Homans sign is negative, no sign of DVT Neurologic: Grossly normal   Diagnostic Studies & Laboratory data:         Recent Radiology Findings: DG Chest 2 View  Result Date: 03/21/2020 CLINICAL DATA:  Post RIGHT upper lobectomy, shortness of breath EXAM: CHEST - 2 VIEW COMPARISON:  02/22/2020 FINDINGS: Normal heart size and pulmonary vascularity. Postsurgical changes from RIGHT upper lobectomy with hydropneumothorax identified at RIGHT apex, decreased since previous study. Minimal LEFT basilar atelectasis and LEFT apex scarring. Remaining lungs clear. No acute osseous findings. IMPRESSION: Decreased size of loculated hydropneumothorax at RIGHT apex post RIGHT upper lobectomy. Minimal LEFT basilar atelectasis. Electronically Signed   By: Lavonia Dana M.D.   On: 03/21/2020 15:28   DG Abd 2 Views  Result Date: 03/21/2020 CLINICAL DATA:  Abdominal pain.  Right flank pain. EXAM: ABDOMEN - 2 VIEW COMPARISON:  No recent. FINDINGS: Surgical sutures upper abdomen. No bowel distention. Stool noted throughout the colon. Aortoiliac and visceral vascular calcification. 1 cm  calcific density noted over the right kidney. This is consistent with right nephrolithiasis. Pelvic calcifications noted. These could represent phleboliths. Distal ureteral or bladder stones cannot be excluded. Aortoiliac and visceral atherosclerotic vascular calcification. Degenerative change thoracolumbar spine and both hips. IMPRESSION: 1 cm stone noted projected over the right kidney consistent with nephrolithiasis. Pelvic calcifications are noted. These could represent phleboliths. Distal ureteral or bladder stones cannot be excluded. Electronically Signed   By: Marcello Moores  Register   On: 03/21/2020 15:28      I have independently reviewed the above radiology findings and reviewed findings  with the patient.  Recent Labs: Lab Results  Component Value Date   WBC 11.3 (H) 03/19/2020   HGB 10.9 (L) 03/19/2020   HCT 34.1 (L) 03/19/2020   PLT 248 03/19/2020   GLUCOSE 89 03/19/2020   ALT 19 03/19/2020   AST 14 (L) 03/19/2020   NA 138 03/19/2020   K 4.3 03/19/2020   CL 107 03/19/2020   CREATININE 1.10 03/19/2020   BUN 24 (H) 03/19/2020   CO2 20 (L) 03/19/2020   INR 1.0 01/22/2020      Assessment / Plan:   Patient stable after robotic right upper lobectomy and lymph node dissection without with what ultimately was found to be poorly differentiated carcinoma with sarcomatous features,-stage IIb.   Currently on round 2 of chemotherapy- Adjuvant systemic chemotherapy with carboplatin for AUC of 6 and paclitaxel 200 mg/M2 every 3 weeks with Neulasta support.  First dose February 28, 2020.  Paresthesias over the right anterior chest and upper abdomen related to trauma to intercostal nerves this is been explained to the patient   Plan to see him back in 6 weeks with a follow-up chest x-ray       Medication Changes: No orders of the defined types were placed in this encounter.      Grace Isaac 03/21/2020 4:07 PM

## 2020-03-27 ENCOUNTER — Inpatient Hospital Stay: Payer: Medicare Other

## 2020-03-27 ENCOUNTER — Other Ambulatory Visit: Payer: Self-pay

## 2020-03-27 DIAGNOSIS — I2510 Atherosclerotic heart disease of native coronary artery without angina pectoris: Secondary | ICD-10-CM | POA: Diagnosis not present

## 2020-03-27 DIAGNOSIS — Z5189 Encounter for other specified aftercare: Secondary | ICD-10-CM | POA: Diagnosis not present

## 2020-03-27 DIAGNOSIS — Z5111 Encounter for antineoplastic chemotherapy: Secondary | ICD-10-CM | POA: Diagnosis not present

## 2020-03-27 DIAGNOSIS — C3411 Malignant neoplasm of upper lobe, right bronchus or lung: Secondary | ICD-10-CM | POA: Diagnosis not present

## 2020-03-27 DIAGNOSIS — C3490 Malignant neoplasm of unspecified part of unspecified bronchus or lung: Secondary | ICD-10-CM

## 2020-03-27 DIAGNOSIS — E785 Hyperlipidemia, unspecified: Secondary | ICD-10-CM | POA: Diagnosis not present

## 2020-03-27 DIAGNOSIS — I10 Essential (primary) hypertension: Secondary | ICD-10-CM | POA: Diagnosis not present

## 2020-03-27 LAB — CMP (CANCER CENTER ONLY)
ALT: 58 U/L — ABNORMAL HIGH (ref 0–44)
AST: 23 U/L (ref 15–41)
Albumin: 3.4 g/dL — ABNORMAL LOW (ref 3.5–5.0)
Alkaline Phosphatase: 112 U/L (ref 38–126)
Anion gap: 12 (ref 5–15)
BUN: 18 mg/dL (ref 8–23)
CO2: 22 mmol/L (ref 22–32)
Calcium: 8.7 mg/dL — ABNORMAL LOW (ref 8.9–10.3)
Chloride: 106 mmol/L (ref 98–111)
Creatinine: 0.77 mg/dL (ref 0.61–1.24)
GFR, Est AFR Am: >60 mL/min (ref >60)
GFR, Estimated: >60 mL/min (ref >60)
Glucose, Bld: 99 mg/dL (ref 70–99)
Potassium: 4.1 mmol/L (ref 3.5–5.1)
Sodium: 140 mmol/L (ref 135–145)
Total Bilirubin: 0.3 mg/dL (ref 0.3–1.2)
Total Protein: 6.2 g/dL — ABNORMAL LOW (ref 6.5–8.1)

## 2020-03-27 LAB — CBC WITH DIFFERENTIAL (CANCER CENTER ONLY)
Abs Immature Granulocytes: 0.72 K/uL — ABNORMAL HIGH (ref 0.00–0.07)
Basophils Absolute: 0.1 K/uL (ref 0.0–0.1)
Basophils Relative: 1 %
Eosinophils Absolute: 0.3 K/uL (ref 0.0–0.5)
Eosinophils Relative: 2 %
HCT: 31 % — ABNORMAL LOW (ref 39.0–52.0)
Hemoglobin: 9.8 g/dL — ABNORMAL LOW (ref 13.0–17.0)
Immature Granulocytes: 5 %
Lymphocytes Relative: 12 %
Lymphs Abs: 1.7 K/uL (ref 0.7–4.0)
MCH: 26.2 pg (ref 26.0–34.0)
MCHC: 31.6 g/dL (ref 30.0–36.0)
MCV: 82.9 fL (ref 80.0–100.0)
Monocytes Absolute: 1.4 K/uL — ABNORMAL HIGH (ref 0.1–1.0)
Monocytes Relative: 10 %
Neutro Abs: 9.9 K/uL — ABNORMAL HIGH (ref 1.7–7.7)
Neutrophils Relative %: 70 %
Platelet Count: 171 K/uL (ref 150–400)
RBC: 3.74 MIL/uL — ABNORMAL LOW (ref 4.22–5.81)
RDW: 17.4 % — ABNORMAL HIGH (ref 11.5–15.5)
WBC Count: 14.2 K/uL — ABNORMAL HIGH (ref 4.0–10.5)
nRBC: 0.6 % — ABNORMAL HIGH (ref 0.0–0.2)

## 2020-04-03 ENCOUNTER — Inpatient Hospital Stay: Payer: Medicare Other

## 2020-04-03 ENCOUNTER — Other Ambulatory Visit: Payer: Self-pay

## 2020-04-03 DIAGNOSIS — I10 Essential (primary) hypertension: Secondary | ICD-10-CM | POA: Diagnosis not present

## 2020-04-03 DIAGNOSIS — C3490 Malignant neoplasm of unspecified part of unspecified bronchus or lung: Secondary | ICD-10-CM

## 2020-04-03 DIAGNOSIS — Z5111 Encounter for antineoplastic chemotherapy: Secondary | ICD-10-CM | POA: Diagnosis not present

## 2020-04-03 DIAGNOSIS — E785 Hyperlipidemia, unspecified: Secondary | ICD-10-CM | POA: Diagnosis not present

## 2020-04-03 DIAGNOSIS — C3411 Malignant neoplasm of upper lobe, right bronchus or lung: Secondary | ICD-10-CM | POA: Diagnosis not present

## 2020-04-03 DIAGNOSIS — Z5189 Encounter for other specified aftercare: Secondary | ICD-10-CM | POA: Diagnosis not present

## 2020-04-03 DIAGNOSIS — I2510 Atherosclerotic heart disease of native coronary artery without angina pectoris: Secondary | ICD-10-CM | POA: Diagnosis not present

## 2020-04-03 LAB — CBC WITH DIFFERENTIAL (CANCER CENTER ONLY)
Abs Immature Granulocytes: 0.08 10*3/uL — ABNORMAL HIGH (ref 0.00–0.07)
Basophils Absolute: 0 10*3/uL (ref 0.0–0.1)
Basophils Relative: 0 %
Eosinophils Absolute: 0 10*3/uL (ref 0.0–0.5)
Eosinophils Relative: 0 %
HCT: 32.7 % — ABNORMAL LOW (ref 39.0–52.0)
Hemoglobin: 10.3 g/dL — ABNORMAL LOW (ref 13.0–17.0)
Immature Granulocytes: 1 %
Lymphocytes Relative: 10 %
Lymphs Abs: 0.9 10*3/uL (ref 0.7–4.0)
MCH: 26.3 pg (ref 26.0–34.0)
MCHC: 31.5 g/dL (ref 30.0–36.0)
MCV: 83.4 fL (ref 80.0–100.0)
Monocytes Absolute: 0.6 10*3/uL (ref 0.1–1.0)
Monocytes Relative: 7 %
Neutro Abs: 7.5 10*3/uL (ref 1.7–7.7)
Neutrophils Relative %: 82 %
Platelet Count: 151 10*3/uL (ref 150–400)
RBC: 3.92 MIL/uL — ABNORMAL LOW (ref 4.22–5.81)
RDW: 18.7 % — ABNORMAL HIGH (ref 11.5–15.5)
WBC Count: 9.2 10*3/uL (ref 4.0–10.5)
nRBC: 0 % (ref 0.0–0.2)

## 2020-04-03 LAB — CMP (CANCER CENTER ONLY)
ALT: 32 U/L (ref 0–44)
AST: 16 U/L (ref 15–41)
Albumin: 3.5 g/dL (ref 3.5–5.0)
Alkaline Phosphatase: 98 U/L (ref 38–126)
Anion gap: 10 (ref 5–15)
BUN: 22 mg/dL (ref 8–23)
CO2: 22 mmol/L (ref 22–32)
Calcium: 8.6 mg/dL — ABNORMAL LOW (ref 8.9–10.3)
Chloride: 107 mmol/L (ref 98–111)
Creatinine: 0.85 mg/dL (ref 0.61–1.24)
GFR, Est AFR Am: >60 mL/min (ref >60)
GFR, Estimated: >60 mL/min (ref >60)
Glucose, Bld: 99 mg/dL (ref 70–99)
Potassium: 4 mmol/L (ref 3.5–5.1)
Sodium: 139 mmol/L (ref 135–145)
Total Bilirubin: 0.5 mg/dL (ref 0.3–1.2)
Total Protein: 6.4 g/dL — ABNORMAL LOW (ref 6.5–8.1)

## 2020-04-04 NOTE — Progress Notes (Signed)
Pharmacist Chemotherapy Monitoring - Follow Up Assessment    I verify that I have reviewed each item in the below checklist:  . Regimen for the patient is scheduled for the appropriate day and plan matches scheduled date. Marland Kitchen Appropriate non-routine labs are ordered dependent on drug ordered. . If applicable, additional medications reviewed and ordered per protocol based on lifetime cumulative doses and/or treatment regimen.   Plan for follow-up and/or issues identified: No . I-vent associated with next due treatment: No . MD and/or nursing notified: No  Daniel Colon [facility] 04/04/2020 10:21 AM

## 2020-04-08 DIAGNOSIS — L821 Other seborrheic keratosis: Secondary | ICD-10-CM | POA: Diagnosis not present

## 2020-04-08 DIAGNOSIS — Z85828 Personal history of other malignant neoplasm of skin: Secondary | ICD-10-CM | POA: Diagnosis not present

## 2020-04-08 DIAGNOSIS — L570 Actinic keratosis: Secondary | ICD-10-CM | POA: Diagnosis not present

## 2020-04-08 DIAGNOSIS — D485 Neoplasm of uncertain behavior of skin: Secondary | ICD-10-CM | POA: Diagnosis not present

## 2020-04-08 DIAGNOSIS — D225 Melanocytic nevi of trunk: Secondary | ICD-10-CM | POA: Diagnosis not present

## 2020-04-08 DIAGNOSIS — D1801 Hemangioma of skin and subcutaneous tissue: Secondary | ICD-10-CM | POA: Diagnosis not present

## 2020-04-10 ENCOUNTER — Encounter: Payer: Self-pay | Admitting: Internal Medicine

## 2020-04-10 ENCOUNTER — Other Ambulatory Visit: Payer: Self-pay

## 2020-04-10 ENCOUNTER — Inpatient Hospital Stay: Payer: Medicare Other

## 2020-04-10 ENCOUNTER — Inpatient Hospital Stay (HOSPITAL_BASED_OUTPATIENT_CLINIC_OR_DEPARTMENT_OTHER): Payer: Medicare Other | Admitting: Internal Medicine

## 2020-04-10 VITALS — BP 131/41 | HR 68 | Temp 98.7°F | Resp 18 | Ht 69.0 in | Wt 209.6 lb

## 2020-04-10 DIAGNOSIS — C3411 Malignant neoplasm of upper lobe, right bronchus or lung: Secondary | ICD-10-CM

## 2020-04-10 DIAGNOSIS — C3491 Malignant neoplasm of unspecified part of right bronchus or lung: Secondary | ICD-10-CM

## 2020-04-10 DIAGNOSIS — I10 Essential (primary) hypertension: Secondary | ICD-10-CM

## 2020-04-10 DIAGNOSIS — Z5111 Encounter for antineoplastic chemotherapy: Secondary | ICD-10-CM | POA: Diagnosis not present

## 2020-04-10 DIAGNOSIS — E785 Hyperlipidemia, unspecified: Secondary | ICD-10-CM | POA: Diagnosis not present

## 2020-04-10 DIAGNOSIS — I2510 Atherosclerotic heart disease of native coronary artery without angina pectoris: Secondary | ICD-10-CM | POA: Diagnosis not present

## 2020-04-10 DIAGNOSIS — Z5189 Encounter for other specified aftercare: Secondary | ICD-10-CM | POA: Diagnosis not present

## 2020-04-10 DIAGNOSIS — C3490 Malignant neoplasm of unspecified part of unspecified bronchus or lung: Secondary | ICD-10-CM

## 2020-04-10 LAB — CBC WITH DIFFERENTIAL (CANCER CENTER ONLY)
Abs Immature Granulocytes: 0.04 10*3/uL (ref 0.00–0.07)
Basophils Absolute: 0 10*3/uL (ref 0.0–0.1)
Basophils Relative: 1 %
Eosinophils Absolute: 0 10*3/uL (ref 0.0–0.5)
Eosinophils Relative: 1 %
HCT: 31.1 % — ABNORMAL LOW (ref 39.0–52.0)
Hemoglobin: 9.8 g/dL — ABNORMAL LOW (ref 13.0–17.0)
Immature Granulocytes: 1 %
Lymphocytes Relative: 13 %
Lymphs Abs: 0.9 10*3/uL (ref 0.7–4.0)
MCH: 26.5 pg (ref 26.0–34.0)
MCHC: 31.5 g/dL (ref 30.0–36.0)
MCV: 84.1 fL (ref 80.0–100.0)
Monocytes Absolute: 0.6 10*3/uL (ref 0.1–1.0)
Monocytes Relative: 8 %
Neutro Abs: 5.4 10*3/uL (ref 1.7–7.7)
Neutrophils Relative %: 76 %
Platelet Count: 139 10*3/uL — ABNORMAL LOW (ref 150–400)
RBC: 3.7 MIL/uL — ABNORMAL LOW (ref 4.22–5.81)
RDW: 19.3 % — ABNORMAL HIGH (ref 11.5–15.5)
WBC Count: 7 10*3/uL (ref 4.0–10.5)
nRBC: 0 % (ref 0.0–0.2)

## 2020-04-10 LAB — CMP (CANCER CENTER ONLY)
ALT: 16 U/L (ref 0–44)
AST: 13 U/L — ABNORMAL LOW (ref 15–41)
Albumin: 3.3 g/dL — ABNORMAL LOW (ref 3.5–5.0)
Alkaline Phosphatase: 77 U/L (ref 38–126)
Anion gap: 13 (ref 5–15)
BUN: 21 mg/dL (ref 8–23)
CO2: 20 mmol/L — ABNORMAL LOW (ref 22–32)
Calcium: 8.2 mg/dL — ABNORMAL LOW (ref 8.9–10.3)
Chloride: 111 mmol/L (ref 98–111)
Creatinine: 0.83 mg/dL (ref 0.61–1.24)
GFR, Est AFR Am: >60 mL/min (ref >60)
GFR, Estimated: >60 mL/min (ref >60)
Glucose, Bld: 124 mg/dL — ABNORMAL HIGH (ref 70–99)
Potassium: 4.4 mmol/L (ref 3.5–5.1)
Sodium: 144 mmol/L (ref 135–145)
Total Bilirubin: 0.4 mg/dL (ref 0.3–1.2)
Total Protein: 5.9 g/dL — ABNORMAL LOW (ref 6.5–8.1)

## 2020-04-10 MED ORDER — SODIUM CHLORIDE 0.9 % IV SOLN
640.0000 mg | Freq: Once | INTRAVENOUS | Status: AC
  Administered 2020-04-10: 16:00:00 640 mg via INTRAVENOUS
  Filled 2020-04-10: qty 64

## 2020-04-10 MED ORDER — FAMOTIDINE IN NACL 20-0.9 MG/50ML-% IV SOLN
INTRAVENOUS | Status: AC
  Filled 2020-04-10: qty 50

## 2020-04-10 MED ORDER — DIPHENHYDRAMINE HCL 50 MG/ML IJ SOLN
50.0000 mg | Freq: Once | INTRAMUSCULAR | Status: AC
  Administered 2020-04-10: 11:00:00 50 mg via INTRAVENOUS

## 2020-04-10 MED ORDER — PALONOSETRON HCL INJECTION 0.25 MG/5ML
INTRAVENOUS | Status: AC
  Filled 2020-04-10: qty 5

## 2020-04-10 MED ORDER — FAMOTIDINE IN NACL 20-0.9 MG/50ML-% IV SOLN
20.0000 mg | Freq: Once | INTRAVENOUS | Status: AC
  Administered 2020-04-10: 20 mg via INTRAVENOUS

## 2020-04-10 MED ORDER — SODIUM CHLORIDE 0.9 % IV SOLN
10.0000 mg | Freq: Once | INTRAVENOUS | Status: AC
  Administered 2020-04-10: 10 mg via INTRAVENOUS
  Filled 2020-04-10: qty 10

## 2020-04-10 MED ORDER — SODIUM CHLORIDE 0.9 % IV SOLN
150.0000 mg | Freq: Once | INTRAVENOUS | Status: AC
  Administered 2020-04-10: 12:00:00 150 mg via INTRAVENOUS
  Filled 2020-04-10: qty 150

## 2020-04-10 MED ORDER — PALONOSETRON HCL INJECTION 0.25 MG/5ML
0.2500 mg | Freq: Once | INTRAVENOUS | Status: AC
  Administered 2020-04-10: 11:00:00 0.25 mg via INTRAVENOUS

## 2020-04-10 MED ORDER — SODIUM CHLORIDE 0.9 % IV SOLN
200.0000 mg/m2 | Freq: Once | INTRAVENOUS | Status: AC
  Administered 2020-04-10: 13:00:00 426 mg via INTRAVENOUS
  Filled 2020-04-10: qty 71

## 2020-04-10 MED ORDER — DIPHENHYDRAMINE HCL 50 MG/ML IJ SOLN
INTRAMUSCULAR | Status: AC
  Filled 2020-04-10: qty 1

## 2020-04-10 MED ORDER — SODIUM CHLORIDE 0.9 % IV SOLN
Freq: Once | INTRAVENOUS | Status: AC
  Filled 2020-04-10: qty 250

## 2020-04-10 NOTE — Patient Instructions (Signed)
[facility] Discharge Instructions for Patients Receiving Chemotherapy  Today you received the following chemotherapy agents: taxol, Carboplatin   To help prevent nausea and vomiting after your treatment, we encourage you to take your nausea medication as directed.    If you develop nausea and vomiting that is not controlled by your nausea medication, call the clinic.   BELOW ARE SYMPTOMS THAT SHOULD BE REPORTED IMMEDIATELY:  *FEVER GREATER THAN 100.5 F  *CHILLS WITH OR WITHOUT FEVER  NAUSEA AND VOMITING THAT IS NOT CONTROLLED WITH YOUR NAUSEA MEDICATION  *UNUSUAL SHORTNESS OF BREATH  *UNUSUAL BRUISING OR BLEEDING  TENDERNESS IN MOUTH AND THROAT WITH OR WITHOUT PRESENCE OF ULCERS  *URINARY PROBLEMS  *BOWEL PROBLEMS  UNUSUAL RASH Items with * indicate a potential emergency and should be followed up as soon as possible.  Feel free to call the clinic should you have any questions or concerns. The clinic phone number is [phone].  Please show the [facility] at check-in to the Emergency Department and triage nurse.

## 2020-04-10 NOTE — Progress Notes (Signed)
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 Servando Snare  CURRENT THERAPY: Adjuvant systemic chemotherapy with carboplatin for AUC of 6 and paclitaxel 200 mg/M2 every 3 weeks with Neulasta support.  First dose February 28, 2020.  Status post 2 cycles.  INTERVAL HISTORY: Daniel Colon 74 y.o. male returns to the clinic today for follow-up visit accompanied by his wife.  The patient is feeling fine today with no concerning complaints except for generalized weakness.  He also has muscle and joint pain after the last dose of the chemotherapy and Neulasta.  He denied having any current chest pain, shortness of breath, cough or hemoptysis.  He denied having any fever or chills.  He has no nausea, vomiting, diarrhea or constipation.  He has no headache or visual changes.  He is here today for evaluation before starting cycle #3 of his adjuvant systemic chemotherapy.   MEDICAL HISTORY: Past Medical History:  Diagnosis Date  . Allergy   . Arthritis   . Barrett's esophagus   . CAD (coronary artery disease)    a. Stent to the [facility] (in [place]);  b. ETT(11/13/13): abnormal with early ST changes to suggest ischemia;   c. LHC (11/17/13):  pLAD 30-40, D1 30, mLAD 70-75, mCFX 70, mRCA stent 40 (ISR), EF 55-65%.  Medical Rx  . Cataract    left eye removed, right immature  . Colon polyps   . Dyslipidemia   . GERD (gastroesophageal reflux disease)   . Hypertension   . Myocardial infarction ([place]) 1996  . Nephrolithiasis    kidney stone  . PUD (peptic ulcer disease)     ALLERGIES:  has No Known Allergies.  MEDICATIONS:  Current Outpatient Medications  Medication Sig  Dispense Refill  . acetaminophen (TYLENOL) 500 MG tablet Take 500-1,000 mg by mouth every 6 (six) hours as needed (for pain.).    Marland Kitchen aspirin EC 81 MG tablet Take 81 mg by mouth 3 (three) times a week. In the morning    . atorvastatin (LIPITOR) 40 MG tablet TAKE 1 TABLET DAILY (Patient taking differently: Take 40 mg by mouth daily. ) 90 tablet 3  . carvedilol (COREG) 12.5 MG tablet Take 12.5 mg by mouth daily.     . Coenzyme Q10 (CO Q-10) 100 MG CAPS Take 100 mg by mouth daily.     Marland Kitchen docusate sodium (COLACE) 100 MG capsule Take 100 mg by mouth daily as needed for mild constipation.    Marland Kitchen ezetimibe (ZETIA) 10 MG tablet Take 10 mg by mouth daily.    . fish oil-omega-3 fatty acids 1000 MG capsule Take 1,000 mg by mouth daily.     Marland Kitchen guaiFENesin (MUCINEX) 600 MG 12 hr tablet Take 1 tablet (600 mg total) by mouth 2 (two) times daily as needed for cough or to loosen phlegm.    Marland Kitchen ipratropium (ATROVENT) 0.03 % nasal spray Place 1 spray into both nostrils daily.     . Multiple Vitamin (MULTIVITAMIN WITH MINERALS) TABS tablet Take 1 tablet by mouth daily.    . nitroGLYCERIN (NITROLINGUAL) 0.4 MG/SPRAY spray USE 1 SPRAY ON OR UNDER THE TONGUE EVERY 5 MINUTES FOR 3 DOSES AS NEEDED FOR CHEST  PAIN AS DIRECTED BY PRESCRIBER OR PACKAGE INSTRUCTIONS 24 g 3  . omeprazole (PRILOSEC) 10 MG capsule Take 10 mg by mouth daily.    Marland Kitchen oxybutynin (DITROPAN) 5 MG tablet Take 5 mg by mouth daily.     . prochlorperazine (COMPAZINE) 10 MG tablet Take 1 tablet (10 mg total) by mouth every 6 (six) hours as needed for nausea or vomiting. 30 tablet 0  . senna (SENOKOT) 176 MG/5ML SYRP Take by mouth.    . Sennosides (SENNA LAXATIVE) 25 MG TABS Take 1 tablet by mouth daily.    Marland Kitchen telmisartan-hydrochlorothiazide (MICARDIS HCT) 80-12.5 MG tablet Take 1 tablet by mouth daily. 90 tablet 3  . traMADol (ULTRAM) 50 MG tablet Take 1 tablet (50 mg total) by mouth every 6 (six) hours as needed (mild pain). 20 tablet 0   No current  facility-administered medications for this visit.    SURGICAL HISTORY:  Past Surgical History:  Procedure Laterality Date  . ANGIOPLASTY     stent - rt cor. art  . BILROTH II PROCEDURE    . COLONOSCOPY    . INTERCOSTAL NERVE BLOCK Right 01/24/2020   Procedure: Intercostal Nerve Block;  Surgeon: Grace Isaac, MD;  Location: [facility];  Service: Thoracic;  Laterality: Right;  . LEFT HEART CATHETERIZATION WITH CORONARY ANGIOGRAM N/A 11/17/2013   Procedure: LEFT HEART CATHETERIZATION WITH CORONARY ANGIOGRAM;  Surgeon: Blane Ohara, MD;  Location: [facility] CATH LAB;  Service: Cardiovascular;  Laterality: N/A;  . LYMPH NODE DISSECTION N/A 01/24/2020   Procedure: LYMPH NODE DISSECTION;  Surgeon: Grace Isaac, MD;  Location: [facility];  Service: Thoracic;  Laterality: N/A;  . POLYPECTOMY    . ulcer surgery    . ULNAR NERVE TRANSPOSITION Right 08/05/2017   Procedure: RIGHT ULNAR NERVE DECOMPRESSION;  Surgeon: Leanora Cover, MD;  Location: [facility];  Service: Orthopedics;  Laterality: Right;  Marland Kitchen VIDEO BRONCHOSCOPY N/A 01/24/2020   Procedure: VIDEO BRONCHOSCOPY;  Surgeon: Grace Isaac, MD;  Location[facility];  Service: Thoracic;  Laterality: N/A;    REVIEW OF SYSTEMS:  A comprehensive review of systems was negative except for: Constitutional: positive for fatigue Musculoskeletal: positive for arthralgias   PHYSICAL EXAMINATION: General appearance: alert, cooperative, fatigued and no distress Head: Normocephalic, without obvious abnormality, atraumatic Neck: no adenopathy, no JVD, supple, symmetrical, trachea midline and thyroid not enlarged, symmetric, no tenderness/mass/nodules Lymph nodes: Cervical, supraclavicular, and axillary nodes normal. Resp: clear to auscultation bilaterally Back: symmetric, no curvature. ROM normal. No CVA tenderness. Cardio: regular rate and rhythm, S1, S2 normal, no murmur, click, rub or gallop GI: soft, non-tender; bowel sounds normal; no masses,  no  organomegaly Extremities: extremities normal, atraumatic, no cyanosis or edema  ECOG PERFORMANCE STATUS: 1 - Symptomatic but completely ambulatory  Blood pressure (!) 131/41, pulse 68, temperature 98.7 F (37.1 C), temperature source Temporal, resp. rate 18, height 5\' 9"  (1.753 m), weight 209 lb 9.6 oz (95.1 kg), SpO2 96 %.  LABORATORY DATA: Lab Results  Component Value Date   WBC 7.0 04/10/2020   HGB 9.8 (L) 04/10/2020   HCT 31.1 (L) 04/10/2020   MCV 84.1 04/10/2020   PLT 139 (L) 04/10/2020      Chemistry      Component Value Date/Time   NA 139 04/03/2020 0726   K 4.0 04/03/2020 0726   CL 107 04/03/2020 0726   CO2 22 04/03/2020 0726   BUN 22 04/03/2020 0726   CREATININE 0.85 04/03/2020 0726  Component Value Date/Time   CALCIUM 8.6 (L) 04/03/2020 0726   ALKPHOS 98 04/03/2020 0726   AST 16 04/03/2020 0726   ALT 32 04/03/2020 0726   BILITOT 0.5 04/03/2020 0726       RADIOGRAPHIC STUDIES: DG Chest 2 View  Result Date: 03/21/2020 CLINICAL DATA:  Post RIGHT upper lobectomy, shortness of breath EXAM: CHEST - 2 VIEW COMPARISON:  02/22/2020 FINDINGS: Normal heart size and pulmonary vascularity. Postsurgical changes from RIGHT upper lobectomy with hydropneumothorax identified at RIGHT apex, decreased since previous study. Minimal LEFT basilar atelectasis and LEFT apex scarring. Remaining lungs clear. No acute osseous findings. IMPRESSION: Decreased size of loculated hydropneumothorax at RIGHT apex post RIGHT upper lobectomy. Minimal LEFT basilar atelectasis. Electronically Signed   By: Lavonia Dana M.D.   On: 03/21/2020 15:28   DG Abd 2 Views  Result Date: 03/21/2020 CLINICAL DATA:  Abdominal pain.  Right flank pain. EXAM: ABDOMEN - 2 VIEW COMPARISON:  No recent. FINDINGS: Surgical sutures upper abdomen. No bowel distention. Stool noted throughout the colon. Aortoiliac and visceral vascular calcification. 1 cm calcific density noted over the right kidney. This is consistent with  right nephrolithiasis. Pelvic calcifications noted. These could represent phleboliths. Distal ureteral or bladder stones cannot be excluded. Aortoiliac and visceral atherosclerotic vascular calcification. Degenerative change thoracolumbar spine and both hips. IMPRESSION: 1 cm stone noted projected over the right kidney consistent with nephrolithiasis. Pelvic calcifications are noted. These could represent phleboliths. Distal ureteral or bladder stones cannot be excluded. Electronically Signed   By: Marcello Moores  Register   On: 03/21/2020 15:28    ASSESSMENT AND PLAN: This is a very pleasant 74 years old white male recently diagnosed with a stage IIb non-small cell lung cancer with sarcomatoid features in February 2021 status post right upper lobectomy with lymph node dissection under the care of Dr. Servando Snare. The patient is currently undergoing adjuvant systemic chemotherapy with carboplatin for AUC of 6 and paclitaxel 200 mg/M2 every 3 weeks with Neulasta support.  Status post 2 cycles.   The patient continues to tolerate his treatment well with no concerning adverse effects except for fatigue and aching pain after the Neulasta injection. I recommended for the patient to proceed with cycle #3 of his treatment today as planned. I will see him back for follow-up visit in 3 weeks for evaluation before starting cycle #4 and the last one of his treatment. He was advised to call immediately if he has any other concerning symptoms in the interval. The patient voices understanding of current disease status and treatment options and is in agreement with the current care plan.  All questions were answered. The patient knows to call the clinic with any problems, questions or concerns. We can certainly see the patient much sooner if necessary.  Disclaimer: This note was dictated with voice recognition software. Similar sounding words can inadvertently be transcribed and may not be corrected upon review.

## 2020-04-11 ENCOUNTER — Telehealth: Payer: Self-pay | Admitting: Internal Medicine

## 2020-04-11 NOTE — Telephone Encounter (Signed)
Scheduled per los. Called and left msg. Mailed printout  °

## 2020-04-12 ENCOUNTER — Inpatient Hospital Stay: Payer: Medicare Other

## 2020-04-12 ENCOUNTER — Other Ambulatory Visit: Payer: Self-pay

## 2020-04-12 VITALS — BP 129/51 | HR 77 | Temp 99.2°F | Resp 18

## 2020-04-12 DIAGNOSIS — I2510 Atherosclerotic heart disease of native coronary artery without angina pectoris: Secondary | ICD-10-CM | POA: Diagnosis not present

## 2020-04-12 DIAGNOSIS — Z5111 Encounter for antineoplastic chemotherapy: Secondary | ICD-10-CM | POA: Diagnosis not present

## 2020-04-12 DIAGNOSIS — C3491 Malignant neoplasm of unspecified part of right bronchus or lung: Secondary | ICD-10-CM

## 2020-04-12 DIAGNOSIS — E785 Hyperlipidemia, unspecified: Secondary | ICD-10-CM | POA: Diagnosis not present

## 2020-04-12 DIAGNOSIS — Z5189 Encounter for other specified aftercare: Secondary | ICD-10-CM | POA: Diagnosis not present

## 2020-04-12 DIAGNOSIS — C3411 Malignant neoplasm of upper lobe, right bronchus or lung: Secondary | ICD-10-CM | POA: Diagnosis not present

## 2020-04-12 DIAGNOSIS — I10 Essential (primary) hypertension: Secondary | ICD-10-CM | POA: Diagnosis not present

## 2020-04-12 MED ORDER — PEGFILGRASTIM-JMDB 6 MG/0.6ML ~~LOC~~ SOSY
PREFILLED_SYRINGE | SUBCUTANEOUS | Status: AC
  Filled 2020-04-12: qty 0.6

## 2020-04-12 MED ORDER — PEGFILGRASTIM-JMDB 6 MG/0.6ML ~~LOC~~ SOSY
6.0000 mg | PREFILLED_SYRINGE | Freq: Once | SUBCUTANEOUS | Status: AC
  Administered 2020-04-12: 6 mg via SUBCUTANEOUS

## 2020-04-12 NOTE — Patient Instructions (Signed)

## 2020-04-17 ENCOUNTER — Inpatient Hospital Stay: Payer: Medicare Other

## 2020-04-17 ENCOUNTER — Other Ambulatory Visit: Payer: Self-pay

## 2020-04-17 DIAGNOSIS — Z5189 Encounter for other specified aftercare: Secondary | ICD-10-CM | POA: Diagnosis not present

## 2020-04-17 DIAGNOSIS — I10 Essential (primary) hypertension: Secondary | ICD-10-CM | POA: Diagnosis not present

## 2020-04-17 DIAGNOSIS — E785 Hyperlipidemia, unspecified: Secondary | ICD-10-CM | POA: Diagnosis not present

## 2020-04-17 DIAGNOSIS — C3411 Malignant neoplasm of upper lobe, right bronchus or lung: Secondary | ICD-10-CM | POA: Diagnosis not present

## 2020-04-17 DIAGNOSIS — C3490 Malignant neoplasm of unspecified part of unspecified bronchus or lung: Secondary | ICD-10-CM

## 2020-04-17 DIAGNOSIS — I2510 Atherosclerotic heart disease of native coronary artery without angina pectoris: Secondary | ICD-10-CM | POA: Diagnosis not present

## 2020-04-17 DIAGNOSIS — Z5111 Encounter for antineoplastic chemotherapy: Secondary | ICD-10-CM | POA: Diagnosis not present

## 2020-04-17 LAB — CMP (CANCER CENTER ONLY)
ALT: 72 U/L — ABNORMAL HIGH (ref 0–44)
AST: 29 U/L (ref 15–41)
Albumin: 3.6 g/dL (ref 3.5–5.0)
Alkaline Phosphatase: 95 U/L (ref 38–126)
Anion gap: 9 (ref 5–15)
BUN: 22 mg/dL (ref 8–23)
CO2: 22 mmol/L (ref 22–32)
Calcium: 8.6 mg/dL — ABNORMAL LOW (ref 8.9–10.3)
Chloride: 106 mmol/L (ref 98–111)
Creatinine: 0.8 mg/dL (ref 0.61–1.24)
GFR, Est AFR Am: >60 mL/min (ref >60)
GFR, Estimated: >60 mL/min (ref >60)
Glucose, Bld: 109 mg/dL — ABNORMAL HIGH (ref 70–99)
Potassium: 4.3 mmol/L (ref 3.5–5.1)
Sodium: 137 mmol/L (ref 135–145)
Total Bilirubin: 0.3 mg/dL (ref 0.3–1.2)
Total Protein: 6.1 g/dL — ABNORMAL LOW (ref 6.5–8.1)

## 2020-04-17 LAB — CBC WITH DIFFERENTIAL (CANCER CENTER ONLY)
Abs Immature Granulocytes: 0.06 10*3/uL (ref 0.00–0.07)
Basophils Absolute: 0.1 10*3/uL (ref 0.0–0.1)
Basophils Relative: 1 %
Eosinophils Absolute: 0.2 10*3/uL (ref 0.0–0.5)
Eosinophils Relative: 2 %
HCT: 28.8 % — ABNORMAL LOW (ref 39.0–52.0)
Hemoglobin: 9.5 g/dL — ABNORMAL LOW (ref 13.0–17.0)
Immature Granulocytes: 1 %
Lymphocytes Relative: 14 %
Lymphs Abs: 1.4 10*3/uL (ref 0.7–4.0)
MCH: 27.1 pg (ref 26.0–34.0)
MCHC: 33 g/dL (ref 30.0–36.0)
MCV: 82.3 fL (ref 80.0–100.0)
Monocytes Absolute: 1.1 10*3/uL — ABNORMAL HIGH (ref 0.1–1.0)
Monocytes Relative: 11 %
Neutro Abs: 7.4 10*3/uL (ref 1.7–7.7)
Neutrophils Relative %: 71 %
Platelet Count: 156 10*3/uL (ref 150–400)
RBC: 3.5 MIL/uL — ABNORMAL LOW (ref 4.22–5.81)
RDW: 18.6 % — ABNORMAL HIGH (ref 11.5–15.5)
WBC Count: 10.1 10*3/uL (ref 4.0–10.5)
nRBC: 0 % (ref 0.0–0.2)

## 2020-04-24 ENCOUNTER — Other Ambulatory Visit: Payer: Self-pay | Admitting: Internal Medicine

## 2020-04-24 ENCOUNTER — Other Ambulatory Visit: Payer: Self-pay

## 2020-04-24 ENCOUNTER — Inpatient Hospital Stay: Payer: Medicare Other | Attending: Internal Medicine

## 2020-04-24 DIAGNOSIS — Z87891 Personal history of nicotine dependence: Secondary | ICD-10-CM | POA: Insufficient documentation

## 2020-04-24 DIAGNOSIS — R0602 Shortness of breath: Secondary | ICD-10-CM | POA: Insufficient documentation

## 2020-04-24 DIAGNOSIS — Z5111 Encounter for antineoplastic chemotherapy: Secondary | ICD-10-CM | POA: Diagnosis not present

## 2020-04-24 DIAGNOSIS — M791 Myalgia, unspecified site: Secondary | ICD-10-CM | POA: Diagnosis not present

## 2020-04-24 DIAGNOSIS — Z902 Acquired absence of lung [part of]: Secondary | ICD-10-CM | POA: Insufficient documentation

## 2020-04-24 DIAGNOSIS — M1990 Unspecified osteoarthritis, unspecified site: Secondary | ICD-10-CM | POA: Diagnosis not present

## 2020-04-24 DIAGNOSIS — C3411 Malignant neoplasm of upper lobe, right bronchus or lung: Secondary | ICD-10-CM | POA: Diagnosis not present

## 2020-04-24 DIAGNOSIS — C3490 Malignant neoplasm of unspecified part of unspecified bronchus or lung: Secondary | ICD-10-CM

## 2020-04-24 LAB — CBC WITH DIFFERENTIAL (CANCER CENTER ONLY)
Abs Immature Granulocytes: 0.1 10*3/uL — ABNORMAL HIGH (ref 0.00–0.07)
Basophils Absolute: 0 10*3/uL (ref 0.0–0.1)
Basophils Relative: 0 %
Eosinophils Absolute: 0 10*3/uL (ref 0.0–0.5)
Eosinophils Relative: 1 %
HCT: 31.1 % — ABNORMAL LOW (ref 39.0–52.0)
Hemoglobin: 9.9 g/dL — ABNORMAL LOW (ref 13.0–17.0)
Immature Granulocytes: 1 %
Lymphocytes Relative: 15 %
Lymphs Abs: 1.2 10*3/uL (ref 0.7–4.0)
MCH: 27 pg (ref 26.0–34.0)
MCHC: 31.8 g/dL (ref 30.0–36.0)
MCV: 85 fL (ref 80.0–100.0)
Monocytes Absolute: 0.6 10*3/uL (ref 0.1–1.0)
Monocytes Relative: 7 %
Neutro Abs: 5.9 10*3/uL (ref 1.7–7.7)
Neutrophils Relative %: 76 %
Platelet Count: 136 10*3/uL — ABNORMAL LOW (ref 150–400)
RBC: 3.66 MIL/uL — ABNORMAL LOW (ref 4.22–5.81)
RDW: 19.8 % — ABNORMAL HIGH (ref 11.5–15.5)
WBC Count: 7.8 10*3/uL (ref 4.0–10.5)
nRBC: 0 % (ref 0.0–0.2)

## 2020-04-24 LAB — CMP (CANCER CENTER ONLY)
ALT: 41 U/L (ref 0–44)
AST: 21 U/L (ref 15–41)
Albumin: 3.4 g/dL — ABNORMAL LOW (ref 3.5–5.0)
Alkaline Phosphatase: 87 U/L (ref 38–126)
Anion gap: 10 (ref 5–15)
BUN: 19 mg/dL (ref 8–23)
CO2: 23 mmol/L (ref 22–32)
Calcium: 8.5 mg/dL — ABNORMAL LOW (ref 8.9–10.3)
Chloride: 106 mmol/L (ref 98–111)
Creatinine: 0.8 mg/dL (ref 0.61–1.24)
GFR, Est AFR Am: >60 mL/min (ref >60)
GFR, Estimated: >60 mL/min (ref >60)
Glucose, Bld: 95 mg/dL (ref 70–99)
Potassium: 4.2 mmol/L (ref 3.5–5.1)
Sodium: 139 mmol/L (ref 135–145)
Total Bilirubin: 0.4 mg/dL (ref 0.3–1.2)
Total Protein: 5.9 g/dL — ABNORMAL LOW (ref 6.5–8.1)

## 2020-04-25 NOTE — Progress Notes (Signed)
Pharmacist Chemotherapy Monitoring - Follow Up Assessment    I verify that I have reviewed each item in the below checklist:  . Regimen for the patient is scheduled for the appropriate day and plan matches scheduled date. Marland Kitchen Appropriate non-routine labs are ordered dependent on drug ordered. . If applicable, additional medications reviewed and ordered per protocol based on lifetime cumulative doses and/or treatment regimen.   Plan for follow-up and/or issues identified: No . I-vent associated with next due treatment: No . MD and/or nursing notified: No  Delane Ginger 04/25/2020 9:07 AM

## 2020-04-29 DIAGNOSIS — N3281 Overactive bladder: Secondary | ICD-10-CM | POA: Diagnosis not present

## 2020-04-29 DIAGNOSIS — C3411 Malignant neoplasm of upper lobe, right bronchus or lung: Secondary | ICD-10-CM | POA: Diagnosis not present

## 2020-04-29 DIAGNOSIS — R0609 Other forms of dyspnea: Secondary | ICD-10-CM | POA: Diagnosis not present

## 2020-04-29 DIAGNOSIS — I10 Essential (primary) hypertension: Secondary | ICD-10-CM | POA: Diagnosis not present

## 2020-04-29 DIAGNOSIS — E669 Obesity, unspecified: Secondary | ICD-10-CM | POA: Diagnosis not present

## 2020-04-29 DIAGNOSIS — J439 Emphysema, unspecified: Secondary | ICD-10-CM | POA: Diagnosis not present

## 2020-04-29 DIAGNOSIS — Z9861 Coronary angioplasty status: Secondary | ICD-10-CM | POA: Diagnosis not present

## 2020-04-29 DIAGNOSIS — E785 Hyperlipidemia, unspecified: Secondary | ICD-10-CM | POA: Diagnosis not present

## 2020-04-29 DIAGNOSIS — R972 Elevated prostate specific antigen [PSA]: Secondary | ICD-10-CM | POA: Diagnosis not present

## 2020-04-30 NOTE — Progress Notes (Signed)
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 Servando Snare  CURRENT THERAPY: Adjuvant systemic chemotherapy with carboplatin for AUC of 6 and paclitaxel 200 mg/M2 every 3 weeks with Neulasta support. First dose February 28, 2020. Status post 3 cycles.   INTERVAL HISTORY: Daniel Colon 74 y.o. male returns to clinic today for a follow-up visit accompanied by his wife.  The patient is feeling well today without any concerning complaints except for progressively worsening shortness of breath with exertion. The patient saw his PCP for this concern a few days ago who prescribed an inhaler. The patient and his wife were inquiring about pulmonary rehab. The patient denies any associated calf pain/swelling, chest pain, cough, hemoptysis, sore throat, fevers, or nasal congestion. The patient has been tolerating his adjuvant chemotherapy well except for arthralgias and myalgias secondary to his Neulasta injection.  Today, the patient denies any recent chills, night sweats, or weight loss.  He denies any nausea, vomiting, diarrhea, or constipation.  He denies any headache or visual changes.  He denies any new peripheral neuropathy.  The patient is here today for evaluation before starting his last cycle of adjuvant chemotherapy with carboplatin and paclitaxel with cycle #4.  MEDICAL HISTORY: Past Medical History:  Diagnosis Date  . Allergy   . Arthritis   . Barrett's esophagus   . CAD (coronary artery disease)    a. Stent to the [facility] (in [place]);  b. ETT(11/13/13): abnormal with early ST changes to suggest ischemia;   c. LHC (11/17/13):  pLAD 30-40, D1 30, mLAD 70-75, mCFX 70, mRCA stent 40 (ISR), EF 55-65%.  Medical Rx  . Cataract     left eye removed, right immature  . Colon polyps   . Dyslipidemia   . GERD (gastroesophageal reflux disease)   . Hypertension   . Myocardial infarction ([place]) 1996  . Nephrolithiasis    kidney stone  . PUD (peptic ulcer disease)     ALLERGIES:  has No Known Allergies.  MEDICATIONS:  Current Outpatient Medications  Medication Sig Dispense Refill  . acetaminophen (TYLENOL) 500 MG tablet Take 500-1,000 mg by mouth every 6 (six) hours as needed (for pain.).    Marland Kitchen aspirin EC 81 MG tablet Take 81 mg by mouth 3 (three) times a week. In the morning    . atorvastatin (LIPITOR) 40 MG tablet TAKE 1 TABLET DAILY (Patient taking differently: Take 40 mg by mouth daily. ) 90 tablet 3  . BUDESONIDE-FORMOTEROL FUMARATE IN Inhale into the lungs.    . carvedilol (COREG) 12.5 MG tablet Take 12.5 mg by mouth daily.     . Coenzyme Q10 (CO Q-10) 100 MG CAPS Take 100 mg by mouth daily.     Marland Kitchen docusate sodium (COLACE) 100 MG capsule Take 100 mg by mouth daily as needed for mild constipation.    Marland Kitchen ezetimibe (ZETIA) 10 MG tablet Take 10 mg by mouth daily.    . fish oil-omega-3 fatty acids 1000 MG capsule Take 1,000 mg by mouth daily.     Marland Kitchen ipratropium (ATROVENT) 0.03 % nasal spray Place 1 spray into both nostrils daily.     . Multiple Vitamin (MULTIVITAMIN WITH MINERALS) TABS tablet Take 1 tablet by mouth daily.    Marland Kitchen omeprazole (PRILOSEC)  10 MG capsule Take 10 mg by mouth daily.    Marland Kitchen oxybutynin (DITROPAN) 5 MG tablet Take 5 mg by mouth daily.     Marland Kitchen senna (SENOKOT) 176 MG/5ML SYRP Take by mouth.    . Sennosides (SENNA LAXATIVE) 25 MG TABS Take 1 tablet by mouth daily.    Marland Kitchen telmisartan-hydrochlorothiazide (MICARDIS HCT) 80-12.5 MG tablet Take 1 tablet by mouth daily. 90 tablet 3  . guaiFENesin (MUCINEX) 600 MG 12 hr tablet Take 1 tablet (600 mg total) by mouth 2 (two) times daily as needed for cough or to loosen phlegm. (Patient not taking: Reported on 05/01/2020)    . nitroGLYCERIN (NITROLINGUAL) 0.4 MG/SPRAY  spray USE 1 SPRAY ON OR UNDER THE TONGUE EVERY 5 MINUTES FOR 3 DOSES AS NEEDED FOR CHEST PAIN AS DIRECTED BY PRESCRIBER OR PACKAGE INSTRUCTIONS (Patient not taking: Reported on 05/01/2020) 24 g 3  . prochlorperazine (COMPAZINE) 10 MG tablet Take 1 tablet (10 mg total) by mouth every 6 (six) hours as needed for nausea or vomiting. (Patient not taking: Reported on 05/01/2020) 30 tablet 0  . traMADol (ULTRAM) 50 MG tablet Take 1 tablet (50 mg total) by mouth every 6 (six) hours as needed (mild pain). (Patient not taking: Reported on 05/01/2020) 20 tablet 0   No current facility-administered medications for this visit.    SURGICAL HISTORY:  Past Surgical History:  Procedure Laterality Date  . ANGIOPLASTY     stent - rt cor. art  . BILROTH II PROCEDURE    . COLONOSCOPY    . INTERCOSTAL NERVE BLOCK Right 01/24/2020   Procedure: Intercostal Nerve Block;  Surgeon: Grace Isaac, MD;  Location: [facility];  Service: Thoracic;  Laterality: Right;  . LEFT HEART CATHETERIZATION WITH CORONARY ANGIOGRAM N/A 11/17/2013   Procedure: LEFT HEART CATHETERIZATION WITH CORONARY ANGIOGRAM;  Surgeon: Blane Ohara, MD;  Location[facility] CATH LAB;  Service: Cardiovascular;  Laterality: N/A;  . LYMPH NODE DISSECTION N/A 01/24/2020   Procedure: LYMPH NODE DISSECTION;  Surgeon: Grace Isaac, MD;  Location: [facility];  Service: Thoracic;  Laterality: N/A;  . POLYPECTOMY    . ulcer surgery    . ULNAR NERVE TRANSPOSITION Right 08/05/2017   Procedure: RIGHT ULNAR NERVE DECOMPRESSION;  Surgeon: Leanora Cover, MD;  Location: [facility];  Service: Orthopedics;  Laterality: Right;  Marland Kitchen VIDEO BRONCHOSCOPY N/A 01/24/2020   Procedure: VIDEO BRONCHOSCOPY;  Surgeon: Grace Isaac, MD;  Location: [facility] OR;  Service: Thoracic;  Laterality: N/A;    REVIEW OF SYSTEMS:   Review of Systems  Constitutional: Negative for appetite change, chills, fatigue, fever and unexpected weight change.  HENT:   Negative for mouth sores,  nosebleeds, sore throat and trouble swallowing.   Eyes: Negative for eye problems and icterus.  Respiratory: Positive for dyspnea on exertion. Negative for cough, hemoptysis, and wheezing.  Cardiovascular: Negative for chest pain and leg swelling.  Gastrointestinal: Negative for abdominal pain, constipation, diarrhea, nausea and vomiting.  Genitourinary: Negative for bladder incontinence, difficulty urinating, dysuria, frequency and hematuria.   Musculoskeletal: Negative for back pain, gait problem, neck pain and neck stiffness.  Skin: Negative for itching and rash.  Neurological: Negative for dizziness, extremity weakness, gait problem, headaches, light-headedness and seizures.  Hematological: Negative for adenopathy. Does not bruise/bleed easily.  Psychiatric/Behavioral: Negative for confusion, depression and sleep disturbance. The patient is not nervous/anxious.     PHYSICAL EXAMINATION:  Blood pressure (!) 143/68, pulse 75, temperature 98.7 F (37.1 C), temperature source Temporal, resp. rate  17, height 5\' 9"  (1.753 m), weight 205 lb 9.6 oz (93.3 kg), SpO2 99 %.  ECOG PERFORMANCE STATUS: 1 - Symptomatic but completely ambulatory  Physical Exam  Constitutional: Oriented to person, place, and time and well-developed, well-nourished, and in no distress.  HENT:  Head: Normocephalic and atraumatic.  Mouth/Throat: Oropharynx is clear and moist. No oropharyngeal exudate.  Eyes: Conjunctivae are normal. Right eye exhibits no discharge. Left eye exhibits no discharge. No scleral icterus.  Neck: Normal range of motion. Neck supple.  Cardiovascular: Normal rate, regular rhythm, normal heart sounds and intact distal pulses.   Pulmonary/Chest: Effort normal and breath sounds normal. No respiratory distress. No wheezes. No rales.  Abdominal: Soft. Bowel sounds are normal. Exhibits no distension and no mass. There is no tenderness.  Musculoskeletal: Normal range of motion. Exhibits no edema. No  calf pain or swelling.  Lymphadenopathy:    No cervical adenopathy.  Neurological: Alert and oriented to person, place, and time. Exhibits normal muscle tone. Gait normal. Coordination normal.  Skin: Skin is warm and dry. No rash noted. Not diaphoretic. No erythema. No pallor.  Psychiatric: Mood, memory and judgment normal.  Vitals reviewed.  LABORATORY DATA: Lab Results  Component Value Date   WBC 7.6 05/01/2020   HGB 10.4 (L) 05/01/2020   HCT 31.7 (L) 05/01/2020   MCV 85.2 05/01/2020   PLT 162 05/01/2020      Chemistry      Component Value Date/Time   NA 139 05/01/2020 0829   K 4.2 05/01/2020 0829   CL 109 05/01/2020 0829   CO2 19 (L) 05/01/2020 0829   BUN 27 (H) 05/01/2020 0829   CREATININE 0.94 05/01/2020 0829      Component Value Date/Time   CALCIUM 8.5 (L) 05/01/2020 0829   ALKPHOS 83 05/01/2020 0829   AST 17 05/01/2020 0829   ALT 27 05/01/2020 0829   BILITOT 0.5 05/01/2020 0829       RADIOGRAPHIC STUDIES:  No results found.   ASSESSMENT/PLAN:  This is a very pleasant 74 year old Caucasian male diagnosed with stage IIb carcinoma with sarcomatoid features in February 2021.  He had a right upper lobectomy with lymph node dissection under the care of Dr. Servando Snare.   The patient is currently undergoing adjuvant systemic chemotherapy with carboplatin for an AUC of 6 and paclitaxel 200 mg/m IV every 3 weeks with Neulasta support.  He is status post 3 cycles and he tolerated it well except for some fatigue with his Neulasta injection.     Labs were reviewed.  We recommend that he proceed with his last cycle of adjuvant chemotherapy, cycle #4 today scheduled.  I will arrange for the patient to have his next restaging CT scan performed in 1 month  We will see him back for follow-up visit in 1 month for evaluation and to review his scan results.  Regarding pulmonary rehab, discussed with Dr. Julien Nordmann who informed me that the patient should be directed to his  pulmonologist for a referral/prescription for pulmonary rehab. The patient is established with pulmonology and will reach out to their office.   The patient was advised to call immediately if he has any concerning symptoms in the interval. The patient voices understanding of current disease status and treatment options and is in agreement with the current care plan. All questions were answered. The patient knows to call the clinic with any problems, questions or concerns. We can certainly see the patient much sooner if necessary   Orders Placed This Encounter  Procedures  . CT Chest W Contrast    Standing Status:   Future    Standing Expiration Date:   05/01/2021    Order Specific Question:   ** REASON FOR EXAM (FREE TEXT)    Answer:   Restaging Lung Cancer    Order Specific Question:   If indicated for the ordered procedure, I authorize the administration of contrast media per Radiology protocol    Answer:   Yes    Order Specific Question:   Preferred imaging location?    Answer:   [facility]    Order Specific Question:   Radiology Contrast Protocol - do NOT remove file path    Answer:   [email or web address]     Verona Walk, PA-C 05/01/20

## 2020-05-01 ENCOUNTER — Inpatient Hospital Stay (HOSPITAL_BASED_OUTPATIENT_CLINIC_OR_DEPARTMENT_OTHER): Payer: Medicare Other | Admitting: Physician Assistant

## 2020-05-01 ENCOUNTER — Inpatient Hospital Stay: Payer: Medicare Other

## 2020-05-01 ENCOUNTER — Other Ambulatory Visit: Payer: Self-pay

## 2020-05-01 VITALS — BP 143/68 | HR 75 | Temp 98.7°F | Resp 17 | Ht 69.0 in | Wt 205.6 lb

## 2020-05-01 DIAGNOSIS — C3490 Malignant neoplasm of unspecified part of unspecified bronchus or lung: Secondary | ICD-10-CM

## 2020-05-01 DIAGNOSIS — C3411 Malignant neoplasm of upper lobe, right bronchus or lung: Secondary | ICD-10-CM

## 2020-05-01 DIAGNOSIS — R0602 Shortness of breath: Secondary | ICD-10-CM | POA: Diagnosis not present

## 2020-05-01 DIAGNOSIS — M1990 Unspecified osteoarthritis, unspecified site: Secondary | ICD-10-CM | POA: Diagnosis not present

## 2020-05-01 DIAGNOSIS — M791 Myalgia, unspecified site: Secondary | ICD-10-CM | POA: Diagnosis not present

## 2020-05-01 DIAGNOSIS — Z5111 Encounter for antineoplastic chemotherapy: Secondary | ICD-10-CM | POA: Diagnosis not present

## 2020-05-01 DIAGNOSIS — C3491 Malignant neoplasm of unspecified part of right bronchus or lung: Secondary | ICD-10-CM

## 2020-05-01 DIAGNOSIS — Z902 Acquired absence of lung [part of]: Secondary | ICD-10-CM | POA: Diagnosis not present

## 2020-05-01 LAB — CMP (CANCER CENTER ONLY)
ALT: 27 U/L (ref 0–44)
AST: 17 U/L (ref 15–41)
Albumin: 3.6 g/dL (ref 3.5–5.0)
Alkaline Phosphatase: 83 U/L (ref 38–126)
Anion gap: 11 (ref 5–15)
BUN: 27 mg/dL — ABNORMAL HIGH (ref 8–23)
CO2: 19 mmol/L — ABNORMAL LOW (ref 22–32)
Calcium: 8.5 mg/dL — ABNORMAL LOW (ref 8.9–10.3)
Chloride: 109 mmol/L (ref 98–111)
Creatinine: 0.94 mg/dL (ref 0.61–1.24)
GFR, Est AFR Am: >60 mL/min (ref >60)
GFR, Estimated: >60 mL/min (ref >60)
Glucose, Bld: 96 mg/dL (ref 70–99)
Potassium: 4.2 mmol/L (ref 3.5–5.1)
Sodium: 139 mmol/L (ref 135–145)
Total Bilirubin: 0.5 mg/dL (ref 0.3–1.2)
Total Protein: 6.6 g/dL (ref 6.5–8.1)

## 2020-05-01 LAB — CBC WITH DIFFERENTIAL (CANCER CENTER ONLY)
Abs Immature Granulocytes: 0.04 10*3/uL (ref 0.00–0.07)
Basophils Absolute: 0 10*3/uL (ref 0.0–0.1)
Basophils Relative: 1 %
Eosinophils Absolute: 0 10*3/uL (ref 0.0–0.5)
Eosinophils Relative: 0 %
HCT: 31.7 % — ABNORMAL LOW (ref 39.0–52.0)
Hemoglobin: 10.4 g/dL — ABNORMAL LOW (ref 13.0–17.0)
Immature Granulocytes: 1 %
Lymphocytes Relative: 13 %
Lymphs Abs: 1 10*3/uL (ref 0.7–4.0)
MCH: 28 pg (ref 26.0–34.0)
MCHC: 32.8 g/dL (ref 30.0–36.0)
MCV: 85.2 fL (ref 80.0–100.0)
Monocytes Absolute: 0.7 10*3/uL (ref 0.1–1.0)
Monocytes Relative: 9 %
Neutro Abs: 5.8 10*3/uL (ref 1.7–7.7)
Neutrophils Relative %: 76 %
Platelet Count: 162 10*3/uL (ref 150–400)
RBC: 3.72 MIL/uL — ABNORMAL LOW (ref 4.22–5.81)
RDW: 20 % — ABNORMAL HIGH (ref 11.5–15.5)
WBC Count: 7.6 10*3/uL (ref 4.0–10.5)
nRBC: 0 % (ref 0.0–0.2)

## 2020-05-01 MED ORDER — FAMOTIDINE IN NACL 20-0.9 MG/50ML-% IV SOLN
INTRAVENOUS | Status: AC
  Filled 2020-05-01: qty 50

## 2020-05-01 MED ORDER — SODIUM CHLORIDE 0.9 % IV SOLN
640.0000 mg | Freq: Once | INTRAVENOUS | Status: AC
  Administered 2020-05-01: 640 mg via INTRAVENOUS
  Filled 2020-05-01: qty 64

## 2020-05-01 MED ORDER — FAMOTIDINE IN NACL 20-0.9 MG/50ML-% IV SOLN
20.0000 mg | Freq: Once | INTRAVENOUS | Status: AC
  Administered 2020-05-01: 20 mg via INTRAVENOUS

## 2020-05-01 MED ORDER — PALONOSETRON HCL INJECTION 0.25 MG/5ML
INTRAVENOUS | Status: AC
  Filled 2020-05-01: qty 5

## 2020-05-01 MED ORDER — SODIUM CHLORIDE 0.9 % IV SOLN
200.0000 mg/m2 | Freq: Once | INTRAVENOUS | Status: AC
  Administered 2020-05-01: 426 mg via INTRAVENOUS
  Filled 2020-05-01: qty 71

## 2020-05-01 MED ORDER — DIPHENHYDRAMINE HCL 50 MG/ML IJ SOLN
INTRAMUSCULAR | Status: AC
  Filled 2020-05-01: qty 1

## 2020-05-01 MED ORDER — DIPHENHYDRAMINE HCL 50 MG/ML IJ SOLN
50.0000 mg | Freq: Once | INTRAMUSCULAR | Status: AC
  Administered 2020-05-01: 50 mg via INTRAVENOUS

## 2020-05-01 MED ORDER — SODIUM CHLORIDE 0.9 % IV SOLN
10.0000 mg | Freq: Once | INTRAVENOUS | Status: AC
  Administered 2020-05-01: 10 mg via INTRAVENOUS
  Filled 2020-05-01: qty 10

## 2020-05-01 MED ORDER — SODIUM CHLORIDE 0.9 % IV SOLN
150.0000 mg | Freq: Once | INTRAVENOUS | Status: AC
  Administered 2020-05-01: 150 mg via INTRAVENOUS
  Filled 2020-05-01: qty 150

## 2020-05-01 MED ORDER — SODIUM CHLORIDE 0.9 % IV SOLN
Freq: Once | INTRAVENOUS | Status: AC
  Filled 2020-05-01: qty 250

## 2020-05-01 MED ORDER — PALONOSETRON HCL INJECTION 0.25 MG/5ML
0.2500 mg | Freq: Once | INTRAVENOUS | Status: AC
  Administered 2020-05-01: 0.25 mg via INTRAVENOUS

## 2020-05-01 NOTE — Patient Instructions (Signed)
[facility] Discharge Instructions for Patients Receiving Chemotherapy  Today you received the following chemotherapy agents: Paclitaxel and Carboplatin  To help prevent nausea and vomiting after your treatment, we encourage you to take your nausea medication as directed by your MD.   If you develop nausea and vomiting that is not controlled by your nausea medication, call the clinic.   BELOW ARE SYMPTOMS THAT SHOULD BE REPORTED IMMEDIATELY:  *FEVER GREATER THAN 100.5 F  *CHILLS WITH OR WITHOUT FEVER  NAUSEA AND VOMITING THAT IS NOT CONTROLLED WITH YOUR NAUSEA MEDICATION  *UNUSUAL SHORTNESS OF BREATH  *UNUSUAL BRUISING OR BLEEDING  TENDERNESS IN MOUTH AND THROAT WITH OR WITHOUT PRESENCE OF ULCERS  *URINARY PROBLEMS  *BOWEL PROBLEMS  UNUSUAL RASH Items with * indicate a potential emergency and should be followed up as soon as possible.  Feel free to call the clinic should you have any questions or concerns. The clinic phone number is [phone].  Please show the [facility] at check-in to the Emergency Department and triage nurse. Coronavirus (COVID-19) Are you at risk?  Are you at risk for the Coronavirus (COVID-19)?  To be considered HIGH RISK for Coronavirus (COVID-19), you have to meet the following criteria:  . Traveled to [place], [place], [place], [place] or [place]; or in the [place] to [place], [place], [place], or [place]; and have fever, cough, and shortness of breath within the last 2 weeks of travel OR . Been in close contact with a person diagnosed with COVID-19 within the last 2 weeks and have fever, cough, and shortness of breath . IF YOU DO NOT MEET THESE CRITERIA, YOU ARE CONSIDERED LOW RISK FOR COVID-19.  What to do if you are HIGH RISK for COVID-19?  Marland Kitchen If you are having a medical emergency, call 911. . Seek medical care right away. Before you go to a doctor's office, urgent care or emergency department, call ahead and  tell them about your recent travel, contact with someone diagnosed with COVID-19, and your symptoms. You should receive instructions from your physician's office regarding next steps of care.  . When you arrive at healthcare provider, tell the healthcare staff immediately you have returned from visiting [place], [place], [place], [place] or [place]; or traveled in the [place] to [place], [place], [place], or [place]; in the last two weeks or you have been in close contact with a person diagnosed with COVID-19 in the last 2 weeks.   . Tell the health care staff about your symptoms: fever, cough and shortness of breath. . After you have been seen by a medical provider, you will be either: o Tested for (COVID-19) and discharged home on quarantine except to seek medical care if symptoms worsen, and asked to  - Stay home and avoid contact with others until you get your results (4-5 days)  - Avoid travel on public transportation if possible (such as bus, train, or airplane) or o Sent to the Emergency Department by EMS for evaluation, COVID-19 testing, and possible admission depending on your condition and test results.  What to do if you are LOW RISK for COVID-19?  Reduce your risk of any infection by using the same precautions used for avoiding the common cold or flu:  Marland Kitchen Wash your hands often with soap and warm water for at least 20 seconds.  If soap and water are not readily available, use an alcohol-based hand sanitizer with at least 60% alcohol.  Marland Kitchen  If coughing or sneezing, cover your mouth and nose by coughing or sneezing into the elbow areas of your shirt or coat, into a tissue or into your sleeve (not your hands). . Avoid shaking hands with others and consider head nods or verbal greetings only. . Avoid touching your eyes, nose, or mouth with unwashed hands.  . Avoid close contact with people who are sick. . Avoid places or events with large numbers of people in one location, like  concerts or sporting events. . Carefully consider travel plans you have or are making. . If you are planning any travel outside or inside the [place], visit the CDC's Travelers' Health webpage for the latest health notices. . If you have some symptoms but not all symptoms, continue to monitor at home and seek medical attention if your symptoms worsen. . If you are having a medical emergency, call 911.   [facility] / e-Visit: [email or web address]         [facility]: [facility]: [phone]                   [facility]: [phone]

## 2020-05-02 ENCOUNTER — Telehealth: Payer: Self-pay | Admitting: Physician Assistant

## 2020-05-02 ENCOUNTER — Encounter: Payer: Medicare Other | Admitting: Cardiothoracic Surgery

## 2020-05-02 NOTE — Telephone Encounter (Signed)
Scheduled per los. Called and left msg. Mailed printout  °

## 2020-05-03 ENCOUNTER — Inpatient Hospital Stay: Payer: Medicare Other

## 2020-05-03 ENCOUNTER — Other Ambulatory Visit: Payer: Self-pay

## 2020-05-03 VITALS — BP 138/72 | HR 72 | Temp 98.2°F | Resp 18

## 2020-05-03 DIAGNOSIS — Z902 Acquired absence of lung [part of]: Secondary | ICD-10-CM | POA: Diagnosis not present

## 2020-05-03 DIAGNOSIS — Z5111 Encounter for antineoplastic chemotherapy: Secondary | ICD-10-CM | POA: Diagnosis not present

## 2020-05-03 DIAGNOSIS — M1990 Unspecified osteoarthritis, unspecified site: Secondary | ICD-10-CM | POA: Diagnosis not present

## 2020-05-03 DIAGNOSIS — M791 Myalgia, unspecified site: Secondary | ICD-10-CM | POA: Diagnosis not present

## 2020-05-03 DIAGNOSIS — R0602 Shortness of breath: Secondary | ICD-10-CM | POA: Diagnosis not present

## 2020-05-03 DIAGNOSIS — C3491 Malignant neoplasm of unspecified part of right bronchus or lung: Secondary | ICD-10-CM

## 2020-05-03 DIAGNOSIS — C3411 Malignant neoplasm of upper lobe, right bronchus or lung: Secondary | ICD-10-CM | POA: Diagnosis not present

## 2020-05-03 MED ORDER — PEGFILGRASTIM-JMDB 6 MG/0.6ML ~~LOC~~ SOSY
PREFILLED_SYRINGE | SUBCUTANEOUS | Status: AC
  Filled 2020-05-03: qty 0.6

## 2020-05-03 MED ORDER — PEGFILGRASTIM-JMDB 6 MG/0.6ML ~~LOC~~ SOSY
6.0000 mg | PREFILLED_SYRINGE | Freq: Once | SUBCUTANEOUS | Status: AC
  Administered 2020-05-03: 6 mg via SUBCUTANEOUS

## 2020-05-03 NOTE — Patient Instructions (Signed)

## 2020-05-03 NOTE — Telephone Encounter (Signed)
Daniel Colon which diagnosis. I don't thing lunch nodule or abnormal CT will work for pulmonary rehab.

## 2020-05-08 ENCOUNTER — Inpatient Hospital Stay: Payer: Medicare Other

## 2020-05-08 ENCOUNTER — Other Ambulatory Visit: Payer: Self-pay | Admitting: Cardiothoracic Surgery

## 2020-05-08 ENCOUNTER — Other Ambulatory Visit: Payer: Self-pay

## 2020-05-08 DIAGNOSIS — Z902 Acquired absence of lung [part of]: Secondary | ICD-10-CM | POA: Diagnosis not present

## 2020-05-08 DIAGNOSIS — R0602 Shortness of breath: Secondary | ICD-10-CM | POA: Diagnosis not present

## 2020-05-08 DIAGNOSIS — C3490 Malignant neoplasm of unspecified part of unspecified bronchus or lung: Secondary | ICD-10-CM

## 2020-05-08 DIAGNOSIS — M791 Myalgia, unspecified site: Secondary | ICD-10-CM | POA: Diagnosis not present

## 2020-05-08 DIAGNOSIS — M1990 Unspecified osteoarthritis, unspecified site: Secondary | ICD-10-CM | POA: Diagnosis not present

## 2020-05-08 DIAGNOSIS — C3411 Malignant neoplasm of upper lobe, right bronchus or lung: Secondary | ICD-10-CM | POA: Diagnosis not present

## 2020-05-08 DIAGNOSIS — Z5111 Encounter for antineoplastic chemotherapy: Secondary | ICD-10-CM | POA: Diagnosis not present

## 2020-05-08 LAB — CBC WITH DIFFERENTIAL (CANCER CENTER ONLY)
Abs Immature Granulocytes: 0.1 10*3/uL — ABNORMAL HIGH (ref 0.00–0.07)
Basophils Absolute: 0.1 10*3/uL (ref 0.0–0.1)
Basophils Relative: 1 %
Eosinophils Absolute: 0.2 10*3/uL (ref 0.0–0.5)
Eosinophils Relative: 2 %
HCT: 29.7 % — ABNORMAL LOW (ref 39.0–52.0)
Hemoglobin: 9.5 g/dL — ABNORMAL LOW (ref 13.0–17.0)
Immature Granulocytes: 1 %
Lymphocytes Relative: 15 %
Lymphs Abs: 1.6 10*3/uL (ref 0.7–4.0)
MCH: 27.4 pg (ref 26.0–34.0)
MCHC: 32 g/dL (ref 30.0–36.0)
MCV: 85.6 fL (ref 80.0–100.0)
Monocytes Absolute: 1.2 10*3/uL — ABNORMAL HIGH (ref 0.1–1.0)
Monocytes Relative: 11 %
Neutro Abs: 7.3 10*3/uL (ref 1.7–7.7)
Neutrophils Relative %: 70 %
Platelet Count: 148 10*3/uL — ABNORMAL LOW (ref 150–400)
RBC: 3.47 MIL/uL — ABNORMAL LOW (ref 4.22–5.81)
RDW: 19.2 % — ABNORMAL HIGH (ref 11.5–15.5)
WBC Count: 10.5 10*3/uL (ref 4.0–10.5)
nRBC: 0.3 % — ABNORMAL HIGH (ref 0.0–0.2)

## 2020-05-08 LAB — CMP (CANCER CENTER ONLY)
ALT: 79 U/L — ABNORMAL HIGH (ref 0–44)
AST: 37 U/L (ref 15–41)
Albumin: 3.6 g/dL (ref 3.5–5.0)
Alkaline Phosphatase: 97 U/L (ref 38–126)
Anion gap: 10 (ref 5–15)
BUN: 22 mg/dL (ref 8–23)
CO2: 22 mmol/L (ref 22–32)
Calcium: 8.5 mg/dL — ABNORMAL LOW (ref 8.9–10.3)
Chloride: 106 mmol/L (ref 98–111)
Creatinine: 0.87 mg/dL (ref 0.61–1.24)
GFR, Est AFR Am: >60 mL/min (ref >60)
GFR, Estimated: >60 mL/min (ref >60)
Glucose, Bld: 114 mg/dL — ABNORMAL HIGH (ref 70–99)
Potassium: 4.1 mmol/L (ref 3.5–5.1)
Sodium: 138 mmol/L (ref 135–145)
Total Bilirubin: 0.2 mg/dL — ABNORMAL LOW (ref 0.3–1.2)
Total Protein: 6.2 g/dL — ABNORMAL LOW (ref 6.5–8.1)

## 2020-05-09 ENCOUNTER — Other Ambulatory Visit: Payer: Self-pay

## 2020-05-09 ENCOUNTER — Encounter: Payer: Self-pay | Admitting: Cardiothoracic Surgery

## 2020-05-09 ENCOUNTER — Ambulatory Visit (INDEPENDENT_AMBULATORY_CARE_PROVIDER_SITE_OTHER): Payer: Medicare Other | Admitting: Cardiothoracic Surgery

## 2020-05-09 ENCOUNTER — Ambulatory Visit
Admission: RE | Admit: 2020-05-09 | Discharge: 2020-05-09 | Disposition: A | Discharge status: Home / Self Care | Payer: Medicare Other | Source: Ambulatory Visit | Attending: Cardiothoracic Surgery | Admitting: Cardiothoracic Surgery

## 2020-05-09 VITALS — BP 142/63 | HR 67 | Temp 98.8°F | Resp 18 | Ht 69.0 in | Wt 200.0 lb

## 2020-05-09 DIAGNOSIS — Z902 Acquired absence of lung [part of]: Secondary | ICD-10-CM

## 2020-05-09 DIAGNOSIS — Z85118 Personal history of other malignant neoplasm of bronchus and lung: Secondary | ICD-10-CM | POA: Diagnosis not present

## 2020-05-09 DIAGNOSIS — C3490 Malignant neoplasm of unspecified part of unspecified bronchus or lung: Secondary | ICD-10-CM | POA: Diagnosis not present

## 2020-05-09 NOTE — Progress Notes (Signed)
[address]       [place] [address]             [phone]                  Chibueze Lee Stoney [facility]pp Medical Record #[id number] Date of Birth: 1946/06/09  Referring EX:HBZJI, Lars Masson, NP Primary Cardiology: Primary Care:[place], Quillian Quince, MD  Chief Complaint:  Follow Up Visit OPERATIVE REPORT DATE OF PROCEDURE:  01/24/2020 PREOPERATIVE DIAGNOSIS:  A 2 cm right upper lobe lung mass found on surveillance lung cancer screening CT. POSTOPERATIVE DIAGNOSES:   1.  Malignant neoplasm, right upper lobe, 2.  Sarcomatous changes on frozen section. PROCEDURES PERFORMED:   1.  Bronchoscopy.   2.  Robotic-assisted wedge resection- right upper lobe with completion right upper lobe lobectomy, lymph node dissection, and intercostal nerve block. SURGEON:  Lanelle Bal, MD  Cancer Staging Lung cancer [facility]) Staging form: Lung, AJCC 8th Edition - Pathologic stage from 01/29/2020: Stage IIB (pT1c, pN1, cM0) - Signed by Grace Isaac, MD on 01/29/2020  SURGICAL PATHOLOGY  CASE: [id number]  PATIENT: Daniel Colon  Surgical Pathology Report   Clinical History: pulmonary nodule, subpleural nodule in right upper  lobe (cm)   FINAL MICROSCOPIC DIAGNOSIS:   A. LUNG, RIGHT UPPER LOBE, WEDGE RESECTION:  - Poorly differentiated carcinoma with sarcomatoid features, 2.2 cm.  - See oncology table and comment.   B. LYMPH NODE, LEVEL 7, EXCISION:  - Anthracotic lymph node with no metastatic carcinoma.   C. LYMPH NODE, 10R, EXCISION:  - Anthracotic lymph node with no metastatic carcinoma.   D. LYMPH NODE, 10R #2, EXCISION:  - Anthracotic lymph node with no metastatic carcinoma.   E. LYMPH NODE, 12R, EXCISION:  - Anthracotic lymph node with no metastatic carcinoma.   F. LYMPH NODE, 10R #3, EXCISION:  - Anthracotic lymph node with no metastatic carcinoma.   G. LYMPH NODE, 10R #4, EXCISION:  - Anthracotic lymph node with no metastatic carcinoma.   H. LYMPH  NODE, 11R, EXCISION:  - Anthracotic lymph node with no metastatic carcinoma.   I. LYMPH NODE, 11R #2, EXCISION:  - Anthracotic lymph node with no metastatic carcinoma.   J. LYMPH NODE, 11R #3, EXCISION:  - Anthracotic lymph node with no metastatic carcinoma.   K. LYMPH NODE, 11R #4, EXCISION:  - Anthracotic lymph node with no metastatic carcinoma.   L. LUNG, RIGHT UPPER LOBE COMPLETION, LOBECTOMY:  - Findings consistent with previous wedge biopsy.  - Final margins free of tumor.  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   ONCOLOGY TABLE:  LUNG: Resection  Procedure: Right upper lobe wedge biopsy, right upper lobectomy and  lymph node biopsies.  Specimen Laterality: Right  Tumor Site: Right upper lobe.  Tumor Size: 2.2 x 1.5 x 0.8 cm.  Tumor Focality: Unifocal.  Histologic Type: Poorly differentiated carcinoma with f features  (sarcomatoid carcinoma).  Visceral Pleura Invasion: Involves subpleural connective tissue but does  not extend to pleural surface.  Lymphovascular Invasion: Present.  Direct Invasion of Adjacent Structures: No adjacent structures  submitted.  Margins: Free of tumor.  Treatment Effect: No known presurgical therapy.  Regional Lymph Nodes:    Number of Lymph Nodes Involved: 1    Number of Lymph Nodes Examined: 12  Pathologic Stage Classification (pTNM, AJCC 8th Edition): pT1c, pN1  Ancillary Studies: Can be performed if requested.  Representative Tumor Block: A2, A3 and  A4.  Comment(s): The tumor is poorly differentiated with large areas of  diffuse sheets of malignant cells some of which have spindle cell  features with marked nuclear pleomorphism and frequent mitotic figures.  There are areas with epithelioid features with abundant eosinophilic  cytoplasm. Immunohistochemistry shows positivity with cytokeratin  AE1/AE3, cytokeratin 7 and TTF-1. The tumor is negative with  cytokeratin 5/6, p40, p63,  Napsin-A, MOC31, cytokeratin 20, and CDX-2.  The immunophenotype is consistent with poorly differentiated carcinoma  and the morphology shows sarcomatoid features (sarcomatoid carcinoma).   Dr. Tresa Moore has reviewed slides of the tumor and agrees.  (v4.1.0.1)   History of Present Illness:     Patient returns to the office today with a follow-up chest x-ray after recent robotic right upper lobectomy for a poorly differentiated 2 cm right upper lobe mass with single positive hilar lymph node.  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   He has noted paresthesias around the right lower chest and extending to the right upper abdomen.      With the positive hilar lymph node he was referred to medical oncology-He completed  4 cycles of chemotherapy, Adjuvant systemic chemotherapy with carboplatin for AUC of 6 and paclitaxel 200 mg/M2 every 3 weeks with Neulasta support. First dose February 28, 2020.  Patient patient notes increase dyspnea with exertion after completing his course of chemotherapy.  He notes early postop he was able to walk several miles a day without dyspnea.    Zubrod Score: At the time of surgery this patient's most appropriate activity status/level should be described as: []     0    Normal activity, no symptoms [x]     1    Restricted in physical strenuous activity but ambulatory, able to do out light work []     2    Ambulatory and capable of self care, unable to do work activities, up and about                 >50 % of waking hours                                                                                   []     3    Only limited self care, in bed greater than 50% of waking hours []     4    Completely disabled, no self care, confined to bed or chair []     5    Moribund  Social History   Tobacco Use  Smoking Status Former Smoker  . Packs/day: 1.00  . Years: 42.00  . Pack years:  42.00  . Types: Cigarettes  . Quit date: 06/21/2011  . Years since quitting: 8.8  Smokeless Tobacco Current User  . Types: Chew       No Known Allergies  Current Outpatient Medications  Medication Sig Dispense Refill  . acetaminophen (TYLENOL) 500 MG tablet Take 500-1,000 mg by mouth every 6 (six) hours as needed (for pain.).    Marland Kitchen aspirin EC 81 MG tablet Take 81 mg by mouth 3 (three) times a week. In  the morning    . atorvastatin (LIPITOR) 40 MG tablet TAKE 1 TABLET DAILY (Patient taking differently: Take 40 mg by mouth daily. ) 90 tablet 3  . BUDESONIDE-FORMOTEROL FUMARATE IN Inhale into the lungs.    . carvedilol (COREG) 12.5 MG tablet Take 12.5 mg by mouth daily.     . Coenzyme Q10 (CO Q-10) 100 MG CAPS Take 100 mg by mouth daily.     Marland Kitchen docusate sodium (COLACE) 100 MG capsule Take 100 mg by mouth daily as needed for mild constipation.    Marland Kitchen ezetimibe (ZETIA) 10 MG tablet Take 10 mg by mouth daily.    . fish oil-omega-3 fatty acids 1000 MG capsule Take 1,000 mg by mouth daily.     Marland Kitchen guaiFENesin (MUCINEX) 600 MG 12 hr tablet Take 1 tablet (600 mg total) by mouth 2 (two) times daily as needed for cough or to loosen phlegm.    Marland Kitchen ipratropium (ATROVENT) 0.03 % nasal spray Place 1 spray into both nostrils daily.     . Multiple Vitamin (MULTIVITAMIN WITH MINERALS) TABS tablet Take 1 tablet by mouth daily.    . nitroGLYCERIN (NITROLINGUAL) 0.4 MG/SPRAY spray USE 1 SPRAY ON OR UNDER THE TONGUE EVERY 5 MINUTES FOR 3 DOSES AS NEEDED FOR CHEST PAIN AS DIRECTED BY PRESCRIBER OR PACKAGE INSTRUCTIONS 24 g 3  . omeprazole (PRILOSEC) 10 MG capsule Take 10 mg by mouth daily.    Marland Kitchen oxybutynin (DITROPAN) 5 MG tablet Take 5 mg by mouth daily.     . prochlorperazine (COMPAZINE) 10 MG tablet Take 1 tablet (10 mg total) by mouth every 6 (six) hours as needed for nausea or vomiting. 30 tablet 0  . senna (SENOKOT) 176 MG/5ML SYRP Take by mouth.    . Sennosides (SENNA LAXATIVE) 25 MG TABS Take 1 tablet by mouth  daily.    Marland Kitchen telmisartan-hydrochlorothiazide (MICARDIS HCT) 80-12.5 MG tablet Take 1 tablet by mouth daily. 90 tablet 3  . traMADol (ULTRAM) 50 MG tablet Take 1 tablet (50 mg total) by mouth every 6 (six) hours as needed (mild pain). 20 tablet 0   No current facility-administered medications for this visit.       Physical Exam: BP (!) 142/63 (BP Location: Left Arm, Patient Position: Sitting, Cuff Size: Normal)   Pulse 67   Temp 98.8 F (37.1 C)   Resp 18   Ht 5\' 9"  (1.753 m)   Wt 200 lb (90.7 kg)   SpO2 94% Comment: RA  BMI 29.53 kg/m  General appearance: alert and cooperative Head: Normocephalic, without obvious abnormality, atraumatic Neck: no adenopathy, no carotid bruit, no JVD, supple, symmetrical, trachea midline and thyroid not enlarged, symmetric, no tenderness/mass/nodules Resp: clear to auscultation bilaterally GI: soft, non-tender; bowel sounds normal; no masses,  no organomegaly Extremities: extremities normal, atraumatic, no cyanosis or edema and Homans sign is negative, no sign of DVT Neurologic: Grossly normal Some right rectus muscle weakness noted  Diagnostic Studies & Laboratory data:         Recent Radiology Findings: DG Chest 2 View  Result Date: 05/09/2020 CLINICAL DATA:  History of lung cancer EXAM: CHEST - 2 VIEW COMPARISON:  03/21/2020 FINDINGS: Stable cardiomediastinal contours. Postoperative changes from right upper lobectomy with loculated pleural fluid collection at the right lung apex. Interval resolution of the previously seen air component within this collection. Remaining lung fields are otherwise clear. No pneumothorax. IMPRESSION: Postoperative changes from right upper lobectomy with interval resolution of the previously seen air component within the loculated  pleural fluid collection at the right lung apex. Electronically Signed   By: Davina Poke D.O.   On: 05/09/2020 12:33      I have independently reviewed the above radiology findings and  reviewed findings  with the patient.  Recent Labs: Lab Results  Component Value Date   WBC 10.5 05/08/2020   HGB 9.5 (L) 05/08/2020   HCT 29.7 (L) 05/08/2020   PLT 148 (L) 05/08/2020   GLUCOSE 114 (H) 05/08/2020   ALT 79 (H) 05/08/2020   AST 37 05/08/2020   NA 138 05/08/2020   K 4.1 05/08/2020   CL 106 05/08/2020   CREATININE 0.87 05/08/2020   BUN 22 05/08/2020   CO2 22 05/08/2020   INR 1.0 01/22/2020      Assessment / Plan:   Patient stable after robotic right upper lobectomy and lymph node dissection without with what ultimately was found to be poorly differentiated carcinoma with sarcomatous features,-stage IIb.  Now completed adjuvant systemic chemotherapy with carboplatin for AUC of 6 and paclitaxel 200 mg/M2 every 3 weeks with Neulasta support.   The patient would benefit from pulmonary rehab to regain his physical and respiratory strength back to what it had been-referral made to pulmonary rehab  Plan to see me back in 3 months  Medication Changes: No orders of the defined types were placed in this encounter.      Grace Isaac 05/09/2020 12:45 PM

## 2020-05-10 NOTE — Telephone Encounter (Signed)
The patient has never seen a doctor here, only [facility]. Which doctor should I place the order under?

## 2020-05-13 ENCOUNTER — Telehealth (HOSPITAL_COMMUNITY): Payer: Self-pay | Admitting: *Deleted

## 2020-05-13 ENCOUNTER — Encounter (HOSPITAL_COMMUNITY): Payer: Self-pay | Admitting: *Deleted

## 2020-05-13 ENCOUNTER — Telehealth (HOSPITAL_COMMUNITY): Payer: Self-pay

## 2020-05-13 NOTE — Telephone Encounter (Signed)
-----   Message from Gracelyn Nurse, PA-C sent at 05/13/2020 10:41 AM EDT ----- Regarding: RE: Ok for participate in group exercise with Pulmonary Rehab Yes. This patient expressed interest for pulmonary rehab last time I saw him. Thank you so much for seeing him, I know he is looking forward to this. There are no restrictions/precautions from an oncology standpoint that he needs to abide by but I appreciate you reaching out. Thank you again ----- Message ----- From: Rowe Pavy, RN Sent: 05/13/2020   9:42 AM EDT To: Tobe Sos Heilingoetter, PA-C Subject: Ok for participate in group exercise with Pu#   Hi Cassandra,  The above pt referred to Pulmonary Rehab at [facility] by Dr. 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  Pt  had his last cycle of adjuvant chemotherapy with carboplatin and paclitaxel with cycle #4 on 5/6 and has scheduled appt with you on 6/9.  May pt participate in a group setting for exercise?  If so, any additional precautions we would need to abide?  Currently pt are screened prior to entry in the gym, all patients/ staff will wear a mask and pt are assigned to a pod of equipment that only they will exercise I during class.   Thanks so much for your advisement Maurice Small RN, BSN Cardiac and Pulmonary Rehab Nurse Navigator

## 2020-05-13 NOTE — Progress Notes (Signed)
Received referral from Dr Servando Snare for this pt to participate in pulmonary rehab with the the diagnosis of Dyspnea. Pt is S/p Right lung lobectomy for lung CA on 01/24/20. Clinical review of pt follow up appt on 5/22 office note with CVTS and Oncology follow up and adjunct treatment.  Pt with Covid Risk Score - 6. Pt appropriate for scheduling for Pulmonary rehab after determination whether he will continue to see Eric Form NP at [facility]. Will forward to support staff for  verification of insurance eligibility/benefits with pt consent. Cherre Huger, BSN Cardiac and Training and development officer

## 2020-05-13 NOTE — Telephone Encounter (Signed)
Pt insurance is active and benefits verified through Medicare a/b Co-pay 0, DED $203/$203 met, out of pocket 0/0 met, co-insurance 20%. no pre-authorization required.   2ndary insurance is active and benefits verified through [employer]. Co-pay 0, DED 0/0 met, out of pocket 0/0 met, co-insurance 0. No pre-authorization required.  Pt is aware that he will have to contact tricare to get an authroization sent to cardiac rehab.

## 2020-05-13 NOTE — Telephone Encounter (Signed)
Received referral from Dr. Servando Snare for this Daniel Colon to participate in Pulmonary Rehab with the diagnosis of S/p Lobectomy of lung. Daniel Colon had on 2/3 Right lung lobectomy for Lung CA.  Will verify whether this diagnosis is reimbursable by Medicare AB for pulmonary rehab.    Called Daniel Colon to clarify with him what the plans if any were for continuing to see [place] [facility] pulmonary for ongoing medical management.  Spoke to Mr. Faucett who indicated that he had some correspondences with Eric Form NP via [facility] regarding pulmonary rehab but did not indicate he needed to be seen post surgery.  Advised Daniel Colon that I would contact Dr. Aura Dials for oncology clearance - exercise in group setting.  Will also contact Eric Form NP to clarify if Daniel Colon needs to be seen in follow up. Reviewed with Daniel Colon workflow for scheduling and general operations for Pulmonary Rehab.  Daniel Colon eager to participate. Cherre Huger, BSN Cardiac and Training and development officer

## 2020-05-15 ENCOUNTER — Other Ambulatory Visit (HOSPITAL_COMMUNITY): Payer: Self-pay | Admitting: *Deleted

## 2020-05-15 ENCOUNTER — Other Ambulatory Visit: Payer: Self-pay

## 2020-05-15 ENCOUNTER — Inpatient Hospital Stay: Payer: Medicare Other

## 2020-05-15 ENCOUNTER — Telehealth (HOSPITAL_COMMUNITY): Payer: Self-pay | Admitting: *Deleted

## 2020-05-15 DIAGNOSIS — Z902 Acquired absence of lung [part of]: Secondary | ICD-10-CM | POA: Diagnosis not present

## 2020-05-15 DIAGNOSIS — R0600 Dyspnea, unspecified: Secondary | ICD-10-CM

## 2020-05-15 DIAGNOSIS — M791 Myalgia, unspecified site: Secondary | ICD-10-CM | POA: Diagnosis not present

## 2020-05-15 DIAGNOSIS — C3411 Malignant neoplasm of upper lobe, right bronchus or lung: Secondary | ICD-10-CM | POA: Diagnosis not present

## 2020-05-15 DIAGNOSIS — C3490 Malignant neoplasm of unspecified part of unspecified bronchus or lung: Secondary | ICD-10-CM

## 2020-05-15 DIAGNOSIS — R0602 Shortness of breath: Secondary | ICD-10-CM | POA: Diagnosis not present

## 2020-05-15 DIAGNOSIS — M1990 Unspecified osteoarthritis, unspecified site: Secondary | ICD-10-CM | POA: Diagnosis not present

## 2020-05-15 DIAGNOSIS — Z5111 Encounter for antineoplastic chemotherapy: Secondary | ICD-10-CM | POA: Diagnosis not present

## 2020-05-15 LAB — CMP (CANCER CENTER ONLY)
ALT: 37 U/L (ref 0–44)
AST: 17 U/L (ref 15–41)
Albumin: 3.7 g/dL (ref 3.5–5.0)
Alkaline Phosphatase: 86 U/L (ref 38–126)
Anion gap: 11 (ref 5–15)
BUN: 23 mg/dL (ref 8–23)
CO2: 25 mmol/L (ref 22–32)
Calcium: 9 mg/dL (ref 8.9–10.3)
Chloride: 103 mmol/L (ref 98–111)
Creatinine: 0.84 mg/dL (ref 0.61–1.24)
GFR, Est AFR Am: >60 mL/min (ref >60)
GFR, Estimated: >60 mL/min (ref >60)
Glucose, Bld: 107 mg/dL — ABNORMAL HIGH (ref 70–99)
Potassium: 4.4 mmol/L (ref 3.5–5.1)
Sodium: 139 mmol/L (ref 135–145)
Total Bilirubin: 0.5 mg/dL (ref 0.3–1.2)
Total Protein: 6.4 g/dL — ABNORMAL LOW (ref 6.5–8.1)

## 2020-05-15 LAB — CBC WITH DIFFERENTIAL (CANCER CENTER ONLY)
Abs Immature Granulocytes: 0.08 10*3/uL — ABNORMAL HIGH (ref 0.00–0.07)
Basophils Absolute: 0 10*3/uL (ref 0.0–0.1)
Basophils Relative: 0 %
Eosinophils Absolute: 0 10*3/uL (ref 0.0–0.5)
Eosinophils Relative: 0 %
HCT: 31.6 % — ABNORMAL LOW (ref 39.0–52.0)
Hemoglobin: 10.3 g/dL — ABNORMAL LOW (ref 13.0–17.0)
Immature Granulocytes: 1 %
Lymphocytes Relative: 13 %
Lymphs Abs: 1 10*3/uL (ref 0.7–4.0)
MCH: 27.8 pg (ref 26.0–34.0)
MCHC: 32.6 g/dL (ref 30.0–36.0)
MCV: 85.4 fL (ref 80.0–100.0)
Monocytes Absolute: 0.5 10*3/uL (ref 0.1–1.0)
Monocytes Relative: 7 %
Neutro Abs: 6.2 10*3/uL (ref 1.7–7.7)
Neutrophils Relative %: 79 %
Platelet Count: 136 10*3/uL — ABNORMAL LOW (ref 150–400)
RBC: 3.7 MIL/uL — ABNORMAL LOW (ref 4.22–5.81)
RDW: 20.1 % — ABNORMAL HIGH (ref 11.5–15.5)
WBC Count: 7.9 10*3/uL (ref 4.0–10.5)
nRBC: 0 % (ref 0.0–0.2)

## 2020-05-15 NOTE — Telephone Encounter (Signed)
Received referral message from Anselm Lis NP - asked to have ambulatory referral to pulmonology - [place] to establish care.  Pt seen previously through the screening program.  Called and left message for pt to anticipate a call from [facility] pulmonary for scheduling an appt. May then schedule for pulmonary rehab orientation. Cherre Huger, BSN Cardiac and Training and development officer

## 2020-05-19 ENCOUNTER — Other Ambulatory Visit: Payer: Self-pay | Admitting: Cardiology

## 2020-05-21 ENCOUNTER — Other Ambulatory Visit: Payer: Self-pay | Admitting: Medical Oncology

## 2020-05-21 DIAGNOSIS — C3491 Malignant neoplasm of unspecified part of right bronchus or lung: Secondary | ICD-10-CM

## 2020-05-22 ENCOUNTER — Inpatient Hospital Stay: Payer: Medicare Other | Attending: Internal Medicine

## 2020-05-22 ENCOUNTER — Other Ambulatory Visit: Payer: Self-pay

## 2020-05-22 DIAGNOSIS — Z79899 Other long term (current) drug therapy: Secondary | ICD-10-CM | POA: Insufficient documentation

## 2020-05-22 DIAGNOSIS — C3411 Malignant neoplasm of upper lobe, right bronchus or lung: Secondary | ICD-10-CM | POA: Insufficient documentation

## 2020-05-22 DIAGNOSIS — C3491 Malignant neoplasm of unspecified part of right bronchus or lung: Secondary | ICD-10-CM

## 2020-05-22 DIAGNOSIS — I10 Essential (primary) hypertension: Secondary | ICD-10-CM | POA: Diagnosis not present

## 2020-05-22 DIAGNOSIS — Z9221 Personal history of antineoplastic chemotherapy: Secondary | ICD-10-CM | POA: Diagnosis not present

## 2020-05-22 DIAGNOSIS — Z8719 Personal history of other diseases of the digestive system: Secondary | ICD-10-CM | POA: Insufficient documentation

## 2020-05-22 DIAGNOSIS — I252 Old myocardial infarction: Secondary | ICD-10-CM | POA: Diagnosis not present

## 2020-05-22 LAB — CMP (CANCER CENTER ONLY)
ALT: 20 U/L (ref 0–44)
AST: 13 U/L — ABNORMAL LOW (ref 15–41)
Albumin: 3.5 g/dL (ref 3.5–5.0)
Alkaline Phosphatase: 62 U/L (ref 38–126)
Anion gap: 9 (ref 5–15)
BUN: 20 mg/dL (ref 8–23)
CO2: 23 mmol/L (ref 22–32)
Calcium: 8.8 mg/dL — ABNORMAL LOW (ref 8.9–10.3)
Chloride: 107 mmol/L (ref 98–111)
Creatinine: 0.82 mg/dL (ref 0.61–1.24)
GFR, Est AFR Am: >60 mL/min (ref >60)
GFR, Estimated: >60 mL/min (ref >60)
Glucose, Bld: 112 mg/dL — ABNORMAL HIGH (ref 70–99)
Potassium: 4.2 mmol/L (ref 3.5–5.1)
Sodium: 139 mmol/L (ref 135–145)
Total Bilirubin: 0.3 mg/dL (ref 0.3–1.2)
Total Protein: 6 g/dL — ABNORMAL LOW (ref 6.5–8.1)

## 2020-05-22 LAB — CBC WITH DIFFERENTIAL (CANCER CENTER ONLY)
Abs Immature Granulocytes: 0.04 10*3/uL (ref 0.00–0.07)
Basophils Absolute: 0 10*3/uL (ref 0.0–0.1)
Basophils Relative: 1 %
Eosinophils Absolute: 0 10*3/uL (ref 0.0–0.5)
Eosinophils Relative: 1 %
HCT: 30.9 % — ABNORMAL LOW (ref 39.0–52.0)
Hemoglobin: 9.9 g/dL — ABNORMAL LOW (ref 13.0–17.0)
Immature Granulocytes: 1 %
Lymphocytes Relative: 15 %
Lymphs Abs: 0.9 10*3/uL (ref 0.7–4.0)
MCH: 28.4 pg (ref 26.0–34.0)
MCHC: 32 g/dL (ref 30.0–36.0)
MCV: 88.5 fL (ref 80.0–100.0)
Monocytes Absolute: 0.6 10*3/uL (ref 0.1–1.0)
Monocytes Relative: 9 %
Neutro Abs: 4.5 10*3/uL (ref 1.7–7.7)
Neutrophils Relative %: 73 %
Platelet Count: 143 10*3/uL — ABNORMAL LOW (ref 150–400)
RBC: 3.49 MIL/uL — ABNORMAL LOW (ref 4.22–5.81)
RDW: 19.4 % — ABNORMAL HIGH (ref 11.5–15.5)
WBC Count: 6.1 10*3/uL (ref 4.0–10.5)
nRBC: 0 % (ref 0.0–0.2)

## 2020-05-24 ENCOUNTER — Telehealth (HOSPITAL_COMMUNITY): Payer: Self-pay

## 2020-05-24 NOTE — Telephone Encounter (Signed)
Pt called and stated that he needed to schedule for pulmonary rehab, advised pt of the previous note that he needed to sch with [facility] gave pt [place] number.

## 2020-05-27 ENCOUNTER — Ambulatory Visit (HOSPITAL_COMMUNITY)
Admission: RE | Admit: 2020-05-27 | Discharge: 2020-05-27 | Disposition: A | Discharge status: Home / Self Care | Payer: Medicare Other | Source: Ambulatory Visit | Attending: Physician Assistant | Admitting: Physician Assistant

## 2020-05-27 ENCOUNTER — Encounter (HOSPITAL_COMMUNITY): Payer: Self-pay

## 2020-05-27 ENCOUNTER — Other Ambulatory Visit: Payer: Self-pay

## 2020-05-27 DIAGNOSIS — C3411 Malignant neoplasm of upper lobe, right bronchus or lung: Secondary | ICD-10-CM | POA: Diagnosis not present

## 2020-05-27 DIAGNOSIS — J439 Emphysema, unspecified: Secondary | ICD-10-CM | POA: Diagnosis not present

## 2020-05-27 DIAGNOSIS — C3490 Malignant neoplasm of unspecified part of unspecified bronchus or lung: Secondary | ICD-10-CM | POA: Diagnosis not present

## 2020-05-27 MED ORDER — IOHEXOL 300 MG/ML  SOLN
75.0000 mL | Freq: Once | INTRAMUSCULAR | Status: AC | PRN
  Administered 2020-05-27: 75 mL via INTRAVENOUS

## 2020-05-27 MED ORDER — SODIUM CHLORIDE (PF) 0.9 % IJ SOLN
INTRAMUSCULAR | Status: AC
  Filled 2020-05-27: qty 50

## 2020-05-27 NOTE — Progress Notes (Signed)
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 Servando Snare 2) Adjuvant systemic chemotherapy with carboplatin for AUC of 6 and paclitaxel 200 mg/M2 every 3 weeks with Neulasta support. Last dose 05/01/20.Status post 4 cycles.  CURRENT THERAPY: Observation  INTERVAL HISTORY: Daniel Colon 74 y.o. male returns to the clinic for a follow up visit accompanied by his wife. The patient is feeling well today without any concerning complaints. The patient recently completed 4 cycles of adjuvant chemotherapy and tolerated it without any adverse effects except for arthralgias and myalgias secondary to his Neulasta injection. Denies any fever, chills, night sweats, or weight loss. Denies any chest pain, cough, or hemoptysis. He reports his shortness of breath is improving. He still experiences an occasional sharp pain in the right lateral abdomen/righ lower rib cage near the surgical site from his prior lobectomy. Denies any nausea, vomiting, diarrhea, or constipation. Denies any headache or visual changes. The patient recently had a restaging CT scan. The patient is here today for evaluation and to review his scan results.     MEDICAL HISTORY: Past Medical History:  Diagnosis Date  . Allergy   . Arthritis   . Barrett's esophagus   . CAD (coronary artery disease)    a. Stent to the [facility] (in [place]);  b. ETT(11/13/13): abnormal with early ST changes to suggest ischemia;   c. LHC (11/17/13):  pLAD 30-40, D1 30, mLAD 70-75, mCFX 70, mRCA stent 40 (ISR), EF 55-65%.  Medical Rx  . Cataract    left eye removed, right immature  . Colon polyps   . Dyslipidemia   . GERD (gastroesophageal reflux disease)   . Hypertension   .  Myocardial infarction ([place]) 1996  . Nephrolithiasis    kidney stone  . PUD (peptic ulcer disease)     ALLERGIES:  has No Known Allergies.  MEDICATIONS:  Current Outpatient Medications  Medication Sig Dispense Refill  . acetaminophen (TYLENOL) 500 MG tablet Take 500-1,000 mg by mouth every 6 (six) hours as needed (for pain.).    Marland Kitchen aspirin EC 81 MG tablet Take 81 mg by mouth 3 (three) times a week. In the morning    . atorvastatin (LIPITOR) 40 MG tablet TAKE 1 TABLET DAILY 90 tablet 3  . BUDESONIDE-FORMOTEROL FUMARATE IN Inhale into the lungs.    . carvedilol (COREG) 12.5 MG tablet Take 12.5 mg by mouth daily.     . Coenzyme Q10 (CO Q-10) 100 MG CAPS Take 100 mg by mouth daily.     Marland Kitchen docusate sodium (COLACE) 100 MG capsule Take 100 mg by mouth daily as needed for mild constipation.    Marland Kitchen ezetimibe (ZETIA) 10 MG tablet Take 10 mg by mouth daily.    . fish oil-omega-3 fatty acids 1000 MG capsule Take 1,000 mg by mouth daily.     Marland Kitchen guaiFENesin (MUCINEX) 600 MG 12 hr tablet Take 1 tablet (600 mg total) by mouth 2 (two) times daily as needed for cough or to loosen phlegm.    Marland Kitchen ipratropium (ATROVENT) 0.03 % nasal spray Place 1 spray into both nostrils daily.     . Multiple Vitamin (MULTIVITAMIN WITH MINERALS) TABS tablet Take 1 tablet by mouth daily.    . nitroGLYCERIN (NITROLINGUAL)  0.4 MG/SPRAY spray USE 1 SPRAY ON OR UNDER THE TONGUE EVERY 5 MINUTES FOR 3 DOSES AS NEEDED FOR CHEST PAIN AS DIRECTED BY PRESCRIBER OR PACKAGE INSTRUCTIONS 24 g 3  . omeprazole (PRILOSEC) 10 MG capsule Take 10 mg by mouth daily.    Marland Kitchen oxybutynin (DITROPAN) 5 MG tablet Take 5 mg by mouth daily.     . prochlorperazine (COMPAZINE) 10 MG tablet Take 1 tablet (10 mg total) by mouth every 6 (six) hours as needed for nausea or vomiting. 30 tablet 0  . senna (SENOKOT) 176 MG/5ML SYRP Take by mouth.    . Sennosides (SENNA LAXATIVE) 25 MG TABS Take 1 tablet by mouth daily.    Marland Kitchen telmisartan-hydrochlorothiazide (MICARDIS HCT)  80-12.5 MG tablet Take 1 tablet by mouth daily. 90 tablet 3  . traMADol (ULTRAM) 50 MG tablet Take 1 tablet (50 mg total) by mouth every 6 (six) hours as needed (mild pain). 20 tablet 0   No current facility-administered medications for this visit.    SURGICAL HISTORY:  Past Surgical History:  Procedure Laterality Date  . ANGIOPLASTY     stent - rt cor. art  . BILROTH II PROCEDURE    . COLONOSCOPY    . INTERCOSTAL NERVE BLOCK Right 01/24/2020   Procedure: Intercostal Nerve Block;  Surgeon: Grace Isaac, MD;  Location: [facility];  Service: Thoracic;  Laterality: Right;  . LEFT HEART CATHETERIZATION WITH CORONARY ANGIOGRAM N/A 11/17/2013   Procedure: LEFT HEART CATHETERIZATION WITH CORONARY ANGIOGRAM;  Surgeon: Blane Ohara, MD;  Location: [facility] CATH LAB;  Service: Cardiovascular;  Laterality: N/A;  . LYMPH NODE DISSECTION N/A 01/24/2020   Procedure: LYMPH NODE DISSECTION;  Surgeon: Grace Isaac, MD;  Location: [facility];  Service: Thoracic;  Laterality: N/A;  . POLYPECTOMY    . ulcer surgery    . ULNAR NERVE TRANSPOSITION Right 08/05/2017   Procedure: RIGHT ULNAR NERVE DECOMPRESSION;  Surgeon: Leanora Cover, MD;  Location: [facility];  Service: Orthopedics;  Laterality: Right;  Marland Kitchen VIDEO BRONCHOSCOPY N/A 01/24/2020   Procedure: VIDEO BRONCHOSCOPY;  Surgeon: Grace Isaac, MD;  Location: [facility] OR;  Service: Thoracic;  Laterality: N/A;    REVIEW OF SYSTEMS:   Review of Systems  Constitutional: Negative for appetite change, chills, fatigue, fever and unexpected weight change.  HENT: Negative for mouth sores, nosebleeds, sore throat and trouble swallowing.   Eyes: Negative for eye problems and icterus.  Respiratory: Negative for cough, hemoptysis, shortness of breath and wheezing.   Cardiovascular: Numbness on right chest near lobectomy site. Negative for leg swelling.  Gastrointestinal: Positive for occasional right lateral abdominal sharp pain. Negative for abdominal pain,  constipation, diarrhea, nausea and vomiting.  Genitourinary: Negative for bladder incontinence, difficulty urinating, dysuria, frequency and hematuria.   Musculoskeletal: Negative for back pain, gait problem, neck pain and neck stiffness.  Skin: Negative for itching and rash.  Neurological: Negative for dizziness, extremity weakness, gait problem, headaches, light-headedness and seizures.  Hematological: Negative for adenopathy. Does not bruise/bleed easily.  Psychiatric/Behavioral: Negative for confusion, depression and sleep disturbance. The patient is not nervous/anxious.     PHYSICAL EXAMINATION:  Blood pressure (!) 102/52, pulse 65, temperature 97.9 F (36.6 C), temperature source Temporal, resp. rate 17, height 5\' 9"  (1.753 m), weight 205 lb 12.8 oz (93.4 kg), SpO2 98 %.  ECOG PERFORMANCE STATUS: 1 - Symptomatic but completely ambulatory  Physical Exam  Constitutional: Oriented to person, place, and time and well-developed, well-nourished, and in no distress.  HENT:  Head: Normocephalic and atraumatic.  Mouth/Throat: Oropharynx is clear and moist. No oropharyngeal exudate.  Eyes: Conjunctivae are normal. Right eye exhibits no discharge. Left eye exhibits no discharge. No scleral icterus.  Neck: Normal range of motion. Neck supple.  Cardiovascular: Normal rate, regular rhythm, normal heart sounds and intact distal pulses.   Pulmonary/Chest: Effort normal and breath sounds normal. No respiratory distress. No wheezes. No rales.  Abdominal: Soft. Bowel sounds are normal. Exhibits no distension and no mass. There is no tenderness.  Musculoskeletal: Normal range of motion. Exhibits no edema.  Lymphadenopathy:    No cervical adenopathy.  Neurological: Alert and oriented to person, place, and time. Exhibits normal muscle tone. Gait normal. Coordination normal.  Skin: Several incision scars on abdomen and lateral right chest. Skin is warm and dry. No rash noted. Not diaphoretic. No  erythema. No pallor.  Psychiatric: Mood, memory and judgment normal.  Vitals reviewed.  LABORATORY DATA: Lab Results  Component Value Date   WBC 6.1 05/22/2020   HGB 9.9 (L) 05/22/2020   HCT 30.9 (L) 05/22/2020   MCV 88.5 05/22/2020   PLT 143 (L) 05/22/2020      Chemistry      Component Value Date/Time   NA 139 05/22/2020 0716   K 4.2 05/22/2020 0716   CL 107 05/22/2020 0716   CO2 23 05/22/2020 0716   BUN 20 05/22/2020 0716   CREATININE 0.82 05/22/2020 0716      Component Value Date/Time   CALCIUM 8.8 (L) 05/22/2020 0716   ALKPHOS 62 05/22/2020 0716   AST 13 (L) 05/22/2020 0716   ALT 20 05/22/2020 0716   BILITOT 0.3 05/22/2020 0716       RADIOGRAPHIC STUDIES:  DG Chest 2 View  Result Date: 05/09/2020 CLINICAL DATA:  History of lung cancer EXAM: CHEST - 2 VIEW COMPARISON:  03/21/2020 FINDINGS: Stable cardiomediastinal contours. Postoperative changes from right upper lobectomy with loculated pleural fluid collection at the right lung apex. Interval resolution of the previously seen air component within this collection. Remaining lung fields are otherwise clear. No pneumothorax. IMPRESSION: Postoperative changes from right upper lobectomy with interval resolution of the previously seen air component within the loculated pleural fluid collection at the right lung apex. Electronically Signed   By: Davina Poke D.O.   On: 05/09/2020 12:33   CT Chest W Contrast  Result Date: 05/27/2020 CLINICAL DATA:  Restaging lung cancer. Chemotherapy complete May 2021. Right-sided chest pain, cough and shortness of breath. EXAM: CT CHEST WITH CONTRAST TECHNIQUE: Multidetector CT imaging of the chest was performed during intravenous contrast administration. CONTRAST:  37mL OMNIPAQUE IOHEXOL 300 MG/ML  SOLN COMPARISON:  PET 01/05/2020 and CT chest 12/27/2019. FINDINGS: Cardiovascular: Atherosclerotic calcification of the aorta and coronary arteries. Heart size normal. No pericardial effusion.  Mediastinum/Nodes: Right paratracheal lymph nodes are not enlarged by CT size criteria. No hilar or axillary adenopathy. Esophagus is unremarkable. Lungs/Pleura: Right upper lobectomy with fluid in the upper right hemithorax. No pleural air. Centrilobular emphysema. 2 mm peripheral left lower lobe nodule (7/109), unchanged. Trace fluid the base of the right hemithorax. Airway is otherwise unremarkable. Upper Abdomen: Subcentimeter low-attenuation lesion in the liver is too small to characterize. Visualized portions of the liver and gallbladder are otherwise unremarkable. 2.1 cm right adrenal nodule, previously characterized as an adenoma on 12/27/2019. Slight nodular thickening of the left adrenal gland, similar. Visualized portions of the kidneys, spleen and pancreas are unremarkable. Gastrojejunostomy. No upper abdominal adenopathy. Musculoskeletal: Degenerative changes in the spine. No worrisome  lytic or sclerotic lesions. IMPRESSION: 1. Right upper lobectomy with loculated fluid in the upper right hemithorax. No additional findings to explain the patient's symptoms. 2. Right adrenal adenoma. 3. Aortic atherosclerosis (ICD10-I70.0). Coronary artery calcification. 4.  Emphysema (ICD10-J43.9). Electronically Signed   By: Lorin Picket M.D.   On: 05/27/2020 09:57     ASSESSMENT/PLAN:  This is a very pleasant 74 year old Caucasian male diagnosed with stage IIb carcinoma with sarcomatoid features in February 2021. He had a right upper lobectomy with lymph node dissection under the care of Dr. Servando Snare.   The patient recently completed adjuvant systemic chemotherapy with carboplatin for an AUC of 6 and paclitaxel 200 mg/m IV every 3 weeks with Neulasta support. He is status post 4 cycles and he tolerated it well except for some fatigue with his Neulasta injection.  The patient recently had a restaging CT scan performed. Dr. Julien Nordmann personally and independently reviewed the scan with the patient. The  scan did not show any evidence of disease recurrence.   Dr. Julien Nordmann recommends that he continue on observation with a routine scan in 3 months. I will arrange for a restaging CT scan in 3 months.   The patient was advised to call immediately if he has any concerning symptoms in the interval. The patient voices understanding of current disease status and treatment options and is in agreement with the current care plan. All questions were answered. The patient knows to call the clinic with any problems, questions or concerns. We can certainly see the patient much sooner if necessary     Orders Placed This Encounter  Procedures  . CT Chest W Contrast    Standing Status:   Future    Standing Expiration Date:   05/29/2021    Order Specific Question:   ** REASON FOR EXAM (FREE TEXT)    Answer:   Restaging CT scan for history of lung cancer    Order Specific Question:   If indicated for the ordered procedure, I authorize the administration of contrast media per Radiology protocol    Answer:   Yes    Order Specific Question:   Preferred imaging location?    Answer:   [facility]    Order Specific Question:   Radiology Contrast Protocol - do NOT remove file path    Answer:   [email or web address]  . CBC with Differential ([place] Only)    Standing Status:   Future    Standing Expiration Date:   05/29/2021  . CMP ([place] only)    Standing Status:   Future    Standing Expiration Date:   05/29/2021     Tobe Sos Sheron Robin, PA-C 05/29/20  ADDENDUM: Hematology/Oncology Attending: I had a face-to-face encounter with the patient today.  I recommended his care plan.  This is a very pleasant 74 years old white male diagnosed with stage IIb non-small cell lung cancer, with sarcomatoid features in February 2021 status post right upper lobectomy with lymph node dissection.  The patient also underwent 4 cycles of adjuvant systemic chemotherapy with  carboplatin and paclitaxel with Neulasta support.  He tolerated his treatment well except for fatigue. The patient had repeat CT scan of the chest performed recently.  I personally and independently reviewed the scans and discussed the results with the patient and his wife. His scan showed no concerning findings for disease recurrence or metastasis. I recommended for the patient to continue on observation with repeat CT scan of the chest in 3 months for  restaging of his disease. The patient was advised to call immediately if he has any concerning symptoms in the interval.  Disclaimer: This note was dictated with voice recognition software. Similar sounding words can inadvertently be transcribed and may be missed upon review. Eilleen Kempf, MD 05/29/20

## 2020-05-29 ENCOUNTER — Encounter: Payer: Self-pay | Admitting: Physician Assistant

## 2020-05-29 ENCOUNTER — Inpatient Hospital Stay (HOSPITAL_BASED_OUTPATIENT_CLINIC_OR_DEPARTMENT_OTHER): Payer: Medicare Other | Admitting: Physician Assistant

## 2020-05-29 ENCOUNTER — Other Ambulatory Visit: Payer: Self-pay

## 2020-05-29 VITALS — BP 102/52 | HR 65 | Temp 97.9°F | Resp 17 | Ht 69.0 in | Wt 205.8 lb

## 2020-05-29 DIAGNOSIS — C3490 Malignant neoplasm of unspecified part of unspecified bronchus or lung: Secondary | ICD-10-CM | POA: Diagnosis not present

## 2020-05-29 DIAGNOSIS — C3411 Malignant neoplasm of upper lobe, right bronchus or lung: Secondary | ICD-10-CM | POA: Diagnosis not present

## 2020-05-29 DIAGNOSIS — Z8719 Personal history of other diseases of the digestive system: Secondary | ICD-10-CM | POA: Diagnosis not present

## 2020-05-29 DIAGNOSIS — Z79899 Other long term (current) drug therapy: Secondary | ICD-10-CM | POA: Diagnosis not present

## 2020-05-29 DIAGNOSIS — I252 Old myocardial infarction: Secondary | ICD-10-CM | POA: Diagnosis not present

## 2020-05-29 DIAGNOSIS — I10 Essential (primary) hypertension: Secondary | ICD-10-CM | POA: Diagnosis not present

## 2020-05-29 DIAGNOSIS — Z9221 Personal history of antineoplastic chemotherapy: Secondary | ICD-10-CM | POA: Diagnosis not present

## 2020-05-30 ENCOUNTER — Ambulatory Visit: Payer: Medicare Other | Admitting: Internal Medicine

## 2020-05-30 ENCOUNTER — Telehealth: Payer: Self-pay | Admitting: Internal Medicine

## 2020-05-30 NOTE — Telephone Encounter (Signed)
Scheduled appts per 6/9 los. Pt confirmed appt date and time.

## 2020-06-03 ENCOUNTER — Telehealth (HOSPITAL_COMMUNITY): Payer: Self-pay | Admitting: *Deleted

## 2020-06-03 NOTE — Telephone Encounter (Signed)
Called to discuss referral to pulmonary rehab with Daniel Colon.  He is interested, but plays golf every Tues and Thurs which would be his exercise days in the program.  He would like to exercise with [place] after playing golf earlier in the day.  In the past we do not think this is advisable and too tiring.  I need to check with our exercise physiologist to see if this is permitted.  Will call patient with information.

## 2020-06-04 ENCOUNTER — Telehealth (HOSPITAL_COMMUNITY): Payer: Self-pay | Admitting: *Deleted

## 2020-06-04 NOTE — Telephone Encounter (Signed)
Called to arrange orientation/walk test 06/05/2020 @ 1030, instructed to wear sneakers, no sandles. He will be in the 1400 exercise slot.   Patient will be playing 18 holes of golf before coming to exercise on Tuesdays and Thursdays.  Instructed that he has to be on time, hydrated, and eat lunch or he will not be allowed to exercise.  I was giving directions to patient and he cut me off because he had to "tee off" on the golf course.

## 2020-06-05 ENCOUNTER — Other Ambulatory Visit: Payer: Self-pay

## 2020-06-05 ENCOUNTER — Encounter (HOSPITAL_COMMUNITY)
Admission: RE | Admit: 2020-06-05 | Discharge: 2020-06-05 | Disposition: A | Discharge status: Home / Self Care | Payer: Medicare Other | Source: Ambulatory Visit | Attending: Cardiothoracic Surgery | Admitting: Cardiothoracic Surgery

## 2020-06-05 ENCOUNTER — Encounter (HOSPITAL_COMMUNITY): Payer: Self-pay

## 2020-06-05 VITALS — BP 126/70 | HR 59 | Ht 68.5 in | Wt 205.9 lb

## 2020-06-05 DIAGNOSIS — R0600 Dyspnea, unspecified: Secondary | ICD-10-CM | POA: Diagnosis present

## 2020-06-05 DIAGNOSIS — Z902 Acquired absence of lung [part of]: Secondary | ICD-10-CM | POA: Insufficient documentation

## 2020-06-05 NOTE — Progress Notes (Signed)
Daniel Colon 74 y.o. male Pulmonary Rehab Orientation Note Patient arrived today in Cardiac and Pulmonary Rehab for orientation to Pulmonary Rehab. He walked from the [facility] parking deck with mild shortness of breath. He does not carry portable oxygen. Per pt, he uses oxygen never. Color good, skin warm and dry. Patient is oriented to time and place. Patient's medical history, psychosocial health, and medications reviewed. Psychosocial assessment reveals pt lives with their spouse. Pt is currently retired. Pt hobbies include golfing. Pt reports his stress level is low.  Pt does not exhibit signs of depression. PHQ2/9 score 0/0. Pt shows good  coping skills with positive outlook .  Will continue to monitor and evaluate progress toward psychosocial goal(s) of continued emotional well being while participating in pulmonary rehab. Physical assessment reveals heart rate is normal, breath sounds clear to auscultation, no wheezes, rales, or rhonchi. Grip strength equal, strong.  Patient reports he does take medications as prescribed. Patient states he follows a Regular diet. The patient reports no specific efforts to gain or lose weight.. Patient's weight will be monitored closely. Demonstration and practice of PLB using pulse oximeter. Patient able to return demonstration satisfactorily. Safety and hand hygiene in the exercise area reviewed with patient. Patient voices understanding of the information reviewed. Department expectations discussed with patient and achievable goals were set. The patient shows enthusiasm about attending the program and we look forward to working with this nice gentleman. The patient completed a 6 min walk test today, 06/05/2020 and to begin exercise on Tuesday 06/11/2020 in the 1400 exercise slot.   [phone]

## 2020-06-05 NOTE — Progress Notes (Signed)
Pulmonary Individual Treatment Plan  Patient Details  Name: Daniel Colon MRN: [id number] Date of Birth: 12-17-1946 Referring Provider:     Pulmonary Rehab Walk Test from 06/05/2020 in [place]  Referring Provider Grace Isaac, MD      Initial Encounter Date:    Pulmonary Rehab Walk Test from 06/05/2020 in [place]  Date 06/05/20      Visit Diagnosis: Dyspnea, unspecified type  Patient's Home Medications on Admission:   Current Outpatient Medications:  .  acetaminophen (TYLENOL) 500 MG tablet, Take 500-1,000 mg by mouth every 6 (six) hours as needed (for pain.)., Disp: , Rfl:  .  aspirin EC 81 MG tablet, Take 81 mg by mouth 3 (three) times a week. In the morning, Disp: , Rfl:  .  atorvastatin (LIPITOR) 40 MG tablet, TAKE 1 TABLET DAILY, Disp: 90 tablet, Rfl: 3 .  BUDESONIDE-FORMOTEROL FUMARATE IN, Inhale into the lungs., Disp: , Rfl:  .  carvedilol (COREG) 12.5 MG tablet, Take 12.5 mg by mouth daily. , Disp: , Rfl:  .  Coenzyme Q10 (CO Q-10) 100 MG CAPS, Take 100 mg by mouth daily. , Disp: , Rfl:  .  docusate sodium (COLACE) 100 MG capsule, Take 100 mg by mouth daily as needed for mild constipation., Disp: , Rfl:  .  ezetimibe (ZETIA) 10 MG tablet, Take 10 mg by mouth daily., Disp: , Rfl:  .  fish oil-omega-3 fatty acids 1000 MG capsule, Take 1,000 mg by mouth daily. , Disp: , Rfl:  .  guaiFENesin (MUCINEX) 600 MG 12 hr tablet, Take 1 tablet (600 mg total) by mouth 2 (two) times daily as needed for cough or to loosen phlegm., Disp: , Rfl:  .  ipratropium (ATROVENT) 0.03 % nasal spray, Place 1 spray into both nostrils daily. , Disp: , Rfl:  .  Multiple Vitamin (MULTIVITAMIN WITH MINERALS) TABS tablet, Take 1 tablet by mouth daily., Disp: , Rfl:  .  nitroGLYCERIN (NITROLINGUAL) 0.4 MG/SPRAY spray, USE 1 SPRAY ON OR UNDER THE TONGUE EVERY 5 MINUTES FOR 3 DOSES AS NEEDED FOR CHEST PAIN AS DIRECTED BY PRESCRIBER OR  PACKAGE INSTRUCTIONS, Disp: 24 g, Rfl: 3 .  omeprazole (PRILOSEC) 10 MG capsule, Take 10 mg by mouth daily., Disp: , Rfl:  .  oxybutynin (DITROPAN) 5 MG tablet, Take 5 mg by mouth daily. , Disp: , Rfl:  .  prochlorperazine (COMPAZINE) 10 MG tablet, Take 1 tablet (10 mg total) by mouth every 6 (six) hours as needed for nausea or vomiting., Disp: 30 tablet, Rfl: 0 .  senna (SENOKOT) 176 MG/5ML SYRP, Take by mouth., Disp: , Rfl:  .  Sennosides (SENNA LAXATIVE) 25 MG TABS, Take 1 tablet by mouth daily., Disp: , Rfl:  .  telmisartan-hydrochlorothiazide (MICARDIS HCT) 80-12.5 MG tablet, Take 1 tablet by mouth daily., Disp: 90 tablet, Rfl: 3 .  traMADol (ULTRAM) 50 MG tablet, Take 1 tablet (50 mg total) by mouth every 6 (six) hours as needed (mild pain)., Disp: 20 tablet, Rfl: 0  Past Medical History: Past Medical History:  Diagnosis Date  . Allergy   . Arthritis   . Barrett's esophagus   . CAD (coronary artery disease)    a. Stent to the [place] (in [place]);  b. ETT(11/13/13): abnormal with early ST changes to suggest ischemia;   c. LHC (11/17/13):  pLAD 30-40, D1 30, mLAD 70-75, mCFX 70, mRCA stent 40 (ISR), EF 55-65%.  Medical Rx  . Cataract  left eye removed, right immature  . Colon polyps   . Dyslipidemia   . GERD (gastroesophageal reflux disease)   . Hypertension   . Myocardial infarction ([place]) 1996  . Nephrolithiasis    kidney stone  . PUD (peptic ulcer disease)     Tobacco Use: Social History   Tobacco Use  Smoking Status Former Smoker  . Packs/day: 1.00  . Years: 42.00  . Pack years: 42.00  . Types: Cigarettes  . Quit date: 06/21/2011  . Years since quitting: 8.9  Smokeless Tobacco Current User  . Types: Chew    Labs: Recent Review Flowsheet Data    Labs for ITP Cardiac and Pulmonary Rehab Latest Ref Rng & Units 01/24/2020 01/24/2020 01/24/2020 01/25/2020 01/25/2020   PHART 7.35 - 7.45 7.255(L) 7.315(L) 7.371 7.417 7.439   PCO2ART 32 - 48 mmHg 62.6(H) 55.8(H) 41.5 38.5 35.2    HCO3 20.0 - 28.0 mmol/L 27.8 28.6(H) 24.1 24.7 24.0   TCO2 22 - 32 mmol/L 30 30 25 26 25    ACIDBASEDEF 0.0 - 2.0 mmol/L - - 1.0 - -   O2SAT % 98.0 100.0 94.0 97.0 95.0      Capillary Blood Glucose: Lab Results  Component Value Date   GLUCAP 92 01/05/2020     Pulmonary Assessment Scores:  Pulmonary Assessment Scores    Row Name 06/05/20 1128         ADL UCSD   ADL Phase Entry     SOB Score total 26       CAT Score   CAT Score 18       mMRC Score   mMRC Score 3           UCSD: Self-administered rating of dyspnea associated with activities of daily living (ADLs) 6-point scale (0 = "not at all" to 5 = "maximal or unable to do because of breathlessness")  Scoring Scores range from 0 to 120.  Minimally important difference is 5 units  CAT: CAT can identify the health impairment of COPD patients and is better correlated with disease progression.  CAT has a scoring range of zero to 40. The CAT score is classified into four groups of low (less than 10), medium (10 - 20), high (21-30) and very high (31-40) based on the impact level of disease on health status. A CAT score over 10 suggests significant symptoms.  A worsening CAT score could be explained by an exacerbation, poor medication adherence, poor inhaler technique, or progression of COPD or comorbid conditions.  CAT MCID is 2 points  mMRC: mMRC (Modified Medical Research Council) Dyspnea Scale is used to assess the degree of baseline functional disability in patients of respiratory disease due to dyspnea. No minimal important difference is established. A decrease in score of 1 point or greater is considered a positive change.   Pulmonary Function Assessment:  Pulmonary Function Assessment - 06/05/20 1059      Breath   Bilateral Breath Sounds Clear    Shortness of Breath Limiting activity;Yes           Exercise Target Goals: Exercise Program Goal: Individual exercise prescription set using results from initial 6  min walk test and THRR while considering  patient's activity barriers and safety.   Exercise Prescription Goal: Initial exercise prescription builds to 30-45 minutes a day of aerobic activity, 2-3 days per week.  Home exercise guidelines will be given to patient during program as part of exercise prescription that the participant will acknowledge.  Activity Barriers &  Risk Stratification:  Activity Barriers & Cardiac Risk Stratification - 06/05/20 1058      Activity Barriers & Cardiac Risk Stratification   Activity Barriers Deconditioning;Muscular Weakness;Shortness of Breath           6 Minute Walk:  6 Minute Walk    Row Name 06/05/20 1135         6 Minute Walk   Phase Initial     Distance 1293 feet     Walk Time 6 minutes     # of Rest Breaks 0     MPH 2.45     METS 2.73     RPE 9     Perceived Dyspnea  1     VO2 Peak 9.54     Symptoms Yes (comment)     Comments Mild SOB.     Resting HR 56 bpm     Resting BP 126/70     Resting Oxygen Saturation  97 %     Exercise Oxygen Saturation  during 6 min walk 93 %     Max Ex. HR 119 bpm     Max Ex. BP 118/56     2 Minute Post BP 112/60       Interval HR   1 Minute HR 108     2 Minute HR 116     3 Minute HR 119     4 Minute HR 106     5 Minute HR 114     6 Minute HR 95     2 Minute Post HR 66     Interval Heart Rate? Yes       Interval Oxygen   Interval Oxygen? Yes     Baseline Oxygen Saturation % 97 %     1 Minute Oxygen Saturation % 96 %     1 Minute Liters of Oxygen 0 L     2 Minute Oxygen Saturation % 95 %     2 Minute Liters of Oxygen 0 L     3 Minute Oxygen Saturation % 94 %     3 Minute Liters of Oxygen 0 L     4 Minute Oxygen Saturation % 94 %     4 Minute Liters of Oxygen 0 L     5 Minute Oxygen Saturation % 94 %     5 Minute Liters of Oxygen 0 L     6 Minute Oxygen Saturation % 93 %     6 Minute Liters of Oxygen 0 L     2 Minute Post Oxygen Saturation % 98 %     2 Minute Post Liters of Oxygen 0 L              Oxygen Initial Assessment:  Oxygen Initial Assessment - 06/05/20 1059      Home Oxygen   Home Oxygen Device None    Sleep Oxygen Prescription None    Home Exercise Oxygen Prescription None    Home at Rest Exercise Oxygen Prescription None      Initial 6 min Walk   Oxygen Used None      Program Oxygen Prescription   Program Oxygen Prescription None      Intervention   Short Term Goals To learn and exhibit compliance with exercise, home and travel O2 prescription;To learn and understand importance of maintaining oxygen saturations>88%;To learn and demonstrate proper use of respiratory medications;To learn and understand importance of monitoring SPO2 with pulse oximeter and demonstrate accurate use of the  pulse oximeter.;To learn and demonstrate proper pursed lip breathing techniques or other breathing techniques.;Other    Long  Term Goals Exhibits compliance with exercise, home and travel O2 prescription;Verbalizes importance of monitoring SPO2 with pulse oximeter and return demonstration;Maintenance of O2 saturations>88%;Exhibits proper breathing techniques, such as pursed lip breathing or other method taught during program session;Compliance with respiratory medication;Other           Oxygen Re-Evaluation:   Oxygen Discharge (Final Oxygen Re-Evaluation):   Initial Exercise Prescription:  Initial Exercise Prescription - 06/05/20 1100      Date of Initial Exercise RX and Referring Provider   Date 06/05/20    Referring Provider Grace Isaac, MD      Treadmill   MPH 2.2    Grade 0    Minutes 15    METs 2.68      NuStep   Level 3    SPM 85    Minutes 15    METs 2.6      Prescription Details   Frequency (times per week) 2    Duration Progress to 30 minutes of continuous aerobic without signs/symptoms of physical distress      Intensity   THRR 40-80% of Max Heartrate 60-119    Ratings of Perceived Exertion 11-13    Perceived Dyspnea 0-4       Progression   Progression Continue to progress workloads to maintain intensity without signs/symptoms of physical distress.      Resistance Training   Training Prescription Yes    Weight --   blue bands   Reps 10-15           Perform Capillary Blood Glucose checks as needed.  Exercise Prescription Changes:   Exercise Comments:   Exercise Goals and Review:  Exercise Goals    Row Name 06/05/20 1153             Exercise Goals   Increase Physical Activity Yes       Intervention Provide advice, education, support and counseling about physical activity/exercise needs.;Develop an individualized exercise prescription for aerobic and resistive training based on initial evaluation findings, risk stratification, comorbidities and participant's personal goals.       Expected Outcomes Short Term: Attend rehab on a regular basis to increase amount of physical activity.;Long Term: Exercising regularly at least 3-5 days a week.;Long Term: Add in home exercise to make exercise part of routine and to increase amount of physical activity.       Increase Strength and Stamina Yes       Intervention Provide advice, education, support and counseling about physical activity/exercise needs.;Develop an individualized exercise prescription for aerobic and resistive training based on initial evaluation findings, risk stratification, comorbidities and participant's personal goals.       Expected Outcomes Short Term: Increase workloads from initial exercise prescription for resistance, speed, and METs.;Short Term: Perform resistance training exercises routinely during rehab and add in resistance training at home;Long Term: Improve cardiorespiratory fitness, muscular endurance and strength as measured by increased METs and functional capacity (6MWT)       Able to understand and use rate of perceived exertion (RPE) scale Yes       Intervention Provide education and explanation on how to use RPE scale        Expected Outcomes Short Term: Able to use RPE daily in rehab to express subjective intensity level;Long Term:  Able to use RPE to guide intensity level when exercising independently  Able to understand and use Dyspnea scale Yes       Intervention Provide education and explanation on how to use Dyspnea scale       Expected Outcomes Short Term: Able to use Dyspnea scale daily in rehab to express subjective sense of shortness of breath during exertion;Long Term: Able to use Dyspnea scale to guide intensity level when exercising independently       Knowledge and understanding of Target Heart Rate Range (THRR) Yes       Intervention Provide education and explanation of THRR including how the numbers were predicted and where they are located for reference       Expected Outcomes Short Term: Able to state/look up THRR;Long Term: Able to use THRR to govern intensity when exercising independently;Short Term: Able to use daily as guideline for intensity in rehab       Understanding of Exercise Prescription Yes       Intervention Provide education, explanation, and written materials on patient's individual exercise prescription       Expected Outcomes Short Term: Able to explain program exercise prescription;Long Term: Able to explain home exercise prescription to exercise independently              Exercise Goals Re-Evaluation :   Discharge Exercise Prescription (Final Exercise Prescription Changes):   Nutrition:  Target Goals: Understanding of nutrition guidelines, daily intake of sodium 1500mg , cholesterol 200mg , calories 30% from fat and 7% or less from saturated fats, daily to have 5 or more servings of fruits and vegetables.  Biometrics:  Pre Biometrics - 06/05/20 1059      Pre Biometrics   Height 5' 8.5" (1.74 m)    Weight 93.4 kg    BMI (Calculated) 30.85            Nutrition Therapy Plan and Nutrition Goals:   Nutrition Assessments:   Nutrition Goals  Re-Evaluation:   Nutrition Goals Discharge (Final Nutrition Goals Re-Evaluation):   Psychosocial: Target Goals: Acknowledge presence or absence of significant depression and/or stress, maximize coping skills, provide positive support system. Participant is able to verbalize types and ability to use techniques and skills needed for reducing stress and depression.  Initial Review & Psychosocial Screening:  Initial Psych Review & Screening - 06/05/20 1105      Initial Review   Current issues with None Identified      Family Dynamics   Good Support System? Yes      Barriers   Psychosocial barriers to participate in program There are no identifiable barriers or psychosocial needs.      Screening Interventions   Interventions Encouraged to exercise           Quality of Life Scores:  Scores of 19 and below usually indicate a poorer quality of life in these areas.  A difference of  2-3 points is a clinically meaningful difference.  A difference of 2-3 points in the total score of the Quality of Life Index has been associated with significant improvement in overall quality of life, self-image, physical symptoms, and general health in studies assessing change in quality of life.  PHQ-9: Recent Review Flowsheet Data    Depression screen [facility] 2/9 06/05/2020   Decreased Interest 0   Down, Depressed, Hopeless 0   PHQ - 2 Score 0   Altered sleeping 0   Tired, decreased energy 0   Change in appetite 0   Feeling bad or failure about yourself  0   Trouble concentrating 0  Moving slowly or fidgety/restless 0   Suicidal thoughts 0   Difficult doing work/chores Not difficult at all     Interpretation of Total Score  Total Score Depression Severity:  1-4 = Minimal depression, 5-9 = Mild depression, 10-14 = Moderate depression, 15-19 = Moderately severe depression, 20-27 = Severe depression   Psychosocial Evaluation and Intervention:  Psychosocial Evaluation - 06/05/20 1105       Psychosocial Evaluation & Interventions   Interventions Encouraged to exercise with the program and follow exercise prescription    Comments No concerns identified at this time    Expected Outcomes For [place] to have no barriers or psychosocial concerns while participating in pulmonary rehab    Continue Psychosocial Services  No Follow up required           Psychosocial Re-Evaluation:   Psychosocial Discharge (Final Psychosocial Re-Evaluation):   Education: Education Goals: Education classes will be provided on a weekly basis, covering required topics. Participant will state understanding/return demonstration of topics presented.  Learning Barriers/Preferences:  Learning Barriers/Preferences - 06/05/20 1107      Learning Barriers/Preferences   Learning Barriers None    Learning Preferences Audio;Computer/Internet;Group Instruction;Individual Instruction;Pictoral;Skilled Demonstration;Verbal Instruction;Video;Written Material           Education Topics: Risk Factor Reduction:  -Group instruction that is supported by a PowerPoint presentation. Instructor discusses the definition of a risk factor, different risk factors for pulmonary disease, and how the heart and lungs work together.     Nutrition for Pulmonary Patient:  -Group instruction provided by PowerPoint slides, verbal discussion, and written materials to support subject matter. The instructor gives an explanation and review of healthy diet recommendations, which includes a discussion on weight management, recommendations for fruit and vegetable consumption, as well as protein, fluid, caffeine, fiber, sodium, sugar, and alcohol. Tips for eating when patients are short of breath are discussed.   Pursed Lip Breathing:  -Group instruction that is supported by demonstration and informational handouts. Instructor discusses the benefits of pursed lip and diaphragmatic breathing and detailed demonstration on how to preform both.      Oxygen Safety:  -Group instruction provided by PowerPoint, verbal discussion, and written material to support subject matter. There is an overview of "What is Oxygen" and "Why do we need it".  Instructor also reviews how to create a safe environment for oxygen use, the importance of using oxygen as prescribed, and the risks of noncompliance. There is a brief discussion on traveling with oxygen and resources the patient may utilize.   Oxygen Equipment:  -Group instruction provided by [facility] Staff utilizing handouts, written materials, and equipment demonstrations.   Signs and Symptoms:  -Group instruction provided by written material and verbal discussion to support subject matter. Warning signs and symptoms of infection, stroke, and heart attack are reviewed and when to call the physician/911 reinforced. Tips for preventing the spread of infection discussed.   Advanced Directives:  -Group instruction provided by verbal instruction and written material to support subject matter. Instructor reviews Advanced Directive laws and proper instruction for filling out document.   Pulmonary Video:  -Group video education that reviews the importance of medication and oxygen compliance, exercise, good nutrition, pulmonary hygiene, and pursed lip and diaphragmatic breathing for the pulmonary patient.   Exercise for the Pulmonary Patient:  -Group instruction that is supported by a PowerPoint presentation. Instructor discusses benefits of exercise, core components of exercise, frequency, duration, and intensity of an exercise routine, importance of utilizing pulse oximetry during exercise,  safety while exercising, and options of places to exercise outside of rehab.     Pulmonary Medications:  -Verbally interactive group education provided by instructor with focus on inhaled medications and proper administration.   Anatomy and Physiology of the Respiratory System and Intimacy:  -Group  instruction provided by PowerPoint, verbal discussion, and written material to support subject matter. Instructor reviews respiratory cycle and anatomical components of the respiratory system and their functions. Instructor also reviews differences in obstructive and restrictive respiratory diseases with examples of each. Intimacy, Sex, and Sexuality differences are reviewed with a discussion on how relationships can change when diagnosed with pulmonary disease. Common sexual concerns are reviewed.   MD DAY -A group question and answer session with a medical doctor that allows participants to ask questions that relate to their pulmonary disease state.   OTHER EDUCATION -Group or individual verbal, written, or video instructions that support the educational goals of the pulmonary rehab program.   Holiday Eating Survival Tips:  -Group instruction provided by PowerPoint slides, verbal discussion, and written materials to support subject matter. The instructor gives patients tips, tricks, and techniques to help them not only survive but enjoy the holidays despite the onslaught of food that accompanies the holidays.   Knowledge Questionnaire Score:  Knowledge Questionnaire Score - 06/05/20 1128      Knowledge Questionnaire Score   Pre Score 16/18           Core Components/Risk Factors/Patient Goals at Admission:  Personal Goals and Risk Factors at Admission - 06/05/20 1107      Core Components/Risk Factors/Patient Goals on Admission   Improve shortness of breath with ADL's Yes    Intervention Provide education, individualized exercise plan and daily activity instruction to help decrease symptoms of SOB with activities of daily living.    Expected Outcomes Short Term: Improve cardiorespiratory fitness to achieve a reduction of symptoms when performing ADLs;Long Term: Be able to perform more ADLs without symptoms or delay the onset of symptoms           Core Components/Risk  Factors/Patient Goals Review:   Goals and Risk Factor Review    Row Name 06/05/20 1107             Core Components/Risk Factors/Patient Goals Review   Personal Goals Review Increase knowledge of respiratory medications and ability to use respiratory devices properly.;Improve shortness of breath with ADL's;Develop more efficient breathing techniques such as purse lipped breathing and diaphragmatic breathing and practicing self-pacing with activity.              Core Components/Risk Factors/Patient Goals at Discharge (Final Review):   Goals and Risk Factor Review - 06/05/20 1107      Core Components/Risk Factors/Patient Goals Review   Personal Goals Review Increase knowledge of respiratory medications and ability to use respiratory devices properly.;Improve shortness of breath with ADL's;Develop more efficient breathing techniques such as purse lipped breathing and diaphragmatic breathing and practicing self-pacing with activity.           ITP Comments:   Comments:

## 2020-06-11 ENCOUNTER — Encounter (HOSPITAL_COMMUNITY)
Admission: RE | Admit: 2020-06-11 | Discharge: 2020-06-11 | Disposition: A | Discharge status: Home / Self Care | Payer: Medicare Other | Source: Ambulatory Visit | Attending: Pulmonary Disease | Admitting: Pulmonary Disease

## 2020-06-11 ENCOUNTER — Other Ambulatory Visit: Payer: Self-pay

## 2020-06-11 DIAGNOSIS — R0600 Dyspnea, unspecified: Secondary | ICD-10-CM | POA: Diagnosis not present

## 2020-06-11 NOTE — Progress Notes (Signed)
Daily Session Note  Patient Details  Name: Daniel Colon MRN: [id number] Date of Birth: 1946-01-13 Referring Provider:     Pulmonary Rehab Walk Test from 06/05/2020 in [place]  Referring Provider Grace Isaac, MD      Encounter Date: 06/11/2020  Check In:  Session Check In - 06/11/20 1445      Check-In   Supervising physician immediately available to respond to emergencies [facility] immediately available    Physician(s) Dr. Zigmund Daniel    Location [facility]    Staff Present Rosebud Poles, RN, Bjorn Loser, MS, CEP, Exercise Physiologist;Lisa Jani Gravel, MS, ACSM-CEP, Exercise Physiologist    Virtual Visit No    Medication changes reported     No    Fall or balance concerns reported    No    Tobacco Cessation No Change    Warm-up and Cool-down Performed on first and last piece of equipment    Resistance Training Performed Yes    VAD Patient? No    PAD/SET Patient? No      Pain Assessment   Currently in Pain? No/denies    Pain Score 0-No pain    Multiple Pain Sites No           Capillary Blood Glucose: No results found for this or any previous visit (from the past 24 hour(s)).    Social History   Tobacco Use  Smoking Status Former Smoker  . Packs/day: 1.00  . Years: 42.00  . Pack years: 42.00  . Types: Cigarettes  . Quit date: 06/21/2011  . Years since quitting: 8.9  Smokeless Tobacco Current User  . Types: Chew    Goals Met:  Proper associated with RPD/PD & O2 Sat Exercise tolerated well Strength training completed today  Goals Unmet:  Not Applicable  Comments: Service time is from 1345 to 1450   Dr. Fransico Him is Medical Director for Cardiac Rehab at [facility].

## 2020-06-13 ENCOUNTER — Other Ambulatory Visit: Payer: Self-pay

## 2020-06-13 ENCOUNTER — Encounter (HOSPITAL_COMMUNITY)
Admission: RE | Admit: 2020-06-13 | Discharge: 2020-06-13 | Disposition: A | Discharge status: Home / Self Care | Payer: Medicare Other | Source: Ambulatory Visit | Attending: Pulmonary Disease | Admitting: Pulmonary Disease

## 2020-06-13 VITALS — Wt 205.0 lb

## 2020-06-13 DIAGNOSIS — R0600 Dyspnea, unspecified: Secondary | ICD-10-CM | POA: Diagnosis not present

## 2020-06-13 DIAGNOSIS — Z902 Acquired absence of lung [part of]: Secondary | ICD-10-CM

## 2020-06-13 NOTE — Progress Notes (Signed)
Daily Session Note  Patient Details  Name: Daniel Colon MRN: [id number] Date of Birth: 1946/01/13 Referring Provider:     Pulmonary Rehab Walk Test from 06/05/2020 in [place]  Referring Provider Grace Isaac, MD      Encounter Date: 06/13/2020  Check In:  Session Check In - 06/13/20 1346      Check-In   Supervising physician immediately available to respond to emergencies [facility] immediately available    Physician(s) Dr. Zigmund Daniel    Location [facility]    Staff Present Rosebud Poles, RN, Bjorn Loser, MS, CEP, Exercise Physiologist;Lisa Jani Gravel, MS, ACSM-CEP, Exercise Physiologist    Virtual Visit No    Medication changes reported     No    Fall or balance concerns reported    No    Tobacco Cessation No Change    Warm-up and Cool-down Performed on first and last piece of equipment    Resistance Training Performed Yes    VAD Patient? No    PAD/SET Patient? No      Pain Assessment   Currently in Pain? No/denies    Pain Score 0-No pain    Multiple Pain Sites No           Capillary Blood Glucose: No results found for this or any previous visit (from the past 24 hour(s)).    Social History   Tobacco Use  Smoking Status Former Smoker  . Packs/day: 1.00  . Years: 42.00  . Pack years: 42.00  . Types: Cigarettes  . Quit date: 06/21/2011  . Years since quitting: 8.9  Smokeless Tobacco Current User  . Types: Chew    Goals Met:  Independence with exercise equipment Exercise tolerated well Strength training completed today  Goals Unmet:  Not Applicable  Comments: Service time is from 1343 to 1445    Dr. Kate Sable is Medical Director for [facility] and [facility].

## 2020-06-13 NOTE — Progress Notes (Signed)
Daniel Colon 74 y.o. male Nutrition Note  Visit Diagnosis: Dyspnea, unspecified type  Past Medical History:  Diagnosis Date  . Allergy   . Arthritis   . Barrett's esophagus   . CAD (coronary artery disease)    a. Stent to the [facility] (in [place]);  b. ETT(11/13/13): abnormal with early ST changes to suggest ischemia;   c. LHC (11/17/13):  pLAD 30-40, D1 30, mLAD 70-75, mCFX 70, mRCA stent 40 (ISR), EF 55-65%.  Medical Rx  . Cataract    left eye removed, right immature  . Colon polyps   . Dyslipidemia   . GERD (gastroesophageal reflux disease)   . Hypertension   . Myocardial infarction ([place]) 1996  . Nephrolithiasis    kidney stone  . PUD (peptic ulcer disease)      Medications reviewed.   Current Outpatient Medications:  .  acetaminophen (TYLENOL) 500 MG tablet, Take 500-1,000 mg by mouth every 6 (six) hours as needed (for pain.)., Disp: , Rfl:  .  aspirin EC 81 MG tablet, Take 81 mg by mouth 3 (three) times a week. In the morning, Disp: , Rfl:  .  atorvastatin (LIPITOR) 40 MG tablet, TAKE 1 TABLET DAILY, Disp: 90 tablet, Rfl: 3 .  BUDESONIDE-FORMOTEROL FUMARATE IN, Inhale into the lungs., Disp: , Rfl:  .  carvedilol (COREG) 12.5 MG tablet, Take 12.5 mg by mouth daily. , Disp: , Rfl:  .  Coenzyme Q10 (CO Q-10) 100 MG CAPS, Take 100 mg by mouth daily. , Disp: , Rfl:  .  docusate sodium (COLACE) 100 MG capsule, Take 100 mg by mouth daily as needed for mild constipation., Disp: , Rfl:  .  ezetimibe (ZETIA) 10 MG tablet, Take 10 mg by mouth daily., Disp: , Rfl:  .  fish oil-omega-3 fatty acids 1000 MG capsule, Take 1,000 mg by mouth daily. , Disp: , Rfl:  .  guaiFENesin (MUCINEX) 600 MG 12 hr tablet, Take 1 tablet (600 mg total) by mouth 2 (two) times daily as needed for cough or to loosen phlegm., Disp: , Rfl:  .  ipratropium (ATROVENT) 0.03 % nasal spray, Place 1 spray into both nostrils daily. , Disp: , Rfl:  .  Multiple Vitamin (MULTIVITAMIN WITH MINERALS) TABS tablet, Take 1  tablet by mouth daily., Disp: , Rfl:  .  nitroGLYCERIN (NITROLINGUAL) 0.4 MG/SPRAY spray, USE 1 SPRAY ON OR UNDER THE TONGUE EVERY 5 MINUTES FOR 3 DOSES AS NEEDED FOR CHEST PAIN AS DIRECTED BY PRESCRIBER OR PACKAGE INSTRUCTIONS, Disp: 24 g, Rfl: 3 .  omeprazole (PRILOSEC) 10 MG capsule, Take 10 mg by mouth daily., Disp: , Rfl:  .  oxybutynin (DITROPAN) 5 MG tablet, Take 5 mg by mouth daily. , Disp: , Rfl:  .  prochlorperazine (COMPAZINE) 10 MG tablet, Take 1 tablet (10 mg total) by mouth every 6 (six) hours as needed for nausea or vomiting., Disp: 30 tablet, Rfl: 0 .  senna (SENOKOT) 176 MG/5ML SYRP, Take by mouth., Disp: , Rfl:  .  Sennosides (SENNA LAXATIVE) 25 MG TABS, Take 1 tablet by mouth daily., Disp: , Rfl:  .  telmisartan-hydrochlorothiazide (MICARDIS HCT) 80-12.5 MG tablet, Take 1 tablet by mouth daily., Disp: 90 tablet, Rfl: 3 .  traMADol (ULTRAM) 50 MG tablet, Take 1 tablet (50 mg total) by mouth every 6 (six) hours as needed (mild pain)., Disp: 20 tablet, Rfl: 0   Ht Readings from Last 1 Encounters:  06/05/20 5' 8.5" (1.74 m)     Wt Readings from  Last 3 Encounters:  06/05/20 205 lb 14.6 oz (93.4 kg)  05/29/20 205 lb 12.8 oz (93.4 kg)  05/09/20 200 lb (90.7 kg)     There is no height or weight on file to calculate BMI.   Social History   Tobacco Use  Smoking Status Former Smoker  . Packs/day: 1.00  . Years: 42.00  . Pack years: 42.00  . Types: Cigarettes  . Quit date: 06/21/2011  . Years since quitting: 8.9  Smokeless Tobacco Current User  . Types: Chew      Nutrition Note  Spoke with pt. Nutrition Plan and Nutrition Survey goals reviewed with pt.  Pt states no difficulty preparing meals or eating. He has not had any unintentional weight loss. He lost taste and smell during his cancer treatment but he continued to maintain his intake. His appetite is getting better.  He tries to follow a heart healthy diet with his wife.  He eats breakfast and dinner and has a  snack between. He drinks only water and gatorade zero. He mentions wanting to lose about 10 lbs but he would prefer to do this on his own and work at it slowly. Pt expressed understanding of the information reviewed.    Nutrition Diagnosis ? Obese  I = 30-34.9 related to excessive energy intake as evidenced by a BMI of 30.72 kg/m2 Nutrition Intervention ? Pt's individual nutrition plan reviewed with pt. ? Benefits of adopting healthy diet reviewed with Rate My Plate survey ? Continue client-centered nutrition education by RD, as part of interdisciplinary care.  Goal(s) ? Pt to identify food quantities necessary to achieve weight loss of 6-24 lb at graduation from pulmonary rehab.   Plan:   Will provide client-centered nutrition education as part of interdisciplinary care  Monitor and evaluate progress toward nutrition goal with team.   Michaele Offer, MS, RDN, LDN

## 2020-06-18 ENCOUNTER — Other Ambulatory Visit: Payer: Self-pay

## 2020-06-18 ENCOUNTER — Encounter (HOSPITAL_COMMUNITY)
Admission: RE | Admit: 2020-06-18 | Discharge: 2020-06-18 | Disposition: A | Discharge status: Home / Self Care | Payer: Medicare Other | Source: Ambulatory Visit | Attending: Pulmonary Disease | Admitting: Pulmonary Disease

## 2020-06-18 VITALS — Wt 205.9 lb

## 2020-06-18 DIAGNOSIS — R0600 Dyspnea, unspecified: Secondary | ICD-10-CM | POA: Diagnosis not present

## 2020-06-18 NOTE — Progress Notes (Signed)
Daily Session Note  Patient Details  Name: Daniel Colon MRN: [id number] Date of Birth: 03-13-1946 Referring Provider:     Pulmonary Rehab Walk Test from 06/05/2020 in [place]  Referring Provider Grace Isaac, MD      Encounter Date: 06/18/2020  Check In:  Session Check In - 06/18/20 1517      Check-In   Supervising physician immediately available to respond to emergencies [facility] immediately available    Physician(s) Dr. Barth Kirks    [facility]    Staff Present Rosebud Poles, RN, Bjorn Loser, MS, CEP, Exercise Physiologist;Jessica Hassell Done, MS, ACSM-CEP, Exercise Physiologist;Lisa Ysidro Evert, RN    Virtual Visit No    Medication changes reported     No    Fall or balance concerns reported    No    Tobacco Cessation No Change    Warm-up and Cool-down Performed as group-led instruction    Resistance Training Performed Yes    VAD Patient? No    PAD/SET Patient? No      Pain Assessment   Currently in Pain? No/denies    Multiple Pain Sites No           Capillary Blood Glucose: No results found for this or any previous visit (from the past 24 hour(s)).   Exercise Prescription Changes - 06/18/20 1500      Response to Exercise   Blood Pressure (Admit) 130/86    Blood Pressure (Exercise) 114/56    Blood Pressure (Exit) 146/74    Heart Rate (Admit) 66 bpm    Heart Rate (Exercise) 98 bpm    Heart Rate (Exit) 71 bpm    Oxygen Saturation (Admit) 96 %    Oxygen Saturation (Exercise) 91 %    Oxygen Saturation (Exit) 98 %    Rating of Perceived Exertion (Exercise) 11    Perceived Dyspnea (Exercise) 1    Duration Continue with 30 min of aerobic exercise without signs/symptoms of physical distress.    Intensity --   40-80% HRR     Progression   Progression Continue to progress workloads to maintain intensity without signs/symptoms of physical distress.      Resistance Training   Training  Prescription Yes    Weight Blue Bands    Reps 10-15    Time 10 Minutes      Treadmill   MPH 2.2    Grade 0    Minutes 15      NuStep   Level 3    SPM 85    Minutes 15    METs 2.9           Social History   Tobacco Use  Smoking Status Former Smoker  . Packs/day: 1.00  . Years: 42.00  . Pack years: 42.00  . Types: Cigarettes  . Quit date: 06/21/2011  . Years since quitting: 9.0  Smokeless Tobacco Current User  . Types: Chew    Goals Met:  Proper associated with RPD/PD & O2 Sat Exercise tolerated well Strength training completed today  Goals Unmet:  Not Applicable  Comments: Service time is from 1345 to 1440    Dr. Fransico Him is Medical Director for Cardiac Rehab at [facility].

## 2020-06-20 ENCOUNTER — Other Ambulatory Visit: Payer: Self-pay

## 2020-06-20 ENCOUNTER — Encounter (HOSPITAL_COMMUNITY)
Admission: RE | Admit: 2020-06-20 | Discharge: 2020-06-20 | Disposition: A | Discharge status: Home / Self Care | Payer: Medicare Other | Source: Ambulatory Visit | Attending: Pulmonary Disease | Admitting: Pulmonary Disease

## 2020-06-20 DIAGNOSIS — R0600 Dyspnea, unspecified: Secondary | ICD-10-CM | POA: Insufficient documentation

## 2020-06-20 DIAGNOSIS — Z902 Acquired absence of lung [part of]: Secondary | ICD-10-CM | POA: Insufficient documentation

## 2020-06-20 NOTE — Progress Notes (Signed)
Daily Session Note  Patient Details  Name: Daniel Colon MRN: [id number] Date of Birth: 03/12/1946 Referring Provider:     Pulmonary Rehab Walk Test from 06/05/2020 in [place]  Referring Provider Grace Isaac, MD      Encounter Date: 06/20/2020  Check In:  Session Check In - 06/20/20 1421      Check-In   Supervising physician immediately available to respond to emergencies [facility] immediately available    Physician(s) Dr. Rodena Piety    [facility]    Staff Present Rosebud Poles, RN, Bjorn Loser, MS, CEP, Exercise Physiologist;Jessica Hassell Done, MS, ACSM-CEP, Exercise Physiologist;Lisa Ysidro Evert, RN    Virtual Visit No    Medication changes reported     No    Fall or balance concerns reported    No    Tobacco Cessation No Change    Warm-up and Cool-down Performed on first and last piece of equipment    Resistance Training Performed Yes    VAD Patient? No    PAD/SET Patient? No      Pain Assessment   Currently in Pain? No/denies    Pain Score 0-No pain    Multiple Pain Sites No           Capillary Blood Glucose: No results found for this or any previous visit (from the past 24 hour(s)).    Social History   Tobacco Use  Smoking Status Former Smoker  . Packs/day: 1.00  . Years: 42.00  . Pack years: 42.00  . Types: Cigarettes  . Quit date: 06/21/2011  . Years since quitting: 9.0  Smokeless Tobacco Current User  . Types: Chew    Goals Met:  Independence with exercise equipment Exercise tolerated well Strength training completed today  Goals Unmet:  Not Applicable  Comments: Service time is from 1350 to 1440    Dr. Fransico Him is Medical Director for Cardiac Rehab at [facility].

## 2020-06-25 ENCOUNTER — Other Ambulatory Visit: Payer: Self-pay

## 2020-06-25 ENCOUNTER — Encounter (HOSPITAL_COMMUNITY)
Admission: RE | Admit: 2020-06-25 | Discharge: 2020-06-25 | Disposition: A | Discharge status: Home / Self Care | Payer: Medicare Other | Source: Ambulatory Visit | Attending: Pulmonary Disease | Admitting: Pulmonary Disease

## 2020-06-25 DIAGNOSIS — R0600 Dyspnea, unspecified: Secondary | ICD-10-CM | POA: Diagnosis not present

## 2020-06-25 DIAGNOSIS — Z902 Acquired absence of lung [part of]: Secondary | ICD-10-CM | POA: Diagnosis not present

## 2020-06-25 NOTE — Progress Notes (Signed)
Daily Session Note  Patient Details  Name: Daniel Colon MRN: [id number] Date of Birth: 01-13-1946 Referring Provider:     Pulmonary Rehab Walk Test from 06/05/2020 in [place]  Referring Provider Grace Isaac, MD      Encounter Date: 06/25/2020  Check In:  Session Check In - 06/25/20 1358      Check-In   Supervising physician immediately available to respond to emergencies [facility] immediately available    Physician(s) Dr. Rodena Piety    [facility]    Staff Present Rosebud Poles, RN, BSN;Carlette Wilber Oliphant, RN, Roque Cash, RN    Virtual Visit No    Medication changes reported     No    Fall or balance concerns reported    No    Tobacco Cessation No Change    Warm-up and Cool-down Performed as group-led instruction    Resistance Training Performed Yes    VAD Patient? No    PAD/SET Patient? No      Pain Assessment   Currently in Pain? No/denies    Multiple Pain Sites No           Capillary Blood Glucose: No results found for this or any previous visit (from the past 24 hour(s)).    Social History   Tobacco Use  Smoking Status Former Smoker   Packs/day: 1.00   Years: 42.00   Pack years: 42.00   Types: Cigarettes   Quit date: 06/21/2011   Years since quitting: 9.0  Smokeless Tobacco Current User   Types: Chew    Goals Met:  Proper associated with RPD/PD & O2 Sat Exercise tolerated well Strength training completed today  Goals Unmet:  Not Applicable  Comments: Service time is from 1345 to Daisy    Dr. Fransico Him is Medical Director for Cardiac Rehab at [facility].

## 2020-06-27 ENCOUNTER — Other Ambulatory Visit: Payer: Self-pay

## 2020-06-27 ENCOUNTER — Encounter (HOSPITAL_COMMUNITY)
Admission: RE | Admit: 2020-06-27 | Discharge: 2020-06-27 | Disposition: A | Discharge status: Home / Self Care | Payer: Medicare Other | Source: Ambulatory Visit | Attending: Pulmonary Disease | Admitting: Pulmonary Disease

## 2020-06-27 DIAGNOSIS — Z902 Acquired absence of lung [part of]: Secondary | ICD-10-CM

## 2020-06-27 DIAGNOSIS — R0600 Dyspnea, unspecified: Secondary | ICD-10-CM | POA: Diagnosis not present

## 2020-06-27 NOTE — Progress Notes (Signed)
Daily Session Note  Patient Details  Name: Daniel Colon MRN: [id number] Date of Birth: [birth year] Referring Provider:     Pulmonary Rehab Walk Test from 06/05/2020 in [place]  Referring Provider Grace Isaac, MD      Encounter Date: 06/27/2020  Check In:  Session Check In - 06/27/20 1408      Check-In   Supervising physician immediately available to respond to emergencies [facility] immediately available    Physician(s) Dr. Sloan Leiter    [facility]    Staff Present Rosebud Poles, RN, Bjorn Loser, MS, CEP, Exercise Physiologist;Lisa Ysidro Evert, RN    Virtual Visit No    Medication changes reported     No    Fall or balance concerns reported    No    Tobacco Cessation No Change    Warm-up and Cool-down Performed as group-led instruction    Resistance Training Performed Yes    VAD Patient? No    PAD/SET Patient? No      Pain Assessment   Currently in Pain? No/denies    Multiple Pain Sites No           Capillary Blood Glucose: No results found for this or any previous visit (from the past 24 hour(s)).   Exercise Prescription Changes - 06/27/20 1400      Home Exercise Plan   Plans to continue exercise at Home (comment)    Frequency Add 4 additional days to program exercise sessions.    Initial Home Exercises Provided 06/27/20           Social History   Tobacco Use  Smoking Status Former Smoker   Packs/day: 1.00   Years: 42.00   Pack years: 42.00   Types: Cigarettes   Quit date: 06/21/2011   Years since quitting: 9.0  Smokeless Tobacco Current User   Types: Chew    Goals Met:  Proper associated with RPD/PD & O2 Sat Exercise tolerated well Strength training completed today  Goals Unmet:  Not Applicable  Comments: Service time is from 1345 to 86    Dr. Fransico Him is Medical Director for Cardiac Rehab at [facility].

## 2020-06-27 NOTE — Progress Notes (Signed)
I have reviewed a Home Exercise Prescription with Daniel Colon is  currently exercising at home.  The patient was advised to walk and play golf 5-7 days a week for 60-90 minutes.  Daniel Colon and I discussed how to progress their exercise prescription.  The patient stated that their goals were to live another 20 years.  The patient stated that they understand the exercise prescription.  We reviewed exercise guidelines, target heart rate during exercise, RPE Scale, weather conditions, NTG use, endpoints for exercise, warmup and cool down.  Patient is encouraged to come to me with any questions. I will continue to follow up with the patient to assist them with progression and safety.

## 2020-07-02 ENCOUNTER — Encounter (HOSPITAL_COMMUNITY)
Admission: RE | Admit: 2020-07-02 | Discharge: 2020-07-02 | Disposition: A | Discharge status: Home / Self Care | Payer: Medicare Other | Source: Ambulatory Visit | Attending: Pulmonary Disease | Admitting: Pulmonary Disease

## 2020-07-02 ENCOUNTER — Other Ambulatory Visit: Payer: Self-pay

## 2020-07-02 VITALS — Wt 205.7 lb

## 2020-07-02 DIAGNOSIS — R0600 Dyspnea, unspecified: Secondary | ICD-10-CM

## 2020-07-02 DIAGNOSIS — Z902 Acquired absence of lung [part of]: Secondary | ICD-10-CM | POA: Diagnosis not present

## 2020-07-02 NOTE — Progress Notes (Signed)
Daily Session Note  Patient Details  Name: Daniel Colon MRN: [id number] Date of Birth: 04/09/1946 Referring Provider:     Pulmonary Rehab Walk Test from 06/05/2020 in [place]  Referring Provider Grace Isaac, MD      Encounter Date: 07/02/2020  Check In:  Session Check In - 07/02/20 1417      Check-In   Supervising physician immediately available to respond to emergencies [facility] immediately available    Physician(s) Dr. Sloan Leiter    [facility]    Staff Present Rosebud Poles, RN, Bjorn Loser, MS, CEP, Exercise Physiologist    Virtual Visit No    Medication changes reported     No    Fall or balance concerns reported    No    Warm-up and Cool-down Performed as group-led instruction    Resistance Training Performed Yes    VAD Patient? No    PAD/SET Patient? No      Pain Assessment   Currently in Pain? No/denies    Multiple Pain Sites No           Capillary Blood Glucose: No results found for this or any previous visit (from the past 24 hour(s)).   Exercise Prescription Changes - 07/02/20 1400      Response to Exercise   Blood Pressure (Admit) 124/70    Blood Pressure (Exercise) 146/58    Blood Pressure (Exit) 110/56    Heart Rate (Admit) 64 bpm    Heart Rate (Exercise) 89 bpm    Heart Rate (Exit) 70 bpm    Oxygen Saturation (Admit) 97 %    Oxygen Saturation (Exercise) 91 %    Oxygen Saturation (Exit) 98 %    Rating of Perceived Exertion (Exercise) 12    Perceived Dyspnea (Exercise) 1.5    Duration Continue with 30 min of aerobic exercise without signs/symptoms of physical distress.    Intensity THRR unchanged      Progression   Progression Continue to progress workloads to maintain intensity without signs/symptoms of physical distress.      Resistance Training   Training Prescription Yes    Weight blue bands    Reps 10-15    Time 10 Minutes      Treadmill   MPH 2.5     Grade 1    Minutes 15      NuStep   Level 4    SPM 85    Minutes 15    METs 3.7           Social History   Tobacco Use  Smoking Status Former Smoker  . Packs/day: 1.00  . Years: 42.00  . Pack years: 42.00  . Types: Cigarettes  . Quit date: 06/21/2011  . Years since quitting: 9.0  Smokeless Tobacco Current User  . Types: Chew    Goals Met:  Proper associated with RPD/PD & O2 Sat Exercise tolerated well Strength training completed today  Goals Unmet:  Not Applicable  Comments: Service time is from 1350 to 1450.    Dr. Fransico Him is Medical Director for Cardiac Rehab at [facility].

## 2020-07-03 ENCOUNTER — Encounter: Payer: Self-pay | Admitting: Pulmonary Disease

## 2020-07-03 ENCOUNTER — Ambulatory Visit (INDEPENDENT_AMBULATORY_CARE_PROVIDER_SITE_OTHER): Payer: Medicare Other | Admitting: Pulmonary Disease

## 2020-07-03 VITALS — BP 116/62 | HR 61 | Temp 98.2°F | Ht 68.5 in | Wt 202.4 lb

## 2020-07-03 DIAGNOSIS — R0600 Dyspnea, unspecified: Secondary | ICD-10-CM

## 2020-07-03 DIAGNOSIS — R0609 Other forms of dyspnea: Secondary | ICD-10-CM

## 2020-07-03 DIAGNOSIS — J449 Chronic obstructive pulmonary disease, unspecified: Secondary | ICD-10-CM | POA: Diagnosis not present

## 2020-07-03 NOTE — Patient Instructions (Signed)
We will schedule you for pulmonary function test Continue respiratory inhaler and pulmonary rehab Follow-up in 3 months.

## 2020-07-03 NOTE — Progress Notes (Addendum)
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 Status post right upper lobectomy with lymph node dissection  Complains of dyspnea on exertion for the past few months.  No symptoms at rest.  Has occasional cough with mucus production.  No fevers, chills Has started on Bevespi couple of months ago with some improvement in symptoms.  Pets: 2 cats.  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  No ongoing exposures with mold, hot tub, Jacuzzi.  No down pillows or comforters Smoking history: 40-pack-year smoker.  Quit in 2012 Travel history: Originally from [place].  No significant recent travel Relevant family history: No significant family history of lung disease  Outpatient Encounter Medications as of 07/03/2020  Medication Sig  . acetaminophen (TYLENOL) 500 MG tablet Take 500-1,000 mg by mouth every 6 (six) hours as needed (for pain.).  Marland Kitchen aspirin EC 81 MG tablet Take 81 mg by mouth 3 (three) times a week. In the morning  . atorvastatin (LIPITOR) 40 MG tablet TAKE 1 TABLET DAILY  . BREZTRI AEROSPHERE 160-9-4.8 MCG/ACT AERO   . BUDESONIDE-FORMOTEROL FUMARATE IN Inhale into the lungs.  . carvedilol (COREG) 12.5 MG tablet Take 12.5 mg by mouth daily.   . Coenzyme Q10 (CO Q-10) 100 MG CAPS Take 100 mg by mouth daily.   Marland Kitchen docusate sodium (COLACE) 100 MG capsule Take 100 mg by mouth daily as needed for mild constipation.  Marland Kitchen ezetimibe (ZETIA) 10 MG tablet Take 10 mg by mouth daily.  . fish oil-omega-3 fatty acids 1000 MG capsule Take 1,000 mg by mouth daily.   Marland Kitchen  guaiFENesin (MUCINEX) 600 MG 12 hr tablet Take 1 tablet (600 mg total) by mouth 2 (two) times daily as needed for cough or to loosen phlegm.  Marland Kitchen ipratropium (ATROVENT) 0.03 % nasal spray Place 1 spray into both nostrils daily.   . Multiple Vitamin (MULTIVITAMIN WITH MINERALS) TABS tablet Take 1 tablet by mouth daily.  . nitroGLYCERIN (NITROLINGUAL) 0.4 MG/SPRAY spray USE 1 SPRAY ON OR UNDER THE TONGUE EVERY 5 MINUTES FOR 3 DOSES AS NEEDED FOR CHEST PAIN AS DIRECTED BY PRESCRIBER OR PACKAGE INSTRUCTIONS  . omeprazole (PRILOSEC) 10 MG capsule Take 10 mg by mouth daily.  Marland Kitchen oxybutynin (DITROPAN) 5 MG tablet Take 5 mg by mouth daily.   . prochlorperazine (COMPAZINE) 10 MG tablet Take 1 tablet (10 mg total) by mouth every 6 (six) hours as needed for nausea or vomiting.  . senna (SENOKOT) 176 MG/5ML SYRP Take by mouth.  . Sennosides (SENNA LAXATIVE) 25 MG TABS Take 1 tablet by mouth daily.  Marland Kitchen telmisartan-hydrochlorothiazide (MICARDIS HCT) 80-12.5 MG tablet Take 1 tablet by mouth daily.  . traMADol (ULTRAM) 50 MG tablet Take 1 tablet (50 mg total) by mouth every 6 (six) hours as needed (mild pain).   No facility-administered encounter medications on file as of 07/03/2020.    Allergies as of 07/03/2020  . (No Known Allergies)    Past Medical History:  Diagnosis Date  . Allergy   . Arthritis   . Barrett's esophagus   . CAD (coronary  artery disease)    a. Stent to the [place] (in [place]);  b. ETT(11/13/13): abnormal with early ST changes to suggest ischemia;   c. LHC (11/17/13):  pLAD 30-40, D1 30, mLAD 70-75, mCFX 70, mRCA stent 40 (ISR), EF 55-65%.  Medical Rx  . Cataract    left eye removed, right immature  . Colon polyps   . Dyslipidemia   . GERD (gastroesophageal reflux disease)   . Hypertension   . Myocardial infarction ([place]) 1996  . Nephrolithiasis    kidney stone  . PUD (peptic ulcer disease)     Past Surgical History:  Procedure Laterality Date  . ANGIOPLASTY     stent - rt cor.  art  . BILROTH II PROCEDURE    . COLONOSCOPY    . INTERCOSTAL NERVE BLOCK Right 01/24/2020   Procedure: Intercostal Nerve Block;  Surgeon: Grace Isaac, MD;  Location: [facility];  Service: Thoracic;  Laterality: Right;  . LEFT HEART CATHETERIZATION WITH CORONARY ANGIOGRAM N/A 11/17/2013   Procedure: LEFT HEART CATHETERIZATION WITH CORONARY ANGIOGRAM;  Surgeon: Blane Ohara, MD;  Location[facility] C[facility] LAB;  Service: Cardiovascular;  Laterality: N/A;  . LYMPH NODE DISSECTION N/A 01/24/2020   Procedure: LYMPH NODE DISSECTION;  Surgeon: Grace Isaac, MD;  Location: [place];  Service: Thoracic;  Laterality: N/A;  . POLYPECTOMY    . ulcer surgery    . ULNAR NERVE TRANSPOSITION Right 08/05/2017   Procedure: RIGHT ULNAR NERVE DECOMPRESSION;  Surgeon: Leanora Cover, MD;  Location: [place];  Service: Orthopedics;  Laterality: Right;  Marland Kitchen VIDEO BRONCHOSCOPY N/A 01/24/2020   Procedure: VIDEO BRONCHOSCOPY;  Surgeon: Grace Isaac, MD;  Location: [facility] OR;  Service: Thoracic;  Laterality: N/A;    Family History  Problem Relation Age of Onset  . Sudden death Mother 58  . CAD Brother 32       CABG  . CAD Sister 61       CABG  . CAD Sister 33       CABG  . Cancer Brother   . Liver disease Other   . Colon cancer Neg Hx   . Colon polyps Neg Hx   . Esophageal cancer Neg Hx   . Rectal cancer Neg Hx   . Stomach cancer Neg Hx     Social History   Socioeconomic History  . Marital status: Married    Spouse name: Not on file  . Number of children: 1  . Years of education: Not on file  . Highest education level: Not on file  Occupational History  . Not on file  Tobacco Use  . Smoking status: Former Smoker    Packs/day: 1.00    Years: 42.00    Pack years: 42.00    Types: Cigarettes    Quit date: 06/21/2011    Years since quitting: 9.0  . Smokeless tobacco: Current User    Types: Chew  Vaping Use  . Vaping Use: Never used  Substance and Sexual Activity  . Alcohol use: Yes      Alcohol/week: 0.0 standard drinks    Comment: social  . Drug use: No  . Sexual activity: Not on file  Other Topics Concern  . Not on file  Social History Narrative   Lives with wife and mother in law.    Social Determinants of Health   Financial Resource Strain:   . Difficulty of Paying Living Expenses:   Food Insecurity:   . Worried About Estate manager/land agent  of Food in the Last Year:   . [place] in the Last Year:   Transportation Needs:   . Lack of Transportation (Medical):   Marland Kitchen Lack of Transportation (Non-Medical):   Physical Activity:   . Days of Exercise per Week:   . Minutes of Exercise per Session:   Stress:   . Feeling of Stress :   Social Connections:   . Frequency of Communication with Friends and Family:   . Frequency of Social Gatherings with Friends and Family:   . Attends Religious Services:   . Active Member of Clubs or Organizations:   . Attends Archivist Meetings:   Marland Kitchen Marital Status:   Intimate Partner Violence:   . Fear of Current or Ex-Partner:   . Emotionally Abused:   Marland Kitchen Physically Abused:   . Sexually Abused:     Review of systems: Review of Systems  Constitutional: Negative for fever and chills.  HENT: Negative.   Eyes: Negative for blurred vision.  Respiratory: as per HPI  Cardiovascular: Negative for chest pain and palpitations.  Gastrointestinal: Negative for vomiting, diarrhea, blood per rectum. Genitourinary: Negative for dysuria, urgency, frequency and hematuria.  Musculoskeletal: Negative for myalgias, back pain and joint pain.  Skin: Negative for itching and rash.  Neurological: Negative for dizziness, tremors, focal weakness, seizures and loss of consciousness.  Endo/Heme/Allergies: Negative for environmental allergies.  Psychiatric/Behavioral: Negative for depression, suicidal ideas and hallucinations.  All other systems reviewed and are negative.  Physical Exam: Blood pressure 116/62, pulse 61, temperature 98.2 F  (36.8 C), temperature source Oral, height 5' 8.5" (1.74 m), weight 202 lb 6.4 oz (91.8 kg), SpO2 99 %. Gen:      No acute distress HEENT:  EOMI, sclera anicteric Neck:     No masses; no thyromegaly Lungs:    Clear to auscultation bilaterally; normal respiratory effort CV:         Regular rate and rhythm; no murmurs Abd:      + bowel sounds; soft, non-tender; no palpable masses, no distension Ext:    No edema; adequate peripheral perfusion Skin:      Warm and dry; no rash Neuro: alert and oriented x 3 Psych: normal mood and affect  Data Reviewed: Imaging: CT chest 05/27/2020-s/p right upper lobectomy with loculated fluid, right adrenal adenoma, emphysema.  I have reviewed the images personally.  PFTs: 01/09/2020 FVC 4.11 [97%], FEV1 2.86 [92%], F/F 70, TLC 6.37 [93%], DLCO 18.01 [72%] Mild obstruction, mild diffusion impairment  Labs: CBC 05/22/2020-WBC 6.1, eos 1%, absolute eosinophil count 62  Assessment:  COPD Lung cancer status post lobectomy Has some dyspnea on exertion likely secondary to underlying COPD and possibly new restriction after his lobectomy Recently started on Bevespi.  Continue the same Continue on pulmonary rehab Recheck PFTs for reevaluation of lung function  Plan/Recommendations: Gatha Mayer, pulmonary rehab PFTs  Marshell Garfinkel MD [facility] 07/03/2020, 1:46 PM  CC: Magdalen Spatz, NP

## 2020-07-04 ENCOUNTER — Encounter (HOSPITAL_COMMUNITY)
Admission: RE | Admit: 2020-07-04 | Discharge: 2020-07-04 | Disposition: A | Discharge status: Home / Self Care | Payer: Medicare Other | Source: Ambulatory Visit | Attending: Pulmonary Disease | Admitting: Pulmonary Disease

## 2020-07-04 ENCOUNTER — Other Ambulatory Visit: Payer: Self-pay

## 2020-07-04 DIAGNOSIS — R0600 Dyspnea, unspecified: Secondary | ICD-10-CM

## 2020-07-04 DIAGNOSIS — Z902 Acquired absence of lung [part of]: Secondary | ICD-10-CM | POA: Diagnosis not present

## 2020-07-04 NOTE — Progress Notes (Signed)
Daily Session Note  Patient Details  Name: Daniel Colon MRN: [id number] Date of Birth: [birth year] Referring Provider:     Pulmonary Rehab Walk Test from 06/05/2020 in [place]  Referring Provider Grace Isaac, MD      Encounter Date: 07/04/2020  Check In:  Session Check In - 07/04/20 1426      Check-In   Supervising physician immediately available to respond to emergencies [facility] immediately available    Physician(s) Dr. Sloan Leiter    Location [facility]    Staff Present Rosebud Poles, RN, Bjorn Loser, MS, CEP, Exercise Physiologist;Bonnee Zertuche Jani Gravel, MS, ACSM-CEP, Exercise Physiologist    Virtual Visit No    Medication changes reported     No    Fall or balance concerns reported    No    Tobacco Cessation No Change    Warm-up and Cool-down Performed as group-led instruction    Resistance Training Performed Yes    VAD Patient? No    PAD/SET Patient? No      Pain Assessment   Currently in Pain? No/denies    Multiple Pain Sites No           Capillary Blood Glucose: No results found for this or any previous visit (from the past 24 hour(s)).    Social History   Tobacco Use  Smoking Status Former Smoker  . Packs/day: 1.00  . Years: 42.00  . Pack years: 42.00  . Types: Cigarettes  . Quit date: 06/21/2011  . Years since quitting: 9.0  Smokeless Tobacco Current User  . Types: Chew    Goals Met:  Exercise tolerated well No report of cardiac concerns or symptoms Strength training completed today  Goals Unmet:  Not Applicable  Comments: Service time is from 1400 to 43    Dr. Fransico Him is Medical Director for Cardiac Rehab at [facility].

## 2020-07-04 NOTE — Progress Notes (Signed)
Pulmonary Individual Treatment Plan  Patient Details  Name: Daniel Colon MRN: [id number] Date of Birth: [birth year] Referring Provider:     Pulmonary Rehab Walk Test from 06/05/2020 in [place]  Referring Provider Grace Isaac, MD      Initial Encounter Date:    Pulmonary Rehab Walk Test from 06/05/2020 in [place]  Date 06/05/20      Visit Diagnosis: Dyspnea, unspecified type  Patient's Home Medications on Admission:   Current Outpatient Medications:  .  acetaminophen (TYLENOL) 500 MG tablet, Take 500-1,000 mg by mouth every 6 (six) hours as needed (for pain.)., Disp: , Rfl:  .  aspirin EC 81 MG tablet, Take 81 mg by mouth 3 (three) times a week. In the morning, Disp: , Rfl:  .  atorvastatin (LIPITOR) 40 MG tablet, TAKE 1 TABLET DAILY, Disp: 90 tablet, Rfl: 3 .  BREZTRI AEROSPHERE 160-9-4.8 MCG/ACT AERO, , Disp: , Rfl:  .  BUDESONIDE-FORMOTEROL FUMARATE IN, Inhale into the lungs., Disp: , Rfl:  .  carvedilol (COREG) 12.5 MG tablet, Take 12.5 mg by mouth daily. , Disp: , Rfl:  .  Coenzyme Q10 (CO Q-10) 100 MG CAPS, Take 100 mg by mouth daily. , Disp: , Rfl:  .  docusate sodium (COLACE) 100 MG capsule, Take 100 mg by mouth daily as needed for mild constipation., Disp: , Rfl:  .  ezetimibe (ZETIA) 10 MG tablet, Take 10 mg by mouth daily., Disp: , Rfl:  .  fish oil-omega-3 fatty acids 1000 MG capsule, Take 1,000 mg by mouth daily. , Disp: , Rfl:  .  guaiFENesin (MUCINEX) 600 MG 12 hr tablet, Take 1 tablet (600 mg total) by mouth 2 (two) times daily as needed for cough or to loosen phlegm., Disp: , Rfl:  .  ipratropium (ATROVENT) 0.03 % nasal spray, Place 1 spray into both nostrils daily. , Disp: , Rfl:  .  Multiple Vitamin (MULTIVITAMIN WITH MINERALS) TABS tablet, Take 1 tablet by mouth daily., Disp: , Rfl:  .  nitroGLYCERIN (NITROLINGUAL) 0.4 MG/SPRAY spray, USE 1 SPRAY ON OR UNDER THE TONGUE EVERY 5 MINUTES FOR  3 DOSES AS NEEDED FOR CHEST PAIN AS DIRECTED BY PRESCRIBER OR PACKAGE INSTRUCTIONS, Disp: 24 g, Rfl: 3 .  omeprazole (PRILOSEC) 10 MG capsule, Take 10 mg by mouth daily., Disp: , Rfl:  .  oxybutynin (DITROPAN) 5 MG tablet, Take 5 mg by mouth daily. , Disp: , Rfl:  .  prochlorperazine (COMPAZINE) 10 MG tablet, Take 1 tablet (10 mg total) by mouth every 6 (six) hours as needed for nausea or vomiting., Disp: 30 tablet, Rfl: 0 .  senna (SENOKOT) 176 MG/5ML SYRP, Take by mouth., Disp: , Rfl:  .  Sennosides (SENNA LAXATIVE) 25 MG TABS, Take 1 tablet by mouth daily., Disp: , Rfl:  .  telmisartan-hydrochlorothiazide (MICARDIS HCT) 80-12.5 MG tablet, Take 1 tablet by mouth daily., Disp: 90 tablet, Rfl: 3 .  traMADol (ULTRAM) 50 MG tablet, Take 1 tablet (50 mg total) by mouth every 6 (six) hours as needed (mild pain)., Disp: 20 tablet, Rfl: 0  Past Medical History: Past Medical History:  Diagnosis Date  . Allergy   . Arthritis   . Barrett's esophagus   . CAD (coronary artery disease)    a. Stent to the [facility] (in [place]);  b. ETT(11/13/13): abnormal with early ST changes to suggest ischemia;   c. LHC (11/17/13):  pLAD 30-40, D1 30, mLAD 70-75, mCFX 70,  mRCA stent 40 (ISR), EF 55-65%.  Medical Rx  . Cataract    left eye removed, right immature  . Colon polyps   . Dyslipidemia   . GERD (gastroesophageal reflux disease)   . Hypertension   . Myocardial infarction ([place]) 1996  . Nephrolithiasis    kidney stone  . PUD (peptic ulcer disease)     Tobacco Use: Social History   Tobacco Use  Smoking Status Former Smoker  . Packs/day: 1.00  . Years: 42.00  . Pack years: 42.00  . Types: Cigarettes  . Quit date: 06/21/2011  . Years since quitting: 9.0  Smokeless Tobacco Current User  . Types: Chew    Labs: Recent Review Flowsheet Data    Labs for ITP Cardiac and Pulmonary Rehab Latest Ref Rng & Units 01/24/2020 01/24/2020 01/24/2020 01/25/2020 01/25/2020   PHART 7.35 - 7.45 7.255(L) 7.315(L) 7.371 7.417  7.439   PCO2ART 32 - 48 mmHg 62.6(H) 55.8(H) 41.5 38.5 35.2   HCO3 20.0 - 28.0 mmol/L 27.8 28.6(H) 24.1 24.7 24.0   TCO2 22 - 32 mmol/L _0 ACIDBASEDEF 0.0 - 2.0 mmol/L - - 1.0 - -   O2SAT % 98.0 100.0 94.0 97.0 95.0      Capillary Blood Glucose: Lab Results  Component Value Date   GLUCAP 92 01/05/2020     Pulmonary Assessment Scores:  Pulmonary Assessment Scores    Row Name 06/05/20 1128         ADL UCSD   ADL Phase Entry     SOB Score total 26       CAT Score   CAT Score 18       mMRC Score   mMRC Score 3           UCSD: Self-administered rating of dyspnea associated with activities of daily living (ADLs) 6-point scale (0 = "not at all" to 5 = "maximal or unable to do because of breathlessness")  Scoring Scores range from 0 to 120.  Minimally important difference is 5 units  CAT: CAT can identify the health impairment of COPD patients and is better correlated with disease progression.  CAT has a scoring range of zero to 40. The CAT score is classified into four groups of low (less than 10), medium (10 - 20), high (21-30) and very high (31-40) based on the impact level of disease on health status. A CAT score over 10 suggests significant symptoms.  A worsening CAT score could be explained by an exacerbation, poor medication adherence, poor inhaler technique, or progression of COPD or comorbid conditions.  CAT MCID is 2 points  mMRC: mMRC (Modified Medical Research Council) Dyspnea Scale is used to assess the degree of baseline functional disability in patients of respiratory disease due to dyspnea. No minimal important difference is established. A decrease in score of 1 point or greater is considered a positive change.   Pulmonary Function Assessment:  Pulmonary Function Assessment - 06/05/20 1059      Breath   Bilateral Breath Sounds Clear    Shortness of Breath Limiting activity;Yes           Exercise Target Goals: Exercise Program  Goal: Individual exercise prescription set using results from initial 6 min walk test and THRR while considering  patient's activity barriers and safety.   Exercise Prescription Goal: Initial exercise prescription builds to 30-45 minutes a day of aerobic activity, 2-3 days per week.  Home exercise guidelines will be given to patient during  program as part of exercise prescription that the participant will acknowledge.  Activity Barriers & Risk Stratification:  Activity Barriers & Cardiac Risk Stratification - 06/05/20 1058      Activity Barriers & Cardiac Risk Stratification   Activity Barriers Deconditioning;Muscular Weakness;Shortness of Breath           6 Minute Walk:  6 Minute Walk    Row Name 06/05/20 1135         6 Minute Walk   Phase Initial     Distance 1293 feet     Walk Time 6 minutes     # of Rest Breaks 0     MPH 2.45     METS 2.73     RPE 9     Perceived Dyspnea  1     VO2 Peak 9.54     Symptoms Yes (comment)     Comments Mild SOB.     Resting HR 56 bpm     Resting BP 126/70     Resting Oxygen Saturation  97 %     Exercise Oxygen Saturation  during 6 min walk 93 %     Max Ex. HR 119 bpm     Max Ex. BP 118/56     2 Minute Post BP 112/60       Interval HR   1 Minute HR 108     2 Minute HR 116     3 Minute HR 119     4 Minute HR 106     5 Minute HR 114     6 Minute HR 95     2 Minute Post HR 66     Interval Heart Rate? Yes       Interval Oxygen   Interval Oxygen? Yes     Baseline Oxygen Saturation % 97 %     1 Minute Oxygen Saturation % 96 %     1 Minute Liters of Oxygen 0 L     2 Minute Oxygen Saturation % 95 %     2 Minute Liters of Oxygen 0 L     3 Minute Oxygen Saturation % 94 %     3 Minute Liters of Oxygen 0 L     4 Minute Oxygen Saturation % 94 %     4 Minute Liters of Oxygen 0 L     5 Minute Oxygen Saturation % 94 %     5 Minute Liters of Oxygen 0 L     6 Minute Oxygen Saturation % 93 %     6 Minute Liters of Oxygen 0 L     2 Minute  Post Oxygen Saturation % 98 %     2 Minute Post Liters of Oxygen 0 L            Oxygen Initial Assessment:  Oxygen Initial Assessment - 06/05/20 1059      Home Oxygen   Home Oxygen Device None    Sleep Oxygen Prescription None    Home Exercise Oxygen Prescription None    Home at Rest Exercise Oxygen Prescription None      Initial 6 min Walk   Oxygen Used None      Program Oxygen Prescription   Program Oxygen Prescription None      Intervention   Short Term Goals To learn and exhibit compliance with exercise, home and travel O2 prescription;To learn and understand importance of maintaining oxygen saturations>88%;To learn and demonstrate proper use of respiratory medications;To learn and  understand importance of monitoring SPO2 with pulse oximeter and demonstrate accurate use of the pulse oximeter.;To learn and demonstrate proper pursed lip breathing techniques or other breathing techniques.;Other    Long  Term Goals Exhibits compliance with exercise, home and travel O2 prescription;Verbalizes importance of monitoring SPO2 with pulse oximeter and return demonstration;Maintenance of O2 saturations>88%;Exhibits proper breathing techniques, such as pursed lip breathing or other method taught during program session;Compliance with respiratory medication;Other           Oxygen Re-Evaluation:  Oxygen Re-Evaluation    Row Name 07/02/20 0803             Program Oxygen Prescription   Program Oxygen Prescription None         Home Oxygen   Home Oxygen Device None       Sleep Oxygen Prescription None       Home Exercise Oxygen Prescription None       Home at Rest Exercise Oxygen Prescription None       Compliance with Home Oxygen Use Yes         Goals/Expected Outcomes   Short Term Goals To learn and exhibit compliance with exercise, home and travel O2 prescription;To learn and understand importance of maintaining oxygen saturations>88%;To learn and demonstrate proper use of  respiratory medications;To learn and understand importance of monitoring SPO2 with pulse oximeter and demonstrate accurate use of the pulse oximeter.;To learn and demonstrate proper pursed lip breathing techniques or other breathing techniques.;Other       Long  Term Goals Exhibits compliance with exercise, home and travel O2 prescription;Verbalizes importance of monitoring SPO2 with pulse oximeter and return demonstration;Maintenance of O2 saturations>88%;Exhibits proper breathing techniques, such as pursed lip breathing or other method taught during program session;Compliance with respiratory medication;Other       Goals/Expected Outcomes compliance              Oxygen Discharge (Final Oxygen Re-Evaluation):  Oxygen Re-Evaluation - 07/02/20 0803      Program Oxygen Prescription   Program Oxygen Prescription None      Home Oxygen   Home Oxygen Device None    Sleep Oxygen Prescription None    Home Exercise Oxygen Prescription None    Home at Rest Exercise Oxygen Prescription None    Compliance with Home Oxygen Use Yes      Goals/Expected Outcomes   Short Term Goals To learn and exhibit compliance with exercise, home and travel O2 prescription;To learn and understand importance of maintaining oxygen saturations>88%;To learn and demonstrate proper use of respiratory medications;To learn and understand importance of monitoring SPO2 with pulse oximeter and demonstrate accurate use of the pulse oximeter.;To learn and demonstrate proper pursed lip breathing techniques or other breathing techniques.;Other    Long  Term Goals Exhibits compliance with exercise, home and travel O2 prescription;Verbalizes importance of monitoring SPO2 with pulse oximeter and return demonstration;Maintenance of O2 saturations>88%;Exhibits proper breathing techniques, such as pursed lip breathing or other method taught during program session;Compliance with respiratory medication;Other    Goals/Expected Outcomes  compliance           Initial Exercise Prescription:  Initial Exercise Prescription - 06/05/20 1100      Date of Initial Exercise RX and Referring Provider   Date 06/05/20    Referring Provider Grace Isaac, MD      Treadmill   MPH 2.2    Grade 0    Minutes 15    METs 2.68      NuStep  Level 3    SPM 85    Minutes 15    METs 2.6      Prescription Details   Frequency (times per week) 2    Duration Progress to 30 minutes of continuous aerobic without signs/symptoms of physical distress      Intensity   THRR 40-80% of Max Heartrate 60-119    Ratings of Perceived Exertion 11-13    Perceived Dyspnea 0-4      Progression   Progression Continue to progress workloads to maintain intensity without signs/symptoms of physical distress.      Resistance Training   Training Prescription Yes    Weight --   blue bands   Reps 10-15           Perform Capillary Blood Glucose checks as needed.  Exercise Prescription Changes:  Exercise Prescription Changes    Row Name 06/18/20 1500 06/27/20 1400 07/02/20 1400         Response to Exercise   Blood Pressure (Admit) 130/86 -- 124/70     Blood Pressure (Exercise) 114/56 -- 146/58     Blood Pressure (Exit) 146/74 -- 110/56     Heart Rate (Admit) 66 bpm -- 64 bpm     Heart Rate (Exercise) 98 bpm -- 89 bpm     Heart Rate (Exit) 71 bpm -- 70 bpm     Oxygen Saturation (Admit) 96 % -- 97 %     Oxygen Saturation (Exercise) 91 % -- 91 %     Oxygen Saturation (Exit) 98 % -- 98 %     Rating of Perceived Exertion (Exercise) 11 -- 12     Perceived Dyspnea (Exercise) 1 -- 1.5     Duration Continue with 30 min of aerobic exercise without signs/symptoms of physical distress. -- Continue with 30 min of aerobic exercise without signs/symptoms of physical distress.     Intensity --  40-80% HRR -- THRR unchanged       Progression   Progression Continue to progress workloads to maintain intensity without signs/symptoms of physical  distress. -- Continue to progress workloads to maintain intensity without signs/symptoms of physical distress.       Resistance Training   Training Prescription Yes -- Yes     Weight Blue Bands -- blue bands     Reps 10-15 -- 10-15     Time 10 Minutes -- 10 Minutes       Treadmill   MPH 2.2 -- 2.5     Grade 0 -- 1     Minutes 15 -- 15       NuStep   Level 3 -- 4     SPM 85 -- 85     Minutes 15 -- 15     METs 2.9 -- 3.7       Home Exercise Plan   Plans to continue exercise at -- Home (comment) --     Frequency -- Add 4 additional days to program exercise sessions. --     Initial Home Exercises Provided -- 06/27/20 --            Exercise Comments:  Exercise Comments    Row Name 06/27/20 1429           Exercise Comments home exercise reviewed              Exercise Goals and Review:  Exercise Goals    Row Name 06/05/20 1153 07/02/20 3568  Exercise Goals   Increase Physical Activity Yes Yes      Intervention Provide advice, education, support and counseling about physical activity/exercise needs.;Develop an individualized exercise prescription for aerobic and resistive training based on initial evaluation findings, risk stratification, comorbidities and participant's personal goals. Provide advice, education, support and counseling about physical activity/exercise needs.;Develop an individualized exercise prescription for aerobic and resistive training based on initial evaluation findings, risk stratification, comorbidities and participant's personal goals.      Expected Outcomes Short Term: Attend rehab on a regular basis to increase amount of physical activity.;Long Term: Exercising regularly at least 3-5 days a week.;Long Term: Add in home exercise to make exercise part of routine and to increase amount of physical activity. Short Term: Attend rehab on a regular basis to increase amount of physical activity.;Long Term: Exercising regularly at least 3-5 days a  week.;Long Term: Add in home exercise to make exercise part of routine and to increase amount of physical activity.      Increase Strength and Stamina Yes Yes      Intervention Provide advice, education, support and counseling about physical activity/exercise needs.;Develop an individualized exercise prescription for aerobic and resistive training based on initial evaluation findings, risk stratification, comorbidities and participant's personal goals. Provide advice, education, support and counseling about physical activity/exercise needs.;Develop an individualized exercise prescription for aerobic and resistive training based on initial evaluation findings, risk stratification, comorbidities and participant's personal goals.      Expected Outcomes Short Term: Increase workloads from initial exercise prescription for resistance, speed, and METs.;Short Term: Perform resistance training exercises routinely during rehab and add in resistance training at home;Long Term: Improve cardiorespiratory fitness, muscular endurance and strength as measured by increased METs and functional capacity (6MWT) Short Term: Increase workloads from initial exercise prescription for resistance, speed, and METs.;Short Term: Perform resistance training exercises routinely during rehab and add in resistance training at home;Long Term: Improve cardiorespiratory fitness, muscular endurance and strength as measured by increased METs and functional capacity (6MWT)      Able to understand and use rate of perceived exertion (RPE) scale Yes Yes      Intervention Provide education and explanation on how to use RPE scale Provide education and explanation on how to use RPE scale      Expected Outcomes Short Term: Able to use RPE daily in rehab to express subjective intensity level;Long Term:  Able to use RPE to guide intensity level when exercising independently Short Term: Able to use RPE daily in rehab to express subjective intensity  level;Long Term:  Able to use RPE to guide intensity level when exercising independently      Able to understand and use Dyspnea scale Yes Yes      Intervention Provide education and explanation on how to use Dyspnea scale Provide education and explanation on how to use Dyspnea scale      Expected Outcomes Short Term: Able to use Dyspnea scale daily in rehab to express subjective sense of shortness of breath during exertion;Long Term: Able to use Dyspnea scale to guide intensity level when exercising independently Short Term: Able to use Dyspnea scale daily in rehab to express subjective sense of shortness of breath during exertion;Long Term: Able to use Dyspnea scale to guide intensity level when exercising independently      Knowledge and understanding of Target Heart Rate Range (THRR) Yes Yes      Intervention Provide education and explanation of THRR including how the numbers were predicted and where they are  located for reference Provide education and explanation of THRR including how the numbers were predicted and where they are located for reference      Expected Outcomes Short Term: Able to state/look up THRR;Long Term: Able to use THRR to govern intensity when exercising independently;Short Term: Able to use daily as guideline for intensity in rehab Short Term: Able to state/look up THRR;Long Term: Able to use THRR to govern intensity when exercising independently;Short Term: Able to use daily as guideline for intensity in rehab      Understanding of Exercise Prescription Yes Yes      Intervention Provide education, explanation, and written materials on patient's individual exercise prescription Provide education, explanation, and written materials on patient's individual exercise prescription      Expected Outcomes Short Term: Able to explain program exercise prescription;Long Term: Able to explain home exercise prescription to exercise independently Short Term: Able to explain program exercise  prescription;Long Term: Able to explain home exercise prescription to exercise independently             Exercise Goals Re-Evaluation :  Exercise Goals Re-Evaluation    Row Name 07/02/20 0804             Exercise Goal Re-Evaluation   Exercise Goals Review Increase Physical Activity;Increase Strength and Stamina;Able to understand and use rate of perceived exertion (RPE) scale;Able to understand and use Dyspnea scale;Knowledge and understanding of Target Heart Rate Range (THRR);Understanding of Exercise Prescription       Comments Pt has completed 6 exercise sessions. He is extremely active and exercises everyday. He is progressing well and is a pleasure to be around. He currently exercises at 3.5 METs on the stepper. Will continue to monitor and progress as able.       Expected Outcomes Through exercise at rehab and at home, the patient will decrease shortness of breath with daily activities and feel confident in carrying out an exercise regime at home.              Discharge Exercise Prescription (Final Exercise Prescription Changes):  Exercise Prescription Changes - 07/02/20 1400      Response to Exercise   Blood Pressure (Admit) 124/70    Blood Pressure (Exercise) 146/58    Blood Pressure (Exit) 110/56    Heart Rate (Admit) 64 bpm    Heart Rate (Exercise) 89 bpm    Heart Rate (Exit) 70 bpm    Oxygen Saturation (Admit) 97 %    Oxygen Saturation (Exercise) 91 %    Oxygen Saturation (Exit) 98 %    Rating of Perceived Exertion (Exercise) 12    Perceived Dyspnea (Exercise) 1.5    Duration Continue with 30 min of aerobic exercise without signs/symptoms of physical distress.    Intensity THRR unchanged      Progression   Progression Continue to progress workloads to maintain intensity without signs/symptoms of physical distress.      Resistance Training   Training Prescription Yes    Weight blue bands    Reps 10-15    Time 10 Minutes      Treadmill   MPH 2.5    Grade 1     Minutes 15      NuStep   Level 4    SPM 85    Minutes 15    METs 3.7           Nutrition:  Target Goals: Understanding of nutrition guidelines, daily intake of sodium '1500mg'$ , cholesterol '200mg'$ , calories 30% from fat  and 7% or less from saturated fats, daily to have 5 or more servings of fruits and vegetables.  Biometrics:  Pre Biometrics - 06/05/20 1211      Pre Biometrics   Grip Strength 33 kg            Nutrition Therapy Plan and Nutrition Goals:  Nutrition Therapy & Goals - 06/13/20 1436      Nutrition Therapy   Diet Heart Healthy      Personal Nutrition Goals   Nutrition Goal Pt to identify food quantities necessary to achieve weight loss of 6-24 lb at graduation from pulmonary rehab.      Intervention Plan   Intervention Prescribe, educate and counsel regarding individualized specific dietary modifications aiming towards targeted core components such as weight, hypertension, lipid management, diabetes, heart failure and other comorbidities.    Expected Outcomes Short Term Goal: Understand basic principles of dietary content, such as calories, fat, sodium, cholesterol and nutrients.           Nutrition Assessments:   Nutrition Goals Re-Evaluation:  Nutrition Goals Re-Evaluation    [place] Name 06/13/20 1436 07/03/20 1020           Goals   Current Weight 205 lb (93 kg) 205 lb 11 oz (93.3 kg)      Nutrition Goal -- Pt to identify food quantities necessary to achieve weight loss of 6-24 lb at graduation from pulmonary rehab.      Comment -- Pt has been working on this goal prior to rehab and prefers to continue without support of RD.             Nutrition Goals Discharge (Final Nutrition Goals Re-Evaluation):  Nutrition Goals Re-Evaluation - 07/03/20 1020      Goals   Current Weight 205 lb 11 oz (93.3 kg)    Nutrition Goal Pt to identify food quantities necessary to achieve weight loss of 6-24 lb at graduation from pulmonary rehab.    Comment Pt  has been working on this goal prior to rehab and prefers to continue without support of RD.           Psychosocial: Target Goals: Acknowledge presence or absence of significant depression and/or stress, maximize coping skills, provide positive support system. Participant is able to verbalize types and ability to use techniques and skills needed for reducing stress and depression.  Initial Review & Psychosocial Screening:  Initial Psych Review & Screening - 06/05/20 1105      Initial Review   Current issues with None Identified      Family Dynamics   Good Support System? Yes      Barriers   Psychosocial barriers to participate in program There are no identifiable barriers or psychosocial needs.      Screening Interventions   Interventions Encouraged to exercise           Quality of Life Scores:  Scores of 19 and below usually indicate a poorer quality of life in these areas.  A difference of  2-3 points is a clinically meaningful difference.  A difference of 2-3 points in the total score of the Quality of Life Index has been associated with significant improvement in overall quality of life, self-image, physical symptoms, and general health in studies assessing change in quality of life.  PHQ-9: Recent Review Flowsheet Data    Depression screen [facility] 2/9 06/05/2020   Decreased Interest 0   Down, Depressed, Hopeless 0   PHQ - 2 Score 0  Altered sleeping 0   Tired, decreased energy 0   Change in appetite 0   Feeling bad or failure about yourself  0   Trouble concentrating 0   Moving slowly or fidgety/restless 0   Suicidal thoughts 0   Difficult doing work/chores Not difficult at all     Interpretation of Total Score  Total Score Depression Severity:  1-4 = Minimal depression, 5-9 = Mild depression, 10-14 = Moderate depression, 15-19 = Moderately severe depression, 20-27 = Severe depression   Psychosocial Evaluation and Intervention:  Psychosocial Evaluation - 06/05/20  1105      Psychosocial Evaluation & Interventions   Interventions Encouraged to exercise with the program and follow exercise prescription    Comments No concerns identified at this time    Expected Outcomes For Joie to have no barriers or psychosocial concerns while participating in pulmonary rehab    Continue Psychosocial Services  No Follow up required           Psychosocial Re-Evaluation:  Psychosocial Re-Evaluation    [place] Name 07/02/20 [phone]             Psychosocial Re-Evaluation   Current issues with None Identified       Comments No psychosocial concerns have been identified at this time.       Expected Outcomes For Nareg to continue to be free of psychosocial concerns while participating in pulmonary rehab.       Interventions Encouraged to attend Pulmonary Rehabilitation for the exercise       Continue Psychosocial Services  No Follow up required              Psychosocial Discharge (Final Psychosocial Re-Evaluation):  Psychosocial Re-Evaluation - 07/02/20 9604      Psychosocial Re-Evaluation   Current issues with None Identified    Comments No psychosocial concerns have been identified at this time.    Expected Outcomes For Babe to continue to be free of psychosocial concerns while participating in pulmonary rehab.    Interventions Encouraged to attend Pulmonary Rehabilitation for the exercise    Continue Psychosocial Services  No Follow up required           Education: Education Goals: Education classes will be provided on a weekly basis, covering required topics. Participant will state understanding/return demonstration of topics presented.  Learning Barriers/Preferences:  Learning Barriers/Preferences - 06/05/20 1107      Learning Barriers/Preferences   Learning Barriers None    Learning Preferences Audio;Computer/Internet;Group Instruction;Individual Instruction;Pictoral;Skilled Demonstration;Verbal Instruction;Video;Written Material            Education Topics: Risk Factor Reduction:  -Group instruction that is supported by a PowerPoint presentation. Instructor discusses the definition of a risk factor, different risk factors for pulmonary disease, and how the heart and lungs work together.     PULMONARY REHAB OTHER RESPIRATORY from 06/27/2020 in [place]  Date 06/27/20  Educator handout      Nutrition for Pulmonary Patient:  -Group instruction provided by PowerPoint slides, verbal discussion, and written materials to support subject matter. The instructor gives an explanation and review of healthy diet recommendations, which includes a discussion on weight management, recommendations for fruit and vegetable consumption, as well as protein, fluid, caffeine, fiber, sodium, sugar, and alcohol. Tips for eating when patients are short of breath are discussed.   Pursed Lip Breathing:  -Group instruction that is supported by demonstration and informational handouts. Instructor discusses the benefits of pursed lip and diaphragmatic breathing and  detailed demonstration on how to preform both.     PULMONARY REHAB OTHER RESPIRATORY from 06/11/2020 in [place]  Date 06/11/20  Educator Handout      Oxygen Safety:  -Group instruction provided by PowerPoint, verbal discussion, and written material to support subject matter. There is an overview of "What is Oxygen" and "Why do we need it".  Instructor also reviews how to create a safe environment for oxygen use, the importance of using oxygen as prescribed, and the risks of noncompliance. There is a brief discussion on traveling with oxygen and resources the patient may utilize.   Oxygen Equipment:  -Group instruction provided by [facility] Staff utilizing handouts, written materials, and equipment demonstrations.   Signs and Symptoms:  -Group instruction provided by written material and verbal discussion to support  subject matter. Warning signs and symptoms of infection, stroke, and heart attack are reviewed and when to call the physician/911 reinforced. Tips for preventing the spread of infection discussed.   Advanced Directives:  -Group instruction provided by verbal instruction and written material to support subject matter. Instructor reviews Advanced Directive laws and proper instruction for filling out document.   Pulmonary Video:  -Group video education that reviews the importance of medication and oxygen compliance, exercise, good nutrition, pulmonary hygiene, and pursed lip and diaphragmatic breathing for the pulmonary patient.   Exercise for the Pulmonary Patient:  -Group instruction that is supported by a PowerPoint presentation. Instructor discusses benefits of exercise, core components of exercise, frequency, duration, and intensity of an exercise routine, importance of utilizing pulse oximetry during exercise, safety while exercising, and options of places to exercise outside of rehab.     Pulmonary Medications:  -Verbally interactive group education provided by instructor with focus on inhaled medications and proper administration.   Anatomy and Physiology of the Respiratory System and Intimacy:  -Group instruction provided by PowerPoint, verbal discussion, and written material to support subject matter. Instructor reviews respiratory cycle and anatomical components of the respiratory system and their functions. Instructor also reviews differences in obstructive and restrictive respiratory diseases with examples of each. Intimacy, Sex, and Sexuality differences are reviewed with a discussion on how relationships can change when diagnosed with pulmonary disease. Common sexual concerns are reviewed.   MD DAY -A group question and answer session with a medical doctor that allows participants to ask questions that relate to their pulmonary disease state.   OTHER EDUCATION -Group or  individual verbal, written, or video instructions that support the educational goals of the pulmonary rehab program.   PULMONARY REHAB OTHER RESPIRATORY from 06/27/2020 in [place]  Date 06/18/20  Educator handout  [Know Your Numbers]      Holiday Eating Survival Tips:  -Group instruction provided by PowerPoint slides, verbal discussion, and written materials to support subject matter. The instructor gives patients tips, tricks, and techniques to help them not only survive but enjoy the holidays despite the onslaught of food that accompanies the holidays.   Knowledge Questionnaire Score:  Knowledge Questionnaire Score - 06/05/20 1128      Knowledge Questionnaire Score   Pre Score 16/18           Core Components/Risk Factors/Patient Goals at Admission:  Personal Goals and Risk Factors at Admission - 06/05/20 1107      Core Components/Risk Factors/Patient Goals on Admission   Improve shortness of breath with ADL's Yes    Intervention Provide education, individualized exercise plan and daily activity instruction  to help decrease symptoms of SOB with activities of daily living.    Expected Outcomes Short Term: Improve cardiorespiratory fitness to achieve a reduction of symptoms when performing ADLs;Long Term: Be able to perform more ADLs without symptoms or delay the onset of symptoms           Core Components/Risk Factors/Patient Goals Review:   Goals and Risk Factor Review    Row Name 06/05/20 1107 07/02/20 0925           Core Components/Risk Factors/Patient Goals Review   Personal Goals Review Increase knowledge of respiratory medications and ability to use respiratory devices properly.;Improve shortness of breath with ADL's;Develop more efficient breathing techniques such as purse lipped breathing and diaphragmatic breathing and practicing self-pacing with activity. Increase knowledge of respiratory medications and ability to use respiratory  devices properly.;Improve shortness of breath with ADL's;Develop more efficient breathing techniques such as purse lipped breathing and diaphragmatic breathing and practicing self-pacing with activity.      Review -- Has progressed very well, he plays 18 holes of golf before coming to exercise and is on level 4 of the nustep @ 3.5 mets, and walking on the treadmill @ 2.5 mph and no incline.      Expected Outcomes -- See admission goals.             Core Components/Risk Factors/Patient Goals at Discharge (Final Review):   Goals and Risk Factor Review - 07/02/20 0925      Core Components/Risk Factors/Patient Goals Review   Personal Goals Review Increase knowledge of respiratory medications and ability to use respiratory devices properly.;Improve shortness of breath with ADL's;Develop more efficient breathing techniques such as purse lipped breathing and diaphragmatic breathing and practicing self-pacing with activity.    Review Has progressed very well, he plays 18 holes of golf before coming to exercise and is on level 4 of the nustep @ 3.5 mets, and walking on the treadmill @ 2.5 mph and no incline.    Expected Outcomes See admission goals.           ITP Comments:   Comments: ITP REVIEW Pt is making expected progress toward pulmonary rehab goals after completing 7 sessions. Recommend continued exercise, life style modification, education, and utilization of breathing techniques to increase stamina and strength and decrease shortness of breath with exertion.

## 2020-07-09 ENCOUNTER — Encounter (HOSPITAL_COMMUNITY)
Admission: RE | Admit: 2020-07-09 | Discharge: 2020-07-09 | Disposition: A | Discharge status: Home / Self Care | Payer: Medicare Other | Source: Ambulatory Visit | Attending: Pulmonary Disease | Admitting: Pulmonary Disease

## 2020-07-09 ENCOUNTER — Other Ambulatory Visit: Payer: Self-pay

## 2020-07-09 DIAGNOSIS — R0600 Dyspnea, unspecified: Secondary | ICD-10-CM | POA: Diagnosis not present

## 2020-07-09 DIAGNOSIS — Z902 Acquired absence of lung [part of]: Secondary | ICD-10-CM | POA: Diagnosis not present

## 2020-07-09 NOTE — Progress Notes (Signed)
Daily Session Note  Patient Details  Name: Daniel Colon MRN: [id number] Date of Birth: Nov 15, 1946 Referring Provider:     Pulmonary Rehab Walk Test from 06/05/2020 in [place]  Referring Provider Grace Isaac, MD      Encounter Date: 07/09/2020  Check In:  Session Check In - 07/09/20 1423      Check-In   Supervising physician immediately available to respond to emergencies [facility] immediately available    Physician(s) Dr. Pietro Cassis    Location [facility]    Staff Present Rosebud Poles, RN, Bjorn Loser, MS, CEP, Exercise Physiologist;Djuana L[place]leton Jani Gravel, MS, ACSM-CEP, Exercise Physiologist    Virtual Visit No    Medication changes reported     No    Fall or balance concerns reported    No    Tobacco Cessation No Change    Warm-up and Cool-down Performed as group-led instruction    Resistance Training Performed Yes    VAD Patient? No    PAD/SET Patient? No      Pain Assessment   Currently in Pain? No/denies    Multiple Pain Sites No           Capillary Blood Glucose: No results found for this or any previous visit (from the past 24 hour(s)).    Social History   Tobacco Use  Smoking Status Former Smoker  . Packs/day: 1.00  . Years: 42.00  . Pack years: 42.00  . Types: Cigarettes  . Quit date: 06/21/2011  . Years since quitting: 9.0  Smokeless Tobacco Current User  . Types: Chew    Goals Met:  Exercise tolerated well No report of cardiac concerns or symptoms Strength training completed today  Goals Unmet:  Not Applicable  Comments: Service time is from 1335 to 1437    Dr. Fransico Him is Medical Director for Cardiac Rehab at [facility].

## 2020-07-11 ENCOUNTER — Other Ambulatory Visit: Payer: Self-pay

## 2020-07-11 ENCOUNTER — Encounter (HOSPITAL_COMMUNITY)
Admission: RE | Admit: 2020-07-11 | Discharge: 2020-07-11 | Disposition: A | Discharge status: Home / Self Care | Payer: Medicare Other | Source: Ambulatory Visit | Attending: Pulmonary Disease | Admitting: Pulmonary Disease

## 2020-07-11 DIAGNOSIS — R0600 Dyspnea, unspecified: Secondary | ICD-10-CM | POA: Diagnosis not present

## 2020-07-11 DIAGNOSIS — Z902 Acquired absence of lung [part of]: Secondary | ICD-10-CM | POA: Diagnosis not present

## 2020-07-11 NOTE — Progress Notes (Signed)
Daily Session Note  Patient Details  Name: Daniel Colon MRN: [id number] Date of Birth: 1946-03-04 Referring Provider:     Pulmonary Rehab Walk Test from 06/05/2020 in [place]  Referring Provider Grace Isaac, MD      Encounter Date: 07/11/2020  Check In:  Session Check In - 07/11/20 1425      Check-In   Supervising physician immediately available to respond to emergencies [facility] immediately available    Physician(s) Dr. Pietro Cassis    [facility]    Staff Present Hoy Register, MS, CEP, Exercise Physiologist;Lisa Jani Gravel, MS, ACSM-CEP, Exercise Physiologist    Virtual Visit No    Medication changes reported     No    Fall or balance concerns reported    No    Tobacco Cessation No Change    Warm-up and Cool-down Performed as group-led instruction    Resistance Training Performed Yes    VAD Patient? No    PAD/SET Patient? No      Pain Assessment   Currently in Pain? No/denies    Pain Score 0-No pain    Multiple Pain Sites No           Capillary Blood Glucose: No results found for this or any previous visit (from the past 24 hour(s)).    Social History   Tobacco Use  Smoking Status Former Smoker  . Packs/day: 1.00  . Years: 42.00  . Pack years: 42.00  . Types: Cigarettes  . Quit date: 06/21/2011  . Years since quitting: 9.0  Smokeless Tobacco Current User  . Types: Chew    Goals Met:  Proper associated with RPD/PD & O2 Sat Independence with exercise equipment Exercise tolerated well Strength training completed today  Goals Unmet:  Not Applicable  Comments: Service time is from 1345 to 1450    Dr. Fransico Him is Medical Director for Cardiac Rehab at [facility].

## 2020-07-15 ENCOUNTER — Other Ambulatory Visit: Payer: Self-pay | Admitting: Cardiology

## 2020-07-16 ENCOUNTER — Other Ambulatory Visit: Payer: Self-pay

## 2020-07-16 ENCOUNTER — Encounter (HOSPITAL_COMMUNITY)
Admission: RE | Admit: 2020-07-16 | Discharge: 2020-07-16 | Disposition: A | Discharge status: Home / Self Care | Payer: Medicare Other | Source: Ambulatory Visit | Attending: Pulmonary Disease | Admitting: Pulmonary Disease

## 2020-07-16 VITALS — Wt 206.6 lb

## 2020-07-16 DIAGNOSIS — R0600 Dyspnea, unspecified: Secondary | ICD-10-CM | POA: Diagnosis not present

## 2020-07-16 DIAGNOSIS — Z902 Acquired absence of lung [part of]: Secondary | ICD-10-CM | POA: Diagnosis not present

## 2020-07-16 NOTE — Progress Notes (Signed)
Daily Session Note  Patient Details  Name: Daniel Colon MRN: [id number] Date of Birth: 1946/08/26 Referring Provider:     Pulmonary Rehab Walk Test from 06/05/2020 in [place]  Referring Provider Grace Isaac, MD      Encounter Date: 07/16/2020  Check In:  Session Check In - 07/16/20 1414      Check-In   Supervising physician immediately available to respond to emergencies [facility] immediately available    Physician(s) Dr. Jamse Arn    [facility]    Staff Present Hoy Register, MS, CEP, Exercise Physiologist;Naela Nodal Jani Gravel, MS, ACSM-CEP, Exercise Physiologist    Virtual Visit No    Medication changes reported     No    Fall or balance concerns reported    No    Tobacco Cessation No Change    Warm-up and Cool-down Performed on first and last piece of equipment    Resistance Training Performed Yes    VAD Patient? No    PAD/SET Patient? No      Pain Assessment   Currently in Pain? No/denies    Pain Score 0-No pain    Multiple Pain Sites No           Capillary Blood Glucose: No results found for this or any previous visit (from the past 24 hour(s)).   Exercise Prescription Changes - 07/16/20 1400      Response to Exercise   Blood Pressure (Admit) 134/62    Blood Pressure (Exercise) 150/64    Blood Pressure (Exit) 102/60    Heart Rate (Admit) 62 bpm    Heart Rate (Exercise) 93 bpm    Heart Rate (Exit) 73 bpm    Oxygen Saturation (Admit) 98 %    Oxygen Saturation (Exercise) 89 %    Oxygen Saturation (Exit) 99 %    Rating of Perceived Exertion (Exercise) 12.5    Perceived Dyspnea (Exercise) 1.5    Duration Continue with 30 min of aerobic exercise without signs/symptoms of physical distress.    Intensity THRR unchanged      Progression   Progression Continue to progress workloads to maintain intensity without signs/symptoms of physical distress.      Resistance Training    Training Prescription Yes    Weight blue bands    Reps 10-15    Time 10 Minutes      Treadmill   MPH 2.7    Grade 1    Minutes 15      NuStep   Level 5    SPM 85    Minutes 15    METs 4.3           Social History   Tobacco Use  Smoking Status Former Smoker  . Packs/day: 1.00  . Years: 42.00  . Pack years: 42.00  . Types: Cigarettes  . Quit date: 06/21/2011  . Years since quitting: 9.0  Smokeless Tobacco Current User  . Types: Chew    Goals Met:  Exercise tolerated well No report of cardiac concerns or symptoms Strength training completed today  Goals Unmet:  Not Applicable  Comments: Service time is from 1345 to 1445    Dr. Fransico Him is Medical Director for Cardiac Rehab at [facility].

## 2020-07-18 ENCOUNTER — Encounter (HOSPITAL_COMMUNITY)
Admission: RE | Admit: 2020-07-18 | Discharge: 2020-07-18 | Disposition: A | Discharge status: Home / Self Care | Payer: Medicare Other | Source: Ambulatory Visit | Attending: Pulmonary Disease | Admitting: Pulmonary Disease

## 2020-07-18 ENCOUNTER — Other Ambulatory Visit: Payer: Self-pay

## 2020-07-18 DIAGNOSIS — Z902 Acquired absence of lung [part of]: Secondary | ICD-10-CM

## 2020-07-18 DIAGNOSIS — R0600 Dyspnea, unspecified: Secondary | ICD-10-CM

## 2020-07-18 NOTE — Progress Notes (Signed)
Daily Session Note  Patient Details  Name: Daniel Colon MRN: [id number] Date of Birth: May 17, 1946 Referring Provider:     Pulmonary Rehab Walk Test from 06/05/2020 in [place]  Referring Provider Grace Isaac, MD      Encounter Date: 07/18/2020  Check In:  Session Check In - 07/18/20 1426      Check-In   Supervising physician immediately available to respond to emergencies [facility] immediately available    Physician(s) Dr. Wyline Copas    [facility]    Staff Present Maurice Small, RN, Bjorn Loser, MS, CEP, Exercise Physiologist;Oona Trammel Ysidro Evert, RN;David [place], MS, EP-C, CCRP    Virtual Visit No    Medication changes reported     No    Fall or balance concerns reported    No    Tobacco Cessation No Change    Warm-up and Cool-down Performed on first and last piece of equipment    Resistance Training Performed Yes    VAD Patient? No    PAD/SET Patient? No      Pain Assessment   Currently in Pain? No/denies    Pain Score 0-No pain    Multiple Pain Sites No           Capillary Blood Glucose: No results found for this or any previous visit (from the past 24 hour(s)).    Social History   Tobacco Use  Smoking Status Former Smoker  . Packs/day: 1.00  . Years: 42.00  . Pack years: 42.00  . Types: Cigarettes  . Quit date: 06/21/2011  . Years since quitting: 9.0  Smokeless Tobacco Current User  . Types: Chew    Goals Met:  Exercise tolerated well No report of cardiac concerns or symptoms Strength training completed today  Goals Unmet:  Not Applicable  Comments: Service time is from 1350 to 1445    Dr. Fransico Him is Medical Director for Cardiac Rehab at [facility].

## 2020-07-23 ENCOUNTER — Encounter (HOSPITAL_COMMUNITY)
Admission: RE | Admit: 2020-07-23 | Discharge: 2020-07-23 | Disposition: A | Discharge status: Home / Self Care | Payer: Medicare Other | Source: Ambulatory Visit | Attending: Pulmonary Disease | Admitting: Pulmonary Disease

## 2020-07-23 ENCOUNTER — Other Ambulatory Visit: Payer: Self-pay

## 2020-07-23 DIAGNOSIS — Z902 Acquired absence of lung [part of]: Secondary | ICD-10-CM | POA: Diagnosis not present

## 2020-07-23 DIAGNOSIS — R0600 Dyspnea, unspecified: Secondary | ICD-10-CM | POA: Insufficient documentation

## 2020-07-23 NOTE — Progress Notes (Signed)
Daily Session Note  Patient Details  Name: Daniel Colon MRN: [id number] Date of Birth: October 23, 1946 Referring Provider:     Pulmonary Rehab Walk Test from 06/05/2020 in [place]  Referring Provider Grace Isaac, MD      Encounter Date: 07/23/2020  Check In:  Session Check In - 07/23/20 1353      Check-In   Supervising physician immediately available to respond to emergencies [facility] immediately available    Physician(s) Dr. Levora Angel    [facility]    Staff Present Hoy Register, MS, CEP, Exercise Physiologist;Lisa Jani Gravel, MS, ACSM-CEP, Exercise Physiologist    Virtual Visit No    Medication changes reported     No    Fall or balance concerns reported    No    Tobacco Cessation No Change    Warm-up and Cool-down Performed on first and last piece of equipment    Resistance Training Performed Yes    VAD Patient? No    PAD/SET Patient? No      Pain Assessment   Currently in Pain? No/denies    Multiple Pain Sites No           Capillary Blood Glucose: No results found for this or any previous visit (from the past 24 hour(s)).    Social History   Tobacco Use  Smoking Status Former Smoker  . Packs/day: 1.00  . Years: 42.00  . Pack years: 42.00  . Types: Cigarettes  . Quit date: 06/21/2011  . Years since quitting: 9.0  Smokeless Tobacco Current User  . Types: Chew    Goals Met:  Proper associated with RPD/PD & O2 Sat Independence with exercise equipment Exercise tolerated well Strength training completed today  Goals Unmet:  Not Applicable  Comments: Service time is from 1345 to 1440    Dr. Fransico Him is Medical Director for Cardiac Rehab at [facility].

## 2020-07-25 ENCOUNTER — Other Ambulatory Visit: Payer: Self-pay

## 2020-07-25 ENCOUNTER — Encounter (HOSPITAL_COMMUNITY)
Admission: RE | Admit: 2020-07-25 | Discharge: 2020-07-25 | Disposition: A | Discharge status: Home / Self Care | Payer: Medicare Other | Source: Ambulatory Visit | Attending: Pulmonary Disease | Admitting: Pulmonary Disease

## 2020-07-25 DIAGNOSIS — R0600 Dyspnea, unspecified: Secondary | ICD-10-CM | POA: Diagnosis not present

## 2020-07-25 DIAGNOSIS — Z902 Acquired absence of lung [part of]: Secondary | ICD-10-CM | POA: Diagnosis not present

## 2020-07-25 NOTE — Progress Notes (Signed)
Daily Session Note  Patient Details  Name: Daniel Colon MRN: [id number] Date of Birth: 10-Nov-1946 Referring Provider:     Pulmonary Rehab Walk Test from 06/05/2020 in [place]  Referring Provider Grace Isaac, MD      Encounter Date: 07/25/2020  Check In:  Session Check In - 07/25/20 1353      Check-In   Supervising physician immediately available to respond to emergencies [facility] immediately available    Physician(s) Dr. Lupita Dawn    [facility]    Staff Present Hoy Register, MS, CEP, Exercise Physiologist;Lisa Jani Gravel, MS, ACSM-CEP, Exercise Physiologist    Virtual Visit No    Medication changes reported     No    Fall or balance concerns reported    No    Tobacco Cessation No Change    Warm-up and Cool-down Performed on first and last piece of equipment    Resistance Training Performed Yes    VAD Patient? No    PAD/SET Patient? No      Pain Assessment   Currently in Pain? No/denies    Multiple Pain Sites No           Capillary Blood Glucose: No results found for this or any previous visit (from the past 24 hour(s)).    Social History   Tobacco Use  Smoking Status Former Smoker  . Packs/day: 1.00  . Years: 42.00  . Pack years: 42.00  . Types: Cigarettes  . Quit date: 06/21/2011  . Years since quitting: 9.1  Smokeless Tobacco Current User  . Types: Chew    Goals Met:  Proper associated with RPD/PD & O2 Sat Independence with exercise equipment Exercise tolerated well No report of cardiac concerns or symptoms Strength training completed today  Goals Unmet:  Not Applicable  Comments: Service time is from 1345 to 1445    Dr. Fransico Him is Medical Director for Cardiac Rehab at [facility].

## 2020-07-29 ENCOUNTER — Other Ambulatory Visit: Payer: Self-pay

## 2020-07-29 ENCOUNTER — Ambulatory Visit (INDEPENDENT_AMBULATORY_CARE_PROVIDER_SITE_OTHER): Payer: Medicare Other | Admitting: Pulmonary Disease

## 2020-07-29 DIAGNOSIS — R0600 Dyspnea, unspecified: Secondary | ICD-10-CM

## 2020-07-29 DIAGNOSIS — R0609 Other forms of dyspnea: Secondary | ICD-10-CM

## 2020-07-29 LAB — PULMONARY FUNCTION TEST
DL/VA % pred: 80 %
DL/VA: 3.26 ml/min/mmHg/L
DLCO unc % pred: 60 %
DLCO unc: 15.22 ml/min/mmHg
FEF 25-75 Post: 1.28 L/sec
FEF 25-75 Pre: 1.16 L/sec
FEF2575-%Change-Post: 10 %
FEF2575-%Pred-Post: 55 %
FEF2575-%Pred-Pre: 49 %
FEV1-%Change-Post: 3 %
FEV1-%Pred-Post: 70 %
FEV1-%Pred-Pre: 67 %
FEV1-Post: 2.17 L
FEV1-Pre: 2.09 L
FEV1FVC-%Change-Post: 1 %
FEV1FVC-%Pred-Pre: 89 %
FEV6-%Change-Post: 1 %
FEV6-%Pred-Post: 80 %
FEV6-%Pred-Pre: 79 %
FEV6-Post: 3.19 L
FEV6-Pre: 3.15 L
FEV6FVC-%Change-Post: 0 %
FEV6FVC-%Pred-Post: 104 %
FEV6FVC-%Pred-Pre: 105 %
FVC-%Change-Post: 1 %
FVC-%Pred-Post: 76 %
FVC-%Pred-Pre: 75 %
FVC-Post: 3.23 L
FVC-Pre: 3.17 L
Post FEV1/FVC ratio: 67 %
Post FEV6/FVC ratio: 99 %
Pre FEV1/FVC ratio: 66 %
Pre FEV6/FVC Ratio: 99 %
RV % pred: 53 %
RV: 1.28 L
TLC % pred: 63 %
TLC: 4.36 L

## 2020-07-29 NOTE — Progress Notes (Signed)
Full PFT performed today. °

## 2020-07-30 ENCOUNTER — Encounter (HOSPITAL_COMMUNITY)
Admission: RE | Admit: 2020-07-30 | Discharge: 2020-07-30 | Disposition: A | Discharge status: Home / Self Care | Payer: Medicare Other | Source: Ambulatory Visit | Attending: Pulmonary Disease | Admitting: Pulmonary Disease

## 2020-07-30 VITALS — Wt 205.9 lb

## 2020-07-30 DIAGNOSIS — R0600 Dyspnea, unspecified: Secondary | ICD-10-CM | POA: Diagnosis not present

## 2020-07-30 DIAGNOSIS — Z902 Acquired absence of lung [part of]: Secondary | ICD-10-CM | POA: Diagnosis not present

## 2020-07-30 NOTE — Progress Notes (Signed)
Daily Session Note  Patient Details  Name: Daniel Colon MRN: [id number] Date of Birth: 22-Nov-1946 Referring Provider:     Pulmonary Rehab Walk Test from 06/05/2020 in [place]  Referring Provider Grace Isaac, MD      Encounter Date: 07/30/2020  Check In:  Session Check In - 07/30/20 1355      Check-In   Supervising physician immediately available to respond to emergencies [facility] immediately available    Physician(s) Dr. Lupita Dawn    [facility]    Staff Present Maurice Small, RN, Bjorn Loser, MS, CEP, Exercise Physiologist;Lisa Jani Gravel, MS, ACSM-CEP, Exercise Physiologist    Virtual Visit No    Medication changes reported     No    Fall or balance concerns reported    No    Tobacco Cessation No Change    Warm-up and Cool-down Performed on first and last piece of equipment    Resistance Training Performed Yes    VAD Patient? No    PAD/SET Patient? No      Pain Assessment   Currently in Pain? No/denies    Multiple Pain Sites No           Capillary Blood Glucose: No results found for this or any previous visit (from the past 24 hour(s)).   Exercise Prescription Changes - 07/30/20 1500      Response to Exercise   Blood Pressure (Admit) 118/60    Blood Pressure (Exercise) 150/50    Blood Pressure (Exit) 120/66    Heart Rate (Admit) 60 bpm    Heart Rate (Exercise) 102 bpm    Heart Rate (Exit) 66 bpm    Oxygen Saturation (Admit) 97 %    Oxygen Saturation (Exercise) 90 %    Oxygen Saturation (Exit) 96 %    Rating of Perceived Exertion (Exercise) 13    Perceived Dyspnea (Exercise) 2    Duration Continue with 30 min of aerobic exercise without signs/symptoms of physical distress.    Intensity THRR unchanged      Progression   Progression Continue to progress workloads to maintain intensity without signs/symptoms of physical distress.    Average METs 4.5       Resistance Training   Training Prescription Yes    Weight blue bands    Reps 10-15    Time 10 Minutes      Treadmill   MPH 3    Grade 2    Minutes 15      NuStep   Level 5    SPM 85    Minutes 15    METs 4.5           Social History   Tobacco Use  Smoking Status Former Smoker  . Packs/day: 1.00  . Years: 42.00  . Pack years: 42.00  . Types: Cigarettes  . Quit date: 06/21/2011  . Years since quitting: 9.1  Smokeless Tobacco Current User  . Types: Chew    Goals Met:  Proper associated with RPD/PD & O2 Sat Independence with exercise equipment Exercise tolerated well Strength training completed today  Goals Unmet:  Not Applicable  Comments: Service time is from 1350 to 1455    Dr. Fransico Him is Medical Director for Cardiac Rehab at [facility].

## 2020-07-31 NOTE — Progress Notes (Signed)
Pulmonary Individual Treatment Plan  Patient Details  Name: Daniel Colon MRN: [id number] Date of Birth: [birth year] Referring Provider:     Pulmonary Rehab Walk Test from 06/05/2020 in [place]  Referring Provider Grace Isaac, MD      Initial Encounter Date:    Pulmonary Rehab Walk Test from 06/05/2020 in [place]  Date 06/05/20      Visit Diagnosis: Dyspnea, unspecified type  Patient's Home Medications on Admission:   Current Outpatient Medications:  .  acetaminophen (TYLENOL) 500 MG tablet, Take 500-1,000 mg by mouth every 6 (six) hours as needed (for pain.)., Disp: , Rfl:  .  aspirin EC 81 MG tablet, Take 81 mg by mouth 3 (three) times a week. In the morning, Disp: , Rfl:  .  atorvastatin (LIPITOR) 40 MG tablet, TAKE 1 TABLET DAILY, Disp: 90 tablet, Rfl: 3 .  BREZTRI AEROSPHERE 160-9-4.8 MCG/ACT AERO, , Disp: , Rfl:  .  BUDESONIDE-FORMOTEROL FUMARATE IN, Inhale into the lungs., Disp: , Rfl:  .  carvedilol (COREG) 12.5 MG tablet, Take 12.5 mg by mouth daily. , Disp: , Rfl:  .  Coenzyme Q10 (CO Q-10) 100 MG CAPS, Take 100 mg by mouth daily. , Disp: , Rfl:  .  docusate sodium (COLACE) 100 MG capsule, Take 100 mg by mouth daily as needed for mild constipation., Disp: , Rfl:  .  ezetimibe (ZETIA) 10 MG tablet, Take 10 mg by mouth daily., Disp: , Rfl:  .  fish oil-omega-3 fatty acids 1000 MG capsule, Take 1,000 mg by mouth daily. , Disp: , Rfl:  .  guaiFENesin (MUCINEX) 600 MG 12 hr tablet, Take 1 tablet (600 mg total) by mouth 2 (two) times daily as needed for cough or to loosen phlegm., Disp: , Rfl:  .  ipratropium (ATROVENT) 0.03 % nasal spray, Place 1 spray into both nostrils daily. , Disp: , Rfl:  .  Multiple Vitamin (MULTIVITAMIN WITH MINERALS) TABS tablet, Take 1 tablet by mouth daily., Disp: , Rfl:  .  nitroGLYCERIN (NITROLINGUAL) 0.4 MG/SPRAY spray, USE 1 SPRAY ON OR UNDER THE TONGUE EVERY 5 MINUTES FOR  3 DOSES AS NEEDED FOR CHEST PAIN AS DIRECTED BY PRESCRIBER OR PACKAGE INSTRUCTIONS, Disp: 24 g, Rfl: 3 .  omeprazole (PRILOSEC) 10 MG capsule, Take 10 mg by mouth daily., Disp: , Rfl:  .  oxybutynin (DITROPAN) 5 MG tablet, Take 5 mg by mouth daily. , Disp: , Rfl:  .  prochlorperazine (COMPAZINE) 10 MG tablet, Take 1 tablet (10 mg total) by mouth every 6 (six) hours as needed for nausea or vomiting., Disp: 30 tablet, Rfl: 0 .  senna (SENOKOT) 176 MG/5ML SYRP, Take by mouth., Disp: , Rfl:  .  Sennosides (SENNA LAXATIVE) 25 MG TABS, Take 1 tablet by mouth daily., Disp: , Rfl:  .  telmisartan-hydrochlorothiazide (MICARDIS HCT) 80-12.5 MG tablet, Take 1 tablet by mouth daily., Disp: 90 tablet, Rfl: 3 .  traMADol (ULTRAM) 50 MG tablet, Take 1 tablet (50 mg total) by mouth every 6 (six) hours as needed (mild pain)., Disp: 20 tablet, Rfl: 0  Past Medical History: Past Medical History:  Diagnosis Date  . Allergy   . Arthritis   . Barrett's esophagus   . CAD (coronary artery disease)    a. Stent to the [place] (in [place]);  b. ETT(11/13/13): abnormal with early ST changes to suggest ischemia;   c. LHC (11/17/13):  pLAD 30-40, D1 30, mLAD 70-75, mCFX 70,  mRCA stent 40 (ISR), EF 55-65%.  Medical Rx  . Cataract    left eye removed, right immature  . Colon polyps   . Dyslipidemia   . GERD (gastroesophageal reflux disease)   . Hypertension   . Myocardial infarction ([place]) 1996  . Nephrolithiasis    kidney stone  . PUD (peptic ulcer disease)     Tobacco Use: Social History   Tobacco Use  Smoking Status Former Smoker  . Packs/day: 1.00  . Years: 42.00  . Pack years: 42.00  . Types: Cigarettes  . Quit date: 06/21/2011  . Years since quitting: 9.1  Smokeless Tobacco Current User  . Types: Chew    Labs: Recent Review Flowsheet Data    Labs for ITP Cardiac and Pulmonary Rehab Latest Ref Rng & Units 01/24/2020 01/24/2020 01/24/2020 01/25/2020 01/25/2020   PHART 7.35 - 7.45 7.255(L) 7.315(L) 7.371 7.417  7.439   PCO2ART 32 - 48 mmHg 62.6(H) 55.8(H) 41.5 38.5 35.2   HCO3 20.0 - 28.0 mmol/L 27.8 28.6(H) 24.1 24.7 24.0   TCO2 22 - 32 mmol/L '30 30 25 26 25   '$ ACIDBASEDEF 0.0 - 2.0 mmol/L - - 1.0 - -   O2SAT % 98.0 100.0 94.0 97.0 95.0      Capillary Blood Glucose: Lab Results  Component Value Date   GLUCAP 92 01/05/2020     Pulmonary Assessment Scores:  Pulmonary Assessment Scores    Row Name 06/05/20 1128         ADL UCSD   ADL Phase Entry     SOB Score total 26       CAT Score   CAT Score 18       mMRC Score   mMRC Score 3           UCSD: Self-administered rating of dyspnea associated with activities of daily living (ADLs) 6-point scale (0 = "not at all" to 5 = "maximal or unable to do because of breathlessness")  Scoring Scores range from 0 to 120.  Minimally important difference is 5 units  CAT: CAT can identify the health impairment of COPD patients and is better correlated with disease progression.  CAT has a scoring range of zero to 40. The CAT score is classified into four groups of low (less than 10), medium (10 - 20), high (21-30) and very high (31-40) based on the impact level of disease on health status. A CAT score over 10 suggests significant symptoms.  A worsening CAT score could be explained by an exacerbation, poor medication adherence, poor inhaler technique, or progression of COPD or comorbid conditions.  CAT MCID is 2 points  mMRC: mMRC (Modified Medical Research Council) Dyspnea Scale is used to assess the degree of baseline functional disability in patients of respiratory disease due to dyspnea. No minimal important difference is established. A decrease in score of 1 point or greater is considered a positive change.   Pulmonary Function Assessment:  Pulmonary Function Assessment - 06/05/20 1059      Breath   Bilateral Breath Sounds Clear    Shortness of Breath Limiting activity;Yes           Exercise Target Goals: Exercise Program  Goal: Individual exercise prescription set using results from initial 6 min walk test and THRR while considering  patient's activity barriers and safety.   Exercise Prescription Goal: Initial exercise prescription builds to 30-45 minutes a day of aerobic activity, 2-3 days per week.  Home exercise guidelines will be given to patient during  program as part of exercise prescription that the participant will acknowledge.  Activity Barriers & Risk Stratification:  Activity Barriers & Cardiac Risk Stratification - 06/05/20 1058      Activity Barriers & Cardiac Risk Stratification   Activity Barriers Deconditioning;Muscular Weakness;Shortness of Breath           6 Minute Walk:  6 Minute Walk    Row Name 06/05/20 1135         6 Minute Walk   Phase Initial     Distance 1293 feet     Walk Time 6 minutes     # of Rest Breaks 0     MPH 2.45     METS 2.73     RPE 9     Perceived Dyspnea  1     VO2 Peak 9.54     Symptoms Yes (comment)     Comments Mild SOB.     Resting HR 56 bpm     Resting BP 126/70     Resting Oxygen Saturation  97 %     Exercise Oxygen Saturation  during 6 min walk 93 %     Max Ex. HR 119 bpm     Max Ex. BP 118/56     2 Minute Post BP 112/60       Interval HR   1 Minute HR 108     2 Minute HR 116     3 Minute HR 119     4 Minute HR 106     5 Minute HR 114     6 Minute HR 95     2 Minute Post HR 66     Interval Heart Rate? Yes       Interval Oxygen   Interval Oxygen? Yes     Baseline Oxygen Saturation % 97 %     1 Minute Oxygen Saturation % 96 %     1 Minute Liters of Oxygen 0 L     2 Minute Oxygen Saturation % 95 %     2 Minute Liters of Oxygen 0 L     3 Minute Oxygen Saturation % 94 %     3 Minute Liters of Oxygen 0 L     4 Minute Oxygen Saturation % 94 %     4 Minute Liters of Oxygen 0 L     5 Minute Oxygen Saturation % 94 %     5 Minute Liters of Oxygen 0 L     6 Minute Oxygen Saturation % 93 %     6 Minute Liters of Oxygen 0 L     2 Minute  Post Oxygen Saturation % 98 %     2 Minute Post Liters of Oxygen 0 L            Oxygen Initial Assessment:  Oxygen Initial Assessment - 06/05/20 1059      Home Oxygen   Home Oxygen Device None    Sleep Oxygen Prescription None    Home Exercise Oxygen Prescription None    Home at Rest Exercise Oxygen Prescription None      Initial 6 min Walk   Oxygen Used None      Program Oxygen Prescription   Program Oxygen Prescription None      Intervention   Short Term Goals To learn and exhibit compliance with exercise, home and travel O2 prescription;To learn and understand importance of maintaining oxygen saturations>88%;To learn and demonstrate proper use of respiratory medications;To learn and  understand importance of monitoring SPO2 with pulse oximeter and demonstrate accurate use of the pulse oximeter.;To learn and demonstrate proper pursed lip breathing techniques or other breathing techniques.;Other    Long  Term Goals Exhibits compliance with exercise, home and travel O2 prescription;Verbalizes importance of monitoring SPO2 with pulse oximeter and return demonstration;Maintenance of O2 saturations>88%;Exhibits proper breathing techniques, such as pursed lip breathing or other method taught during program session;Compliance with respiratory medication;Other           Oxygen Re-Evaluation:  Oxygen Re-Evaluation    Row Name 07/02/20 0803 07/30/20 1506           Program Oxygen Prescription   Program Oxygen Prescription None None        Home Oxygen   Home Oxygen Device None None      Sleep Oxygen Prescription None None      Home Exercise Oxygen Prescription None None      Home at Rest Exercise Oxygen Prescription None None      Compliance with Home Oxygen Use Yes Yes        Goals/Expected Outcomes   Short Term Goals To learn and exhibit compliance with exercise, home and travel O2 prescription;To learn and understand importance of maintaining oxygen saturations>88%;To learn  and demonstrate proper use of respiratory medications;To learn and understand importance of monitoring SPO2 with pulse oximeter and demonstrate accurate use of the pulse oximeter.;To learn and demonstrate proper pursed lip breathing techniques or other breathing techniques.;Other To learn and exhibit compliance with exercise, home and travel O2 prescription;To learn and understand importance of monitoring SPO2 with pulse oximeter and demonstrate accurate use of the pulse oximeter.;To learn and understand importance of maintaining oxygen saturations>88%;To learn and demonstrate proper pursed lip breathing techniques or other breathing techniques.;To learn and demonstrate proper use of respiratory medications      Long  Term Goals Exhibits compliance with exercise, home and travel O2 prescription;Verbalizes importance of monitoring SPO2 with pulse oximeter and return demonstration;Maintenance of O2 saturations>88%;Exhibits proper breathing techniques, such as pursed lip breathing or other method taught during program session;Compliance with respiratory medication;Other Exhibits compliance with exercise, home and travel O2 prescription;Verbalizes importance of monitoring SPO2 with pulse oximeter and return demonstration;Maintenance of O2 saturations>88%;Exhibits proper breathing techniques, such as pursed lip breathing or other method taught during program session;Compliance with respiratory medication;Demonstrates proper use of MDI's      Goals/Expected Outcomes compliance compliance             Oxygen Discharge (Final Oxygen Re-Evaluation):  Oxygen Re-Evaluation - 07/30/20 1506      Program Oxygen Prescription   Program Oxygen Prescription None      Home Oxygen   Home Oxygen Device None    Sleep Oxygen Prescription None    Home Exercise Oxygen Prescription None    Home at Rest Exercise Oxygen Prescription None    Compliance with Home Oxygen Use Yes      Goals/Expected Outcomes   Short Term  Goals To learn and exhibit compliance with exercise, home and travel O2 prescription;To learn and understand importance of monitoring SPO2 with pulse oximeter and demonstrate accurate use of the pulse oximeter.;To learn and understand importance of maintaining oxygen saturations>88%;To learn and demonstrate proper pursed lip breathing techniques or other breathing techniques.;To learn and demonstrate proper use of respiratory medications    Long  Term Goals Exhibits compliance with exercise, home and travel O2 prescription;Verbalizes importance of monitoring SPO2 with pulse oximeter and return demonstration;Maintenance of O2 saturations>88%;Exhibits proper breathing  techniques, such as pursed lip breathing or other method taught during program session;Compliance with respiratory medication;Demonstrates proper use of MDI's    Goals/Expected Outcomes compliance           Initial Exercise Prescription:  Initial Exercise Prescription - 06/05/20 1100      Date of Initial Exercise RX and Referring Provider   Date 06/05/20    Referring Provider Grace Isaac, MD      Treadmill   MPH 2.2    Grade 0    Minutes 15    METs 2.68      NuStep   Level 3    SPM 85    Minutes 15    METs 2.6      Prescription Details   Frequency (times per week) 2    Duration Progress to 30 minutes of continuous aerobic without signs/symptoms of physical distress      Intensity   THRR 40-80% of Max Heartrate 60-119    Ratings of Perceived Exertion 11-13    Perceived Dyspnea 0-4      Progression   Progression Continue to progress workloads to maintain intensity without signs/symptoms of physical distress.      Resistance Training   Training Prescription Yes    Weight --   blue bands   Reps 10-15           Perform Capillary Blood Glucose checks as needed.  Exercise Prescription Changes:  Exercise Prescription Changes    Row Name 06/18/20 1500 06/27/20 1400 07/02/20 1400 07/16/20 1400 07/30/20  1500     Response to Exercise   Blood Pressure (Admit) 130/86 -- 124/70 134/62 118/60   Blood Pressure (Exercise) 114/56 -- 146/58 150/64 150/50   Blood Pressure (Exit) 146/74 -- 110/56 102/60 120/66   Heart Rate (Admit) 66 bpm -- 64 bpm 62 bpm 60 bpm   Heart Rate (Exercise) 98 bpm -- 89 bpm 93 bpm 102 bpm   Heart Rate (Exit) 71 bpm -- 70 bpm 73 bpm 66 bpm   Oxygen Saturation (Admit) 96 % -- 97 % 98 % 97 %   Oxygen Saturation (Exercise) 91 % -- 91 % 89 % 90 %   Oxygen Saturation (Exit) 98 % -- 98 % 99 % 96 %   Rating of Perceived Exertion (Exercise) 11 -- 12 12.5 13   Perceived Dyspnea (Exercise) 1 -- 1.5 1.5 2   Duration Continue with 30 min of aerobic exercise without signs/symptoms of physical distress. -- Continue with 30 min of aerobic exercise without signs/symptoms of physical distress. Continue with 30 min of aerobic exercise without signs/symptoms of physical distress. Continue with 30 min of aerobic exercise without signs/symptoms of physical distress.   Intensity --  40-80% HRR -- THRR unchanged THRR unchanged THRR unchanged     Progression   Progression Continue to progress workloads to maintain intensity without signs/symptoms of physical distress. -- Continue to progress workloads to maintain intensity without signs/symptoms of physical distress. Continue to progress workloads to maintain intensity without signs/symptoms of physical distress. Continue to progress workloads to maintain intensity without signs/symptoms of physical distress.   Average METs -- -- -- -- 4.5     Resistance Training   Training Prescription Yes -- Yes Yes Yes   Weight Blue Bands -- blue bands blue bands blue bands   Reps 10-15 -- 10-15 10-15 10-15   Time 10 Minutes -- 10 Minutes 10 Minutes 10 Minutes     Treadmill   MPH 2.2 -- 2.5  2.7 3   Grade 0 -- '1 1 2   '$ Minutes 15 -- '15 15 15     '$ NuStep   Level 3 -- '4 5 5   '$ SPM 85 -- 85 85 85   Minutes 15 -- '15 15 15   '$ METs 2.9 -- 3.7 4.3 4.5     Home  Exercise Plan   Plans to continue exercise at -- Home (comment) -- -- --   Frequency -- Add 4 additional days to program exercise sessions. -- -- --   Initial Home Exercises Provided -- 06/27/20 -- -- --          Exercise Comments:  Exercise Comments    Row Name 06/27/20 1429 07/31/20 1517         Exercise Comments home exercise reviewed Pt engages in golfing 3 x week on Mondays Wednesdays and Saturday.  He averages about 3 1/2 miles over 1:40 minutes.  Pt is doing well with good progess with METS and increased workloads             Exercise Goals and Review:  Exercise Goals    Row Name 06/05/20 1153 07/02/20 0804 07/30/20 1510         Exercise Goals   Increase Physical Activity Yes Yes Yes     Intervention Provide advice, education, support and counseling about physical activity/exercise needs.;Develop an individualized exercise prescription for aerobic and resistive training based on initial evaluation findings, risk stratification, comorbidities and participant's personal goals. Provide advice, education, support and counseling about physical activity/exercise needs.;Develop an individualized exercise prescription for aerobic and resistive training based on initial evaluation findings, risk stratification, comorbidities and participant's personal goals. Provide advice, education, support and counseling about physical activity/exercise needs.;Develop an individualized exercise prescription for aerobic and resistive training based on initial evaluation findings, risk stratification, comorbidities and participant's personal goals.     Expected Outcomes Short Term: Attend rehab on a regular basis to increase amount of physical activity.;Long Term: Exercising regularly at least 3-5 days a week.;Long Term: Add in home exercise to make exercise part of routine and to increase amount of physical activity. Short Term: Attend rehab on a regular basis to increase amount of physical  activity.;Long Term: Exercising regularly at least 3-5 days a week.;Long Term: Add in home exercise to make exercise part of routine and to increase amount of physical activity. Short Term: Attend rehab on a regular basis to increase amount of physical activity.;Long Term: Add in home exercise to make exercise part of routine and to increase amount of physical activity.;Long Term: Exercising regularly at least 3-5 days a week.     Increase Strength and Stamina Yes Yes Yes     Intervention Provide advice, education, support and counseling about physical activity/exercise needs.;Develop an individualized exercise prescription for aerobic and resistive training based on initial evaluation findings, risk stratification, comorbidities and participant's personal goals. Provide advice, education, support and counseling about physical activity/exercise needs.;Develop an individualized exercise prescription for aerobic and resistive training based on initial evaluation findings, risk stratification, comorbidities and participant's personal goals. Provide advice, education, support and counseling about physical activity/exercise needs.;Develop an individualized exercise prescription for aerobic and resistive training based on initial evaluation findings, risk stratification, comorbidities and participant's personal goals.     Expected Outcomes Short Term: Increase workloads from initial exercise prescription for resistance, speed, and METs.;Short Term: Perform resistance training exercises routinely during rehab and add in resistance training at home;Long Term: Improve cardiorespiratory fitness, muscular endurance and strength  as measured by increased METs and functional capacity (6MWT) Short Term: Increase workloads from initial exercise prescription for resistance, speed, and METs.;Short Term: Perform resistance training exercises routinely during rehab and add in resistance training at home;Long Term: Improve  cardiorespiratory fitness, muscular endurance and strength as measured by increased METs and functional capacity (6MWT) Short Term: Increase workloads from initial exercise prescription for resistance, speed, and METs.;Short Term: Perform resistance training exercises routinely during rehab and add in resistance training at home;Long Term: Improve cardiorespiratory fitness, muscular endurance and strength as measured by increased METs and functional capacity (6MWT)     Able to understand and use rate of perceived exertion (RPE) scale Yes Yes Yes     Intervention Provide education and explanation on how to use RPE scale Provide education and explanation on how to use RPE scale Provide education and explanation on how to use RPE scale     Expected Outcomes Short Term: Able to use RPE daily in rehab to express subjective intensity level;Long Term:  Able to use RPE to guide intensity level when exercising independently Short Term: Able to use RPE daily in rehab to express subjective intensity level;Long Term:  Able to use RPE to guide intensity level when exercising independently Short Term: Able to use RPE daily in rehab to express subjective intensity level;Long Term:  Able to use RPE to guide intensity level when exercising independently     Able to understand and use Dyspnea scale Yes Yes Yes     Intervention Provide education and explanation on how to use Dyspnea scale Provide education and explanation on how to use Dyspnea scale Provide education and explanation on how to use Dyspnea scale     Expected Outcomes Short Term: Able to use Dyspnea scale daily in rehab to express subjective sense of shortness of breath during exertion;Long Term: Able to use Dyspnea scale to guide intensity level when exercising independently Short Term: Able to use Dyspnea scale daily in rehab to express subjective sense of shortness of breath during exertion;Long Term: Able to use Dyspnea scale to guide intensity level when  exercising independently Short Term: Able to use Dyspnea scale daily in rehab to express subjective sense of shortness of breath during exertion;Long Term: Able to use Dyspnea scale to guide intensity level when exercising independently     Knowledge and understanding of Target Heart Rate Range (THRR) Yes Yes Yes     Intervention Provide education and explanation of THRR including how the numbers were predicted and where they are located for reference Provide education and explanation of THRR including how the numbers were predicted and where they are located for reference Provide education and explanation of THRR including how the numbers were predicted and where they are located for reference     Expected Outcomes Short Term: Able to state/look up THRR;Long Term: Able to use THRR to govern intensity when exercising independently;Short Term: Able to use daily as guideline for intensity in rehab Short Term: Able to state/look up THRR;Long Term: Able to use THRR to govern intensity when exercising independently;Short Term: Able to use daily as guideline for intensity in rehab Short Term: Able to state/look up THRR;Long Term: Able to use THRR to govern intensity when exercising independently;Short Term: Able to use daily as guideline for intensity in rehab     Understanding of Exercise Prescription Yes Yes Yes     Intervention Provide education, explanation, and written materials on patient's individual exercise prescription Provide education, explanation, and written materials on  patient's individual exercise prescription Provide education, explanation, and written materials on patient's individual exercise prescription     Expected Outcomes Short Term: Able to explain program exercise prescription;Long Term: Able to explain home exercise prescription to exercise independently Short Term: Able to explain program exercise prescription;Long Term: Able to explain home exercise prescription to exercise  independently Short Term: Able to explain program exercise prescription;Long Term: Able to explain home exercise prescription to exercise independently            Exercise Goals Re-Evaluation :  Exercise Goals Re-Evaluation    Row Name 07/02/20 0804 07/30/20 1507           Exercise Goal Re-Evaluation   Exercise Goals Review Increase Physical Activity;Increase Strength and Stamina;Able to understand and use rate of perceived exertion (RPE) scale;Able to understand and use Dyspnea scale;Knowledge and understanding of Target Heart Rate Range (THRR);Understanding of Exercise Prescription Increase Physical Activity;Increase Strength and Stamina;Able to understand and use rate of perceived exertion (RPE) scale;Able to understand and use Dyspnea scale;Knowledge and understanding of Target Heart Rate Range (THRR);Understanding of Exercise Prescription      Comments Pt has completed 6 exercise sessions. He is extremely active and exercises everyday. He is progressing well and is a pleasure to be around. He currently exercises at 3.5 METs on the stepper. Will continue to monitor and progress as able. pt has completed 15 exercise sessions with pulmonary rehab. He is very independent with his exercise prescription and continues to progress intensity each session. He also is very active outside of pulmonary rehab and plays golf 3 times per week. His METs on the NuStep are 4.5. We will continue to monitor and progress as the pt is able.      Expected Outcomes Through exercise at rehab and at home, the patient will decrease shortness of breath with daily activities and feel confident in carrying out an exercise regime at home. Through exercise at pulmonary rehab and home the patient will decrease shortness of breath with daily activities and feel confident in carrying out an exercise prescription at home             Discharge Exercise Prescription (Final Exercise Prescription Changes):  Exercise Prescription  Changes - 07/30/20 1500      Response to Exercise   Blood Pressure (Admit) 118/60    Blood Pressure (Exercise) 150/50    Blood Pressure (Exit) 120/66    Heart Rate (Admit) 60 bpm    Heart Rate (Exercise) 102 bpm    Heart Rate (Exit) 66 bpm    Oxygen Saturation (Admit) 97 %    Oxygen Saturation (Exercise) 90 %    Oxygen Saturation (Exit) 96 %    Rating of Perceived Exertion (Exercise) 13    Perceived Dyspnea (Exercise) 2    Duration Continue with 30 min of aerobic exercise without signs/symptoms of physical distress.    Intensity THRR unchanged      Progression   Progression Continue to progress workloads to maintain intensity without signs/symptoms of physical distress.    Average METs 4.5      Resistance Training   Training Prescription Yes    Weight blue bands    Reps 10-15    Time 10 Minutes      Treadmill   MPH 3    Grade 2    Minutes 15      NuStep   Level 5    SPM 85    Minutes 15    METs 4.5  Nutrition:  Target Goals: Understanding of nutrition guidelines, daily intake of sodium '1500mg'$ , cholesterol '200mg'$ , calories 30% from fat and 7% or less from saturated fats, daily to have 5 or more servings of fruits and vegetables.  Biometrics:  Pre Biometrics - 06/05/20 1211      Pre Biometrics   Grip Strength 33 kg            Nutrition Therapy Plan and Nutrition Goals:  Nutrition Therapy & Goals - 06/13/20 1436      Nutrition Therapy   Diet Heart Healthy      Personal Nutrition Goals   Nutrition Goal Pt to identify food quantities necessary to achieve weight loss of 6-24 lb at graduation from pulmonary rehab.      Intervention Plan   Intervention Prescribe, educate and counsel regarding individualized specific dietary modifications aiming towards targeted core components such as weight, hypertension, lipid management, diabetes, heart failure and other comorbidities.    Expected Outcomes Short Term Goal: Understand basic principles of dietary  content, such as calories, fat, sodium, cholesterol and nutrients.           Nutrition Assessments:   Nutrition Goals Re-Evaluation:  Nutrition Goals Re-Evaluation    Daniel Colon Name 06/13/20 1436 07/03/20 1020           Goals   Current Weight 205 lb (93 kg) 205 lb 11 oz (93.3 kg)      Nutrition Goal -- Pt to identify food quantities necessary to achieve weight loss of 6-24 lb at graduation from pulmonary rehab.      Comment -- Pt has been working on this goal prior to rehab and prefers to continue without support of RD.             Nutrition Goals Discharge (Final Nutrition Goals Re-Evaluation):  Nutrition Goals Re-Evaluation - 07/03/20 1020      Goals   Current Weight 205 lb 11 oz (93.3 kg)    Nutrition Goal Pt to identify food quantities necessary to achieve weight loss of 6-24 lb at graduation from pulmonary rehab.    Comment Pt has been working on this goal prior to rehab and prefers to continue without support of RD.           Psychosocial: Target Goals: Acknowledge presence or absence of significant depression and/or stress, maximize coping skills, provide positive support system. Participant is able to verbalize types and ability to use techniques and skills needed for reducing stress and depression.  Initial Review & Psychosocial Screening:  Initial Psych Review & Screening - 06/05/20 1105      Initial Review   Current issues with None Identified      Family Dynamics   Good Support System? Yes      Barriers   Psychosocial barriers to participate in program There are no identifiable barriers or psychosocial needs.      Screening Interventions   Interventions Encouraged to exercise           Quality of Life Scores:  Scores of 19 and below usually indicate a poorer quality of life in these areas.  A difference of  2-3 points is a clinically meaningful difference.  A difference of 2-3 points in the total score of the Quality of Life Index has been associated  with significant improvement in overall quality of life, self-image, physical symptoms, and general health in studies assessing change in quality of life.  PHQ-9: Recent Review Flowsheet Data    Depression screen [facility] 2/9 06/05/2020  Decreased Interest 0   Down, Depressed, Hopeless 0   PHQ - 2 Score 0   Altered sleeping 0   Tired, decreased energy 0   Change in appetite 0   Feeling bad or failure about yourself  0   Trouble concentrating 0   Moving slowly or fidgety/restless 0   Suicidal thoughts 0   Difficult doing work/chores Not difficult at all     Interpretation of Total Score  Total Score Depression Severity:  1-4 = Minimal depression, 5-9 = Mild depression, 10-14 = Moderate depression, 15-19 = Moderately severe depression, 20-27 = Severe depression   Psychosocial Evaluation and Intervention:  Psychosocial Evaluation - 06/05/20 1105      Psychosocial Evaluation & Interventions   Interventions Encouraged to exercise with the program and follow exercise prescription    Comments No concerns identified at this time    Expected Outcomes For Daniel Colon to have no barriers or psychosocial concerns while participating in pulmonary rehab    Continue Psychosocial Services  No Follow up required           Psychosocial Re-Evaluation:  Psychosocial Re-Evaluation    Daniel Colon Name 07/02/20 0923 07/31/20 1518           Psychosocial Re-Evaluation   Current issues with None Identified None Identified      Comments No psychosocial concerns have been identified at this time. No psychosocial concerns have been identified at this time.      Expected Outcomes For Daniel Colon to continue to be free of psychosocial concerns while participating in pulmonary rehab. For Daniel Colon to continue to be free of psychosocial concerns while participating in pulmonary rehab with continued positve and healthy outlook      Interventions Encouraged to attend Pulmonary Rehabilitation for the exercise Encouraged to attend  Pulmonary Rehabilitation for the exercise      Continue Psychosocial Services  No Follow up required Follow up required by staff  Will periodically check in with pt regarding mental well being.             Psychosocial Discharge (Final Psychosocial Re-Evaluation):  Psychosocial Re-Evaluation - 07/31/20 1518      Psychosocial Re-Evaluation   Current issues with None Identified    Comments No psychosocial concerns have been identified at this time.    Expected Outcomes For Daniel Colon to continue to be free of psychosocial concerns while participating in pulmonary rehab with continued positve and healthy outlook    Interventions Encouraged to attend Pulmonary Rehabilitation for the exercise    Continue Psychosocial Services  Follow up required by staff   Will periodically check in with pt regarding mental well being.          Education: Education Goals: Education classes will be provided on a weekly basis, covering required topics. Participant will state understanding/return demonstration of topics presented.  Learning Barriers/Preferences:  Learning Barriers/Preferences - 06/05/20 1107      Learning Barriers/Preferences   Learning Barriers None    Learning Preferences Audio;Computer/Internet;Group Instruction;Individual Instruction;Pictoral;Skilled Demonstration;Verbal Instruction;Video;Written Material           Education Topics: Risk Factor Reduction:  -Group instruction that is supported by a PowerPoint presentation. Instructor discusses the definition of a risk factor, different risk factors for pulmonary disease, and how the heart and lungs work together.     PULMONARY REHAB OTHER RESPIRATORY from 07/16/2020 in [place]  Date 07/16/20  Educator Breeathing Meditation handout      Nutrition for Pulmonary Patient:  -  Group instruction provided by PowerPoint slides, verbal discussion, and written materials to support subject matter. The  instructor gives an explanation and review of healthy diet recommendations, which includes a discussion on weight management, recommendations for fruit and vegetable consumption, as well as protein, fluid, caffeine, fiber, sodium, sugar, and alcohol. Tips for eating when patients are short of breath are discussed.   Pursed Lip Breathing:  -Group instruction that is supported by demonstration and informational handouts. Instructor discusses the benefits of pursed lip and diaphragmatic breathing and detailed demonstration on how to preform both.     PULMONARY REHAB OTHER RESPIRATORY from 06/11/2020 in [place]  Date 06/11/20  Educator Handout      Oxygen Safety:  -Group instruction provided by PowerPoint, verbal discussion, and written material to support subject matter. There is an overview of "What is Oxygen" and "Why do we need it".  Instructor also reviews how to create a safe environment for oxygen use, the importance of using oxygen as prescribed, and the risks of noncompliance. There is a brief discussion on traveling with oxygen and resources the patient may utilize.   Oxygen Equipment:  -Group instruction provided by [facility] Staff utilizing handouts, written materials, and equipment demonstrations.   Signs and Symptoms:  -Group instruction provided by written material and verbal discussion to support subject matter. Warning signs and symptoms of infection, stroke, and heart attack are reviewed and when to call the physician/911 reinforced. Tips for preventing the spread of infection discussed.   Advanced Directives:  -Group instruction provided by verbal instruction and written material to support subject matter. Instructor reviews Advanced Directive laws and proper instruction for filling out document.   Pulmonary Video:  -Group video education that reviews the importance of medication and oxygen compliance, exercise, good nutrition, pulmonary  hygiene, and pursed lip and diaphragmatic breathing for the pulmonary patient.   Exercise for the Pulmonary Patient:  -Group instruction that is supported by a PowerPoint presentation. Instructor discusses benefits of exercise, core components of exercise, frequency, duration, and intensity of an exercise routine, importance of utilizing pulse oximetry during exercise, safety while exercising, and options of places to exercise outside of rehab.     Pulmonary Medications:  -Verbally interactive group education provided by instructor with focus on inhaled medications and proper administration.   Anatomy and Physiology of the Respiratory System and Intimacy:  -Group instruction provided by PowerPoint, verbal discussion, and written material to support subject matter. Instructor reviews respiratory cycle and anatomical components of the respiratory system and their functions. Instructor also reviews differences in obstructive and restrictive respiratory diseases with examples of each. Intimacy, Sex, and Sexuality differences are reviewed with a discussion on how relationships can change when diagnosed with pulmonary disease. Common sexual concerns are reviewed.   MD DAY -A group question and answer session with a medical doctor that allows participants to ask questions that relate to their pulmonary disease state.   OTHER EDUCATION -Group or individual verbal, written, or video instructions that support the educational goals of the pulmonary rehab program.   PULMONARY REHAB OTHER RESPIRATORY from 07/25/2020 in [place]  Date 07/25/20  Educator Handout      Holiday Eating Survival Tips:  -Group instruction provided by PowerPoint slides, verbal discussion, and written materials to support subject matter. The instructor gives patients tips, tricks, and techniques to help them not only survive but enjoy the holidays despite the onslaught of food that accompanies  the holidays.  Knowledge Questionnaire Score:  Knowledge Questionnaire Score - 06/05/20 1128      Knowledge Questionnaire Score   Pre Score 16/18           Core Components/Risk Factors/Patient Goals at Admission:  Personal Goals and Risk Factors at Admission - 06/05/20 1107      Core Components/Risk Factors/Patient Goals on Admission   Improve shortness of breath with ADL's Yes    Intervention Provide education, individualized exercise plan and daily activity instruction to help decrease symptoms of SOB with activities of daily living.    Expected Outcomes Short Term: Improve cardiorespiratory fitness to achieve a reduction of symptoms when performing ADLs;Long Term: Be able to perform more ADLs without symptoms or delay the onset of symptoms           Core Components/Risk Factors/Patient Goals Review:   Goals and Risk Factor Review    Row Name 06/05/20 1107 07/02/20 0925 07/31/20 1519         Core Components/Risk Factors/Patient Goals Review   Personal Goals Review Increase knowledge of respiratory medications and ability to use respiratory devices properly.;Improve shortness of breath with ADL's;Develop more efficient breathing techniques such as purse lipped breathing and diaphragmatic breathing and practicing self-pacing with activity. Increase knowledge of respiratory medications and ability to use respiratory devices properly.;Improve shortness of breath with ADL's;Develop more efficient breathing techniques such as purse lipped breathing and diaphragmatic breathing and practicing self-pacing with activity. Increase knowledge of respiratory medications and ability to use respiratory devices properly.;Improve shortness of breath with ADL's;Develop more efficient breathing techniques such as purse lipped breathing and diaphragmatic breathing and practicing self-pacing with activity.     Review -- Has progressed very well, he plays 18 holes of golf before coming to exercise and  is on level 4 of the nustep @ 3.5 mets, and walking on the treadmill @ 2.5 mph and no incline. Michah is doing very well both here at Pulmonary rehab and with consistent home exercise. Pt plans to continue to work on efficent breathing techniques such as PLB.  Will continue to provide verbal cues and reminders.     Expected Outcomes -- See admission goals. See admission goals.            Core Components/Risk Factors/Patient Goals at Discharge (Final Review):   Goals and Risk Factor Review - 07/31/20 1519      Core Components/Risk Factors/Patient Goals Review   Personal Goals Review Increase knowledge of respiratory medications and ability to use respiratory devices properly.;Improve shortness of breath with ADL's;Develop more efficient breathing techniques such as purse lipped breathing and diaphragmatic breathing and practicing self-pacing with activity.    Review Daniel Colon is doing very well both here at Pulmonary rehab and with consistent home exercise. Pt plans to continue to work on efficent breathing techniques such as PLB.  Will continue to provide verbal cues and reminders.    Expected Outcomes See admission goals.           ITP Comments:  ITP Comments    Row Name 07/31/20 1515           ITP Comments Dr. Mechele Collin, Medical Director [facility]              Comments:  Daniel Colon has completed 15 exercise session in Pulmonary rehab. Pt maintains good attendance and consistent home exercise. Pulmonary rehab staff will  continue to monitor and reassess progress toward goals during her participation in Pulmonary Rehab. Alanson Aly, BSN  Cardiac and Pulmonary Rehab Nurse Navigator

## 2020-08-01 ENCOUNTER — Encounter (HOSPITAL_COMMUNITY)
Admission: RE | Admit: 2020-08-01 | Discharge: 2020-08-01 | Disposition: A | Discharge status: Home / Self Care | Payer: Medicare Other | Source: Ambulatory Visit | Attending: Pulmonary Disease | Admitting: Pulmonary Disease

## 2020-08-01 ENCOUNTER — Other Ambulatory Visit: Payer: Self-pay

## 2020-08-01 DIAGNOSIS — R0600 Dyspnea, unspecified: Secondary | ICD-10-CM | POA: Diagnosis not present

## 2020-08-01 DIAGNOSIS — Z902 Acquired absence of lung [part of]: Secondary | ICD-10-CM | POA: Diagnosis not present

## 2020-08-01 NOTE — Progress Notes (Signed)
Daily Session Note  Patient Details  Name: Melvern Lee Knaggs MRN: [id number] Date of Birth: 10/02/1946 Referring Provider:     Pulmonary Rehab Walk Test from 06/05/2020 in [facility]  Referring Provider Gerhardt, Edward B, MD      Encounter Date: 08/01/2020  Check In:  Session Check In - 08/01/20 1352      Check-In   Supervising physician immediately available to respond to emergencies [facility] immediately available    Physician(s) Dr. O Sheikh    Location [facility]    Staff Present Carlette Carlton, RN, BSN;Dalton Fletcher, MS, CEP, Exercise Physiologist;Lisa Hughes, RN; , MS, ACSM-CEP, Exercise Physiologist    Virtual Visit No    Medication changes reported     No    Fall or balance concerns reported    No    Tobacco Cessation No Change    Warm-up and Cool-down Performed on first and last piece of equipment    Resistance Training Performed Yes    VAD Patient? No    PAD/SET Patient? No      Pain Assessment   Currently in Pain? No/denies    Multiple Pain Sites No           Capillary Blood Glucose: No results found for this or any previous visit (from the past 24 hour(s)).    Social History   Tobacco Use  Smoking Status Former Smoker  . Packs/day: 1.00  . Years: 42.00  . Pack years: 42.00  . Types: Cigarettes  . Quit date: 06/21/2011  . Years since quitting: 9.1  Smokeless Tobacco Current User  . Types: Chew    Goals Met:  Proper associated with RPD/PD & O2 Sat Independence with exercise equipment Exercise tolerated well No report of cardiac concerns or symptoms Strength training completed today  Goals Unmet:  Not Applicable  Comments: Service time is from 1345 to 1450    Dr. Traci Turner is Medical Director for Cardiac Rehab at [facility]. 

## 2020-08-06 ENCOUNTER — Other Ambulatory Visit: Payer: Self-pay

## 2020-08-06 ENCOUNTER — Encounter (HOSPITAL_COMMUNITY)
Admission: RE | Admit: 2020-08-06 | Discharge: 2020-08-06 | Disposition: A | Discharge status: Home / Self Care | Payer: Medicare Other | Source: Ambulatory Visit | Attending: Pulmonary Disease | Admitting: Pulmonary Disease

## 2020-08-06 DIAGNOSIS — R0600 Dyspnea, unspecified: Secondary | ICD-10-CM

## 2020-08-06 DIAGNOSIS — Z902 Acquired absence of lung [part of]: Secondary | ICD-10-CM | POA: Diagnosis not present

## 2020-08-06 NOTE — Progress Notes (Signed)
Daily Session Note  Patient Details  Name: Daniel Colon MRN: [id number] Date of Birth: 25-Jan-1946 Referring Provider:     Pulmonary Rehab Walk Test from 06/05/2020 in [place]  Referring Provider Grace Isaac, MD      Encounter Date: 08/06/2020  Check In:  Session Check In - 08/06/20 1334      Check-In   Supervising physician immediately available to respond to emergencies [facility] immediately available    Physician(s) Dr. Claybon Jabs    Location [facility]    Staff Present Maurice Small, RN, Bjorn Loser, MS, CEP, Exercise Physiologist;Lisa Jani Gravel, MS, ACSM-CEP, Exercise Physiologist    Virtual Visit No    Medication changes reported     No    Fall or balance concerns reported    No    Tobacco Cessation No Change    Warm-up and Cool-down Performed on first and last piece of equipment    Resistance Training Performed Yes    VAD Patient? No    PAD/SET Patient? No      Pain Assessment   Currently in Pain? No/denies    Multiple Pain Sites No           Capillary Blood Glucose: No results found for this or any previous visit (from the past 24 hour(s)).    Social History   Tobacco Use  Smoking Status Former Smoker   Packs/day: 1.00   Years: 42.00   Pack years: 42.00   Types: Cigarettes   Quit date: 06/21/2011   Years since quitting: 9.1  Smokeless Tobacco Current User   Types: Chew    Goals Met:  Proper associated with RPD/PD & O2 Sat Independence with exercise equipment Exercise tolerated well No report of cardiac concerns or symptoms Strength training completed today  Goals Unmet:  Not Applicable  Comments: Service time is from 1335 to 76    Dr. Fransico Him is Medical Director for Cardiac Rehab at [facility].

## 2020-08-08 ENCOUNTER — Encounter (HOSPITAL_COMMUNITY)
Admission: RE | Admit: 2020-08-08 | Discharge: 2020-08-08 | Disposition: A | Discharge status: Home / Self Care | Payer: Medicare Other | Source: Ambulatory Visit | Attending: Pulmonary Disease | Admitting: Pulmonary Disease

## 2020-08-08 ENCOUNTER — Other Ambulatory Visit: Payer: Self-pay

## 2020-08-08 DIAGNOSIS — R0600 Dyspnea, unspecified: Secondary | ICD-10-CM | POA: Diagnosis not present

## 2020-08-08 DIAGNOSIS — Z902 Acquired absence of lung [part of]: Secondary | ICD-10-CM | POA: Diagnosis not present

## 2020-08-12 DIAGNOSIS — Z9861 Coronary angioplasty status: Secondary | ICD-10-CM | POA: Diagnosis not present

## 2020-08-12 DIAGNOSIS — E669 Obesity, unspecified: Secondary | ICD-10-CM | POA: Diagnosis not present

## 2020-08-12 DIAGNOSIS — I10 Essential (primary) hypertension: Secondary | ICD-10-CM | POA: Diagnosis not present

## 2020-08-12 DIAGNOSIS — K2270 Barrett's esophagus without dysplasia: Secondary | ICD-10-CM | POA: Diagnosis not present

## 2020-08-12 DIAGNOSIS — E785 Hyperlipidemia, unspecified: Secondary | ICD-10-CM | POA: Diagnosis not present

## 2020-08-12 DIAGNOSIS — J439 Emphysema, unspecified: Secondary | ICD-10-CM | POA: Diagnosis not present

## 2020-08-15 ENCOUNTER — Ambulatory Visit: Payer: Medicare Other | Admitting: Cardiothoracic Surgery

## 2020-08-27 NOTE — Addendum Note (Signed)
Encounter addended by: George Ina, RD on: 08/27/2020 8:10 AM  Actions taken: Flowsheet accepted

## 2020-08-28 ENCOUNTER — Inpatient Hospital Stay: Payer: Medicare Other | Attending: Internal Medicine

## 2020-08-28 ENCOUNTER — Other Ambulatory Visit: Payer: Self-pay

## 2020-08-28 ENCOUNTER — Encounter (HOSPITAL_COMMUNITY): Payer: Self-pay

## 2020-08-28 ENCOUNTER — Ambulatory Visit (HOSPITAL_COMMUNITY)
Admission: RE | Admit: 2020-08-28 | Discharge: 2020-08-28 | Disposition: A | Discharge status: Home / Self Care | Payer: Medicare Other | Source: Ambulatory Visit | Attending: Physician Assistant | Admitting: Physician Assistant

## 2020-08-28 DIAGNOSIS — I252 Old myocardial infarction: Secondary | ICD-10-CM | POA: Diagnosis not present

## 2020-08-28 DIAGNOSIS — C3490 Malignant neoplasm of unspecified part of unspecified bronchus or lung: Secondary | ICD-10-CM | POA: Diagnosis not present

## 2020-08-28 DIAGNOSIS — C3411 Malignant neoplasm of upper lobe, right bronchus or lung: Secondary | ICD-10-CM | POA: Diagnosis not present

## 2020-08-28 DIAGNOSIS — I2510 Atherosclerotic heart disease of native coronary artery without angina pectoris: Secondary | ICD-10-CM | POA: Diagnosis not present

## 2020-08-28 DIAGNOSIS — Z902 Acquired absence of lung [part of]: Secondary | ICD-10-CM | POA: Diagnosis not present

## 2020-08-28 DIAGNOSIS — I700 Atherosclerosis of aorta: Secondary | ICD-10-CM | POA: Diagnosis not present

## 2020-08-28 DIAGNOSIS — J439 Emphysema, unspecified: Secondary | ICD-10-CM | POA: Diagnosis not present

## 2020-08-28 DIAGNOSIS — Z86010 Personal history of colonic polyps: Secondary | ICD-10-CM | POA: Diagnosis not present

## 2020-08-28 DIAGNOSIS — Z85118 Personal history of other malignant neoplasm of bronchus and lung: Secondary | ICD-10-CM | POA: Insufficient documentation

## 2020-08-28 DIAGNOSIS — Z9221 Personal history of antineoplastic chemotherapy: Secondary | ICD-10-CM | POA: Diagnosis not present

## 2020-08-28 HISTORY — DX: Malignant (primary) neoplasm, unspecified: C80.1

## 2020-08-28 LAB — CBC WITH DIFFERENTIAL (CANCER CENTER ONLY)
Abs Immature Granulocytes: 0.02 10*3/uL (ref 0.00–0.07)
Basophils Absolute: 0 10*3/uL (ref 0.0–0.1)
Basophils Relative: 1 %
Eosinophils Absolute: 0.1 10*3/uL (ref 0.0–0.5)
Eosinophils Relative: 1 %
HCT: 37.2 % — ABNORMAL LOW (ref 39.0–52.0)
Hemoglobin: 12.1 g/dL — ABNORMAL LOW (ref 13.0–17.0)
Immature Granulocytes: 0 %
Lymphocytes Relative: 19 %
Lymphs Abs: 1.2 10*3/uL (ref 0.7–4.0)
MCH: 26.9 pg (ref 26.0–34.0)
MCHC: 32.5 g/dL (ref 30.0–36.0)
MCV: 82.9 fL (ref 80.0–100.0)
Monocytes Absolute: 0.6 10*3/uL (ref 0.1–1.0)
Monocytes Relative: 10 %
Neutro Abs: 4.5 10*3/uL (ref 1.7–7.7)
Neutrophils Relative %: 69 %
Platelet Count: 190 10*3/uL (ref 150–400)
RBC: 4.49 MIL/uL (ref 4.22–5.81)
RDW: 14.6 % (ref 11.5–15.5)
WBC Count: 6.5 10*3/uL (ref 4.0–10.5)
nRBC: 0 % (ref 0.0–0.2)

## 2020-08-28 LAB — CMP (CANCER CENTER ONLY)
ALT: 19 U/L (ref 0–44)
AST: 15 U/L (ref 15–41)
Albumin: 3.8 g/dL (ref 3.5–5.0)
Alkaline Phosphatase: 68 U/L (ref 38–126)
Anion gap: 8 (ref 5–15)
BUN: 28 mg/dL — ABNORMAL HIGH (ref 8–23)
CO2: 24 mmol/L (ref 22–32)
Calcium: 9.1 mg/dL (ref 8.9–10.3)
Chloride: 106 mmol/L (ref 98–111)
Creatinine: 1.04 mg/dL (ref 0.61–1.24)
GFR, Est AFR Am: >60 mL/min (ref >60)
GFR, Estimated: >60 mL/min (ref >60)
Glucose, Bld: 109 mg/dL — ABNORMAL HIGH (ref 70–99)
Potassium: 4.3 mmol/L (ref 3.5–5.1)
Sodium: 138 mmol/L (ref 135–145)
Total Bilirubin: 0.6 mg/dL (ref 0.3–1.2)
Total Protein: 6.7 g/dL (ref 6.5–8.1)

## 2020-08-28 MED ORDER — IOHEXOL 300 MG/ML  SOLN
75.0000 mL | Freq: Once | INTRAMUSCULAR | Status: AC | PRN
  Administered 2020-08-28: 75 mL via INTRAVENOUS

## 2020-09-02 ENCOUNTER — Inpatient Hospital Stay (HOSPITAL_BASED_OUTPATIENT_CLINIC_OR_DEPARTMENT_OTHER): Payer: Medicare Other | Admitting: Internal Medicine

## 2020-09-02 ENCOUNTER — Encounter: Payer: Self-pay | Admitting: Internal Medicine

## 2020-09-02 ENCOUNTER — Other Ambulatory Visit: Payer: Self-pay

## 2020-09-02 VITALS — BP 131/54 | HR 60 | Temp 98.2°F | Resp 20 | Ht 68.0 in | Wt 199.6 lb

## 2020-09-02 DIAGNOSIS — Z902 Acquired absence of lung [part of]: Secondary | ICD-10-CM | POA: Diagnosis not present

## 2020-09-02 DIAGNOSIS — C3411 Malignant neoplasm of upper lobe, right bronchus or lung: Secondary | ICD-10-CM | POA: Diagnosis not present

## 2020-09-02 DIAGNOSIS — Z85118 Personal history of other malignant neoplasm of bronchus and lung: Secondary | ICD-10-CM | POA: Diagnosis not present

## 2020-09-02 DIAGNOSIS — Z9221 Personal history of antineoplastic chemotherapy: Secondary | ICD-10-CM | POA: Diagnosis not present

## 2020-09-02 DIAGNOSIS — Z86010 Personal history of colonic polyps: Secondary | ICD-10-CM | POA: Diagnosis not present

## 2020-09-02 DIAGNOSIS — C3490 Malignant neoplasm of unspecified part of unspecified bronchus or lung: Secondary | ICD-10-CM | POA: Diagnosis not present

## 2020-09-02 DIAGNOSIS — I252 Old myocardial infarction: Secondary | ICD-10-CM | POA: Diagnosis not present

## 2020-09-02 NOTE — Progress Notes (Signed)
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 Servando Snare. 2) Adjuvant systemic chemotherapy with carboplatin for AUC of 6 and paclitaxel 200 mg/M2 every 3 weeks with Neulasta support.  First dose February 28, 2020.  Status post 4 cycles.  Last dose was given May 01, 2020.  CURRENT THERAPY: Observation.  INTERVAL HISTORY: Daniel Colon 73 y.o. male returns to the clinic today for follow-up visit accompanied by his wife.  The patient is feeling fine today with no concerning complaints except for shortness of breath with exertion and occasional right-sided chest pain from the surgical scar.  He denied having any cough or hemoptysis.  He denied having any nausea, vomiting, diarrhea or constipation.  He has no headache or visual changes.  He denied having any significant weight loss or night sweats.  The patient had repeat CT scan of the chest performed recently and he is here for evaluation and discussion of his scan results.   MEDICAL HISTORY: Past Medical History:  Diagnosis Date  . Allergy   . Arthritis   . Barrett's esophagus   . CAD (coronary artery disease)    a. Stent to the [facility] (in [place]);  b. ETT(11/13/13): abnormal with early ST changes to suggest ischemia;   c. LHC (11/17/13):  pLAD 30-40, D1 30, mLAD 70-75, mCFX 70, mRCA stent 40 (ISR), EF 55-65%.  Medical Rx  . Cataract    left eye removed, right immature  . Colon polyps   . Dyslipidemia   . GERD (gastroesophageal reflux disease)   . Hypertension   . Myocardial infarction ([place]) 1996  . Nephrolithiasis    kidney stone  . NSCL Ca dx'd 12/2019  . PUD (peptic ulcer disease)     ALLERGIES:   has No Known Allergies.  MEDICATIONS:  Current Outpatient Medications  Medication Sig Dispense Refill  . acetaminophen (TYLENOL) 500 MG tablet Take 500-1,000 mg by mouth every 6 (six) hours as needed (for pain.).    Marland Kitchen aspirin EC 81 MG tablet Take 81 mg by mouth 3 (three) times a week. In the morning    . atorvastatin (LIPITOR) 40 MG tablet TAKE 1 TABLET DAILY 90 tablet 3  . BREZTRI AEROSPHERE 160-9-4.8 MCG/ACT AERO     . BUDESONIDE-FORMOTEROL FUMARATE IN Inhale into the lungs.    . carvedilol (COREG) 12.5 MG tablet Take 12.5 mg by mouth daily.     . Coenzyme Q10 (CO Q-10) 100 MG CAPS Take 100 mg by mouth daily.     Marland Kitchen docusate sodium (COLACE) 100 MG capsule Take 100 mg by mouth daily as needed for mild constipation.    Marland Kitchen ezetimibe (ZETIA) 10 MG tablet Take 10 mg by mouth daily.    . fish oil-omega-3 fatty acids 1000 MG capsule Take 1,000 mg by mouth daily.     Marland Kitchen guaiFENesin (MUCINEX) 600 MG 12 hr tablet Take 1 tablet (600 mg total) by mouth 2 (two) times daily as needed for cough or to loosen phlegm.    Marland Kitchen ipratropium (ATROVENT) 0.03 % nasal spray Place 1 spray into both nostrils daily.     . Multiple Vitamin (MULTIVITAMIN WITH MINERALS) TABS tablet Take 1 tablet  by mouth daily.    . nitroGLYCERIN (NITROLINGUAL) 0.4 MG/SPRAY spray USE 1 SPRAY ON OR UNDER THE TONGUE EVERY 5 MINUTES FOR 3 DOSES AS NEEDED FOR CHEST PAIN AS DIRECTED BY PRESCRIBER OR PACKAGE INSTRUCTIONS 24 g 3  . omeprazole (PRILOSEC) 10 MG capsule Take 10 mg by mouth daily.    Marland Kitchen oxybutynin (DITROPAN) 5 MG tablet Take 5 mg by mouth daily.     . prochlorperazine (COMPAZINE) 10 MG tablet Take 1 tablet (10 mg total) by mouth every 6 (six) hours as needed for nausea or vomiting. 30 tablet 0  . senna (SENOKOT) 176 MG/5ML SYRP Take by mouth.    . Sennosides (SENNA LAXATIVE) 25 MG TABS Take 1 tablet by mouth daily.    Marland Kitchen telmisartan-hydrochlorothiazide (MICARDIS HCT) 80-12.5 MG tablet Take 1 tablet by mouth daily. 90 tablet 3  . traMADol  (ULTRAM) 50 MG tablet Take 1 tablet (50 mg total) by mouth every 6 (six) hours as needed (mild pain). 20 tablet 0   No current facility-administered medications for this visit.    SURGICAL HISTORY:  Past Surgical History:  Procedure Laterality Date  . ANGIOPLASTY     stent - rt cor. art  . BILROTH II PROCEDURE    . COLONOSCOPY    . INTERCOSTAL NERVE BLOCK Right 01/24/2020   Procedure: Intercostal Nerve Block;  Surgeon: Grace Isaac, MD;  Location: [facility];  Service: Thoracic;  Laterality: Right;  . LEFT HEART CATHETERIZATION WITH CORONARY ANGIOGRAM N/A 11/17/2013   Procedure: LEFT HEART CATHETERIZATION WITH CORONARY ANGIOGRAM;  Surgeon: Blane Ohara, MD;  Location: [facility] CATH LAB;  Service: Cardiovascular;  Laterality: N/A;  . LYMPH NODE DISSECTION N/A 01/24/2020   Procedure: LYMPH NODE DISSECTION;  Surgeon: Grace Isaac, MD;  Location: [facility];  Service: Thoracic;  Laterality: N/A;  . POLYPECTOMY    . ulcer surgery    . ULNAR NERVE TRANSPOSITION Right 08/05/2017   Procedure: RIGHT ULNAR NERVE DECOMPRESSION;  Surgeon: Leanora Cover, MD;  Location: [facility];  Service: Orthopedics;  Laterality: Right;  Marland Kitchen VIDEO BRONCHOSCOPY N/A 01/24/2020   Procedure: VIDEO BRONCHOSCOPY;  Surgeon: Grace Isaac, MD;  Location: [facility] OR;  Service: Thoracic;  Laterality: N/A;    REVIEW OF SYSTEMS:  A comprehensive review of systems was negative except for: Respiratory: positive for dyspnea on exertion   PHYSICAL EXAMINATION: General appearance: alert, cooperative, fatigued and no distress Head: Normocephalic, without obvious abnormality, atraumatic Neck: no adenopathy, no JVD, supple, symmetrical, trachea midline and thyroid not enlarged, symmetric, no tenderness/mass/nodules Lymph nodes: Cervical, supraclavicular, and axillary nodes normal. Resp: clear to auscultation bilaterally Back: symmetric, no curvature. ROM normal. No CVA tenderness. Cardio: regular rate and rhythm, S1, S2  normal, no murmur, click, rub or gallop GI: soft, non-tender; bowel sounds normal; no masses,  no organomegaly Extremities: extremities normal, atraumatic, no cyanosis or edema  ECOG PERFORMANCE STATUS: 1 - Symptomatic but completely ambulatory  Blood pressure (!) 131/54, pulse 60, temperature 98.2 F (36.8 C), temperature source Tympanic, resp. rate 20, height 5\' 8"  (1.727 m), weight 199 lb 9.6 oz (90.5 kg), SpO2 99 %.  LABORATORY DATA: Lab Results  Component Value Date   WBC 6.5 08/28/2020   HGB 12.1 (L) 08/28/2020   HCT 37.2 (L) 08/28/2020   MCV 82.9 08/28/2020   PLT 190 08/28/2020      Chemistry      Component Value Date/Time   NA 138 08/28/2020 0828   K 4.3 08/28/2020 1610  CL 106 08/28/2020 0828   CO2 24 08/28/2020 0828   BUN 28 (H) 08/28/2020 0828   CREATININE 1.04 08/28/2020 0828      Component Value Date/Time   CALCIUM 9.1 08/28/2020 0828   ALKPHOS 68 08/28/2020 0828   AST 15 08/28/2020 0828   ALT 19 08/28/2020 0828   BILITOT 0.6 08/28/2020 0828       RADIOGRAPHIC STUDIES: CT Chest W Contrast  Result Date: 08/28/2020 CLINICAL DATA:  Primary Cancer Type: Lung Imaging Indication: Routine surveillance Interval therapy since last imaging? 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 Primary Tumor location: Right upper lobe. Surgeries: Right upper lobectomy 01/24/2020.  Coronary stent. Chemotherapy: Yes; Ongoing? No; Most recent administration: 05/01/2020 Immunotherapy? No Radiation therapy? No EXAM: CT CHEST WITH CONTRAST TECHNIQUE: Multidetector CT imaging of the chest was performed during intravenous contrast administration. CONTRAST:  51mL OMNIPAQUE IOHEXOL 300 MG/ML  SOLN COMPARISON:  Most recent CT chest 05/27/2020.  01/05/2020 PET-CT. FINDINGS: Cardiovascular: Bovine arch. Aortic atherosclerosis. Normal heart size, without pericardial effusion.  Multivessel coronary artery atherosclerosis. No central pulmonary embolism, on this non-dedicated study. Mediastinum/Nodes: No supraclavicular adenopathy. Prominent but not pathologically sized right paratracheal nodes are similar. No hilar adenopathy Lungs/Pleura: Decrease in loculated right apical pleural fluid. Trace air suspected within, including on 29/2 and 37/2. Right upper lobectomy.  No well-defined bronchopleural fistula. Left upper lobe calcified granuloma. 1 mm left lower lobe pulmonary nodule on 106/7, similar. Upper Abdomen: Subcentimeter low-density high left hepatic lobe lesion is likely a cyst. Normal imaged portions of the spleen, pancreas, gallbladder, kidneys. Surgical changes of gastrojejunostomy. Bilateral adrenal thickening with right adrenal nodularity at up to 1.6 cm on 149/2, similar. Musculoskeletal: Lower cervical spondylosis. IMPRESSION: 1. Status post right upper lobectomy. Decrease in loculated right apical pleural fluid with trace pleural air. No well-defined bronchopleural fistula. 2. No findings of recurrent or metastatic disease. Prominent high right paratracheal nodes are similar, favored to be reactive. Recommend attention on follow-up. 3. Right adrenal adenoma. 4. Aortic atherosclerosis (ICD10-I70.0), coronary artery atherosclerosis and emphysema (ICD10-J43.9). Electronically Signed   By: Abigail Miyamoto M.D.   On: 08/28/2020 10:16    ASSESSMENT AND PLAN: This is a very pleasant 74 years old white male recently diagnosed with a stage IIb non-small cell lung cancer with sarcomatoid features in February 2021 status post right upper lobectomy with lymph node dissection under the care of Dr. Servando Snare. The patient is also status post 4 cycles of adjuvant systemic chemotherapy with carboplatin and paclitaxel completed in May 2021. He is currently on observation and feeling fine with no concerning complaints except for mild shortness of breath with exertion. The patient had CT scan  of the chest performed recently.  I personally and independently reviewed the scans and discussed the results with the patient and his wife. His scan showed no concerning findings for disease recurrence or progression. I recommended for the patient to continue on observation with repeat CT scan of the chest in 6 months. He was advised to call immediately if he has any concerning symptoms in the interval.   The patient voices understanding of current disease status and treatment options and is in agreement with the current care plan.  All questions were answered. The patient knows to call the clinic with any problems, questions or concerns. We can certainly see the patient much sooner if necessary.  Disclaimer: This note was dictated with voice recognition software. Similar sounding  words can inadvertently be transcribed and may not be corrected upon review.

## 2020-09-04 ENCOUNTER — Telehealth: Payer: Self-pay | Admitting: Internal Medicine

## 2020-09-04 NOTE — Telephone Encounter (Signed)
Scheduled per los. Called and left msg. Mailed printout  °

## 2020-09-05 ENCOUNTER — Ambulatory Visit: Payer: Medicare Other | Admitting: Cardiothoracic Surgery

## 2020-09-12 ENCOUNTER — Ambulatory Visit (INDEPENDENT_AMBULATORY_CARE_PROVIDER_SITE_OTHER): Payer: Medicare Other | Admitting: Cardiothoracic Surgery

## 2020-09-12 ENCOUNTER — Other Ambulatory Visit: Payer: Self-pay

## 2020-09-12 VITALS — BP 121/72 | HR 67 | Temp 98.1°F | Resp 20 | Ht 68.0 in | Wt 200.0 lb

## 2020-09-12 DIAGNOSIS — C3490 Malignant neoplasm of unspecified part of unspecified bronchus or lung: Secondary | ICD-10-CM | POA: Diagnosis not present

## 2020-09-12 DIAGNOSIS — Z902 Acquired absence of lung [part of]: Secondary | ICD-10-CM | POA: Diagnosis not present

## 2020-09-12 NOTE — Progress Notes (Signed)
[address]       [place] [address]             [phone]                  [name] [place] Medical Record #[id number] Date of Birth: 10-Mar-1946  Referring JI:RCVEL, Lars Masson, NP Primary Cardiology: Primary Care:[place], Daniel Colon  Chief Complaint:  Follow Up Visit OPERATIVE REPORT DATE OF PROCEDURE:  01/24/2020 PREOPERATIVE DIAGNOSIS:  A 2 cm right upper lobe lung mass found on surveillance lung cancer screening CT. POSTOPERATIVE DIAGNOSES:   1.  Malignant neoplasm, right upper lobe, 2.  Sarcomatous changes on frozen section. PROCEDURES PERFORMED:   1.  Bronchoscopy.   2.  Robotic-assisted wedge resection- right upper lobe with completion right upper lobe lobectomy, lymph node dissection, and intercostal nerve block. SURGEON:  Daniel Bal, Colon  Cancer Staging Lung cancer [facility]) Staging form: Lung, AJCC 8th Edition - Pathologic stage from 01/29/2020: Stage IIB (pT1c, pN1, cM0) - Signed by Daniel Isaac, Colon on 01/29/2020  SURGICAL PATHOLOGY  CASE: [id number]  PATIENT: Daniel Colon  Surgical Pathology Report   Clinical History: pulmonary nodule, subpleural nodule in right upper  lobe (cm)   FINAL MICROSCOPIC DIAGNOSIS:   A. LUNG, RIGHT UPPER LOBE, WEDGE RESECTION:  - Poorly differentiated carcinoma with sarcomatoid features, 2.2 cm.  - See oncology table and comment.   B. LYMPH NODE, LEVEL 7, EXCISION:  - Anthracotic lymph node with no metastatic carcinoma.   C. LYMPH NODE, 10R, EXCISION:  - Anthracotic lymph node with no metastatic carcinoma.   D. LYMPH NODE, 10R #2, EXCISION:  - Anthracotic lymph node with no metastatic carcinoma.   E. LYMPH NODE, 12R, EXCISION:  - Anthracotic lymph node with no metastatic carcinoma.   F. LYMPH NODE, 10R #3, EXCISION:  - Anthracotic lymph node with no metastatic carcinoma.   G. LYMPH NODE, 10R #4, EXCISION:  - Anthracotic lymph node with no metastatic carcinoma.   H. LYMPH  NODE, 11R, EXCISION:  - Anthracotic lymph node with no metastatic carcinoma.   I. LYMPH NODE, 11R #2, EXCISION:  - Anthracotic lymph node with no metastatic carcinoma.   J. LYMPH NODE, 11R #3, EXCISION:  - Anthracotic lymph node with no metastatic carcinoma.   K. LYMPH NODE, 11R #4, EXCISION:  - Anthracotic lymph node with no metastatic carcinoma.   L. LUNG, RIGHT UPPER LOBE COMPLETION, LOBECTOMY:  - Findings consistent with previous wedge biopsy.  - Final margins free of tumor.  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   ONCOLOGY TABLE:  LUNG: Resection  Procedure: Right upper lobe wedge biopsy, right upper lobectomy and  lymph node biopsies.  Specimen Laterality: Right  Tumor Site: Right upper lobe.  Tumor Size: 2.2 x 1.5 x 0.8 cm.  Tumor Focality: Unifocal.  Histologic Type: Poorly differentiated carcinoma with f features  (sarcomatoid carcinoma).  Visceral Pleura Invasion: Involves subpleural connective tissue but does  not extend to pleural surface.  Lymphovascular Invasion: Present.  Direct Invasion of Adjacent Structures: No adjacent structures  submitted.  Margins: Free of tumor.  Treatment Effect: No known presurgical therapy.  Regional Lymph Nodes:    Number of Lymph Nodes Involved: 1    Number of Lymph Nodes Examined: 12  Pathologic Stage Classification (pTNM, AJCC 8th Edition): pT1c, pN1  Ancillary Studies: Can be performed if requested.  Representative Tumor Block: A2, A3 and  A4.  Comment(s): The tumor is poorly differentiated with large areas of  diffuse sheets of malignant cells some of which have spindle cell  features with marked nuclear pleomorphism and frequent mitotic figures.  There are areas with epithelioid features with abundant eosinophilic  cytoplasm. Immunohistochemistry shows positivity with cytokeratin  AE1/AE3, cytokeratin 7 and TTF-1. The tumor is negative with  cytokeratin 5/6, p40, p63,  Napsin-A, MOC31, cytokeratin 20, and CDX-2.  The immunophenotype is consistent with poorly differentiated carcinoma  and the morphology shows sarcomatoid features (sarcomatoid carcinoma).   Dr. Tresa Colon has reviewed slides of the tumor and agrees.  ([id number])   History of Present Illness:     Patient returns to the office today with a follow-up CT of the chest after  robotic right upper lobectomy for a poorly differentiated 2 cm right upper lobe mass with single positive hilar lymph node.  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   He has noted paresthesias around the right lower chest and extending to the right upper abdomen.  This has improved some but still present.    With the positive hilar lymph node he was referred to medical oncology-He completed  4 cycles of chemotherapy, Adjuvant systemic chemotherapy with carboplatin for AUC of 6 and paclitaxel 200 mg/M2 every 3 weeks with Neulasta support. First dose February 28, 2020.   The patient was enrolled in pulmonary rehab for period of time .  Now that he is completed pulmonary rehab and chemotherapy he still notes that he becomes dyspneic with exertion. In addition he also notes vague midsternal chest discomfort with exertion, this seems more accentuated than he had complained up in the past.  He does have known coronary occlusive disease with previous stent placed in [facility] while living in [place] in 1996.  Treadmill in November 2014 was abnormal.  He has been medically managed since cardiac catheterization 11/17/2013.    Zubrod Score: At the time of surgery this patient's most appropriate activity status/level should be described as: []     0    Normal activity, no symptoms [x]     1    Restricted in physical strenuous activity but ambulatory, able to do out light work []     2    Ambulatory and capable of self care, unable to do work activities, up and  about                 >50 % of waking hours                                                                                   []     3    Only limited self care, in bed greater than 50% of waking hours []     4    Completely disabled, no self care, confined to bed or chair []     5    Moribund  Social History   Tobacco Use  Smoking Status Former Smoker  . Packs/day: 1.00  . Years: 42.00  . Pack years: 42.00  . Types: Cigarettes  . Quit date: 06/21/2011  .  Years since quitting: 9.2  Smokeless Tobacco Current User  . Types: Chew       No Known Allergies  Current Outpatient Medications  Medication Sig Dispense Refill  . acetaminophen (TYLENOL) 500 MG tablet Take 500-1,000 mg by mouth every 6 (six) hours as needed (for pain.).    Marland Kitchen aspirin EC 81 MG tablet Take 81 mg by mouth 3 (three) times a week. In the morning    . atorvastatin (LIPITOR) 40 MG tablet TAKE 1 TABLET DAILY 90 tablet 3  . BREZTRI AEROSPHERE 160-9-4.8 MCG/ACT AERO     . BUDESONIDE-FORMOTEROL FUMARATE IN Inhale into the lungs.    . carvedilol (COREG) 12.5 MG tablet Take 12.5 mg by mouth daily.     . Coenzyme Q10 (CO Q-10) 100 MG CAPS Take 100 mg by mouth daily.     Marland Kitchen docusate sodium (COLACE) 100 MG capsule Take 100 mg by mouth daily as needed for mild constipation.    Marland Kitchen ezetimibe (ZETIA) 10 MG tablet Take 10 mg by mouth daily.    . fish oil-omega-3 fatty acids 1000 MG capsule Take 1,000 mg by mouth daily.     Marland Kitchen guaiFENesin (MUCINEX) 600 MG 12 hr tablet Take 1 tablet (600 mg total) by mouth 2 (two) times daily as needed for cough or to loosen phlegm.    Marland Kitchen ipratropium (ATROVENT) 0.03 % nasal spray Place 1 spray into both nostrils daily.     . Multiple Vitamin (MULTIVITAMIN WITH MINERALS) TABS tablet Take 1 tablet by mouth daily.    . nitroGLYCERIN (NITROLINGUAL) 0.4 MG/SPRAY spray USE 1 SPRAY ON OR UNDER THE TONGUE EVERY 5 MINUTES FOR 3 DOSES AS NEEDED FOR CHEST PAIN AS DIRECTED BY PRESCRIBER OR PACKAGE INSTRUCTIONS 24 g 3   . omeprazole (PRILOSEC) 10 MG capsule Take 10 mg by mouth daily.    . Sennosides (SENNA LAXATIVE) 25 MG TABS Take 1 tablet by mouth daily.    Marland Kitchen telmisartan-hydrochlorothiazide (MICARDIS HCT) 80-12.5 MG tablet Take 1 tablet by mouth daily. 90 tablet 3  . traMADol (ULTRAM) 50 MG tablet Take 1 tablet (50 mg total) by mouth every 6 (six) hours as needed (mild pain). 20 tablet 0  . oxybutynin (DITROPAN) 5 MG tablet Take 5 mg by mouth daily.  (Patient not taking: Reported on 09/12/2020)    . prochlorperazine (COMPAZINE) 10 MG tablet Take 1 tablet (10 mg total) by mouth every 6 (six) hours as needed for nausea or vomiting. (Patient not taking: Reported on 09/12/2020) 30 tablet 0  . senna (SENOKOT) 176 MG/5ML SYRP Take by mouth. (Patient not taking: Reported on 09/12/2020)     No current facility-administered medications for this visit.       Physical Exam: BP 121/72   Pulse 67   Temp 98.1 F (36.7 C) (Skin)   Resp 20   Ht 5\' 8"  (1.727 m)   Wt 200 lb (90.7 kg)   SpO2 92% Comment: RA  BMI 30.41 kg/m  General appearance: alert, cooperative and no distress Head: Normocephalic, without obvious abnormality, atraumatic Neck: no adenopathy, no carotid bruit, no JVD, supple, symmetrical, trachea midline and thyroid not enlarged, symmetric, no tenderness/mass/nodules Lymph nodes: Cervical, supraclavicular, and axillary nodes normal. Resp: clear to auscultation bilaterally Cardio: regular rate and rhythm, S1, S2 normal, no murmur, click, rub or gallop GI: soft, non-tender; bowel sounds normal; no masses,  no organomegaly Extremities: extremities normal, atraumatic, no cyanosis or edema and Homans sign is negative, no sign of DVT Neurologic: Grossly normal  Diagnostic Studies & Laboratory data:         Recent Radiology Findings: CT Chest W Contrast  Result Date: 08/28/2020 CLINICAL DATA:  Primary Cancer Type: Lung Imaging Indication: Routine surveillance Interval therapy since last imaging? 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 Primary Tumor location: Right upper lobe. Surgeries: Right upper lobectomy 01/24/2020.  Coronary stent. Chemotherapy: Yes; Ongoing? No; Most recent administration: 05/01/2020 Immunotherapy? No Radiation therapy? No EXAM: CT CHEST WITH CONTRAST TECHNIQUE: Multidetector CT imaging of the chest was performed during intravenous contrast administration. CONTRAST:  39mL OMNIPAQUE IOHEXOL 300 MG/ML  SOLN COMPARISON:  Most recent CT chest 05/27/2020.  01/05/2020 PET-CT. FINDINGS: Cardiovascular: Bovine arch. Aortic atherosclerosis. Normal heart size, without pericardial effusion. Multivessel coronary artery atherosclerosis. No central pulmonary embolism, on this non-dedicated study. Mediastinum/Nodes: No supraclavicular adenopathy. Prominent but not pathologically sized right paratracheal nodes are similar. No hilar adenopathy Lungs/Pleura: Decrease in loculated right apical pleural fluid. Trace air suspected within, including on 29/2 and 37/2. Right upper lobectomy.  No well-defined bronchopleural fistula. Left upper lobe calcified granuloma. 1 mm left lower lobe pulmonary nodule on 106/7, similar. Upper Abdomen: Subcentimeter low-density high left hepatic lobe lesion is likely a cyst. Normal imaged portions of the spleen, pancreas, gallbladder, kidneys. Surgical changes of gastrojejunostomy. Bilateral adrenal thickening with right adrenal nodularity at up to 1.6 cm on 149/2, similar. Musculoskeletal: Lower cervical spondylosis. IMPRESSION: 1. Status post right upper lobectomy. Decrease in loculated right apical pleural fluid with trace pleural air. No well-defined bronchopleural fistula. 2. No findings of recurrent or metastatic disease. Prominent high right paratracheal nodes are similar, favored to be reactive. Recommend attention on follow-up. 3. Right adrenal  adenoma. 4. Aortic atherosclerosis (ICD10-I70.0), coronary artery atherosclerosis and emphysema (ICD10-J43.9). Electronically Signed   By: Abigail Miyamoto M.D.   On: 08/28/2020 10:16    I have independently reviewed the above radiology findings and reviewed findings  with the patient.  Recent Labs: Lab Results  Component Value Date   WBC 6.5 08/28/2020   HGB 12.1 (L) 08/28/2020   HCT 37.2 (L) 08/28/2020   PLT 190 08/28/2020   GLUCOSE 109 (H) 08/28/2020   ALT 19 08/28/2020   AST 15 08/28/2020   NA 138 08/28/2020   K 4.3 08/28/2020   CL 106 08/28/2020   CREATININE 1.04 08/28/2020   BUN 28 (H) 08/28/2020   CO2 24 08/28/2020   INR 1.0 01/22/2020      Assessment / Plan:   #1 patient stable after robotic right upper lobectomy and lymph node dissection without with what ultimately was found to be poorly differentiated carcinoma with sarcomatous features,-stage IIb.  Now completed adjuvant systemic chemotherapy with carboplatin for AUC of 6 and paclitaxel 200 mg/M2 every 3 weeks with Neulasta support. -No evidence of recurrence on follow-up CT scan September 2021-plan to see the patient back in 6 months after follow-up CT scan already ordered by oncology.  #2 known coronary occlusive disease-currently the patient has complaints of both dyspnea on exertion which obviously may be related to his loss of lung function but in addition now also has associated substernal discomfort-he has known coronary occlusive disease but not formally evaluated since 2014.  Will refer him back to be seen by Dr. Percival Spanish with the question of new anginal symptoms.  1      Medication Changes: No orders of the defined types were placed in this encounter.  Daniel Colon 09/12/2020 5:10 PM

## 2020-09-25 ENCOUNTER — Other Ambulatory Visit: Payer: Self-pay | Admitting: *Deleted

## 2020-09-25 MED ORDER — TELMISARTAN-HCTZ 80-12.5 MG PO TABS: 1.0000 | ORAL_TABLET | Freq: Every day | ORAL | 3 refills | Status: DC

## 2020-10-02 DIAGNOSIS — Z23 Encounter for immunization: Secondary | ICD-10-CM | POA: Diagnosis not present

## 2020-10-13 NOTE — Addendum Note (Signed)
Encounter addended by: Rowe Pavy, RN on: 10/13/2020 4:49 PM  Actions taken: Episode deleted, Flowsheet data copied forward, Flowsheet accepted, Clinical Note Signed

## 2020-10-13 NOTE — Progress Notes (Signed)
Discharge Progress Report  Patient Details  Name: Daniel Colon MRN: [id number] Date of Birth: 1946-08-23 Referring Provider:     Pulmonary Rehab Walk Test from 06/05/2020 in [facility]  Referring Provider Grace Isaac, MD       Number of Visits: 17  Reason for Discharge:  Patient reached a stable level of exercise. Patient independent in their exercise. Patient has met program and personal goals.  Smoking History:  Social History   Tobacco Use  Smoking Status Former Smoker  . Packs/day: 1.00  . Years: 42.00  . Pack years: 42.00  . Types: Cigarettes  . Quit date: 06/21/2011  . Years since quitting: 9.3  Smokeless Tobacco Current User  . Types: Chew    Diagnosis:  No diagnosis found.  ADL [facility]SD:  Pulmonary Assessment Scores    Row Name 06/05/20 1128 08/08/20 1421       ADL UCSD   ADL Phase Entry --    SOB Score total 26 15      CAT Score   CAT Score 18 9      mMRC Score   mMRC Score 3 1           Initial Exercise Prescription:  Initial Exercise Prescription - 06/05/20 1100      Date of Initial Exercise RX and Referring Provider   Date 06/05/20    Referring Provider Grace Isaac, MD      Treadmill   MPH 2.2    Grade 0    Minutes 15    METs 2.68      NuStep   Level 3    SPM 85    Minutes 15    METs 2.6      Prescription Details   Frequency (times per week) 2    Duration Progress to 30 minutes of continuous aerobic without signs/symptoms of physical distress      Intensity   THRR 40-80% of Max Heartrate 60-119    Ratings of Perceived Exertion 11-13    Perceived Dyspnea 0-4      Progression   Progression Continue to progress workloads to maintain intensity without signs/symptoms of physical distress.      Resistance Training   Training Prescription Yes    Weight --   blue bands   Reps 10-15           Discharge Exercise Prescription (Final Exercise Prescription Changes):  Exercise  Prescription Changes - 07/30/20 1500      Response to Exercise   Blood Pressure (Admit) 118/60    Blood Pressure (Exercise) 150/50    Blood Pressure (Exit) 120/66    Heart Rate (Admit) 60 bpm    Heart Rate (Exercise) 102 bpm    Heart Rate (Exit) 66 bpm    Oxygen Saturation (Admit) 97 %    Oxygen Saturation (Exercise) 90 %    Oxygen Saturation (Exit) 96 %    Rating of Perceived Exertion (Exercise) 13    Perceived Dyspnea (Exercise) 2    Duration Continue with 30 min of aerobic exercise without signs/symptoms of physical distress.    Intensity THRR unchanged      Progression   Progression Continue to progress workloads to maintain intensity without signs/symptoms of physical distress.    Average METs 4.5      Resistance Training   Training Prescription Yes    Weight blue bands    Reps 10-15    Time 10 Minutes  Treadmill   MPH 3    Grade 2    Minutes 15      NuStep   Level 5    SPM 85    Minutes 15    METs 4.5           Functional Capacity:  6 Minute Walk    Row Name 06/05/20 1135 08/08/20 1416       6 Minute Walk   Phase Initial Discharge    Distance 1293 feet 1755 feet    Distance % Change -- 35.73 %    Distance Feet Change -- 462 ft    Walk Time 6 minutes 6 minutes    # of Rest Breaks 0 0    MPH 2.45 3.32    METS 2.73 3.54    RPE 9 12    Perceived Dyspnea  1 1    VO2 Peak 9.54 12.4    Symptoms Yes (comment) No    Comments Mild SOB. --    Resting HR 56 bpm 68 bpm    Resting BP 126/70 122/66    Resting Oxygen Saturation  97 % 98 %    Exercise Oxygen Saturation  during 6 min walk 93 % 89 %    Max Ex. HR 119 bpm 99 bpm    Max Ex. BP 118/56 148/62    2 Minute Post BP 112/60 140/64      Interval HR   1 Minute HR 108 81    2 Minute HR 116 88    3 Minute HR 119 88    4 Minute HR 106 91    5 Minute HR 114 95    6 Minute HR 95 99    2 Minute Post HR 66 80    Interval Heart Rate? Yes Yes      Interval Oxygen   Interval Oxygen? Yes Yes     Baseline Oxygen Saturation % 97 % 98 %    1 Minute Oxygen Saturation % 96 % 95 %    1 Minute Liters of Oxygen 0 L 0 L    2 Minute Oxygen Saturation % 95 % 90 %    2 Minute Liters of Oxygen 0 L 0 L    3 Minute Oxygen Saturation % 94 % 90 %    3 Minute Liters of Oxygen 0 L 0 L    4 Minute Oxygen Saturation % 94 % 89 %    4 Minute Liters of Oxygen 0 L 0 L    5 Minute Oxygen Saturation % 94 % 89 %    5 Minute Liters of Oxygen 0 L 0 L    6 Minute Oxygen Saturation % 93 % 90 %    6 Minute Liters of Oxygen 0 L 0 L    2 Minute Post Oxygen Saturation % 98 % 98 %    2 Minute Post Liters of Oxygen 0 L 0 L           Psychological, QOL, Others - Outcomes: PHQ 2/9: Depression screen [facility] 2/9 08/08/2020 06/05/2020  Decreased Interest 0 0  Down, Depressed, Hopeless 0 0  PHQ - 2 Score 0 0  Altered sleeping 0 0  Tired, decreased energy 0 0  Change in appetite 0 0  Feeling bad or failure about yourself  0 0  Trouble concentrating 0 0  Moving slowly or fidgety/restless 0 0  Suicidal thoughts 0 0  PHQ-9 Score 0 -  Difficult doing work/chores Not  difficult at all Not difficult at all    Quality of Life:   Personal Goals: Goals established at orientation with interventions provided to work toward goal.  Personal Goals and Risk Factors at Admission - 06/05/20 1107      Core Components/Risk Factors/Patient Goals on Admission   Improve shortness of breath with ADL's Yes    Intervention Provide education, individualized exercise plan and daily activity instruction to help decrease symptoms of SOB with activities of daily living.    Expected Outcomes Short Term: Improve cardiorespiratory fitness to achieve a reduction of symptoms when performing ADLs;Long Term: Be able to perform more ADLs without symptoms or delay the onset of symptoms            Personal Goals Discharge:  Goals and Risk Factor Review    Row Name 06/05/20 1107 07/02/20 0925 07/31/20 1519 10/13/20 1636       Core  Components/Risk Factors/Patient Goals Review   Personal Goals Review Increase knowledge of respiratory medications and ability to use respiratory devices properly.;Improve shortness of breath with ADL's;Develop more efficient breathing techniques such as purse lipped breathing and diaphragmatic breathing and practicing self-pacing with activity. Increase knowledge of respiratory medications and ability to use respiratory devices properly.;Improve shortness of breath with ADL's;Develop more efficient breathing techniques such as purse lipped breathing and diaphragmatic breathing and practicing self-pacing with activity. Increase knowledge of respiratory medications and ability to use respiratory devices properly.;Improve shortness of breath with ADL's;Develop more efficient breathing techniques such as purse lipped breathing and diaphragmatic breathing and practicing self-pacing with activity. --    Review -- Has progressed very well, he plays 18 holes of golf before coming to exercise and is on level 4 of the nustep @ 3.5 mets, and walking on the treadmill @ 2.5 mph and no incline. Daniel Colon is doing very well both here at Pulmonary rehab and with consistent home exercise. Pt plans to continue to work on efficent breathing techniques such as PLB.  Will continue to provide verbal cues and reminders. Daniel Colon completed 17 exercise sessions.  Pt post assessments showed knowledge test remained unchanged 16/18 although pt missed different questions;Dyspnea scale decreased from 26 to 15;COPD assessment also showed decreased from 18 to 9.    Expected Outcomes -- See admission goals. See admission goals. Pt med admission goals.           Exercise Goals and Review:  Exercise Goals    Row Name 06/05/20 1153 07/02/20 0804 07/30/20 1510         Exercise Goals   Increase Physical Activity Yes Yes Yes     Intervention Provide advice, education, support and counseling about physical activity/exercise needs.;Develop an  individualized exercise prescription for aerobic and resistive training based on initial evaluation findings, risk stratification, comorbidities and participant's personal goals. Provide advice, education, support and counseling about physical activity/exercise needs.;Develop an individualized exercise prescription for aerobic and resistive training based on initial evaluation findings, risk stratification, comorbidities and participant's personal goals. Provide advice, education, support and counseling about physical activity/exercise needs.;Develop an individualized exercise prescription for aerobic and resistive training based on initial evaluation findings, risk stratification, comorbidities and participant's personal goals.     Expected Outcomes Short Term: Attend rehab on a regular basis to increase amount of physical activity.;Long Term: Exercising regularly at least 3-5 days a week.;Long Term: Add in home exercise to make exercise part of routine and to increase amount of physical activity. Short Term: Attend rehab on a regular basis to increase  amount of physical activity.;Long Term: Exercising regularly at least 3-5 days a week.;Long Term: Add in home exercise to make exercise part of routine and to increase amount of physical activity. Short Term: Attend rehab on a regular basis to increase amount of physical activity.;Long Term: Add in home exercise to make exercise part of routine and to increase amount of physical activity.;Long Term: Exercising regularly at least 3-5 days a week.     Increase Strength and Stamina Yes Yes Yes     Intervention Provide advice, education, support and counseling about physical activity/exercise needs.;Develop an individualized exercise prescription for aerobic and resistive training based on initial evaluation findings, risk stratification, comorbidities and participant's personal goals. Provide advice, education, support and counseling about physical activity/exercise  needs.;Develop an individualized exercise prescription for aerobic and resistive training based on initial evaluation findings, risk stratification, comorbidities and participant's personal goals. Provide advice, education, support and counseling about physical activity/exercise needs.;Develop an individualized exercise prescription for aerobic and resistive training based on initial evaluation findings, risk stratification, comorbidities and participant's personal goals.     Expected Outcomes Short Term: Increase workloads from initial exercise prescription for resistance, speed, and METs.;Short Term: Perform resistance training exercises routinely during rehab and add in resistance training at home;Long Term: Improve cardiorespiratory fitness, muscular endurance and strength as measured by increased METs and functional capacity (6MWT) Short Term: Increase workloads from initial exercise prescription for resistance, speed, and METs.;Short Term: Perform resistance training exercises routinely during rehab and add in resistance training at home;Long Term: Improve cardiorespiratory fitness, muscular endurance and strength as measured by increased METs and functional capacity (6MWT) Short Term: Increase workloads from initial exercise prescription for resistance, speed, and METs.;Short Term: Perform resistance training exercises routinely during rehab and add in resistance training at home;Long Term: Improve cardiorespiratory fitness, muscular endurance and strength as measured by increased METs and functional capacity (6MWT)     Able to understand and use rate of perceived exertion (RPE) scale Yes Yes Yes     Intervention Provide education and explanation on how to use RPE scale Provide education and explanation on how to use RPE scale Provide education and explanation on how to use RPE scale     Expected Outcomes Short Term: Able to use RPE daily in rehab to express subjective intensity level;Long Term:  Able to  use RPE to guide intensity level when exercising independently Short Term: Able to use RPE daily in rehab to express subjective intensity level;Long Term:  Able to use RPE to guide intensity level when exercising independently Short Term: Able to use RPE daily in rehab to express subjective intensity level;Long Term:  Able to use RPE to guide intensity level when exercising independently     Able to understand and use Dyspnea scale Yes Yes Yes     Intervention Provide education and explanation on how to use Dyspnea scale Provide education and explanation on how to use Dyspnea scale Provide education and explanation on how to use Dyspnea scale     Expected Outcomes Short Term: Able to use Dyspnea scale daily in rehab to express subjective sense of shortness of breath during exertion;Long Term: Able to use Dyspnea scale to guide intensity level when exercising independently Short Term: Able to use Dyspnea scale daily in rehab to express subjective sense of shortness of breath during exertion;Long Term: Able to use Dyspnea scale to guide intensity level when exercising independently Short Term: Able to use Dyspnea scale daily in rehab to express subjective sense of  shortness of breath during exertion;Long Term: Able to use Dyspnea scale to guide intensity level when exercising independently     Knowledge and understanding of Target Heart Rate Range (THRR) Yes Yes Yes     Intervention Provide education and explanation of THRR including how the numbers were predicted and where they are located for reference Provide education and explanation of THRR including how the numbers were predicted and where they are located for reference Provide education and explanation of THRR including how the numbers were predicted and where they are located for reference     Expected Outcomes Short Term: Able to state/look up THRR;Long Term: Able to use THRR to govern intensity when exercising independently;Short Term: Able to use  daily as guideline for intensity in rehab Short Term: Able to state/look up THRR;Long Term: Able to use THRR to govern intensity when exercising independently;Short Term: Able to use daily as guideline for intensity in rehab Short Term: Able to state/look up THRR;Long Term: Able to use THRR to govern intensity when exercising independently;Short Term: Able to use daily as guideline for intensity in rehab     Understanding of Exercise Prescription Yes Yes Yes     Intervention Provide education, explanation, and written materials on patient's individual exercise prescription Provide education, explanation, and written materials on patient's individual exercise prescription Provide education, explanation, and written materials on patient's individual exercise prescription     Expected Outcomes Short Term: Able to explain program exercise prescription;Long Term: Able to explain home exercise prescription to exercise independently Short Term: Able to explain program exercise prescription;Long Term: Able to explain home exercise prescription to exercise independently Short Term: Able to explain program exercise prescription;Long Term: Able to explain home exercise prescription to exercise independently            Exercise Goals Re-Evaluation:  Exercise Goals Re-Evaluation    Row Name 07/02/20 0804 07/30/20 1507           Exercise Goal Re-Evaluation   Exercise Goals Review Increase Physical Activity;Increase Strength and Stamina;Able to understand and use rate of perceived exertion (RPE) scale;Able to understand and use Dyspnea scale;Knowledge and understanding of Target Heart Rate Range (THRR);Understanding of Exercise Prescription Increase Physical Activity;Increase Strength and Stamina;Able to understand and use rate of perceived exertion (RPE) scale;Able to understand and use Dyspnea scale;Knowledge and understanding of Target Heart Rate Range (THRR);Understanding of Exercise Prescription       Comments Pt has completed 6 exercise sessions. He is extremely active and exercises everyday. He is progressing well and is a pleasure to be around. He currently exercises at 3.5 METs on the stepper. Will continue to monitor and progress as able. pt has completed 15 exercise sessions with pulmonary rehab. He is very independent with his exercise prescription and continues to progress intensity each session. He also is very active outside of pulmonary rehab and plays golf 3 times per week. His METs on the NuStep are 4.5. We will continue to monitor and progress as the pt is able.      Expected Outcomes Through exercise at rehab and at home, the patient will decrease shortness of breath with daily activities and feel confident in carrying out an exercise regime at home. Through exercise at pulmonary rehab and home the patient will decrease shortness of breath with daily activities and feel confident in carrying out an exercise prescription at home             Nutrition & Weight - Outcomes:  Pre Biometrics -  06/05/20 1211      Pre Biometrics   Grip Strength 33 kg           Post Biometrics - 08/08/20 1409       Post  Biometrics   Grip Strength 47 kg           Nutrition:  Nutrition Therapy & Goals - 06/13/20 1436      Nutrition Therapy   Diet Heart Healthy      Personal Nutrition Goals   Nutrition Goal Pt to identify food quantities necessary to achieve weight loss of 6-24 lb at graduation from pulmonary rehab.      Intervention Plan   Intervention Prescribe, educate and counsel regarding individualized specific dietary modifications aiming towards targeted core components such as weight, hypertension, lipid management, diabetes, heart failure and other comorbidities.    Expected Outcomes Short Term Goal: Understand basic principles of dietary content, such as calories, fat, sodium, cholesterol and nutrients.           Nutrition Discharge:  Nutrition Assessments - 08/27/20 0809       Rate Your Plate Scores   Pre Score 45    Post Score 49           Education Questionnaire Score:  Knowledge Questionnaire Score - 08/08/20 1422      Knowledge Questionnaire Score   Post Score 16/18           Goals reviewed with patient. Cherre Huger, BSN Cardiac and Training and development officer

## 2020-10-16 NOTE — Progress Notes (Signed)
Cardiology Office Note   Date:  10/17/2020   ID:  Kjuan, Seipp 09-11-1946, MRN [id number]  PCP:  Leanna Battles, MD  Cardiologist:   No primary care provider on file.   Chief Complaint  Patient presents with  . Shortness of Breath      History of Present Illness: Daniel Colon is a 74 y.o. male who presents for follow up of  CAD, s/p stent to RCA in 1996 in [place].  RoutineETT(11/13/13) was abnormal with early ST changes to suggest ischemia. He was set up had LHC (11/17/13): pLAD 30-40, D1 30, mLAD 70-75, mCFX 70, mRCA stent 40 (ISR), EF 55-65%.  He was managed medically.  He returns for follow up.   Since I saw him he had lung right upper lobe resection.  He completed  4 cycles of chemotherapy, Adjuvant systemic chemotherapy with carboplatin for AUC of 6 and paclitaxel 200 mg/M2 every 3 weeks with Neulasta support.     He has had shortness of breath really since his surgery.  He thought he had gotten a little bit better with pulmonary rehab but he really had a plateau and has been able to get beyond that.  He short of breath up any incline like a flight of stairs.  He has to stop and rest a little bit.  He is okay on level ground and he is not describing PND or orthopnea.  He says his legs get fatigued going up the stairs as well and he is not sure whether his problem is more shortness of breath or leg fatigue.  He denies any chest pressure, neck or arm discomfort.  He does not feel any new palpitations, presyncope or syncope.  Of note he was seen by pulmonary in July when he had some airway obstruction.  He was to follow-up with them around this time.  He was treated with Judithann Sauger  Past Medical History:  Diagnosis Date  . Allergy   . Arthritis   . Barrett's esophagus   . CAD (coronary artery disease)    a. Stent to the [place] (in [place]);  b. ETT(11/13/13): abnormal with early ST changes to suggest ischemia;   c. LHC (11/17/13):  pLAD 30-40, D1 30, mLAD 70-75, mCFX  70, mRCA stent 40 (ISR), EF 55-65%.  Medical Rx  . Cataract    left eye removed, right immature  . Colon polyps   . Dyslipidemia   . GERD (gastroesophageal reflux disease)   . Hypertension   . Myocardial infarction ([place]) 1996  . Nephrolithiasis    kidney stone  . NSCL Ca dx'd 12/2019  . PUD (peptic ulcer disease)     Past Surgical History:  Procedure Laterality Date  . ANGIOPLASTY     stent - rt cor. art  . BILROTH II PROCEDURE    . COLONOSCOPY    . INTERCOSTAL NERVE BLOCK Right 01/24/2020   Procedure: Intercostal Nerve Block;  Surgeon: Grace Isaac, MD;  Location: [place];  Service: Thoracic;  Laterality: Right;  . LEFT HEART CATHETERIZATION WITH CORONARY ANGIOGRAM N/A 11/17/2013   Procedure: LEFT HEART CATHETERIZATION WITH CORONARY ANGIOGRAM;  Surgeon: Blane Ohara, MD;  Location: [facility] CATH LAB;  Service: Cardiovascular;  Laterality: N/A;  . LYMPH NODE DISSECTION N/A 01/24/2020   Procedure: LYMPH NODE DISSECTION;  Surgeon: Grace Isaac, MD;  Location: [place];  Service: Thoracic;  Laterality: N/A;  . POLYPECTOMY    . ulcer surgery    . ULNAR NERVE  TRANSPOSITION Right 08/05/2017   Procedure: RIGHT ULNAR NERVE DECOMPRESSION;  Surgeon: Leanora Cover, MD;  Location: [place];  Service: Orthopedics;  Laterality: Right;  Marland Kitchen VIDEO BRONCHOSCOPY N/A 01/24/2020   Procedure: VIDEO BRONCHOSCOPY;  Surgeon: Grace Isaac, MD;  Location: [facility];  Service: Thoracic;  Laterality: N/A;     Current Outpatient Medications  Medication Sig Dispense Refill  . acetaminophen (TYLENOL) 500 MG tablet Take 500-1,000 mg by mouth every 6 (six) hours as needed (for pain.).    Marland Kitchen aspirin EC 81 MG tablet Take 81 mg by mouth 3 (three) times a week. In the morning    . atorvastatin (LIPITOR) 40 MG tablet TAKE 1 TABLET DAILY 90 tablet 3  . BREZTRI AEROSPHERE 160-9-4.8 MCG/ACT AERO     . carvedilol (COREG) 12.5 MG tablet Take 12.5 mg by mouth daily.     . Coenzyme Q10 (CO Q-10) 100 MG CAPS  Take 100 mg by mouth daily.     Marland Kitchen docusate sodium (COLACE) 100 MG capsule Take 100 mg by mouth daily as needed for mild constipation.    Marland Kitchen ezetimibe (ZETIA) 10 MG tablet Take 10 mg by mouth daily.    . fish oil-omega-3 fatty acids 1000 MG capsule Take 1,000 mg by mouth daily.     Marland Kitchen guaiFENesin (MUCINEX) 600 MG 12 hr tablet Take 1 tablet (600 mg total) by mouth 2 (two) times daily as needed for cough or to loosen phlegm.    Marland Kitchen ipratropium (ATROVENT) 0.03 % nasal spray Place 1 spray into both nostrils daily.     . Multiple Vitamin (MULTIVITAMIN WITH MINERALS) TABS tablet Take 1 tablet by mouth daily.    . nitroGLYCERIN (NITROLINGUAL) 0.4 MG/SPRAY spray USE 1 SPRAY ON OR UNDER THE TONGUE EVERY 5 MINUTES FOR 3 DOSES AS NEEDED FOR CHEST PAIN AS DIRECTED BY PRESCRIBER OR PACKAGE INSTRUCTIONS 24 g 3  . omeprazole (PRILOSEC) 10 MG capsule Take 10 mg by mouth daily.    Marland Kitchen senna (SENOKOT) 176 MG/5ML SYRP Take by mouth.     . Sennosides (SENNA LAXATIVE) 25 MG TABS Take 1 tablet by mouth daily.    Marland Kitchen telmisartan-hydrochlorothiazide (MICARDIS HCT) 80-12.5 MG tablet Take 1 tablet by mouth daily. 90 tablet 3   No current facility-administered medications for this visit.    Allergies:   Patient has no known allergies.    ROS:  Please see the history of present illness.   Otherwise, review of systems are positive for none.   All other systems are reviewed and negative.    PHYSICAL EXAM: VS:  BP 126/71   Pulse (!) 59   Ht 5\' 8"  (1.727 m)   Wt 200 lb 6.4 oz (90.9 kg)   SpO2 98%   BMI 30.47 kg/m  , BMI Body mass index is 30.47 kg/m. GENERAL:  Well appearing NECK:  No jugular venous distention, waveform within normal limits, carotid upstroke brisk and symmetric, no bruits, no thyromegaly LUNGS:  Clear to auscultation bilaterally CHEST: Well-healed surgical scar HEART:  PMI not displaced or sustained,S1 and S2 within normal limits, no S3, no S4, no clicks, no rubs, no murmurs ABD:  Flat, positive bowel  sounds normal in frequency in pitch, no bruits, no rebound, no guarding, no midline pulsatile mass, no hepatomegaly, no splenomegaly EXT:  2 plus pulses throughout, no edema, no cyanosis no clubbing  EKG:  EKG is  ordered today. The ekg ordered today demonstrates sinus rhythm, rate 59, axis within normal limits, intervals  within normal limits, inferolateral ST depression noted on previous EKGs   Recent Labs: 08/28/2020: ALT 19; BUN 28; Creatinine 1.04; Hemoglobin 12.1; Platelet Count 190; Potassium 4.3; Sodium 138    Lipid Panel No results found for: CHOL, TRIG, HDL, CHOLHDL, VLDL, LDLCALC, LDLDIRECT    Wt Readings from Last 3 Encounters:  10/17/20 200 lb 6.4 oz (90.9 kg)  09/12/20 200 lb (90.7 kg)  09/02/20 199 lb 9.6 oz (90.5 kg)      Other studies Reviewed: Additional studies/ records that were reviewed today include: Hospital records, labs and PFTs. . Review of the above records demonstrates:  Please see elsewhere in the note.     ASSESSMENT AND PLAN:  CAD:  He has no chest pain and I am not thinking that his current symptoms are an anginal equivalent.  However, if he does not improve and there is no clear pulmonary etiology I would probably do an ischemia evaluation.   DYSLIPIDEMIA: LDL was  was 61 with an HDL of 43 last November.  No change in therapy.   HTN: Blood pressures is controlled.  No change in therapy.   SOB: I am going to check a BNP level and an echocardiogram.  I am also going to reestablish his follow-up with pulmonary.  If the BNP and echo are normal and pulmonary has no further thoughts about this being primarily lung I will consider an ischemia work-up.  Current medicines are reviewed at length with the patient today.  The patient does not have concerns regarding medicines.  The following changes have been made:  no change  Labs/ tests ordered today include:    Orders Placed This Encounter  Procedures  . B Nat Peptide  . EKG 12-Lead  .  ECHOCARDIOGRAM COMPLETE     Disposition:   FU with me in 2 months.     Signed, Minus Breeding, MD  10/17/2020 5:09 PM    [facility]

## 2020-10-17 ENCOUNTER — Other Ambulatory Visit: Payer: Self-pay

## 2020-10-17 ENCOUNTER — Ambulatory Visit (INDEPENDENT_AMBULATORY_CARE_PROVIDER_SITE_OTHER): Payer: Medicare Other | Admitting: Cardiology

## 2020-10-17 ENCOUNTER — Encounter: Payer: Self-pay | Admitting: Cardiology

## 2020-10-17 VITALS — BP 126/71 | HR 59 | Ht 68.0 in | Wt 200.4 lb

## 2020-10-17 DIAGNOSIS — I2510 Atherosclerotic heart disease of native coronary artery without angina pectoris: Secondary | ICD-10-CM | POA: Diagnosis not present

## 2020-10-17 DIAGNOSIS — R0602 Shortness of breath: Secondary | ICD-10-CM

## 2020-10-17 DIAGNOSIS — E785 Hyperlipidemia, unspecified: Secondary | ICD-10-CM

## 2020-10-17 NOTE — Patient Instructions (Addendum)
Medication Instructions:  No changes *If you need a refill on your cardiac medications before your next appointment, please call your pharmacy*   Lab Work: BNP to be added to the other blood work you are going to have soon. We will provide you the written lab orders. If you have labs (blood work) drawn today and your tests are completely normal, you will receive your results only by: Marland Kitchen MyChart Message (if you have MyChart) OR . A paper copy in the mail If you have any lab test that is abnormal or we need to change your treatment, we will call you to review the results.   Testing/Procedures: Your physician has requested that you have an echocardiogram. Echocardiography is a painless test that uses sound waves to create images of your heart. It provides your doctor with information about the size and shape of your heart and how well your heart's chambers and valves are working. This procedure takes approximately one hour. There are no restrictions for this procedure. This test is performed at [address].     Follow-Up: At [facility], you and your health needs are our priority.  As part of our continuing mission to provide you with exceptional heart care, we have created designated Provider Care Teams.  These Care Teams include your primary Cardiologist (physician) and Advanced Practice Providers (APPs -  Physician Assistants and Nurse Practitioners) who all work together to provide you with the care you need, when you need it.  We recommend signing up for the patient portal called "MyChart".  Sign up information is provided on this After Visit Summary.  MyChart is used to connect with patients for Virtual Visits (Telemedicine).  Patients are able to view lab/test results, encounter notes, upcoming appointments, etc.  Non-urgent messages can be sent to your provider as well.   To learn more about what you can do with MyChart, go to [email or web address].    Your next appointment:    2 month(s)  The format for your next appointment:   In Person  Provider:   Minus Breeding, MD   Other Instructions Follow up with your pulmonologist, Dr. Vaughan Browner.

## 2020-10-26 DIAGNOSIS — Z23 Encounter for immunization: Secondary | ICD-10-CM | POA: Diagnosis not present

## 2020-11-11 ENCOUNTER — Other Ambulatory Visit: Payer: Self-pay

## 2020-11-11 ENCOUNTER — Ambulatory Visit (HOSPITAL_COMMUNITY): Payer: Medicare Other | Attending: Cardiology

## 2020-11-11 DIAGNOSIS — R0602 Shortness of breath: Secondary | ICD-10-CM | POA: Diagnosis not present

## 2020-11-11 DIAGNOSIS — E785 Hyperlipidemia, unspecified: Secondary | ICD-10-CM | POA: Diagnosis not present

## 2020-11-11 DIAGNOSIS — Z125 Encounter for screening for malignant neoplasm of prostate: Secondary | ICD-10-CM | POA: Diagnosis not present

## 2020-11-11 LAB — ECHOCARDIOGRAM COMPLETE
Area-P 1/2: 3.12 cm2
S' Lateral: 3.1 cm

## 2020-11-18 DIAGNOSIS — I700 Atherosclerosis of aorta: Secondary | ICD-10-CM | POA: Diagnosis not present

## 2020-11-18 DIAGNOSIS — C3411 Malignant neoplasm of upper lobe, right bronchus or lung: Secondary | ICD-10-CM | POA: Diagnosis not present

## 2020-11-18 DIAGNOSIS — Z9861 Coronary angioplasty status: Secondary | ICD-10-CM | POA: Diagnosis not present

## 2020-11-18 DIAGNOSIS — E785 Hyperlipidemia, unspecified: Secondary | ICD-10-CM | POA: Diagnosis not present

## 2020-11-18 DIAGNOSIS — N529 Male erectile dysfunction, unspecified: Secondary | ICD-10-CM | POA: Diagnosis not present

## 2020-11-18 DIAGNOSIS — Z87891 Personal history of nicotine dependence: Secondary | ICD-10-CM | POA: Diagnosis not present

## 2020-11-18 DIAGNOSIS — J439 Emphysema, unspecified: Secondary | ICD-10-CM | POA: Diagnosis not present

## 2020-11-18 DIAGNOSIS — I739 Peripheral vascular disease, unspecified: Secondary | ICD-10-CM | POA: Diagnosis not present

## 2020-11-18 DIAGNOSIS — E669 Obesity, unspecified: Secondary | ICD-10-CM | POA: Diagnosis not present

## 2020-11-18 DIAGNOSIS — K2270 Barrett's esophagus without dysplasia: Secondary | ICD-10-CM | POA: Diagnosis not present

## 2020-11-18 DIAGNOSIS — R82998 Other abnormal findings in urine: Secondary | ICD-10-CM | POA: Diagnosis not present

## 2020-11-18 DIAGNOSIS — Z1212 Encounter for screening for malignant neoplasm of rectum: Secondary | ICD-10-CM | POA: Diagnosis not present

## 2020-11-18 DIAGNOSIS — G4700 Insomnia, unspecified: Secondary | ICD-10-CM | POA: Diagnosis not present

## 2020-11-18 DIAGNOSIS — Z0000 Encounter for general adult medical examination without abnormal findings: Secondary | ICD-10-CM | POA: Diagnosis not present

## 2020-11-20 ENCOUNTER — Other Ambulatory Visit: Payer: Self-pay

## 2020-11-20 ENCOUNTER — Ambulatory Visit (INDEPENDENT_AMBULATORY_CARE_PROVIDER_SITE_OTHER): Payer: Medicare Other | Admitting: Pulmonary Disease

## 2020-11-20 ENCOUNTER — Encounter: Payer: Self-pay | Admitting: Pulmonary Disease

## 2020-11-20 VITALS — BP 122/62 | HR 52 | Temp 97.4°F | Ht 68.5 in | Wt 202.6 lb

## 2020-11-20 DIAGNOSIS — J449 Chronic obstructive pulmonary disease, unspecified: Secondary | ICD-10-CM | POA: Diagnosis not present

## 2020-11-20 DIAGNOSIS — I2510 Atherosclerotic heart disease of native coronary artery without angina pectoris: Secondary | ICD-10-CM

## 2020-11-20 NOTE — Patient Instructions (Signed)
Continue the breztri inhaler.  You need to use 2 puffs twice daily Continue the exercise regimen  Follow-up in 6 months.

## 2020-11-20 NOTE — Progress Notes (Signed)
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 Status post right upper lobectomy with lymph node dissection  Complains of dyspnea on exertion for the past few months.  No symptoms at rest.  Has occasional cough with mucus production.  No fevers, chills Has started on Bevespi couple of months ago with some improvement in symptoms.  Pets: 2 cats.  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  No ongoing exposures with mold, hot tub, Jacuzzi.  No down pillows or comforters Smoking history: 40-pack-year smoker.  Quit in 2012 Travel history: Originally from [place].  No significant recent travel Relevant family history: No significant family history of lung disease  Interim history: Continues to have dyspnea on exertion.  On breztri inhaler which he is using once daily.  Outpatient Encounter Medications as of 11/20/2020  Medication Sig  . aspirin EC 81 MG tablet Take 81 mg by mouth 3 (three) times a week. In the morning  . atorvastatin (LIPITOR) 40 MG tablet TAKE 1 TABLET DAILY  . BREZTRI AEROSPHERE 160-9-4.8 MCG/ACT AERO   . carvedilol (COREG) 12.5 MG tablet Take 12.5 mg by mouth daily.   . Coenzyme Q10 (CO Q-10) 100 MG CAPS Take 100 mg by mouth daily.   Marland Kitchen ezetimibe (ZETIA) 10 MG tablet Take 10 mg by mouth daily.  . fish oil-omega-3 fatty acids 1000 MG capsule Take 1,000 mg by mouth daily.   Marland Kitchen guaiFENesin (MUCINEX) 600 MG 12 hr tablet Take 1 tablet (600 mg total) by mouth 2 (two) times daily as needed for cough or to loosen phlegm.  Marland Kitchen ipratropium (ATROVENT) 0.03 %  nasal spray Place 1 spray into both nostrils daily.   . Multiple Vitamin (MULTIVITAMIN WITH MINERALS) TABS tablet Take 1 tablet by mouth daily.  . nitroGLYCERIN (NITROLINGUAL) 0.4 MG/SPRAY spray USE 1 SPRAY ON OR UNDER THE TONGUE EVERY 5 MINUTES FOR 3 DOSES AS NEEDED FOR CHEST PAIN AS DIRECTED BY PRESCRIBER OR PACKAGE INSTRUCTIONS  . omeprazole (PRILOSEC) 10 MG capsule Take 10 mg by mouth daily.  Marland Kitchen telmisartan-hydrochlorothiazide (MICARDIS HCT) 80-12.5 MG tablet Take 1 tablet by mouth daily.  . [DISCONTINUED] senna (SENOKOT) 176 MG/5ML SYRP Take by mouth.   . [DISCONTINUED] Sennosides (SENNA LAXATIVE) 25 MG TABS Take 1 tablet by mouth daily.  . [DISCONTINUED] acetaminophen (TYLENOL) 500 MG tablet Take 500-1,000 mg by mouth every 6 (six) hours as needed (for pain.).  . [DISCONTINUED] docusate sodium (COLACE) 100 MG capsule Take 100 mg by mouth daily as needed for mild constipation.   No facility-administered encounter medications on file as of 11/20/2020.    Allergies as of 11/20/2020  . (No Known Allergies)    Physical Exam: Blood pressure 122/62, pulse (!) 52, temperature (!) 97.4 F (36.3 C), temperature source Skin, height 5' 8.5" (1.74 m), weight 202 lb 9.6 oz (91.9 kg), SpO2 96 %. Gen:      No acute distress HEENT:  EOMI, sclera anicteric Neck:     No masses; no thyromegaly Lungs:    Clear to auscultation bilaterally; normal respiratory effort CV:  Regular rate and rhythm; no murmurs Abd:      + bowel sounds; soft, non-tender; no palpable masses, no distension Ext:    No edema; adequate peripheral perfusion Skin:      Warm and dry; no rash Neuro: alert and oriented x 3 Psych: normal mood and affect  Data Reviewed: Imaging: CT chest 05/27/2020-s/p right upper lobectomy with loculated fluid, right adrenal adenoma, emphysema.  I have reviewed the images personally.  PFTs: 01/09/2020 FVC 4.11 [97%], FEV1 2.86 [92%], F/F 70, TLC 6.37 [93%], DLCO 18.01 [72%] Mild obstruction,  mild diffusion impairment  07/29/2020 FVC 3.23 [7 6%], FEV1 2.17 [70%], F/F 67, TLC 4.36 [63%], DLCO 15.22 [60%] Moderate obstruction with moderate restriction and diffusion defect  Labs: CBC 05/22/2020-WBC 6.1, eos 1%, absolute eosinophil count 62  Assessment:  COPD Lung cancer status post lobectomy Has worsened lung function on PFTs consistent with changes after lobectomy Continue breztri.  He is using it just once a day.  Have instructed to increase dose to 2 puffs twice daily for maximal effect Continue on pulmonary rehab  Plan/Recommendations: Increase breztri to 2 puffs twice daily Pulmonary rehab  Marshell Garfinkel MD [facility] 11/20/2020, 8:52 AM  CC: Leanna Battles, MD

## 2020-11-21 ENCOUNTER — Encounter: Payer: Self-pay | Admitting: Pulmonary Disease

## 2021-01-11 DIAGNOSIS — R0602 Shortness of breath: Secondary | ICD-10-CM | POA: Insufficient documentation

## 2021-01-11 NOTE — Progress Notes (Signed)
Cardiology Office Note   Date:  01/13/2021   ID:  Daniel, Colon 12-24-1945, MRN [id number]  PCP:  Leanna Battles, MD  Cardiologist:   No primary care provider on file.   Chief Complaint  Patient presents with  . Shortness of Breath      History of Present Illness: Daniel Colon is a 75 y.o. male who presents for follow up of  CAD, s/p stent to RCA in 1996 in [place].  RoutineETT(11/13/13) was abnormal with early ST changes to suggest ischemia. He was set up had LHC (11/17/13): pLAD 30-40, D1 30, mLAD 70-75, mCFX 70, mRCA stent 40 (ISR), EF 55-65%.  He was managed medically.  He returns for follow up.   He had lung right upper lobe resection.  He completed  4 cycles of chemotherapy, Adjuvant systemic chemotherapy with carboplatin for AUC of 6 and paclitaxel 200 mg/M2 every 3 weeks with Neulasta support.   Chemo completed May of last year.   He has had follow up and has appts scheduled with oncology, TCTS and pulmonary.  He exercises routinely.  He is golfing.  He gets a little dyspneic walking up a hill but he actually thinks it is slowly getting better.  He works out at [employer].  He denies any chest pressure, neck or arm discomfort.  He has had no new palpitations, presyncope or syncope.  Echo in November last year was essentially unremarkable.   Past Medical History:  Diagnosis Date  . Allergy   . Arthritis   . Barrett's esophagus   . CAD (coronary artery disease)    a. Stent to the [place] (in [place]);  b. ETT(11/13/13): abnormal with early ST changes to suggest ischemia;   c. LHC (11/17/13):  pLAD 30-40, D1 30, mLAD 70-75, mCFX 70, mRCA stent 40 (ISR), EF 55-65%.  Medical Rx  . Cataract    left eye removed, right immature  . Colon polyps   . Dyslipidemia   . GERD (gastroesophageal reflux disease)   . Hypertension   . Myocardial infarction ([place]) 1996  . Nephrolithiasis    kidney stone  . NSCL Ca dx'd 12/2019  . PUD (peptic ulcer disease)     Past  Surgical History:  Procedure Laterality Date  . ANGIOPLASTY     stent - rt cor. art  . BILROTH II PROCEDURE    . COLONOSCOPY    . INTERCOSTAL NERVE BLOCK Right 01/24/2020   Procedure: Intercostal Nerve Block;  Surgeon: Grace Isaac, MD;  Location: [place];  Service: Thoracic;  Laterality: Right;  . LEFT HEART CATHETERIZATION WITH CORONARY ANGIOGRAM N/A 11/17/2013   Procedure: LEFT HEART CATHETERIZATION WITH CORONARY ANGIOGRAM;  Surgeon: Blane Ohara, MD;  Location[facility] CATH LAB;  Service: Cardiovascular;  Laterality: N/A;  . LYMPH NODE DISSECTION N/A 01/24/2020   Procedure: LYMPH NODE DISSECTION;  Surgeon: Grace Isaac, MD;  Location: [place];  Service: Thoracic;  Laterality: N/A;  . POLYPECTOMY    . ulcer surgery    . ULNAR NERVE TRANSPOSITION Right 08/05/2017   Procedure: RIGHT ULNAR NERVE DECOMPRESSION;  Surgeon: Leanora Cover, MD;  Location: [place];  Service: Orthopedics;  Laterality: Right;  Marland Kitchen VIDEO BRONCHOSCOPY N/A 01/24/2020   Procedure: VIDEO BRONCHOSCOPY;  Surgeon: Grace Isaac, MD;  Location[facility] OR;  Service: Thoracic;  Laterality: N/A;     Current Outpatient Medications  Medication Sig Dispense Refill  . aspirin EC 81 MG tablet Take 81 mg by  mouth 3 (three) times a week. In the morning    . atorvastatin (LIPITOR) 40 MG tablet TAKE 1 TABLET DAILY 90 tablet 3  . BREZTRI AEROSPHERE 160-9-4.8 MCG/ACT AERO Inhale into the lungs 2 (two) times daily.     . carvedilol (COREG) 12.5 MG tablet Take 12.5 mg by mouth daily.     . Coenzyme Q10 (CO Q-10) 100 MG CAPS Take 100 mg by mouth daily.     Marland Kitchen ezetimibe (ZETIA) 10 MG tablet Take 10 mg by mouth daily.    . fish oil-omega-3 fatty acids 1000 MG capsule Take 1,000 mg by mouth daily.     Marland Kitchen ipratropium (ATROVENT) 0.03 % nasal spray Place 1 spray into both nostrils daily.     . Multiple Vitamin (MULTIVITAMIN WITH MINERALS) TABS tablet Take 1 tablet by mouth daily.    Marland Kitchen omeprazole (PRILOSEC) 10 MG capsule Take 10  mg by mouth daily.    Marland Kitchen telmisartan-hydrochlorothiazide (MICARDIS HCT) 80-12.5 MG tablet Take 1 tablet by mouth daily. 90 tablet 3  . guaiFENesin (MUCINEX) 600 MG 12 hr tablet Take 1 tablet (600 mg total) by mouth 2 (two) times daily as needed for cough or to loosen phlegm. (Patient not taking: Reported on 01/13/2021)    . nitroGLYCERIN (NITROLINGUAL) 0.4 MG/SPRAY spray USE 1 SPRAY ON OR UNDER THE TONGUE EVERY 5 MINUTES FOR 3 DOSES AS NEEDED FOR CHEST PAIN AS DIRECTED BY PRESCRIBER OR PACKAGE INSTRUCTIONS 24 g 3   No current facility-administered medications for this visit.    Allergies:   Patient has no known allergies.    ROS:  Please see the history of present illness.   Otherwise, review of systems are positive for none.   All other systems are reviewed and negative.    PHYSICAL EXAM: VS:  BP (!) 100/58 (BP Location: Left Arm, Patient Position: Sitting)   Pulse 61   Ht 5' 8.5" (1.74 m)   Wt 205 lb (93 kg)   SpO2 96%   BMI 30.72 kg/m  , BMI Body mass index is 30.72 kg/m. GENERAL:  Well appearing NECK:  No jugular venous distention, waveform within normal limits, carotid upstroke brisk and symmetric, no bruits, no thyromegaly LUNGS:  Clear to auscultation bilaterally CHEST: Well-healed surgical scar HEART:  PMI not displaced or sustained,S1 and S2 within normal limits, no S3, no S4, no clicks, no rubs, no murmurs ABD:  Flat, positive bowel sounds normal in frequency in pitch, no bruits, no rebound, no guarding, no midline pulsatile mass, no hepatomegaly, no splenomegaly EXT:  2 plus pulses throughout, no edema, no cyanosis no clubbing  EKG:  EKG is  ordered today. The ekg ordered today demonstrates sinus rhythm, rate 61, axis within normal limits, intervals within normal limits, inferolateral ST depression noted on previous EKGs   Recent Labs: 08/28/2020: ALT 19; BUN 28; Creatinine 1.04; Hemoglobin 12.1; Platelet Count 190; Potassium 4.3; Sodium 138    Lipid Panel No results  found for: CHOL, TRIG, HDL, CHOLHDL, VLDL, LDLCALC, LDLDIRECT    Wt Readings from Last 3 Encounters:  01/13/21 205 lb (93 kg)  11/20/20 202 lb 9.6 oz (91.9 kg)  10/17/20 200 lb 6.4 oz (90.9 kg)      Other studies Reviewed: Additional studies/ records that were reviewed today include: Hospital records, labs and PFTs. . Review of the above records demonstrates:  Please see elsewhere in the note.     ASSESSMENT AND PLAN:  CAD:  He has had no chest discomfort.  No  change in therapy is indicated.  He can continue with risk reduction.    DYSLIPIDEMIA: LDL was  was 61 with an HDL of 43 last November 2020.  He says he has had more recent labs and he is going to get those sent to me.  HTN: Blood pressure is actually running low but he says it is doing well at home and no change in therapy.  He has had no symptomatic hypotension.   SOB:    This is slowly improving and likely pulmonary.  No further work-up.   Current medicines are reviewed at length with the patient today.  The patient does not have concerns regarding medicines.  The following changes have been made:  None  Labs/ tests ordered today include:  None  Orders Placed This Encounter  Procedures  . EKG 12-Lead     Disposition:   FU with me in 12 months.     Signed, Minus Breeding, MD  01/13/2021 1:27 PM    [facility]

## 2021-01-13 ENCOUNTER — Encounter: Payer: Self-pay | Admitting: Cardiology

## 2021-01-13 ENCOUNTER — Ambulatory Visit (INDEPENDENT_AMBULATORY_CARE_PROVIDER_SITE_OTHER): Payer: Medicare Other | Admitting: Cardiology

## 2021-01-13 ENCOUNTER — Other Ambulatory Visit: Payer: Self-pay

## 2021-01-13 VITALS — BP 100/58 | HR 61 | Ht 68.5 in | Wt 205.0 lb

## 2021-01-13 DIAGNOSIS — R0602 Shortness of breath: Secondary | ICD-10-CM

## 2021-01-13 DIAGNOSIS — E785 Hyperlipidemia, unspecified: Secondary | ICD-10-CM

## 2021-01-13 DIAGNOSIS — I10 Essential (primary) hypertension: Secondary | ICD-10-CM

## 2021-01-13 DIAGNOSIS — I2510 Atherosclerotic heart disease of native coronary artery without angina pectoris: Secondary | ICD-10-CM | POA: Diagnosis not present

## 2021-01-13 MED ORDER — NITROGLYCERIN 0.4 MG/SPRAY TL SOLN: 3 refills | Status: DC

## 2021-01-13 NOTE — Patient Instructions (Signed)
Medication Instructions:  No changes *If you need a refill on your cardiac medications before your next appointment, please call your pharmacy*   Lab Work: None ordered If you have labs (blood work) drawn today and your tests are completely normal, you will receive your results only by: Marland Kitchen MyChart Message (if you have MyChart) OR . A paper copy in the mail If you have any lab test that is abnormal or we need to change your treatment, we will call you to review the results.   Testing/Procedures: None ordered   Follow-Up: At [facility], you and your health needs are our priority.  As part of our continuing mission to provide you with exceptional heart care, we have created designated Provider Care Teams.  These Care Teams include your primary Cardiologist (physician) and Advanced Practice Providers (APPs -  Physician Assistants and Nurse Practitioners) who all work together to provide you with the care you need, when you need it.  We recommend signing up for the patient portal called "MyChart".  Sign up information is provided on this After Visit Summary.  MyChart is used to connect with patients for Virtual Visits (Telemedicine).  Patients are able to view lab/test results, encounter notes, upcoming appointments, etc.  Non-urgent messages can be sent to your provider as well.   To learn more about what you can do with MyChart, go to [email or web address].    Your next appointment:   12 month(s)  The format for your next appointment:   In Person  Provider:   You may see Dr. Percival Spanish or one of the following Advanced Practice Providers on your designated Care Team:    Rosaria Ferries, PA-C  Jory Sims, DNP, ANP

## 2021-01-15 MED ORDER — NITROGLYCERIN 0.4 MG/SPRAY TL SOLN: 3 refills | Status: DC

## 2021-01-20 ENCOUNTER — Other Ambulatory Visit: Payer: Self-pay

## 2021-01-20 DIAGNOSIS — I739 Peripheral vascular disease, unspecified: Secondary | ICD-10-CM

## 2021-02-12 ENCOUNTER — Ambulatory Visit (HOSPITAL_COMMUNITY)
Admission: RE | Admit: 2021-02-12 | Discharge: 2021-02-12 | Disposition: A | Discharge status: Home / Self Care | Payer: Medicare Other | Source: Ambulatory Visit | Attending: Vascular Surgery | Admitting: Vascular Surgery

## 2021-02-12 ENCOUNTER — Other Ambulatory Visit: Payer: Self-pay

## 2021-02-12 ENCOUNTER — Ambulatory Visit (INDEPENDENT_AMBULATORY_CARE_PROVIDER_SITE_OTHER): Payer: Medicare Other | Admitting: Vascular Surgery

## 2021-02-12 ENCOUNTER — Encounter: Payer: Self-pay | Admitting: Vascular Surgery

## 2021-02-12 VITALS — BP 132/64 | HR 70 | Temp 98.6°F | Resp 20 | Ht 68.5 in | Wt 208.0 lb

## 2021-02-12 DIAGNOSIS — I739 Peripheral vascular disease, unspecified: Secondary | ICD-10-CM | POA: Insufficient documentation

## 2021-02-12 DIAGNOSIS — I2510 Atherosclerotic heart disease of native coronary artery without angina pectoris: Secondary | ICD-10-CM | POA: Diagnosis not present

## 2021-02-12 DIAGNOSIS — R29898 Other symptoms and signs involving the musculoskeletal system: Secondary | ICD-10-CM

## 2021-02-12 NOTE — Progress Notes (Signed)
REASON FOR CONSULT:    To evaluate for peripheral arterial disease.  The consult is requested by Dr. Bevelyn Buckles.  ASSESSMENT & PLAN:   LEG PAIN: This patient has been having some leg pain with ambulation but this also occurs sometimes at rest.  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  Regardless, I reassured him that he has palpable pedal pulses, normal Doppler signals in both feet, and normal ABIs.  He does not have any evidence of significant peripheral vascular disease.  I have encouraged him to stay as active as possible.  We discussed the importance of nutrition.  Fortunately he quit smoking in 2012.  I be happy to see him back at any time if any new vascular issues arise.   Deitra Mayo, MD Office: [phone]   HPI:   Daniel Colon is a pleasant 75 y.o. male, who is referred for evaluation of peripheral vascular disease.  I have reviewed the records from the referring office.  The patient does have a history of hypertension and hypercholesterolemia.  These have been under good control.  He does have a history of coronary artery disease but had no recent symptoms.  I reviewed some labs that were done in November of last year which showed a GFR greater than 60 and a creatinine of 1.0.  On my history I do not get any history of claudication, rest pain, or nonhealing ulcers.  He describes a sensation that his legs are giving out on him when he is ambulating.  This sometimes occurs with standing however also.  It sounds like his activity is somewhat limited by his shortness of breath since his lobectomy.  His risk factors for peripheral vascular disease include hypertension, hypercholesterolemia, a family history of premature cardiovascular disease and a remote history of tobacco use.  He is on aspirin and is on a statin.  Past Medical History:  Diagnosis Date  . Allergy   . Arthritis   . Barrett's esophagus   . CAD (coronary artery  disease)    a. Stent to the [facility] (in [place]);  b. ETT(11/13/13): abnormal with early ST changes to suggest ischemia;   c. LHC (11/17/13):  pLAD 30-40, D1 30, mLAD 70-75, mCFX 70, mRCA stent 40 (ISR), EF 55-65%.  Medical Rx  . Cataract    left eye removed, right immature  . Colon polyps   . Dyslipidemia   . GERD (gastroesophageal reflux disease)   . Hypertension   . Myocardial infarction ([place]) 1996  . Nephrolithiasis    kidney stone  . NSCL Ca dx'd 12/2019  . PUD (peptic ulcer disease)     Family History  Problem Relation Age of Onset  . Sudden death Mother 55  . CAD Brother 22       CABG  . CAD Sister 63       CABG  . CAD Sister 98       CABG  . Cancer Brother   . Liver disease Other   . Colon cancer Neg Hx   . Colon polyps Neg Hx   . Esophageal cancer Neg Hx   . Rectal cancer Neg Hx   . Stomach cancer Neg Hx     SOCIAL HISTORY: Social History   Socioeconomic History  . Marital status: Married    Spouse name: Not on file  . Number of children: 1  . Years of education: Not on file  . Highest education level: Not  on file  Occupational History  . Not on file  Tobacco Use  . Smoking status: Former Smoker    Packs/day: 1.00    Years: 42.00    Pack years: 42.00    Types: Cigarettes    Quit date: 06/21/2011    Years since quitting: 9.6  . Smokeless tobacco: Current User    Types: Chew  Vaping Use  . Vaping Use: Never used  Substance and Sexual Activity  . Alcohol use: Yes    Alcohol/week: 0.0 standard drinks    Comment: social  . Drug use: No  . Sexual activity: Not on file  Other Topics Concern  . Not on file  Social History Narrative   Lives with wife and mother in law.    Social Determinants of Health   Financial Resource Strain: Not on file  Food Insecurity: Not on file  Transportation Needs: Not on file  Physical Activity: Not on file  Stress: Not on file  Social Connections: Not on file  Intimate Partner Violence: Not on file    No Known  Allergies  Current Outpatient Medications  Medication Sig Dispense Refill  . albuterol (VENTOLIN HFA) 108 (90 Base) MCG/ACT inhaler     . aspirin EC 81 MG tablet Take 81 mg by mouth 3 (three) times a week. In the morning    . atorvastatin (LIPITOR) 40 MG tablet TAKE 1 TABLET DAILY 90 tablet 3  . BREZTRI AEROSPHERE 160-9-4.8 MCG/ACT AERO Inhale into the lungs 2 (two) times daily.     . carvedilol (COREG) 12.5 MG tablet Take 12.5 mg by mouth daily.     . Coenzyme Q10 (CO Q-10) 100 MG CAPS Take 100 mg by mouth daily.     Marland Kitchen ezetimibe (ZETIA) 10 MG tablet Take 10 mg by mouth daily.    . fish oil-omega-3 fatty acids 1000 MG capsule Take 1,000 mg by mouth daily.     Marland Kitchen ipratropium (ATROVENT) 0.03 % nasal spray Place 1 spray into both nostrils daily.     . Multiple Vitamin (MULTIVITAMIN WITH MINERALS) TABS tablet Take 1 tablet by mouth daily.    . nitroGLYCERIN (NITROLINGUAL) 0.4 MG/SPRAY spray USE 1 SPRAY ON OR UNDER THE TONGUE EVERY 5 MINUTES FOR 3 DOSES AS NEEDED FOR CHEST PAIN AS DIRECTED BY PRESCRIBER OR PACKAGE INSTRUCTIONS 24 g 3  . omeprazole (PRILOSEC) 10 MG capsule Take 10 mg by mouth daily.    Marland Kitchen telmisartan-hydrochlorothiazide (MICARDIS HCT) 80-12.5 MG tablet Take 1 tablet by mouth daily. 90 tablet 3  . guaiFENesin (MUCINEX) 600 MG 12 hr tablet Take 1 tablet (600 mg total) by mouth 2 (two) times daily as needed for cough or to loosen phlegm. (Patient not taking: No sig reported)     No current facility-administered medications for this visit.    REVIEW OF SYSTEMS:  [X]  denotes positive finding, [ ]  denotes negative finding Cardiac  Comments:  Chest pain or chest pressure:    Shortness of breath upon exertion: x   Short of breath when lying flat:    Irregular heart rhythm:        Vascular    Pain in calf, thigh, or hip brought on by ambulation:    Pain in feet at night that wakes you up from your sleep:     Blood clot in your veins:    Leg swelling:         Pulmonary    Oxygen  at home:    Productive cough:  Wheezing:  x       Neurologic    Sudden weakness in arms or legs:  x   Sudden numbness in arms or legs:     Sudden onset of difficulty speaking or slurred speech:    Temporary loss of vision in one eye:     Problems with dizziness:         Gastrointestinal    Blood in stool:     Vomited blood:         Genitourinary    Burning when urinating:     Blood in urine:        Psychiatric    Major depression:         Hematologic    Bleeding problems:    Problems with blood clotting too easily:        Skin    Rashes or ulcers:        Constitutional    Fever or chills:     PHYSICAL EXAM:   Vitals:   02/12/21 1323  BP: 132/64  Pulse: 70  Resp: 20  Temp: 98.6 F (37 C)  SpO2: 94%  Weight: 94.3 kg  Height: 5' 8.5" (1.74 m)    GENERAL: The patient is a well-nourished male, in no acute distress. The vital signs are documented above. CARDIAC: There is a regular rate and rhythm.  VASCULAR: I do not detect carotid bruits. He has palpable femoral, popliteal, dorsalis pedis, and posterior tibial pulses bilaterally. He has no significant lower extremity swelling. PULMONARY: There is good air exchange bilaterally without wheezing or rales. ABDOMEN: Soft and non-tender with normal pitched bowel sounds.  I do not palpate an aneurysm. MUSCULOSKELETAL: There are no major deformities or cyanosis. NEUROLOGIC: No focal weakness or paresthesias are detected. SKIN: There are no ulcers or rashes noted. PSYCHIATRIC: The patient has a normal affect.  DATA:    ARTERIAL DOPPLER STUDY: I have independently interpreted his arterial Doppler study today.  On the right side he has a triphasic dorsalis pedis and posterior tibial signal.  ABI is 100%.  Toe pressure is 97 mmHg.  On the left side he has a triphasic dorsalis pedis and posterior tibial signal.  ABI is 99%.  Toe pressures 112 mmHg.  LABS: I reviewed his labs from November 2021.  His GFR was greater  than 60 creatinine was 1.  Hemoglobin was 12.6 with a platelet count of 204,000.  His LDL cholesterol was 47.  Total cholesterol 106.  Triglycerides 75.

## 2021-02-28 ENCOUNTER — Inpatient Hospital Stay: Payer: Medicare Other | Attending: Internal Medicine

## 2021-02-28 ENCOUNTER — Other Ambulatory Visit: Payer: Self-pay

## 2021-02-28 ENCOUNTER — Ambulatory Visit (HOSPITAL_COMMUNITY)
Admission: RE | Admit: 2021-02-28 | Discharge: 2021-02-28 | Disposition: A | Discharge status: Home / Self Care | Payer: Medicare Other | Source: Ambulatory Visit | Attending: Internal Medicine | Admitting: Internal Medicine

## 2021-02-28 DIAGNOSIS — C3490 Malignant neoplasm of unspecified part of unspecified bronchus or lung: Secondary | ICD-10-CM | POA: Insufficient documentation

## 2021-02-28 DIAGNOSIS — Z79899 Other long term (current) drug therapy: Secondary | ICD-10-CM | POA: Insufficient documentation

## 2021-02-28 DIAGNOSIS — I700 Atherosclerosis of aorta: Secondary | ICD-10-CM | POA: Diagnosis not present

## 2021-02-28 DIAGNOSIS — Z9221 Personal history of antineoplastic chemotherapy: Secondary | ICD-10-CM | POA: Insufficient documentation

## 2021-02-28 DIAGNOSIS — Z955 Presence of coronary angioplasty implant and graft: Secondary | ICD-10-CM | POA: Diagnosis not present

## 2021-02-28 DIAGNOSIS — Z85118 Personal history of other malignant neoplasm of bronchus and lung: Secondary | ICD-10-CM | POA: Insufficient documentation

## 2021-02-28 DIAGNOSIS — I252 Old myocardial infarction: Secondary | ICD-10-CM | POA: Insufficient documentation

## 2021-02-28 DIAGNOSIS — I2510 Atherosclerotic heart disease of native coronary artery without angina pectoris: Secondary | ICD-10-CM | POA: Diagnosis not present

## 2021-02-28 DIAGNOSIS — I10 Essential (primary) hypertension: Secondary | ICD-10-CM | POA: Insufficient documentation

## 2021-02-28 DIAGNOSIS — Z902 Acquired absence of lung [part of]: Secondary | ICD-10-CM | POA: Insufficient documentation

## 2021-02-28 DIAGNOSIS — Z7982 Long term (current) use of aspirin: Secondary | ICD-10-CM | POA: Insufficient documentation

## 2021-02-28 LAB — CMP (CANCER CENTER ONLY)
ALT: 21 U/L (ref 0–44)
AST: 17 U/L (ref 15–41)
Albumin: 4.1 g/dL (ref 3.5–5.0)
Alkaline Phosphatase: 70 U/L (ref 38–126)
Anion gap: 9 (ref 5–15)
BUN: 24 mg/dL — ABNORMAL HIGH (ref 8–23)
CO2: 24 mmol/L (ref 22–32)
Calcium: 8.9 mg/dL (ref 8.9–10.3)
Chloride: 107 mmol/L (ref 98–111)
Creatinine: 0.94 mg/dL (ref 0.61–1.24)
GFR, Estimated: >60 mL/min (ref >60)
Glucose, Bld: 104 mg/dL — ABNORMAL HIGH (ref 70–99)
Potassium: 4.3 mmol/L (ref 3.5–5.1)
Sodium: 140 mmol/L (ref 135–145)
Total Bilirubin: 0.8 mg/dL (ref 0.3–1.2)
Total Protein: 6.6 g/dL (ref 6.5–8.1)

## 2021-02-28 LAB — CBC WITH DIFFERENTIAL (CANCER CENTER ONLY)
Abs Immature Granulocytes: 0.02 10*3/uL (ref 0.00–0.07)
Basophils Absolute: 0 10*3/uL (ref 0.0–0.1)
Basophils Relative: 1 %
Eosinophils Absolute: 0.1 10*3/uL (ref 0.0–0.5)
Eosinophils Relative: 1 %
HCT: 39.3 % (ref 39.0–52.0)
Hemoglobin: 13 g/dL (ref 13.0–17.0)
Immature Granulocytes: 0 %
Lymphocytes Relative: 23 %
Lymphs Abs: 1.5 10*3/uL (ref 0.7–4.0)
MCH: 27.1 pg (ref 26.0–34.0)
MCHC: 33.1 g/dL (ref 30.0–36.0)
MCV: 81.9 fL (ref 80.0–100.0)
Monocytes Absolute: 0.6 10*3/uL (ref 0.1–1.0)
Monocytes Relative: 9 %
Neutro Abs: 4.1 10*3/uL (ref 1.7–7.7)
Neutrophils Relative %: 66 %
Platelet Count: 187 10*3/uL (ref 150–400)
RBC: 4.8 MIL/uL (ref 4.22–5.81)
RDW: 14.7 % (ref 11.5–15.5)
WBC Count: 6.2 10*3/uL (ref 4.0–10.5)
nRBC: 0 % (ref 0.0–0.2)

## 2021-02-28 MED ORDER — IOHEXOL 300 MG/ML  SOLN
75.0000 mL | Freq: Once | INTRAMUSCULAR | Status: AC | PRN
  Administered 2021-02-28: 75 mL via INTRAVENOUS

## 2021-03-03 ENCOUNTER — Telehealth: Payer: Self-pay | Admitting: Internal Medicine

## 2021-03-03 ENCOUNTER — Encounter: Payer: Self-pay | Admitting: Internal Medicine

## 2021-03-03 ENCOUNTER — Inpatient Hospital Stay (HOSPITAL_BASED_OUTPATIENT_CLINIC_OR_DEPARTMENT_OTHER): Payer: Medicare Other | Admitting: Internal Medicine

## 2021-03-03 ENCOUNTER — Other Ambulatory Visit: Payer: Self-pay

## 2021-03-03 VITALS — BP 124/62 | HR 56 | Temp 98.3°F | Resp 20 | Ht 68.5 in | Wt 206.5 lb

## 2021-03-03 DIAGNOSIS — I10 Essential (primary) hypertension: Secondary | ICD-10-CM | POA: Diagnosis not present

## 2021-03-03 DIAGNOSIS — C3411 Malignant neoplasm of upper lobe, right bronchus or lung: Secondary | ICD-10-CM | POA: Diagnosis not present

## 2021-03-03 DIAGNOSIS — I252 Old myocardial infarction: Secondary | ICD-10-CM | POA: Diagnosis not present

## 2021-03-03 DIAGNOSIS — Z79899 Other long term (current) drug therapy: Secondary | ICD-10-CM | POA: Diagnosis not present

## 2021-03-03 DIAGNOSIS — Z902 Acquired absence of lung [part of]: Secondary | ICD-10-CM | POA: Diagnosis not present

## 2021-03-03 DIAGNOSIS — Z7982 Long term (current) use of aspirin: Secondary | ICD-10-CM | POA: Diagnosis not present

## 2021-03-03 DIAGNOSIS — Z9221 Personal history of antineoplastic chemotherapy: Secondary | ICD-10-CM | POA: Diagnosis not present

## 2021-03-03 DIAGNOSIS — I2510 Atherosclerotic heart disease of native coronary artery without angina pectoris: Secondary | ICD-10-CM | POA: Diagnosis not present

## 2021-03-03 DIAGNOSIS — C3490 Malignant neoplasm of unspecified part of unspecified bronchus or lung: Secondary | ICD-10-CM

## 2021-03-03 DIAGNOSIS — Z85118 Personal history of other malignant neoplasm of bronchus and lung: Secondary | ICD-10-CM | POA: Diagnosis not present

## 2021-03-03 NOTE — Telephone Encounter (Signed)
Scheduled per los. Gave avs and calendar  

## 2021-03-03 NOTE — Progress Notes (Signed)
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 Servando Snare. 2) Adjuvant systemic chemotherapy with carboplatin for AUC of 6 and paclitaxel 200 mg/M2 every 3 weeks with Neulasta support.  First dose February 28, 2020.  Status post 4 cycles.  Last dose was given May 01, 2020.  CURRENT THERAPY: Observation.  INTERVAL HISTORY: Daniel Colon 75 y.o. male returns to the clinic today for 6 months follow-up visit accompanied by his wife.  The patient is feeling fine today with no concerning complaints except for occasional pain on the right side of the chest from the surgical scar.  He denied having any current shortness of breath except with exertion with no cough or hemoptysis.  He has no nausea, vomiting, diarrhea or constipation.  He denied having any headache or visual changes.  He has no recent weight loss or night sweats.  The patient had repeat CT scan of the chest performed recently and he is here for evaluation and discussion of his discuss results.    MEDICAL HISTORY: Past Medical History:  Diagnosis Date  . Allergy   . Arthritis   . Barrett's esophagus   . CAD (coronary artery disease)    a. Stent to the [facility] (in [place]);  b. ETT(11/13/13): abnormal with early ST changes to suggest ischemia;   c. LHC (11/17/13):  pLAD 30-40, D1 30, mLAD 70-75, mCFX 70, mRCA stent 40 (ISR), EF 55-65%.  Medical Rx  . Cataract    left eye removed, right immature  . Colon polyps   . Dyslipidemia   . GERD (gastroesophageal reflux disease)   . Hypertension   . Myocardial infarction ([place]) 1996  . Nephrolithiasis    kidney stone  . NSCL Ca dx'd 12/2019  . PUD (peptic ulcer  disease)     ALLERGIES:  has No Known Allergies.  MEDICATIONS:  Current Outpatient Medications  Medication Sig Dispense Refill  . albuterol (VENTOLIN HFA) 108 (90 Base) MCG/ACT inhaler     . aspirin EC 81 MG tablet Take 81 mg by mouth 3 (three) times a week. In the morning    . atorvastatin (LIPITOR) 40 MG tablet TAKE 1 TABLET DAILY 90 tablet 3  . BREZTRI AEROSPHERE 160-9-4.8 MCG/ACT AERO Inhale into the lungs 2 (two) times daily.     . carvedilol (COREG) 12.5 MG tablet Take 12.5 mg by mouth daily.     . Coenzyme Q10 (CO Q-10) 100 MG CAPS Take 100 mg by mouth daily.     Marland Kitchen ezetimibe (ZETIA) 10 MG tablet Take 10 mg by mouth daily.    . fish oil-omega-3 fatty acids 1000 MG capsule Take 1,000 mg by mouth daily.     Marland Kitchen guaiFENesin (MUCINEX) 600 MG 12 hr tablet Take 1 tablet (600 mg total) by mouth 2 (two) times daily as needed for cough or to loosen phlegm. (Patient not taking: No sig reported)    . ipratropium (ATROVENT) 0.03 % nasal spray Place 1 spray into both nostrils daily.     . Multiple Vitamin (MULTIVITAMIN WITH MINERALS) TABS tablet Take 1 tablet by mouth daily.    . nitroGLYCERIN (NITROLINGUAL) 0.4 MG/SPRAY spray USE 1 SPRAY ON OR  UNDER THE TONGUE EVERY 5 MINUTES FOR 3 DOSES AS NEEDED FOR CHEST PAIN AS DIRECTED BY PRESCRIBER OR PACKAGE INSTRUCTIONS 24 g 3  . omeprazole (PRILOSEC) 10 MG capsule Take 10 mg by mouth daily.    Marland Kitchen telmisartan-hydrochlorothiazide (MICARDIS HCT) 80-12.5 MG tablet Take 1 tablet by mouth daily. 90 tablet 3   No current facility-administered medications for this visit.    SURGICAL HISTORY:  Past Surgical History:  Procedure Laterality Date  . ANGIOPLASTY     stent - rt cor. art  . BILROTH II PROCEDURE    . COLONOSCOPY    . INTERCOSTAL NERVE BLOCK Right 01/24/2020   Procedure: Intercostal Nerve Block;  Surgeon: Grace Isaac, MD;  Location: [facility];  Service: Thoracic;  Laterality: Right;  . LEFT HEART CATHETERIZATION WITH CORONARY ANGIOGRAM N/A  11/17/2013   Procedure: LEFT HEART CATHETERIZATION WITH CORONARY ANGIOGRAM;  Surgeon: Blane Ohara, MD;  Location: [facility] CATH LAB;  Service: Cardiovascular;  Laterality: N/A;  . LYMPH NODE DISSECTION N/A 01/24/2020   Procedure: LYMPH NODE DISSECTION;  Surgeon: Grace Isaac, MD;  Location: [facility];  Service: Thoracic;  Laterality: N/A;  . POLYPECTOMY    . ulcer surgery    . ULNAR NERVE TRANSPOSITION Right 08/05/2017   Procedure: RIGHT ULNAR NERVE DECOMPRESSION;  Surgeon: Leanora Cover, MD;  Location: [facility];  Service: Orthopedics;  Laterality: Right;  Marland Kitchen VIDEO BRONCHOSCOPY N/A 01/24/2020   Procedure: VIDEO BRONCHOSCOPY;  Surgeon: Grace Isaac, MD;  Location: [facility] OR;  Service: Thoracic;  Laterality: N/A;    REVIEW OF SYSTEMS:  A comprehensive review of systems was negative except for: Respiratory: positive for dyspnea on exertion and pleurisy/chest pain   PHYSICAL EXAMINATION: General appearance: alert, cooperative, fatigued and no distress Head: Normocephalic, without obvious abnormality, atraumatic Neck: no adenopathy, no JVD, supple, symmetrical, trachea midline and thyroid not enlarged, symmetric, no tenderness/mass/nodules Lymph nodes: Cervical, supraclavicular, and axillary nodes normal. Resp: clear to auscultation bilaterally Back: symmetric, no curvature. ROM normal. No CVA tenderness. Cardio: regular rate and rhythm, S1, S2 normal, no murmur, click, rub or gallop GI: soft, non-tender; bowel sounds normal; no masses,  no organomegaly Extremities: extremities normal, atraumatic, no cyanosis or edema  ECOG PERFORMANCE STATUS: 1 - Symptomatic but completely ambulatory  Blood pressure 124/62, pulse (!) 56, temperature 98.3 F (36.8 C), temperature source Tympanic, resp. rate 20, height 5' 8.5" (1.74 m), weight 206 lb 8 oz (93.7 kg), SpO2 98 %.  LABORATORY DATA: Lab Results  Component Value Date   WBC 6.2 02/28/2021   HGB 13.0 02/28/2021   HCT 39.3 02/28/2021    MCV 81.9 02/28/2021   PLT 187 02/28/2021      Chemistry      Component Value Date/Time   NA 140 02/28/2021 0905   K 4.3 02/28/2021 0905   CL 107 02/28/2021 0905   CO2 24 02/28/2021 0905   BUN 24 (H) 02/28/2021 0905   CREATININE 0.94 02/28/2021 0905      Component Value Date/Time   CALCIUM 8.9 02/28/2021 0905   ALKPHOS 70 02/28/2021 0905   AST 17 02/28/2021 0905   ALT 21 02/28/2021 0905   BILITOT 0.8 02/28/2021 0905       RADIOGRAPHIC STUDIES: CT Chest W Contrast  Result Date: 02/28/2021 CLINICAL DATA:  Primary Cancer Type: Lung Imaging Indication: Routine surveillance Interval therapy since last imaging? 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 Primary  Tumor location:  Right upper lobe. Surgeries: Right upper lobectomy 01/24/2020. Coronary stent. Chemotherapy: Yes; Ongoing? No; Most recent administration: 05/01/2020 Immunotherapy? No Radiation therapy? No EXAM: CT CHEST WITH CONTRAST TECHNIQUE: Multidetector CT imaging of the chest was performed during intravenous contrast administration. CONTRAST:  57mL OMNIPAQUE IOHEXOL 300 MG/ML  SOLN COMPARISON:  Most recent CT chest 08/28/2020.  01/05/2020 PET-CT. FINDINGS: Cardiovascular: The heart size is normal. No substantial pericardial effusion. Coronary artery calcification is evident. Atherosclerotic calcification is noted in the wall of the thoracic aorta. Mediastinum/Nodes: No mediastinal lymphadenopathy. There is no hilar lymphadenopathy. The esophagus has normal imaging features. There is no axillary lymphadenopathy. Lungs/Pleura: Right upper lobectomy. The apical collection of fluid and gas seen previously has decreased in size in the interval although the amount of gas in the pleural space has increased. No discrete bronchopleural fistula evident. No new suspicious nodule or mass. Upper Abdomen: Tiny hypodensity in  the medial segment left liver is stable, likely benign. Similar appearance 1.7 cm right adrenal nodule. 1.6 cm left adrenal nodule is stable. Musculoskeletal: No worrisome lytic or sclerotic osseous abnormality. IMPRESSION: 1. Postsurgical changes in the right hemithorax consistent with right upper lobectomy. Small loculated right apical collection of pleural fluid and gas has decreased in size in the interval although the gas volume has increased. No discrete bronchopleural fistula visible on CT imaging today. 2. No new or progressive findings to raise concern for recurrent or metastatic disease. 3. Stable tiny bilateral adrenal nodules. Continued attention on follow-up recommended. Electronically Signed   By: Misty Stanley M.D.   On: 02/28/2021 11:24   VAS [place] ABI WITH/WO TBI  Result Date: 02/12/2021 LOWER EXTREMITY DOPPLER STUDY Indications: Claudication. High Risk Factors: Hypertension, hyperlipidemia, coronary artery disease.  Comparison Study: None Performing Technologist: Ivan Croft  Examination Guidelines: A complete evaluation includes at minimum, Doppler waveform signals and systolic blood pressure reading at the level of bilateral brachial, anterior tibial, and posterior tibial arteries, when vessel segments are accessible. Bilateral testing is considered an integral part of a complete examination. Photoelectric Plethysmograph (PPG) waveforms and toe systolic pressure readings are included as required and additional duplex testing as needed. Limited examinations for reoccurring indications may be performed as noted.  ABI Findings: +---------+------------------+-----+---------+--------+ Right    Rt Pressure (mmHg)IndexWaveform Comment  +---------+------------------+-----+---------+--------+ Brachial 149                                      +---------+------------------+-----+---------+--------+ PTA      175               1.17 triphasic          +---------+------------------+-----+---------+--------+ DP       157               1.05 triphasic         +---------+------------------+-----+---------+--------+ Great Toe97                0.65                   +---------+------------------+-----+---------+--------+ +---------+------------------+-----+---------+-------+ Left     Lt Pressure (mmHg)IndexWaveform Comment +---------+------------------+-----+---------+-------+ Brachial 149                                     +---------+------------------+-----+---------+-------+ PTA      146  0.98 triphasic        +---------+------------------+-----+---------+-------+ DP       147               0.99 triphasic        +---------+------------------+-----+---------+-------+ Great Toe112               0.75                  +---------+------------------+-----+---------+-------+ +-------+-----------+-----------+------------+------------+ ABI/TBIToday's ABIToday's TBIPrevious ABIPrevious TBI +-------+-----------+-----------+------------+------------+ Right  1.17       0.65                                +-------+-----------+-----------+------------+------------+ Left   0.99       0.75                                +-------+-----------+-----------+------------+------------+   Summary: Right: Resting right ankle-brachial index is within normal range. No evidence of significant right lower extremity arterial disease. The right toe-brachial index is abnormal. Left: Resting left ankle-brachial index is within normal range. No evidence of significant left lower extremity arterial disease. The left toe-brachial index is normal.  *See table(s) above for measurements and observations.  Electronically signed by Deitra Mayo MD on 02/12/2021 at 1:24:05 PM.    Final     ASSESSMENT AND PLAN: This is a very pleasant 75 years old white male recently diagnosed with a stage IIb non-small cell lung cancer with  sarcomatoid features in February 2021 status post right upper lobectomy with lymph node dissection under the care of Dr. Servando Snare. The patient is also status post 4 cycles of adjuvant systemic chemotherapy with carboplatin and paclitaxel completed in May 2021. 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 He had repeat CT scan of the chest performed recently.  I personally and independently reviewed the scan and discussed the results with the patient and his wife today. His scan showed no concerning findings for disease recurrence or metastasis. I recommended for the patient to continue on observation with repeat CT scan of the chest in 6 months. The patient was advised to call immediately if he has any concerning symptoms in the interval. The patient voices understanding of current disease status and treatment options and is in agreement with the current care plan.  All questions were answered. The patient knows to call the clinic with any problems, questions or concerns. We can certainly see the patient much sooner if necessary.  Disclaimer: This note was dictated with voice recognition software. Similar sounding words can inadvertently be transcribed and may not be corrected upon review.

## 2021-03-13 ENCOUNTER — Ambulatory Visit (INDEPENDENT_AMBULATORY_CARE_PROVIDER_SITE_OTHER): Payer: Medicare Other | Admitting: Cardiothoracic Surgery

## 2021-03-13 ENCOUNTER — Encounter: Payer: Self-pay | Admitting: Cardiothoracic Surgery

## 2021-03-13 ENCOUNTER — Other Ambulatory Visit: Payer: Self-pay

## 2021-03-13 VITALS — BP 121/61 | HR 77 | Temp 98.1°F | Resp 20 | Wt 206.0 lb

## 2021-03-13 DIAGNOSIS — C3411 Malignant neoplasm of upper lobe, right bronchus or lung: Secondary | ICD-10-CM

## 2021-03-13 DIAGNOSIS — Z902 Acquired absence of lung [part of]: Secondary | ICD-10-CM

## 2021-03-13 DIAGNOSIS — I2510 Atherosclerotic heart disease of native coronary artery without angina pectoris: Secondary | ICD-10-CM | POA: Diagnosis not present

## 2021-03-13 NOTE — Progress Notes (Signed)
[address]       [address] Colon,[place] [address]             [phone]                  [name] [name] Date of Birth: 07-16-1946  Referring GE:[id number], Lars Masson, NP Primary Cardiology: Primary Care:Paterson, Quillian Quince, MD  Chief Complaint:  Follow Up Visit OPERATIVE REPORT DATE OF PROCEDURE:  01/24/2020 PREOPERATIVE DIAGNOSIS:  A 2 cm right upper lobe lung mass found on surveillance lung cancer screening CT. POSTOPERATIVE DIAGNOSES:   1.  Malignant neoplasm, right upper lobe, 2.  Sarcomatous changes on frozen section. PROCEDURES PERFORMED:   1.  Bronchoscopy.   2.  Robotic-assisted wedge resection- right upper lobe with completion right upper lobe lobectomy, lymph node dissection, and intercostal nerve block. SURGEON:  Lanelle Bal, MD  Cancer Staging Lung cancer [facility]) Staging form: Lung, AJCC 8th Edition - Pathologic stage from 01/29/2020: Stage IIB (pT1c, pN1, cM0) - Signed by Grace Isaac, MD on 01/29/2020  SURGICAL PATHOLOGY  CASE: [id number]  PATIENT: Daniel Colon  Surgical Pathology Report   Clinical History: pulmonary nodule, subpleural nodule in right upper  lobe (cm)   FINAL MICROSCOPIC DIAGNOSIS:   A. LUNG, RIGHT UPPER LOBE, WEDGE RESECTION:  - Poorly differentiated carcinoma with sarcomatoid features, 2.2 cm.  - See oncology table and comment.   B. LYMPH NODE, LEVEL 7, EXCISION:  - Anthracotic lymph node with no metastatic carcinoma.   C. LYMPH NODE, 10R, EXCISION:  - Anthracotic lymph node with no metastatic carcinoma.   D. LYMPH NODE, 10R #2, EXCISION:  - Anthracotic lymph node with no metastatic carcinoma.   E. LYMPH NODE, 12R, EXCISION:  - Anthracotic lymph node with no metastatic carcinoma.   F. LYMPH NODE, 10R #3, EXCISION:  - Anthracotic lymph node with no metastatic carcinoma.   G. LYMPH NODE, 10R #4, EXCISION:  - Anthracotic lymph node with no metastatic carcinoma.   H. LYMPH  NODE, 11R, EXCISION:  - Anthracotic lymph node with no metastatic carcinoma.   I. LYMPH NODE, 11R #2, EXCISION:  - Anthracotic lymph node with no metastatic carcinoma.   J. LYMPH NODE, 11R #3, EXCISION:  - Anthracotic lymph node with no metastatic carcinoma.   K. LYMPH NODE, 11R #4, EXCISION:  - Anthracotic lymph node with no metastatic carcinoma.   L. LUNG, RIGHT UPPER LOBE COMPLETION, LOBECTOMY:  - Findings consistent with previous wedge biopsy.  - Final margins free of tumor.  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   ONCOLOGY TABLE:  LUNG: Resection  Procedure: Right upper lobe wedge biopsy, right upper lobectomy and  lymph node biopsies.  Specimen Laterality: Right  Tumor Site: Right upper lobe.  Tumor Size: 2.2 x 1.5 x 0.8 cm.  Tumor Focality: Unifocal.  Histologic Type: Poorly differentiated carcinoma with f features  (sarcomatoid carcinoma).  Visceral Pleura Invasion: Involves subpleural connective tissue but does  not extend to pleural surface.  Lymphovascular Invasion: Present.  Direct Invasion of Adjacent Structures: No adjacent structures  submitted.  Margins: Free of tumor.  Treatment Effect: No known presurgical therapy.  Regional Lymph Nodes:    Number of Lymph Nodes Involved: 1    Number of Lymph Nodes Examined: 12  Pathologic Stage Classification (pTNM, AJCC 8th Edition): pT1c, pN1  Ancillary Studies: Can be performed if requested.  Representative Tumor Block: A2, A3 and  A4.  Comment(s): The tumor is poorly differentiated with large areas of  diffuse sheets of malignant cells some of which have spindle cell  features with marked nuclear pleomorphism and frequent mitotic figures.  There are areas with epithelioid features with abundant eosinophilic  cytoplasm. Immunohistochemistry shows positivity with cytokeratin  AE1/AE3, cytokeratin 7 and TTF-1. The tumor is negative with  cytokeratin 5/6, p40, p63,  Napsin-A, MOC31, cytokeratin 20, and CDX-2.  The immunophenotype is consistent with poorly differentiated carcinoma  and the morphology shows sarcomatoid features (sarcomatoid carcinoma).   Dr. Tresa Moore has reviewed slides of the tumor and agrees.  ([id number])   History of Present Illness:     Patient returns to the office today with a follow-up CT of the chest after  robotic right upper lobectomy for a poorly differentiated 2 cm right upper lobe mass with single positive hilar lymph node.  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   He has noted paresthesias around the right lower chest and extending to the right upper abdomen.  This has improved some but still present.  He notes it does make it slightly harder for him to play golf.    With the positive hilar lymph node he was referred to medical oncology-He completed  4 cycles of chemotherapy, Adjuvant systemic chemotherapy with carboplatin for AUC of 6 and paclitaxel 200 mg/M2 every 3 weeks with Neulasta support. First dose February 28, 2020.      Zubrod Score: At the time of surgery this patient's most appropriate activity status/level should be described as: []     0    Normal activity, no symptoms [x]     1    Restricted in physical strenuous activity but ambulatory, able to do out light work []     2    Ambulatory and capable of self care, unable to do work activities, up and about                 >50 % of waking hours                                                                                   []     3    Only limited self care, in bed greater than 50% of waking hours []     4    Completely disabled, no self care, confined to bed or chair []     5    Moribund  Social History   Tobacco Use  Smoking Status Former Smoker  . Packs/day: 1.00  . Years: 42.00  . Pack years: 42.00  . Types: Cigarettes  . Quit date: 06/21/2011  . Years since  quitting: 9.7  Smokeless Tobacco Current User  . Types: Chew       No Known Allergies  Current Outpatient Medications  Medication Sig Dispense Refill  . albuterol (VENTOLIN HFA) 108 (90 Base) MCG/ACT inhaler     . aspirin EC 81 MG tablet Take 81 mg by mouth 3 (three) times a week. In the morning    . atorvastatin (LIPITOR) 40 MG tablet TAKE 1 TABLET DAILY  90 tablet 3  . BREZTRI AEROSPHERE 160-9-4.8 MCG/ACT AERO Inhale into the lungs 2 (two) times daily.     . carvedilol (COREG) 12.5 MG tablet Take 12.5 mg by mouth daily.     . Coenzyme Q10 (CO Q-10) 100 MG CAPS Take 100 mg by mouth daily.     Marland Kitchen ezetimibe (ZETIA) 10 MG tablet Take 10 mg by mouth daily.    . fish oil-omega-3 fatty acids 1000 MG capsule Take 1,000 mg by mouth daily.     Marland Kitchen guaiFENesin (MUCINEX) 600 MG 12 hr tablet Take 1 tablet (600 mg total) by mouth 2 (two) times daily as needed for cough or to loosen phlegm.    Marland Kitchen ipratropium (ATROVENT) 0.03 % nasal spray Place 1 spray into both nostrils daily.     . Multiple Vitamin (MULTIVITAMIN WITH MINERALS) TABS tablet Take 1 tablet by mouth daily.    . nitroGLYCERIN (NITROLINGUAL) 0.4 MG/SPRAY spray USE 1 SPRAY ON OR UNDER THE TONGUE EVERY 5 MINUTES FOR 3 DOSES AS NEEDED FOR CHEST PAIN AS DIRECTED BY PRESCRIBER OR PACKAGE INSTRUCTIONS 24 g 3  . omeprazole (PRILOSEC) 10 MG capsule Take 10 mg by mouth daily.    Marland Kitchen telmisartan-hydrochlorothiazide (MICARDIS HCT) 80-12.5 MG tablet Take 1 tablet by mouth daily. 90 tablet 3   No current facility-administered medications for this visit.       Physical Exam: BP 121/61 (BP Location: Left Arm, Patient Position: Sitting, Cuff Size: Normal)   Pulse 77   Temp 98.1 F (36.7 C) (Skin)   Resp 20   Wt 206 lb (93.4 kg)   SpO2 92% Comment: RA  BMI 30.87 kg/m  General appearance: alert, cooperative and no distress Head: Normocephalic, without obvious abnormality, atraumatic Neck: no adenopathy, no carotid bruit, no JVD, supple, symmetrical,  trachea midline and thyroid not enlarged, symmetric, no tenderness/mass/nodules Lymph nodes: Cervical, supraclavicular, and axillary nodes normal. Resp: clear to auscultation bilaterally Cardio: regular rate and rhythm, S1, S2 normal, no murmur, click, rub or gallop GI: soft, non-tender; bowel sounds normal; no masses,  no organomegaly Extremities: extremities normal, atraumatic, no cyanosis or edema Neurologic: Grossly normal    Diagnostic Studies & Laboratory data:         Recent Radiology Findings:  CT Chest W Contrast  Result Date: 02/28/2021 CLINICAL DATA:  Primary Cancer Type: Lung Imaging Indication: Routine surveillance Interval therapy since last imaging? 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 Primary Tumor location:  Right upper lobe. Surgeries: Right upper lobectomy 01/24/2020. Coronary stent. Chemotherapy: Yes; Ongoing? No; Most recent administration: 05/01/2020 Immunotherapy? No Radiation therapy? No EXAM: CT CHEST WITH CONTRAST TECHNIQUE: Multidetector CT imaging of the chest was performed during intravenous contrast administration. CONTRAST:  32mL OMNIPAQUE IOHEXOL 300 MG/ML  SOLN COMPARISON:  Most recent CT chest 08/28/2020.  01/05/2020 PET-CT. FINDINGS: Cardiovascular: The heart size is normal. No substantial pericardial effusion. Coronary artery calcification is evident. Atherosclerotic calcification is noted in the wall of the thoracic aorta. Mediastinum/Nodes: No mediastinal lymphadenopathy. There is no hilar lymphadenopathy. The esophagus has normal imaging features. There is no axillary lymphadenopathy. Lungs/Pleura: Right upper lobectomy. The apical collection of fluid and gas seen previously has decreased in size in the interval although the amount of gas in the pleural space has increased. No discrete bronchopleural fistula evident. No new suspicious nodule  or mass. Upper Abdomen: Tiny hypodensity in the medial segment left liver is stable, likely benign. Similar  appearance 1.7 cm right adrenal nodule. 1.6 cm left adrenal nodule is stable. Musculoskeletal: No worrisome lytic or sclerotic osseous abnormality. IMPRESSION: 1. Postsurgical changes in the right hemithorax consistent with right upper lobectomy. Small loculated right apical collection of pleural fluid and gas has decreased in size in the interval although the gas volume has increased. No discrete bronchopleural fistula visible on CT imaging today. 2. No new or progressive findings to raise concern for recurrent or metastatic disease. 3. Stable tiny bilateral adrenal nodules. Continued attention on follow-up recommended. Electronically Signed   By: Misty Stanley M.D.   On: 02/28/2021 11:24   I have independently reviewed the above radiology studies  and reviewed the findings with the patient.    VAS [place] ABI WITH/WO TBI  Result Date: 02/12/2021 LOWER EXTREMITY DOPPLER STUDY Indications: Claudication. High Risk Factors: Hypertension, hyperlipidemia, coronary artery disease.  Comparison Study: None Performing Technologist: Ivan Croft  Examination Guidelines: A complete evaluation includes at minimum, Doppler waveform signals and systolic blood pressure reading at the level of bilateral brachial, anterior tibial, and posterior tibial arteries, when vessel segments are accessible. Bilateral testing is considered an integral part of a complete examination. Photoelectric Plethysmograph (PPG) waveforms and toe systolic pressure readings are included as required and additional duplex testing as needed. Limited examinations for reoccurring indications may be performed as noted.  ABI Findings: +---------+------------------+-----+---------+--------+ Right    Rt Pressure (mmHg)IndexWaveform Comment  +---------+------------------+-----+---------+--------+ Brachial 149                                       +---------+------------------+-----+---------+--------+ PTA      175               1.17 triphasic         +---------+------------------+-----+---------+--------+ DP       157               1.05 triphasic         +---------+------------------+-----+---------+--------+ Great Toe97                0.65                   +---------+------------------+-----+---------+--------+ +---------+------------------+-----+---------+-------+ Left     Lt Pressure (mmHg)IndexWaveform Comment +---------+------------------+-----+---------+-------+ Brachial 149                                     +---------+------------------+-----+---------+-------+ PTA      146               0.98 triphasic        +---------+------------------+-----+---------+-------+ DP       147               0.99 triphasic        +---------+------------------+-----+---------+-------+ Great Toe112               0.75                  +---------+------------------+-----+---------+-------+ +-------+-----------+-----------+------------+------------+ ABI/TBIToday's ABIToday's TBIPrevious ABIPrevious TBI +-------+-----------+-----------+------------+------------+ Right  1.17       0.65                                +-------+-----------+-----------+------------+------------+ Left   0.99       0.75                                +-------+-----------+-----------+------------+------------+  Summary: Right: Resting right ankle-brachial index is within normal range. No evidence of significant right lower extremity arterial disease. The right toe-brachial index is abnormal. Left: Resting left ankle-brachial index is within normal range. No evidence of significant left lower extremity arterial disease. The left toe-brachial index is normal.  *See table(s) above for measurements and observations.  Electronically signed by Deitra Mayo MD on 02/12/2021 at 1:24:05 PM.    Final    CT Chest W  Contrast  Result Date: 08/28/2020 CLINICAL DATA:  Primary Cancer Type: Lung Imaging Indication: Routine surveillance Interval therapy since last imaging? 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 Primary Tumor location: Right upper lobe. Surgeries: Right upper lobectomy 01/24/2020.  Coronary stent. Chemotherapy: Yes; Ongoing? No; Most recent administration: 05/01/2020 Immunotherapy? No Radiation therapy? No EXAM: CT CHEST WITH CONTRAST TECHNIQUE: Multidetector CT imaging of the chest was performed during intravenous contrast administration. CONTRAST:  73mL OMNIPAQUE IOHEXOL 300 MG/ML  SOLN COMPARISON:  Most recent CT chest 05/27/2020.  01/05/2020 PET-CT. FINDINGS: Cardiovascular: Bovine arch. Aortic atherosclerosis. Normal heart size, without pericardial effusion. Multivessel coronary artery atherosclerosis. No central pulmonary embolism, on this non-dedicated study. Mediastinum/Nodes: No supraclavicular adenopathy. Prominent but not pathologically sized right paratracheal nodes are similar. No hilar adenopathy Lungs/Pleura: Decrease in loculated right apical pleural fluid. Trace air suspected within, including on 29/2 and 37/2. Right upper lobectomy.  No well-defined bronchopleural fistula. Left upper lobe calcified granuloma. 1 mm left lower lobe pulmonary nodule on 106/7, similar. Upper Abdomen: Subcentimeter low-density high left hepatic lobe lesion is likely a cyst. Normal imaged portions of the spleen, pancreas, gallbladder, kidneys. Surgical changes of gastrojejunostomy. Bilateral adrenal thickening with right adrenal nodularity at up to 1.6 cm on 149/2, similar. Musculoskeletal: Lower cervical spondylosis. IMPRESSION: 1. Status post right upper lobectomy. Decrease in loculated right apical pleural fluid with trace pleural air. No well-defined bronchopleural fistula. 2. No findings of  recurrent or metastatic disease. Prominent high right paratracheal nodes are similar, favored to be reactive. Recommend attention on follow-up. 3. Right adrenal adenoma. 4. Aortic atherosclerosis (ICD10-I70.0), coronary artery atherosclerosis and emphysema (ICD10-J43.9). Electronically Signed   By: Abigail Miyamoto M.D.   On: 08/28/2020 10:16   Recent Labs: Lab Results  Component Value Date   WBC 6.2 02/28/2021   HGB 13.0 02/28/2021   HCT 39.3 02/28/2021   PLT 187 02/28/2021   GLUCOSE 104 (H) 02/28/2021   ALT 21 02/28/2021   AST 17 02/28/2021   NA 140 02/28/2021   K 4.3 02/28/2021   CL 107 02/28/2021   CREATININE 0.94 02/28/2021   BUN 24 (H) 02/28/2021   CO2 24 02/28/2021   INR 1.0 01/22/2020      Assessment / Plan:   #1 patient stable after robotic right upper lobectomy and lymph node dissection without with what ultimately was found to be poorly differentiated carcinoma with sarcomatous features,-stage IIb.  Now completed adjuvant systemic chemotherapy with carboplatin for AUC of 6 and paclitaxel 200 mg/M2 every 3 weeks with Neulasta support. -No evidence of recurrence on follow-up CT scan-patient will continue to be followed with serial CT scans of the chest in the medical oncology clinic-have not made a return appointment to the surgical office at this time but he could be seen at any time as necessary.   #2 known coronary occlusive disease-currently the patient has complaints of both dyspnea on exertion which obviously may be related to his loss of lung function  but in addition now also has associated substernal discomfort-he has known coronary occlusive disease -he was referred back to Dr. Percival Spanish for management of his known coronary occlusive disease.  #3 Barrett's esophagus-patient has a history of Barrett's esophagus most recent biopsy showed no dysplasia.  Strongly encouraged him to keep all follow-up as recommended for the evaluation of his Barrett's esophagus.       Medication Changes: No orders of the defined types were placed in this encounter.      Grace Isaac 03/14/2021 2:36 PM

## 2021-04-23 DIAGNOSIS — D1801 Hemangioma of skin and subcutaneous tissue: Secondary | ICD-10-CM | POA: Diagnosis not present

## 2021-04-23 DIAGNOSIS — L570 Actinic keratosis: Secondary | ICD-10-CM | POA: Diagnosis not present

## 2021-04-23 DIAGNOSIS — L821 Other seborrheic keratosis: Secondary | ICD-10-CM | POA: Diagnosis not present

## 2021-04-23 DIAGNOSIS — L814 Other melanin hyperpigmentation: Secondary | ICD-10-CM | POA: Diagnosis not present

## 2021-04-23 DIAGNOSIS — D225 Melanocytic nevi of trunk: Secondary | ICD-10-CM | POA: Diagnosis not present

## 2021-05-14 ENCOUNTER — Other Ambulatory Visit: Payer: Self-pay | Admitting: Cardiology

## 2021-07-02 IMAGING — CR DG CHEST 2V
2 series · 2 of 2 positions shown · non-contrast
Comparison: 02/01/2020

CLINICAL DATA: Lung cancer, status post bronchoscopy and lymph node
dissection 01/24/2020. Shortness of breath.

EXAM:
CHEST - 2 VIEW

[w chest pa]
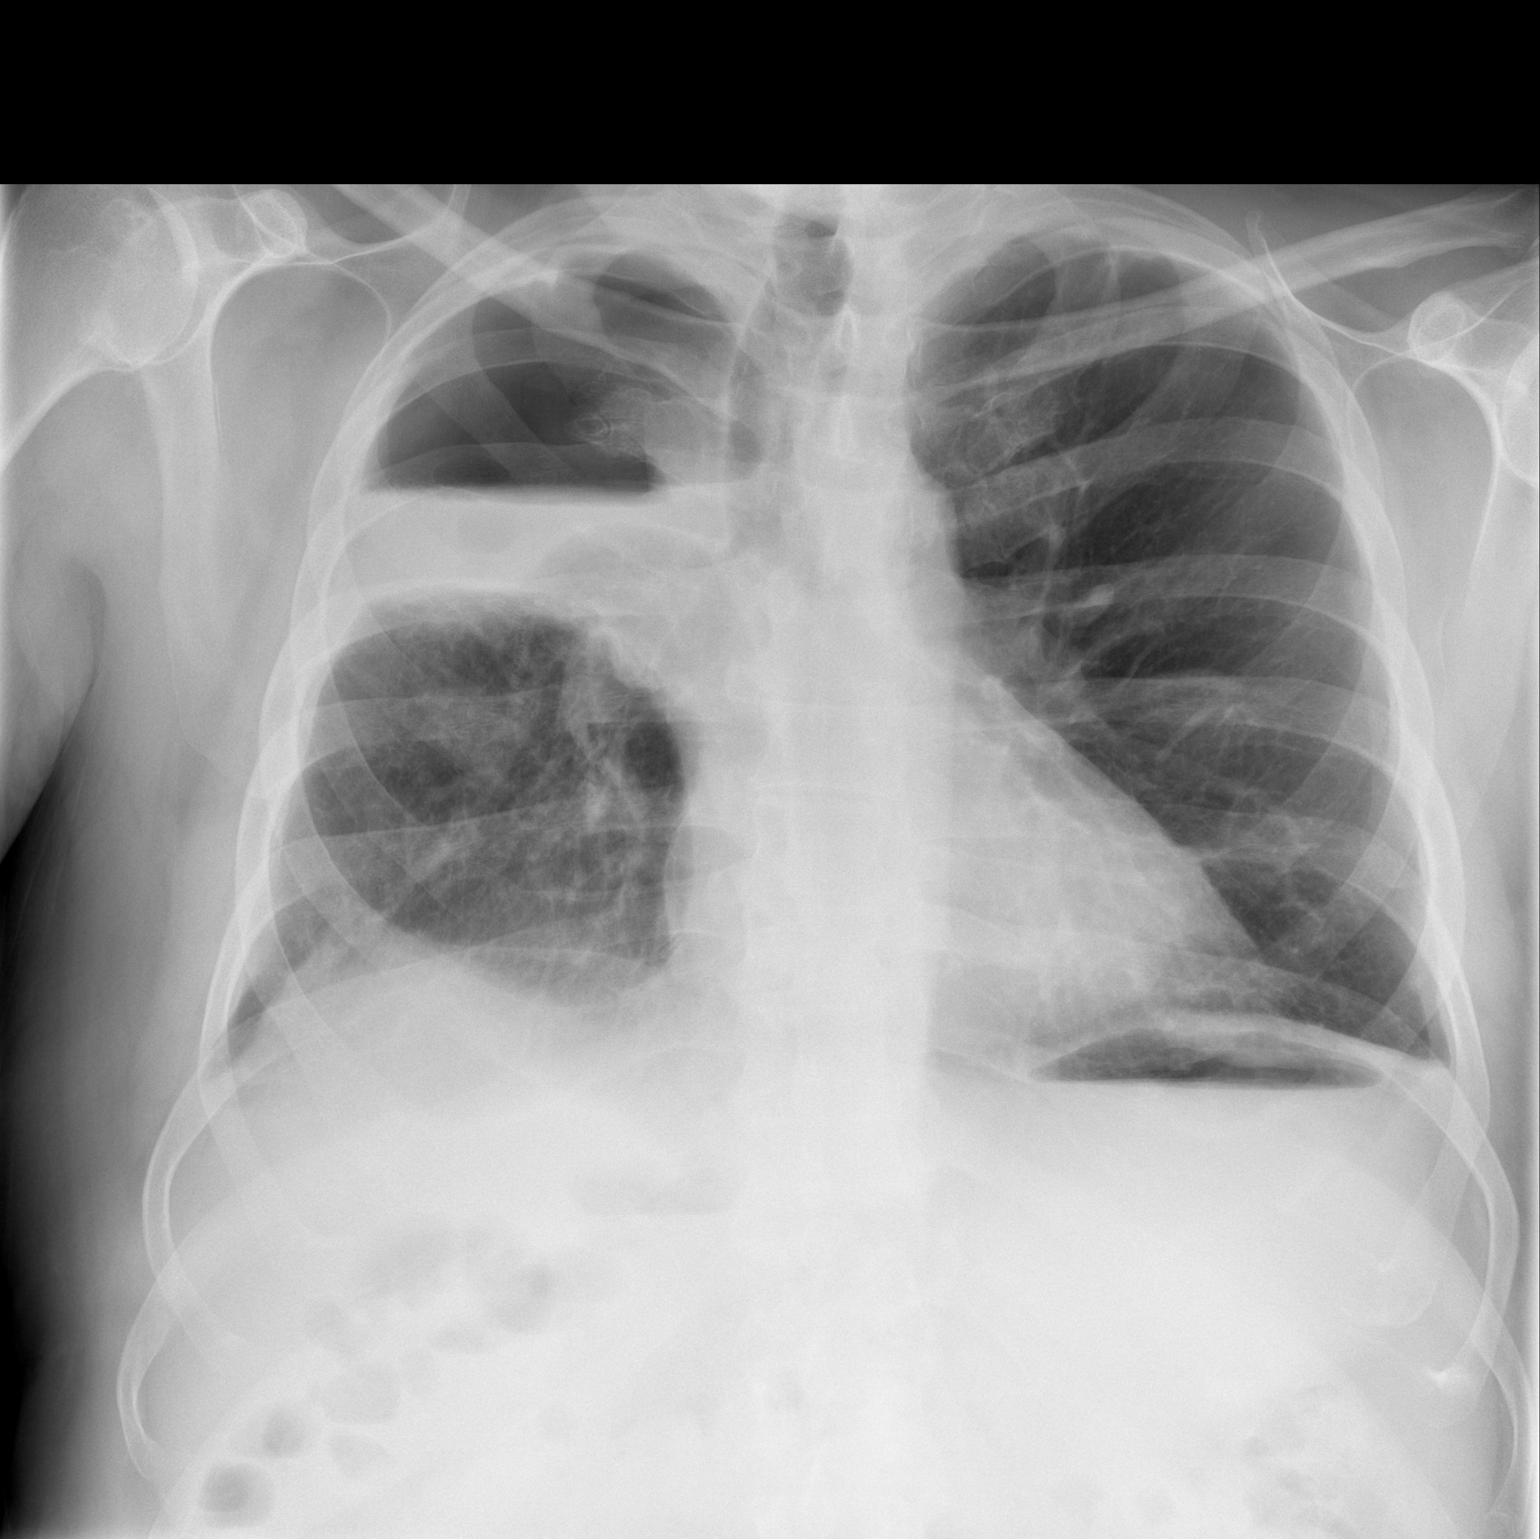

[w chest lat]
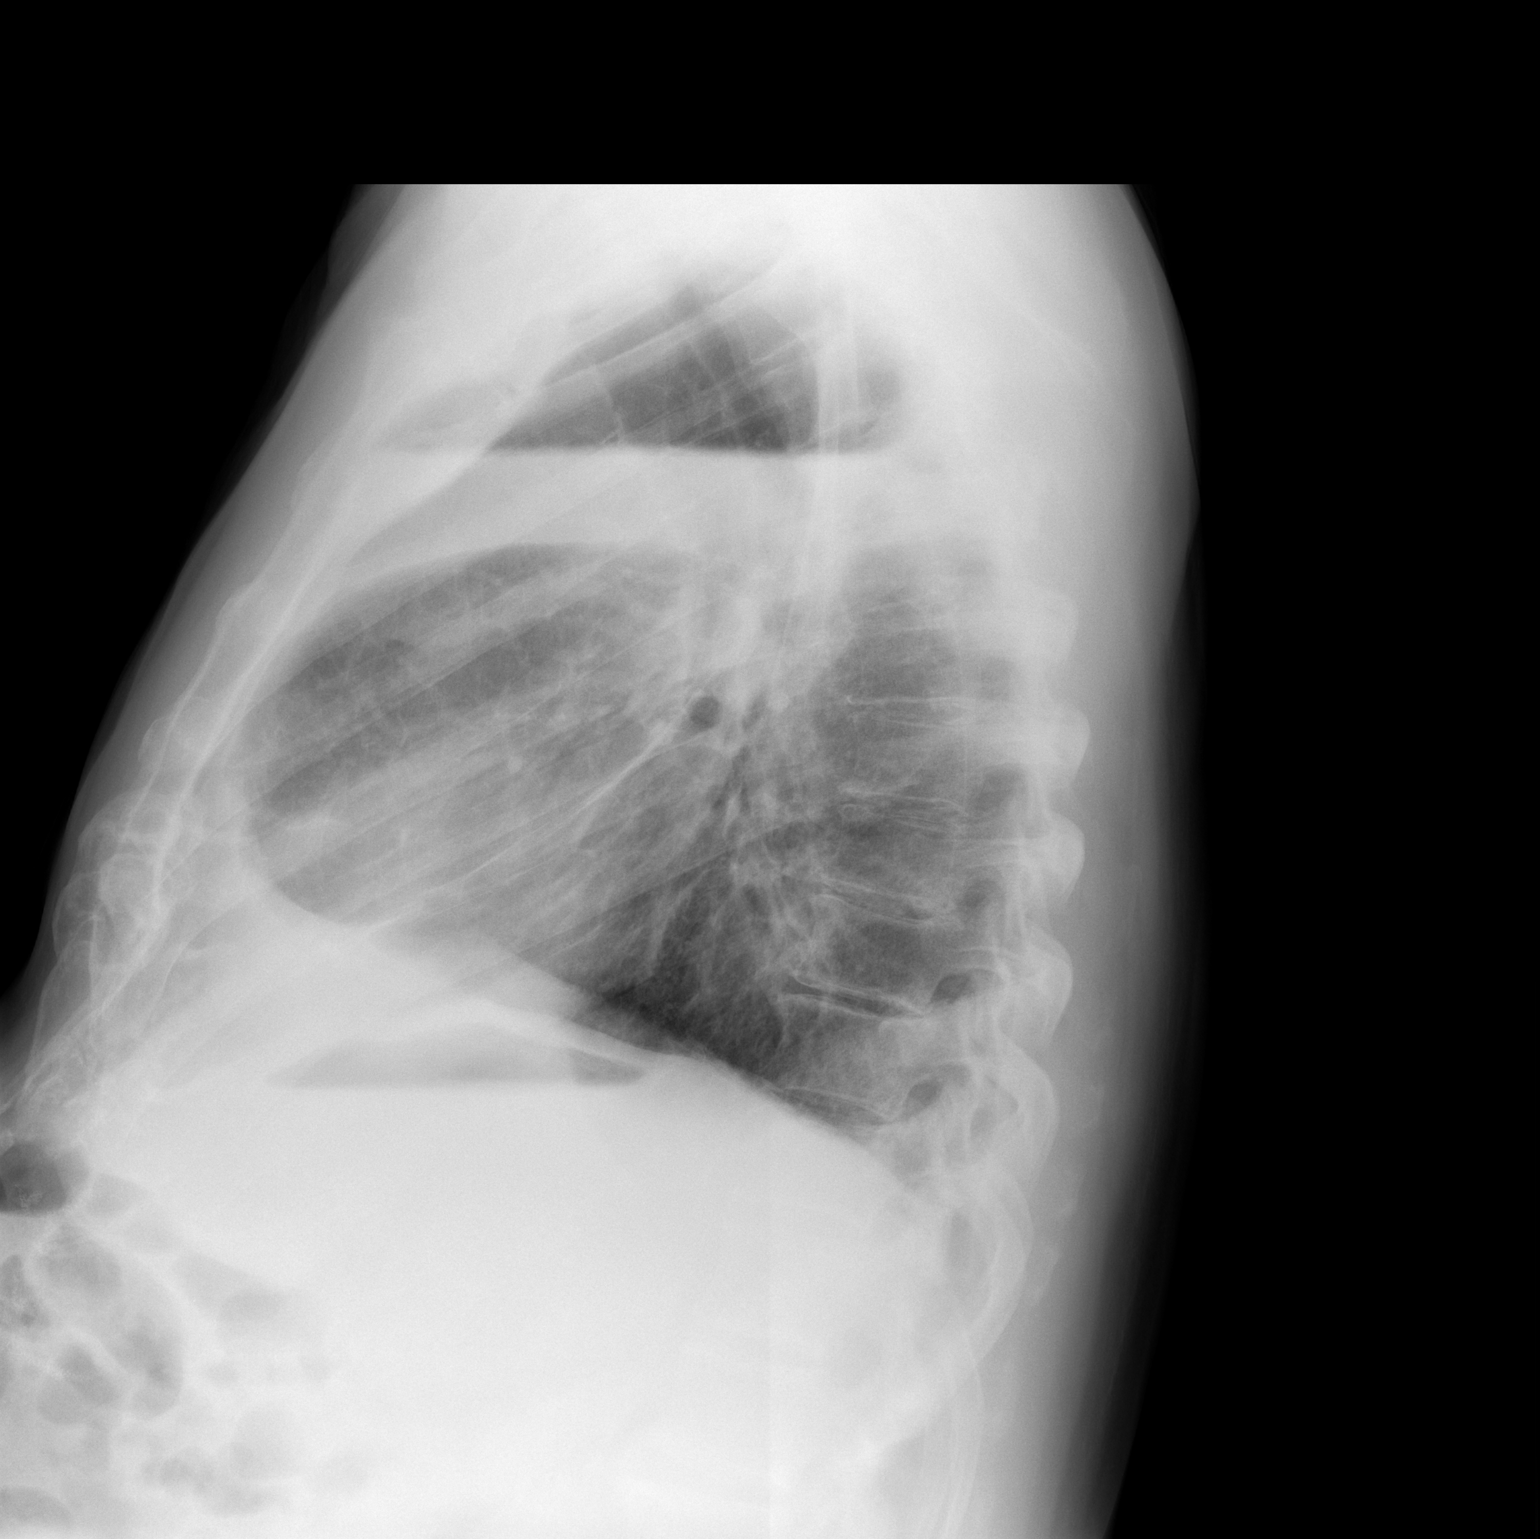

[2 of 2 positions shown; findings below may reference images not displayed]

FINDINGS: Stable right apical hydropneumothorax. Left lung is expanded. Mild
right basilar atelectasis. Stable cardiomediastinal silhouette. No
aggressive osseous lesion.
IMPRESSION: Stable right apical hydropneumothorax.

## 2021-07-17 DIAGNOSIS — N529 Male erectile dysfunction, unspecified: Secondary | ICD-10-CM | POA: Diagnosis not present

## 2021-07-17 DIAGNOSIS — J439 Emphysema, unspecified: Secondary | ICD-10-CM | POA: Diagnosis not present

## 2021-07-17 DIAGNOSIS — I739 Peripheral vascular disease, unspecified: Secondary | ICD-10-CM | POA: Diagnosis not present

## 2021-07-17 DIAGNOSIS — B356 Tinea cruris: Secondary | ICD-10-CM | POA: Diagnosis not present

## 2021-07-17 DIAGNOSIS — Z9861 Coronary angioplasty status: Secondary | ICD-10-CM | POA: Diagnosis not present

## 2021-07-17 DIAGNOSIS — I700 Atherosclerosis of aorta: Secondary | ICD-10-CM | POA: Diagnosis not present

## 2021-07-17 DIAGNOSIS — Z85118 Personal history of other malignant neoplasm of bronchus and lung: Secondary | ICD-10-CM | POA: Diagnosis not present

## 2021-07-17 DIAGNOSIS — I10 Essential (primary) hypertension: Secondary | ICD-10-CM | POA: Diagnosis not present

## 2021-07-17 DIAGNOSIS — K5901 Slow transit constipation: Secondary | ICD-10-CM | POA: Diagnosis not present

## 2021-08-09 IMAGING — DX DG ABDOMEN 2V
3 series · 3 of 3 positions shown · non-contrast
Comparison: No recent.

CLINICAL DATA: Abdominal pain.  Right flank pain.

EXAM:
ABDOMEN - 2 VIEW

[dg abd 2 views (1 of 3)]
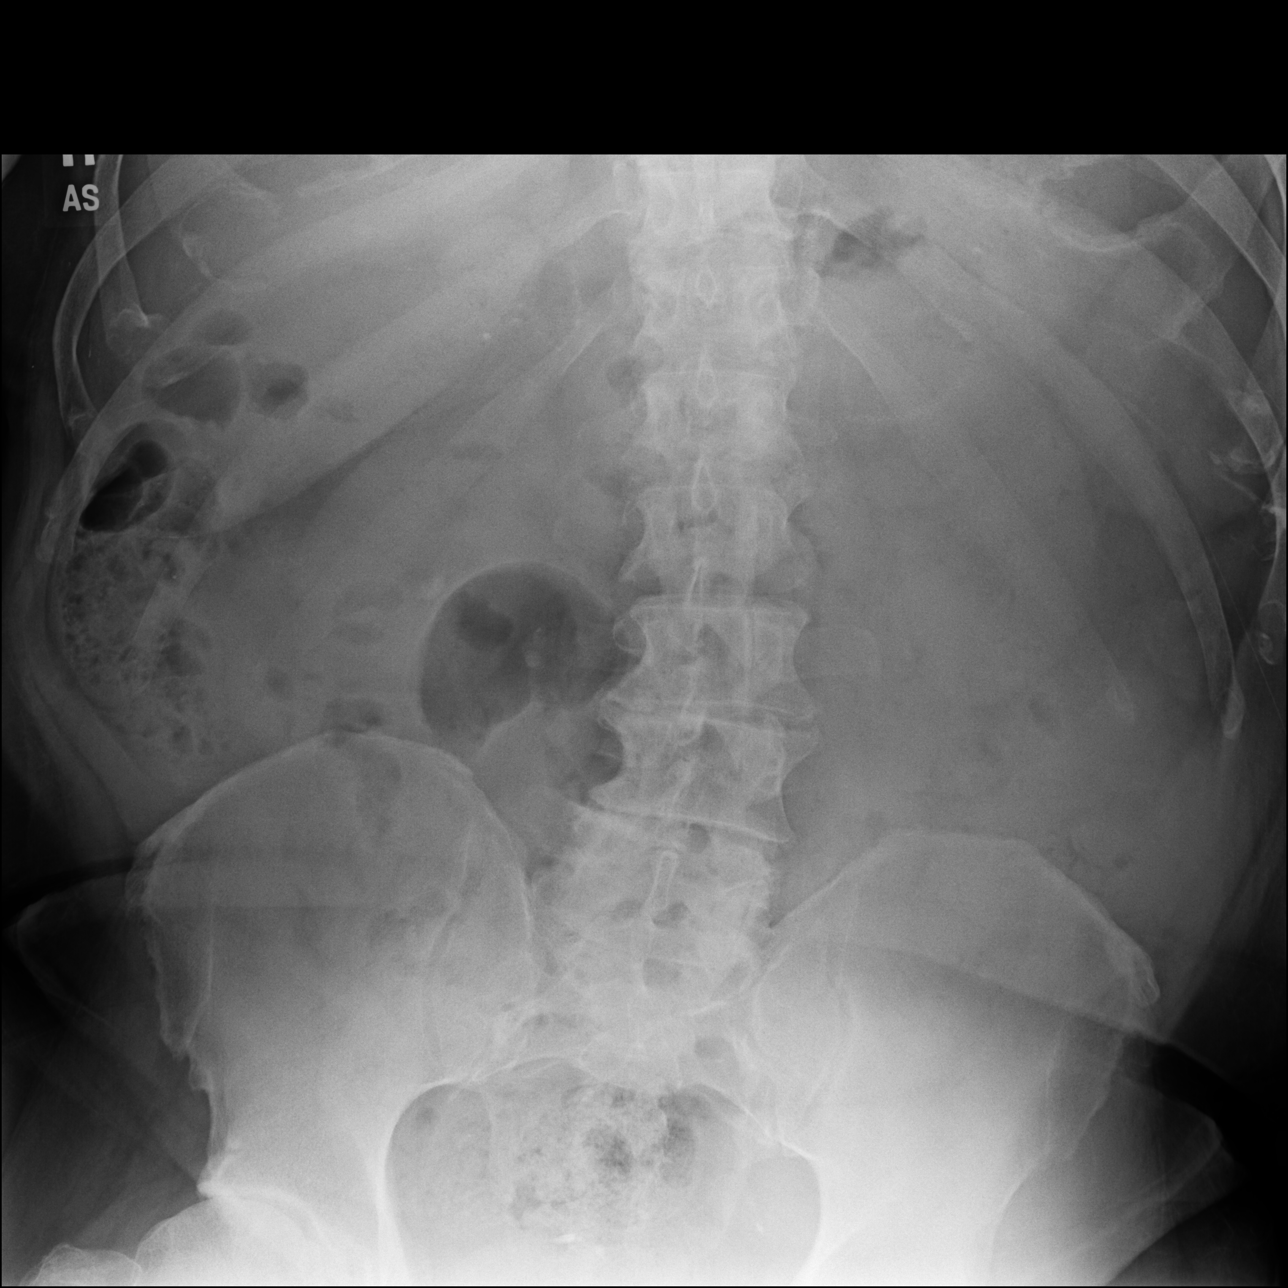

[dg abd 2 views (2 of 3)]
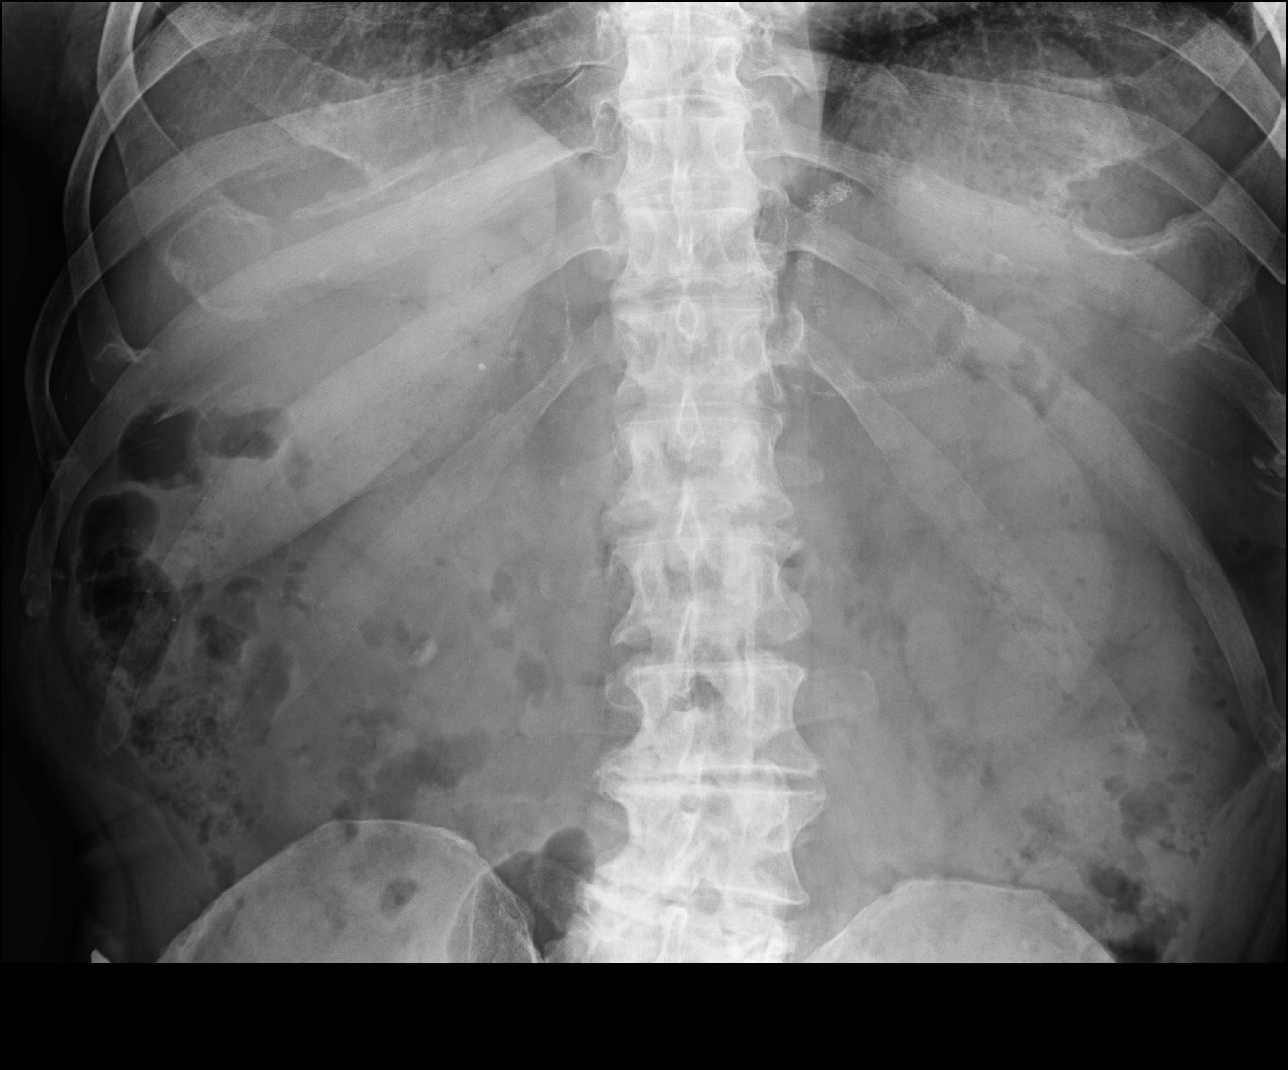

[dg abd 2 views (3 of 3)]
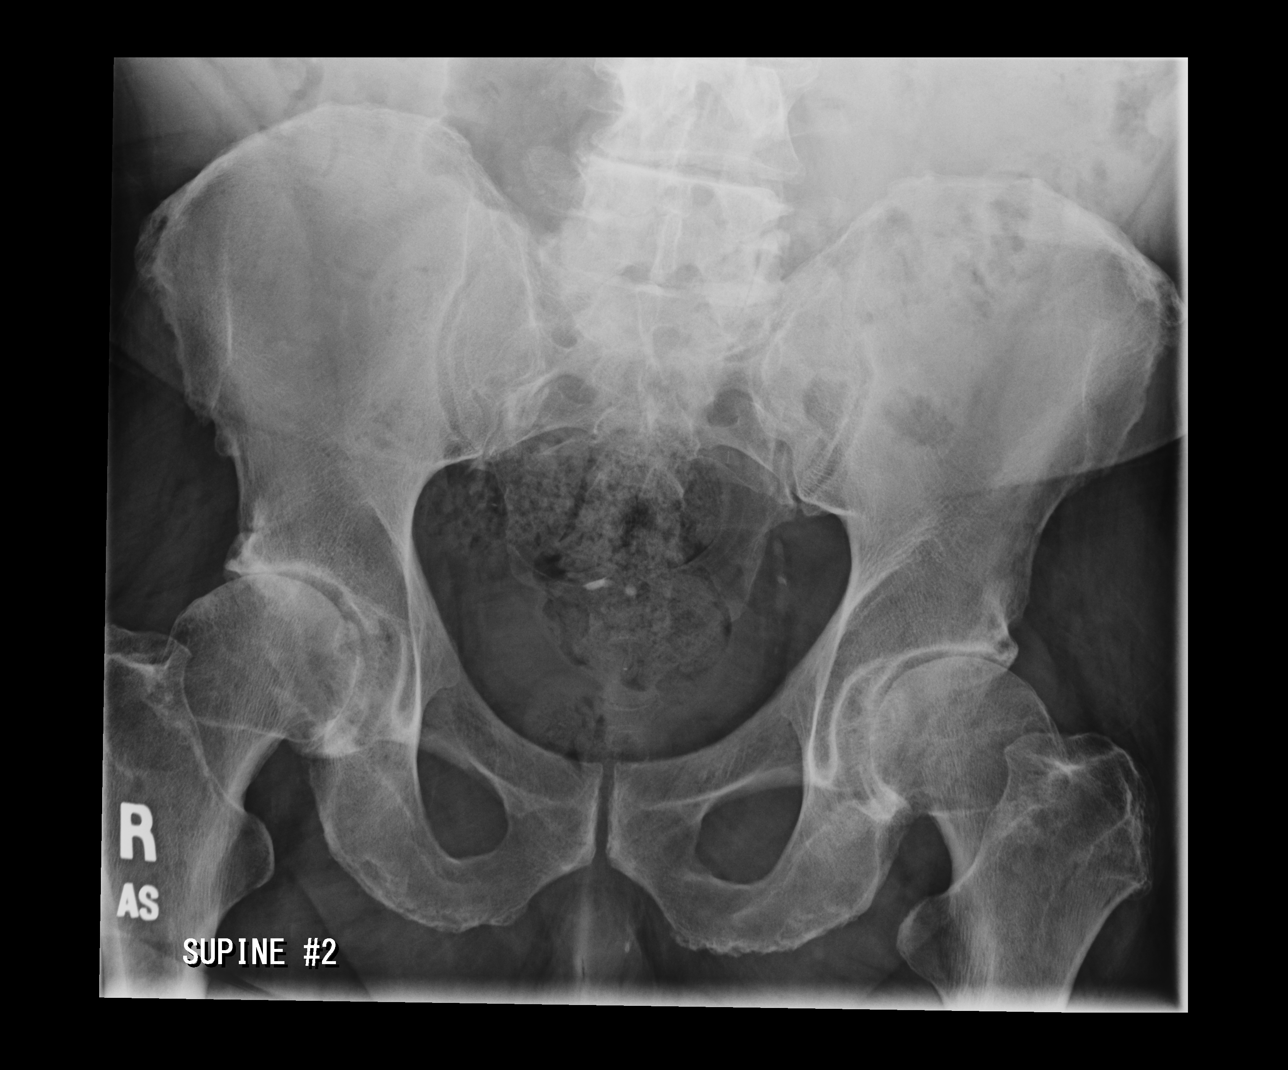

[3 of 3 positions shown; findings below may reference images not displayed]

FINDINGS: Surgical sutures upper abdomen. No bowel distention. Stool noted
throughout the colon. Aortoiliac and visceral vascular
calcification. 1 cm calcific density noted over the right kidney.
This is consistent with right nephrolithiasis. Pelvic calcifications
noted. These could represent phleboliths. Distal ureteral or bladder
stones cannot be excluded. Aortoiliac and visceral atherosclerotic
vascular calcification. Degenerative change thoracolumbar spine and
both hips.
IMPRESSION: 1 cm stone noted projected over the right kidney consistent with
nephrolithiasis. Pelvic calcifications are noted. These could
represent phleboliths. Distal ureteral or bladder stones cannot be
excluded.

## 2021-08-11 ENCOUNTER — Encounter: Payer: Self-pay | Admitting: Internal Medicine

## 2021-09-01 ENCOUNTER — Encounter (HOSPITAL_COMMUNITY): Payer: Self-pay

## 2021-09-01 ENCOUNTER — Other Ambulatory Visit: Payer: Self-pay

## 2021-09-01 ENCOUNTER — Ambulatory Visit (HOSPITAL_COMMUNITY)
Admission: RE | Admit: 2021-09-01 | Discharge: 2021-09-01 | Disposition: A | Discharge status: Home / Self Care | Payer: Medicare Other | Source: Ambulatory Visit | Attending: Internal Medicine | Admitting: Internal Medicine

## 2021-09-01 ENCOUNTER — Inpatient Hospital Stay: Payer: Medicare Other | Attending: Internal Medicine

## 2021-09-01 ENCOUNTER — Other Ambulatory Visit: Payer: Self-pay | Admitting: Cardiology

## 2021-09-01 DIAGNOSIS — C3490 Malignant neoplasm of unspecified part of unspecified bronchus or lung: Secondary | ICD-10-CM | POA: Diagnosis not present

## 2021-09-01 DIAGNOSIS — I700 Atherosclerosis of aorta: Secondary | ICD-10-CM | POA: Diagnosis not present

## 2021-09-01 DIAGNOSIS — Z7982 Long term (current) use of aspirin: Secondary | ICD-10-CM | POA: Insufficient documentation

## 2021-09-01 DIAGNOSIS — I252 Old myocardial infarction: Secondary | ICD-10-CM | POA: Insufficient documentation

## 2021-09-01 DIAGNOSIS — Z9221 Personal history of antineoplastic chemotherapy: Secondary | ICD-10-CM | POA: Insufficient documentation

## 2021-09-01 DIAGNOSIS — Z85118 Personal history of other malignant neoplasm of bronchus and lung: Secondary | ICD-10-CM | POA: Insufficient documentation

## 2021-09-01 DIAGNOSIS — I10 Essential (primary) hypertension: Secondary | ICD-10-CM | POA: Insufficient documentation

## 2021-09-01 DIAGNOSIS — Z79899 Other long term (current) drug therapy: Secondary | ICD-10-CM | POA: Insufficient documentation

## 2021-09-01 DIAGNOSIS — Z902 Acquired absence of lung [part of]: Secondary | ICD-10-CM | POA: Insufficient documentation

## 2021-09-01 DIAGNOSIS — J90 Pleural effusion, not elsewhere classified: Secondary | ICD-10-CM | POA: Diagnosis not present

## 2021-09-01 LAB — CMP (CANCER CENTER ONLY)
ALT: 23 U/L (ref 0–44)
AST: 18 U/L (ref 15–41)
Albumin: 3.7 g/dL (ref 3.5–5.0)
Alkaline Phosphatase: 60 U/L (ref 38–126)
Anion gap: 8 (ref 5–15)
BUN: 16 mg/dL (ref 8–23)
CO2: 23 mmol/L (ref 22–32)
Calcium: 8.7 mg/dL — ABNORMAL LOW (ref 8.9–10.3)
Chloride: 107 mmol/L (ref 98–111)
Creatinine: 0.88 mg/dL (ref 0.61–1.24)
GFR, Estimated: >60 mL/min (ref >60)
Glucose, Bld: 101 mg/dL — ABNORMAL HIGH (ref 70–99)
Potassium: 4 mmol/L (ref 3.5–5.1)
Sodium: 138 mmol/L (ref 135–145)
Total Bilirubin: 0.5 mg/dL (ref 0.3–1.2)
Total Protein: 6 g/dL — ABNORMAL LOW (ref 6.5–8.1)

## 2021-09-01 LAB — CBC WITH DIFFERENTIAL (CANCER CENTER ONLY)
Abs Immature Granulocytes: 0.02 10*3/uL (ref 0.00–0.07)
Basophils Absolute: 0 10*3/uL (ref 0.0–0.1)
Basophils Relative: 1 %
Eosinophils Absolute: 0.1 10*3/uL (ref 0.0–0.5)
Eosinophils Relative: 2 %
HCT: 36.4 % — ABNORMAL LOW (ref 39.0–52.0)
Hemoglobin: 12 g/dL — ABNORMAL LOW (ref 13.0–17.0)
Immature Granulocytes: 0 %
Lymphocytes Relative: 23 %
Lymphs Abs: 1.3 10*3/uL (ref 0.7–4.0)
MCH: 27.6 pg (ref 26.0–34.0)
MCHC: 33 g/dL (ref 30.0–36.0)
MCV: 83.9 fL (ref 80.0–100.0)
Monocytes Absolute: 0.4 10*3/uL (ref 0.1–1.0)
Monocytes Relative: 8 %
Neutro Abs: 3.8 10*3/uL (ref 1.7–7.7)
Neutrophils Relative %: 66 %
Platelet Count: 171 10*3/uL (ref 150–400)
RBC: 4.34 MIL/uL (ref 4.22–5.81)
RDW: 14.4 % (ref 11.5–15.5)
WBC Count: 5.7 10*3/uL (ref 4.0–10.5)
nRBC: 0 % (ref 0.0–0.2)

## 2021-09-01 MED ORDER — IOHEXOL 350 MG/ML SOLN
100.0000 mL | Freq: Once | INTRAVENOUS | Status: AC | PRN
  Administered 2021-09-01: 60 mL via INTRAVENOUS

## 2021-09-03 ENCOUNTER — Other Ambulatory Visit: Payer: Self-pay

## 2021-09-03 ENCOUNTER — Inpatient Hospital Stay (HOSPITAL_BASED_OUTPATIENT_CLINIC_OR_DEPARTMENT_OTHER): Payer: Medicare Other | Admitting: Internal Medicine

## 2021-09-03 VITALS — BP 149/70 | HR 57 | Temp 97.6°F | Resp 17 | Wt 202.6 lb

## 2021-09-03 DIAGNOSIS — Z902 Acquired absence of lung [part of]: Secondary | ICD-10-CM | POA: Diagnosis not present

## 2021-09-03 DIAGNOSIS — C3490 Malignant neoplasm of unspecified part of unspecified bronchus or lung: Secondary | ICD-10-CM | POA: Diagnosis not present

## 2021-09-03 DIAGNOSIS — Z79899 Other long term (current) drug therapy: Secondary | ICD-10-CM | POA: Diagnosis not present

## 2021-09-03 DIAGNOSIS — Z9221 Personal history of antineoplastic chemotherapy: Secondary | ICD-10-CM | POA: Diagnosis not present

## 2021-09-03 DIAGNOSIS — Z7982 Long term (current) use of aspirin: Secondary | ICD-10-CM | POA: Diagnosis not present

## 2021-09-03 DIAGNOSIS — Z85118 Personal history of other malignant neoplasm of bronchus and lung: Secondary | ICD-10-CM | POA: Diagnosis not present

## 2021-09-03 DIAGNOSIS — I10 Essential (primary) hypertension: Secondary | ICD-10-CM | POA: Diagnosis not present

## 2021-09-03 DIAGNOSIS — I252 Old myocardial infarction: Secondary | ICD-10-CM | POA: Diagnosis not present

## 2021-09-03 NOTE — Progress Notes (Signed)
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 Servando Snare. 2) Adjuvant systemic chemotherapy with carboplatin for AUC of 6 and paclitaxel 200 mg/M2 every 3 weeks with Neulasta support.  First dose February 28, 2020.  Status post 4 cycles.  Last dose was given May 01, 2020.  CURRENT THERAPY: Observation.  INTERVAL HISTORY: Jaquari Daniel Colon 75 y.o. male returns to the clinic today for follow-up visit accompanied by his wife.  The patient is feeling fine today with no concerning complaints except for mild shortness of breath with exertion as well as constipation.  He denied having any current chest pain, cough or hemoptysis.  He denied having any fever or chills.  He has no nausea, vomiting, abdominal pain or diarrhea.  He has no significant weight loss or night sweats.  He has no headache or visual changes.  The patient is here today for evaluation with repeat CT scan of the chest for restaging of his disease.   MEDICAL HISTORY: Past Medical History:  Diagnosis Date   Allergy    Arthritis    Barrett's esophagus    CAD (coronary artery disease)    a. Stent to the [place] (in [place]);  b. ETT(11/13/13): abnormal with early ST changes to suggest ischemia;   c. LHC (11/17/13):  pLAD 30-40, D1 30, mLAD 70-75, mCFX 70, mRCA stent 40 (ISR), EF 55-65%.  Medical Rx   Cataract    left eye removed, right immature   Colon polyps    Dyslipidemia    GERD (gastroesophageal reflux disease)    Hypertension    Myocardial infarction ([place]) 1996   Nephrolithiasis    kidney stone   NSCL Ca dx'd 12/2019   PUD (peptic ulcer disease)     ALLERGIES:  has No Known  Allergies.  MEDICATIONS:  Current Outpatient Medications  Medication Sig Dispense Refill   albuterol (VENTOLIN HFA) 108 (90 Base) MCG/ACT inhaler      aspirin EC 81 MG tablet Take 81 mg by mouth 3 (three) times a week. In the morning     atorvastatin (LIPITOR) 40 MG tablet TAKE 1 TABLET DAILY 90 tablet 2   BREZTRI AEROSPHERE 160-9-4.8 MCG/ACT AERO Inhale into the lungs 2 (two) times daily.      carvedilol (COREG) 12.5 MG tablet Take 12.5 mg by mouth daily.      Coenzyme Q10 (CO Q-10) 100 MG CAPS Take 100 mg by mouth daily.      ezetimibe (ZETIA) 10 MG tablet Take 10 mg by mouth daily.     fish oil-omega-3 fatty acids 1000 MG capsule Take 1,000 mg by mouth daily.      guaiFENesin (MUCINEX) 600 MG 12 hr tablet Take 1 tablet (600 mg total) by mouth 2 (two) times daily as needed for cough or to loosen phlegm.     ipratropium (ATROVENT) 0.03 % nasal spray Place 1 spray into both nostrils daily.      MICARDIS HCT 80-12.5 MG tablet TAKE 1 TABLET DAILY 90 tablet 3   Multiple Vitamin (MULTIVITAMIN WITH MINERALS) TABS tablet Take 1 tablet by mouth daily.     nitroGLYCERIN (NITROLINGUAL) 0.4 MG/SPRAY spray USE 1 SPRAY ON OR  UNDER THE TONGUE EVERY 5 MINUTES FOR 3 DOSES AS NEEDED FOR CHEST PAIN AS DIRECTED BY PRESCRIBER OR PACKAGE INSTRUCTIONS 24 g 3   omeprazole (PRILOSEC) 10 MG capsule Take 10 mg by mouth daily.     No current facility-administered medications for this visit.    SURGICAL HISTORY:  Past Surgical History:  Procedure Laterality Date   ANGIOPLASTY     stent - rt cor. art   BILROTH II PROCEDURE     COLONOSCOPY     INTERCOSTAL NERVE BLOCK Right 01/24/2020   Procedure: Intercostal Nerve Block;  Surgeon: Grace Isaac, MD;  Location: [facility];  Service: Thoracic;  Laterality: Right;   LEFT HEART CATHETERIZATION WITH CORONARY ANGIOGRAM N/A 11/17/2013   Procedure: LEFT HEART CATHETERIZATION WITH CORONARY ANGIOGRAM;  Surgeon: Blane Ohara, MD;  Location[facility] CATH LAB;  Service:  Cardiovascular;  Laterality: N/A;   LYMPH NODE DISSECTION N/A 01/24/2020   Procedure: LYMPH NODE DISSECTION;  Surgeon: Grace Isaac, MD;  Location: [facility] OR;  Service: Thoracic;  Laterality: N/A;   POLYPECTOMY     ulcer surgery     ULNAR NERVE TRANSPOSITION Right 08/05/2017   Procedure: RIGHT ULNAR NERVE DECOMPRESSION;  Surgeon: Leanora Cover, MD;  Location: [facility];  Service: Orthopedics;  Laterality: Right;   VIDEO BRONCHOSCOPY N/A 01/24/2020   Procedure: VIDEO BRONCHOSCOPY;  Surgeon: Grace Isaac, MD;  Location: [facility] OR;  Service: Thoracic;  Laterality: N/A;    REVIEW OF SYSTEMS:  A comprehensive review of systems was negative except for: Respiratory: positive for dyspnea on exertion Gastrointestinal: positive for constipation   PHYSICAL EXAMINATION: General appearance: alert, cooperative, and no distress Head: Normocephalic, without obvious abnormality, atraumatic Neck: no adenopathy, no JVD, supple, symmetrical, trachea midline, and thyroid not enlarged, symmetric, no tenderness/mass/nodules Lymph nodes: Cervical, supraclavicular, and axillary nodes normal. Resp: clear to auscultation bilaterally Back: symmetric, no curvature. ROM normal. No CVA tenderness. Cardio: regular rate and rhythm, S1, S2 normal, no murmur, click, rub or gallop GI: soft, non-tender; bowel sounds normal; no masses,  no organomegaly Extremities: extremities normal, atraumatic, no cyanosis or edema  ECOG PERFORMANCE STATUS: 1 - Symptomatic but completely ambulatory  Blood pressure (!) 149/70, pulse (!) 57, temperature 97.6 F (36.4 C), temperature source Oral, resp. rate 17, weight 202 lb 9.6 oz (91.9 kg), SpO2 100 %.  LABORATORY DATA: Lab Results  Component Value Date   WBC 5.7 09/01/2021   HGB 12.0 (L) 09/01/2021   HCT 36.4 (L) 09/01/2021   MCV 83.9 09/01/2021   PLT 171 09/01/2021      Chemistry      Component Value Date/Time   NA 138 09/01/2021 0732   K 4.0 09/01/2021 0732    CL 107 09/01/2021 0732   CO2 23 09/01/2021 0732   BUN 16 09/01/2021 0732   CREATININE 0.88 09/01/2021 0732      Component Value Date/Time   CALCIUM 8.7 (L) 09/01/2021 0732   ALKPHOS 60 09/01/2021 0732   AST 18 09/01/2021 0732   ALT 23 09/01/2021 0732   BILITOT 0.5 09/01/2021 0732       RADIOGRAPHIC STUDIES: CT Chest W Contrast  Result Date: 09/01/2021 CLINICAL DATA:  Non-small cell lung cancer staging evaluation in a 75 year old male EXAM: CT CHEST WITH CONTRAST TECHNIQUE: Multidetector CT imaging of the chest was performed during intravenous contrast administration. CONTRAST:  71mL OMNIPAQUE IOHEXOL 350 MG/ML SOLN COMPARISON:  Multiple prior studies most recent February 28, 2021. FINDINGS: Cardiovascular: Calcified aortic atherosclerosis without aneurysmal  dilation. Normal caliber central pulmonary vessels. Three-vessel coronary artery disease. Normal heart size without substantial pericardial effusion. Mediastinum/Nodes: No signs of thoracic inlet, axillary or mediastinal adenopathy. No internal mammary adenopathy. No hilar adenopathy. Distortion of the RIGHT hilum and associated soft tissue following partial lung resection with similar appearance to prior imaging, see below. Esophagus unremarkable. Lungs/Pleura: Loculated complex pleural effusion in the RIGHT upper lobe displays no change measuring approximately 5.6 x 3.2 cm. This is contiguous with perihilar scarring following RIGHT upper lobectomy. Small amount of fluid remains associated with this area and there is a peripheral rim of soft tissue that is also unchanged. No increasingly nodular areas to indicate disease recurrence locally. No new pulmonary nodules.  LEFT chest is clear. Upper Abdomen: Incidental imaging of upper abdominal contents without acute process or concerning finding. Imaged portions of liver, gallbladder, pancreas, spleen, adrenal glands and kidneys without change. Stable RIGHT adrenal adenoma. Postoperative changes  partial gastrectomy with gastrojejunostomy as before Musculoskeletal: No acute musculoskeletal process. Spinal degenerative changes. IMPRESSION: Stable post treatment changes in the RIGHT upper lobe following partial lung resection. No evidence of disease recurrence locally or metastatic disease to the chest. Stable loculated apical collection, attention on follow-up. Stable RIGHT adrenal adenoma. Three-vessel coronary artery disease. Aortic Atherosclerosis (ICD10-I70.0). Electronically Signed   By: Zetta Bills M.D.   On: 09/01/2021 15:30     ASSESSMENT AND PLAN: This is a very pleasant 75 years old white male recently diagnosed with a stage IIb non-small cell lung cancer with sarcomatoid features in February 2021 status post right upper lobectomy with lymph node dissection under the care of Dr. Servando Snare. The patient is also status post 4 cycles of adjuvant systemic chemotherapy with carboplatin and paclitaxel completed in May 2021. The patient is currently on observation and he is feeling fine today with no concerning complaints. He had repeat CT scan of the chest performed recently.  I personally and independently reviewed the scan and discussed the results with the patient and his wife. His scan showed no concerning findings for disease recurrence or metastasis. I recommended for him to continue on observation with repeat CT scan of the chest in 6 months. For the constipation, he will continue on treatment with laxative and reach out to his gastroenterologist Dr. Hilarie Fredrickson for evaluation. The patient was advised to call immediately if he has any other concerning symptoms in the interval. The patient voices understanding of current disease status and treatment options and is in agreement with the current care plan.  All questions were answered. The patient knows to call the clinic with any problems, questions or concerns. We can certainly see the patient much sooner if necessary.  Disclaimer: This  note was dictated with voice recognition software. Similar sounding words can inadvertently be transcribed and may not be corrected upon review.

## 2021-09-04 ENCOUNTER — Telehealth: Payer: Self-pay | Admitting: Internal Medicine

## 2021-09-04 NOTE — Telephone Encounter (Signed)
Scheduled appt per 9/14 los - mailed letter with appt date and time. Central radiology to call patient with ct appt.

## 2021-10-08 ENCOUNTER — Encounter: Payer: Self-pay | Admitting: Internal Medicine

## 2021-10-13 DIAGNOSIS — Z23 Encounter for immunization: Secondary | ICD-10-CM | POA: Diagnosis not present

## 2021-10-20 ENCOUNTER — Ambulatory Visit (INDEPENDENT_AMBULATORY_CARE_PROVIDER_SITE_OTHER): Payer: Medicare Other | Admitting: Pulmonary Disease

## 2021-10-20 ENCOUNTER — Encounter: Payer: Self-pay | Admitting: Pulmonary Disease

## 2021-10-20 ENCOUNTER — Other Ambulatory Visit: Payer: Self-pay

## 2021-10-20 VITALS — BP 122/60 | HR 61 | Temp 98.1°F | Ht 68.5 in | Wt 198.2 lb

## 2021-10-20 DIAGNOSIS — J449 Chronic obstructive pulmonary disease, unspecified: Secondary | ICD-10-CM

## 2021-10-20 DIAGNOSIS — I2510 Atherosclerotic heart disease of native coronary artery without angina pectoris: Secondary | ICD-10-CM

## 2021-10-20 NOTE — Patient Instructions (Signed)
I have reviewed your recent CT scan which does not show cancer recurrence.  This is good news Continue the inhaler Continue to maintain an active lifestyle Follow-up in 1 year.

## 2021-10-20 NOTE — Progress Notes (Signed)
Daniel Colon    [id number]    Oct 11, 1946  Primary Care Physician:Paterson, Quillian Quince, MD  Referring Physician: Leanna Battles, MD [address] 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 Status post right upper lobectomy with lymph node dissection, status post 4 cycles of adjuvant systemic chemotherapy with carboplatin and paclitaxel completed in May 2021.  Complains of dyspnea on exertion for the past few months.  No symptoms at rest.  Has occasional cough with mucus production.  No fevers, chills  Pets: 2 cats.  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  No ongoing exposures with mold, hot tub, Jacuzzi.  No down pillows or comforters Smoking history: 40-pack-year smoker.  Quit in 2012 Travel history: Originally from [place].  No significant recent travel Relevant family history: No significant family history of lung disease  Interim history: Doing well with regard to his breathing.  Continues on breztri inhaler He is finished pulmonary rehab earlier in 2021 and is maintaining an active lifestyle with golfing  I have reviewed his follow-up visit with Dr. Julien Nordmann oncology and Dr. Roxy Horseman, cardiothoracic surgery.  Reviewed recent CT imaging which does not show cancer recurrence  Outpatient Encounter Medications as of 10/20/2021  Medication Sig   albuterol (VENTOLIN HFA) 108 (90 Base) MCG/ACT inhaler    aspirin EC 81 MG tablet Take 81 mg by mouth 3 (three) times a week. In the morning   atorvastatin (LIPITOR) 40 MG tablet TAKE 1 TABLET DAILY   BREZTRI AEROSPHERE 160-9-4.8 MCG/ACT AERO Inhale into the lungs 2 (two) times daily.    carvedilol (COREG) 12.5 MG tablet Take 12.5 mg by  mouth daily.    Coenzyme Q10 (CO Q-10) 100 MG CAPS Take 100 mg by mouth daily.    ezetimibe (ZETIA) 10 MG tablet Take 10 mg by mouth daily.   fish oil-omega-3 fatty acids 1000 MG capsule Take 1,000 mg by mouth daily.    guaiFENesin (MUCINEX) 600 MG 12 hr tablet Take 1 tablet (600 mg total) by mouth 2 (two) times daily as needed for cough or to loosen phlegm.   ipratropium (ATROVENT) 0.03 % nasal spray Place 1 spray into both nostrils daily.    MICARDIS HCT 80-12.5 MG tablet TAKE 1 TABLET DAILY   Multiple Vitamin (MULTIVITAMIN WITH MINERALS) TABS tablet Take 1 tablet by mouth daily.   nitroGLYCERIN (NITROLINGUAL) 0.4 MG/SPRAY spray USE 1 SPRAY ON OR UNDER THE TONGUE EVERY 5 MINUTES FOR 3 DOSES AS NEEDED FOR CHEST PAIN AS DIRECTED BY PRESCRIBER OR PACKAGE INSTRUCTIONS   omeprazole (PRILOSEC) 10 MG capsule Take 10 mg by mouth daily.   No facility-administered encounter medications on file as of 10/20/2021.    Allergies as of 10/20/2021   (No Known Allergies)    Physical Exam: Blood pressure 122/60, pulse 61, temperature 98.1 F (36.7 C), temperature source Oral, height 5' 8.5" (1.74 m), weight 198 lb 3.2 oz (89.9 kg), SpO2 95 %. Gen:      No acute distress HEENT:  EOMI, sclera anicteric Neck:     No masses; no thyromegaly Lungs:    Clear to auscultation bilaterally; normal respiratory effort CV:         Regular rate and rhythm; no murmurs Abd:      +  bowel sounds; soft, non-tender; no palpable masses, no distension Ext:    No edema; adequate peripheral perfusion Skin:      Warm and dry; no rash Neuro: alert and oriented x 3 Psych: normal mood and affect   Data Reviewed: Imaging: CT chest 05/27/2020-s/p right upper lobectomy with loculated fluid, right adrenal adenoma, emphysema.  CT chest 09/01/2021-postsurgical changes in the right upper lobe.  No evidence of disease recurrence.  Stable loculated apical fluid collection.  Right adrenal adenoma. I have reviewed the images  personally.  PFTs: 01/09/2020 FVC 4.11 [97%], FEV1 2.86 [92%], F/F 70, TLC 6.37 [93%], DLCO 18.01 [72%] Mild obstruction, mild diffusion impairment  07/29/2020 FVC 3.23 [7 6%], FEV1 2.17 [70%], F/F 67, TLC 4.36 [63%], DLCO 15.22 [60%] Moderate obstruction with moderate restriction and diffusion defect  Labs: CBC 05/22/2020-WBC 6.1, eos 1%, absolute eosinophil count 62  Assessment:  COPD Lung cancer status post lobectomy Symptoms are stable on breztri He continues to get surveillance CTs with no evidence of recurrence Finished pulmonary rehab in 2021  Plan/Recommendations: Continue breztri, exercise regimen  Marshell Garfinkel MD [facility] 10/20/2021, 10:27 AM  CC: Leanna Battles, MD

## 2021-10-27 ENCOUNTER — Encounter: Payer: Self-pay | Admitting: Physician Assistant

## 2021-10-27 ENCOUNTER — Ambulatory Visit (INDEPENDENT_AMBULATORY_CARE_PROVIDER_SITE_OTHER): Payer: Medicare Other | Admitting: Physician Assistant

## 2021-10-27 VITALS — BP 118/70 | HR 61 | Ht 68.5 in | Wt 195.0 lb

## 2021-10-27 DIAGNOSIS — I2510 Atherosclerotic heart disease of native coronary artery without angina pectoris: Secondary | ICD-10-CM | POA: Diagnosis not present

## 2021-10-27 DIAGNOSIS — R1011 Right upper quadrant pain: Secondary | ICD-10-CM | POA: Diagnosis not present

## 2021-10-27 DIAGNOSIS — G8929 Other chronic pain: Secondary | ICD-10-CM

## 2021-10-27 DIAGNOSIS — K5900 Constipation, unspecified: Secondary | ICD-10-CM | POA: Diagnosis not present

## 2021-10-27 NOTE — Patient Instructions (Signed)
Start Align probiotic daily for 2 months.   Start Miralax 1 capful daily in 8 ounces of liquid. Stop stool softeners.   Try heating pad over RUQ for pain.   Follow up in 2 months.   If you are age 73 or older, your body mass index should be between 23-30. Your Body mass index is 29.22 kg/m. If this is out of the aforementioned range listed, please consider follow up with your Primary Care Provider.  If you are age 70 or younger, your body mass index should be between 19-25. Your Body mass index is 29.22 kg/m. If this is out of the aformentioned range listed, please consider follow up with your Primary Care Provider.   ________________________________________________________  The [facility] providers would like to encourage you to use [facility] to communicate with providers for non-urgent requests or questions.  Due to long hold times on the telephone, sending your provider a message by [facility] may be a faster and more efficient way to get a response.  Please allow 48 business hours for a response.  Please remember that this is for non-urgent requests.  _______________________________________________________

## 2021-10-27 NOTE — Progress Notes (Signed)
Chief Complaint: Constipation and abdominal pain  HPI:    Daniel Colon is a 75 year old male, known to Dr. Hilarie Fredrickson, with a past medical history of non-small cell lung cancer, CAD status post stent on aspirin (11/11/2020 echo with normal LVEF 60-65%), GERD and others listed below including Barrett's esophagus, who was referred to me by Leanna Battles, MD for a complaint of constipation and abdominal pain.    12/04/2019 colonoscopy for heme positive stool with 2 to 3-5 mm polyps in the ascending colon, one 3 mm polyp at the hepatic flexure and diverticulosis in the sigmoid and descending colon as well as internal hemorrhoids.  Pathology showed adenomatous polyp.  Repeat recommended in 5 years.    12/04/2019 EGD for surveillance of Barrett's with esophageal mucosal changes consistent with short segment Barrett's, 1 cm hiatal hernia and Billroth II gastrojejunostomy.  Pathology showed Barrett's.  Repeat recommended in 3 years.    09/01/2021 CT of the chest with contrast for staging of non-small cell lung cancer.  Showed stable posttreatment changes in the right upper lobe following partial lung resection.  Otherwise stable.  CBC with a hemoglobin of 12.    Today, the patient tells me that ever since he had his lobectomy in February, about a month later he started with constipation telling me that he used to go like clockwork between 6:30 and 7:30 in the morning and now he could go a week if he did not take any medicine.  If he does use medicine it is typically a Dulcolax or generic Dulcolax once a day and then MiraLAX once a week.  With the assistance of this is able to have a bowel movement every 3 days or so.  Tells me along with this he has developed a right upper quadrant pain which is almost constantly there, "may be some better after a bowel movement", but this is even with just a light palpation which he feels while sitting.  He has mentioned it to his doctors since the surgery and they told him it was  nerve damage.    Denies fever, chills, weight loss, blood in his stool or symptoms that awaken him from sleep.  Past Medical History:  Diagnosis Date   Allergy    Arthritis    Barrett's esophagus    CAD (coronary artery disease)    a. Stent to the [place] (in [place]);  b. ETT(11/13/13): abnormal with early ST changes to suggest ischemia;   c. LHC (11/17/13):  pLAD 30-40, D1 30, mLAD 70-75, mCFX 70, mRCA stent 40 (ISR), EF 55-65%.  Medical Rx   Cataract    left eye removed, right immature   Colon polyps    Dyslipidemia    GERD (gastroesophageal reflux disease)    Hypertension    Myocardial infarction [facility]) 1996   Nephrolithiasis    kidney stone   NSCL Ca dx'd 12/2019   PUD (peptic ulcer disease)     Past Surgical History:  Procedure Laterality Date   ANGIOPLASTY     stent - rt cor. art   BILROTH II PROCEDURE     COLONOSCOPY     INTERCOSTAL NERVE BLOCK Right 01/24/2020   Procedure: Intercostal Nerve Block;  Surgeon: Grace Isaac, MD;  Location: [place];  Service: Thoracic;  Laterality: Right;   LEFT HEART CATHETERIZATION WITH CORONARY ANGIOGRAM N/A 11/17/2013   Procedure: LEFT HEART CATHETERIZATION WITH CORONARY ANGIOGRAM;  Surgeon: Blane Ohara, MD;  Location: [facility] CATH LAB;  Service: Cardiovascular;  Laterality:  N/A;   LYMPH NODE DISSECTION N/A 01/24/2020   Procedure: LYMPH NODE DISSECTION;  Surgeon: Grace Isaac, MD;  Location: [facility] OR;  Service: Thoracic;  Laterality: N/A;   POLYPECTOMY     ulcer surgery     ULNAR NERVE TRANSPOSITION Right 08/05/2017   Procedure: RIGHT ULNAR NERVE DECOMPRESSION;  Surgeon: Leanora Cover, MD;  Location: [place];  Service: Orthopedics;  Laterality: Right;   VIDEO BRONCHOSCOPY N/A 01/24/2020   Procedure: VIDEO BRONCHOSCOPY;  Surgeon: Grace Isaac, MD;  Location: [facility] OR;  Service: Thoracic;  Laterality: N/A;    Current Outpatient Medications  Medication Sig Dispense Refill   albuterol (VENTOLIN HFA) 108 (90 Base)  MCG/ACT inhaler Inhale 2 puffs into the lungs 2 (two) times daily.     aspirin EC 81 MG tablet Take 81 mg by mouth 3 (three) times a week. In the morning     atorvastatin (LIPITOR) 40 MG tablet TAKE 1 TABLET DAILY 90 tablet 2   BREZTRI AEROSPHERE 160-9-4.8 MCG/ACT AERO Inhale into the lungs daily.     carvedilol (COREG) 12.5 MG tablet Take 12.5 mg by mouth daily.      Coenzyme Q10 (CO Q-10) 100 MG CAPS Take 100 mg by mouth daily.      ezetimibe (ZETIA) 10 MG tablet Take 10 mg by mouth daily.     fish oil-omega-3 fatty acids 1000 MG capsule Take 1,000 mg by mouth daily.      guaiFENesin (MUCINEX) 600 MG 12 hr tablet Take 1 tablet (600 mg total) by mouth 2 (two) times daily as needed for cough or to loosen phlegm.     ipratropium (ATROVENT) 0.03 % nasal spray Place 1 spray into both nostrils daily.      MICARDIS HCT 80-12.5 MG tablet TAKE 1 TABLET DAILY 90 tablet 3   Multiple Vitamin (MULTIVITAMIN WITH MINERALS) TABS tablet Take 1 tablet by mouth daily.     nitroGLYCERIN (NITROLINGUAL) 0.4 MG/SPRAY spray USE 1 SPRAY ON OR UNDER THE TONGUE EVERY 5 MINUTES FOR 3 DOSES AS NEEDED FOR CHEST PAIN AS DIRECTED BY PRESCRIBER OR PACKAGE INSTRUCTIONS 24 g 3   omeprazole (PRILOSEC) 10 MG capsule Take 10 mg by mouth daily.     No current facility-administered medications for this visit.    Allergies as of 10/27/2021   (No Known Allergies)    Family History  Problem Relation Age of Onset   Sudden death Mother 36   CAD Brother 109       CABG   CAD Sister 73       CABG   CAD Sister 56       CABG   Cancer Brother    Liver disease Other    Colon cancer Neg Hx    Colon polyps Neg Hx    Esophageal cancer Neg Hx    Rectal cancer Neg Hx    Stomach cancer Neg Hx     Social History   Socioeconomic History   Marital status: Married    Spouse name: Not on file   Number of children: 1   Years of education: Not on file   Highest education level: Not on file  Occupational History   Not on file   Tobacco Use   Smoking status: Former    Packs/day: 1.00    Years: 42.00    Pack years: 42.00    Types: Cigarettes    Quit date: 06/21/2011    Years since quitting: 10.3   Smokeless tobacco:  Former    Types: Nurse, children's Use: Never used  Substance and Sexual Activity   Alcohol use: Yes    Alcohol/week: 0.0 standard drinks    Comment: social   Drug use: No   Sexual activity: Not on file  Other Topics Concern   Not on file  Social History Narrative   Lives with wife and mother in law.    Social Determinants of Health   Financial Resource Strain: Not on file  Food Insecurity: Not on file  Transportation Needs: Not on file  Physical Activity: Not on file  Stress: Not on file  Social Connections: Not on file  Intimate Partner Violence: Not on file    Review of Systems:    Constitutional: No weight loss, fever or chills Cardiovascular: No chest pain Respiratory: No SOB Gastrointestinal: See HPI and otherwise negative   Physical Exam:  Vital signs: BP 118/70   Pulse 61   Ht 5' 8.5" (1.74 m)   Wt 195 lb (88.5 kg)   BMI 29.22 kg/m   Constitutional:   Pleasant Caucasian male appears to be in NAD, Well developed, Well nourished, alert and cooperative Respiratory: Respirations even and unlabored. Lungs clear to auscultation bilaterally.   No wheezes, crackles, or rhonchi.  Cardiovascular: Normal S1, S2. No MRG. Regular rate and rhythm. No peripheral edema, cyanosis or pallor.  Gastrointestinal:  Soft, nondistended, mild TTP to light palpation in the right upper quadrant when patient is sitting, but not laying down, no rebound or guarding. Normal bowel sounds. No appreciable masses or hepatomegaly.+ Midline scar, also trocar scars in the right upper quadrant Rectal:  Not performed.  Psychiatric: Demonstrates good judgement and reason without abnormal affect or behaviors.  RELEVANT LABS AND IMAGING: CBC    Component Value Date/Time   WBC 5.7 09/01/2021 0732    WBC 6.6 01/29/2020 0428   RBC 4.34 09/01/2021 0732   HGB 12.0 (L) 09/01/2021 0732   HCT 36.4 (L) 09/01/2021 0732   PLT 171 09/01/2021 0732   MCV 83.9 09/01/2021 0732   MCH 27.6 09/01/2021 0732   MCHC 33.0 09/01/2021 0732   RDW 14.4 09/01/2021 0732   LYMPHSABS 1.3 09/01/2021 0732   MONOABS 0.4 09/01/2021 0732   EOSABS 0.1 09/01/2021 0732   BASOSABS 0.0 09/01/2021 0732    CMP     Component Value Date/Time   NA 138 09/01/2021 0732   K 4.0 09/01/2021 0732   CL 107 09/01/2021 0732   CO2 23 09/01/2021 0732   GLUCOSE 101 (H) 09/01/2021 0732   BUN 16 09/01/2021 0732   CREATININE 0.88 09/01/2021 0732   CALCIUM 8.7 (L) 09/01/2021 0732   PROT 6.0 (L) 09/01/2021 0732   ALBUMIN 3.7 09/01/2021 0732   AST 18 09/01/2021 0732   ALT 23 09/01/2021 0732   ALKPHOS 60 09/01/2021 0732   BILITOT 0.5 09/01/2021 0732   GFRNONAA >60 09/01/2021 0732   GFRAA >60 08/28/2020 0828    Assessment: 1.  Constipation: Changed over the past year and a half ever since his lobectomy, is helped by stool softeners and MiraLAX; consider anesthesia effects/possible adhesions altering the course of his colon 2.  Right upper quadrant pain: His surgeons were likely right and this is likely nerve pain given how it feels on exam  3.  History of Barrett's esophagus: Repeat EGD due December 2023 4.  History of adenomatous polyps: Repeat colonoscopy due December 2025  Plan: 1.  Recommend the patient start an over-the-counter probiotic  such as Align once daily for the next 2 months.  If not helping then he can discontinue. 2.  Recommend the patient discontinue his stool softeners and just use the MiraLAX once a day.  Titrated to a soft solid bowel movement more frequently. 3.  Patient to follow in clinic with me in 2 months or sooner if necessary.  Ellouise Newer, PA-C [facility] 10/27/2021, 3:30 PM  Cc: Leanna Battles, MD

## 2021-11-03 NOTE — Progress Notes (Signed)
Addendum: Reviewed and agree with assessment and management plan. Shean Gerding M, MD  

## 2021-11-17 DIAGNOSIS — E785 Hyperlipidemia, unspecified: Secondary | ICD-10-CM | POA: Diagnosis not present

## 2021-11-17 DIAGNOSIS — E291 Testicular hypofunction: Secondary | ICD-10-CM | POA: Diagnosis not present

## 2021-11-17 DIAGNOSIS — Z125 Encounter for screening for malignant neoplasm of prostate: Secondary | ICD-10-CM | POA: Diagnosis not present

## 2021-11-17 DIAGNOSIS — I10 Essential (primary) hypertension: Secondary | ICD-10-CM | POA: Diagnosis not present

## 2021-11-24 DIAGNOSIS — Z9861 Coronary angioplasty status: Secondary | ICD-10-CM | POA: Diagnosis not present

## 2021-11-24 DIAGNOSIS — I10 Essential (primary) hypertension: Secondary | ICD-10-CM | POA: Diagnosis not present

## 2021-11-24 DIAGNOSIS — I700 Atherosclerosis of aorta: Secondary | ICD-10-CM | POA: Diagnosis not present

## 2021-11-24 DIAGNOSIS — K2270 Barrett's esophagus without dysplasia: Secondary | ICD-10-CM | POA: Diagnosis not present

## 2021-11-24 DIAGNOSIS — Z0000 Encounter for general adult medical examination without abnormal findings: Secondary | ICD-10-CM | POA: Diagnosis not present

## 2021-11-24 DIAGNOSIS — E291 Testicular hypofunction: Secondary | ICD-10-CM | POA: Diagnosis not present

## 2021-11-24 DIAGNOSIS — Z85118 Personal history of other malignant neoplasm of bronchus and lung: Secondary | ICD-10-CM | POA: Diagnosis not present

## 2021-11-24 DIAGNOSIS — N5201 Erectile dysfunction due to arterial insufficiency: Secondary | ICD-10-CM | POA: Diagnosis not present

## 2021-11-24 DIAGNOSIS — J439 Emphysema, unspecified: Secondary | ICD-10-CM | POA: Diagnosis not present

## 2021-11-24 DIAGNOSIS — E785 Hyperlipidemia, unspecified: Secondary | ICD-10-CM | POA: Diagnosis not present

## 2021-11-24 DIAGNOSIS — R82998 Other abnormal findings in urine: Secondary | ICD-10-CM | POA: Diagnosis not present

## 2022-01-25 DIAGNOSIS — I2510 Atherosclerotic heart disease of native coronary artery without angina pectoris: Secondary | ICD-10-CM | POA: Insufficient documentation

## 2022-01-25 NOTE — Progress Notes (Signed)
Cardiology Office Note   Date:  01/26/2022   ID:  Avett, Reineck 04/03/1946, MRN [id number]  PCP:  Donnajean Lopes, MD  Cardiologist:   None   Chief Complaint  Patient presents with   Coronary Artery Disease      History of Present Illness: Daniel Colon is a 76 y.o. male who presents for follow up of  CAD, s/p stent to RCA in 1996 in [place].  Routine ETT(11/13/13) was abnormal with early ST changes to suggest ischemia. He was set up had LHC (11/17/13):  pLAD 30-40, D1 30, mLAD 70-75, mCFX 70, mRCA stent 40 (ISR), EF 55-65%.   He was managed medically.  He returns for follow up. He had lung right upper lobe resection.  He was treated with chemotherapy.    He has had some chronic dyspnea since his surgery.  He thinks this is unchanged.  He plays 18 holes of golf.  He goes to the gym 3 times per week.  At the gym he will be on the bike for 10 minutes.  He has not checked his pulse ox when he short of breath and exercising but he says when he is resting is 98%.  He has some chest discomfort at the right thoracotomy scar and also some abdominal discomfort but this is unchanged.  His shortness of breath is not changed since he had his echo done in 2021 which demonstrated normal left ventricular function and no significant valvular abnormalities.  Past Medical History:  Diagnosis Date   Allergy    Arthritis    Barrett's esophagus    CAD (coronary artery disease)    a. Stent to the [place] (in [place]);  b. ETT(11/13/13): abnormal with early ST changes to suggest ischemia;   c. LHC (11/17/13):  pLAD 30-40, D1 30, mLAD 70-75, mCFX 70, mRCA stent 40 (ISR), EF 55-65%.  Medical Rx   Cataract    left eye removed, right immature   Colon polyps    Dyslipidemia    GERD (gastroesophageal reflux disease)    Hypertension    Myocardial infarction [facility]) 1996   Nephrolithiasis    kidney stone   NSCL Ca dx'd 12/2019   PUD (peptic ulcer disease)     Past Surgical History:  Procedure  Laterality Date   ANGIOPLASTY     stent - rt cor. art   BILROTH II PROCEDURE     COLONOSCOPY     INTERCOSTAL NERVE BLOCK Right 01/24/2020   Procedure: Intercostal Nerve Block;  Surgeon: Grace Isaac, MD;  Location: [place];  Service: Thoracic;  Laterality: Right;   LEFT HEART CATHETERIZATION WITH CORONARY ANGIOGRAM N/A 11/17/2013   Procedure: LEFT HEART CATHETERIZATION WITH CORONARY ANGIOGRAM;  Surgeon: Blane Ohara, MD;  Location: [facility] CATH LAB;  Service: Cardiovascular;  Laterality: N/A;   LUNG LOBECTOMY  01/2020   had chemo, no radtion   LYMPH NODE DISSECTION N/A 01/24/2020   Procedure: LYMPH NODE DISSECTION;  Surgeon: Grace Isaac, MD;  Location: [facility] OR;  Service: Thoracic;  Laterality: N/A;   POLYPECTOMY     ulcer surgery     ULNAR NERVE TRANSPOSITION Right 08/05/2017   Procedure: RIGHT ULNAR NERVE DECOMPRESSION;  Surgeon: Leanora Cover, MD;  Location: [place];  Service: Orthopedics;  Laterality: Right;   VIDEO BRONCHOSCOPY N/A 01/24/2020   Procedure: VIDEO BRONCHOSCOPY;  Surgeon: Grace Isaac, MD;  Location: [place];  Service: Thoracic;  Laterality: N/A;  Current Outpatient Medications  Medication Sig Dispense Refill   albuterol (VENTOLIN HFA) 108 (90 Base) MCG/ACT inhaler Inhale 2 puffs into the lungs 2 (two) times daily.     aspirin EC 81 MG tablet Take 81 mg by mouth 3 (three) times a week. In the morning     atorvastatin (LIPITOR) 40 MG tablet TAKE 1 TABLET DAILY 90 tablet 2   BREZTRI AEROSPHERE 160-9-4.8 MCG/ACT AERO Inhale into the lungs daily.     carvedilol (COREG) 12.5 MG tablet Take 12.5 mg by mouth daily.      Coenzyme Q10 (CO Q-10) 100 MG CAPS Take 100 mg by mouth daily.      ezetimibe (ZETIA) 10 MG tablet Take 10 mg by mouth daily.     fish oil-omega-3 fatty acids 1000 MG capsule Take 1,000 mg by mouth daily.      guaiFENesin (MUCINEX) 600 MG 12 hr tablet Take 1 tablet (600 mg total) by mouth 2 (two) times daily as needed for cough  or to loosen phlegm.     ipratropium (ATROVENT) 0.03 % nasal spray Place 1 spray into both nostrils daily.      MICARDIS HCT 80-12.5 MG tablet TAKE 1 TABLET DAILY 90 tablet 3   Multiple Vitamin (MULTIVITAMIN WITH MINERALS) TABS tablet Take 1 tablet by mouth daily.     nitroGLYCERIN (NITROSTAT) 0.4 MG SL tablet Place 1 tablet (0.4 mg total) under the tongue every 5 (five) minutes as needed for chest pain. 25 tablet 5   omeprazole (PRILOSEC) 10 MG capsule Take 10 mg by mouth daily.     sildenafil (VIAGRA) 100 MG tablet SMARTSIG:Tablet(s) By Mouth     No current facility-administered medications for this visit.    Allergies:   Patient has no known allergies.    ROS:  Please see the history of present illness.   Otherwise, review of systems are positive for none.   All other systems are reviewed and negative.    PHYSICAL EXAM: VS:  BP (!) 101/58    Pulse (!) 53    Ht 5' 8.5" (1.74 m)    Wt 193 lb (87.5 kg)    SpO2 98%    BMI 28.92 kg/m  , BMI Body mass index is 28.92 kg/m. GENERAL:  Well appearing NECK:  No jugular venous distention, waveform within normal limits, carotid upstroke brisk and symmetric, no bruits, no thyromegaly LUNGS:  Clear to auscultation bilaterally CHEST: Well-healed thoracotomy scar HEART:  PMI not displaced or sustained,S1 and S2 within normal limits, no S3, no S4, no clicks, no rubs, no murmurs ABD:  Flat, positive bowel sounds normal in frequency in pitch, no bruits, no rebound, no guarding, no midline pulsatile mass, no hepatomegaly, no splenomegaly EXT:  2 plus pulses throughout, no edema, no cyanosis no clubbing   EKG:  EKG is  ordered today. The ekg ordered today demonstrates sinus rhythm, rate 53, axis within normal limits, intervals within normal limits, inferolateral ST depression noted on previous EKGs   Recent Labs: 09/01/2021: ALT 23; BUN 16; Creatinine 0.88; Hemoglobin 12.0; Platelet Count 171; Potassium 4.0; Sodium 138    Lipid Panel No results  found for: CHOL, TRIG, HDL, CHOLHDL, VLDL, LDLCALC, LDLDIRECT    Wt Readings from Last 3 Encounters:  01/26/22 193 lb (87.5 kg)  10/27/21 195 lb (88.5 kg)  10/20/21 198 lb 3.2 oz (89.9 kg)      Other studies Reviewed: Additional studies/ records that were reviewed today include: Labs . Review of the above  records demonstrates:  Please see elsewhere in the note.     ASSESSMENT AND PLAN:  CAD:   The patient has no new anginal symptoms.  He is participating in risk reduction.  No change in therapy.   DYSLIPIDEMIA: LDL was  was 55 with an HDL of 45.  No change in therapy.   HTN: Blood pressure is at target.  No change in therapy.   SOB:    This is chronic.  It is unchanged from previous.  It is likely related to his chronic lung disease and noncardiac.  No further work-up.  Current medicines are reviewed at length with the patient today.  The patient does not have concerns regarding medicines.  The following changes have been made: None  Labs/ tests ordered today include: None  Orders Placed This Encounter  Procedures   EKG 12-Lead     Disposition:   FU with me in 12 months.     Signed, Daniel Breeding, MD  01/26/2022 11:42 AM    [facility]

## 2022-01-26 ENCOUNTER — Other Ambulatory Visit: Payer: Self-pay

## 2022-01-26 ENCOUNTER — Encounter: Payer: Self-pay | Admitting: Cardiology

## 2022-01-26 ENCOUNTER — Ambulatory Visit (INDEPENDENT_AMBULATORY_CARE_PROVIDER_SITE_OTHER): Payer: Medicare Other | Admitting: Cardiology

## 2022-01-26 VITALS — BP 101/58 | HR 53 | Ht 68.5 in | Wt 193.0 lb

## 2022-01-26 DIAGNOSIS — I10 Essential (primary) hypertension: Secondary | ICD-10-CM

## 2022-01-26 DIAGNOSIS — E785 Hyperlipidemia, unspecified: Secondary | ICD-10-CM

## 2022-01-26 DIAGNOSIS — I2510 Atherosclerotic heart disease of native coronary artery without angina pectoris: Secondary | ICD-10-CM | POA: Diagnosis not present

## 2022-01-26 DIAGNOSIS — R0602 Shortness of breath: Secondary | ICD-10-CM | POA: Diagnosis not present

## 2022-01-26 MED ORDER — NITROGLYCERIN 0.4 MG SL SUBL: 0.4000 mg | SUBLINGUAL_TABLET | SUBLINGUAL | 5 refills | Status: DC | PRN

## 2022-01-26 NOTE — Patient Instructions (Signed)

## 2022-02-09 ENCOUNTER — Other Ambulatory Visit: Payer: Self-pay | Admitting: Cardiology

## 2022-03-02 ENCOUNTER — Inpatient Hospital Stay: Payer: Medicare Other | Attending: Internal Medicine

## 2022-03-02 ENCOUNTER — Encounter (HOSPITAL_COMMUNITY): Payer: Self-pay

## 2022-03-02 ENCOUNTER — Other Ambulatory Visit: Payer: Self-pay

## 2022-03-02 ENCOUNTER — Ambulatory Visit (HOSPITAL_COMMUNITY)
Admission: RE | Admit: 2022-03-02 | Discharge: 2022-03-02 | Disposition: A | Discharge status: Home / Self Care | Payer: Medicare Other | Source: Ambulatory Visit | Attending: Internal Medicine | Admitting: Internal Medicine

## 2022-03-02 DIAGNOSIS — I252 Old myocardial infarction: Secondary | ICD-10-CM | POA: Insufficient documentation

## 2022-03-02 DIAGNOSIS — Z7982 Long term (current) use of aspirin: Secondary | ICD-10-CM | POA: Insufficient documentation

## 2022-03-02 DIAGNOSIS — C3490 Malignant neoplasm of unspecified part of unspecified bronchus or lung: Secondary | ICD-10-CM | POA: Insufficient documentation

## 2022-03-02 DIAGNOSIS — Z9221 Personal history of antineoplastic chemotherapy: Secondary | ICD-10-CM | POA: Insufficient documentation

## 2022-03-02 DIAGNOSIS — Z79899 Other long term (current) drug therapy: Secondary | ICD-10-CM | POA: Insufficient documentation

## 2022-03-02 DIAGNOSIS — I10 Essential (primary) hypertension: Secondary | ICD-10-CM | POA: Insufficient documentation

## 2022-03-02 DIAGNOSIS — Z85118 Personal history of other malignant neoplasm of bronchus and lung: Secondary | ICD-10-CM | POA: Insufficient documentation

## 2022-03-02 DIAGNOSIS — Z902 Acquired absence of lung [part of]: Secondary | ICD-10-CM | POA: Insufficient documentation

## 2022-03-02 LAB — CBC WITH DIFFERENTIAL (CANCER CENTER ONLY)
Abs Immature Granulocytes: 0.02 10*3/uL (ref 0.00–0.07)
Basophils Absolute: 0 10*3/uL (ref 0.0–0.1)
Basophils Relative: 0 %
Eosinophils Absolute: 0.1 10*3/uL (ref 0.0–0.5)
Eosinophils Relative: 2 %
HCT: 37.6 % — ABNORMAL LOW (ref 39.0–52.0)
Hemoglobin: 12.8 g/dL — ABNORMAL LOW (ref 13.0–17.0)
Immature Granulocytes: 0 %
Lymphocytes Relative: 21 %
Lymphs Abs: 1.4 10*3/uL (ref 0.7–4.0)
MCH: 27.8 pg (ref 26.0–34.0)
MCHC: 34 g/dL (ref 30.0–36.0)
MCV: 81.7 fL (ref 80.0–100.0)
Monocytes Absolute: 0.5 10*3/uL (ref 0.1–1.0)
Monocytes Relative: 8 %
Neutro Abs: 4.4 10*3/uL (ref 1.7–7.7)
Neutrophils Relative %: 69 %
Platelet Count: 157 10*3/uL (ref 150–400)
RBC: 4.6 MIL/uL (ref 4.22–5.81)
RDW: 14.7 % (ref 11.5–15.5)
WBC Count: 6.5 10*3/uL (ref 4.0–10.5)
nRBC: 0 % (ref 0.0–0.2)

## 2022-03-02 LAB — CMP (CANCER CENTER ONLY)
ALT: 23 U/L (ref 0–44)
AST: 15 U/L (ref 15–41)
Albumin: 4.1 g/dL (ref 3.5–5.0)
Alkaline Phosphatase: 55 U/L (ref 38–126)
Anion gap: 5 (ref 5–15)
BUN: 18 mg/dL (ref 8–23)
CO2: 26 mmol/L (ref 22–32)
Calcium: 9 mg/dL (ref 8.9–10.3)
Chloride: 107 mmol/L (ref 98–111)
Creatinine: 0.79 mg/dL (ref 0.61–1.24)
GFR, Estimated: >60 mL/min (ref >60)
Glucose, Bld: 112 mg/dL — ABNORMAL HIGH (ref 70–99)
Potassium: 4.1 mmol/L (ref 3.5–5.1)
Sodium: 138 mmol/L (ref 135–145)
Total Bilirubin: 0.6 mg/dL (ref 0.3–1.2)
Total Protein: 6 g/dL — ABNORMAL LOW (ref 6.5–8.1)

## 2022-03-02 MED ORDER — SODIUM CHLORIDE (PF) 0.9 % IJ SOLN
INTRAMUSCULAR | Status: AC
  Filled 2022-03-02: qty 50

## 2022-03-02 MED ORDER — IOHEXOL 300 MG/ML  SOLN
75.0000 mL | Freq: Once | INTRAMUSCULAR | Status: AC | PRN
  Administered 2022-03-02: 75 mL via INTRAVENOUS

## 2022-03-04 ENCOUNTER — Encounter: Payer: Self-pay | Admitting: Internal Medicine

## 2022-03-04 ENCOUNTER — Inpatient Hospital Stay (HOSPITAL_BASED_OUTPATIENT_CLINIC_OR_DEPARTMENT_OTHER): Payer: Medicare Other | Admitting: Internal Medicine

## 2022-03-04 ENCOUNTER — Other Ambulatory Visit: Payer: Self-pay

## 2022-03-04 VITALS — BP 150/62 | HR 51 | Temp 97.7°F | Resp 18 | Wt 196.2 lb

## 2022-03-04 DIAGNOSIS — Z902 Acquired absence of lung [part of]: Secondary | ICD-10-CM | POA: Diagnosis not present

## 2022-03-04 DIAGNOSIS — Z85118 Personal history of other malignant neoplasm of bronchus and lung: Secondary | ICD-10-CM | POA: Diagnosis not present

## 2022-03-04 DIAGNOSIS — I10 Essential (primary) hypertension: Secondary | ICD-10-CM | POA: Diagnosis not present

## 2022-03-04 DIAGNOSIS — Z9221 Personal history of antineoplastic chemotherapy: Secondary | ICD-10-CM | POA: Diagnosis not present

## 2022-03-04 DIAGNOSIS — C3490 Malignant neoplasm of unspecified part of unspecified bronchus or lung: Secondary | ICD-10-CM

## 2022-03-04 DIAGNOSIS — Z7982 Long term (current) use of aspirin: Secondary | ICD-10-CM | POA: Diagnosis not present

## 2022-03-04 DIAGNOSIS — I252 Old myocardial infarction: Secondary | ICD-10-CM | POA: Diagnosis not present

## 2022-03-04 DIAGNOSIS — Z79899 Other long term (current) drug therapy: Secondary | ICD-10-CM | POA: Diagnosis not present

## 2022-03-04 NOTE — Progress Notes (Signed)
?    [place] ?Telephone:(336) [phone]   [phone] ? ?OFFICE PROGRESS NOTE ? ?Donnajean Lopes, MD ?[address] ?[place] [address] ? 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 ?PRIOR THERAPY:   ?1) Status post right upper lobectomy with lymph node dissection under the care of Dr. Servando Snare. ?2) Adjuvant systemic chemotherapy with carboplatin for AUC of 6 and paclitaxel 200 mg/M2 every 3 weeks with Neulasta support.  First dose February 28, 2020.  Status post 4 cycles.  Last dose was given May 01, 2020. ? ?CURRENT THERAPY: Observation. ? ?INTERVAL HISTORY: ?Daniel Colon 76 y.o. male returns to the clinic today for 42-month follow-up visit accompanied by his wife.  The patient is feeling fine today with no concerning complaints except for intermittent constipation.  He denied having any current chest pain, shortness of breath, cough or hemoptysis.  He has no nausea, vomiting, abdominal pain or diarrhea.  He has no recent weight loss or night sweats.  He has no headache or visual changes.  He had repeat CT scan of the chest performed recently and he is here for evaluation and discussion of his scan results. ? ? ?MEDICAL HISTORY: ?Past Medical History:  ?Diagnosis Date  ? Allergy   ? Arthritis   ? Barrett's esophagus   ? CAD (coronary artery disease)   ? a. Stent to the [facility] (in [place]);  b. ETT(11/13/13): abnormal with early ST changes to suggest ischemia;   c. LHC (11/17/13):  pLAD 30-40, D1 30, mLAD 70-75, mCFX 70, mRCA stent 40 (ISR), EF 55-65%.  Medical Rx  ? Cataract   ? left eye removed, right immature  ? Colon polyps   ? Dyslipidemia   ? GERD (gastroesophageal reflux disease)   ? Hypertension   ? Myocardial infarction [facility]) 1996  ? Nephrolithiasis   ? kidney stone  ? NSCL Ca dx'd 12/2019  ? PUD (peptic ulcer disease)   ? ? ?ALLERGIES:  has No Known Allergies. ? ?MEDICATIONS:  ?Current Outpatient  Medications  ?Medication Sig Dispense Refill  ? albuterol (VENTOLIN HFA) 108 (90 Base) MCG/ACT inhaler Inhale 2 puffs into the lungs 2 (two) times daily.    ? aspirin EC 81 MG tablet Take 81 mg by mouth 3 (three) times a week. In the morning    ? atorvastatin (LIPITOR) 40 MG tablet TAKE 1 TABLET DAILY 90 tablet 3  ? BREZTRI AEROSPHERE 160-9-4.8 MCG/ACT AERO Inhale into the lungs daily.    ? carvedilol (COREG) 12.5 MG tablet Take 12.5 mg by mouth daily.     ? Coenzyme Q10 (CO Q-10) 100 MG CAPS Take 100 mg by mouth daily.     ? ezetimibe (ZETIA) 10 MG tablet Take 10 mg by mouth daily.    ? fish oil-omega-3 fatty acids 1000 MG capsule Take 1,000 mg by mouth daily.     ? guaiFENesin (MUCINEX) 600 MG 12 hr tablet Take 1 tablet (600 mg total) by mouth 2 (two) times daily as needed for cough or to loosen phlegm.    ? ipratropium (ATROVENT) 0.03 % nasal spray Place 1 spray into both nostrils daily.     ? MICARDIS HCT 80-12.5 MG tablet TAKE 1 TABLET DAILY 90 tablet 3  ? Multiple Vitamin (MULTIVITAMIN WITH MINERALS) TABS tablet Take 1 tablet by mouth daily.    ? nitroGLYCERIN (NITROSTAT) 0.4 MG SL tablet Place 1 tablet (0.4 mg total) under  the tongue every 5 (five) minutes as needed for chest pain. 25 tablet 5  ? omeprazole (PRILOSEC) 10 MG capsule Take 10 mg by mouth daily.    ? sildenafil (VIAGRA) 100 MG tablet SMARTSIG:Tablet(s) By Mouth    ? ?No current facility-administered medications for this visit.  ? ? ?SURGICAL HISTORY:  ?Past Surgical History:  ?Procedure Laterality Date  ? ANGIOPLASTY    ? stent - rt cor. art  ? BILROTH II PROCEDURE    ? COLONOSCOPY    ? INTERCOSTAL NERVE BLOCK Right 01/24/2020  ? Procedure: Intercostal Nerve Block;  Surgeon: Grace Isaac, MD;  Location: [place];  Service: Thoracic;  Laterality: Right;  ? LEFT HEART CATHETERIZATION WITH CORONARY ANGIOGRAM N/A 11/17/2013  ? Procedure: LEFT HEART CATHETERIZATION WITH CORONARY ANGIOGRAM;  Surgeon: Blane Ohara, MD;  Location: [facility] CATH LAB;   Service: Cardiovascular;  Laterality: N/A;  ? LUNG LOBECTOMY  01/2020  ? had chemo, no radtion  ? LYMPH NODE DISSECTION N/A 01/24/2020  ? Procedure: LYMPH NODE DISSECTION;  Surgeon: Grace Isaac, MD;  Location: [facility] OR;  Service: Thoracic;  Laterality: N/A;  ? POLYPECTOMY    ? ulcer surgery    ? ULNAR NERVE TRANSPOSITION Right 08/05/2017  ? Procedure: RIGHT ULNAR NERVE DECOMPRESSION;  Surgeon: Leanora Cover, MD;  Location: [place];  Service: Orthopedics;  Laterality: Right;  ? VIDEO BRONCHOSCOPY N/A 01/24/2020  ? Procedure: VIDEO BRONCHOSCOPY;  Surgeon: Grace Isaac, MD;  Location: [facility] OR;  Service: Thoracic;  Laterality: N/A;  ? ? ?REVIEW OF SYSTEMS:  A comprehensive review of systems was negative except for: Gastrointestinal: positive for constipation  ? ?PHYSICAL EXAMINATION: General appearance: alert, cooperative, and no distress ?Head: Normocephalic, without obvious abnormality, atraumatic ?Neck: no adenopathy, no JVD, supple, symmetrical, trachea midline, and thyroid not enlarged, symmetric, no tenderness/mass/nodules ?Lymph nodes: Cervical, supraclavicular, and axillary nodes normal. ?Resp: clear to auscultation bilaterally ?Back: symmetric, no curvature. ROM normal. No CVA tenderness. ?Cardio: regular rate and rhythm, S1, S2 normal, no murmur, click, rub or gallop ?GI: soft, non-tender; bowel sounds normal; no masses,  no organomegaly ?Extremities: extremities normal, atraumatic, no cyanosis or edema ? ?ECOG PERFORMANCE STATUS: 1 - Symptomatic but completely ambulatory ? ?Blood pressure (!) 150/62, pulse (!) 51, temperature 97.7 ?F (36.5 ?C), temperature source Oral, resp. rate 18, weight 196 lb 3 oz (89 kg), SpO2 100 %. ? ?LABORATORY DATA: ?Lab Results  ?Component Value Date  ? WBC 6.5 03/02/2022  ? HGB 12.8 (L) 03/02/2022  ? HCT 37.6 (L) 03/02/2022  ? MCV 81.7 03/02/2022  ? PLT 157 03/02/2022  ? ? ?  Chemistry   ?   ?Component Value Date/Time  ? NA 138 03/02/2022 0809  ? K 4.1  03/02/2022 0809  ? CL 107 03/02/2022 0809  ? CO2 26 03/02/2022 0809  ? BUN 18 03/02/2022 0809  ? CREATININE 0.79 03/02/2022 0809  ?    ?Component Value Date/Time  ? CALCIUM 9.0 03/02/2022 0809  ? ALKPHOS 55 03/02/2022 0809  ? AST 15 03/02/2022 0809  ? ALT 23 03/02/2022 0809  ? BILITOT 0.6 03/02/2022 0809  ?  ? ? ? ?RADIOGRAPHIC STUDIES: ?CT Chest W Contrast ? ?Result Date: 03/02/2022 ?CLINICAL DATA:  Non-small cell lung cancer, restaging. EXAM: CT CHEST WITH CONTRAST TECHNIQUE: Multidetector CT imaging of the chest was performed during intravenous contrast administration. RADIATION DOSE REDUCTION: This exam was performed according to the departmental dose-optimization program which includes automated exposure control, adjustment of the mA and/or  kV according to patient size and/or use of iterative reconstruction technique. CONTRAST:  45mL OMNIPAQUE IOHEXOL 300 MG/ML  SOLN COMPARISON:  09/01/2021 FINDINGS: Cardiovascular: Heart size is normal. No pericardial effusion. Aortic atherosclerosis. Three vessel coronary artery calcifications. Mediastinum/Nodes: No enlarged mediastinal, hilar, or axillary lymph nodes. Thyroid gland, trachea, and esophagus demonstrate no significant findings. Lungs/Pleura: Postoperative change from right upper lobectomy. Small loculated pleural fluid collection overlying the apical portion of the right upper lung is stable, image 30/3. Decrease in volume of the gas within this fluid collection compared with the previous exam.a calcified granulomas identified within the left lower lobe. No suspicious nodule or mass identified. Upper Abdomen: No acute abnormality. Right adrenal adenoma is unchanged when compared with the previous exam, image 142/3. Postoperative changes from partial gastrectomy with gastrojejunostomy. Stable small low-density structure within the dome of liver measuring 7 mm. Musculoskeletal: No chest wall abnormality. No acute or significant osseous findings. IMPRESSION: 1.  Stable post treatment changes status post right upper lobectomy. No specific findings to suggest locally recurrent tumor or metastatic disease. 2. Loculated right apical pleural fluid collection is stable in the i

## 2022-03-26 ENCOUNTER — Other Ambulatory Visit: Payer: Self-pay | Admitting: *Deleted

## 2022-03-26 MED ORDER — NITROGLYCERIN 0.4 MG SL SUBL: 0.4000 mg | SUBLINGUAL_TABLET | SUBLINGUAL | 5 refills | Status: DC | PRN

## 2022-04-27 DIAGNOSIS — L080 Pyoderma: Secondary | ICD-10-CM | POA: Diagnosis not present

## 2022-04-27 DIAGNOSIS — D485 Neoplasm of uncertain behavior of skin: Secondary | ICD-10-CM | POA: Diagnosis not present

## 2022-04-27 DIAGNOSIS — L821 Other seborrheic keratosis: Secondary | ICD-10-CM | POA: Diagnosis not present

## 2022-04-27 DIAGNOSIS — L720 Epidermal cyst: Secondary | ICD-10-CM | POA: Diagnosis not present

## 2022-04-27 DIAGNOSIS — L304 Erythema intertrigo: Secondary | ICD-10-CM | POA: Diagnosis not present

## 2022-04-27 DIAGNOSIS — D1801 Hemangioma of skin and subcutaneous tissue: Secondary | ICD-10-CM | POA: Diagnosis not present

## 2022-06-05 NOTE — Patient Outreach (Signed)
Received a referral notification for Mr. Orrego ---. I have assigned Valente David, RN to call for follow up and determine if there are any Case Management needs.    [facility], [place], [place] Management [phone]

## 2022-06-08 ENCOUNTER — Other Ambulatory Visit: Payer: Self-pay | Admitting: *Deleted

## 2022-06-08 NOTE — Patient Outreach (Signed)
[facility]) Care Management  06/08/2022  Ory Elting [birth year] [id number]   Referral Date: 6/16 Referral Source: Insurance Referral Reason: Chronic care needs (HTN, CAD, COPD) Insurance: [employer]   Outreach attempt #1, unsuccessful, HIPAA compliant voice message left.    Plan: RN CM will follow up within the next 2-3 business days.  Valente David, RN, MSN, [place] Manager [phone]

## 2022-06-11 ENCOUNTER — Other Ambulatory Visit: Payer: Self-pay | Admitting: *Deleted

## 2022-06-11 NOTE — Patient Outreach (Signed)
[facility]) Care Management  06/11/2022  Wong Steadham October 31, 1946 [id number]  Referral Date: 6/16 Referral Source: Insurance Referral Reason: Chronic care needs (HTN, CAD, COPD) Insurance: [employer]   Outreach attempt #2, unsuccessful, HIPAA compliant voice message left.   Plan: RN CM will send outreach letter and close case.  Will open case if member calls back with interest in program.  Valente David, RN, MSN, [place] Manager [phone]

## 2022-08-08 ENCOUNTER — Other Ambulatory Visit: Payer: Self-pay | Admitting: Cardiology

## 2022-08-25 DIAGNOSIS — Z23 Encounter for immunization: Secondary | ICD-10-CM | POA: Diagnosis not present

## 2022-09-02 ENCOUNTER — Inpatient Hospital Stay: Payer: Medicare Other

## 2022-09-04 ENCOUNTER — Encounter (HOSPITAL_COMMUNITY): Payer: Self-pay

## 2022-09-04 ENCOUNTER — Inpatient Hospital Stay: Payer: Medicare Other | Attending: Internal Medicine

## 2022-09-04 ENCOUNTER — Ambulatory Visit (HOSPITAL_COMMUNITY)
Admission: RE | Admit: 2022-09-04 | Discharge: 2022-09-04 | Disposition: A | Discharge status: Home / Self Care | Payer: Medicare Other | Source: Ambulatory Visit | Attending: Internal Medicine | Admitting: Internal Medicine

## 2022-09-04 ENCOUNTER — Other Ambulatory Visit: Payer: Self-pay

## 2022-09-04 DIAGNOSIS — Z85118 Personal history of other malignant neoplasm of bronchus and lung: Secondary | ICD-10-CM | POA: Insufficient documentation

## 2022-09-04 DIAGNOSIS — C3490 Malignant neoplasm of unspecified part of unspecified bronchus or lung: Secondary | ICD-10-CM

## 2022-09-04 DIAGNOSIS — Z9221 Personal history of antineoplastic chemotherapy: Secondary | ICD-10-CM | POA: Insufficient documentation

## 2022-09-04 DIAGNOSIS — I700 Atherosclerosis of aorta: Secondary | ICD-10-CM | POA: Diagnosis not present

## 2022-09-04 DIAGNOSIS — M1990 Unspecified osteoarthritis, unspecified site: Secondary | ICD-10-CM | POA: Insufficient documentation

## 2022-09-04 DIAGNOSIS — R062 Wheezing: Secondary | ICD-10-CM | POA: Insufficient documentation

## 2022-09-04 LAB — CBC WITH DIFFERENTIAL (CANCER CENTER ONLY)
Abs Immature Granulocytes: 0.02 10*3/uL (ref 0.00–0.07)
Basophils Absolute: 0 10*3/uL (ref 0.0–0.1)
Basophils Relative: 0 %
Eosinophils Absolute: 0.1 10*3/uL (ref 0.0–0.5)
Eosinophils Relative: 1 %
HCT: 40.2 % (ref 39.0–52.0)
Hemoglobin: 13.4 g/dL (ref 13.0–17.0)
Immature Granulocytes: 0 %
Lymphocytes Relative: 19 %
Lymphs Abs: 1.3 10*3/uL (ref 0.7–4.0)
MCH: 27.7 pg (ref 26.0–34.0)
MCHC: 33.3 g/dL (ref 30.0–36.0)
MCV: 83.1 fL (ref 80.0–100.0)
Monocytes Absolute: 0.5 10*3/uL (ref 0.1–1.0)
Monocytes Relative: 8 %
Neutro Abs: 5 10*3/uL (ref 1.7–7.7)
Neutrophils Relative %: 72 %
Platelet Count: 173 10*3/uL (ref 150–400)
RBC: 4.84 MIL/uL (ref 4.22–5.81)
RDW: 14.1 % (ref 11.5–15.5)
WBC Count: 7 10*3/uL (ref 4.0–10.5)
nRBC: 0 % (ref 0.0–0.2)

## 2022-09-04 LAB — CMP (CANCER CENTER ONLY)
ALT: 36 U/L (ref 0–44)
AST: 22 U/L (ref 15–41)
Albumin: 4.2 g/dL (ref 3.5–5.0)
Alkaline Phosphatase: 54 U/L (ref 38–126)
Anion gap: 5 (ref 5–15)
BUN: 27 mg/dL — ABNORMAL HIGH (ref 8–23)
CO2: 25 mmol/L (ref 22–32)
Calcium: 9 mg/dL (ref 8.9–10.3)
Chloride: 109 mmol/L (ref 98–111)
Creatinine: 0.79 mg/dL (ref 0.61–1.24)
GFR, Estimated: >60 mL/min (ref >60)
Glucose, Bld: 104 mg/dL — ABNORMAL HIGH (ref 70–99)
Potassium: 4.4 mmol/L (ref 3.5–5.1)
Sodium: 139 mmol/L (ref 135–145)
Total Bilirubin: 0.8 mg/dL (ref 0.3–1.2)
Total Protein: 7 g/dL (ref 6.5–8.1)

## 2022-09-04 MED ORDER — SODIUM CHLORIDE (PF) 0.9 % IJ SOLN
INTRAMUSCULAR | Status: AC
  Filled 2022-09-04: qty 50

## 2022-09-04 MED ORDER — IOHEXOL 300 MG/ML  SOLN
75.0000 mL | Freq: Once | INTRAMUSCULAR | Status: AC | PRN
  Administered 2022-09-04: 75 mL via INTRAVENOUS

## 2022-09-07 ENCOUNTER — Encounter: Payer: Self-pay | Admitting: Internal Medicine

## 2022-09-07 ENCOUNTER — Inpatient Hospital Stay (HOSPITAL_BASED_OUTPATIENT_CLINIC_OR_DEPARTMENT_OTHER): Payer: Medicare Other | Admitting: Internal Medicine

## 2022-09-07 VITALS — BP 135/76 | HR 58 | Temp 98.1°F | Resp 15 | Wt 192.9 lb

## 2022-09-07 DIAGNOSIS — Z9221 Personal history of antineoplastic chemotherapy: Secondary | ICD-10-CM | POA: Diagnosis not present

## 2022-09-07 DIAGNOSIS — M1990 Unspecified osteoarthritis, unspecified site: Secondary | ICD-10-CM | POA: Diagnosis not present

## 2022-09-07 DIAGNOSIS — C3490 Malignant neoplasm of unspecified part of unspecified bronchus or lung: Secondary | ICD-10-CM

## 2022-09-07 DIAGNOSIS — C3401 Malignant neoplasm of right main bronchus: Secondary | ICD-10-CM

## 2022-09-07 DIAGNOSIS — Z85118 Personal history of other malignant neoplasm of bronchus and lung: Secondary | ICD-10-CM | POA: Diagnosis not present

## 2022-09-07 DIAGNOSIS — R062 Wheezing: Secondary | ICD-10-CM | POA: Diagnosis not present

## 2022-09-07 NOTE — Progress Notes (Signed)
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 Servando Snare. 2) Adjuvant systemic chemotherapy with carboplatin for AUC of 6 and paclitaxel 200 mg/M2 every 3 weeks with Neulasta support.  First dose February 28, 2020.  Status post 4 cycles.  Last dose was given May 01, 2020.  CURRENT THERAPY: Observation.  INTERVAL HISTORY: Daniel Colon 76 y.o. male returns to the clinic today for 78-month follow-up visit.  The patient was accompanied by his wife.  He is feeling fine today with no concerning complaints except for right flank pain from the previous surgical scar.  He also has osteoarthritis.  He denied having any current shortness of breath, cough or hemoptysis but has wheezing.  He denied having any recent weight loss or night sweats.  He has no headache or visual changes.  He is here today for evaluation with repeat CT scan of the chest for restaging of his disease.  MEDICAL HISTORY: Past Medical History:  Diagnosis Date   Allergy    Arthritis    Barrett's esophagus    CAD (coronary artery disease)    a. Stent to the [facility] (in [place]);  b. ETT(11/13/13): abnormal with early ST changes to suggest ischemia;   c. LHC (11/17/13):  pLAD 30-40, D1 30, mLAD 70-75, mCFX 70, mRCA stent 40 (ISR), EF 55-65%.  Medical Rx   Cataract    left eye removed, right immature   Colon polyps    Dyslipidemia    GERD (gastroesophageal reflux disease)    Hypertension    Myocardial infarction ([place]) 1996   Nephrolithiasis    kidney stone   NSCL Ca dx'd 12/2019   PUD (peptic ulcer disease)     ALLERGIES:  has No Known Allergies.  MEDICATIONS:  Current Outpatient Medications   Medication Sig Dispense Refill   albuterol (VENTOLIN HFA) 108 (90 Base) MCG/ACT inhaler Inhale 2 puffs into the lungs 2 (two) times daily.     aspirin EC 81 MG tablet Take 81 mg by mouth 2 (two) times a week. In the morning     atorvastatin (LIPITOR) 40 MG tablet TAKE 1 TABLET DAILY 90 tablet 3   BREZTRI AEROSPHERE 160-9-4.8 MCG/ACT AERO Inhale into the lungs daily.     carvedilol (COREG) 12.5 MG tablet Take 12.5 mg by mouth daily.      Coenzyme Q10 (CO Q-10) 100 MG CAPS Take 100 mg by mouth daily.      ezetimibe (ZETIA) 10 MG tablet Take 10 mg by mouth daily.     fish oil-omega-3 fatty acids 1000 MG capsule Take 1,000 mg by mouth daily.      guaiFENesin (MUCINEX) 600 MG 12 hr tablet Take 1 tablet (600 mg total) by mouth 2 (two) times daily as needed for cough or to loosen phlegm.     ipratropium (ATROVENT) 0.03 % nasal spray Place 1 spray into both nostrils daily.      MICARDIS HCT 80-12.5 MG tablet TAKE 1 TABLET DAILY 90 tablet 3   Multiple Vitamin (MULTIVITAMIN WITH MINERALS) TABS tablet Take 1 tablet by mouth daily.     nitroGLYCERIN (NITROSTAT) 0.4 MG SL tablet Place 1 tablet (0.4 mg total)  under the tongue every 5 (five) minutes as needed for chest pain. 25 tablet 5   omeprazole (PRILOSEC) 10 MG capsule Take 10 mg by mouth daily.     sildenafil (VIAGRA) 100 MG tablet SMARTSIG:Tablet(s) By Mouth     No current facility-administered medications for this visit.    SURGICAL HISTORY:  Past Surgical History:  Procedure Laterality Date   ANGIOPLASTY     stent - rt cor. art   BILROTH II PROCEDURE     COLONOSCOPY     INTERCOSTAL NERVE BLOCK Right 01/24/2020   Procedure: Intercostal Nerve Block;  Surgeon: Grace Isaac, Daniel Colon;  Location: [place];  Service: Thoracic;  Laterality: Right;   LEFT HEART CATHETERIZATION WITH CORONARY ANGIOGRAM N/A 11/17/2013   Procedure: LEFT HEART CATHETERIZATION WITH CORONARY ANGIOGRAM;  Surgeon: Blane Ohara, Daniel Colon;  Location: [facility] CATH LAB;  Service:  Cardiovascular;  Laterality: N/A;   LUNG LOBECTOMY  01/2020   had chemo, no radtion   LYMPH NODE DISSECTION N/A 01/24/2020   Procedure: LYMPH NODE DISSECTION;  Surgeon: Grace Isaac, Daniel Colon;  Location: [facility] OR;  Service: Thoracic;  Laterality: N/A;   POLYPECTOMY     ulcer surgery     ULNAR NERVE TRANSPOSITION Right 08/05/2017   Procedure: RIGHT ULNAR NERVE DECOMPRESSION;  Surgeon: Leanora Cover, Daniel Colon;  Location: [place];  Service: Orthopedics;  Laterality: Right;   VIDEO BRONCHOSCOPY N/A 01/24/2020   Procedure: VIDEO BRONCHOSCOPY;  Surgeon: Grace Isaac, Daniel Colon;  Location: [facility] OR;  Service: Thoracic;  Laterality: N/A;    REVIEW OF SYSTEMS:  A comprehensive review of systems was negative except for: Respiratory: positive for pleurisy/chest pain and wheezing   PHYSICAL EXAMINATION: General appearance: alert, cooperative, and no distress Head: Normocephalic, without obvious abnormality, atraumatic Neck: no adenopathy, no JVD, supple, symmetrical, trachea midline, and thyroid not enlarged, symmetric, no tenderness/mass/nodules Lymph nodes: Cervical, supraclavicular, and axillary nodes normal. Resp: clear to auscultation bilaterally Back: symmetric, no curvature. ROM normal. No CVA tenderness. Cardio: regular rate and rhythm, S1, S2 normal, no murmur, click, rub or gallop GI: soft, non-tender; bowel sounds normal; no masses,  no organomegaly Extremities: extremities normal, atraumatic, no cyanosis or edema  ECOG PERFORMANCE STATUS: 1 - Symptomatic but completely ambulatory  Blood pressure 135/76, pulse (!) 58, temperature 98.1 F (36.7 C), temperature source Oral, resp. rate 15, weight 192 lb 14.4 oz (87.5 kg), SpO2 99 %.  LABORATORY DATA: Lab Results  Component Value Date   WBC 7.0 09/04/2022   HGB 13.4 09/04/2022   HCT 40.2 09/04/2022   MCV 83.1 09/04/2022   PLT 173 09/04/2022      Chemistry      Component Value Date/Time   NA 139 09/04/2022 0745   K 4.4  09/04/2022 0745   CL 109 09/04/2022 0745   CO2 25 09/04/2022 0745   BUN 27 (H) 09/04/2022 0745   CREATININE 0.79 09/04/2022 0745      Component Value Date/Time   CALCIUM 9.0 09/04/2022 0745   ALKPHOS 54 09/04/2022 0745   AST 22 09/04/2022 0745   ALT 36 09/04/2022 0745   BILITOT 0.8 09/04/2022 0745       RADIOGRAPHIC STUDIES: CT Chest W Contrast  Result Date: 09/05/2022 CLINICAL DATA:  Non-small-cell lung cancer. Restaging. * Tracking Code: BO * EXAM: CT CHEST WITH CONTRAST TECHNIQUE: Multidetector CT imaging of the chest was performed during intravenous contrast administration. RADIATION DOSE REDUCTION: This exam was performed according to the departmental dose-optimization program which includes automated exposure  control, adjustment of the mA and/or kV according to patient size and/or use of iterative reconstruction technique. CONTRAST:  49mL OMNIPAQUE IOHEXOL 300 MG/ML  SOLN COMPARISON:  03/02/2022 FINDINGS: Cardiovascular: The heart size is normal. No substantial pericardial effusion. Coronary artery calcification is evident. Mild atherosclerotic calcification is noted in the wall of the thoracic aorta. Mediastinum/Nodes: No mediastinal lymphadenopathy. There is no hilar lymphadenopathy. The esophagus has normal imaging features. There is no axillary lymphadenopathy. Lungs/Pleura: Volume loss in the right hemithorax is stable. Post treatment scarring in the right hilum and suprahilar right lung is stable pleuroparenchymal density in the right lung apex is unchanged. Stable calcified granulomas in the left upper and lower lobes. No new suspicious pulmonary nodule or mass. No focal airspace consolidation. No pleural effusion. Upper Abdomen: Tiny hypodensity in the left liver is stable, too small to characterize but consistent with benign etiology. No change small right adrenal adenoma. Musculoskeletal: No worrisome lytic or sclerotic osseous abnormality. IMPRESSION: 1. Stable exam. No new or  progressive findings. 2. Post treatment scarring in the right hilum and suprahilar right lung with volume loss in the right hemithorax. 3. Chronic pleuroparenchymal opacity in the right apex is stable. 4. Stable small right adrenal adenoma. 5. Aortic Atherosclerosis (ICD10-I70.0). Electronically Signed   By: Misty Stanley M.D.   On: 09/05/2022 10:51     ASSESSMENT AND PLAN: This is a very pleasant 76 years old white male recently diagnosed with a stage IIb non-small cell lung cancer with sarcomatoid features in February 2021 status post right upper lobectomy with lymph node dissection under the care of Dr. Servando Snare. The patient is also status post 4 cycles of adjuvant systemic chemotherapy with carboplatin and paclitaxel completed in May 2021. The patient is currently on observation and he is feeling fine with no concerning complaints except for the intermittent right-sided chest pain from the surgical scar. He had repeat CT scan of the chest performed recently.  I personally and independently reviewed the scan images and discussed the result with the patient and his wife. His scan showed no concerning findings for disease recurrence or metastasis. I recommended for him to continue on observation with repeat CT scan of the chest in 1 year. He was advised to call immediately if he has any other concerning symptoms in the interval. The patient voices understanding of current disease status and treatment options and is in agreement with the current care plan.  All questions were answered. The patient knows to call the clinic with any problems, questions or concerns. We can certainly see the patient much sooner if necessary.  Disclaimer: This note was dictated with voice recognition software. Similar sounding words can inadvertently be transcribed and may not be corrected upon review.

## 2022-09-08 ENCOUNTER — Telehealth: Payer: Self-pay | Admitting: Internal Medicine

## 2022-09-08 ENCOUNTER — Encounter: Payer: Self-pay | Admitting: Internal Medicine

## 2022-09-08 NOTE — Telephone Encounter (Signed)
Scheduled follow-up appointment per 9/18 los. Patient is aware.

## 2022-10-12 ENCOUNTER — Ambulatory Visit (INDEPENDENT_AMBULATORY_CARE_PROVIDER_SITE_OTHER): Payer: Medicare Other | Admitting: Pulmonary Disease

## 2022-10-12 ENCOUNTER — Encounter: Payer: Self-pay | Admitting: Pulmonary Disease

## 2022-10-12 VITALS — BP 120/56 | HR 56 | Temp 97.9°F | Ht 68.5 in | Wt 199.4 lb

## 2022-10-12 DIAGNOSIS — I2510 Atherosclerotic heart disease of native coronary artery without angina pectoris: Secondary | ICD-10-CM | POA: Diagnosis not present

## 2022-10-12 DIAGNOSIS — C3401 Malignant neoplasm of right main bronchus: Secondary | ICD-10-CM | POA: Diagnosis not present

## 2022-10-12 DIAGNOSIS — J439 Emphysema, unspecified: Secondary | ICD-10-CM

## 2022-10-12 DIAGNOSIS — J4489 Other specified chronic obstructive pulmonary disease: Secondary | ICD-10-CM

## 2022-10-12 NOTE — Progress Notes (Signed)
Daniel Colon    [id number]    08/07/1946  Primary Care Physician:Paterson, Ermalene Searing, MD  Referring Physician: Donnajean Lopes, MD [address] 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 Status post right upper lobectomy with lymph node dissection, status post 4 cycles of adjuvant systemic chemotherapy with carboplatin and paclitaxel completed in May 2021.  Complains of dyspnea on exertion for the past few months.  No symptoms at rest.  Has occasional cough with mucus production.  No fevers, chills  Pets: 2 cats.  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  No ongoing exposures with mold, hot tub, Jacuzzi.  No down pillows or comforters Smoking history: 40-pack-year smoker.  Quit in 2012 Travel history: Originally from [place].  No significant recent travel Relevant family history: No significant family history of lung disease  Interim history: Doing well with regard to his breathing.  Continues on breztri inhaler He is finished pulmonary rehab earlier in 2021 and is maintaining an active lifestyle with golfing  I have reviewed his follow-up visit with Dr. Julien Nordmann oncology and Dr. Roxy Horseman, cardiothoracic surgery.  Reviewed recent CT imaging which does not show cancer recurrence  Outpatient Encounter Medications as of 10/12/2022  Medication Sig   albuterol (VENTOLIN HFA) 108 (90 Base) MCG/ACT inhaler Inhale 2 puffs into the lungs 2 (two) times daily.   aspirin EC 81 MG tablet Take 81 mg by mouth 2 (two) times a week. In the morning   atorvastatin (LIPITOR) 40 MG tablet TAKE 1 TABLET DAILY   B12-ACTIVE 1 MG CHEW    BREZTRI AEROSPHERE 160-9-4.8 MCG/ACT AERO Inhale into the lungs  daily.   carvedilol (COREG) 12.5 MG tablet Take 12.5 mg by mouth daily.    Coenzyme Q10 (CO Q-10) 100 MG CAPS Take 100 mg by mouth daily.    ezetimibe (ZETIA) 10 MG tablet Take 10 mg by mouth daily.   fish oil-omega-3 fatty acids 1000 MG capsule Take 1,000 mg by mouth daily.    GNP IRON 200 (65 Fe) MG TABS    guaiFENesin (MUCINEX) 600 MG 12 hr tablet Take 1 tablet (600 mg total) by mouth 2 (two) times daily as needed for cough or to loosen phlegm.   ipratropium (ATROVENT) 0.03 % nasal spray Place 1 spray into both nostrils daily.    MICARDIS HCT 80-12.5 MG tablet TAKE 1 TABLET DAILY   Multiple Vitamin (MULTIVITAMIN WITH MINERALS) TABS tablet Take 1 tablet by mouth daily.   omeprazole (PRILOSEC) 10 MG capsule Take 10 mg by mouth daily.   sildenafil (VIAGRA) 100 MG tablet SMARTSIG:Tablet(s) By Mouth   nitroGLYCERIN (NITROSTAT) 0.4 MG SL tablet Place 1 tablet (0.4 mg total) under the tongue every 5 (five) minutes as needed for chest pain.   No facility-administered encounter medications on file as of 10/12/2022.    Allergies as of 10/12/2022   (No Known Allergies)    Physical Exam: Blood pressure 122/60, pulse 61, temperature 98.1 F (36.7 C), temperature source Oral, height 5' 8.5" (1.74 m), weight 198 lb 3.2 oz (89.9 kg), SpO2 95 %. Gen:      No acute distress HEENT:  EOMI, sclera anicteric Neck:     No masses; no thyromegaly Lungs:  Clear to auscultation bilaterally; normal respiratory effort CV:         Regular rate and rhythm; no murmurs Abd:      + bowel sounds; soft, non-tender; no palpable masses, no distension Ext:    No edema; adequate peripheral perfusion Skin:      Warm and dry; no rash Neuro: alert and oriented x 3 Psych: normal mood and affect   Data Reviewed: Imaging: CT chest 05/27/2020-s/p right upper lobectomy with loculated fluid, right adrenal adenoma, emphysema.  CT chest 09/01/2021-postsurgical changes in the right upper lobe.  No evidence of disease  recurrence.  Stable loculated apical fluid collection.  Right adrenal adenoma. I have reviewed the images personally.  PFTs: 01/09/2020 FVC 4.11 [97%], FEV1 2.86 [92%], F/F 70, TLC 6.37 [93%], DLCO 18.01 [72%] Mild obstruction, mild diffusion impairment  07/29/2020 FVC 3.23 [7 6%], FEV1 2.17 [70%], F/F 67, TLC 4.36 [63%], DLCO 15.22 [60%] Moderate obstruction with moderate restriction and diffusion defect  Labs: CBC 05/22/2020-WBC 6.1, eos 1%, absolute eosinophil count 62  Assessment:  COPD Lung cancer status post lobectomy Symptoms are stable on breztri He continues to get surveillance CTs with no evidence of recurrence Finished pulmonary rehab in 2021  Plan/Recommendations: Continue breztri, exercise regimen  Marshell Garfinkel MD [facility] 10/12/2022, 8:43 AM  CC: Donnajean Lopes, MD

## 2022-10-12 NOTE — Patient Instructions (Signed)
Take 2 puffs twice daily We will check your oxygen levels on exertion Please follow-up with your heart doctor about elevated heart rate Follow-up in 1 year.

## 2022-10-12 NOTE — Progress Notes (Signed)
Daniel Colon    [id number]    01-Apr-1946  Primary Care Physician:Paterson, Ermalene Searing, MD  Referring Physician: Donnajean Lopes, MD [address] [place],  [place] [address]  Chief complaint: Follow-up for COPD  HPI: 76 y.o.  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 Status post right upper lobectomy with lymph node dissection, status post 4 cycles of adjuvant systemic chemotherapy with carboplatin and paclitaxel completed in May 2021.  Complains of dyspnea on exertion for the past few months.  No symptoms at rest.  Has occasional cough with mucus production.  No fevers, chills  Pets: 2 cats.  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  No ongoing exposures with mold, hot tub, Jacuzzi.  No down pillows or comforters Smoking history: 40-pack-year smoker.  Quit in 2012 Travel history: Originally from [place].  No significant recent travel Relevant family history: No significant family history of lung disease  Interim history: Doing well with regard to his breathing.  Continues on breztri inhaler but is only taking 1 puff daily He is finished pulmonary rehab earlier in 2021 and is maintaining an active lifestyle with golfing. He does note that his heart rate goes to 160s when walking.  I have reviewed his follow-up visit with Dr. Julien Nordmann oncology and Dr. Roxy Horseman, cardiothoracic surgery.  Reviewed recent CT in September 2023 imaging which does not show cancer recurrence  Outpatient Encounter Medications as of 10/12/2022  Medication Sig   albuterol (VENTOLIN HFA) 108 (90 Base) MCG/ACT inhaler Inhale 2 puffs into the lungs 2 (two) times daily.   aspirin EC 81 MG tablet Take 81 mg by mouth 2 (two) times a week. In the morning   atorvastatin (LIPITOR) 40 MG tablet  TAKE 1 TABLET DAILY   B12-ACTIVE 1 MG CHEW    BREZTRI AEROSPHERE 160-9-4.8 MCG/ACT AERO Inhale into the lungs daily.   carvedilol (COREG) 12.5 MG tablet Take 12.5 mg by mouth daily.    Coenzyme Q10 (CO Q-10) 100 MG CAPS Take 100 mg by mouth daily.    ezetimibe (ZETIA) 10 MG tablet Take 10 mg by mouth daily.   fish oil-omega-3 fatty acids 1000 MG capsule Take 1,000 mg by mouth daily.    GNP IRON 200 (65 Fe) MG TABS    guaiFENesin (MUCINEX) 600 MG 12 hr tablet Take 1 tablet (600 mg total) by mouth 2 (two) times daily as needed for cough or to loosen phlegm.   ipratropium (ATROVENT) 0.03 % nasal spray Place 1 spray into both nostrils daily.    MICARDIS HCT 80-12.5 MG tablet TAKE 1 TABLET DAILY   Multiple Vitamin (MULTIVITAMIN WITH MINERALS) TABS tablet Take 1 tablet by mouth daily.   omeprazole (PRILOSEC) 10 MG capsule Take 10 mg by mouth daily.   sildenafil (VIAGRA) 100 MG tablet SMARTSIG:Tablet(s) By Mouth   nitroGLYCERIN (NITROSTAT) 0.4 MG SL tablet Place 1 tablet (0.4 mg total) under the tongue every 5 (five) minutes as needed for chest pain.   No facility-administered encounter medications on file as of 10/12/2022.    Allergies as of 10/12/2022   (No Known Allergies)    Physical Exam: Blood pressure (!) 120/56, pulse (!) 56, temperature 97.9 F (36.6 C), temperature source Oral, height 5' 8.5" (1.74 m), weight 199 lb 6.4 oz (90.4 kg), SpO2  98 %. Gen:      No acute distress HEENT:  EOMI, sclera anicteric Neck:     No masses; no thyromegaly Lungs:    Clear to auscultation bilaterally; normal respiratory effort CV:         Regular rate and rhythm; no murmurs Abd:      + bowel sounds; soft, non-tender; no palpable masses, no distension Ext:    No edema; adequate peripheral perfusion Skin:      Warm and dry; no rash Neuro: alert and oriented x 3 Psych: normal mood and affect   Data Reviewed: Imaging: CT chest 05/27/2020-s/p right upper lobectomy with loculated fluid, right adrenal  adenoma, emphysema.  CT chest 09/01/2021-postsurgical changes in the right upper lobe.  No evidence of disease recurrence.  Stable loculated apical fluid collection.  Right adrenal adenoma.  CT chest 09/04/2022-stable postsurgical changes in the right lung.  Stable opacity in the right apex.  Stable right adrenal adenoma I have reviewed the images personally.  PFTs: 01/09/2020 FVC 4.11 [97%], FEV1 2.86 [92%], F/F 70, TLC 6.37 [93%], DLCO 18.01 [72%] Mild obstruction, mild diffusion impairment  07/29/2020 FVC 3.23 [7 6%], FEV1 2.17 [70%], F/F 67, TLC 4.36 [63%], DLCO 15.22 [60%] Moderate obstruction with moderate restriction and diffusion defect  Labs: CBC 05/22/2020-WBC 6.1, eos 1%, absolute eosinophil count 62  Assessment:  COPD Lung cancer status post lobectomy For some reason breztri 1 puff daily.  Previous prescription to 2 puffs daily. He continues to get surveillance CTs with no evidence of recurrence.  Last CT in September 2023 reviewed Finished pulmonary rehab in 2021.  He continues to be active with golfing  He does report increase in heart rate while walking but on walk test today his heart rate went up to 72 and sats dropped only to 91% Encouraged him to follow-up with his cardiologist  Plan/Recommendations: Continue breztri.  Take 2 puffs twice daily.  exercise regimen  Marshell Garfinkel MD [facility] 10/12/2022, 8:41 AM  CC: Donnajean Lopes, MD

## 2022-11-04 DIAGNOSIS — L570 Actinic keratosis: Secondary | ICD-10-CM | POA: Diagnosis not present

## 2022-12-04 DIAGNOSIS — Z23 Encounter for immunization: Secondary | ICD-10-CM | POA: Diagnosis not present

## 2023-01-05 ENCOUNTER — Encounter: Payer: Self-pay | Admitting: Internal Medicine

## 2023-01-11 DIAGNOSIS — E291 Testicular hypofunction: Secondary | ICD-10-CM | POA: Diagnosis not present

## 2023-01-11 DIAGNOSIS — R7989 Other specified abnormal findings of blood chemistry: Secondary | ICD-10-CM | POA: Diagnosis not present

## 2023-01-11 DIAGNOSIS — Z125 Encounter for screening for malignant neoplasm of prostate: Secondary | ICD-10-CM | POA: Diagnosis not present

## 2023-01-11 DIAGNOSIS — E785 Hyperlipidemia, unspecified: Secondary | ICD-10-CM | POA: Diagnosis not present

## 2023-01-11 DIAGNOSIS — Z1212 Encounter for screening for malignant neoplasm of rectum: Secondary | ICD-10-CM | POA: Diagnosis not present

## 2023-01-11 DIAGNOSIS — K219 Gastro-esophageal reflux disease without esophagitis: Secondary | ICD-10-CM | POA: Diagnosis not present

## 2023-01-11 DIAGNOSIS — I10 Essential (primary) hypertension: Secondary | ICD-10-CM | POA: Diagnosis not present

## 2023-01-11 LAB — LAB REPORT - SCANNED: EGFR: 82.5

## 2023-01-18 DIAGNOSIS — Z9861 Coronary angioplasty status: Secondary | ICD-10-CM | POA: Diagnosis not present

## 2023-01-18 DIAGNOSIS — Z1339 Encounter for screening examination for other mental health and behavioral disorders: Secondary | ICD-10-CM | POA: Diagnosis not present

## 2023-01-18 DIAGNOSIS — C3401 Malignant neoplasm of right main bronchus: Secondary | ICD-10-CM | POA: Diagnosis not present

## 2023-01-18 DIAGNOSIS — Z0000 Encounter for general adult medical examination without abnormal findings: Secondary | ICD-10-CM | POA: Diagnosis not present

## 2023-01-18 DIAGNOSIS — I700 Atherosclerosis of aorta: Secondary | ICD-10-CM | POA: Diagnosis not present

## 2023-01-18 DIAGNOSIS — Z1331 Encounter for screening for depression: Secondary | ICD-10-CM | POA: Diagnosis not present

## 2023-01-18 DIAGNOSIS — Z85118 Personal history of other malignant neoplasm of bronchus and lung: Secondary | ICD-10-CM | POA: Diagnosis not present

## 2023-01-18 DIAGNOSIS — I10 Essential (primary) hypertension: Secondary | ICD-10-CM | POA: Diagnosis not present

## 2023-01-18 DIAGNOSIS — J439 Emphysema, unspecified: Secondary | ICD-10-CM | POA: Diagnosis not present

## 2023-01-18 DIAGNOSIS — K2270 Barrett's esophagus without dysplasia: Secondary | ICD-10-CM | POA: Diagnosis not present

## 2023-01-18 DIAGNOSIS — M25551 Pain in right hip: Secondary | ICD-10-CM | POA: Diagnosis not present

## 2023-01-18 DIAGNOSIS — E785 Hyperlipidemia, unspecified: Secondary | ICD-10-CM | POA: Diagnosis not present

## 2023-01-18 DIAGNOSIS — R82998 Other abnormal findings in urine: Secondary | ICD-10-CM | POA: Diagnosis not present

## 2023-01-18 DIAGNOSIS — E291 Testicular hypofunction: Secondary | ICD-10-CM | POA: Diagnosis not present

## 2023-01-18 DIAGNOSIS — I739 Peripheral vascular disease, unspecified: Secondary | ICD-10-CM | POA: Diagnosis not present

## 2023-02-03 ENCOUNTER — Other Ambulatory Visit: Payer: Self-pay | Admitting: Cardiology

## 2023-02-17 DIAGNOSIS — M1611 Unilateral primary osteoarthritis, right hip: Secondary | ICD-10-CM | POA: Diagnosis not present

## 2023-02-19 ENCOUNTER — Encounter: Payer: Self-pay | Admitting: Cardiology

## 2023-02-19 ENCOUNTER — Telehealth: Payer: Self-pay | Admitting: *Deleted

## 2023-02-19 NOTE — Telephone Encounter (Signed)
   Pre-operative Risk Assessment    Patient Name: Daniel Colon  DOB: Jun 01, 1946 MRN: [id number]      Request for Surgical Clearance    Procedure:   RT TOTAL HIP ARTHROPLASTY   Date of Surgery:  Clearance 05/26/23      OR BEFORE                           Surgeon:  DR. Gaynelle Arabian Surgeon's Group or Practice Name:  [facility] Phone number:  [phone] Fax number:  [phone]   Type of Clearance Requested:   -Pharmacy  ASPIRIN  NOT INDICATED HOW LONG   Type of Anesthesia:   CHOICE   Additional requests/questions:    Astrid Divine   02/19/2023, 10:14 AM

## 2023-02-19 NOTE — Telephone Encounter (Signed)
   Name: Daniel Colon  DOB: 1946/04/03  MRN: [id number]  Primary Cardiologist: None  Chart reviewed as part of pre-operative protocol coverage. Because of Nygil Luttrull past medical history and time since last visit, he will require a follow-up in-office visit in order to better assess preoperative cardiovascular risk.  Pre-op covering staff: - Please schedule appointment and call patient to inform them. If patient already had an upcoming appointment within acceptable timeframe, please add "pre-op clearance" to the appointment notes so provider is aware. - Please contact requesting surgeon's office via preferred method (i.e, phone, fax) to inform them of need for appointment prior to surgery.  ASA hold can be determined at the time of in-office appointment.  Elgie Collard, PA-C  02/19/2023, 4:52 PM

## 2023-02-22 DIAGNOSIS — M1812 Unilateral primary osteoarthritis of first carpometacarpal joint, left hand: Secondary | ICD-10-CM | POA: Diagnosis not present

## 2023-03-09 NOTE — Progress Notes (Unsigned)
Cardiology Office Note   Date:  03/10/2023   ID:  Daniel, Colon 01-Sep-1946, MRN [id number]  PCP:  Daniel Lopes, MD  Cardiologist:   None   Chief Complaint  Patient presents with   Coronary Artery Disease      History of Present Illness: Daniel Colon is a 77 y.o. male who presents for follow up of  CAD, s/p stent to RCA in 60 in [place].  Routine ETT(11/13/13) was abnormal with early ST changes to suggest ischemia. He was set up had LHC (11/17/13):  pLAD 30-40, D1 30, mLAD 70-75, mCFX 70, mRCA stent 40 (ISR), EF 55-65%.   He was managed medically.  He returns for follow up. He had lung right upper lobe resection.  He was treated with chemotherapy.    He presents for follow-up and preop clearance prior to possible hip surgery.  When I saw him last year he was golfing 18 holes and biking at the gym and not having any symptoms.  However, he said that probably about a month after I saw him he started noticing some decreased exercise tolerance.  He started having some muscle weakness.  He was getting a little more short of breath with activities.  Some were in the course of the last year he started noticing some mild chest discomfort with activity.  He said this is a dull 5 out of 10 discomfort the last for several minutes but goes away when he stops walking.  He has noticed at times his oxygen saturation has been down.  He has been seen by pulmonary.  He has not had a new EKG since I last saw him and he does have changes as described below.  He is not having any resting shortness of breath, PND or orthopnea.  He is not having any new palpitations, presyncope or syncope.  Was a question of whether he should use a more beta selective beta-blocker.  Past Medical History:  Diagnosis Date   Allergy    Arthritis    Barrett's esophagus    CAD (coronary artery disease)    a. Stent to the [place] (in [place]);  b. ETT(11/13/13): abnormal with early ST changes to suggest ischemia;    c. LHC (11/17/13):  pLAD 30-40, D1 30, mLAD 70-75, mCFX 70, mRCA stent 40 (ISR), EF 55-65%.  Medical Rx   Cataract    left eye removed, right immature   Colon polyps    Dyslipidemia    GERD (gastroesophageal reflux disease)    Hypertension    Myocardial infarction [facility]) 1996   Nephrolithiasis    kidney stone   NSCL Ca dx'd 12/2019   PUD (peptic ulcer disease)     Past Surgical History:  Procedure Laterality Date   ANGIOPLASTY     stent - rt cor. art   BILROTH II PROCEDURE     COLONOSCOPY     INTERCOSTAL NERVE BLOCK Right 01/24/2020   Procedure: Intercostal Nerve Block;  Surgeon: Grace Isaac, MD;  Location: [facility];  Service: Thoracic;  Laterality: Right;   LEFT HEART CATHETERIZATION WITH CORONARY ANGIOGRAM N/A 11/17/2013   Procedure: LEFT HEART CATHETERIZATION WITH CORONARY ANGIOGRAM;  Surgeon: Blane Ohara, MD;  Location: [facility] CATH LAB;  Service: Cardiovascular;  Laterality: N/A;   LUNG LOBECTOMY  01/2020   had chemo, no radtion   LYMPH NODE DISSECTION N/A 01/24/2020   Procedure: LYMPH NODE DISSECTION;  Surgeon: Grace Isaac, MD;  Location: [facility]  OR;  Service: Thoracic;  Laterality: N/A;   POLYPECTOMY     ulcer surgery     ULNAR NERVE TRANSPOSITION Right 08/05/2017   Procedure: RIGHT ULNAR NERVE DECOMPRESSION;  Surgeon: Leanora Cover, MD;  Location: [facility];  Service: Orthopedics;  Laterality: Right;   VIDEO BRONCHOSCOPY N/A 01/24/2020   Procedure: VIDEO BRONCHOSCOPY;  Surgeon: Grace Isaac, MD;  Location: [facility] OR;  Service: Thoracic;  Laterality: N/A;     Current Outpatient Medications  Medication Sig Dispense Refill   albuterol (VENTOLIN HFA) 108 (90 Base) MCG/ACT inhaler Inhale 2 puffs into the lungs 2 (two) times daily.     aspirin EC 81 MG tablet Take 81 mg by mouth 2 (two) times a week. In the morning     atorvastatin (LIPITOR) 40 MG tablet TAKE 1 TABLET DAILY 90 tablet 0   BREZTRI AEROSPHERE 160-9-4.8 MCG/ACT AERO Inhale into the lungs  daily.     carvedilol (COREG) 12.5 MG tablet Take 12.5 mg by mouth daily.      Coenzyme Q10 (CO Q-10) 100 MG CAPS Take 100 mg by mouth daily.      ezetimibe (ZETIA) 10 MG tablet Take 10 mg by mouth daily.     fish oil-omega-3 fatty acids 1000 MG capsule Take 1,000 mg by mouth daily.      fluorouracil (EFUDEX) 5 % cream as needed (rash).     GNP IRON 200 (65 Fe) MG TABS      guaiFENesin (MUCINEX) 600 MG 12 hr tablet Take 1 tablet (600 mg total) by mouth 2 (two) times daily as needed for cough or to loosen phlegm.     ipratropium (ATROVENT) 0.03 % nasal spray Place 1 spray into both nostrils daily.      MICARDIS HCT 80-12.5 MG tablet TAKE 1 TABLET DAILY 90 tablet 3   Multiple Vitamin (MULTIVITAMIN WITH MINERALS) TABS tablet Take 1 tablet by mouth daily.     omeprazole (PRILOSEC) 10 MG capsule Take 10 mg by mouth daily.     sildenafil (VIAGRA) 100 MG tablet SMARTSIG:Tablet(s) By Mouth     VITAMIN B COMPLEX-C PO Take 200 mg by mouth daily at 6 (six) AM.     nitroGLYCERIN (NITROSTAT) 0.4 MG SL tablet Place 1 tablet (0.4 mg total) under the tongue every 5 (five) minutes as needed for chest pain. 25 tablet 5   No current facility-administered medications for this visit.    Allergies:   Patient has no known allergies.    ROS:  Please see the history of present illness.   Otherwise, review of systems are positive for none.   All other systems are reviewed and negative.    PHYSICAL EXAM: VS:  BP 132/60   Pulse (!) 51   Ht 5' 8.5" (1.74 m)   Wt 193 lb (87.5 kg)   SpO2 99%   BMI 28.92 kg/m  , BMI Body mass index is 28.92 kg/m. GENERAL:  Well appearing NECK:  No jugular venous distention, waveform within normal limits, carotid upstroke brisk and symmetric, no bruits, no thyromegaly LUNGS:  Clear to auscultation bilaterally CHEST:  Unremarkable HEART:  PMI not displaced or sustained,S1 and S2 within normal limits, no S3, no S4, no clicks, no rubs, no murmurs ABD:  Flat, positive bowel sounds  normal in frequency in pitch, no bruits, no rebound, no guarding, no midline pulsatile mass, no hepatomegaly, no splenomegaly EXT:  2 plus pulses throughout, no edema, no cyanosis no clubbing  EKG:  EKG is  ordered today. The ekg ordered today demonstrates sinus rhythm, rate 51, deep T wave inversions in the anterior leads new since previous EKG.   Recent Labs: 09/04/2022: ALT 36; BUN 27; Creatinine 0.79; Hemoglobin 13.4; Platelet Count 173; Potassium 4.4; Sodium 139    Lipid Panel No results found for: "CHOL", "TRIG", "HDL", "CHOLHDL", "VLDL", "LDLCALC", "LDLDIRECT"    Wt Readings from Last 3 Encounters:  03/10/23 193 lb (87.5 kg)  10/12/22 199 lb 6.4 oz (90.4 kg)  09/07/22 192 lb 14.4 oz (87.5 kg)      Other studies Reviewed: Additional studies/ records that were reviewed today include: Labs . Review of the above records demonstrates:  Please see elsewhere in the note.     ASSESSMENT AND PLAN:  CAD:   I am concerned that the patient might of had some intervening events since I last saw him.  He has new symptoms and EKG changes.  I am going to start with an echocardiogram and have a low threshold for further testing to include possibly CT versus cardiac cath.  Med changes will be pending the results of the above.   DYSLIPIDEMIA: LDL was 53 with an HDL 47 recently.  No change in therapy.   HTN: Blood pressure is at target.  Further med changes pending the results of the above.  SOB:   He has some chronic shortness of breath and lung disease but I will check an echocardiogram and further testing as indicated.   PREOP: Preop clearance will be based on the workup above.  He should defer surgery pending this.  Current medicines are reviewed at length with the patient today.  The patient does not have concerns regarding medicines.  The following changes have been made: None  Labs/ tests ordered today include: None  Orders Placed This Encounter  Procedures   EKG 12-Lead    ECHOCARDIOGRAM COMPLETE     Disposition:   FU with me in 12 months.     Signed, Minus Breeding, MD  03/10/2023 1:43 PM    [facility]

## 2023-03-10 ENCOUNTER — Ambulatory Visit: Payer: Medicare Other | Attending: Cardiology | Admitting: Cardiology

## 2023-03-10 ENCOUNTER — Encounter: Payer: Self-pay | Admitting: Cardiology

## 2023-03-10 VITALS — BP 132/60 | HR 51 | Ht 68.5 in | Wt 193.0 lb

## 2023-03-10 DIAGNOSIS — I2510 Atherosclerotic heart disease of native coronary artery without angina pectoris: Secondary | ICD-10-CM | POA: Insufficient documentation

## 2023-03-10 DIAGNOSIS — Z01810 Encounter for preprocedural cardiovascular examination: Secondary | ICD-10-CM | POA: Diagnosis not present

## 2023-03-10 DIAGNOSIS — E785 Hyperlipidemia, unspecified: Secondary | ICD-10-CM | POA: Diagnosis not present

## 2023-03-10 NOTE — Patient Instructions (Signed)
Medication Instructions:  Your physician recommends that you continue on your current medications as directed. Please refer to the Current Medication list given to you today.  *If you need a refill on your cardiac medications before your next appointment, please call your pharmacy*    Testing/Procedures: Your physician has requested that you have an echocardiogram. Echocardiography is a painless test that uses sound waves to create images of your heart. It provides your doctor with information about the size and shape of your heart and how well your heart's chambers and valves are working. This procedure takes approximately one hour. There are no restrictions for this procedure. Please do NOT wear cologne, perfume, aftershave, or lotions (deodorant is allowed). Please arrive 15 minutes prior to your appointment time.    Follow-Up: At [facility], you and your health needs are our priority.  As part of our continuing mission to provide you with exceptional heart care, we have created designated Provider Care Teams.  These Care Teams include your primary Cardiologist (physician) and Advanced Practice Providers (APPs -  Physician Assistants and Nurse Practitioners) who all work together to provide you with the care you need, when you need it.  We recommend signing up for the patient portal called "MyChart".  Sign up information is provided on this After Visit Summary.  MyChart is used to connect with patients for Virtual Visits (Telemedicine).  Patients are able to view lab/test results, encounter notes, upcoming appointments, etc.  Non-urgent messages can be sent to your provider as well.   To learn more about what you can do with MyChart, go to [email or web address].    Your next appointment:   1 month(s)  Provider:   Minus Breeding, MD

## 2023-03-12 ENCOUNTER — Telehealth: Payer: Self-pay | Admitting: *Deleted

## 2023-03-12 NOTE — Telephone Encounter (Signed)
   Patient Name: Daniel Colon  DOB: 14-Apr-1946 MRN: [id number]  Primary Cardiologist: None  Chart reviewed as part of pre-operative protocol coverage. Pre-op clearance already addressed by colleagues in earlier phone notes. To summarize recommendations:  -PREOP: Preop clearance will be based on the workup.  He should defer surgery pending this.  -Dr. Percival Spanish  Patient is not cleared at this time pending cardiac workup for symptoms. Please resend clearance at a later date once cardiac workup is complete.   Will route this bundled recommendation to requesting provider via Epic fax function and remove from pre-op pool. Please call with questions.  Elgie Collard, PA-C 03/12/2023, 12:32 PM

## 2023-03-12 NOTE — Telephone Encounter (Signed)
   Pre-operative Risk Assessment    Patient Name: Daniel Colon  DOB: 10/23/1946 MRN: [id number]      Request for Surgical Clearance    Procedure:   RIGHT TOTAL HIP ARTHROPLASTY  Date of Surgery:  Clearance 05/05/23                                 Surgeon:  DR. Gaynelle Arabian Surgeon's Group or Practice Name:  [facility] Phone number:  [phone] ATTN: [place] Fax number:  [phone]   Type of Clearance Requested:   - Medical ; ASA    Type of Anesthesia:   CHOICE   Additional requests/questions:    Jiles Prows   03/12/2023, 12:08 PM

## 2023-03-15 ENCOUNTER — Encounter: Payer: Self-pay | Admitting: Cardiology

## 2023-03-25 ENCOUNTER — Ambulatory Visit (HOSPITAL_COMMUNITY): Payer: Medicare Other | Attending: Cardiology

## 2023-03-25 DIAGNOSIS — I2510 Atherosclerotic heart disease of native coronary artery without angina pectoris: Secondary | ICD-10-CM | POA: Diagnosis not present

## 2023-03-25 DIAGNOSIS — Z01810 Encounter for preprocedural cardiovascular examination: Secondary | ICD-10-CM | POA: Diagnosis not present

## 2023-03-25 LAB — ECHOCARDIOGRAM COMPLETE
Area-P 1/2: 4.63 cm2
S' Lateral: 3.4 cm

## 2023-03-29 ENCOUNTER — Encounter: Payer: Self-pay | Admitting: Cardiology

## 2023-03-29 DIAGNOSIS — R0602 Shortness of breath: Secondary | ICD-10-CM

## 2023-03-31 ENCOUNTER — Encounter: Payer: Self-pay | Admitting: *Deleted

## 2023-03-31 DIAGNOSIS — R0602 Shortness of breath: Secondary | ICD-10-CM

## 2023-03-31 NOTE — Telephone Encounter (Signed)
This encounter was created in error - please disregard.

## 2023-03-31 NOTE — Telephone Encounter (Signed)
-----   Message from Rollene Rotunda, MD sent at 03/28/2023  5:39 PM EDT ----- Echo looks OK.  I would like to schedule a PET test given his decreased exercise tolerance and SOB with his known CAD and possible hip surgery pending.  Call Mr. Chavana with the results and send results to Garlan Fillers, MD

## 2023-04-05 ENCOUNTER — Ambulatory Visit: Payer: Medicare Other | Admitting: Cardiology

## 2023-04-06 ENCOUNTER — Ambulatory Visit (HOSPITAL_COMMUNITY): Payer: Medicare Other

## 2023-04-13 NOTE — H&P (Signed)
TOTAL HIP ADMISSION H&P  Patient is admitted for right total hip arthroplasty.  Subjective:  Chief Complaint: Right hip pain  HPI: Daniel Colon, 77 y.o. male, has a history of pain and functional disability in the right hip due to arthritis and patient has failed non-surgical conservative treatments for greater than 12 weeks to include NSAID's and/or analgesics and activity modification. Onset of symptoms was gradual, starting >10 years ago with gradually worsening course since that time. The patient noted no past surgery on the right hip. Patient currently rates pain in the right hip at 9 out of 10 with activity. Patient has night pain, worsening of pain with activity and weight bearing, pain that interfers with activities of daily living, and pain with passive range of motion. Patient has evidence of  bone-on-bone arthritis in that right hip with subchondral cystic formation and marginal osteophyte formation  by imaging studies. This condition presents safety issues increasing the risk of falls. There is no current active infection.  Patient Active Problem List   Diagnosis Date Noted   Coronary artery disease involving native coronary artery of native heart without angina pectoris 01/25/2022   SOB (shortness of breath) 01/11/2021   COPD with chronic bronchitis and emphysema 07/03/2020   Abdominal pain 03/19/2020   Goals of care, counseling/discussion 02/13/2020   Encounter for antineoplastic chemotherapy 02/13/2020   S/P lobectomy of lung 01/24/2020   Lung cancer 01/24/2020   Preop cardiovascular exam 01/11/2020   Educated about COVID-19 virus infection 01/11/2020   Abnormal screening CT of chest 01/05/2020   Rectal bleeding 11/13/2019   Dyslipidemia 10/01/2019   Laceration of left ring finger without foreign body without damage to nail 11/24/2017   Hypertension 11/27/2013   HLD (hyperlipidemia) 11/27/2013   GERD (gastroesophageal reflux disease) 10/30/2013   Barrett's esophagus  10/30/2013   History of adenomatous polyp of colon 10/30/2013   CAD in native artery 10/02/2013    Past Medical History:  Diagnosis Date   Allergy    Arthritis    Barrett's esophagus    CAD (coronary artery disease)    a. Stent to the RCA 1996 (in [place]);  b. ETT(11/13/13): abnormal with early ST changes to suggest ischemia;   c. LHC (11/17/13):  pLAD 30-40, D1 30, mLAD 70-75, mCFX 70, mRCA stent 40 (ISR), EF 55-65%.  Medical Rx   Cataract    left eye removed, right immature   Colon polyps    Dyslipidemia    GERD (gastroesophageal reflux disease)    Hypertension    Myocardial infarction [facility]) 1996   Nephrolithiasis    kidney stone   NSCL Ca dx'd 12/2019   PUD (peptic ulcer disease)     Past Surgical History:  Procedure Laterality Date   ANGIOPLASTY     stent - rt cor. art   BILROTH II PROCEDURE     COLONOSCOPY     INTERCOSTAL NERVE BLOCK Right 01/24/2020   Procedure: Intercostal Nerve Block;  Surgeon: Delight Ovens, MD;  Location: [facility] OR;  Service: Thoracic;  Laterality: Right;   LEFT HEART CATHETERIZATION WITH CORONARY ANGIOGRAM N/A 11/17/2013   Procedure: LEFT HEART CATHETERIZATION WITH CORONARY ANGIOGRAM;  Surgeon: Micheline Chapman, MD;  Location: [facility] CATH LAB;  Service: Cardiovascular;  Laterality: N/A;   LUNG LOBECTOMY  01/2020   had chemo, no radtion   LYMPH NODE DISSECTION N/A 01/24/2020   Procedure: LYMPH NODE DISSECTION;  Surgeon: Delight Ovens, MD;  Location: [facility] OR;  Service: Thoracic;  Laterality: N/A;  POLYPECTOMY     ulcer surgery     ULNAR NERVE TRANSPOSITION Right 08/05/2017   Procedure: RIGHT ULNAR NERVE DECOMPRESSION;  Surgeon: Betha Loa, MD;  Location: [facility];  Service: Orthopedics;  Laterality: Right;   VIDEO BRONCHOSCOPY N/A 01/24/2020   Procedure: VIDEO BRONCHOSCOPY;  Surgeon: Delight Ovens, MD;  Location: [facility] OR;  Service: Thoracic;  Laterality: N/A;    Prior to Admission medications   Medication Sig Start Date  End Date Taking? Authorizing Provider  albuterol (VENTOLIN HFA) 108 (90 Base) MCG/ACT inhaler Inhale 2 puffs into the lungs 2 (two) times daily. 11/19/20   [provider]  aspirin EC 81 MG tablet Take 81 mg by mouth 2 (two) times a week. In the morning    [provider]  atorvastatin (LIPITOR) 40 MG tablet TAKE 1 TABLET DAILY 02/03/23   Rollene Rotunda, MD  BREZTRI AEROSPHERE 160-9-4.8 MCG/ACT AERO Inhale into the lungs daily. 06/25/20   [provider]  carvedilol (COREG) 12.5 MG tablet Take 12.5 mg by mouth daily.     [provider]  Coenzyme Q10 (CO Q-10) 100 MG CAPS Take 100 mg by mouth daily.     [provider]  ezetimibe (ZETIA) 10 MG tablet Take 10 mg by mouth daily.    [provider]  fish oil-omega-3 fatty acids 1000 MG capsule Take 1,000 mg by mouth daily.     [provider]  fluorouracil (EFUDEX) 5 % cream as needed (rash). 11/04/22   [provider]  GNP IRON 200 (65 Fe) MG TABS  04/27/22   [provider]  guaiFENesin (MUCINEX) 600 MG 12 hr tablet Take 1 tablet (600 mg total) by mouth 2 (two) times daily as needed for cough or to loosen phlegm. 02/01/20   Ardelle Balls, PA-C  ipratropium (ATROVENT) 0.03 % nasal spray Place 1 spray into both nostrils daily.  10/19/15   [provider]  MICARDIS HCT 80-12.5 MG tablet TAKE 1 TABLET DAILY 08/10/22   Rollene Rotunda, MD  Multiple Vitamin (MULTIVITAMIN WITH MINERALS) TABS tablet Take 1 tablet by mouth daily.    [provider]  nitroGLYCERIN (NITROSTAT) 0.4 MG SL tablet Place 1 tablet (0.4 mg total) under the tongue every 5 (five) minutes as needed for chest pain. 03/26/22 06/24/22  Rollene Rotunda, MD  omeprazole (PRILOSEC) 10 MG capsule Take 10 mg by mouth daily.    [provider]  sildenafil (VIAGRA) 100 MG tablet SMARTSIG:Tablet(s) By Mouth 12/23/21   [provider]  VITAMIN B COMPLEX-C PO Take 200 mg by mouth daily at  6 (six) AM.    [provider]    No Known Allergies  Social History   Socioeconomic History   Marital status: Married    Spouse name: Not on file   Number of children: 1   Years of education: Not on file   Highest education level: Not on file  Occupational History   Occupation: retired  Tobacco Use   Smoking status: Former    Packs/day: 1.00    Years: 42.00    Additional pack years: 0.00    Total pack years: 42.00    Types: Cigarettes    Quit date: 06/21/2011    Years since quitting: 11.8   Smokeless tobacco: Former    Types: Associate Professor Use: Never used  Substance and Sexual Activity   Alcohol use: Yes    Alcohol/week: 0.0 standard drinks of alcohol  Comment: social   Drug use: No   Sexual activity: Not on file  Other Topics Concern   Not on file  Social History Narrative   Lives with wife and mother in law.    Social Determinants of Health   Financial Resource Strain: Not on file  Food Insecurity: Not on file  Transportation Needs: Not on file  Physical Activity: Not on file  Stress: Not on file  Social Connections: Not on file  Intimate Partner Violence: Not on file    Tobacco Use: Medium Risk (03/10/2023)   Patient History    Smoking Tobacco Use: Former    Smokeless Tobacco Use: Former    Passive Exposure: Not on file   Social History   Substance and Sexual Activity  Alcohol Use Yes   Alcohol/week: 0.0 standard drinks of alcohol   Comment: social    Family History  Problem Relation Age of Onset   Sudden death Mother 33   CAD Brother 16       CABG   CAD Sister 60       CABG   CAD Sister 29       CABG   Cancer Brother    Liver disease Other    Colon cancer Neg Hx    Colon polyps Neg Hx    Esophageal cancer Neg Hx    Rectal cancer Neg Hx    Stomach cancer Neg Hx     Review of Systems  Constitutional:  Negative for chills and fever.  HENT:  Negative for congestion, sore throat and tinnitus.   Eyes:  Negative  for double vision, photophobia and pain.  Respiratory:  Negative for cough, shortness of breath and wheezing.   Cardiovascular:  Negative for chest pain, palpitations and orthopnea.  Gastrointestinal:  Negative for heartburn, nausea and vomiting.  Genitourinary:  Negative for dysuria, frequency and urgency.  Musculoskeletal:  Positive for joint pain.  Neurological:  Negative for dizziness, weakness and headaches.     Objective:  Physical Exam: Well nourished and well developed.  General: Alert and oriented x3, cooperative and pleasant, no acute distress.  Head: normocephalic, atraumatic, neck supple.  Eyes: EOMI.  Musculoskeletal:  His right hip could be flexed to 90 degrees, no internal rotation, only about 10 degrees of external rotation, 20 degrees of abduction with discomfort. His right leg is about 0.5 inch longer than the left from that left lower extremity deformity that he had as a child. There is no trochanteric tenderness  Calves soft and nontender. Motor function intact in LE. Strength 5/5 LE bilaterally. Neuro: Distal pulses 2+. Sensation to light touch intact in LE.   Imaging Review Plain radiographs demonstrate severe degenerative joint disease of the right hip. The bone quality appears to be adequate for age and reported activity level.  Assessment/Plan:  End stage arthritis, right hip  The patient history, physical examination, clinical judgement of the provider and imaging studies are consistent with end stage degenerative joint disease of the right hip and total hip arthroplasty is deemed medically necessary. The treatment options including medical management, injection therapy, arthroscopy and arthroplasty were discussed at length. The risks and benefits of total hip arthroplasty were presented and reviewed. The risks due to aseptic loosening, infection, stiffness, dislocation/subluxation, thromboembolic complications and other imponderables were discussed. The  patient acknowledged the explanation, agreed to proceed with the plan and consent was signed. Patient is being admitted for inpatient treatment for surgery, pain control, PT, OT, prophylactic antibiotics, VTE prophylaxis, progressive  ambulation and ADLs and discharge planning.The patient is planning to be discharged  home .  Therapy Plans: HEP Disposition: Home with wife Planned DVT Prophylaxis: Xarelto 10 mg QD DME Needed: None PCP: Jarome Matin, MD (clearance received) Cardiologist: Rollene Rotunda, MD (PET pending) TXA: IV Allergies: NKDA Anesthesia Concerns: Agitation with previous general anesthesia BMI: 29.4 Last HgbA1c: Not diabetic Pharmacy: [facility])  Other: - EKG was abnormal with Hochrein, ordered placed for PET scan (awaiting scheduling) - Hx of CAD with stents, gastric ulcer and lung CA (right upper lobe removed) - Normally takes 81 mg ASA a few times a week, cannot tolerate every day due to stomach irritation  - Patient was instructed on what medications to stop prior to surgery. - Follow-up visit in 2 weeks with Dr. Lequita Halt - Begin physical therapy following surgery - Pre-operative lab work as pre-surgical testing - Prescriptions will be provided in hospital at time of discharge  Arther Abbott, PA-C Orthopedic Surgery [facility] [place]

## 2023-04-14 NOTE — Patient Instructions (Addendum)
SURGICAL WAITING ROOM VISITATION  Patients having surgery or a procedure may have no more than 2 support people in the waiting area - these visitors may rotate.    Children under the age of 52 must have an adult with them who is not the patient.  Due to an increase in RSV and influenza rates and associated hospitalizations, children ages 66 and under may not visit patients in [facility] hospitals.  If the patient needs to stay at the hospital during part of their recovery, the visitor guidelines for inpatient rooms apply. Pre-op nurse will coordinate an appropriate time for 1 support person to accompany patient in pre-op.  This support person may not rotate.    Please refer to the [facility] website for the visitor guidelines for Inpatients (after your surgery is over and you are in a regular room).       Your procedure is scheduled on: 05/05/23   Report to [facility] Main Entrance    Report to admitting at 7:40 AM   Call this number if you have problems the morning of surgery [phone]   Do not eat food :After Midnight.   After Midnight you may have the following liquids until 7:10 AM DAY OF SURGERY  Water Non-Citrus Juices (without pulp, NO RED-Apple, White grape, White cranberry) Black Coffee (NO MILK/CREAM OR CREAMERS, sugar ok)  Clear Tea (NO MILK/CREAM OR CREAMERS, sugar ok) regular and decaf                             Plain Jell-O (NO RED)                                           Fruit ices (not with fruit pulp, NO RED)                                     Popsicles (NO RED)                                                               Sports drinks like Gatorade (NO RED)                     The day of surgery:  Drink ONE (1) Pre-Surgery Clear Ensure at 7 AM the morning of surgery. Drink in one sitting. Do not sip.  This drink was given to you during your hospital  pre-op appointment visit. Nothing else to drink after completing the Pre-Surgery Clear  Ensure           If you have questions, please contact your surgeon's office.   FOLLOW  ANY ADDITIONAL PRE OP INSTRUCTIONS YOU RECEIVED FROM YOUR SURGEON'S OFFICE!!!     Oral Hygiene is also important to reduce your risk of infection.                                    Remember - BRUSH YOUR TEETH THE MORNING OF SURGERY WITH YOUR  REGULAR TOOTHPASTE  DENTURES WILL BE REMOVED PRIOR TO SURGERY PLEASE DO NOT APPLY "Poly grip" OR ADHESIVES!!!   Do NOT smoke after Midnight   Take these medicines the morning of surgery with A SIP OF WATER:   Carvedilol  Omeprazole  Okay to use inhalers and nasal spray                              You may not have any metal on your body including  jewelry, and body piercing             Do not wear  lotions, powders, cologne, or deodorant              Men may shave face and neck.   Do not bring valuables to the hospital. [facility] IS NOT RESPONSIBLE   FOR VALUABLES.   Contacts, glasses, dentures or bridgework may not be worn into surgery.   Bring small overnight bag  DO NOT BRING YOUR HOME MEDICATIONS TO THE HOSPITAL. PHARMACY WILL DISPENSE MEDICATIONS LISTED ON YOUR MEDICATION LIST TO YOU DURING YOUR ADMISSION IN THE HOSPITAL!   Special Instructions: Bring a copy of your healthcare power of attorney and living will documents the day of surgery if you haven't scanned them before.              Please read over the following fact sheets you were given: IF YOU HAVE QUESTIONS ABOUT YOUR PRE-OP INSTRUCTIONS PLEASE CALL [phone] [place]   If you received a COVID test during your pre-op visit  it is requested that you wear a mask when out in public, stay away from anyone that may not be feeling well and notify your surgeon if you develop symptoms. If you test positive for Covid or have been in contact with anyone that has tested positive in the last 10 days please notify you surgeon.    Incentive Spirometer   An incentive spirometer is a tool that  can help keep your lungs clear and active. This tool measures how well you are filling your lungs with each breath. Taking long deep breaths may help reverse or decrease the chance of developing breathing (pulmonary) problems (especially infection) following: A long period of time when you are unable to move or be active. BEFORE THE PROCEDURE  If the spirometer includes an indicator to show your best effort, your nurse or respiratory therapist will set it to a desired goal. If possible, sit up straight or lean slightly forward. Try not to slouch. Hold the incentive spirometer in an upright position. INSTRUCTIONS FOR USE  Sit on the edge of your bed if possible, or sit up as far as you can in bed or on a chair. Hold the incentive spirometer in an upright position. Breathe out normally. Place the mouthpiece in your mouth and seal your lips tightly around it. Breathe in slowly and as deeply as possible, raising the piston or the ball toward the top of the column. Hold your breath for 3-5 seconds or for as long as possible. Allow the piston or ball to fall to the bottom of the column. Remove the mouthpiece from your mouth and breathe out normally. Rest for a few seconds and repeat Steps 1 through 7 at least 10 times every 1-2 hours when you are awake. Take your time and take a few normal breaths between deep breaths. The spirometer may include an indicator to show your best effort. Use  the indicator as a goal to work toward during each repetition. After each set of 10 deep breaths, practice coughing to be sure your lungs are clear. If you have an incision (the cut made at the time of surgery), support your incision when coughing by placing a pillow or rolled up towels firmly against it. Once you are able to get out of bed, walk around indoors and cough well. You may stop using the incentive spirometer when instructed by your caregiver.  RISKS AND COMPLICATIONS Take your time so you do not get dizzy or  light-headed. If you are in pain, you may need to take or ask for pain medication before doing incentive spirometry. It is harder to take a deep breath if you are having pain. AFTER USE Rest and breathe slowly and easily. It can be helpful to keep track of a log of your progress. Your caregiver can provide you with a simple table to help with this. If you are using the spirometer at home, follow these instructions: SEEK MEDICAL CARE IF:  You are having difficultly using the spirometer. You have trouble using the spirometer as often as instructed. Your pain medication is not giving enough relief while using the spirometer. You develop fever of 100.5 F (38.1 C) or higher. SEEK IMMEDIATE MEDICAL CARE IF:  You cough up bloody sputum that had not been present before. You develop fever of 102 F (38.9 C) or greater. You develop worsening pain at or near the incision site. MAKE SURE YOU:  Understand these instructions. Will watch your condition. Will get help right away if you are not doing well or get worse. Document Released: 04/19/2007 Document Revised: 02/29/2012 Document Reviewed: 06/20/2007 [facility] Patient Information 2014 [place], [place].   WHAT IS A BLOOD TRANSFUSION? Blood Transfusion Information  A transfusion is the replacement of blood or some of its parts. Blood is made up of multiple cells which provide different functions. Red blood cells carry oxygen and are used for blood loss replacement. White blood cells fight against infection. Platelets control bleeding. Plasma helps clot blood. Other blood products are available for specialized needs, such as hemophilia or other clotting disorders. BEFORE THE TRANSFUSION  Who gives blood for transfusions?  Healthy volunteers who are fully evaluated to make sure their blood is safe. This is blood bank blood. Transfusion therapy is the safest it has ever been in the practice of medicine. Before blood is taken from a donor, a  complete history is taken to make sure that person has no history of diseases nor engages in risky social behavior (examples are intravenous drug use or sexual activity with multiple partners). The donor's travel history is screened to minimize risk of transmitting infections, such as malaria. The donated blood is tested for signs of infectious diseases, such as HIV and hepatitis. The blood is then tested to be sure it is compatible with you in order to minimize the chance of a transfusion reaction. If you or a relative donates blood, this is often done in anticipation of surgery and is not appropriate for emergency situations. It takes many days to process the donated blood. RISKS AND COMPLICATIONS Although transfusion therapy is very safe and saves many lives, the main dangers of transfusion include:  Getting an infectious disease. Developing a transfusion reaction. This is an allergic reaction to something in the blood you were given. Every precaution is taken to prevent this. The decision to have a blood transfusion has been considered carefully by your caregiver before  blood is given. Blood is not given unless the benefits outweigh the risks. AFTER THE TRANSFUSION Right after receiving a blood transfusion, you will usually feel much better and more energetic. This is especially true if your red blood cells have gotten low (anemic). The transfusion raises the level of the red blood cells which carry oxygen, and this usually causes an energy increase. The nurse administering the transfusion will monitor you carefully for complications. HOME CARE INSTRUCTIONS  No special instructions are needed after a transfusion. You may find your energy is better. Speak with your caregiver about any limitations on activity for underlying diseases you may have. SEEK MEDICAL CARE IF:  Your condition is not improving after your transfusion. You develop redness or irritation at the intravenous (IV) site. SEEK  IMMEDIATE MEDICAL CARE IF:  Any of the following symptoms occur over the next 12 hours: Shaking chills. You have a temperature by mouth above 102 F (38.9 C), not controlled by medicine. Chest, back, or muscle pain. People around you feel you are not acting correctly or are confused. Shortness of breath or difficulty breathing. Dizziness and fainting. You get a rash or develop hives. You have a decrease in urine output. Your urine turns a dark color or changes to pink, red, or brown. Any of the following symptoms occur over the next 10 days: You have a temperature by mouth above 102 F (38.9 C), not controlled by medicine. Shortness of breath. Weakness after normal activity. The white part of the eye turns yellow (jaundice). You have a decrease in the amount of urine or are urinating less often. Your urine turns a dark color or changes to pink, red, or brown. Document Released: 12/04/2000 Document Revised: 02/29/2012 Document Reviewed: 07/23/2008 [facility] Patient Information 2014 [place], [place].

## 2023-04-16 ENCOUNTER — Ambulatory Visit: Payer: Medicare Other | Admitting: Cardiology

## 2023-04-16 ENCOUNTER — Encounter (HOSPITAL_COMMUNITY): Payer: Self-pay

## 2023-04-16 NOTE — Progress Notes (Addendum)
COVID Vaccine Completed:  Yes  Date of COVID positive in last 90 days:  PCP - Jarome Matin, MD (office note on chart) Cardiologist - Rollene Rotunda, MD Pulmonologist - Chilton Greathouse, MD  Medical clearance on chart dated 03-23-23 by Dr. Eloise Harman  Chest x-ray - Chest CT 09-04-22 Epic EKG - 03-10-23 Epic Stress Test -  ECHO - 03-25-23 Epic Cardiac Cath -  Pacemaker/ICD device last checked: Spinal Cord Stimulator:  Bowel Prep -   Sleep Study - Yes,  CPAP -   Fasting Blood Sugar -  Checks Blood Sugar _____ times a day  Last dose of GLP1 agonist-  N/A GLP1 instructions:  N/A   Last dose of SGLT-2 inhibitors-  N/A SGLT-2 instructions: N/A   Blood Thinner Instructions:  Time Aspirin Instructions:  ASA 81.  Hold 7 days Last Dose:  Activity level:  Can go up a flight of stairs and perform activities of daily living without stopping and without symptoms of chest pain or shortness of breath.  Able to exercise without symptoms  Unable to go up a flight of stairs without symptoms of     Anesthesia review:  CAD, HTN, COPD, LBBB, shortness of breath  Patient denies shortness of breath, fever, cough and chest pain at PAT appointment  Patient verbalized understanding of instructions that were given to them at the PAT appointment. Patient was also instructed that they will need to review over the PAT instructions again at home before surgery.

## 2023-04-19 ENCOUNTER — Telehealth (HOSPITAL_COMMUNITY): Payer: Self-pay | Admitting: Emergency Medicine

## 2023-04-19 NOTE — Telephone Encounter (Signed)
Reaching out to patient to offer assistance regarding upcoming cardiac imaging study; pt verbalizes understanding of appt date/time, parking situation and where to check in, pre-test NPO status and medications ordered, and verified current allergies; name and call back number provided for further questions should they arise Sanayah Munro RN Navigator Cardiac Imaging  Heart and Vascular [phone] office [phone] cell 

## 2023-04-20 ENCOUNTER — Other Ambulatory Visit: Payer: Self-pay

## 2023-04-20 ENCOUNTER — Ambulatory Visit (HOSPITAL_BASED_OUTPATIENT_CLINIC_OR_DEPARTMENT_OTHER)
Admission: RE | Admit: 2023-04-20 | Discharge: 2023-04-20 | Disposition: A | Discharge status: Home / Self Care | Payer: Medicare Other | Source: Ambulatory Visit | Attending: Cardiology | Admitting: Cardiology

## 2023-04-20 DIAGNOSIS — Z87442 Personal history of urinary calculi: Secondary | ICD-10-CM | POA: Diagnosis not present

## 2023-04-20 DIAGNOSIS — Z79899 Other long term (current) drug therapy: Secondary | ICD-10-CM | POA: Diagnosis not present

## 2023-04-20 DIAGNOSIS — K219 Gastro-esophageal reflux disease without esophagitis: Secondary | ICD-10-CM | POA: Diagnosis not present

## 2023-04-20 DIAGNOSIS — K2270 Barrett's esophagus without dysplasia: Secondary | ICD-10-CM | POA: Diagnosis not present

## 2023-04-20 DIAGNOSIS — I2510 Atherosclerotic heart disease of native coronary artery without angina pectoris: Secondary | ICD-10-CM | POA: Diagnosis not present

## 2023-04-20 DIAGNOSIS — J939 Pneumothorax, unspecified: Secondary | ICD-10-CM | POA: Diagnosis not present

## 2023-04-20 DIAGNOSIS — Q2112 Patent foramen ovale: Secondary | ICD-10-CM | POA: Diagnosis not present

## 2023-04-20 DIAGNOSIS — Z8249 Family history of ischemic heart disease and other diseases of the circulatory system: Secondary | ICD-10-CM | POA: Diagnosis not present

## 2023-04-20 DIAGNOSIS — Z902 Acquired absence of lung [part of]: Secondary | ICD-10-CM | POA: Diagnosis not present

## 2023-04-20 DIAGNOSIS — R0602 Shortness of breath: Secondary | ICD-10-CM | POA: Diagnosis not present

## 2023-04-20 DIAGNOSIS — I472 Ventricular tachycardia, unspecified: Secondary | ICD-10-CM | POA: Diagnosis not present

## 2023-04-20 DIAGNOSIS — Z8711 Personal history of peptic ulcer disease: Secondary | ICD-10-CM | POA: Diagnosis not present

## 2023-04-20 DIAGNOSIS — Z87891 Personal history of nicotine dependence: Secondary | ICD-10-CM | POA: Diagnosis not present

## 2023-04-20 DIAGNOSIS — I440 Atrioventricular block, first degree: Secondary | ICD-10-CM | POA: Diagnosis present

## 2023-04-20 DIAGNOSIS — Y838 Other surgical procedures as the cause of abnormal reaction of the patient, or of later complication, without mention of misadventure at the time of the procedure: Secondary | ICD-10-CM | POA: Diagnosis present

## 2023-04-20 DIAGNOSIS — I319 Disease of pericardium, unspecified: Secondary | ICD-10-CM | POA: Diagnosis not present

## 2023-04-20 DIAGNOSIS — Z86010 Personal history of colonic polyps: Secondary | ICD-10-CM | POA: Diagnosis not present

## 2023-04-20 DIAGNOSIS — E8770 Fluid overload, unspecified: Secondary | ICD-10-CM | POA: Diagnosis not present

## 2023-04-20 DIAGNOSIS — I200 Unstable angina: Secondary | ICD-10-CM | POA: Diagnosis not present

## 2023-04-20 DIAGNOSIS — Z01810 Encounter for preprocedural cardiovascular examination: Secondary | ICD-10-CM | POA: Diagnosis not present

## 2023-04-20 DIAGNOSIS — D62 Acute posthemorrhagic anemia: Secondary | ICD-10-CM | POA: Diagnosis not present

## 2023-04-20 DIAGNOSIS — I119 Hypertensive heart disease without heart failure: Secondary | ICD-10-CM | POA: Diagnosis not present

## 2023-04-20 DIAGNOSIS — I252 Old myocardial infarction: Secondary | ICD-10-CM | POA: Diagnosis not present

## 2023-04-20 DIAGNOSIS — Z1152 Encounter for screening for COVID-19: Secondary | ICD-10-CM | POA: Diagnosis not present

## 2023-04-20 DIAGNOSIS — Z951 Presence of aortocoronary bypass graft: Secondary | ICD-10-CM | POA: Diagnosis not present

## 2023-04-20 DIAGNOSIS — I25110 Atherosclerotic heart disease of native coronary artery with unstable angina pectoris: Secondary | ICD-10-CM | POA: Diagnosis not present

## 2023-04-20 DIAGNOSIS — E785 Hyperlipidemia, unspecified: Secondary | ICD-10-CM | POA: Diagnosis not present

## 2023-04-20 DIAGNOSIS — J9811 Atelectasis: Secondary | ICD-10-CM | POA: Diagnosis not present

## 2023-04-20 DIAGNOSIS — T82855A Stenosis of coronary artery stent, initial encounter: Secondary | ICD-10-CM | POA: Diagnosis not present

## 2023-04-20 DIAGNOSIS — J449 Chronic obstructive pulmonary disease, unspecified: Secondary | ICD-10-CM | POA: Diagnosis not present

## 2023-04-20 DIAGNOSIS — I088 Other rheumatic multiple valve diseases: Secondary | ICD-10-CM | POA: Diagnosis not present

## 2023-04-20 DIAGNOSIS — Z9221 Personal history of antineoplastic chemotherapy: Secondary | ICD-10-CM | POA: Diagnosis not present

## 2023-04-20 DIAGNOSIS — Z7982 Long term (current) use of aspirin: Secondary | ICD-10-CM | POA: Diagnosis not present

## 2023-04-20 DIAGNOSIS — E782 Mixed hyperlipidemia: Secondary | ICD-10-CM | POA: Diagnosis not present

## 2023-04-20 DIAGNOSIS — I10 Essential (primary) hypertension: Secondary | ICD-10-CM | POA: Diagnosis not present

## 2023-04-20 DIAGNOSIS — J90 Pleural effusion, not elsewhere classified: Secondary | ICD-10-CM | POA: Diagnosis not present

## 2023-04-20 DIAGNOSIS — D696 Thrombocytopenia, unspecified: Secondary | ICD-10-CM | POA: Diagnosis not present

## 2023-04-20 LAB — NM PET CT CARDIAC PERFUSION MULTI W/ABSOLUTE BLOODFLOW
LV dias vol: 174 mL (ref 62–150)
LV sys vol: 98 mL
MBFR: 0.68
Nuc Rest EF: 48 %
Nuc Stress EF: 44 %
Peak HR: 63 {beats}/min
Rest HR: 54 {beats}/min
Rest MBF: 0.75 ml/g/min
Rest Nuclear Isotope Dose: 22.7 mCi
SRS: 1
SSS: 37
Stress MBF: 0.51 ml/g/min
Stress Nuclear Isotope Dose: 22.7 mCi
TID: 1.2

## 2023-04-20 MED ORDER — DEXTROSE 5 % IV SOLN
INTRAVENOUS | Status: AC
  Filled 2023-04-20: qty 50

## 2023-04-20 MED ORDER — RUBIDIUM RB82 GENERATOR (RUBYFILL)
22.7000 | PACK | Freq: Once | INTRAVENOUS | Status: AC
  Administered 2023-04-20: 22.7 via INTRAVENOUS

## 2023-04-20 MED ORDER — CAFFEINE CITRATE BASE COMPONENT 10 MG/ML IV SOLN
INTRAVENOUS | Status: AC
  Filled 2023-04-20: qty 3

## 2023-04-20 MED ORDER — REGADENOSON 0.4 MG/5ML IV SOLN
INTRAVENOUS | Status: AC
  Filled 2023-04-20: qty 5

## 2023-04-20 MED ORDER — REGADENOSON 0.4 MG/5ML IV SOLN
0.4000 mg | Freq: Once | INTRAVENOUS | Status: AC
  Administered 2023-04-20: 0.4 mg via INTRAVENOUS

## 2023-04-20 MED ORDER — NITROGLYCERIN 0.4 MG SL SUBL: 0.4000 mg | SUBLINGUAL_TABLET | SUBLINGUAL | 5 refills | Status: DC | PRN

## 2023-04-20 NOTE — Progress Notes (Signed)
Pt tolerated scan well.  VS WNL

## 2023-04-21 ENCOUNTER — Ambulatory Visit: Payer: Medicare Other | Attending: Cardiology | Admitting: Cardiology

## 2023-04-21 ENCOUNTER — Encounter: Payer: Self-pay | Admitting: Cardiology

## 2023-04-21 ENCOUNTER — Encounter: Payer: Self-pay | Admitting: *Deleted

## 2023-04-21 ENCOUNTER — Other Ambulatory Visit: Payer: Self-pay | Admitting: *Deleted

## 2023-04-21 VITALS — Ht 68.5 in | Wt 191.0 lb

## 2023-04-21 DIAGNOSIS — I200 Unstable angina: Secondary | ICD-10-CM

## 2023-04-21 DIAGNOSIS — I2510 Atherosclerotic heart disease of native coronary artery without angina pectoris: Secondary | ICD-10-CM

## 2023-04-21 MED ORDER — SODIUM CHLORIDE 0.9% FLUSH
3.0000 mL | Freq: Two times a day (BID) | INTRAVENOUS | Status: DC
Start: 2023-04-21 — End: 2023-05-02

## 2023-04-21 NOTE — H&P (View-Only) (Signed)
 Virtual Visit via Telephone Note   Because of Daniel Colon's co-morbid illnesses, he is at least at moderate risk for complications without adequate follow up.  This format is felt to be most appropriate for this patient at this time.  The patient did not have access to video technology/had technical difficulties with video requiring transitioning to audio format only (telephone).  All issues noted in this document were discussed and addressed.  No physical exam could be performed with this format.  Please refer to the patient's chart for his consent to telehealth for [facility].    Date:  04/21/2023   ID:  Daniel Colon, DOB 11/16/1946, MRN [id number] The patient was identified using 2 identifiers.  Patient Location: Home Provider Location: Office/Clinic   PCP:  Paterson, Daniel G, MD   [facility] Providers Cardiologist:  None {   Evaluation Performed:  Follow-Up Visit  Chief Complaint:  SOB  History of Present Illness:    Daniel Colon is a 76 y.o. male with  who presents for follow up of  CAD, s/p stent to RCA in 1996 in [place].  Routine ETT(11/13/13) was abnormal with early ST changes to suggest ischemia. He was set up had LHC (11/17/13):  pLAD 30-40, D1 30, mLAD 70-75, mCFX 70, mRCA stent 40 (ISR), EF 55-65%.   He was managed medically.  He returns for follow up. He had lung right upper lobe resection.  He was treated with chemotherapy.    I saw him recently prior to getting orthopedic surgery.  EKG demonstrated new anterior T wave inversion.  Echo showed no new wall motion abnormalities.  However, he has had increased SOB.  PET done yesterday was high risk as below.     Past Medical History:  Diagnosis Date   Allergy    Arthritis    Barrett's esophagus    CAD (coronary artery disease)    a. Stent to the RCA 1996 (in [place]);  b. ETT(11/13/13): abnormal with early ST changes to suggest ischemia;   c. LHC (11/17/13):  pLAD 30-40, D1 30, mLAD 70-75,  mCFX 70, mRCA stent 40 (ISR), EF 55-65%.  Medical Rx   Cataract    left eye removed, right immature   Colon polyps    Dyslipidemia    GERD (gastroesophageal reflux disease)    History of kidney stones    Hyperlipidemia    Hypertension    Hypogonadism in male    Myocardial infarction (HCC) 1996   Nephrolithiasis    kidney stone   NSCL Ca dx'd 12/2019   Peptic ulcer disease    PUD (peptic ulcer disease)    Past Surgical History:  Procedure Laterality Date   ANGIOPLASTY     stent - rt cor. art   BILROTH II PROCEDURE     CARDIAC CATHETERIZATION     CIRCUMCISION     COLONOSCOPY     INTERCOSTAL NERVE BLOCK Right 01/24/2020   Procedure: Intercostal Nerve Block;  Surgeon: Gerhardt, Edward B, MD;  Location: [facility] OR;  Service: Thoracic;  Laterality: Right;   KNEE ARTHROSCOPY Left    LEFT HEART CATHETERIZATION WITH CORONARY ANGIOGRAM N/A 11/17/2013   Procedure: LEFT HEART CATHETERIZATION WITH CORONARY ANGIOGRAM;  Surgeon: Michael D Cooper, MD;  Location: [facility] CATH LAB;  Service: Cardiovascular;  Laterality: N/A;   LUNG LOBECTOMY  01/2020   had chemo, no radtion   LYMPH NODE DISSECTION N/A 01/24/2020   Procedure: LYMPH NODE DISSECTION;  Surgeon: Gerhardt, Edward B,   MD;  Location: [facility] OR;  Service: Thoracic;  Laterality: N/A;   NECK LESION BIOPSY     POLYPECTOMY     SKIN BIOPSY     Back   Thigh surgery Left    Infant   ulcer surgery     ULNAR NERVE TRANSPOSITION Right 08/05/2017   Procedure: RIGHT ULNAR NERVE DECOMPRESSION;  Surgeon: Kuzma, Kevin, MD;  Location: [facility];  Service: Orthopedics;  Laterality: Right;   UPPER GI ENDOSCOPY     VIDEO BRONCHOSCOPY N/A 01/24/2020   Procedure: VIDEO BRONCHOSCOPY;  Surgeon: Gerhardt, Edward B, MD;  Location: [facility] OR;  Service: Thoracic;  Laterality: N/A;   WISDOM TOOTH EXTRACTION       Prior to Admission medications   Medication Sig Start Date End Date Taking? Authorizing Provider  albuterol (VENTOLIN HFA) 108 (90 Base) MCG/ACT  inhaler Inhale 2 puffs into the lungs every 6 (six) hours as needed for wheezing or shortness of breath. 11/19/20  Yes [provider]  aspirin EC 81 MG tablet Take 81 mg by mouth 2 (two) times a week. In the evening on Monday and Thursday   Yes [provider]  atorvastatin (LIPITOR) 40 MG tablet TAKE 1 TABLET DAILY Patient taking differently: Take 40 mg by mouth at bedtime. 02/03/23  Yes Sydne Krahl, MD  bisacodyl (DULCOLAX) 5 MG EC tablet Take 5 mg by mouth daily as needed for moderate constipation.   Yes [provider]  BREZTRI AEROSPHERE 160-9-4.8 MCG/ACT AERO Inhale 2 puffs into the lungs in the morning and at bedtime. 06/25/20  Yes [provider]  carvedilol (COREG) 12.5 MG tablet Take 12.5 mg by mouth at bedtime.   Yes [provider]  Coenzyme Q10 (CO Q-10) 200 MG CAPS Take 200 mg by mouth at bedtime.   Yes [provider]  docusate sodium (COLACE) 100 MG capsule Take 100 mg by mouth at bedtime.   Yes [provider]  ezetimibe (ZETIA) 10 MG tablet Take 10 mg by mouth at bedtime.   Yes [provider]  fish oil-omega-3 fatty acids 1000 MG capsule Take 1,000 mg by mouth at bedtime.   Yes [provider]  fluorouracil (EFUDEX) 5 % cream Apply 1 Application topically daily as needed (rash). 11/04/22  Yes [provider]  GNP IRON 200 (65 Fe) MG TABS Take 200 mg by mouth at bedtime. 04/27/22  Yes [provider]  MICARDIS HCT 80-12.5 MG tablet TAKE 1 TABLET DAILY Patient taking differently: Take 1 tablet by mouth at bedtime. 08/10/22  Yes Ellenora Talton, MD  Multiple Vitamin (MULTIVITAMIN WITH MINERALS) TABS tablet Take 1 tablet by mouth daily.   Yes [provider]  nitroGLYCERIN (NITROSTAT) 0.4 MG SL tablet Place 1 tablet (0.4 mg total) under the tongue every 5 (five) minutes as needed for chest pain. 04/20/23 07/19/23 Yes Juanmiguel Defelice, MD  omeprazole (PRILOSEC) 10 MG capsule Take 10  mg by mouth at bedtime.   Yes [provider]  polyethylene glycol (MIRALAX / GLYCOLAX) 17 g packet Take 17 g by mouth daily as needed for mild constipation.   Yes [provider]  senna (SENOKOT) 8.6 MG tablet Take 1 tablet by mouth at bedtime.   Yes [provider]  sildenafil (VIAGRA) 100 MG tablet Take 100 mg by mouth daily as needed for erectile dysfunction. 12/23/21  Yes [provider]  VITAMIN B COMPLEX-C PO Take 1 tablet by mouth at bedtime.   Yes [provider]      Allergies:   Patient has no known allergies.   Social History   Tobacco Use   Smoking status: Former    Packs/day: 1.00    Years: 42.00    Additional pack years: 0.00    Total pack years: 42.00    Types: Cigarettes    Quit date: 06/21/2011    Years since quitting: 11.8   Smokeless tobacco: Former    Types: Chew  Vaping Use   Vaping Use: Never used  Substance Use Topics   Alcohol use: Yes    Alcohol/week: 0.0 standard drinks of alcohol    Comment: social   Drug use: No     Family Hx: The patient's family history includes CAD (age of onset: 40) in his sister; CAD (age of onset: 50) in his brother and sister; Cancer in his brother; Liver disease in an other family member; Sudden death (age of onset: 59) in his mother. There is no history of Colon cancer, Colon polyps, Esophageal cancer, Rectal cancer, or Stomach cancer.  ROS:   Please see the history of present illness.     All other systems reviewed and are negative.   Prior CV studies:   The following studies were reviewed today:  ECHO  03/25/2023 1. Left ventricular ejection fraction, by estimation, is 55 to 60%. The  left ventricle has normal function. The left ventricle has no regional  wall motion abnormalities. There is mild concentric left ventricular  hypertrophy. Left ventricular diastolic  parameters were normal.   2. Right ventricular systolic function is normal. The right ventricular  size is  normal.   3. Left atrial size was mildly dilated.   4. Right atrial size was mildly dilated.   5. The mitral valve is normal in structure. Trivial mitral valve  regurgitation. No evidence of mitral stenosis.   6. The aortic valve is tricuspid. There is mild calcification of the  aortic valve. Aortic valve regurgitation is trivial. No aortic stenosis is  present.   7. Aortic dilatation noted. There is borderline dilatation of the  ascending aorta, measuring 38 mm.   8. The inferior vena cava is normal in size with greater than 50%  respiratory variability, suggesting right atrial pressure of 3 mmHg.    PET 04/20/2023   Findings are consistent with ischemia. The study is high risk.   Findings in combination that are high risk include decrease in EF with stress, TID, large perfusion defect, no augmentation of CFR with stress. Recommend cardiac catheterization if clinically appropriate. Critical findings discussed with Dr. Malie Kashani 04/20/23 at 15:10 and acknowledged.   LV perfusion is abnormal. There is evidence of ischemia. There is no evidence of infarction. Defect 1: There is a large defect with severe reduction in uptake present in the apical to basal location(s) that is reversible, primarily involving the septal, anterior and lateral walls from apex to mid, and the anteroseptal and inferolateral walls at the base. There is abnormal wall motion in the defect area. Consistent with ischemia.   Rest left ventricular function is abnormal. Rest global function is mildly reduced. Rest EF: 48 %. Stress left ventricular function is abnormal. Stress global function is mildly reduced. Stress EF: 44 %. End diastolic cavity size is moderately enlarged. End systolic cavity size is moderately enlarged. Evidence of transient ischemic dilation (TID) noted. TID was appreciated visually and quantitatively.   Myocardial blood flow was computed to be 0.75ml/g/min at rest and 0.51ml/g/min at stress. Global myocardial  blood flow reserve was 0.68 and   was highly abnormal.   Coronary calcium assessment not performed due to prior revascularization.   Labs/Other Tests and Data Reviewed:    EKG:  No ECG reviewed.  Recent Labs: 09/04/2022: ALT 36; BUN 27; Creatinine 0.79; Hemoglobin 13.4; Platelet Count 173; Potassium 4.4; Sodium 139   Recent Lipid Panel No results found for: "CHOL", "TRIG", "HDL", "CHOLHDL", "LDLCALC", "LDLDIRECT"  Wt Readings from Last 3 Encounters:  04/21/23 191 lb (86.6 kg)  03/10/23 193 lb (87.5 kg)  10/12/22 199 lb 6.4 oz (90.4 kg)     Risk Assessment/Calculations:         Objective:    Vital Signs:  Ht 5' 8.5" (1.74 m)   Wt 191 lb (86.6 kg)   BMI 28.62 kg/m     ASSESSMENT & PLAN:    CAD:  High risk finding on PET as above.  Cath is indicated with this, EKG changes and SOB.  The patient understands that risks included but are not limited to stroke (1 in 1000), death (1 in 1000), kidney failure [usually temporary] (1 in 500), bleeding (1 in 200), allergic reaction [possibly serious] (1 in 200).  The patient understands and agrees to proceed.    DYSLIPIDEMIA:  LDL was 53.  Continue current therapy.    HTN:  Continue current   Shared Decision Making/Informed Consent The risks [stroke (1 in 1000), death (1 in 1000), kidney failure [usually temporary] (1 in 500), bleeding (1 in 200), allergic reaction [possibly serious] (1 in 200)], benefits (diagnostic support and management of coronary artery disease) and alternatives of a cardiac catheterization were discussed in detail with Daniel Colon and he is willing to proceed.    Time:   Today, I have spent 16 minutes with the patient with telehealth technology discussing the above problems.     Medication Adjustments/Labs and Tests Ordered: Current medicines are reviewed at length with the patient today.  Concerns regarding medicines are outlined above.   Tests Ordered: No orders of the defined types were placed in this  encounter.   Medication Changes: No orders of the defined types were placed in this encounter.   Follow Up:  In Person in 1 month(s)  Signed, Valinda Fedie, MD  04/21/2023 11:36 AM    [facility]  

## 2023-04-21 NOTE — Progress Notes (Signed)
Virtual Visit via Telephone Note   Because of Daniel Colon co-morbid illnesses, he is at least at moderate risk for complications without adequate follow up.  This format is felt to be most appropriate for this patient at this time.  The patient did not have access to video technology/had technical difficulties with video requiring transitioning to audio format only (telephone).  All issues noted in this document were discussed and addressed.  No physical exam could be performed with this format.  Please refer to the patient's chart for his consent to telehealth for [facility].    Date:  04/21/2023   ID:  Daniel Colon, DOB [birth year], MRN [id number] The patient was identified using 2 identifiers.  Patient Location: Home Provider Location: Office/Clinic   PCP:  Garlan Fillers, MD   [facility] Providers Cardiologist:  None {   Evaluation Performed:  Follow-Up Visit  Chief Complaint:  SOB  History of Present Illness:    Daniel Colon is a 77 y.o. male with  who presents for follow up of  CAD, s/p stent to RCA in 1996 in [place].  Routine ETT(11/13/13) was abnormal with early ST changes to suggest ischemia. He was set up had LHC (11/17/13):  pLAD 30-40, D1 30, mLAD 70-75, mCFX 70, mRCA stent 40 (ISR), EF 55-65%.   He was managed medically.  He returns for follow up. He had lung right upper lobe resection.  He was treated with chemotherapy.    I saw him recently prior to getting orthopedic surgery.  EKG demonstrated new anterior T wave inversion.  Echo showed no new wall motion abnormalities.  However, he has had increased SOB.  PET done yesterday was high risk as below.     Past Medical History:  Diagnosis Date   Allergy    Arthritis    Barrett's esophagus    CAD (coronary artery disease)    a. Stent to the RCA 1996 (in [place]);  b. ETT(11/13/13): abnormal with early ST changes to suggest ischemia;   c. LHC (11/17/13):  pLAD 30-40, D1 30, mLAD 70-75,  mCFX 70, mRCA stent 40 (ISR), EF 55-65%.  Medical Rx   Cataract    left eye removed, right immature   Colon polyps    Dyslipidemia    GERD (gastroesophageal reflux disease)    History of kidney stones    Hyperlipidemia    Hypertension    Hypogonadism in male    Myocardial infarction [facility]) 1996   Nephrolithiasis    kidney stone   NSCL Ca dx'd 12/2019   Peptic ulcer disease    PUD (peptic ulcer disease)    Past Surgical History:  Procedure Laterality Date   ANGIOPLASTY     stent - rt cor. art   BILROTH II PROCEDURE     CARDIAC CATHETERIZATION     CIRCUMCISION     COLONOSCOPY     INTERCOSTAL NERVE BLOCK Right 01/24/2020   Procedure: Intercostal Nerve Block;  Surgeon: Delight Ovens, MD;  Location: [facility] OR;  Service: Thoracic;  Laterality: Right;   KNEE ARTHROSCOPY Left    LEFT HEART CATHETERIZATION WITH CORONARY ANGIOGRAM N/A 11/17/2013   Procedure: LEFT HEART CATHETERIZATION WITH CORONARY ANGIOGRAM;  Surgeon: Micheline Chapman, MD;  Location: [facility] CATH LAB;  Service: Cardiovascular;  Laterality: N/A;   LUNG LOBECTOMY  01/2020   had chemo, no radtion   LYMPH NODE DISSECTION N/A 01/24/2020   Procedure: LYMPH NODE DISSECTION;  Surgeon: Delight Ovens,  MD;  Location: [facility] OR;  Service: Thoracic;  Laterality: N/A;   NECK LESION BIOPSY     POLYPECTOMY     SKIN BIOPSY     Back   Thigh surgery Left    Infant   ulcer surgery     ULNAR NERVE TRANSPOSITION Right 08/05/2017   Procedure: RIGHT ULNAR NERVE DECOMPRESSION;  Surgeon: Betha Loa, MD;  Location: [facility];  Service: Orthopedics;  Laterality: Right;   UPPER GI ENDOSCOPY     VIDEO BRONCHOSCOPY N/A 01/24/2020   Procedure: VIDEO BRONCHOSCOPY;  Surgeon: Delight Ovens, MD;  Location: [facility] OR;  Service: Thoracic;  Laterality: N/A;   WISDOM TOOTH EXTRACTION       Prior to Admission medications   Medication Sig Start Date End Date Taking? Authorizing Provider  albuterol (VENTOLIN HFA) 108 (90 Base) MCG/ACT  inhaler Inhale 2 puffs into the lungs every 6 (six) hours as needed for wheezing or shortness of breath. 11/19/20  Yes [provider]  aspirin EC 81 MG tablet Take 81 mg by mouth 2 (two) times a week. In the evening on Monday and Thursday   Yes [provider]  atorvastatin (LIPITOR) 40 MG tablet TAKE 1 TABLET DAILY Patient taking differently: Take 40 mg by mouth at bedtime. 02/03/23  Yes Rollene Rotunda, MD  bisacodyl (DULCOLAX) 5 MG EC tablet Take 5 mg by mouth daily as needed for moderate constipation.   Yes [provider]  BREZTRI AEROSPHERE 160-9-4.8 MCG/ACT AERO Inhale 2 puffs into the lungs in the morning and at bedtime. 06/25/20  Yes [provider]  carvedilol (COREG) 12.5 MG tablet Take 12.5 mg by mouth at bedtime.   Yes [provider]  Coenzyme Q10 (CO Q-10) 200 MG CAPS Take 200 mg by mouth at bedtime.   Yes [provider]  docusate sodium (COLACE) 100 MG capsule Take 100 mg by mouth at bedtime.   Yes [provider]  ezetimibe (ZETIA) 10 MG tablet Take 10 mg by mouth at bedtime.   Yes [provider]  fish oil-omega-3 fatty acids 1000 MG capsule Take 1,000 mg by mouth at bedtime.   Yes [provider]  fluorouracil (EFUDEX) 5 % cream Apply 1 Application topically daily as needed (rash). 11/04/22  Yes [provider]  GNP IRON 200 (65 Fe) MG TABS Take 200 mg by mouth at bedtime. 04/27/22  Yes [provider]  MICARDIS HCT 80-12.5 MG tablet TAKE 1 TABLET DAILY Patient taking differently: Take 1 tablet by mouth at bedtime. 08/10/22  Yes Rollene Rotunda, MD  Multiple Vitamin (MULTIVITAMIN WITH MINERALS) TABS tablet Take 1 tablet by mouth daily.   Yes [provider]  nitroGLYCERIN (NITROSTAT) 0.4 MG SL tablet Place 1 tablet (0.4 mg total) under the tongue every 5 (five) minutes as needed for chest pain. 04/20/23 07/19/23 Yes Rollene Rotunda, MD  omeprazole (PRILOSEC) 10 MG capsule Take 10  mg by mouth at bedtime.   Yes [provider]  polyethylene glycol (MIRALAX / GLYCOLAX) 17 g packet Take 17 g by mouth daily as needed for mild constipation.   Yes [provider]  senna (SENOKOT) 8.6 MG tablet Take 1 tablet by mouth at bedtime.   Yes [provider]  sildenafil (VIAGRA) 100 MG tablet Take 100 mg by mouth daily as needed for erectile dysfunction. 12/23/21  Yes [provider]  VITAMIN B COMPLEX-C PO Take 1 tablet by mouth at bedtime.   Yes [provider]  Allergies:   Patient has no known allergies.   Social History   Tobacco Use   Smoking status: Former    Packs/day: 1.00    Years: 42.00    Additional pack years: 0.00    Total pack years: 42.00    Types: Cigarettes    Quit date: 06/21/2011    Years since quitting: 11.8   Smokeless tobacco: Former    Types: Associate Professor Use: Never used  Substance Use Topics   Alcohol use: Yes    Alcohol/week: 0.0 standard drinks of alcohol    Comment: social   Drug use: No     Family Hx: The patient's family history includes CAD (age of onset: 52) in his sister; CAD (age of onset: 10) in his brother and sister; Cancer in his brother; Liver disease in an other family member; Sudden death (age of onset: 36) in his mother. There is no history of Colon cancer, Colon polyps, Esophageal cancer, Rectal cancer, or Stomach cancer.  ROS:   Please see the history of present illness.     All other systems reviewed and are negative.   Prior CV studies:   The following studies were reviewed today:  ECHO  03/25/2023 1. Left ventricular ejection fraction, by estimation, is 55 to 60%. The  left ventricle has normal function. The left ventricle has no regional  wall motion abnormalities. There is mild concentric left ventricular  hypertrophy. Left ventricular diastolic  parameters were normal.   2. Right ventricular systolic function is normal. The right ventricular  size is  normal.   3. Left atrial size was mildly dilated.   4. Right atrial size was mildly dilated.   5. The mitral valve is normal in structure. Trivial mitral valve  regurgitation. No evidence of mitral stenosis.   6. The aortic valve is tricuspid. There is mild calcification of the  aortic valve. Aortic valve regurgitation is trivial. No aortic stenosis is  present.   7. Aortic dilatation noted. There is borderline dilatation of the  ascending aorta, measuring 38 mm.   8. The inferior vena cava is normal in size with greater than 50%  respiratory variability, suggesting right atrial pressure of 3 mmHg.    PET 04/20/2023   Findings are consistent with ischemia. The study is high risk.   Findings in combination that are high risk include decrease in EF with stress, TID, large perfusion defect, no augmentation of CFR with stress. Recommend cardiac catheterization if clinically appropriate. Critical findings discussed with Dr. Antoine Poche 04/20/23 at 15:10 and acknowledged.   LV perfusion is abnormal. There is evidence of ischemia. There is no evidence of infarction. Defect 1: There is a large defect with severe reduction in uptake present in the apical to basal location(s) that is reversible, primarily involving the septal, anterior and lateral walls from apex to mid, and the anteroseptal and inferolateral walls at the base. There is abnormal wall motion in the defect area. Consistent with ischemia.   Rest left ventricular function is abnormal. Rest global function is mildly reduced. Rest EF: 48 %. Stress left ventricular function is abnormal. Stress global function is mildly reduced. Stress EF: 44 %. End diastolic cavity size is moderately enlarged. End systolic cavity size is moderately enlarged. Evidence of transient ischemic dilation (TID) noted. TID was appreciated visually and quantitatively.   Myocardial blood flow was computed to be 0.68ml/g/min at rest and 0.44ml/g/min at stress. Global myocardial  blood flow reserve was 0.68 and  was highly abnormal.   Coronary calcium assessment not performed due to prior revascularization.   Labs/Other Tests and Data Reviewed:    EKG:  No ECG reviewed.  Recent Labs: 09/04/2022: ALT 36; BUN 27; Creatinine 0.79; Hemoglobin 13.4; Platelet Count 173; Potassium 4.4; Sodium 139   Recent Lipid Panel No results found for: "CHOL", "TRIG", "HDL", "CHOLHDL", "LDLCALC", "LDLDIRECT"  Wt Readings from Last 3 Encounters:  04/21/23 191 lb (86.6 kg)  03/10/23 193 lb (87.5 kg)  10/12/22 199 lb 6.4 oz (90.4 kg)     Risk Assessment/Calculations:         Objective:    Vital Signs:  Ht 5' 8.5" (1.74 m)   Wt 191 lb (86.6 kg)   BMI 28.62 kg/m     ASSESSMENT & PLAN:    CAD:  High risk finding on PET as above.  Cath is indicated with this, EKG changes and SOB.  The patient understands that risks included but are not limited to stroke (1 in 1000), death (1 in 1000), kidney failure [usually temporary] (1 in 500), bleeding (1 in 200), allergic reaction [possibly serious] (1 in 200).  The patient understands and agrees to proceed.    DYSLIPIDEMIA:  LDL was 53.  Continue current therapy.    HTN:  Continue current   Shared Decision Making/Informed Consent The risks [stroke (1 in 1000), death (1 in 1000), kidney failure [usually temporary] (1 in 500), bleeding (1 in 200), allergic reaction [possibly serious] (1 in 200)], benefits (diagnostic support and management of coronary artery disease) and alternatives of a cardiac catheterization were discussed in detail with Mr. Hogen and he is willing to proceed.    Time:   Today, I have spent 16 minutes with the patient with telehealth technology discussing the above problems.     Medication Adjustments/Labs and Tests Ordered: Current medicines are reviewed at length with the patient today.  Concerns regarding medicines are outlined above.   Tests Ordered: No orders of the defined types were placed in this  encounter.   Medication Changes: No orders of the defined types were placed in this encounter.   Follow Up:  In Person in 1 month(s)  Signed, Rollene Rotunda, MD  04/21/2023 11:36 AM    [facility]

## 2023-04-22 ENCOUNTER — Other Ambulatory Visit: Payer: Self-pay

## 2023-04-22 ENCOUNTER — Inpatient Hospital Stay (HOSPITAL_COMMUNITY)
Admission: AD | Admit: 2023-04-22 | Discharge: 2023-05-02 | DRG: 234 | Disposition: A | Discharge status: Home Health | Payer: Medicare Other | Attending: Thoracic Surgery (Cardiothoracic Vascular Surgery) | Admitting: Thoracic Surgery (Cardiothoracic Vascular Surgery)

## 2023-04-22 ENCOUNTER — Encounter (HOSPITAL_COMMUNITY)
Admission: AD | Disposition: A | Discharge status: Home / Self Care | Payer: Self-pay | Source: Home / Self Care | Attending: Cardiovascular Disease

## 2023-04-22 ENCOUNTER — Encounter (HOSPITAL_COMMUNITY)
Admission: RE | Admit: 2023-04-22 | Discharge: 2023-04-22 | Disposition: A | Discharge status: Home / Self Care | Payer: Medicare Other | Source: Ambulatory Visit | Attending: Orthopedic Surgery | Admitting: Orthopedic Surgery

## 2023-04-22 ENCOUNTER — Encounter (HOSPITAL_COMMUNITY): Payer: Self-pay

## 2023-04-22 VITALS — BP 152/69 | HR 53 | Temp 97.8°F | Resp 20 | Ht 68.5 in | Wt 190.0 lb

## 2023-04-22 DIAGNOSIS — R001 Bradycardia, unspecified: Secondary | ICD-10-CM | POA: Insufficient documentation

## 2023-04-22 DIAGNOSIS — Z9221 Personal history of antineoplastic chemotherapy: Secondary | ICD-10-CM | POA: Diagnosis not present

## 2023-04-22 DIAGNOSIS — Z8249 Family history of ischemic heart disease and other diseases of the circulatory system: Secondary | ICD-10-CM | POA: Diagnosis not present

## 2023-04-22 DIAGNOSIS — Z1152 Encounter for screening for COVID-19: Secondary | ICD-10-CM | POA: Diagnosis not present

## 2023-04-22 DIAGNOSIS — I319 Disease of pericardium, unspecified: Secondary | ICD-10-CM | POA: Diagnosis present

## 2023-04-22 DIAGNOSIS — D696 Thrombocytopenia, unspecified: Secondary | ICD-10-CM | POA: Diagnosis not present

## 2023-04-22 DIAGNOSIS — Z01812 Encounter for preprocedural laboratory examination: Principal | ICD-10-CM

## 2023-04-22 DIAGNOSIS — Z79899 Other long term (current) drug therapy: Secondary | ICD-10-CM | POA: Diagnosis not present

## 2023-04-22 DIAGNOSIS — Z902 Acquired absence of lung [part of]: Secondary | ICD-10-CM

## 2023-04-22 DIAGNOSIS — Y838 Other surgical procedures as the cause of abnormal reaction of the patient, or of later complication, without mention of misadventure at the time of the procedure: Secondary | ICD-10-CM | POA: Diagnosis present

## 2023-04-22 DIAGNOSIS — I252 Old myocardial infarction: Secondary | ICD-10-CM | POA: Diagnosis not present

## 2023-04-22 DIAGNOSIS — T82855A Stenosis of coronary artery stent, initial encounter: Secondary | ICD-10-CM | POA: Diagnosis present

## 2023-04-22 DIAGNOSIS — J449 Chronic obstructive pulmonary disease, unspecified: Secondary | ICD-10-CM | POA: Diagnosis present

## 2023-04-22 DIAGNOSIS — I25110 Atherosclerotic heart disease of native coronary artery with unstable angina pectoris: Secondary | ICD-10-CM | POA: Diagnosis present

## 2023-04-22 DIAGNOSIS — I2510 Atherosclerotic heart disease of native coronary artery without angina pectoris: Secondary | ICD-10-CM | POA: Diagnosis not present

## 2023-04-22 DIAGNOSIS — Z86010 Personal history of colonic polyps: Secondary | ICD-10-CM | POA: Diagnosis not present

## 2023-04-22 DIAGNOSIS — Z8711 Personal history of peptic ulcer disease: Secondary | ICD-10-CM

## 2023-04-22 DIAGNOSIS — E8770 Fluid overload, unspecified: Secondary | ICD-10-CM | POA: Diagnosis not present

## 2023-04-22 DIAGNOSIS — I088 Other rheumatic multiple valve diseases: Secondary | ICD-10-CM | POA: Diagnosis not present

## 2023-04-22 DIAGNOSIS — I10 Essential (primary) hypertension: Secondary | ICD-10-CM | POA: Insufficient documentation

## 2023-04-22 DIAGNOSIS — E785 Hyperlipidemia, unspecified: Secondary | ICD-10-CM | POA: Diagnosis present

## 2023-04-22 DIAGNOSIS — E782 Mixed hyperlipidemia: Secondary | ICD-10-CM | POA: Diagnosis not present

## 2023-04-22 DIAGNOSIS — Q2112 Patent foramen ovale: Secondary | ICD-10-CM | POA: Diagnosis not present

## 2023-04-22 DIAGNOSIS — I119 Hypertensive heart disease without heart failure: Secondary | ICD-10-CM | POA: Diagnosis present

## 2023-04-22 DIAGNOSIS — Z87442 Personal history of urinary calculi: Secondary | ICD-10-CM

## 2023-04-22 DIAGNOSIS — K219 Gastro-esophageal reflux disease without esophagitis: Secondary | ICD-10-CM | POA: Diagnosis present

## 2023-04-22 DIAGNOSIS — K2270 Barrett's esophagus without dysplasia: Secondary | ICD-10-CM | POA: Diagnosis present

## 2023-04-22 DIAGNOSIS — J9811 Atelectasis: Secondary | ICD-10-CM | POA: Diagnosis not present

## 2023-04-22 DIAGNOSIS — I200 Unstable angina: Secondary | ICD-10-CM | POA: Diagnosis present

## 2023-04-22 DIAGNOSIS — Z85118 Personal history of other malignant neoplasm of bronchus and lung: Secondary | ICD-10-CM

## 2023-04-22 DIAGNOSIS — Z87891 Personal history of nicotine dependence: Secondary | ICD-10-CM

## 2023-04-22 DIAGNOSIS — Z01810 Encounter for preprocedural cardiovascular examination: Secondary | ICD-10-CM | POA: Diagnosis not present

## 2023-04-22 DIAGNOSIS — J939 Pneumothorax, unspecified: Secondary | ICD-10-CM | POA: Diagnosis not present

## 2023-04-22 DIAGNOSIS — Z7982 Long term (current) use of aspirin: Secondary | ICD-10-CM

## 2023-04-22 DIAGNOSIS — J90 Pleural effusion, not elsewhere classified: Secondary | ICD-10-CM | POA: Diagnosis not present

## 2023-04-22 DIAGNOSIS — D62 Acute posthemorrhagic anemia: Secondary | ICD-10-CM | POA: Diagnosis not present

## 2023-04-22 DIAGNOSIS — I472 Ventricular tachycardia, unspecified: Secondary | ICD-10-CM | POA: Diagnosis not present

## 2023-04-22 DIAGNOSIS — Z951 Presence of aortocoronary bypass graft: Secondary | ICD-10-CM | POA: Diagnosis not present

## 2023-04-22 DIAGNOSIS — I440 Atrioventricular block, first degree: Secondary | ICD-10-CM | POA: Diagnosis present

## 2023-04-22 DIAGNOSIS — Z01818 Encounter for other preprocedural examination: Secondary | ICD-10-CM

## 2023-04-22 HISTORY — DX: Dyspnea, unspecified: R06.00

## 2023-04-22 HISTORY — DX: Hyperlipidemia, unspecified: E78.5

## 2023-04-22 HISTORY — DX: Testicular hypofunction: E29.1

## 2023-04-22 HISTORY — PX: CORONARY PRESSURE/FFR STUDY: CATH118243

## 2023-04-22 HISTORY — DX: Personal history of urinary calculi: Z87.442

## 2023-04-22 HISTORY — DX: Peptic ulcer, site unspecified, unspecified as acute or chronic, without hemorrhage or perforation: K27.9

## 2023-04-22 HISTORY — PX: LEFT HEART CATH AND CORONARY ANGIOGRAPHY: CATH118249

## 2023-04-22 LAB — CBC
HCT: 42.7 % (ref 39.0–52.0)
HCT: 40 % (ref 39.0–52.0)
Hemoglobin: 13.8 g/dL (ref 13.0–17.0)
Hemoglobin: 13.3 g/dL (ref 13.0–17.0)
MCH: 27.5 pg (ref 26.0–34.0)
MCH: 27.6 pg (ref 26.0–34.0)
MCHC: 32.3 g/dL (ref 30.0–36.0)
MCHC: 33.3 g/dL (ref 30.0–36.0)
MCV: 85.2 fL (ref 80.0–100.0)
MCV: 83 fL (ref 80.0–100.0)
Platelets: 180 10*3/uL (ref 150–400)
Platelets: 164 10*3/uL (ref 150–400)
RBC: 5.01 MIL/uL (ref 4.22–5.81)
RBC: 4.82 MIL/uL (ref 4.22–5.81)
RDW: 14.6 % (ref 11.5–15.5)
RDW: 14.5 % (ref 11.5–15.5)
WBC: 6.4 10*3/uL (ref 4.0–10.5)
WBC: 6.1 10*3/uL (ref 4.0–10.5)
nRBC: 0 % (ref 0.0–0.2)
nRBC: 0 % (ref 0.0–0.2)

## 2023-04-22 LAB — CREATININE, SERUM
Creatinine, Ser: 0.78 mg/dL (ref 0.61–1.24)
GFR, Estimated: >60 mL/min (ref >60)

## 2023-04-22 LAB — BASIC METABOLIC PANEL
Anion gap: 8 (ref 5–15)
BUN: 21 mg/dL (ref 8–23)
CO2: 24 mmol/L (ref 22–32)
Calcium: 8.9 mg/dL (ref 8.9–10.3)
Chloride: 105 mmol/L (ref 98–111)
Creatinine, Ser: 0.81 mg/dL (ref 0.61–1.24)
GFR, Estimated: >60 mL/min (ref >60)
Glucose, Bld: 106 mg/dL — ABNORMAL HIGH (ref 70–99)
Potassium: 4.2 mmol/L (ref 3.5–5.1)
Sodium: 137 mmol/L (ref 135–145)

## 2023-04-22 LAB — TYPE AND SCREEN
ABO/RH(D): A NEG
ABO/RH(D): A NEG
Antibody Screen: NEGATIVE
Antibody Screen: NEGATIVE

## 2023-04-22 LAB — POCT ACTIVATED CLOTTING TIME: Activated Clotting Time: 266 seconds

## 2023-04-22 LAB — SURGICAL PCR SCREEN
MRSA, PCR: NEGATIVE
Staphylococcus aureus: NEGATIVE

## 2023-04-22 SURGERY — LEFT HEART CATH AND CORONARY ANGIOGRAPHY
Anesthesia: LOCAL

## 2023-04-22 MED ORDER — HYDRALAZINE HCL 20 MG/ML IJ SOLN: 10.0000 mg | INTRAMUSCULAR | Status: AC | PRN

## 2023-04-22 MED ORDER — VERAPAMIL HCL 2.5 MG/ML IV SOLN
INTRAVENOUS | Status: AC
  Filled 2023-04-22: qty 2

## 2023-04-22 MED ORDER — MOMETASONE FURO-FORMOTEROL FUM 100-5 MCG/ACT IN AERO
2.0000 | INHALATION_SPRAY | Freq: Two times a day (BID) | RESPIRATORY_TRACT | Status: DC
  Administered 2023-04-22 – 2023-04-26 (×9): 2 via RESPIRATORY_TRACT
  Filled 2023-04-22: qty 8.8

## 2023-04-22 MED ORDER — VERAPAMIL HCL 2.5 MG/ML IV SOLN
INTRA_ARTERIAL | Status: DC | PRN
  Administered 2023-04-22: 10 mL via INTRA_ARTERIAL

## 2023-04-22 MED ORDER — HYDROCHLOROTHIAZIDE 12.5 MG PO TABS
12.5000 mg | ORAL_TABLET | Freq: Every day | ORAL | Status: DC
  Administered 2023-04-22 – 2023-04-25 (×4): 12.5 mg via ORAL
  Filled 2023-04-22 (×4): qty 1

## 2023-04-22 MED ORDER — TELMISARTAN-HCTZ 80-12.5 MG PO TABS: 1.0000 | ORAL_TABLET | Freq: Every day | ORAL | Status: DC

## 2023-04-22 MED ORDER — SODIUM CHLORIDE 0.9% FLUSH: 3.0000 mL | INTRAVENOUS | Status: DC | PRN

## 2023-04-22 MED ORDER — SODIUM CHLORIDE 0.9 % WEIGHT BASED INFUSION: 1.0000 mL/kg/h | INTRAVENOUS | Status: DC

## 2023-04-22 MED ORDER — FENTANYL CITRATE (PF) 100 MCG/2ML IJ SOLN
INTRAMUSCULAR | Status: AC
  Filled 2023-04-22: qty 2

## 2023-04-22 MED ORDER — HEPARIN SODIUM (PORCINE) 1000 UNIT/ML IJ SOLN
INTRAMUSCULAR | Status: AC
  Filled 2023-04-22: qty 10

## 2023-04-22 MED ORDER — NITROGLYCERIN 0.4 MG SL SUBL: 0.4000 mg | SUBLINGUAL_TABLET | SUBLINGUAL | Status: DC | PRN

## 2023-04-22 MED ORDER — HEPARIN SODIUM (PORCINE) 1000 UNIT/ML IJ SOLN
INTRAMUSCULAR | Status: DC | PRN
  Administered 2023-04-22: 5000 [IU] via INTRAVENOUS
  Administered 2023-04-22: 4500 [IU] via INTRAVENOUS

## 2023-04-22 MED ORDER — OXYCODONE HCL 5 MG PO TABS: 5.0000 mg | ORAL_TABLET | ORAL | Status: DC | PRN

## 2023-04-22 MED ORDER — IOHEXOL 350 MG/ML SOLN
INTRAVENOUS | Status: DC | PRN
  Administered 2023-04-22: 70 mL

## 2023-04-22 MED ORDER — SODIUM CHLORIDE 0.9 % IV SOLN: 250.0000 mL | INTRAVENOUS | Status: DC | PRN

## 2023-04-22 MED ORDER — ACETAMINOPHEN 325 MG PO TABS: 650.0000 mg | ORAL_TABLET | ORAL | Status: DC | PRN

## 2023-04-22 MED ORDER — DOCUSATE SODIUM 100 MG PO CAPS
100.0000 mg | ORAL_CAPSULE | Freq: Every day | ORAL | Status: DC
  Administered 2023-04-22 – 2023-04-25 (×4): 100 mg via ORAL
  Filled 2023-04-22 (×4): qty 1

## 2023-04-22 MED ORDER — LABETALOL HCL 5 MG/ML IV SOLN: 10.0000 mg | INTRAVENOUS | Status: AC | PRN

## 2023-04-22 MED ORDER — IRBESARTAN 150 MG PO TABS
300.0000 mg | ORAL_TABLET | Freq: Every day | ORAL | Status: DC
  Administered 2023-04-22 – 2023-04-25 (×4): 300 mg via ORAL
  Filled 2023-04-22 (×4): qty 2

## 2023-04-22 MED ORDER — ATORVASTATIN CALCIUM 40 MG PO TABS
40.0000 mg | ORAL_TABLET | Freq: Every day | ORAL | Status: DC
  Administered 2023-04-22 – 2023-04-26 (×5): 40 mg via ORAL
  Filled 2023-04-22 (×5): qty 1

## 2023-04-22 MED ORDER — LIDOCAINE HCL (PF) 1 % IJ SOLN
INTRAMUSCULAR | Status: AC
  Filled 2023-04-22: qty 30

## 2023-04-22 MED ORDER — ONDANSETRON HCL 4 MG/2ML IJ SOLN: 4.0000 mg | Freq: Four times a day (QID) | INTRAMUSCULAR | Status: DC | PRN

## 2023-04-22 MED ORDER — HEPARIN SODIUM (PORCINE) 5000 UNIT/ML IJ SOLN
5000.0000 [IU] | Freq: Three times a day (TID) | INTRAMUSCULAR | Status: DC
  Administered 2023-04-22 – 2023-04-23 (×4): 5000 [IU] via SUBCUTANEOUS
  Filled 2023-04-22 (×4): qty 1

## 2023-04-22 MED ORDER — HEPARIN (PORCINE) IN NACL 1000-0.9 UT/500ML-% IV SOLN
INTRAVENOUS | Status: DC | PRN
  Administered 2023-04-22: 1000 mL

## 2023-04-22 MED ORDER — UMECLIDINIUM BROMIDE 62.5 MCG/ACT IN AEPB
1.0000 | INHALATION_SPRAY | Freq: Every day | RESPIRATORY_TRACT | Status: DC
  Filled 2023-04-22: qty 7

## 2023-04-22 MED ORDER — PANTOPRAZOLE SODIUM 40 MG PO TBEC
40.0000 mg | DELAYED_RELEASE_TABLET | Freq: Every day | ORAL | Status: DC
  Administered 2023-04-23 – 2023-04-26 (×4): 40 mg via ORAL
  Filled 2023-04-22 (×4): qty 1

## 2023-04-22 MED ORDER — VERAPAMIL HCL 2.5 MG/ML IV SOLN
INTRAVENOUS | Status: DC | PRN
  Administered 2023-04-22: 10 mL via INTRA_ARTERIAL

## 2023-04-22 MED ORDER — SODIUM CHLORIDE 0.9 % IV SOLN: INTRAVENOUS | Status: AC

## 2023-04-22 MED ORDER — EZETIMIBE 10 MG PO TABS
10.0000 mg | ORAL_TABLET | Freq: Every day | ORAL | Status: DC
  Administered 2023-04-22 – 2023-04-26 (×5): 10 mg via ORAL
  Filled 2023-04-22 (×5): qty 1

## 2023-04-22 MED ORDER — CARVEDILOL 6.25 MG PO TABS
6.2500 mg | ORAL_TABLET | Freq: Two times a day (BID) | ORAL | Status: DC
  Administered 2023-04-23 (×2): 6.25 mg via ORAL
  Filled 2023-04-22 (×8): qty 1

## 2023-04-22 MED ORDER — CO Q-10 200 MG PO CAPS: 200.0000 mg | ORAL_CAPSULE | Freq: Every day | ORAL | Status: DC

## 2023-04-22 MED ORDER — MIDAZOLAM HCL 2 MG/2ML IJ SOLN
INTRAMUSCULAR | Status: DC | PRN
  Administered 2023-04-22: 2 mg via INTRAVENOUS

## 2023-04-22 MED ORDER — MIDAZOLAM HCL 2 MG/2ML IJ SOLN
INTRAMUSCULAR | Status: AC
  Filled 2023-04-22: qty 2

## 2023-04-22 MED ORDER — ASPIRIN 81 MG PO TBEC
81.0000 mg | DELAYED_RELEASE_TABLET | Freq: Every day | ORAL | Status: DC
  Administered 2023-04-23 – 2023-04-26 (×4): 81 mg via ORAL
  Filled 2023-04-22 (×4): qty 1

## 2023-04-22 MED ORDER — SODIUM CHLORIDE 0.9% FLUSH
3.0000 mL | Freq: Two times a day (BID) | INTRAVENOUS | Status: DC
  Administered 2023-04-23 – 2023-04-24 (×3): 3 mL via INTRAVENOUS

## 2023-04-22 MED ORDER — SODIUM CHLORIDE 0.9 % WEIGHT BASED INFUSION
3.0000 mL/kg/h | INTRAVENOUS | Status: DC
  Administered 2023-04-22: 3 mL/kg/h via INTRAVENOUS

## 2023-04-22 MED ORDER — LIDOCAINE HCL (PF) 1 % IJ SOLN
INTRAMUSCULAR | Status: DC | PRN
  Administered 2023-04-22: 5 mL

## 2023-04-22 MED ORDER — FENTANYL CITRATE (PF) 100 MCG/2ML IJ SOLN
INTRAMUSCULAR | Status: DC | PRN
  Administered 2023-04-22: 50 ug via INTRAVENOUS

## 2023-04-22 MED ORDER — ASPIRIN 81 MG PO CHEW
81.0000 mg | CHEWABLE_TABLET | ORAL | Status: AC
  Administered 2023-04-22: 81 mg via ORAL
  Filled 2023-04-22: qty 1

## 2023-04-22 MED ORDER — BUDESON-GLYCOPYRROL-FORMOTEROL 160-9-4.8 MCG/ACT IN AERO: 2.0000 | INHALATION_SPRAY | Freq: Two times a day (BID) | RESPIRATORY_TRACT | Status: DC

## 2023-04-22 MED ORDER — ALBUTEROL SULFATE (2.5 MG/3ML) 0.083% IN NEBU
2.5000 mg | INHALATION_SOLUTION | Freq: Four times a day (QID) | RESPIRATORY_TRACT | Status: DC | PRN
  Administered 2023-04-29: 2.5 mg via RESPIRATORY_TRACT
  Filled 2023-04-22: qty 3

## 2023-04-22 SURGICAL SUPPLY — 13 items
CATH 5FR JL3.5 JR4 ANG PIG MP (CATHETERS) IMPLANT
CATH VISTA GUIDE 6FR JR4 (CATHETERS) IMPLANT
DEVICE RAD COMP TR BAND LRG (VASCULAR PRODUCTS) IMPLANT
GLIDESHEATH SLEND SS 6F .021 (SHEATH) IMPLANT
GUIDEWIRE INQWIRE 1.5J.035X260 (WIRE) IMPLANT
GUIDEWIRE PRESSURE X 175 (WIRE) IMPLANT
INQWIRE 1.5J .035X260CM (WIRE) ×1
KIT ESSENTIALS PG (KITS) IMPLANT
KIT HEART LEFT (KITS) ×1 IMPLANT
PACK CARDIAC CATHETERIZATION (CUSTOM PROCEDURE TRAY) ×1 IMPLANT
SHEATH 6FR 85 DEST SLENDER (SHEATH) IMPLANT
TRANSDUCER W/STOPCOCK (MISCELLANEOUS) ×1 IMPLANT
TUBING CIL FLEX 10 FLL-RA (TUBING) ×1 IMPLANT

## 2023-04-22 NOTE — Interval H&P Note (Signed)
History and Physical Interval Note:  04/22/2023 10:49 AM  Daniel Colon  has presented today for surgery, with the diagnosis of positive pet scan.  The various methods of treatment have been discussed with the patient and family. After consideration of risks, benefits and other options for treatment, the patient has consented to  Procedure(s): LEFT HEART CATH AND CORONARY ANGIOGRAPHY (N/A) as a surgical intervention.  The patient's history has been reviewed, patient examined, no change in status, stable for surgery.  I have reviewed the patient's chart and labs.  Questions were answered to the patient's satisfaction.    Cath Lab Visit (complete for each Cath Lab visit)  Clinical Evaluation Leading to the Procedure:   ACS: No.  Non-ACS:    Anginal Classification: CCS III  Anti-ischemic medical therapy: Minimal Therapy (1 class of medications)  Non-Invasive Test Results: High risk stress test  Prior CABG: No previous CABG        Verne Carrow

## 2023-04-22 NOTE — Consult Note (Addendum)
[address]       [place] [address]             [phone]        Daniel Colon [facility] Medical Record #[id number] Date of Birth: 1946-02-16  Referring: Kathleene Hazel Primary Care: Garlan Fillers, MD Primary Cardiologist: Rollene Rotunda, MD   Reason for consult: Evaluation for potential surgical management of multivessel coronary artery disease   History of Present Illness:      [name] is a 77 year old gentleman with a past history notable for hypertension, dyslipidemia, COPD, non-small cell lung cancer status post robotic assisted right upper lobectomy by Dr. Nydia Bouton in 2021 known coronary artery disease status post PTCA of the RCA in 1996 in [place], with a strong positive family history for coronary artery disease and who is a former 42-pack-year cigarette smoker having quit in 2012.  He was recently being evaluated for right total hip arthroplasty.  As part of the preoperative workup he had an EKG showing new anterior T wave inversions.  He was referred to cardiology for additional evaluation prior to joint replacement.  He was seen by Dr. Antoine Poche who elicited report had been having shortness of breath with activity.  Cardiac workup followed including an echocardiogram that showed no new wall motion abnormalities and ejection fraction estimated 55 to 60%.  There was concentric left ventricular hypertrophy.  The mitral and aortic valves did not have any significant structural or functional abnormalities.  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  On 04/20/2023, [name] had a cardiac PET scan that was strongly positive for ischemia.  Dr. Antoine Poche recommended further evaluation with left heart catheterization that was carried out earlier today demonstrating severe left main and multivessel coronary artery disease.  Please see the report below for details. CT surgery has been asked to evaluate [name] for consideration  of coronary bypass grafting.  Mr. [name] is currently resting in bed in the Cath Lab.  He denies any pain or shortness of breath.  Grabs his symptoms at home with primarily fatigue and shortness of breath with minimal activity.  He has never had any chest pain. Both he and his wife are retired from [employer].  His wife had bypass surgery by our practice about 5 years ago. [name] continues to be followed by Dr. Shirline Frees for his non-small cell lung cancer.  He has had no evidence of recurrence.  His next CT is planned for September of this year.   Current Activity/ Functional Status: Patient is independent with mobility/ambulation, transfers, ADL's, IADL's.   Zubrod Score: At the time of surgery this patient's most appropriate activity status/level should be described as: []     0    Normal activity, no symptoms [x]     1    Restricted in physical strenuous activity but ambulatory, able to do out light work []     2    Ambulatory and capable of self care, unable to do work activities, up and about                 more than 50%  Of the time                            []     3    Only limited self care, in bed greater than 50% of waking hours []   4    Completely disabled, no self care, confined to bed or chair []     5    Moribund  Past Medical History:  Diagnosis Date   Allergy    Arthritis    Barrett's esophagus    CAD (coronary artery disease)    a. Stent to the RCA 1996 (in [place]);  b. ETT(11/13/13): abnormal with early ST changes to suggest ischemia;   c. LHC (11/17/13):  pLAD 30-40, D1 30, mLAD 70-75, mCFX 70, mRCA stent 40 (ISR), EF 55-65%.  Medical Rx   Cataract    left eye removed, right immature   Colon polyps    Dyslipidemia    Dyspnea    GERD (gastroesophageal reflux disease)    History of kidney stones    Hyperlipidemia    Hypertension    Hypogonadism in male    Myocardial infarction [facility]) 1996   Nephrolithiasis    kidney stone   NSCL Ca dx'd 12/2019    Peptic ulcer disease    PUD (peptic ulcer disease)     Past Surgical History:  Procedure Laterality Date   ANGIOPLASTY     stent - rt cor. art   BILROTH II PROCEDURE     CARDIAC CATHETERIZATION     CIRCUMCISION     COLONOSCOPY     INTERCOSTAL NERVE BLOCK Right 01/24/2020   Procedure: Intercostal Nerve Block;  Surgeon: Delight Ovens, MD;  Location: [facility] OR;  Service: Thoracic;  Laterality: Right;   KNEE ARTHROSCOPY Left    LEFT HEART CATHETERIZATION WITH CORONARY ANGIOGRAM N/A 11/17/2013   Procedure: LEFT HEART CATHETERIZATION WITH CORONARY ANGIOGRAM;  Surgeon: Micheline Chapman, MD;  Location: [facility] CATH LAB;  Service: Cardiovascular;  Laterality: N/A;   LUNG LOBECTOMY  01/2020   had chemo, no radtion   LYMPH NODE DISSECTION N/A 01/24/2020   Procedure: LYMPH NODE DISSECTION;  Surgeon: Delight Ovens, MD;  Location: [facility] OR;  Service: Thoracic;  Laterality: N/A;   NECK LESION BIOPSY     POLYPECTOMY     REFRACTIVE SURGERY     SKIN BIOPSY     Back   Thigh surgery Left    Infant   ulcer surgery     ULNAR NERVE TRANSPOSITION Right 08/05/2017   Procedure: RIGHT ULNAR NERVE DECOMPRESSION;  Surgeon: Betha Loa, MD;  Location: [facility];  Service: Orthopedics;  Laterality: Right;   UPPER GI ENDOSCOPY     VIDEO BRONCHOSCOPY N/A 01/24/2020   Procedure: VIDEO BRONCHOSCOPY;  Surgeon: Delight Ovens, MD;  Location: [facility] OR;  Service: Thoracic;  Laterality: N/A;   WISDOM TOOTH EXTRACTION      Social History   Tobacco Use  Smoking Status Former   Packs/day: 1.00   Years: 42.00   Additional pack years: 0.00   Total pack years: 42.00   Types: Cigarettes   Quit date: 06/21/2011   Years since quitting: 11.8  Smokeless Tobacco Former   Types: Chew    Social History   Substance and Sexual Activity  Alcohol Use Yes   Alcohol/week: 0.0 standard drinks of alcohol   Comment: social     No Known Allergies  Current Facility-Administered Medications  Medication  Dose Route Frequency Provider Last Rate Last Admin   0.9 %  sodium chloride infusion  250 mL Intravenous PRN Rollene Rotunda, MD       0.9 %  sodium chloride infusion   Intravenous Continuous Kathleene Hazel, MD       0.9% sodium  chloride infusion  1 mL/kg/hr Intravenous Continuous Rollene Rotunda, MD 86.6 mL/hr at 04/22/23 1027 1 mL/kg/hr at 04/22/23 1027   acetaminophen (TYLENOL) tablet 650 mg  650 mg Oral Q4H PRN Kathleene Hazel, MD       fentaNYL (SUBLIMAZE) injection    PRN Kathleene Hazel, MD   50 mcg at 04/22/23 1104   Heparin (Porcine) in NaCl 1000-0.9 UT/500ML-% SOLN    PRN Kathleene Hazel, MD   1,000 mL at 04/22/23 1157   heparin sodium (porcine) injection    PRN Kathleene Hazel, MD   5,000 Units at 04/22/23 1140   hydrALAZINE (APRESOLINE) injection 10 mg  10 mg Intravenous Q20 Min PRN Kathleene Hazel, MD       iohexol (OMNIPAQUE) 350 MG/ML injection    PRN Kathleene Hazel, MD   70 mL at 04/22/23 1157   labetalol (NORMODYNE) injection 10 mg  10 mg Intravenous Q10 min PRN Kathleene Hazel, MD       lidocaine (PF) (XYLOCAINE) 1 % injection    PRN Kathleene Hazel, MD   5 mL at 04/22/23 1158   midazolam (VERSED) injection    PRN Kathleene Hazel, MD   2 mg at 04/22/23 1104   ondansetron (ZOFRAN) injection 4 mg  4 mg Intravenous Q6H PRN Kathleene Hazel, MD       oxyCODONE (Oxy IR/ROXICODONE) immediate release tablet 5-10 mg  5-10 mg Oral Q4H PRN Kathleene Hazel, MD       Radial Cocktail (Verapamil 5 mg, NTG, Lidocaine)    PRN Kathleene Hazel, MD   10 mL at 04/22/23 1118   Radial Cocktail/Verapamil only    PRN Kathleene Hazel, MD   10 mL at 04/22/23 1123   sodium chloride flush (NS) 0.9 % injection 3 mL  3 mL Intravenous PRN Rollene Rotunda, MD        Facility-Administered Medications Prior to Admission  Medication Dose Route Frequency Provider Last Rate Last Admin   sodium chloride  flush (NS) 0.9 % injection 3 mL  3 mL Intravenous Q12H Rollene Rotunda, MD       Medications Prior to Admission  Medication Sig Dispense Refill Last Dose   albuterol (VENTOLIN HFA) 108 (90 Base) MCG/ACT inhaler Inhale 2 puffs into the lungs every 6 (six) hours as needed for wheezing or shortness of breath.   04/22/2023 at 0600   aspirin EC 81 MG tablet Take 81 mg by mouth 2 (two) times a week. In the evening on Monday and Thursday   Past Week   atorvastatin (LIPITOR) 40 MG tablet TAKE 1 TABLET DAILY (Patient taking differently: Take 40 mg by mouth at bedtime.) 90 tablet 0 04/21/2023   bisacodyl (DULCOLAX) 5 MG EC tablet Take 5 mg by mouth daily as needed for moderate constipation.   04/21/2023   BREZTRI AEROSPHERE 160-9-4.8 MCG/ACT AERO Inhale 2 puffs into the lungs in the morning and at bedtime.   04/22/2023 at 0600   carvedilol (COREG) 12.5 MG tablet Take 12.5 mg by mouth at bedtime.   04/21/2023   Coenzyme Q10 (CO Q-10) 200 MG CAPS Take 200 mg by mouth at bedtime.   04/21/2023   docusate sodium (COLACE) 100 MG capsule Take 100 mg by mouth at bedtime.   04/21/2023   ezetimibe (ZETIA) 10 MG tablet Take 10 mg by mouth at bedtime.   04/21/2023   fish oil-omega-3 fatty acids 1000 MG capsule Take 1,000 mg by mouth at  bedtime.   04/21/2023   GNP IRON 200 (65 Fe) MG TABS Take 200 mg by mouth at bedtime.   04/21/2023   MICARDIS HCT 80-12.5 MG tablet TAKE 1 TABLET DAILY (Patient taking differently: Take 1 tablet by mouth at bedtime.) 90 tablet 3 04/21/2023   Multiple Vitamin (MULTIVITAMIN WITH MINERALS) TABS tablet Take 1 tablet by mouth daily.   04/21/2023   polyethylene glycol (MIRALAX / GLYCOLAX) 17 g packet Take 17 g by mouth daily as needed for mild constipation.   Past Week   senna (SENOKOT) 8.6 MG tablet Take 1 tablet by mouth at bedtime.   04/21/2023   sildenafil (VIAGRA) 100 MG tablet Take 100 mg by mouth daily as needed for erectile dysfunction.   Past Month   VITAMIN B COMPLEX-C PO Take 1 tablet by mouth at bedtime.    04/21/2023   fluorouracil (EFUDEX) 5 % cream Apply 1 Application topically daily as needed (rash).   More than a month   nitroGLYCERIN (NITROSTAT) 0.4 MG SL tablet Place 1 tablet (0.4 mg total) under the tongue every 5 (five) minutes as needed for chest pain. 25 tablet 5    omeprazole (PRILOSEC) 10 MG capsule Take 10 mg by mouth at bedtime.       Family History  Problem Relation Age of Onset   Sudden death Mother 77   CAD Brother 90       CABG   CAD Sister 75       CABG   CAD Sister 68       CABG   Cancer Brother    Liver disease Other    Colon cancer Neg Hx    Colon polyps Neg Hx    Esophageal cancer Neg Hx    Rectal cancer Neg Hx    Stomach cancer Neg Hx      Review of Systems:       Cardiac Review of Systems: Y or  [    ]= no  Chest Pain [    ]  Resting SOB [   ] Exertional SOB  [ x ]  Orthopnea [  ]   Pedal Edema [   ]    Palpitations [  ] Syncope  [  ]   Presyncope [   ]  General Review of Systems: [Y] = yes [  ]=no Constitional: recent weight change [  ]; anorexia [  ]; fatigue [  ]; nausea [  ]; night sweats [  ]; fever [  ]; or chills [  ]                                                               Dental: Last Dentist visit:   Eye : blurred vision [  ]; diplopia [   ]; vision changes [  ];  Amaurosis fugax[  ]; Resp: cough [  ];  wheezing[  ];  hemoptysis[  ]; shortness of breath[ x ]; paroxysmal nocturnal dyspnea[  ]; dyspnea on exertion[  ]; or orthopnea[  ];  GI:  gallstones[  ], vomiting[  ];  dysphagia[  ]; melena[  ];  hematochezia [  ]; heartburn[  ];   Hx of  Colonoscopy[  ]; GU: kidney stones [  ]; hematuria[  ];  dysuria [  ];  nocturia[  ];  history of     obstruction [  ]; urinary frequency [  ]             Skin: rash, swelling[  ];, hair loss[  ];  peripheral edema[  ];  or itching[  ]; Musculosketetal: myalgias[  ];  joint swelling[  ];  joint erythema[  ];  joint pain[ x ];  back pain[  ];  Heme/Lymph: bruising[  ];  bleeding[  ];  anemia[  ];   Neuro: TIA[  ];  headaches[  ];  stroke[  ];  vertigo[  ];  seizures[  ];   paresthesias[  ];  difficulty walking[  ];  Psych:depression[  ]; anxiety[  ];  Endocrine: diabetes[  ];  thyroid dysfunction[  ];                  Physical Exam: BP (!) 148/65   Pulse (!) 53   Temp 97.9 F (36.6 C) (Temporal)   Resp 18   Ht 5' 8.5" (1.74 m)   Wt 86.6 kg   SpO2 92%   BMI 28.62 kg/m    General appearance: alert, cooperative, and no distress Head: Normocephalic, without obvious abnormality, atraumatic Neck: no adenopathy, no carotid bruit, no JVD, and supple, symmetrical, trachea midline Lymph nodes: Cervical, supraclavicular, and axillary nodes normal. Resp: clear to auscultation bilaterally and the right chest robotic port incisions are all well-healed. Cardio: Regular rate and rhythm without murmur.  Monitor shows normal sinus rhythm. GI: soft, non-tender; bowel sounds normal; no masses,  no organomegaly Extremities: Palpable distal pulses throughout.  No obvious deformity.  There are well-healed scars on the left medial and lateral thigh (patient reports he had osteomyelitis left femur and spent several months in the hospital for treatment prior to age 55.) Neurologic: Grossly normal  Diagnostic Studies & Laboratory data:  CORONARY PRESSURE/FFR STUDY  LEFT HEART CATH AND CORONARY ANGIOGRAPHY   Conclusion      Ost RCA lesion is 70% stenosed.   Prox RCA lesion is 50% stenosed.   Mid RCA lesion is 70% stenosed.   Mid LM to Dist LM lesion is 90% stenosed.   Ost Cx to Prox Cx lesion is 90% stenosed.   Mid Cx lesion is 80% stenosed.   Dist LM to Prox LAD lesion is 99% stenosed.   Mid LAD lesion is 80% stenosed.   Severe distal left main stenosis Severe ostial LAD stenosis. Severe mid LAD stenosis. The distal LAD fills from antegrade flow and from right to left collaterals.  Severe ostial Circumflex stenosis. Severe stenosis mid Circumflex Large dominant RCA with moderate ostial  stenosis, patent mid stented segment with moderate in stent restenosis and moderately severe mid stenosis beyond the stent (RFR 0.90 today with pressure wire analysis suggesting the disease in the RCA is borderline but likely severe enough that a bypass graft would remain patent to the distal RCA).  Normal LV filling pressures.    Recommendations: Will admit to telemetry today. Will ask CT surgery to see him to discuss CABG. He has been stable at home. Will not start IV heparin today. Will continue ASA, statin and beta blocker.   Coronary Findings  Diagnostic Dominance: Right Left Main  Mid LM to Dist LM lesion is 90% stenosed.  Dist LM to Prox LAD lesion is 99% stenosed.    Left Anterior Descending  Vessel is large.  Collaterals  Dist LAD filled by collaterals  from RPDA.    Mid LAD lesion is 80% stenosed.    Left Circumflex  Vessel is large.  Ost Cx to Prox Cx lesion is 90% stenosed.  Mid Cx lesion is 80% stenosed.    Right Coronary Artery  Vessel is large.  Ost RCA lesion is 70% stenosed.  Prox RCA lesion is 50% stenosed. The lesion was previously treated using a bare metal stent over 2 years ago.  Mid RCA lesion is 70% stenosed.    Intervention   No interventions have been documented.   Coronary Diagrams  Diagnostic Dominance: Right         ECHOCARDIOGRAM REPORT       Patient Name:   Daniel Colon Date of Exam: 03/25/2023  Medical Rec #:  [id number]       Height:       68.5 in  Accession #:    [id number]      Weight:       193.0 lb  Date of Birth:  1946/01/08       BSA:          2.024 m  Patient Age:    76 years        BP:           132/60 mmHg  Patient Gender: M               HR:           65 bpm.  Exam Location:  [address]   Procedure: 2D Echo, Cardiac Doppler, Color Doppler, 3D Echo and Strain  Analysis   Indications:   I25.10 Coronary artery disease;    History:        Patient has prior history of Echocardiogram examinations,  most                  recent 11/11/2020. Previous Myocardial Infarction,                  Signs/Symptoms:Shortness of Breath; Risk  Factors:Hypertension                 and Dyslipidemia.    Sonographer:    Cathie Beams RCS  Referring Phys: [address] [place]   IMPRESSIONS     1. Left ventricular ejection fraction, by estimation, is 55 to 60%. The  left ventricle has normal function. The left ventricle has no regional  wall motion abnormalities. There is mild concentric left ventricular  hypertrophy. Left ventricular diastolic  parameters were normal.   2. Right ventricular systolic function is normal. The right ventricular  size is normal.   3. Left atrial size was mildly dilated.   4. Right atrial size was mildly dilated.   5. The mitral valve is normal in structure. Trivial mitral valve  regurgitation. No evidence of mitral stenosis.   6. The aortic valve is tricuspid. There is mild calcification of the  aortic valve. Aortic valve regurgitation is trivial. No aortic stenosis is  present.   7. Aortic dilatation noted. There is borderline dilatation of the  ascending aorta, measuring 38 mm.   8. The inferior vena cava is normal in size with greater than 50%  respiratory variability, suggesting right atrial pressure of 3 mmHg.   FINDINGS   Left Ventricle: Left ventricular ejection fraction, by estimation, is 55  to 60%. The left ventricle has normal function. The left ventricle has no  regional wall motion abnormalities. The left ventricular internal cavity  size was normal  in size. There is   mild concentric left ventricular hypertrophy. Left ventricular diastolic  parameters were normal.   Right Ventricle: The right ventricular size is normal. No increase in  right ventricular wall thickness. Right ventricular systolic function is  normal.   Left Atrium: Left atrial size was mildly dilated.   Right Atrium: Right atrial size was mildly dilated.   Pericardium: There is no evidence of  pericardial effusion.   Mitral Valve: The mitral valve is normal in structure. Trivial mitral  valve regurgitation. No evidence of mitral valve stenosis.   Tricuspid Valve: The tricuspid valve is normal in structure. Tricuspid  valve regurgitation is trivial. No evidence of tricuspid stenosis.   Aortic Valve: The aortic valve is tricuspid. There is mild calcification  of the aortic valve. Aortic valve regurgitation is trivial. No aortic  stenosis is present.   Pulmonic Valve: The pulmonic valve was normal in structure. Pulmonic valve  regurgitation is not visualized. No evidence of pulmonic stenosis.   Aorta: Aortic dilatation noted. There is borderline dilatation of the  ascending aorta, measuring 38 mm.   Venous: The inferior vena cava is normal in size with greater than 50%  respiratory variability, suggesting right atrial pressure of 3 mmHg.   IAS/Shunts: No atrial level shunt detected by color flow Doppler.     LEFT VENTRICLE  PLAX 2D  LVIDd:         4.80 cm   Diastology  LVIDs:         3.40 cm   LV e' medial:    7.51 cm/s  LV PW:         1.30 cm   LV E/e' medial:  12.6  LV IVS:        1.10 cm   LV e' lateral:   13.10 cm/s  LVOT diam:     2.20 cm   LV E/e' lateral: 7.2  LV SV:         103  LV SV Index:   51  LVOT Area:     3.80 cm                             3D Volume EF:                           3D EF:        55 %                           LV EDV:       170 ml                           LV ESV:       76 ml                           LV SV:        94 ml   RIGHT VENTRICLE  RV Basal diam:  3.70 cm  RV S prime:     14.30 cm/s  TAPSE (M-mode): 2.1 cm  RVSP:           22.9 mmHg   LEFT ATRIUM             Index        RIGHT ATRIUM  Index  LA diam:        4.90 cm 2.42 cm/m   RA Pressure: 3.00 mmHg  LA Vol (A2C):   88.5 ml 43.73 ml/m  RA Area:     19.50 cm  LA Vol (A4C):   63.3 ml 31.27 ml/m  RA Volume:   54.70 ml  27.03 ml/m  LA Biplane Vol: 75.0 ml 37.06  ml/m   AORTIC VALVE  LVOT Vmax:   110.00 cm/s  LVOT Vmean:  75.600 cm/s  LVOT VTI:    0.271 m    AORTA  Ao Root diam: 3.50 cm  Ao Asc diam:  3.80 cm   MITRAL VALVE               TRICUSPID VALVE  MV Area (PHT): 4.63 cm    TR Peak grad:   19.9 mmHg  MV Decel Time: 164 msec    TR Vmax:        223.00 cm/s  MV E velocity: 94.60 cm/s  Estimated RAP:  3.00 mmHg  MV A velocity: 76.60 cm/s  RVSP:           22.9 mmHg  MV E/A ratio:  1.23                             SHUNTS                             Systemic VTI:  0.27 m                             Systemic Diam: 2.20 cm   Arvilla Meres MD  Electronically signed by Arvilla Meres MD  Signature Date/Time: 03/25/2023/2:14:38 PM       I have independently reviewed the above radiologic studies and discussed with the patient   Recent Lab Findings: Lab Results  Component Value Date   WBC 6.4 04/22/2023   HGB 13.8 04/22/2023   HCT 42.7 04/22/2023   PLT 180 04/22/2023   GLUCOSE 106 (H) 04/22/2023   ALT 36 09/04/2022   AST 22 09/04/2022   NA 137 04/22/2023   K 4.2 04/22/2023   CL 105 04/22/2023   CREATININE 0.81 04/22/2023   BUN 21 04/22/2023   CO2 24 04/22/2023   INR 1.0 01/22/2020      Assessment / Plan:      -Severe left main and multivessel coronary artery disease-his primary symptoms have been fatigue, weakness, and shortness of breath with activity.  He has not had any chest pain.  Coronary angiography was reviewed.  We agree that coronary bypass grafting is his best option for revascularization.  Left ventricular function appears to be well-preserved on echocardiogram.  There is no significant valvular disease.  He is currently stable and asymptomatic.  He is not on heparin or nitroglycerin infusions.  -Stage IIb non-small cell lung cancer-status post right upper lobectomy by Dr. Sena Hitch in 2021 and subsequent chemotherapy.  He is followed closely by Dr. Shirline Frees and has had no evidence of recurrence.  -History of  COPD-: 42-pack-year smoker.  Was recently seen by pulmonary service for evaluation of shortness of breath which was most likely related to his coronary disease.  -Degenerative joint disease-Mr. Kennerly was in the process of being worked up for right hip arthroplasty when his coronary disease was discovered.  He is hoping to  get the hip replaced as soon as he is sufficiently recovered from coronary bypass surgery.  -History of hypertension: History with carvedilol 6.25 mg twice daily and Micardis HCT 80-12 0.5 daily prior to admission  -Dyslipidemia-on atorvastatin and Zetia  -Coronary bypass grafting and the expected postoperative recovery was discussed with Mr. Cheree Ditto in detail and his questions were answered.  He would like for [place] to proceed with preoperative evaluation and planning for surgery.  Dr. Cliffton Asters will review clinical data and see Mr. Waggle and further details regarding timing of surgery will follow.  I  spent 30 minutes counseling the patient face to face.   Leary Roca, PA-C  04/22/2023 12:38 PM  Agree with above 76yo male admitted following an elective LHC which showed severe LM/3V CAD.  Echo shows preserved biventricular function and no significant valvular disease.  He denies any hx of chest pain, but does occasionally have dyspnea with exertion.  Of note, he has also undergone a lobectomy for cancer in the past.  He is tentatively scheduled for 04/27/23.  Will defer to primary team for disposition prior to surgery.    Lissette Schenk Keane Scrape

## 2023-04-22 NOTE — Progress Notes (Signed)
PHARMACIST - PHYSICIAN ORDER COMMUNICATION  CONCERNING: P&T Medication Policy on Herbal Medications  DESCRIPTION:  This patient's order for:  Co Q 10  has been noted.  This product(s) is classified as an "herbal" or natural product. Due to a lack of definitive safety studies or FDA approval, nonstandard manufacturing practices, plus the potential risk of unknown drug-drug interactions while on inpatient medications, the Pharmacy and Therapeutics Committee does not permit the use of "herbal" or natural products of this type within [facility].   ACTION TAKEN: The pharmacy department is unable to verify this order at this time and your patient has been informed of this safety policy. Please reevaluate patient's clinical condition at discharge and address if the herbal or natural product(s) should be resumed at that time.  Caryl Asp, PharmD Clinical Pharmacist 04/22/2023 2:31 PM

## 2023-04-23 ENCOUNTER — Other Ambulatory Visit: Payer: Self-pay | Admitting: *Deleted

## 2023-04-23 ENCOUNTER — Encounter (HOSPITAL_COMMUNITY): Payer: Self-pay | Admitting: Cardiovascular Disease

## 2023-04-23 ENCOUNTER — Inpatient Hospital Stay (HOSPITAL_COMMUNITY): Payer: Medicare Other

## 2023-04-23 DIAGNOSIS — I200 Unstable angina: Secondary | ICD-10-CM

## 2023-04-23 DIAGNOSIS — Z01810 Encounter for preprocedural cardiovascular examination: Secondary | ICD-10-CM | POA: Diagnosis not present

## 2023-04-23 LAB — CBC
HCT: 39.8 % (ref 39.0–52.0)
Hemoglobin: 13.4 g/dL (ref 13.0–17.0)
MCH: 27.7 pg (ref 26.0–34.0)
MCHC: 33.7 g/dL (ref 30.0–36.0)
MCV: 82.4 fL (ref 80.0–100.0)
Platelets: 161 10*3/uL (ref 150–400)
RBC: 4.83 MIL/uL (ref 4.22–5.81)
RDW: 14.6 % (ref 11.5–15.5)
WBC: 6.8 10*3/uL (ref 4.0–10.5)
nRBC: 0 % (ref 0.0–0.2)

## 2023-04-23 LAB — BLOOD GAS, ARTERIAL
Acid-Base Excess: 2 mmol/L (ref 0.0–2.0)
Bicarbonate: 25.6 mmol/L (ref 20.0–28.0)
Drawn by: 39898
O2 Saturation: 96.8 %
Patient temperature: 37
pCO2 arterial: 36 mmHg (ref 32–48)
pH, Arterial: 7.46 — ABNORMAL HIGH (ref 7.35–7.45)
pO2, Arterial: 81 mmHg — ABNORMAL LOW (ref 83–108)

## 2023-04-23 LAB — BASIC METABOLIC PANEL
Anion gap: 11 (ref 5–15)
BUN: 18 mg/dL (ref 8–23)
CO2: 24 mmol/L (ref 22–32)
Calcium: 9 mg/dL (ref 8.9–10.3)
Chloride: 104 mmol/L (ref 98–111)
Creatinine, Ser: 0.83 mg/dL (ref 0.61–1.24)
GFR, Estimated: >60 mL/min (ref >60)
Glucose, Bld: 106 mg/dL — ABNORMAL HIGH (ref 70–99)
Potassium: 3.8 mmol/L (ref 3.5–5.1)
Sodium: 139 mmol/L (ref 135–145)

## 2023-04-23 LAB — APTT: aPTT: 28 seconds (ref 24–36)

## 2023-04-23 LAB — HEMOGLOBIN A1C
Hgb A1c MFr Bld: 5.2 % (ref 4.8–5.6)
Mean Plasma Glucose: 102.54 mg/dL

## 2023-04-23 LAB — PROTIME-INR
INR: 1 (ref 0.8–1.2)
Prothrombin Time: 13 seconds (ref 11.4–15.2)

## 2023-04-23 NOTE — Progress Notes (Signed)
Progress Note  Patient Name: Daniel Colon Date of Encounter: 04/23/2023  Primary Cardiologist:   None   Subjective   No chest pain.  No SOB.  He wants to go home.    Inpatient Medications    Scheduled Meds:  aspirin EC  81 mg Oral Daily   atorvastatin  40 mg Oral QHS   carvedilol  6.25 mg Oral BID WC   docusate sodium  100 mg Oral QHS   ezetimibe  10 mg Oral QHS   heparin  5,000 Units Subcutaneous Q8H   irbesartan  300 mg Oral QHS   And   hydrochlorothiazide  12.5 mg Oral QHS   mometasone-formoterol  2 puff Inhalation BID   pantoprazole  40 mg Oral Daily   sodium chloride flush  3 mL Intravenous Q12H   umeclidinium bromide  1 puff Inhalation Daily   Continuous Infusions:  sodium chloride     PRN Meds: sodium chloride, acetaminophen, albuterol, nitroGLYCERIN, ondansetron (ZOFRAN) IV, oxyCODONE, sodium chloride flush   Vital Signs    Vitals:   04/22/23 2028 04/23/23 0355 04/23/23 0843 04/23/23 0855  BP: (!) 120/46 (!) 113/56  113/85  Pulse: (!) 54 (!) 52  60  Resp: 15 19  13   Temp: 98.3 F (36.8 C) 97.8 F (36.6 C)    TempSrc: Oral Oral    SpO2: 96% 96% 97%   Weight:      Height:        Intake/Output Summary (Last 24 hours) at 04/23/2023 0942 Last data filed at 04/23/2023 0859 Gross per 24 hour  Intake 749.01 ml  Output --  Net 749.01 ml   Filed Weights   04/22/23 0925  Weight: 86.6 kg    Telemetry    NSR, NSVT brief runs - Personally Reviewed  ECG    NA - Personally Reviewed  Physical Exam   GEN: No acute distress.   Neck: No  JVD Cardiac: RRR, no murmurs, rubs, or gallops.  Respiratory: Clear  to auscultation bilaterally. GI: Soft, nontender, non-distended  MS: No  edema; No deformity.  Some bruising on the right radial access site.  Neuro:  Nonfocal  Psych: Normal affect   Labs    Chemistry Recent Labs  Lab 04/22/23 0751 04/22/23 1425 04/23/23 0400  NA 137  --  139  K 4.2  --  3.8  CL 105  --  104  CO2 24  --  24   GLUCOSE 106*  --  106*  BUN 21  --  18  CREATININE 0.81 0.78 0.83  CALCIUM 8.9  --  9.0  GFRNONAA >60 >60 >60  ANIONGAP 8  --  11     Hematology Recent Labs  Lab 04/22/23 0751 04/22/23 1425 04/23/23 0400  WBC 6.4 6.1 6.8  RBC 5.01 4.82 4.83  HGB 13.8 13.3 13.4  HCT 42.7 40.0 39.8  MCV 85.2 83.0 82.4  MCH 27.5 27.6 27.7  MCHC 32.3 33.3 33.7  RDW 14.6 14.5 14.6  PLT 180 164 161    Cardiac EnzymesNo results for input(s): "TROPONINI" in the last 168 hours. No results for input(s): "TROPIPOC" in the last 168 hours.   BNPNo results for input(s): "BNP", "PROBNP" in the last 168 hours.   DDimer No results for input(s): "DDIMER" in the last 168 hours.   Radiology    CARDIAC CATHETERIZATION  Result Date: 04/22/2023   Suezanne Jacquet RCA lesion is 70% stenosed.   Prox RCA lesion is 50% stenosed.  Mid RCA lesion is 70% stenosed.   Mid LM to Dist LM lesion is 90% stenosed.   Ost Cx to Prox Cx lesion is 90% stenosed.   Mid Cx lesion is 80% stenosed.   Dist LM to Prox LAD lesion is 99% stenosed.   Mid LAD lesion is 80% stenosed. Severe distal left main stenosis Severe ostial LAD stenosis. Severe mid LAD stenosis. The distal LAD fills from antegrade flow and from right to left collaterals. Severe ostial Circumflex stenosis. Severe stenosis mid Circumflex Large dominant RCA with moderate ostial stenosis, patent mid stented segment with moderate in stent restenosis and moderately severe mid stenosis beyond the stent (RFR 0.90 today with pressure wire analysis suggesting the disease in the RCA is borderline but likely severe enough that a bypass graft would remain patent to the distal RCA). Normal LV filling pressures. Recommendations: Will admit to telemetry today. Will ask CT surgery to see him to discuss CABG. He has been stable at home. Will not start IV heparin today. Will continue ASA, statin and beta blocker.    Cardiac Studies   CARDIAC CATH  Diagnostic Dominance:  Right  Intervention   Patient Profile     77 y.o. male with  who presents for follow up of  CAD, s/p stent to RCA in 1996 in [place].  Routine ETT(11/13/13) was abnormal with early ST changes to suggest ischemia. He was set up had LHC (11/17/13):  pLAD 30-40, D1 30, mLAD 70-75, mCFX 70, mRCA stent 40 (ISR), EF 55-65%.   He was managed medically.  He returns for follow up. He had lung right upper lobe resection.  He was treated with chemotherapy.   He was seen recently preop orthopedic surgery and is found to have disease as above.   Assessment & Plan    CAD:  Severe three vessel disease with LM.  CABG scheduled for Tuesday.  He wanted to leave but I suggested he stay particularly given the NSVT.  He agrees.    Will check magnesium.   Dyslipidemia:  Continue current therapy.   HTN:  Continue current therapy.    For questions or updates, please contact [facility] Please consult [email or web address] for contact info under Cardiology/STEMI.   Signed, Rollene Rotunda, MD  04/23/2023, 9:42 AM

## 2023-04-23 NOTE — TOC Initial Note (Signed)
Transition of Care [facility]) - Initial/Assessment Note    Patient Details  Name: Daniel Colon MRN: [id number] Date of Birth: 03/31/1946  Transition of Care [facility] CM/SW Contact:    Gala Lewandowsky, RN Phone Number: 04/23/2023, 1:22 PM  Clinical Narrative: Patient was discussed in progression rounds this morning. Plan for CABG 04-27-23. PTA patient was from home with support of spouse, daughter is also at the bedside. Patient has DME rolling walker and cane in the home. Case Manager will continue to follow for transition of care needs.                 Expected Discharge Plan: Home w Home Health Services Barriers to Discharge: Continued Medical Work up   Patient Goals and CMS Choice Patient states their goals for this hospitalization and ongoing recovery are:: to return home with spouse.  Expected Discharge Plan and Services   Discharge Planning Services: CM Consult Post Acute Care Choice: Home Health Living arrangements for the past 2 months: Single Family Home                 Prior Living Arrangements/Services Living arrangements for the past 2 months: Single Family Home   Patient language and need for interpreter reviewed:: Yes Do you feel safe going back to the place where you live?: Yes      Need for Family Participation in Patient Care: Yes (Comment) Care giver support system in place?: Yes (comment) Current home services: DME (cane and rolling walker)   Permission Sought/Granted Permission sought to share information with : Family Supports, Case Manager   Emotional Assessment Appearance:: Appears stated age Attitude/Demeanor/Rapport: Engaged Affect (typically observed): Appropriate Orientation: : Oriented to Situation, Oriented to  Time, Oriented to Place, Oriented to Self Alcohol / Substance Use: Not Applicable Psych Involvement: No (comment)  Admission diagnosis:  Unstable angina (HCC) [I20.0] Patient Active Problem List   Diagnosis Date Noted   Unstable  angina (HCC) 04/22/2023   Coronary artery disease involving native coronary artery of native heart without angina pectoris 01/25/2022   SOB (shortness of breath) 01/11/2021   COPD with chronic bronchitis and emphysema (HCC) 07/03/2020   Abdominal pain 03/19/2020   Goals of care, counseling/discussion 02/13/2020   Encounter for antineoplastic chemotherapy 02/13/2020   S/P lobectomy of lung 01/24/2020   Lung cancer (HCC) 01/24/2020   Preop cardiovascular exam 01/11/2020   Educated about COVID-19 virus infection 01/11/2020   Abnormal screening CT of chest 01/05/2020   Rectal bleeding 11/13/2019   Dyslipidemia 10/01/2019   Laceration of left ring finger without foreign body without damage to nail 11/24/2017   Hypertension 11/27/2013   HLD (hyperlipidemia) 11/27/2013   GERD (gastroesophageal reflux disease) 10/30/2013   Barrett's esophagus 10/30/2013   History of adenomatous polyp of colon 10/30/2013   CAD in native artery 10/02/2013   PCP:  Garlan Fillers, MD Pharmacy:   [facility] - [place], [place] - [address] [place] [address] Phone: [phone] Fax: [phone]  [facility] - [place], [place] - [address] [place] [address] Phone: [phone] Fax: [phone]  [id number] - [place], [place] - [address] [place] [address] Phone: [phone] Fax: [phone]   Social Determinants of Health (SDOH) Social History: SDOH Screenings   Depression (PHQ2-9): Low Risk  (08/08/2020)  Tobacco Use: Medium Risk (04/23/2023)   Readmission Risk Interventions  No data to display

## 2023-04-23 NOTE — Progress Notes (Signed)
Pre-CABG study completed.  ° °Please see CV Proc for preliminary results.  ° °Shauntelle Jamerson, RDMS, RVT ° °

## 2023-04-23 NOTE — Progress Notes (Signed)
Discussed with pt and family IS (currently 2500 ml), sternal precautions, mobility post op, and d/c planning. They were very receptive. Wife will be with him at d/c. Gave [facility] booklet and video to view. He would like to continue ambulating prior to surgery and we discussed signs of angina (SOB). He agreed to alert his RN.  Ethelda Chick BS, ACSM-CEP 04/23/2023 11:47 AM 1030-1050

## 2023-04-23 NOTE — Progress Notes (Signed)
CARDIAC REHAB PHASE I   PRE:  Rate/Rhythm: 69 SR    BP: sitting 113/85    SpO2:   MODE:  Ambulation: 470 ft   POST:  Rate/Rhythm: 69 SR    BP: sitting 133/50     SpO2: 98 RA  Pt up in room, declines angina. Pt eager to go home before surgery therefore ambulated. No overt sx however pt does say that he "can feel he did something" after walking. VSS. Sts he has significant SOB at home with climbing hills or carrying objects that is resolved with rest.   Dr. Antoine Poche in. Left [facility] booklet and IS but will return to discuss education as pt is currently frustrated with the need to stay in hospital. [phone]   Ethelda Chick BS, ACSM-CEP 04/23/2023 9:40 AM

## 2023-04-23 NOTE — Progress Notes (Signed)
ABG obtained and sent to lab. °

## 2023-04-24 DIAGNOSIS — E782 Mixed hyperlipidemia: Secondary | ICD-10-CM

## 2023-04-24 DIAGNOSIS — I10 Essential (primary) hypertension: Secondary | ICD-10-CM

## 2023-04-24 DIAGNOSIS — I25110 Atherosclerotic heart disease of native coronary artery with unstable angina pectoris: Secondary | ICD-10-CM | POA: Diagnosis not present

## 2023-04-24 LAB — URINALYSIS, ROUTINE W REFLEX MICROSCOPIC
Bilirubin Urine: NEGATIVE
Glucose, UA: NEGATIVE mg/dL
Hgb urine dipstick: NEGATIVE
Ketones, ur: NEGATIVE mg/dL
Leukocytes,Ua: NEGATIVE
Nitrite: NEGATIVE
Protein, ur: NEGATIVE mg/dL
Specific Gravity, Urine: 1.011 (ref 1.005–1.030)
pH: 6 (ref 5.0–8.0)

## 2023-04-24 LAB — SURGICAL PCR SCREEN
MRSA, PCR: NEGATIVE
Staphylococcus aureus: NEGATIVE

## 2023-04-24 LAB — HEPARIN LEVEL (UNFRACTIONATED): Heparin Unfractionated: 0.38 IU/mL (ref 0.30–0.70)

## 2023-04-24 MED ORDER — HEPARIN BOLUS VIA INFUSION
4000.0000 [IU] | Freq: Once | INTRAVENOUS | Status: AC
  Administered 2023-04-24: 4000 [IU] via INTRAVENOUS
  Filled 2023-04-24: qty 4000

## 2023-04-24 MED ORDER — HEPARIN (PORCINE) 25000 UT/250ML-% IV SOLN
1350.0000 [IU]/h | INTRAVENOUS | Status: DC
  Administered 2023-04-24 – 2023-04-25 (×2): 1100 [IU]/h via INTRAVENOUS
  Administered 2023-04-25 – 2023-04-26 (×2): 1350 [IU]/h via INTRAVENOUS
  Filled 2023-04-24 (×4): qty 250

## 2023-04-24 NOTE — Progress Notes (Signed)
Rounding Note    Patient Name: Daniel Colon Date of Encounter: 04/24/2023  [facility] Cardiologist: None   Subjective   Feels well this morning. No chest pain or Sob.   Inpatient Medications    Scheduled Meds:  aspirin EC  81 mg Oral Daily   atorvastatin  40 mg Oral QHS   carvedilol  6.25 mg Oral BID WC   docusate sodium  100 mg Oral QHS   ezetimibe  10 mg Oral QHS   heparin  5,000 Units Subcutaneous Q8H   irbesartan  300 mg Oral QHS   And   hydrochlorothiazide  12.5 mg Oral QHS   mometasone-formoterol  2 puff Inhalation BID   pantoprazole  40 mg Oral Daily   sodium chloride flush  3 mL Intravenous Q12H   umeclidinium bromide  1 puff Inhalation Daily   Continuous Infusions:  sodium chloride     PRN Meds: sodium chloride, acetaminophen, albuterol, nitroGLYCERIN, ondansetron (ZOFRAN) IV, oxyCODONE, sodium chloride flush   Vital Signs    Vitals:   04/23/23 1817 04/23/23 2043 04/23/23 2133 04/24/23 0436  BP: (!) 116/59  (!) 122/59 (!) 114/44  Pulse: (!) 58  (!) 53 (!) 47  Resp:   18 18  Temp:   98.1 F (36.7 C) 97.8 F (36.6 C)  TempSrc:   Oral Oral  SpO2:  94% 97% 95%  Weight:      Height:        Intake/Output Summary (Last 24 hours) at 04/24/2023 0808 Last data filed at 04/23/2023 0859 Gross per 24 hour  Intake 3 ml  Output --  Net 3 ml      04/22/2023    9:25 AM 04/22/2023    7:58 AM 04/21/2023   11:19 AM  Last 3 Weights  Weight (lbs) 191 lb 190 lb 191 lb  Weight (kg) 86.637 kg 86.183 kg 86.637 kg      Telemetry    NSR, 1 brief run of NSVT - Personally Reviewed  ECG    SB, subtle STD in anterolateral leads  - Personally Reviewed  Physical Exam   GEN: No acute distress.   Neck: No JVD Cardiac: Bradycardic, regular, no murmurs Respiratory: Clear to auscultation bilaterally. GI: Soft, nontender, non-distended  MS: No edema; No deformity. Neuro:  Nonfocal  Psych: Normal affect   Labs    High Sensitivity Troponin:  No results  for input(s): "TROPONINIHS" in the last 720 hours.   Chemistry Recent Labs  Lab 04/22/23 0751 04/22/23 1425 04/23/23 0400  NA 137  --  139  K 4.2  --  3.8  CL 105  --  104  CO2 24  --  24  GLUCOSE 106*  --  106*  BUN 21  --  18  CREATININE 0.81 0.78 0.83  CALCIUM 8.9  --  9.0  GFRNONAA >60 >60 >60  ANIONGAP 8  --  11    Lipids No results for input(s): "CHOL", "TRIG", "HDL", "LABVLDL", "LDLCALC", "CHOLHDL" in the last 168 hours.  Hematology Recent Labs  Lab 04/22/23 0751 04/22/23 1425 04/23/23 0400  WBC 6.4 6.1 6.8  RBC 5.01 4.82 4.83  HGB 13.8 13.3 13.4  HCT 42.7 40.0 39.8  MCV 85.2 83.0 82.4  MCH 27.5 27.6 27.7  MCHC 32.3 33.3 33.7  RDW 14.6 14.5 14.6  PLT 180 164 161   Thyroid No results for input(s): "TSH", "FREET4" in the last 168 hours.  BNPNo results for input(s): "BNP", "PROBNP" in the  last 168 hours.  DDimer No results for input(s): "DDIMER" in the last 168 hours.   Radiology    VAS US DOPPLER PRE CABG  Result Date: 04/23/2023 PREOPERATIVE VASCULAR EVALUATION Patient Name:  Daniel Colon  Date of Exam:   04/23/2023 Medical Rec #: [id number]        Accession #:    [id number] Date of Birth: 22-Aug-1946        Patient Gender: M Patient Age:   16 years Exam Location:  [facility] Procedure:      VAS US DOPPLER PRE CABG Referring Phys: HARRELL LIGHTFOOT --------------------------------------------------------------------------------  Indications:      Pre-CABG. Risk Factors:     Hypertension, hyperlipidemia, past history of smoking, prior                   MI, coronary artery disease. Comparison Study: 02-12-2021 Prior ABI was within normal limits. Performing Technologist: Jean Rosenthal RDMS, RVT  Examination Guidelines: A complete evaluation includes B-mode imaging, spectral Doppler, color Doppler, and power Doppler as needed of all accessible portions of each vessel. Bilateral testing is considered an integral part of a complete examination. Limited examinations for  reoccurring indications may be performed as noted.  Right Carotid Findings: +----------+--------+--------+--------+------------+------------------+           PSV cm/sEDV cm/sStenosisDescribe    Comments           +----------+--------+--------+--------+------------+------------------+ CCA Prox  98      16                                             +----------+--------+--------+--------+------------+------------------+ CCA Distal73      14                          intimal thickening +----------+--------+--------+--------+------------+------------------+ ICA Prox  93      18              heterogenous                   +----------+--------+--------+--------+------------+------------------+ ICA Mid   110     27                                             +----------+--------+--------+--------+------------+------------------+ ICA Distal110     24                                             +----------+--------+--------+--------+------------+------------------+ ECA       124     16                                             +----------+--------+--------+--------+------------+------------------+ +----------+--------+-------+---------+------------+           PSV cm/sEDV cmsDescribe Arm Pressure +----------+--------+-------+---------+------------+ Subclavian199            Turbulent             +----------+--------+-------+---------+------------+ +---------+--------+--+--------+--+---------+ VertebralPSV cm/s61EDV cm/s17Antegrade +---------+--------+--+--------+--+---------+ Left Carotid Findings: +----------+--------+--------+--------+--------+------------------+  PSV cm/sEDV cm/sStenosisDescribeComments           +----------+--------+--------+--------+--------+------------------+ CCA Prox  132     24                                         +----------+--------+--------+--------+--------+------------------+ CCA Distal92      16                                          +----------+--------+--------+--------+--------+------------------+ ICA Prox  93      21                      intimal thickening +----------+--------+--------+--------+--------+------------------+ ICA Mid   106     32                                         +----------+--------+--------+--------+--------+------------------+ ICA Distal118     24                                         +----------+--------+--------+--------+--------+------------------+ ECA       109     13                                         +----------+--------+--------+--------+--------+------------------+  +----------+--------+--------+---------+------------+ SubclavianPSV cm/sEDV cm/sDescribe Arm Pressure +----------+--------+--------+---------+------------+           204             Turbulent             +----------+--------+--------+---------+------------+ +---------+--------+--+--------+--+---------+ VertebralPSV cm/s75EDV cm/s17Antegrade +---------+--------+--+--------+--+---------+  ABI Findings: +--------+------------------+-----+---------+--------+ Right   Rt Pressure (mmHg)IndexWaveform Comment  +--------+------------------+-----+---------+--------+ Brachial                       triphasic         +--------+------------------+-----+---------+--------+ PTA                            triphasic         +--------+------------------+-----+---------+--------+ DP                             biphasic          +--------+------------------+-----+---------+--------+ +--------+------------------+-----+---------+-------+ Left    Lt Pressure (mmHg)IndexWaveform Comment +--------+------------------+-----+---------+-------+ Brachial                       triphasic        +--------+------------------+-----+---------+-------+ PTA                            triphasic        +--------+------------------+-----+---------+-------+  DP                             triphasic        +--------+------------------+-----+---------+-------+  Right Doppler Findings: +--------+--------+-----+---------+--------+ Site  PressureIndexDoppler  Comments +--------+--------+-----+---------+--------+ Brachial             triphasic         +--------+--------+-----+---------+--------+ Radial               triphasic         +--------+--------+-----+---------+--------+ Ulnar                triphasic         +--------+--------+-----+---------+--------+  Left Doppler Findings: +--------+--------+-----+---------+--------+ Site    PressureIndexDoppler  Comments +--------+--------+-----+---------+--------+ Brachial             triphasic         +--------+--------+-----+---------+--------+ Radial               triphasic         +--------+--------+-----+---------+--------+ Ulnar                triphasic         +--------+--------+-----+---------+--------+   Summary: Right Carotid: Velocities in the right ICA are consistent with a 1-39% stenosis. Left Carotid: The extracranial vessels were near-normal with only minimal wall               thickening or plaque. Vertebrals:  Bilateral vertebral arteries demonstrate antegrade flow. Subclavians: Bilateral subclavian artery flow was disturbed. Right ABI: Multphasic PTA and DPA. Left ABI: Multphasic PTA and DPA.  Right Upper Extremity: Doppler waveform obliterate with right radial compression. Doppler waveforms remain within normal limits with right ulnar compression. Left Upper Extremity: Doppler waveforms remain within normal limits with left radial compression. Doppler waveforms remain within normal limits with left ulnar compression.    Preliminary    DG Chest Port 1 View  Result Date: 04/23/2023 CLINICAL DATA:  [id number] CAD (coronary artery disease) [id number] EXAM: PORTABLE CHEST 1 VIEW COMPARISON:  CXR 05/09/20 FINDINGS: Unchanged appearance of the right lung apex in the  right upper lobe. No pneumothorax no focal airspace opacity. Unchanged cardiac and mediastinal contours. No radiographically apparent displaced rib fractures. IMPRESSION: Unchanged appearance of the right upper lung. No pneumothorax. No focal airspace opacity. Electronically Signed   By: Lorenza Cambridge M.D.   On: 04/23/2023 11:09   CARDIAC CATHETERIZATION  Result Date: 04/22/2023   Ost RCA lesion is 70% stenosed.   Prox RCA lesion is 50% stenosed.   Mid RCA lesion is 70% stenosed.   Mid LM to Dist LM lesion is 90% stenosed.   Ost Cx to Prox Cx lesion is 90% stenosed.   Mid Cx lesion is 80% stenosed.   Dist LM to Prox LAD lesion is 99% stenosed.   Mid LAD lesion is 80% stenosed. Severe distal left main stenosis Severe ostial LAD stenosis. Severe mid LAD stenosis. The distal LAD fills from antegrade flow and from right to left collaterals. Severe ostial Circumflex stenosis. Severe stenosis mid Circumflex Large dominant RCA with moderate ostial stenosis, patent mid stented segment with moderate in stent restenosis and moderately severe mid stenosis beyond the stent (RFR 0.90 today with pressure wire analysis suggesting the disease in the RCA is borderline but likely severe enough that a bypass graft would remain patent to the distal RCA). Normal LV filling pressures. Recommendations: Will admit to telemetry today. Will ask CT surgery to see him to discuss CABG. He has been stable at home. Will not start IV heparin today. Will continue ASA, statin and beta blocker.    Cardiac Studies   CARDIAC CATH   Diagnostic Dominance: Right  Intervention  Ost RCA lesion is 70% stenosed.   Prox RCA lesion is 50% stenosed.   Mid RCA lesion is 70% stenosed.   Mid LM to Dist LM lesion is 90% stenosed.   Ost Cx to Prox Cx lesion is 90% stenosed.   Mid Cx lesion is 80% stenosed.   Dist LM to Prox LAD lesion is 99% stenosed.   Mid LAD lesion is 80% stenosed.   Severe distal left main stenosis Severe ostial LAD  stenosis. Severe mid LAD stenosis. The distal LAD fills from antegrade flow and from right to left collaterals.  Severe ostial Circumflex stenosis. Severe stenosis mid Circumflex Large dominant RCA with moderate ostial stenosis, patent mid stented segment with moderate in stent restenosis and moderately severe mid stenosis beyond the stent (RFR 0.90 today with pressure wire analysis suggesting the disease in the RCA is borderline but likely severe enough that a bypass graft would remain patent to the distal RCA).  Normal LV filling pressures.    Recommendations: Will admit to telemetry today. Will ask CT surgery to see him to discuss CABG. He has been stable at home. Will not start IV heparin today. Will continue ASA, statin and beta blocker.   TTE 03/2023: IMPRESSIONS     1. Left ventricular ejection fraction, by estimation, is 55 to 60%. The  left ventricle has normal function. The left ventricle has no regional  wall motion abnormalities. There is mild concentric left ventricular  hypertrophy. Left ventricular diastolic  parameters were normal.   2. Right ventricular systolic function is normal. The right ventricular  size is normal.   3. Left atrial size was mildly dilated.   4. Right atrial size was mildly dilated.   5. The mitral valve is normal in structure. Trivial mitral valve  regurgitation. No evidence of mitral stenosis.   6. The aortic valve is tricuspid. There is mild calcification of the  aortic valve. Aortic valve regurgitation is trivial. No aortic stenosis is  present.   7. Aortic dilatation noted. There is borderline dilatation of the  ascending aorta, measuring 38 mm.   8. The inferior vena cava is normal in size with greater than 50%  respiratory variability, suggesting right atrial pressure of 3 mmHg.    Patient Profile     77 y.o. 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  Assessment & Plan    #Left Main and Multivessel CAD: -Patient presented for planned cath after having highly concerning NM PET as outpatient with large area of ischemia, TID and abnormal CFR -Cath revealed 99% distal LM to prox LAD as well as multivessel CAD as described above -TTE with LVEF 55-60% -Now planned for CABG on Tuesday -Will start heparin gtt for now given degree of LM disease -Continue ASA 81mg  daily -Continue lipitor 40mg  daily -Continue zetia 10mg  daily -Continue coreg 6.25mg  BID  #HTN: -Continue coreg 6.25mg  BID -Continue irbesartan 300mg  daily -Continue HCTZ 12.5mg  daily  #HLD: -Continue lipitor 40mg  daily -Continue zetia 10mg  daily -Goal LDL<55  #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

## 2023-04-24 NOTE — Progress Notes (Signed)
ANTICOAGULATION CONSULT NOTE Pharmacy Consult for heparin Indication: chest pain/ACS  No Known Allergies  Patient Measurements: Height: 5' 8.5" (174 cm) Weight: 86.6 kg (191 lb) IBW/kg (Calculated) : 69.55 Heparin Dosing Weight: 86.6 kg  Vital Signs: BP: 130/58 (05/04 0941) Pulse Rate: 55 (05/04 0941)  Labs: Recent Labs    04/22/23 0751 04/22/23 1425 04/23/23 0400 04/23/23 1112 04/24/23 1609  HGB 13.8 13.3 13.4  --   --   HCT 42.7 40.0 39.8  --   --   PLT 180 164 161  --   --   APTT  --   --   --  28  --   LABPROT  --   --   --  13.0  --   INR  --   --   --  1.0  --   HEPARINUNFRC  --   --   --   --  0.38  CREATININE 0.81 0.78 0.83  --   --      Estimated Creatinine Clearance: 84.4 mL/min (by C-G formula based on SCr of 0.83 mg/dL).   Medical History: Past Medical History:  Diagnosis Date   Allergy    Arthritis    Barrett's esophagus    CAD (coronary artery disease)    a. Stent to the RCA 1996 (in [place]);  b. ETT(11/13/13): abnormal with early ST changes to suggest ischemia;   c. LHC (11/17/13):  pLAD 30-40, D1 30, mLAD 70-75, mCFX 70, mRCA stent 40 (ISR), EF 55-65%.  Medical Rx   Cataract    left eye removed, right immature   Colon polyps    Dyslipidemia    Dyspnea    GERD (gastroesophageal reflux disease)    History of kidney stones    Hyperlipidemia    Hypertension    Hypogonadism in male    Myocardial infarction [facility]) 1996   Nephrolithiasis    kidney stone   NSCL Ca dx'd 12/2019   Peptic ulcer disease    PUD (peptic ulcer disease)    Assessment: 77 year old male admitted for planned cath after outpatient PET scan revealed large area of ischemia. LHC on 5/3 revealed 99% occlusion of distal LM to prox LAD as well as multivessel CAD. Pharmacy has been consulted to dose heparin. Patient is not on anticoagulation PTA. Plan for CABG on 04/27/23.  PM update - heparin level therapeutic at 0.38. CBC WNL (last 5/3). No bleed issues reported.  Goal of  Therapy:  Heparin level 0.3-0.7 units/ml Monitor platelets by anticoagulation protocol: Yes   Plan:  Continue heparin infusion at 1100 units/hr Confirmatory heparin level with AM labs Monitor daily CBC, and signs/symptoms of bleeding  Leia Alf, PharmD, BCPS Please check AMION for all [facility] contact numbers Clinical Pharmacist 04/24/2023 5:13 PM

## 2023-04-24 NOTE — Progress Notes (Signed)
Coreg held for HR in low 50s.  Cardiology made aware.  Hinton Dyer, RN

## 2023-04-24 NOTE — Progress Notes (Signed)
ANTICOAGULATION CONSULT NOTE - Initial Consult  Pharmacy Consult for heparin Indication: chest pain/ACS  No Known Allergies  Patient Measurements: Height: 5' 8.5" (174 cm) Weight: 86.6 kg (191 lb) IBW/kg (Calculated) : 69.55 Heparin Dosing Weight: 86.6 kg  Vital Signs: Temp: 97.8 F (36.6 C) (05/04 0436) Temp Source: Oral (05/04 0436) BP: 114/44 (05/04 0436) Pulse Rate: 47 (05/04 0436)  Labs: Recent Labs    04/22/23 0751 04/22/23 1425 04/23/23 0400 04/23/23 1112  HGB 13.8 13.3 13.4  --   HCT 42.7 40.0 39.8  --   PLT 180 164 161  --   APTT  --   --   --  28  LABPROT  --   --   --  13.0  INR  --   --   --  1.0  CREATININE 0.81 0.78 0.83  --     Estimated Creatinine Clearance: 84.4 mL/min (by C-G formula based on SCr of 0.83 mg/dL).   Medical History: Past Medical History:  Diagnosis Date   Allergy    Arthritis    Barrett's esophagus    CAD (coronary artery disease)    a. Stent to the RCA 1996 (in [place]);  b. ETT(11/13/13): abnormal with early ST changes to suggest ischemia;   c. LHC (11/17/13):  pLAD 30-40, D1 30, mLAD 70-75, mCFX 70, mRCA stent 40 (ISR), EF 55-65%.  Medical Rx   Cataract    left eye removed, right immature   Colon polyps    Dyslipidemia    Dyspnea    GERD (gastroesophageal reflux disease)    History of kidney stones    Hyperlipidemia    Hypertension    Hypogonadism in male    Myocardial infarction [facility]) 1996   Nephrolithiasis    kidney stone   NSCL Ca dx'd 12/2019   Peptic ulcer disease    PUD (peptic ulcer disease)    Assessment: 77 year old male admitted for planned cath after outpatient PET scan revealed large area of ischemia. LHC on 5/3 revealed 99% occlusion of distal LM to prox LAD as well as multivessel CAD. Pharmacy has been consulted to dose heparin. Patient is not on anticoagulation PTA. Plan for CABG on 04/27/23.   Goal of Therapy:  Heparin level 0.3-0.7 units/ml Monitor platelets by anticoagulation protocol: Yes   Plan:   Give heparin 4000 unit bolus x1 Start heparin infusion at 1100 units/hour Check 8-hour heparin level Monitor daily heparin levels, CBC, and signs/symptoms of bleeding  Thank you for involving pharmacy in this patient's care.   Rockwell Alexandria, PharmD PGY1 Pharmacy Resident 04/24/2023 9:17 AM

## 2023-04-24 NOTE — Progress Notes (Signed)
Rounding Note    Patient Name: Daniel Colon Date of Encounter: 04/25/2023  [facility] Cardiologist: None   Subjective   No events overnight. Denies chest pain or SOB  Inpatient Medications    Scheduled Meds:  aspirin EC  81 mg Oral Daily   atorvastatin  40 mg Oral QHS   carvedilol  6.25 mg Oral BID WC   docusate sodium  100 mg Oral QHS   ezetimibe  10 mg Oral QHS   irbesartan  300 mg Oral QHS   And   hydrochlorothiazide  12.5 mg Oral QHS   mometasone-formoterol  2 puff Inhalation BID   pantoprazole  40 mg Oral Daily   sodium chloride flush  3 mL Intravenous Q12H   Continuous Infusions:  sodium chloride     heparin 1,100 Units/hr (04/25/23 0346)   PRN Meds: sodium chloride, acetaminophen, albuterol, nitroGLYCERIN, ondansetron (ZOFRAN) IV, oxyCODONE, sodium chloride flush   Vital Signs    Vitals:   04/24/23 0941 04/24/23 1726 04/24/23 1933 04/25/23 0510  BP: (!) 130/58 137/62 125/66 (!) 115/56  Pulse: (!) 55 (!) 53 (!) 55 (!) 51  Resp:   18 16  Temp:   (!) 97.5 F (36.4 C) (!) 97.4 F (36.3 C)  TempSrc:   Oral Oral  SpO2: 99% 99% 100% 100%  Weight:      Height:        Intake/Output Summary (Last 24 hours) at 04/25/2023 0753 Last data filed at 04/25/2023 1610 Gross per 24 hour  Intake 344.56 ml  Output --  Net 344.56 ml      04/22/2023    9:25 AM 04/22/2023    7:58 AM 04/21/2023   11:19 AM  Last 3 Weights  Weight (lbs) 191 lb 190 lb 191 lb  Weight (kg) 86.637 kg 86.183 kg 86.637 kg      Telemetry    SB with HR in 50s - Personally Reviewed  ECG    No new tracing today  - Personally Reviewed  Physical Exam   GEN: No acute distress.   Neck: No JVD Cardiac: Bradycardic, regular, no murmurs Respiratory: Clear to auscultation bilaterally. GI: Soft, nontender, non-distended  MS: No edema; No deformity. Neuro:  Nonfocal  Psych: Normal affect   Labs    High Sensitivity Troponin:  No results for input(s): "TROPONINIHS" in the last 720  hours.   Chemistry Recent Labs  Lab 04/22/23 0751 04/22/23 1425 04/23/23 0400  NA 137  --  139  K 4.2  --  3.8  CL 105  --  104  CO2 24  --  24  GLUCOSE 106*  --  106*  BUN 21  --  18  CREATININE 0.81 0.78 0.83  CALCIUM 8.9  --  9.0  GFRNONAA >60 >60 >60  ANIONGAP 8  --  11    Lipids No results for input(s): "CHOL", "TRIG", "HDL", "LABVLDL", "LDLCALC", "CHOLHDL" in the last 168 hours.  Hematology Recent Labs  Lab 04/22/23 1425 04/23/23 0400 04/25/23 0201  WBC 6.1 6.8 6.8  RBC 4.82 4.83 4.76  HGB 13.3 13.4 13.1  HCT 40.0 39.8 39.8  MCV 83.0 82.4 83.6  MCH 27.6 27.7 27.5  MCHC 33.3 33.7 32.9  RDW 14.5 14.6 14.6  PLT 164 161 160   Thyroid No results for input(s): "TSH", "FREET4" in the last 168 hours.  BNPNo results for input(s): "BNP", "PROBNP" in the last 168 hours.  DDimer No results for input(s): "DDIMER" in the last  168 hours.   Radiology    VAS US DOPPLER PRE CABG  Result Date: 04/24/2023 PREOPERATIVE VASCULAR EVALUATION Patient Name:  Daniel Colon  Date of Exam:   04/23/2023 Medical Rec #: [id number]        Accession #:    [id number] Date of Birth: [birth year]        Patient Gender: M Patient Age:   77 years Exam Location:  [facility] Procedure:      VAS US DOPPLER PRE CABG Referring Phys: HARRELL LIGHTFOOT --------------------------------------------------------------------------------  Indications:      Pre-CABG. Risk Factors:     Hypertension, hyperlipidemia, past history of smoking, prior                   MI, coronary artery disease. Comparison Study: 02-12-2021 Prior ABI was within normal limits. Performing Technologist: Jean Rosenthal RDMS, RVT  Examination Guidelines: A complete evaluation includes B-mode imaging, spectral Doppler, color Doppler, and power Doppler as needed of all accessible portions of each vessel. Bilateral testing is considered an integral part of a complete examination. Limited examinations for reoccurring indications may be performed as  noted.  Right Carotid Findings: +----------+--------+--------+--------+------------+------------------+           PSV cm/sEDV cm/sStenosisDescribe    Comments           +----------+--------+--------+--------+------------+------------------+ CCA Prox  98      16                                             +----------+--------+--------+--------+------------+------------------+ CCA Distal73      14                          intimal thickening +----------+--------+--------+--------+------------+------------------+ ICA Prox  93      18              heterogenous                   +----------+--------+--------+--------+------------+------------------+ ICA Mid   110     27                                             +----------+--------+--------+--------+------------+------------------+ ICA Distal110     24                                             +----------+--------+--------+--------+------------+------------------+ ECA       124     16                                             +----------+--------+--------+--------+------------+------------------+ +----------+--------+-------+---------+------------+           PSV cm/sEDV cmsDescribe Arm Pressure +----------+--------+-------+---------+------------+ Subclavian199            Turbulent             +----------+--------+-------+---------+------------+ +---------+--------+--+--------+--+---------+ VertebralPSV cm/s61EDV cm/s17Antegrade +---------+--------+--+--------+--+---------+ Left Carotid Findings: +----------+--------+--------+--------+--------+------------------+           PSV cm/sEDV cm/sStenosisDescribeComments           +----------+--------+--------+--------+--------+------------------+  CCA Prox  132     24                                         +----------+--------+--------+--------+--------+------------------+ CCA Distal92      16                                          +----------+--------+--------+--------+--------+------------------+ ICA Prox  93      21                      intimal thickening +----------+--------+--------+--------+--------+------------------+ ICA Mid   106     32                                         +----------+--------+--------+--------+--------+------------------+ ICA Distal118     24                                         +----------+--------+--------+--------+--------+------------------+ ECA       109     13                                         +----------+--------+--------+--------+--------+------------------+  +----------+--------+--------+---------+------------+ SubclavianPSV cm/sEDV cm/sDescribe Arm Pressure +----------+--------+--------+---------+------------+           204             Turbulent             +----------+--------+--------+---------+------------+ +---------+--------+--+--------+--+---------+ VertebralPSV cm/s75EDV cm/s17Antegrade +---------+--------+--+--------+--+---------+  ABI Findings: +--------+------------------+-----+---------+--------+ Right   Rt Pressure (mmHg)IndexWaveform Comment  +--------+------------------+-----+---------+--------+ Brachial                       triphasic         +--------+------------------+-----+---------+--------+ PTA                            triphasic         +--------+------------------+-----+---------+--------+ DP                             biphasic          +--------+------------------+-----+---------+--------+ +--------+------------------+-----+---------+-------+ Left    Lt Pressure (mmHg)IndexWaveform Comment +--------+------------------+-----+---------+-------+ Brachial                       triphasic        +--------+------------------+-----+---------+-------+ PTA                            triphasic        +--------+------------------+-----+---------+-------+ DP                              triphasic        +--------+------------------+-----+---------+-------+  Right Doppler Findings: +--------+--------+-----+---------+--------+ Site    PressureIndexDoppler  Comments +--------+--------+-----+---------+--------+ Brachial  triphasic         +--------+--------+-----+---------+--------+ Radial               triphasic         +--------+--------+-----+---------+--------+ Ulnar                triphasic         +--------+--------+-----+---------+--------+  Left Doppler Findings: +--------+--------+-----+---------+--------+ Site    PressureIndexDoppler  Comments +--------+--------+-----+---------+--------+ Brachial             triphasic         +--------+--------+-----+---------+--------+ Radial               triphasic         +--------+--------+-----+---------+--------+ Ulnar                triphasic         +--------+--------+-----+---------+--------+   Summary: Right Carotid: Velocities in the right ICA are consistent with a 1-39% stenosis. Left Carotid: The extracranial vessels were near-normal with only minimal wall               thickening or plaque. Vertebrals:  Bilateral vertebral arteries demonstrate antegrade flow. Subclavians: Bilateral subclavian artery flow was disturbed. Right ABI: Multphasic PTA and DPA. Left ABI: Multphasic PTA and DPA.  Right Upper Extremity: Doppler waveform obliterate with right radial compression. Doppler waveforms remain within normal limits with right ulnar compression. Left Upper Extremity: Doppler waveforms remain within normal limits with left radial compression. Doppler waveforms remain within normal limits with left ulnar compression.  Electronically signed by Coral Else MD on 04/24/2023 at 3:05:08 PM.    Final    DG Chest Port 1 View  Result Date: 04/23/2023 CLINICAL DATA:  [id number] CAD (coronary artery disease) [id number] EXAM: PORTABLE CHEST 1 VIEW COMPARISON:  CXR 05/09/20 FINDINGS: Unchanged appearance of  the right lung apex in the right upper lobe. No pneumothorax no focal airspace opacity. Unchanged cardiac and mediastinal contours. No radiographically apparent displaced rib fractures. IMPRESSION: Unchanged appearance of the right upper lung. No pneumothorax. No focal airspace opacity. Electronically Signed   By: Lorenza Cambridge M.D.   On: 04/23/2023 11:09    Cardiac Studies   CARDIAC CATH   Diagnostic Dominance: Right  Intervention    Ost RCA lesion is 70% stenosed.   Prox RCA lesion is 50% stenosed.   Mid RCA lesion is 70% stenosed.   Mid LM to Dist LM lesion is 90% stenosed.   Ost Cx to Prox Cx lesion is 90% stenosed.   Mid Cx lesion is 80% stenosed.   Dist LM to Prox LAD lesion is 99% stenosed.   Mid LAD lesion is 80% stenosed.   Severe distal left main stenosis Severe ostial LAD stenosis. Severe mid LAD stenosis. The distal LAD fills from antegrade flow and from right to left collaterals.  Severe ostial Circumflex stenosis. Severe stenosis mid Circumflex Large dominant RCA with moderate ostial stenosis, patent mid stented segment with moderate in stent restenosis and moderately severe mid stenosis beyond the stent (RFR 0.90 today with pressure wire analysis suggesting the disease in the RCA is borderline but likely severe enough that a bypass graft would remain patent to the distal RCA).  Normal LV filling pressures.    Recommendations: Will admit to telemetry today. Will ask CT surgery to see him to discuss CABG. He has been stable at home. Will not start IV heparin today. Will continue ASA, statin and beta blocker.   TTE 03/2023: IMPRESSIONS  1. Left ventricular ejection fraction, by estimation, is 55 to 60%. The  left ventricle has normal function. The left ventricle has no regional  wall motion abnormalities. There is mild concentric left ventricular  hypertrophy. Left ventricular diastolic  parameters were normal.   2. Right ventricular systolic function is normal. The  right ventricular  size is normal.   3. Left atrial size was mildly dilated.   4. Right atrial size was mildly dilated.   5. The mitral valve is normal in structure. Trivial mitral valve  regurgitation. No evidence of mitral stenosis.   6. The aortic valve is tricuspid. There is mild calcification of the  aortic valve. Aortic valve regurgitation is trivial. No aortic stenosis is  present.   7. Aortic dilatation noted. There is borderline dilatation of the  ascending aorta, measuring 38 mm.   8. The inferior vena cava is normal in size with greater than 50%  respiratory variability, suggesting right atrial pressure of 3 mmHg.    Patient Profile     77 y.o. 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  Assessment & Plan    #Left Main and Multivessel CAD: -Patient presented for planned cath after having highly concerning NM PET as outpatient with large area of ischemia, TID and abnormal CFR -Cath revealed 99% distal LM to prox LAD as well as multivessel CAD as described above -TTE with LVEF 55-60% -Now planned for CABG on Tuesday -Continue heparin gtt -Continue ASA 81mg  daily -Continue lipitor 40mg  daily -Continue zetia 10mg  daily -Continue coreg 6.25mg  BID with holding parameters  #HTN: -Continue coreg 6.25mg  BID with holding parameters -Continue irbesartan 300mg  daily -Continue HCTZ 12.5mg  daily  #HLD: -Continue lipitor 40mg  daily -Continue zetia 10mg  daily -Goal LDL<55  #Nonsmall cell lung carcinoma: -S/p robotic assisted right upper lobectomy  For questions or updates, please contact [facility] Please consult [email or web address] for contact info under         Signed, Meriam Sprague, MD  04/25/2023, 7:53 AM

## 2023-04-25 ENCOUNTER — Encounter (HOSPITAL_COMMUNITY): Payer: Self-pay | Admitting: Cardiovascular Disease

## 2023-04-25 DIAGNOSIS — I25110 Atherosclerotic heart disease of native coronary artery with unstable angina pectoris: Secondary | ICD-10-CM | POA: Diagnosis not present

## 2023-04-25 LAB — CBC
HCT: 39.8 % (ref 39.0–52.0)
Hemoglobin: 13.1 g/dL (ref 13.0–17.0)
MCH: 27.5 pg (ref 26.0–34.0)
MCHC: 32.9 g/dL (ref 30.0–36.0)
MCV: 83.6 fL (ref 80.0–100.0)
Platelets: 160 10*3/uL (ref 150–400)
RBC: 4.76 MIL/uL (ref 4.22–5.81)
RDW: 14.6 % (ref 11.5–15.5)
WBC: 6.8 10*3/uL (ref 4.0–10.5)
nRBC: 0 % (ref 0.0–0.2)

## 2023-04-25 LAB — HEPARIN LEVEL (UNFRACTIONATED)
Heparin Unfractionated: 0.24 IU/mL — ABNORMAL LOW (ref 0.30–0.70)
Heparin Unfractionated: 0.28 IU/mL — ABNORMAL LOW (ref 0.30–0.70)

## 2023-04-25 NOTE — Progress Notes (Signed)
ANTICOAGULATION CONSULT NOTE Pharmacy Consult for heparin Indication: chest pain/ACS  No Known Allergies  Patient Measurements: Height: 5' 8.5" (174 cm) Weight: 86.6 kg (191 lb) IBW/kg (Calculated) : 69.55 Heparin Dosing Weight: 86.6 kg  Vital Signs: Temp: 97.6 F (36.4 C) (05/05 1456) Temp Source: Oral (05/05 1456) BP: 128/63 (05/05 1456) Pulse Rate: 58 (05/05 1456)  Labs: Recent Labs    04/23/23 0400 04/23/23 1112 04/24/23 1609 04/25/23 0201 04/25/23 1625  HGB 13.4  --   --  13.1  --   HCT 39.8  --   --  39.8  --   PLT 161  --   --  160  --   APTT  --  28  --   --   --   LABPROT  --  13.0  --   --   --   INR  --  1.0  --   --   --   HEPARINUNFRC  --   --  0.38 0.24* 0.28*  CREATININE 0.83  --   --   --   --      Estimated Creatinine Clearance: 84.4 mL/min (by C-G formula based on SCr of 0.83 mg/dL).   Medical History: Past Medical History:  Diagnosis Date   Allergy    Arthritis    Barrett's esophagus    CAD (coronary artery disease)    a. Stent to the RCA 1996 (in [place]);  b. ETT(11/13/13): abnormal with early ST changes to suggest ischemia;   c. LHC (11/17/13):  pLAD 30-40, D1 30, mLAD 70-75, mCFX 70, mRCA stent 40 (ISR), EF 55-65%.  Medical Rx   Cataract    left eye removed, right immature   Colon polyps    Dyslipidemia    Dyspnea    GERD (gastroesophageal reflux disease)    History of kidney stones    Hyperlipidemia    Hypertension    Hypogonadism in male    Myocardial infarction [facility]) 1996   Nephrolithiasis    kidney stone   NSCL Ca dx'd 12/2019   Peptic ulcer disease    PUD (peptic ulcer disease)    Assessment: 77 year old male admitted for planned cath after outpatient PET scan revealed large area of ischemia. LHC on 5/3 revealed 99% occlusion of distal LM to prox LAD as well as multivessel CAD. Pharmacy has been consulted to dose heparin. Patient is not on anticoagulation PTA. Plan for CABG on 04/27/23.  Heparin level slightly  supratherapeutic s/p rate increase to 1250 units/hr  Goal of Therapy:  Heparin level 0.3-0.7 units/ml Monitor platelets by anticoagulation protocol: Yes   Plan:  Increase heparin gtt to 1350 units/hr F/u 8 hour heparin level  Daylene Posey, PharmD, [facility] Clinical Pharmacist ED Pharmacist Phone # [phone] 04/25/2023 5:33 PM

## 2023-04-25 NOTE — Progress Notes (Signed)
ANTICOAGULATION CONSULT NOTE Pharmacy Consult for heparin Indication: chest pain/ACS  No Known Allergies  Patient Measurements: Height: 5' 8.5" (174 cm) Weight: 86.6 kg (191 lb) IBW/kg (Calculated) : 69.55 Heparin Dosing Weight: 86.6 kg  Vital Signs: Temp: 97.4 F (36.3 C) (05/05 0510) Temp Source: Oral (05/05 0510) BP: 115/56 (05/05 0510) Pulse Rate: 51 (05/05 0510)  Labs: Recent Labs    04/22/23 1425 04/23/23 0400 04/23/23 1112 04/24/23 1609 04/25/23 0201  HGB 13.3 13.4  --   --  13.1  HCT 40.0 39.8  --   --  39.8  PLT 164 161  --   --  160  APTT  --   --  28  --   --   LABPROT  --   --  13.0  --   --   INR  --   --  1.0  --   --   HEPARINUNFRC  --   --   --  0.38 0.24*  CREATININE 0.78 0.83  --   --   --      Estimated Creatinine Clearance: 84.4 mL/min (by C-G formula based on SCr of 0.83 mg/dL).   Medical History: Past Medical History:  Diagnosis Date   Allergy    Arthritis    Barrett's esophagus    CAD (coronary artery disease)    a. Stent to the RCA 1996 (in [place]);  b. ETT(11/13/13): abnormal with early ST changes to suggest ischemia;   c. LHC (11/17/13):  pLAD 30-40, D1 30, mLAD 70-75, mCFX 70, mRCA stent 40 (ISR), EF 55-65%.  Medical Rx   Cataract    left eye removed, right immature   Colon polyps    Dyslipidemia    Dyspnea    GERD (gastroesophageal reflux disease)    History of kidney stones    Hyperlipidemia    Hypertension    Hypogonadism in male    Myocardial infarction [facility]) 1996   Nephrolithiasis    kidney stone   NSCL Ca dx'd 12/2019   Peptic ulcer disease    PUD (peptic ulcer disease)    Assessment: 77 year old male admitted for planned cath after outpatient PET scan revealed large area of ischemia. LHC on 5/3 revealed 99% occlusion of distal LM to prox LAD as well as multivessel CAD. Pharmacy has been consulted to dose heparin. Patient is not on anticoagulation PTA. Plan for CABG on 04/27/23.  Heparin level subtherapeutic this  morning at 0.24 on 1100 units/hour. CBC is stable. Pre RN, no issues with infusion reported, no signs/symptoms of bleeding noted.   Goal of Therapy:  Heparin level 0.3-0.7 units/ml Monitor platelets by anticoagulation protocol: Yes   Plan:  Increase heparin infusion to 1250 units/hour Check 8-hour heparin level Monitor daily heparin level, CBC, and signs/symptoms of bleeding  Thank you for involving pharmacy in this patient's care.   Rockwell Alexandria, PharmD PGY1 Pharmacy Resident 04/25/2023 8:59 AM

## 2023-04-26 DIAGNOSIS — E782 Mixed hyperlipidemia: Secondary | ICD-10-CM | POA: Diagnosis not present

## 2023-04-26 DIAGNOSIS — I10 Essential (primary) hypertension: Secondary | ICD-10-CM | POA: Diagnosis not present

## 2023-04-26 DIAGNOSIS — I25110 Atherosclerotic heart disease of native coronary artery with unstable angina pectoris: Secondary | ICD-10-CM | POA: Diagnosis not present

## 2023-04-26 LAB — BPAM RBC: Unit Type and Rh: 600

## 2023-04-26 LAB — CBC
HCT: 39.8 % (ref 39.0–52.0)
Hemoglobin: 13.2 g/dL (ref 13.0–17.0)
MCH: 27.4 pg (ref 26.0–34.0)
MCHC: 33.2 g/dL (ref 30.0–36.0)
MCV: 82.7 fL (ref 80.0–100.0)
Platelets: 168 10*3/uL (ref 150–400)
RBC: 4.81 MIL/uL (ref 4.22–5.81)
RDW: 14.4 % (ref 11.5–15.5)
WBC: 7.5 10*3/uL (ref 4.0–10.5)
nRBC: 0 % (ref 0.0–0.2)

## 2023-04-26 LAB — TYPE AND SCREEN

## 2023-04-26 LAB — PREPARE RBC (CROSSMATCH)

## 2023-04-26 LAB — SARS CORONAVIRUS 2 (TAT 6-24 HRS): SARS Coronavirus 2: NEGATIVE

## 2023-04-26 LAB — HEPARIN LEVEL (UNFRACTIONATED): Heparin Unfractionated: 0.36 IU/mL (ref 0.30–0.70)

## 2023-04-26 MED ORDER — EPINEPHRINE HCL 5 MG/250ML IV SOLN IN NS
0.0000 ug/min | INTRAVENOUS | Status: DC
  Filled 2023-04-26: qty 250

## 2023-04-26 MED ORDER — TRANEXAMIC ACID 1000 MG/10ML IV SOLN
1.5000 mg/kg/h | INTRAVENOUS | Status: AC
  Administered 2023-04-27: 1.5 mg/kg/h via INTRAVENOUS
  Filled 2023-04-26: qty 25

## 2023-04-26 MED ORDER — CEFAZOLIN SODIUM-DEXTROSE 2-4 GM/100ML-% IV SOLN
2.0000 g | INTRAVENOUS | Status: DC
  Filled 2023-04-26: qty 100

## 2023-04-26 MED ORDER — CHLORHEXIDINE GLUCONATE CLOTH 2 % EX PADS
6.0000 | MEDICATED_PAD | Freq: Once | CUTANEOUS | Status: AC
  Administered 2023-04-26: 6 via TOPICAL

## 2023-04-26 MED ORDER — DEXMEDETOMIDINE HCL IN NACL 400 MCG/100ML IV SOLN
0.1000 ug/kg/h | INTRAVENOUS | Status: AC
  Administered 2023-04-27: .7 ug/kg/h via INTRAVENOUS
  Filled 2023-04-26: qty 100

## 2023-04-26 MED ORDER — TEMAZEPAM 15 MG PO CAPS: 15.0000 mg | ORAL_CAPSULE | Freq: Once | ORAL | Status: DC | PRN

## 2023-04-26 MED ORDER — VANCOMYCIN HCL 1500 MG/300ML IV SOLN
1500.0000 mg | INTRAVENOUS | Status: AC
  Administered 2023-04-27: 1500 mg via INTRAVENOUS
  Filled 2023-04-26: qty 300

## 2023-04-26 MED ORDER — CHLORHEXIDINE GLUCONATE CLOTH 2 % EX PADS: 6.0000 | MEDICATED_PAD | Freq: Once | CUTANEOUS | Status: AC

## 2023-04-26 MED ORDER — BISACODYL 5 MG PO TBEC
5.0000 mg | DELAYED_RELEASE_TABLET | Freq: Once | ORAL | Status: DC
  Filled 2023-04-26: qty 1

## 2023-04-26 MED ORDER — PHENYLEPHRINE HCL-NACL 20-0.9 MG/250ML-% IV SOLN
30.0000 ug/min | INTRAVENOUS | Status: DC
  Filled 2023-04-26: qty 250

## 2023-04-26 MED ORDER — CEFAZOLIN SODIUM-DEXTROSE 2-4 GM/100ML-% IV SOLN
2.0000 g | INTRAVENOUS | Status: AC
  Administered 2023-04-27: 2 g via INTRAVENOUS
  Filled 2023-04-26: qty 100

## 2023-04-26 MED ORDER — MANNITOL 20 % IV SOLN
INTRAVENOUS | Status: DC
  Filled 2023-04-26: qty 13

## 2023-04-26 MED ORDER — METOPROLOL TARTRATE 12.5 MG HALF TABLET: 12.5000 mg | ORAL_TABLET | Freq: Once | ORAL | Status: DC

## 2023-04-26 MED ORDER — HEPARIN 30,000 UNITS/1000 ML (OHS) CELLSAVER SOLUTION
Status: DC
  Filled 2023-04-26: qty 1000

## 2023-04-26 MED ORDER — POTASSIUM CHLORIDE 2 MEQ/ML IV SOLN
80.0000 meq | INTRAVENOUS | Status: DC
  Filled 2023-04-26: qty 40

## 2023-04-26 MED ORDER — TRANEXAMIC ACID (OHS) BOLUS VIA INFUSION
15.0000 mg/kg | INTRAVENOUS | Status: AC
  Administered 2023-04-27: 1299 mg via INTRAVENOUS
  Filled 2023-04-26: qty 1299

## 2023-04-26 MED ORDER — NOREPINEPHRINE 4 MG/250ML-% IV SOLN
0.0000 ug/min | INTRAVENOUS | Status: DC
  Filled 2023-04-26: qty 250

## 2023-04-26 MED ORDER — PLASMA-LYTE A IV SOLN
INTRAVENOUS | Status: DC
  Filled 2023-04-26: qty 2.5

## 2023-04-26 MED ORDER — TRANEXAMIC ACID (OHS) PUMP PRIME SOLUTION
2.0000 mg/kg | INTRAVENOUS | Status: DC
  Filled 2023-04-26: qty 1.73

## 2023-04-26 MED ORDER — MILRINONE LACTATE IN DEXTROSE 20-5 MG/100ML-% IV SOLN
0.3000 ug/kg/min | INTRAVENOUS | Status: DC
  Filled 2023-04-26: qty 100

## 2023-04-26 MED ORDER — INSULIN REGULAR(HUMAN) IN NACL 100-0.9 UT/100ML-% IV SOLN
INTRAVENOUS | Status: AC
  Administered 2023-04-27: 1.4 [IU]/h via INTRAVENOUS
  Filled 2023-04-26: qty 100

## 2023-04-26 MED ORDER — NITROGLYCERIN IN D5W 200-5 MCG/ML-% IV SOLN
2.0000 ug/min | INTRAVENOUS | Status: DC
  Filled 2023-04-26: qty 250

## 2023-04-26 MED ORDER — CHLORHEXIDINE GLUCONATE 0.12 % MT SOLN
15.0000 mL | Freq: Once | OROMUCOSAL | Status: AC
  Administered 2023-04-27: 15 mL via OROMUCOSAL
  Filled 2023-04-26: qty 15

## 2023-04-26 MED ORDER — CHLORHEXIDINE GLUCONATE 4 % EX SOLN
Freq: Once | CUTANEOUS | Status: AC
  Filled 2023-04-26: qty 45
  Filled 2023-04-26: qty 15

## 2023-04-26 NOTE — Care Management Important Message (Signed)
Important Message  Patient Details  Name: Daniel Colon MRN: [id number] Date of Birth: [birth year]   Medicare Important Message Given:  Yes     Renie Ora 04/26/2023, 11:11 AM

## 2023-04-26 NOTE — Progress Notes (Signed)
ANTICOAGULATION CONSULT NOTE Pharmacy Consult for heparin Indication: chest pain/ACS  No Known Allergies  Patient Measurements: Height: 5' 8.5" (174 cm) Weight: 86.6 kg (191 lb) IBW/kg (Calculated) : 69.55 Heparin Dosing Weight: 86.6 kg  Vital Signs: Temp: 97.7 F (36.5 C) (05/06 0406) Temp Source: Oral (05/06 0406) BP: 96/55 (05/06 0406) Pulse Rate: 49 (05/06 0406)  Labs: Recent Labs    04/23/23 1112 04/24/23 1609 04/25/23 0201 04/25/23 1625 04/26/23 0235  HGB  --   --  13.1  --  13.2  HCT  --   --  39.8  --  39.8  PLT  --   --  160  --  168  APTT 28  --   --   --   --   LABPROT 13.0  --   --   --   --   INR 1.0  --   --   --   --   HEPARINUNFRC  --    < > 0.24* 0.28* 0.36   < > = values in this interval not displayed.     Estimated Creatinine Clearance: 84.4 mL/min (by C-G formula based on SCr of 0.83 mg/dL).   Medical History: Past Medical History:  Diagnosis Date   Allergy    Arthritis    Barrett's esophagus    CAD (coronary artery disease)    a. Stent to the RCA 1996 (in [place]);  b. ETT(11/13/13): abnormal with early ST changes to suggest ischemia;   c. LHC (11/17/13):  pLAD 30-40, D1 30, mLAD 70-75, mCFX 70, mRCA stent 40 (ISR), EF 55-65%.  Medical Rx   Cataract    left eye removed, right immature   Colon polyps    Dyslipidemia    Dyspnea    GERD (gastroesophageal reflux disease)    History of kidney stones    Hyperlipidemia    Hypertension    Hypogonadism in male    Myocardial infarction [facility]) 1996   Nephrolithiasis    kidney stone   NSCL Ca dx'd 12/2019   Peptic ulcer disease    PUD (peptic ulcer disease)    Assessment: 77 year old male admitted for planned cath after outpatient PET scan revealed large area of ischemia. LHC on 5/3 revealed 99% occlusion of distal LM to prox LAD as well as multivessel CAD. Pharmacy has been consulted to dose heparin. Patient is not on anticoagulation PTA. Plan for CABG on 04/27/23.  Heparin level this morning  came back therapeutic at 0.36, on 1350 units/hr. Hgb 13.2, plt 168. No s/sx of bleeding or infusion issues.   Goal of Therapy:  Heparin level 0.3-0.7 units/ml Monitor platelets by anticoagulation protocol: Yes   Plan:  Continue heparin infusion at 1350 units/hr Monitor daily HL, CBC, and for s/sx of bleeding   Thank you for allowing pharmacy to participate in this patient's care,  Sherron Monday, PharmD, BCCCP Clinical Pharmacist  Phone: [phone] 04/26/2023 7:33 AM  Please check AMION for all [facility] Pharmacy phone numbers After 10:00 PM, call Main Pharmacy [phone]

## 2023-04-26 NOTE — Progress Notes (Addendum)
Rounding Note    Patient Name: Daniel Colon Date of Encounter: 04/26/2023  [facility] Cardiologist: None   Subjective   Feels well this morning. No chest pain.  Inpatient Medications    Scheduled Meds:  aspirin EC  81 mg Oral Daily   atorvastatin  40 mg Oral QHS   carvedilol  6.25 mg Oral BID WC   docusate sodium  100 mg Oral QHS   ezetimibe  10 mg Oral QHS   mometasone-formoterol  2 puff Inhalation BID   pantoprazole  40 mg Oral Daily   sodium chloride flush  3 mL Intravenous Q12H   Continuous Infusions:  sodium chloride     heparin 1,350 Units/hr (04/25/23 2258)   PRN Meds: sodium chloride, acetaminophen, albuterol, nitroGLYCERIN, ondansetron (ZOFRAN) IV, oxyCODONE, sodium chloride flush   Vital Signs    Vitals:   04/25/23 1948 04/25/23 2040 04/26/23 0406 04/26/23 0748  BP:  (!) 117/56 (!) 96/55   Pulse:  (!) 56 (!) 49   Resp:  18 18   Temp:  98.2 F (36.8 C) 97.7 F (36.5 C)   TempSrc:  Oral Oral   SpO2: 97% 97% 97% 98%  Weight:      Height:        Intake/Output Summary (Last 24 hours) at 04/26/2023 0853 Last data filed at 04/25/2023 2000 Gross per 24 hour  Intake 691.17 ml  Output --  Net 691.17 ml       04/22/2023    9:25 AM 04/22/2023    7:58 AM 04/21/2023   11:19 AM  Last 3 Weights  Weight (lbs) 191 lb 190 lb 191 lb  Weight (kg) 86.637 kg 86.183 kg 86.637 kg      Telemetry    NSR/SB with HR 50s - Personally Reviewed  ECG    No new tracing today - Personally Reviewed  Physical Exam   GEN: No acute distress.   Neck: No JVD Cardiac: RRR, no murmurs Respiratory: Clear to auscultation bilaterally. GI: Soft, nontender, non-distended  MS: No edema; No deformity. Neuro:  Nonfocal  Psych: Normal affect   Labs    High Sensitivity Troponin:  No results for input(s): "TROPONINIHS" in the last 720 hours.   Chemistry Recent Labs  Lab 04/22/23 0751 04/22/23 1425 04/23/23 0400  NA 137  --  139  K 4.2  --  3.8  CL 105  --   104  CO2 24  --  24  GLUCOSE 106*  --  106*  BUN 21  --  18  CREATININE 0.81 0.78 0.83  CALCIUM 8.9  --  9.0  GFRNONAA >60 >60 >60  ANIONGAP 8  --  11     Lipids No results for input(s): "CHOL", "TRIG", "HDL", "LABVLDL", "LDLCALC", "CHOLHDL" in the last 168 hours.  Hematology Recent Labs  Lab 04/23/23 0400 04/25/23 0201 04/26/23 0235  WBC 6.8 6.8 7.5  RBC 4.83 4.76 4.81  HGB 13.4 13.1 13.2  HCT 39.8 39.8 39.8  MCV 82.4 83.6 82.7  MCH 27.7 27.5 27.4  MCHC 33.7 32.9 33.2  RDW 14.6 14.6 14.4  PLT 161 160 168    Thyroid No results for input(s): "TSH", "FREET4" in the last 168 hours.  BNPNo results for input(s): "BNP", "PROBNP" in the last 168 hours.  DDimer No results for input(s): "DDIMER" in the last 168 hours.   Radiology    No results found.  Cardiac Studies   CARDIAC CATH   Diagnostic Dominance: Right  Intervention    Ost RCA lesion is 70% stenosed.   Prox RCA lesion is 50% stenosed.   Mid RCA lesion is 70% stenosed.   Mid LM to Dist LM lesion is 90% stenosed.   Ost Cx to Prox Cx lesion is 90% stenosed.   Mid Cx lesion is 80% stenosed.   Dist LM to Prox LAD lesion is 99% stenosed.   Mid LAD lesion is 80% stenosed.   Severe distal left main stenosis Severe ostial LAD stenosis. Severe mid LAD stenosis. The distal LAD fills from antegrade flow and from right to left collaterals.  Severe ostial Circumflex stenosis. Severe stenosis mid Circumflex Large dominant RCA with moderate ostial stenosis, patent mid stented segment with moderate in stent restenosis and moderately severe mid stenosis beyond the stent (RFR 0.90 today with pressure wire analysis suggesting the disease in the RCA is borderline but likely severe enough that a bypass graft would remain patent to the distal RCA).  Normal LV filling pressures.    Recommendations: Will admit to telemetry today. Will ask CT surgery to see him to discuss CABG. He has been stable at home. Will not start IV heparin  today. Will continue ASA, statin and beta blocker.   TTE 03/2023: IMPRESSIONS     1. Left ventricular ejection fraction, by estimation, is 55 to 60%. The  left ventricle has normal function. The left ventricle has no regional  wall motion abnormalities. There is mild concentric left ventricular  hypertrophy. Left ventricular diastolic  parameters were normal.   2. Right ventricular systolic function is normal. The right ventricular  size is normal.   3. Left atrial size was mildly dilated.   4. Right atrial size was mildly dilated.   5. The mitral valve is normal in structure. Trivial mitral valve  regurgitation. No evidence of mitral stenosis.   6. The aortic valve is tricuspid. There is mild calcification of the  aortic valve. Aortic valve regurgitation is trivial. No aortic stenosis is  present.   7. Aortic dilatation noted. There is borderline dilatation of the  ascending aorta, measuring 38 mm.   8. The inferior vena cava is normal in size with greater than 50%  respiratory variability, suggesting right atrial pressure of 3 mmHg.    Patient Profile     77 y.o. 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  Assessment & Plan    #Left Main and Multivessel CAD: -Patient presented for planned cath after having highly concerning NM PET as outpatient with large area of ischemia, TID and abnormal CFR -Cath revealed 99% distal LM to prox LAD as well as multivessel CAD as described above -TTE with LVEF 55-60% -Now planned for CABG on Tuesday -Continue heparin gtt -Continue ASA 81mg  daily -Continue lipitor 40mg  daily -Continue zetia 10mg  daily -Continue coreg 6.25mg  BID with  holding parameters  #HTN: -Continue coreg 6.25mg  BID with holding parameters -Holding irbesartan and HCTZ prior to surgery  #HLD: -Continue lipitor 40mg  daily -Continue zetia 10mg  daily -Goal LDL<55  #Nonsmall cell lung carcinoma: -S/p robotic assisted right upper lobectomy  For questions or updates, please contact [facility] Please consult [email or web address] for contact info under  Signed, Meriam Sprague, MD  04/26/2023, 8:53 AM

## 2023-04-26 NOTE — Progress Notes (Signed)
Pt has been ambulating 940 ft with minimal SOB. Practicing IS as well. Reviewed ed with pt and wife. Eager for surgery.  [id number] Ethelda Chick BS, ACSM-CEP 04/26/2023 11:29 AM

## 2023-04-26 NOTE — Anesthesia Preprocedure Evaluation (Signed)
Anesthesia Evaluation  Patient identified by MRN, date of birth, ID band Patient awake    Reviewed: Allergy & Precautions, NPO status , Patient's Chart, lab work & pertinent test results  History of Anesthesia Complications Negative for: history of anesthetic complications  Airway Mallampati: III  TM Distance: >3 FB Neck ROM: Full    Dental  (+) Dental Advisory Given   Pulmonary COPD, former smoker S/P Thoracoscopy, lung resection NSCLC   Pulmonary exam normal        Cardiovascular hypertension, + CAD  Normal cardiovascular exam  Cath 04/22/2023 Severe distal left main stenosis Severe ostial LAD stenosis. Severe mid LAD stenosis. The distal LAD fills from antegrade flow and from right to left collaterals. Severe ostial Circumflex stenosis. Severe stenosis mid Circumflex Large dominant RCA with moderate ostial stenosis, patent mid stented segment with moderate in stent restenosis and moderately severe mid stenosis beyond the stent (RFR 0.90 today with pressure wire analysis suggesting the disease in the RCA is borderline but likely severe enough that a bypass graft would remain patent to the distal RCA). Normal LV filling pressures.   Recommendations: Will admit to telemetry today. Will ask CT surgery to see him to discuss CABG. He has been stable at home. Will not start IV heparin today. Will continue ASA, statin and beta blocker.    Neuro/Psych negative neurological ROS     GI/Hepatic Neg liver ROS, PUD,GERD  ,,  Endo/Other  negative endocrine ROS    Renal/GU negative Renal ROS     Musculoskeletal negative musculoskeletal ROS (+)    Abdominal   Peds  Hematology negative hematology ROS (+)   Anesthesia Other Findings   Reproductive/Obstetrics                             Anesthesia Physical Anesthesia Plan  ASA: 4  Anesthesia Plan: General   Post-op Pain Management: Tylenol PO  (pre-op)*   Induction: Intravenous  PONV Risk Score and Plan: 3 and Ondansetron, Dexamethasone and Midazolam  Airway Management Planned: Oral ETT  Additional Equipment: Arterial line, 3D TEE, Ultrasound Guidance Line Placement and CVP  Intra-op Plan:   Post-operative Plan: Extubation in OR  Informed Consent: I have reviewed the patients History and Physical, chart, labs and discussed the procedure including the risks, benefits and alternatives for the proposed anesthesia with the patient or authorized representative who has indicated his/her understanding and acceptance.     Dental advisory given  Plan Discussed with: Anesthesiologist and CRNA  Anesthesia Plan Comments:         Anesthesia Quick Evaluation

## 2023-04-27 ENCOUNTER — Inpatient Hospital Stay (HOSPITAL_COMMUNITY): Payer: Medicare Other | Admitting: Certified Registered"

## 2023-04-27 ENCOUNTER — Inpatient Hospital Stay (HOSPITAL_COMMUNITY): Payer: Medicare Other

## 2023-04-27 ENCOUNTER — Other Ambulatory Visit: Payer: Self-pay

## 2023-04-27 ENCOUNTER — Inpatient Hospital Stay (HOSPITAL_COMMUNITY)
Admission: AD | Disposition: A | Discharge status: Home / Self Care | Payer: Self-pay | Source: Home / Self Care | Attending: Cardiovascular Disease

## 2023-04-27 DIAGNOSIS — J449 Chronic obstructive pulmonary disease, unspecified: Secondary | ICD-10-CM

## 2023-04-27 DIAGNOSIS — I2510 Atherosclerotic heart disease of native coronary artery without angina pectoris: Secondary | ICD-10-CM

## 2023-04-27 DIAGNOSIS — Z951 Presence of aortocoronary bypass graft: Secondary | ICD-10-CM

## 2023-04-27 DIAGNOSIS — Z87891 Personal history of nicotine dependence: Secondary | ICD-10-CM

## 2023-04-27 DIAGNOSIS — I10 Essential (primary) hypertension: Secondary | ICD-10-CM

## 2023-04-27 HISTORY — PX: CORONARY ARTERY BYPASS GRAFT: SHX141

## 2023-04-27 HISTORY — DX: Presence of aortocoronary bypass graft: Z95.1

## 2023-04-27 HISTORY — PX: TEE WITHOUT CARDIOVERSION: SHX5443

## 2023-04-27 LAB — POCT I-STAT, CHEM 8
BUN: 26 mg/dL — ABNORMAL HIGH (ref 8–23)
BUN: 24 mg/dL — ABNORMAL HIGH (ref 8–23)
BUN: 21 mg/dL (ref 8–23)
BUN: 25 mg/dL — ABNORMAL HIGH (ref 8–23)
BUN: 26 mg/dL — ABNORMAL HIGH (ref 8–23)
BUN: 22 mg/dL (ref 8–23)
Calcium, Ion: 1.21 mmol/L (ref 1.15–1.40)
Calcium, Ion: 1.21 mmol/L (ref 1.15–1.40)
Calcium, Ion: 0.99 mmol/L — ABNORMAL LOW (ref 1.15–1.40)
Calcium, Ion: 1.09 mmol/L — ABNORMAL LOW (ref 1.15–1.40)
Calcium, Ion: 1.12 mmol/L — ABNORMAL LOW (ref 1.15–1.40)
Calcium, Ion: 1.27 mmol/L (ref 1.15–1.40)
Chloride: 104 mmol/L (ref 98–111)
Chloride: 106 mmol/L (ref 98–111)
Chloride: 101 mmol/L (ref 98–111)
Chloride: 103 mmol/L (ref 98–111)
Chloride: 104 mmol/L (ref 98–111)
Chloride: 106 mmol/L (ref 98–111)
Creatinine, Ser: 0.8 mg/dL (ref 0.61–1.24)
Creatinine, Ser: 0.7 mg/dL (ref 0.61–1.24)
Creatinine, Ser: 0.6 mg/dL — ABNORMAL LOW (ref 0.61–1.24)
Creatinine, Ser: 0.8 mg/dL (ref 0.61–1.24)
Creatinine, Ser: 0.8 mg/dL (ref 0.61–1.24)
Creatinine, Ser: 0.7 mg/dL (ref 0.61–1.24)
Glucose, Bld: 132 mg/dL — ABNORMAL HIGH (ref 70–99)
Glucose, Bld: 93 mg/dL (ref 70–99)
Glucose, Bld: 100 mg/dL — ABNORMAL HIGH (ref 70–99)
Glucose, Bld: 135 mg/dL — ABNORMAL HIGH (ref 70–99)
Glucose, Bld: 134 mg/dL — ABNORMAL HIGH (ref 70–99)
Glucose, Bld: 119 mg/dL — ABNORMAL HIGH (ref 70–99)
HCT: 33 % — ABNORMAL LOW (ref 39.0–52.0)
HCT: 33 % — ABNORMAL LOW (ref 39.0–52.0)
HCT: 27 % — ABNORMAL LOW (ref 39.0–52.0)
HCT: 24 % — ABNORMAL LOW (ref 39.0–52.0)
HCT: 26 % — ABNORMAL LOW (ref 39.0–52.0)
HCT: 21 % — ABNORMAL LOW (ref 39.0–52.0)
Hemoglobin: 11.2 g/dL — ABNORMAL LOW (ref 13.0–17.0)
Hemoglobin: 11.2 g/dL — ABNORMAL LOW (ref 13.0–17.0)
Hemoglobin: 9.2 g/dL — ABNORMAL LOW (ref 13.0–17.0)
Hemoglobin: 8.2 g/dL — ABNORMAL LOW (ref 13.0–17.0)
Hemoglobin: 8.8 g/dL — ABNORMAL LOW (ref 13.0–17.0)
Hemoglobin: 7.1 g/dL — ABNORMAL LOW (ref 13.0–17.0)
Potassium: 3.9 mmol/L (ref 3.5–5.1)
Potassium: 4.3 mmol/L (ref 3.5–5.1)
Potassium: 3.8 mmol/L (ref 3.5–5.1)
Potassium: 4.9 mmol/L (ref 3.5–5.1)
Potassium: 5.3 mmol/L — ABNORMAL HIGH (ref 3.5–5.1)
Potassium: 4.5 mmol/L (ref 3.5–5.1)
Sodium: 136 mmol/L (ref 135–145)
Sodium: 138 mmol/L (ref 135–145)
Sodium: 139 mmol/L (ref 135–145)
Sodium: 135 mmol/L (ref 135–145)
Sodium: 135 mmol/L (ref 135–145)
Sodium: 138 mmol/L (ref 135–145)
TCO2: 23 mmol/L (ref 22–32)
TCO2: 24 mmol/L (ref 22–32)
TCO2: 24 mmol/L (ref 22–32)
TCO2: 25 mmol/L (ref 22–32)
TCO2: 26 mmol/L (ref 22–32)
TCO2: 24 mmol/L (ref 22–32)

## 2023-04-27 LAB — POCT I-STAT 7, (LYTES, BLD GAS, ICA,H+H)
Acid-base deficit: 5 mmol/L — ABNORMAL HIGH (ref 0.0–2.0)
Acid-base deficit: 2 mmol/L (ref 0.0–2.0)
Acid-base deficit: 3 mmol/L — ABNORMAL HIGH (ref 0.0–2.0)
Acid-base deficit: 4 mmol/L — ABNORMAL HIGH (ref 0.0–2.0)
Acid-base deficit: 7 mmol/L — ABNORMAL HIGH (ref 0.0–2.0)
Acid-base deficit: 6 mmol/L — ABNORMAL HIGH (ref 0.0–2.0)
Bicarbonate: 20.4 mmol/L (ref 20.0–28.0)
Bicarbonate: 22.1 mmol/L (ref 20.0–28.0)
Bicarbonate: 22.7 mmol/L (ref 20.0–28.0)
Bicarbonate: 20.7 mmol/L (ref 20.0–28.0)
Bicarbonate: 18.1 mmol/L — ABNORMAL LOW (ref 20.0–28.0)
Bicarbonate: 18.9 mmol/L — ABNORMAL LOW (ref 20.0–28.0)
Calcium, Ion: 1.23 mmol/L (ref 1.15–1.40)
Calcium, Ion: 1 mmol/L — ABNORMAL LOW (ref 1.15–1.40)
Calcium, Ion: 1.26 mmol/L (ref 1.15–1.40)
Calcium, Ion: 1.24 mmol/L (ref 1.15–1.40)
Calcium, Ion: 1.12 mmol/L — ABNORMAL LOW (ref 1.15–1.40)
Calcium, Ion: 1.14 mmol/L — ABNORMAL LOW (ref 1.15–1.40)
HCT: 35 % — ABNORMAL LOW (ref 39.0–52.0)
HCT: 26 % — ABNORMAL LOW (ref 39.0–52.0)
HCT: 24 % — ABNORMAL LOW (ref 39.0–52.0)
HCT: 27 % — ABNORMAL LOW (ref 39.0–52.0)
HCT: 15 % — ABNORMAL LOW (ref 39.0–52.0)
HCT: 16 % — ABNORMAL LOW (ref 39.0–52.0)
Hemoglobin: 11.9 g/dL — ABNORMAL LOW (ref 13.0–17.0)
Hemoglobin: 8.8 g/dL — ABNORMAL LOW (ref 13.0–17.0)
Hemoglobin: 8.2 g/dL — ABNORMAL LOW (ref 13.0–17.0)
Hemoglobin: 9.2 g/dL — ABNORMAL LOW (ref 13.0–17.0)
Hemoglobin: 5.1 g/dL — CL (ref 13.0–17.0)
Hemoglobin: 5.4 g/dL — CL (ref 13.0–17.0)
O2 Saturation: 100 %
O2 Saturation: 100 %
O2 Saturation: 100 %
O2 Saturation: 100 %
O2 Saturation: 100 %
O2 Saturation: 99 %
Patient temperature: 36.4
Patient temperature: 36.9
Patient temperature: 37.3
Potassium: 3.9 mmol/L (ref 3.5–5.1)
Potassium: 3.8 mmol/L (ref 3.5–5.1)
Potassium: 4.4 mmol/L (ref 3.5–5.1)
Potassium: 4.3 mmol/L (ref 3.5–5.1)
Potassium: 4.3 mmol/L (ref 3.5–5.1)
Potassium: 4.3 mmol/L (ref 3.5–5.1)
Sodium: 136 mmol/L (ref 135–145)
Sodium: 138 mmol/L (ref 135–145)
Sodium: 138 mmol/L (ref 135–145)
Sodium: 138 mmol/L (ref 135–145)
Sodium: 139 mmol/L (ref 135–145)
Sodium: 139 mmol/L (ref 135–145)
TCO2: 22 mmol/L (ref 22–32)
TCO2: 23 mmol/L (ref 22–32)
TCO2: 24 mmol/L (ref 22–32)
TCO2: 22 mmol/L (ref 22–32)
TCO2: 19 mmol/L — ABNORMAL LOW (ref 22–32)
TCO2: 20 mmol/L — ABNORMAL LOW (ref 22–32)
pCO2 arterial: 37.2 mmHg (ref 32–48)
pCO2 arterial: 35.1 mmHg (ref 32–48)
pCO2 arterial: 41.3 mmHg (ref 32–48)
pCO2 arterial: 35.4 mmHg (ref 32–48)
pCO2 arterial: 32.4 mmHg (ref 32–48)
pCO2 arterial: 34.4 mmHg (ref 32–48)
pH, Arterial: 7.347 — ABNORMAL LOW (ref 7.35–7.45)
pH, Arterial: 7.408 (ref 7.35–7.45)
pH, Arterial: 7.347 — ABNORMAL LOW (ref 7.35–7.45)
pH, Arterial: 7.371 (ref 7.35–7.45)
pH, Arterial: 7.353 (ref 7.35–7.45)
pH, Arterial: 7.349 — ABNORMAL LOW (ref 7.35–7.45)
pO2, Arterial: 410 mmHg — ABNORMAL HIGH (ref 83–108)
pO2, Arterial: 475 mmHg — ABNORMAL HIGH (ref 83–108)
pO2, Arterial: 505 mmHg — ABNORMAL HIGH (ref 83–108)
pO2, Arterial: 235 mmHg — ABNORMAL HIGH (ref 83–108)
pO2, Arterial: 185 mmHg — ABNORMAL HIGH (ref 83–108)
pO2, Arterial: 149 mmHg — ABNORMAL HIGH (ref 83–108)

## 2023-04-27 LAB — GLUCOSE, CAPILLARY
Glucose-Capillary: 106 mg/dL — ABNORMAL HIGH (ref 70–99)
Glucose-Capillary: 125 mg/dL — ABNORMAL HIGH (ref 70–99)
Glucose-Capillary: 129 mg/dL — ABNORMAL HIGH (ref 70–99)
Glucose-Capillary: 123 mg/dL — ABNORMAL HIGH (ref 70–99)
Glucose-Capillary: 145 mg/dL — ABNORMAL HIGH (ref 70–99)
Glucose-Capillary: 154 mg/dL — ABNORMAL HIGH (ref 70–99)
Glucose-Capillary: 138 mg/dL — ABNORMAL HIGH (ref 70–99)
Glucose-Capillary: 140 mg/dL — ABNORMAL HIGH (ref 70–99)

## 2023-04-27 LAB — CBC
HCT: 39.3 % (ref 39.0–52.0)
HCT: 28.9 % — ABNORMAL LOW (ref 39.0–52.0)
HCT: 19.6 % — ABNORMAL LOW (ref 39.0–52.0)
Hemoglobin: 13.5 g/dL (ref 13.0–17.0)
Hemoglobin: 9.4 g/dL — ABNORMAL LOW (ref 13.0–17.0)
Hemoglobin: 6.7 g/dL — CL (ref 13.0–17.0)
MCH: 27.8 pg (ref 26.0–34.0)
MCH: 27.4 pg (ref 26.0–34.0)
MCH: 28.6 pg (ref 26.0–34.0)
MCHC: 34.4 g/dL (ref 30.0–36.0)
MCHC: 32.5 g/dL (ref 30.0–36.0)
MCHC: 34.2 g/dL (ref 30.0–36.0)
MCV: 80.9 fL (ref 80.0–100.0)
MCV: 84.3 fL (ref 80.0–100.0)
MCV: 83.8 fL (ref 80.0–100.0)
Platelets: 162 10*3/uL (ref 150–400)
Platelets: 136 10*3/uL — ABNORMAL LOW (ref 150–400)
Platelets: 154 10*3/uL (ref 150–400)
RBC: 4.86 MIL/uL (ref 4.22–5.81)
RBC: 3.43 MIL/uL — ABNORMAL LOW (ref 4.22–5.81)
RBC: 2.34 MIL/uL — ABNORMAL LOW (ref 4.22–5.81)
RDW: 14.4 % (ref 11.5–15.5)
RDW: 14.5 % (ref 11.5–15.5)
RDW: 14.6 % (ref 11.5–15.5)
WBC: 7 10*3/uL (ref 4.0–10.5)
WBC: 12.7 10*3/uL — ABNORMAL HIGH (ref 4.0–10.5)
WBC: 14.8 10*3/uL — ABNORMAL HIGH (ref 4.0–10.5)
nRBC: 0 % (ref 0.0–0.2)
nRBC: 0 % (ref 0.0–0.2)
nRBC: 0 % (ref 0.0–0.2)

## 2023-04-27 LAB — BASIC METABOLIC PANEL
Anion gap: 7 (ref 5–15)
Anion gap: 6 (ref 5–15)
BUN: 29 mg/dL — ABNORMAL HIGH (ref 8–23)
BUN: 22 mg/dL (ref 8–23)
CO2: 25 mmol/L (ref 22–32)
CO2: 20 mmol/L — ABNORMAL LOW (ref 22–32)
Calcium: 9 mg/dL (ref 8.9–10.3)
Calcium: 7.6 mg/dL — ABNORMAL LOW (ref 8.9–10.3)
Chloride: 103 mmol/L (ref 98–111)
Chloride: 110 mmol/L (ref 98–111)
Creatinine, Ser: 1.01 mg/dL (ref 0.61–1.24)
Creatinine, Ser: 0.83 mg/dL (ref 0.61–1.24)
GFR, Estimated: >60 mL/min (ref >60)
GFR, Estimated: >60 mL/min (ref >60)
Glucose, Bld: 106 mg/dL — ABNORMAL HIGH (ref 70–99)
Glucose, Bld: 140 mg/dL — ABNORMAL HIGH (ref 70–99)
Potassium: 4.1 mmol/L (ref 3.5–5.1)
Potassium: 4.5 mmol/L (ref 3.5–5.1)
Sodium: 135 mmol/L (ref 135–145)
Sodium: 136 mmol/L (ref 135–145)

## 2023-04-27 LAB — HEMOGLOBIN AND HEMATOCRIT, BLOOD
HCT: 29.7 % — ABNORMAL LOW (ref 39.0–52.0)
Hemoglobin: 10.1 g/dL — ABNORMAL LOW (ref 13.0–17.0)

## 2023-04-27 LAB — LIPOPROTEIN A (LPA): Lipoprotein (a): 170.3 nmol/L — ABNORMAL HIGH (ref <75.0)

## 2023-04-27 LAB — PREPARE RBC (CROSSMATCH)

## 2023-04-27 LAB — PROTIME-INR
INR: 1.5 — ABNORMAL HIGH (ref 0.8–1.2)
Prothrombin Time: 18.6 seconds — ABNORMAL HIGH (ref 11.4–15.2)

## 2023-04-27 LAB — TYPE AND SCREEN: ABO/RH(D): A NEG

## 2023-04-27 LAB — APTT: aPTT: 26 seconds (ref 24–36)

## 2023-04-27 LAB — BPAM RBC
Blood Product Expiration Date: 202406082359
Unit Type and Rh: 600

## 2023-04-27 LAB — ECHO INTRAOPERATIVE TEE
Height: 68.5 in
Weight: 3056 oz

## 2023-04-27 LAB — BPAM CRYOPRECIPITATE
ISSUE DATE / TIME: 202405072054
Unit Type and Rh: 5100

## 2023-04-27 LAB — FIBRINOGEN: Fibrinogen: 203 mg/dL — ABNORMAL LOW (ref 210–475)

## 2023-04-27 LAB — PLATELET COUNT: Platelets: 193 10*3/uL (ref 150–400)

## 2023-04-27 LAB — PREPARE CRYOPRECIPITATE

## 2023-04-27 LAB — HEPARIN LEVEL (UNFRACTIONATED): Heparin Unfractionated: 0.33 IU/mL (ref 0.30–0.70)

## 2023-04-27 LAB — MAGNESIUM: Magnesium: 3 mg/dL — ABNORMAL HIGH (ref 1.7–2.4)

## 2023-04-27 SURGERY — CORONARY ARTERY BYPASS GRAFTING (CABG)
Anesthesia: General | Site: Chest

## 2023-04-27 MED ORDER — VANCOMYCIN HCL IN DEXTROSE 1-5 GM/200ML-% IV SOLN
1000.0000 mg | Freq: Once | INTRAVENOUS | Status: AC
  Administered 2023-04-27: 1000 mg via INTRAVENOUS
  Filled 2023-04-27: qty 200

## 2023-04-27 MED ORDER — PHENYLEPHRINE 80 MCG/ML (10ML) SYRINGE FOR IV PUSH (FOR BLOOD PRESSURE SUPPORT)
PREFILLED_SYRINGE | INTRAVENOUS | Status: AC
  Filled 2023-04-27: qty 10

## 2023-04-27 MED ORDER — FENTANYL CITRATE (PF) 250 MCG/5ML IJ SOLN
INTRAMUSCULAR | Status: AC
  Filled 2023-04-27: qty 5

## 2023-04-27 MED ORDER — NITROGLYCERIN IN D5W 200-5 MCG/ML-% IV SOLN: 0.0000 ug/min | INTRAVENOUS | Status: DC

## 2023-04-27 MED ORDER — SODIUM CHLORIDE 0.45 % IV SOLN: INTRAVENOUS | Status: DC | PRN

## 2023-04-27 MED ORDER — ASPIRIN 325 MG PO TBEC
325.0000 mg | DELAYED_RELEASE_TABLET | Freq: Every day | ORAL | Status: DC
  Administered 2023-04-28 – 2023-05-02 (×5): 325 mg via ORAL
  Filled 2023-04-27 (×5): qty 1

## 2023-04-27 MED ORDER — PROPOFOL 10 MG/ML IV BOLUS
INTRAVENOUS | Status: AC
  Filled 2023-04-27: qty 20

## 2023-04-27 MED ORDER — ROCURONIUM BROMIDE 10 MG/ML (PF) SYRINGE
PREFILLED_SYRINGE | INTRAVENOUS | Status: DC | PRN
  Administered 2023-04-27 (×2): 50 mg via INTRAVENOUS
  Administered 2023-04-27: 100 mg via INTRAVENOUS
  Administered 2023-04-27: 40 mg via INTRAVENOUS
  Administered 2023-04-27 (×2): 30 mg via INTRAVENOUS

## 2023-04-27 MED ORDER — ALBUMIN HUMAN 5 % IV SOLN
12.5000 g | INTRAVENOUS | Status: DC | PRN
  Administered 2023-04-27 (×4): 12.5 g via INTRAVENOUS
  Filled 2023-04-27 (×3): qty 250

## 2023-04-27 MED ORDER — PHENYLEPHRINE 80 MCG/ML (10ML) SYRINGE FOR IV PUSH (FOR BLOOD PRESSURE SUPPORT)
PREFILLED_SYRINGE | INTRAVENOUS | Status: DC | PRN
  Administered 2023-04-27: 20 ug via INTRAVENOUS
  Administered 2023-04-27 (×3): 40 ug via INTRAVENOUS
  Administered 2023-04-27: 20 ug via INTRAVENOUS
  Administered 2023-04-27: 40 ug via INTRAVENOUS

## 2023-04-27 MED ORDER — LACTATED RINGERS IV SOLN: INTRAVENOUS | Status: DC | PRN

## 2023-04-27 MED ORDER — METOPROLOL TARTRATE 5 MG/5ML IV SOLN: 2.5000 mg | INTRAVENOUS | Status: DC | PRN

## 2023-04-27 MED ORDER — ASPIRIN 81 MG PO CHEW
324.0000 mg | CHEWABLE_TABLET | Freq: Every day | ORAL | Status: DC
  Filled 2023-04-27 (×2): qty 4

## 2023-04-27 MED ORDER — POTASSIUM CHLORIDE 10 MEQ/50ML IV SOLN: 10.0000 meq | INTRAVENOUS | Status: AC

## 2023-04-27 MED ORDER — TRAMADOL HCL 50 MG PO TABS
50.0000 mg | ORAL_TABLET | ORAL | Status: DC | PRN
  Administered 2023-04-28 (×2): 100 mg via ORAL
  Filled 2023-04-27 (×2): qty 2

## 2023-04-27 MED ORDER — SODIUM CHLORIDE 0.9% FLUSH
3.0000 mL | Freq: Two times a day (BID) | INTRAVENOUS | Status: DC
  Administered 2023-04-28 – 2023-05-01 (×6): 3 mL via INTRAVENOUS

## 2023-04-27 MED ORDER — CEFAZOLIN SODIUM-DEXTROSE 2-4 GM/100ML-% IV SOLN
2.0000 g | Freq: Three times a day (TID) | INTRAVENOUS | Status: AC
  Administered 2023-04-27 – 2023-04-29 (×6): 2 g via INTRAVENOUS
  Filled 2023-04-27 (×6): qty 100

## 2023-04-27 MED ORDER — SODIUM CHLORIDE 0.9 % IV SOLN: INTRAVENOUS | Status: DC

## 2023-04-27 MED ORDER — MOMETASONE FURO-FORMOTEROL FUM 100-5 MCG/ACT IN AERO
2.0000 | INHALATION_SPRAY | Freq: Two times a day (BID) | RESPIRATORY_TRACT | Status: DC
  Administered 2023-04-28 – 2023-05-02 (×8): 2 via RESPIRATORY_TRACT
  Filled 2023-04-27: qty 8.8

## 2023-04-27 MED ORDER — ALBUMIN HUMAN 5 % IV SOLN: INTRAVENOUS | Status: DC | PRN

## 2023-04-27 MED ORDER — HEPARIN SODIUM (PORCINE) 1000 UNIT/ML IJ SOLN
INTRAMUSCULAR | Status: DC | PRN
  Administered 2023-04-27: 30000 [IU] via INTRAVENOUS

## 2023-04-27 MED ORDER — ONDANSETRON HCL 4 MG/2ML IJ SOLN: 4.0000 mg | Freq: Four times a day (QID) | INTRAMUSCULAR | Status: DC | PRN

## 2023-04-27 MED ORDER — DEXMEDETOMIDINE HCL IN NACL 400 MCG/100ML IV SOLN
0.0000 ug/kg/h | INTRAVENOUS | Status: DC
  Administered 2023-04-27: 0.7 ug/kg/h via INTRAVENOUS
  Filled 2023-04-27: qty 100

## 2023-04-27 MED ORDER — 0.9 % SODIUM CHLORIDE (POUR BTL) OPTIME
TOPICAL | Status: DC | PRN
  Administered 2023-04-27: 5000 mL

## 2023-04-27 MED ORDER — DOCUSATE SODIUM 100 MG PO CAPS
200.0000 mg | ORAL_CAPSULE | Freq: Every day | ORAL | Status: DC
  Administered 2023-04-28 – 2023-05-02 (×5): 200 mg via ORAL
  Filled 2023-04-27 (×5): qty 2

## 2023-04-27 MED ORDER — MIDAZOLAM HCL (PF) 10 MG/2ML IJ SOLN
INTRAMUSCULAR | Status: AC
  Filled 2023-04-27: qty 2

## 2023-04-27 MED ORDER — EZETIMIBE 10 MG PO TABS
10.0000 mg | ORAL_TABLET | Freq: Every day | ORAL | Status: DC
  Administered 2023-04-28 – 2023-05-01 (×4): 10 mg via ORAL
  Filled 2023-04-27 (×4): qty 1

## 2023-04-27 MED ORDER — OXYCODONE HCL 5 MG PO TABS: 5.0000 mg | ORAL_TABLET | ORAL | Status: DC | PRN

## 2023-04-27 MED ORDER — BISACODYL 5 MG PO TBEC
10.0000 mg | DELAYED_RELEASE_TABLET | Freq: Every day | ORAL | Status: DC
  Administered 2023-04-28 – 2023-05-02 (×4): 10 mg via ORAL
  Filled 2023-04-27 (×5): qty 2

## 2023-04-27 MED ORDER — BISACODYL 10 MG RE SUPP
10.0000 mg | Freq: Every day | RECTAL | Status: DC
  Filled 2023-04-27: qty 1

## 2023-04-27 MED ORDER — SODIUM CHLORIDE (PF) 0.9 % IJ SOLN
OROMUCOSAL | Status: DC | PRN
  Administered 2023-04-27 (×2): 4 mL via TOPICAL

## 2023-04-27 MED ORDER — LACTATED RINGERS IV SOLN
500.0000 mL | Freq: Once | INTRAVENOUS | Status: AC | PRN
  Administered 2023-04-27: 500 mL via INTRAVENOUS

## 2023-04-27 MED ORDER — ACETAMINOPHEN 650 MG RE SUPP: 650.0000 mg | Freq: Once | RECTAL | Status: AC

## 2023-04-27 MED ORDER — ALBUMIN HUMAN 5 % IV SOLN
250.0000 mL | INTRAVENOUS | Status: AC | PRN
  Administered 2023-04-27 (×4): 12.5 g via INTRAVENOUS
  Filled 2023-04-27: qty 250

## 2023-04-27 MED ORDER — ACETAMINOPHEN 160 MG/5ML PO SOLN
650.0000 mg | Freq: Once | ORAL | Status: AC
  Administered 2023-04-27: 650 mg

## 2023-04-27 MED ORDER — FAMOTIDINE IN NACL 20-0.9 MG/50ML-% IV SOLN
20.0000 mg | Freq: Two times a day (BID) | INTRAVENOUS | Status: AC
  Administered 2023-04-27 (×2): 20 mg via INTRAVENOUS
  Filled 2023-04-27 (×2): qty 50

## 2023-04-27 MED ORDER — METOPROLOL TARTRATE 25 MG/10 ML ORAL SUSPENSION: 12.5000 mg | Freq: Two times a day (BID) | ORAL | Status: DC

## 2023-04-27 MED ORDER — SODIUM CHLORIDE 0.9 % IV SOLN: 250.0000 mL | INTRAVENOUS | Status: DC

## 2023-04-27 MED ORDER — HEPARIN SODIUM (PORCINE) 1000 UNIT/ML IJ SOLN
INTRAMUSCULAR | Status: AC
  Filled 2023-04-27: qty 1

## 2023-04-27 MED ORDER — METOPROLOL TARTRATE 12.5 MG HALF TABLET: 12.5000 mg | ORAL_TABLET | Freq: Two times a day (BID) | ORAL | Status: DC

## 2023-04-27 MED ORDER — ACETAMINOPHEN 160 MG/5ML PO SOLN: 1000.0000 mg | Freq: Four times a day (QID) | ORAL | Status: DC

## 2023-04-27 MED ORDER — LIDOCAINE 2% (20 MG/ML) 5 ML SYRINGE
INTRAMUSCULAR | Status: AC
  Filled 2023-04-27: qty 5

## 2023-04-27 MED ORDER — SODIUM CHLORIDE 0.9% IV SOLUTION: Freq: Once | INTRAVENOUS | Status: AC

## 2023-04-27 MED ORDER — PROPOFOL 10 MG/ML IV BOLUS
INTRAVENOUS | Status: DC | PRN
  Administered 2023-04-27: 100 mg via INTRAVENOUS

## 2023-04-27 MED ORDER — MORPHINE SULFATE (PF) 2 MG/ML IV SOLN
1.0000 mg | INTRAVENOUS | Status: DC | PRN
  Administered 2023-04-27: 2 mg via INTRAVENOUS
  Filled 2023-04-27: qty 1

## 2023-04-27 MED ORDER — LACTATED RINGERS IV SOLN: INTRAVENOUS | Status: DC

## 2023-04-27 MED ORDER — CHLORHEXIDINE GLUCONATE 0.12 % MT SOLN
15.0000 mL | OROMUCOSAL | Status: AC
  Administered 2023-04-27: 15 mL via OROMUCOSAL
  Filled 2023-04-27: qty 15

## 2023-04-27 MED ORDER — INSULIN REGULAR(HUMAN) IN NACL 100-0.9 UT/100ML-% IV SOLN
INTRAVENOUS | Status: DC
  Administered 2023-04-27: 0.8 [IU]/h via INTRAVENOUS

## 2023-04-27 MED ORDER — PLASMA-LYTE A IV SOLN: INTRAVENOUS | Status: DC | PRN

## 2023-04-27 MED ORDER — PANTOPRAZOLE SODIUM 40 MG PO TBEC
40.0000 mg | DELAYED_RELEASE_TABLET | Freq: Every day | ORAL | Status: DC
  Administered 2023-04-29 – 2023-05-02 (×4): 40 mg via ORAL
  Filled 2023-04-27 (×4): qty 1

## 2023-04-27 MED ORDER — MIDAZOLAM HCL 2 MG/2ML IJ SOLN: 2.0000 mg | INTRAMUSCULAR | Status: DC | PRN

## 2023-04-27 MED ORDER — SODIUM CHLORIDE (PF) 0.9 % IJ SOLN
INTRAMUSCULAR | Status: AC
  Filled 2023-04-27: qty 10

## 2023-04-27 MED ORDER — ACETAMINOPHEN 500 MG PO TABS
1000.0000 mg | ORAL_TABLET | Freq: Four times a day (QID) | ORAL | Status: DC
  Administered 2023-04-27 – 2023-05-02 (×15): 1000 mg via ORAL
  Filled 2023-04-27 (×15): qty 2

## 2023-04-27 MED ORDER — PROTAMINE SULFATE 10 MG/ML IV SOLN
INTRAVENOUS | Status: DC | PRN
  Administered 2023-04-27: 300 mg via INTRAVENOUS

## 2023-04-27 MED ORDER — SODIUM CHLORIDE 0.9% FLUSH: 3.0000 mL | INTRAVENOUS | Status: DC | PRN

## 2023-04-27 MED ORDER — PHENYLEPHRINE HCL-NACL 20-0.9 MG/250ML-% IV SOLN
0.0000 ug/min | INTRAVENOUS | Status: DC
  Administered 2023-04-27: 100 ug/min via INTRAVENOUS
  Administered 2023-04-28: 10 ug/min via INTRAVENOUS
  Filled 2023-04-27 (×2): qty 250

## 2023-04-27 MED ORDER — PHENYLEPHRINE HCL-NACL 20-0.9 MG/250ML-% IV SOLN
INTRAVENOUS | Status: DC | PRN
  Administered 2023-04-27: 20 ug/min via INTRAVENOUS

## 2023-04-27 MED ORDER — FENTANYL CITRATE (PF) 250 MCG/5ML IJ SOLN
INTRAMUSCULAR | Status: DC | PRN
  Administered 2023-04-27 (×5): 100 ug via INTRAVENOUS
  Administered 2023-04-27: 250 ug via INTRAVENOUS
  Administered 2023-04-27: 100 ug via INTRAVENOUS
  Administered 2023-04-27: 200 ug via INTRAVENOUS
  Administered 2023-04-27 (×2): 100 ug via INTRAVENOUS

## 2023-04-27 MED ORDER — ATORVASTATIN CALCIUM 40 MG PO TABS
40.0000 mg | ORAL_TABLET | Freq: Every day | ORAL | Status: DC
  Administered 2023-04-28 – 2023-05-01 (×4): 40 mg via ORAL
  Filled 2023-04-27 (×4): qty 1

## 2023-04-27 MED ORDER — CHLORHEXIDINE GLUCONATE CLOTH 2 % EX PADS
6.0000 | MEDICATED_PAD | Freq: Every day | CUTANEOUS | Status: DC
  Administered 2023-04-28 – 2023-05-02 (×5): 6 via TOPICAL

## 2023-04-27 MED ORDER — MIDAZOLAM HCL (PF) 5 MG/ML IJ SOLN
INTRAMUSCULAR | Status: DC | PRN
  Administered 2023-04-27: 1 mg via INTRAVENOUS
  Administered 2023-04-27 (×3): 2 mg via INTRAVENOUS
  Administered 2023-04-27: 1 mg via INTRAVENOUS
  Administered 2023-04-27: 2 mg via INTRAVENOUS

## 2023-04-27 MED ORDER — MAGNESIUM SULFATE 4 GM/100ML IV SOLN
4.0000 g | Freq: Once | INTRAVENOUS | Status: AC
  Administered 2023-04-27: 4 g via INTRAVENOUS
  Filled 2023-04-27: qty 100

## 2023-04-27 MED ORDER — ROCURONIUM BROMIDE 10 MG/ML (PF) SYRINGE
PREFILLED_SYRINGE | INTRAVENOUS | Status: AC
  Filled 2023-04-27: qty 20

## 2023-04-27 MED ORDER — DEXTROSE 50 % IV SOLN: 0.0000 mL | INTRAVENOUS | Status: DC | PRN

## 2023-04-27 SURGICAL SUPPLY — 104 items
ADH SKN CLS APL DERMABOND .7 (GAUZE/BANDAGES/DRESSINGS) ×4
ANTIFOG SOL W/FOAM PAD STRL (MISCELLANEOUS) ×2
APL SWBSTK 6 STRL LF DISP (MISCELLANEOUS) ×2
APPLICATOR COTTON TIP 6 STRL (MISCELLANEOUS) IMPLANT
APPLICATOR COTTON TIP 6IN STRL (MISCELLANEOUS) ×2
BAG DECANTER FOR FLEXI CONT (MISCELLANEOUS) ×2 IMPLANT
BLADE CLIPPER SURG (BLADE) ×2 IMPLANT
BLADE STERNUM SYSTEM 6 (BLADE) ×2 IMPLANT
BLADE SURG 11 STRL SS (BLADE) IMPLANT
BNDG CMPR 5X4 KNIT ELC UNQ LF (GAUZE/BANDAGES/DRESSINGS) ×2
BNDG CMPR MED 10X6 ELC LF (GAUZE/BANDAGES/DRESSINGS) ×4
BNDG CMPR STD VLCR NS LF 5.8X4 (GAUZE/BANDAGES/DRESSINGS) ×2
BNDG ELASTIC 4INX 5YD STR LF (GAUZE/BANDAGES/DRESSINGS) IMPLANT
BNDG ELASTIC 4X5.8 VLCR NS LF (GAUZE/BANDAGES/DRESSINGS) IMPLANT
BNDG ELASTIC 4X5.8 VLCR STR LF (GAUZE/BANDAGES/DRESSINGS) ×2 IMPLANT
BNDG ELASTIC 6X10 VLCR STRL LF (GAUZE/BANDAGES/DRESSINGS) IMPLANT
BNDG GAUZE DERMACEA FLUFF 4 (GAUZE/BANDAGES/DRESSINGS) ×2 IMPLANT
BNDG GZE DERMACEA 4 6PLY (GAUZE/BANDAGES/DRESSINGS) ×6
CABLE SURGICAL S-101-97-12 (CABLE) ×2 IMPLANT
CANISTER SUCT 3000ML PPV (MISCELLANEOUS) ×2 IMPLANT
CANNULA MC2 2 STG 29/37 NON-V (CANNULA) ×2 IMPLANT
CANNULA NON VENT 20FR 12 (CANNULA) ×2 IMPLANT
CANNULA VESSEL 3MM BLUNT TIP (CANNULA) IMPLANT
CATH ROBINSON RED A/P 18FR (CATHETERS) ×4 IMPLANT
CLIP RETRACTION 3.0MM CORONARY (MISCELLANEOUS) IMPLANT
CLIP TI MEDIUM 24 (CLIP) IMPLANT
CONN ST 1/2X1/2  BEN (MISCELLANEOUS) ×2
CONN ST 1/2X1/2 BEN (MISCELLANEOUS) ×2 IMPLANT
CONNECTOR BLAKE 2:1 CARIO BLK (MISCELLANEOUS) ×2 IMPLANT
CONTAINER PROTECT SURGISLUSH (MISCELLANEOUS) ×4 IMPLANT
COVER PROBE CYLINDRICAL 5X96 (MISCELLANEOUS) IMPLANT
DERMABOND ADVANCED .7 DNX12 (GAUZE/BANDAGES/DRESSINGS) IMPLANT
DRAIN CHANNEL 19F RND (DRAIN) ×6 IMPLANT
DRAIN CONNECTOR BLAKE 1:1 (MISCELLANEOUS) ×2 IMPLANT
DRAPE CARDIOVASCULAR INCISE (DRAPES) ×2
DRAPE INCISE IOBAN 66X45 STRL (DRAPES) IMPLANT
DRAPE SRG 135X102X78XABS (DRAPES) ×2 IMPLANT
DRAPE WARM FLUID 44X44 (DRAPES) ×2 IMPLANT
DRESSING AQUACEL AG SP 3.5X10 (GAUZE/BANDAGES/DRESSINGS) IMPLANT
DRSG AQUACEL AG ADV 3.5X10 (GAUZE/BANDAGES/DRESSINGS) ×2 IMPLANT
DRSG AQUACEL AG SP 3.5X10 (GAUZE/BANDAGES/DRESSINGS) ×2
ELECT BLADE 4.0 EZ CLEAN MEGAD (MISCELLANEOUS) ×2
ELECT REM PT RETURN 9FT ADLT (ELECTROSURGICAL) ×4
ELECTRODE BLDE 4.0 EZ CLN MEGD (MISCELLANEOUS) ×2 IMPLANT
ELECTRODE REM PT RTRN 9FT ADLT (ELECTROSURGICAL) ×4 IMPLANT
FELT TEFLON 1X6 (MISCELLANEOUS) ×4 IMPLANT
GAUZE SPONGE 4X4 12PLY STRL (GAUZE/BANDAGES/DRESSINGS) ×4 IMPLANT
GLOVE BIO SURGEON STRL SZ 6.5 (GLOVE) IMPLANT
GLOVE BIO SURGEON STRL SZ7 (GLOVE) ×4 IMPLANT
GLOVE BIOGEL M STRL SZ7.5 (GLOVE) ×4 IMPLANT
GLOVE BIOGEL PI IND STRL 6 (GLOVE) IMPLANT
GLOVE BIOGEL PI IND STRL 7.5 (GLOVE) IMPLANT
GLOVE SS BIOGEL STRL SZ 6 (GLOVE) IMPLANT
GOWN STRL REUS W/ TWL LRG LVL3 (GOWN DISPOSABLE) ×8 IMPLANT
GOWN STRL REUS W/ TWL XL LVL3 (GOWN DISPOSABLE) ×4 IMPLANT
GOWN STRL REUS W/TWL LRG LVL3 (GOWN DISPOSABLE) ×16
GOWN STRL REUS W/TWL XL LVL3 (GOWN DISPOSABLE) ×2
HEMOSTAT POWDER SURGIFOAM 1G (HEMOSTASIS) ×4 IMPLANT
INSERT FOGARTY XLG (MISCELLANEOUS) IMPLANT
INSERT SUTURE HOLDER (MISCELLANEOUS) ×2 IMPLANT
KIT BASIN OR (CUSTOM PROCEDURE TRAY) ×2 IMPLANT
KIT SUCTION CATH 14FR (SUCTIONS) ×2 IMPLANT
KIT TURNOVER KIT B (KITS) ×2 IMPLANT
KIT VASOVIEW HEMOPRO 2 VH 4000 (KITS) ×2 IMPLANT
LEAD PACING MYOCARDI (MISCELLANEOUS) ×2 IMPLANT
MARKER GRAFT CORONARY BYPASS (MISCELLANEOUS) ×6 IMPLANT
NS IRRIG 1000ML POUR BTL (IV SOLUTION) ×10 IMPLANT
PACK E OPEN HEART (SUTURE) ×2 IMPLANT
PACK OPEN HEART (CUSTOM PROCEDURE TRAY) ×2 IMPLANT
PAD ARMBOARD 7.5X6 YLW CONV (MISCELLANEOUS) ×4 IMPLANT
PAD ELECT DEFIB RADIOL ZOLL (MISCELLANEOUS) ×2 IMPLANT
PENCIL BUTTON HOLSTER BLD 10FT (ELECTRODE) ×2 IMPLANT
POSITIONER ACROBAT-I OFFPUMP (MISCELLANEOUS) IMPLANT
POSITIONER HEAD DONUT 9IN (MISCELLANEOUS) ×2 IMPLANT
PUNCH AORTIC ROTATE 4.0MM (MISCELLANEOUS) ×2 IMPLANT
SET MPS 3-ND DEL (MISCELLANEOUS) IMPLANT
SOLUTION ANTFG W/FOAM PAD STRL (MISCELLANEOUS) IMPLANT
SPONGE T-LAP 18X18 ~~LOC~~+RFID (SPONGE) IMPLANT
SUPPORT HEART JANKE-BARRON (MISCELLANEOUS) ×2 IMPLANT
SUT BONE WAX W31G (SUTURE) ×2 IMPLANT
SUT ETHIBOND X763 2 0 SH 1 (SUTURE) ×4 IMPLANT
SUT ETHILON 3 0 FSL (SUTURE) IMPLANT
SUT MNCRL AB 3-0 PS2 18 (SUTURE) ×4 IMPLANT
SUT MNCRL AB 4-0 PS2 18 (SUTURE) IMPLANT
SUT PDS AB 1 CTX 36 (SUTURE) ×4 IMPLANT
SUT PROLENE 4 0 RB 1 (SUTURE) ×2
SUT PROLENE 4 0 SH DA (SUTURE) ×2 IMPLANT
SUT PROLENE 4-0 RB1 .5 CRCL 36 (SUTURE) IMPLANT
SUT PROLENE 5 0 C 1 36 (SUTURE) ×6 IMPLANT
SUT PROLENE 7 0 BV1 MDA (SUTURE) ×2 IMPLANT
SUT STEEL 6MS V (SUTURE) ×4 IMPLANT
SUT STEEL STERNAL CCS#1 18IN (SUTURE) IMPLANT
SUT STEEL SZ 6 DBL 3X14 BALL (SUTURE) IMPLANT
SUT VIC AB 2-0 CT1 27 (SUTURE) ×6
SUT VIC AB 2-0 CT1 TAPERPNT 27 (SUTURE) IMPLANT
SYSTEM SAHARA CHEST DRAIN ATS (WOUND CARE) ×2 IMPLANT
TAPE CLOTH SURG 4X10 WHT LF (GAUZE/BANDAGES/DRESSINGS) IMPLANT
TAPE PAPER 2X10 WHT MICROPORE (GAUZE/BANDAGES/DRESSINGS) IMPLANT
TOWEL GREEN STERILE (TOWEL DISPOSABLE) ×2 IMPLANT
TOWEL GREEN STERILE FF (TOWEL DISPOSABLE) ×2 IMPLANT
TRAY FOLEY SLVR 16FR TEMP STAT (SET/KITS/TRAYS/PACK) ×2 IMPLANT
TUBING LAP HI FLOW INSUFFLATIO (TUBING) ×2 IMPLANT
UNDERPAD 30X36 HEAVY ABSORB (UNDERPADS AND DIAPERS) ×2 IMPLANT
WATER STERILE IRR 1000ML POUR (IV SOLUTION) ×4 IMPLANT

## 2023-04-27 NOTE — Anesthesia Procedure Notes (Addendum)
Procedure Name: Intubation Date/Time: 04/27/2023 7:42 AM  Performed by: Cheree Ditto, CRNAPre-anesthesia Checklist: Patient identified, Emergency Drugs available, Suction available and Patient being monitored Patient Re-evaluated:Patient Re-evaluated prior to induction Oxygen Delivery Method: Circle system utilized Preoxygenation: Pre-oxygenation with 100% oxygen Induction Type: IV induction Ventilation: Mask ventilation without difficulty Grade View: Grade I Tube type: Oral Tube size: 8.0 mm Number of attempts: 1 Airway Equipment and Method: Stylet Placement Confirmation: ETT inserted through vocal cords under direct vision, positive ETCO2 and breath sounds checked- equal and bilateral Secured at: 24 cm Tube secured with: Tape Dental Injury: Teeth and Oropharynx as per pre-operative assessment

## 2023-04-27 NOTE — Consult Note (Addendum)
NAME:  Daniel Colon, MRN:  [id number], DOB:  February 28, 1946, LOS: 5 ADMISSION DATE:  04/22/2023, CONSULTATION DATE:  04/27/23 REFERRING MD:  Cliffton Asters, CHIEF COMPLAINT:  none   History of Present Illness:  77 year old man w/ hx of CAD, prior lung cancer post RULobectomy 2021 presenting for preop cardiac clearance for R THA.  Workup has since revealed severe multivessel CAD for which he underwent CABG x 4 today.  Had bilateral BSV harvest, LIMA-LAD, SVG-OM, SVG-PDA, SVG-diag.  Pump time 141 min, crossclamp 79 min.  Arrives to unit sedated on vent PCCM consulted to assist with management.  Pertinent  Medical History  HTN, HLD, NSCLC s/p curative lobectomy  Significant Hospital Events: Including procedures, antibiotic start and stop dates in addition to other pertinent events   5/7   Interim History / Subjective:  consult  Objective   Blood pressure (!) 121/59, pulse (!) 56, temperature 98.4 F (36.9 C), temperature source Oral, resp. rate 18, height 5' 8.5" (1.74 m), weight 86.6 kg, SpO2 98 %.    Vent Mode: PRVC;SIMV/PC/PS FiO2 (%):  [50 %] 50 % Set Rate:  [16 bmp] 16 bmp Vt Set:  [560 mL] 560 mL PEEP:  [5 cmH20] 5 cmH20 Pressure Support:  [10 cmH20] 10 cmH20   Intake/Output Summary (Last 24 hours) at 04/27/2023 1436 Last data filed at 04/27/2023 1354 Gross per 24 hour  Intake 1200 ml  Output 2450 ml  Net -1250 ml   Filed Weights   04/22/23 0925  Weight: 86.6 kg    Examination: General: no distress intubated, sedated HENT: ETT in place minimal secretions Lungs: clear, no wheezing Cardiovascular: RRR, ext warm, mediastinal drains x 3 in place with small sanguinous ouput Abdomen: soft, hypoactive BS Extremities: bilateral legs wrapped, pulses dopplerable Neuro: currently still paralyzed, pupils equal Skin: sternotomy site dressed without strikethrough  ABG looks okay Preop BMP okay CXR pending  Resolved Hospital Problem list   N/A  Assessment & Plan:  Severe CAD s/p  CABG x 4 Postop vent management Postop vasoplegia expected Hx NSCLC s/p RULobectomy  - Follow usual vent pathway with SIMV rapid wean - Monitor drain outputs and LE dopplers - GDMT per primary - Conservative transfusion strategy - Neo to MAP 65 - f/u CXR and ABG, adjust vent PRN  Best Practice (right click and "Reselect all SmartList Selections" daily)   Diet/type: NPO DVT prophylaxis: not indicated GI prophylaxis: PPI Lines: Central line Foley:  Yes, and it is still needed Code Status:  full code Last date of multidisciplinary goals of care discussion [per primary]  Labs   CBC: Recent Labs  Lab 04/22/23 1425 04/23/23 0400 04/25/23 0201 04/26/23 0235 04/27/23 0255 04/27/23 0755 04/27/23 1149 04/27/23 1232 04/27/23 1256 04/27/23 1337 04/27/23 1341  WBC 6.1 6.8 6.8 7.5 7.0  --   --   --   --   --   --   HGB 13.3 13.4 13.1 13.2 13.5   < > 8.2* 10.1* 8.8* 8.2* 7.1*  HCT 40.0 39.8 39.8 39.8 39.3   < > 24.0* 29.7* 26.0* 24.0* 21.0*  MCV 83.0 82.4 83.6 82.7 80.9  --   --   --   --   --   --   PLT 164 161 160 168 162  --   --  193  --   --   --    < > = values in this interval not displayed.    Basic Metabolic Panel: Recent Labs  Lab 04/22/23  1610 04/22/23 1425 04/23/23 0400 04/27/23 0255 04/27/23 0755 04/27/23 1016 04/27/23 1053 04/27/23 1058 04/27/23 1149 04/27/23 1256 04/27/23 1337 04/27/23 1341  NA 137  --  139 135   < > 138   < > 139 135 135 138 138  K 4.2  --  3.8 4.1   < > 4.3   < > 3.8 4.9 5.3* 4.4 4.5  CL 105  --  104 103   < > 106  --  101 103 104  --  106  CO2 24  --  24 25  --   --   --   --   --   --   --   --   GLUCOSE 106*  --  106* 106*   < > 93  --  100* 135* 134*  --  119*  BUN 21  --  18 29*   < > 24*  --  21 25* 26*  --  22  CREATININE 0.81   < > 0.83 1.01   < > 0.70  --  0.60* 0.80 0.80  --  0.70  CALCIUM 8.9  --  9.0 9.0  --   --   --   --   --   --   --   --    < > = values in this interval not displayed.   GFR: Estimated  Creatinine Clearance: 87.5 mL/min (by C-G formula based on SCr of 0.7 mg/dL). Recent Labs  Lab 04/23/23 0400 04/25/23 0201 04/26/23 0235 04/27/23 0255  WBC 6.8 6.8 7.5 7.0    Liver Function Tests: No results for input(s): "AST", "ALT", "ALKPHOS", "BILITOT", "PROT", "ALBUMIN" in the last 168 hours. No results for input(s): "LIPASE", "AMYLASE" in the last 168 hours. No results for input(s): "AMMONIA" in the last 168 hours.  ABG    Component Value Date/Time   PHART 7.347 (L) 04/27/2023 1337   PCO2ART 41.3 04/27/2023 1337   PO2ART 505 (H) 04/27/2023 1337   HCO3 22.7 04/27/2023 1337   TCO2 24 04/27/2023 1341   ACIDBASEDEF 3.0 (H) 04/27/2023 1337   O2SAT 100 04/27/2023 1337     Coagulation Profile: Recent Labs  Lab 04/23/23 1112  INR 1.0    Cardiac Enzymes: No results for input(s): "CKTOTAL", "CKMB", "CKMBINDEX", "TROPONINI" in the last 168 hours.  HbA1C: Hgb A1c MFr Bld  Date/Time Value Ref Range Status  04/23/2023 11:12 AM 5.2 4.8 - 5.6 % Final    Comment:    (NOTE) Pre diabetes:          5.7%-6.4%  Diabetes:              >6.4%  Glycemic control for   <7.0% adults with diabetes     CBG: Recent Labs  Lab 04/27/23 1434  GLUCAP 106*    Review of Systems:   Intubated/sedated/paralyzed  Past Medical History:  He,  has a past medical history of Allergy, Arthritis, Barrett's esophagus, CAD (coronary artery disease), Cataract, Colon polyps, Dyslipidemia, Dyspnea, GERD (gastroesophageal reflux disease), History of kidney stones, Hyperlipidemia, Hypertension, Hypogonadism in male, Myocardial infarction (HCC) (1996), Nephrolithiasis, NSCL Ca (dx'd 12/2019), Peptic ulcer disease, and PUD (peptic ulcer disease).   Surgical History:   Past Surgical History:  Procedure Laterality Date   ANGIOPLASTY     stent - rt cor. art   BILROTH II PROCEDURE     CARDIAC CATHETERIZATION     CIRCUMCISION     COLONOSCOPY     CORONARY PRESSURE/FFR STUDY N/A  04/22/2023   Procedure:  CORONARY PRESSURE/FFR STUDY;  Surgeon: Kathleene Hazel, MD;  Location: [facility] INVASIVE CV LAB;  Service: Cardiovascular;  Laterality: N/A;   INTERCOSTAL NERVE BLOCK Right 01/24/2020   Procedure: Intercostal Nerve Block;  Surgeon: Delight Ovens, MD;  Location: [facility] OR;  Service: Thoracic;  Laterality: Right;   KNEE ARTHROSCOPY Left    LEFT HEART CATH AND CORONARY ANGIOGRAPHY N/A 04/22/2023   Procedure: LEFT HEART CATH AND CORONARY ANGIOGRAPHY;  Surgeon: Kathleene Hazel, MD;  Location: [facility] INVAS[facility] CV LAB;  Service: Cardiovascular;  Laterality: N/A;   LEFT HEART CATHETERIZATION WITH CORONARY ANGIOGRAM N/A 11/17/2013   Procedure: LEFT HEART CATHETERIZATION WITH CORONARY ANGIOGRAM;  Surgeon: Micheline Chapman, MD;  Location[facility] CATH LAB;  Service: Cardiovascular;  Laterality: N/A;   LUNG LOBECTOMY  01/2020   had chemo, no radtion   LYMPH NODE DISSECTION N/A 01/24/2020   Procedure: LYMPH NODE DISSECTION;  Surgeon: Delight Ovens, MD;  Location: [facility] OR;  Service: Thoracic;  Laterality: N/A;   NECK LESION BIOPSY     POLYPECTOMY     REFRACTIVE SURGERY     SKIN BIOPSY     Back   Thigh surgery Left    Infant   ulcer surgery     ULNAR NERVE TRANSPOSITION Right 08/05/2017   Procedure: RIGHT ULNAR NERVE DECOMPRESSION;  Surgeon: Betha Loa, MD;  Location: [facility];  Service: Orthopedics;  Laterality: Right;   UPPER GI ENDOSCOPY     VIDEO BRONCHOSCOPY N/A 01/24/2020   Procedure: VIDEO BRONCHOSCOPY;  Surgeon: Delight Ovens, MD;  Location: [facility] OR;  Service: Thoracic;  Laterality: N/A;   WISDOM TOOTH EXTRACTION       Social History:   reports that he quit smoking about 11 years ago. His smoking use included cigarettes. He has a 42.00 pack-year smoking history. He has quit using smokeless tobacco.  His smokeless tobacco use included chew. He reports current alcohol use. He reports that he does not use drugs.   Family History:  His family history includes CAD (age of  onset: 56) in his sister; CAD (age of onset: 45) in his brother and sister; Cancer in his brother; Liver disease in an other family member; Sudden death (age of onset: 96) in his mother. There is no history of Colon cancer, Colon polyps, Esophageal cancer, Rectal cancer, or Stomach cancer.   Allergies No Known Allergies   Home Medications  Prior to Admission medications   Medication Sig Start Date End Date Taking? Authorizing Provider  albuterol (VENTOLIN HFA) 108 (90 Base) MCG/ACT inhaler Inhale 2 puffs into the lungs every 6 (six) hours as needed for wheezing or shortness of breath. 11/19/20  Yes [provider]  aspirin EC 81 MG tablet Take 81 mg by mouth 2 (two) times a week. In the evening on Monday and Thursday   Yes [provider]  atorvastatin (LIPITOR) 40 MG tablet TAKE 1 TABLET DAILY Patient taking differently: Take 40 mg by mouth at bedtime. 02/03/23  Yes Rollene Rotunda, MD  bisacodyl (DULCOLAX) 5 MG EC tablet Take 5 mg by mouth daily as needed for moderate constipation.   Yes [provider]  BREZTRI AEROSPHERE 160-9-4.8 MCG/ACT AERO Inhale 2 puffs into the lungs in the morning and at bedtime. 06/25/20  Yes [provider]  carvedilol (COREG) 12.5 MG tablet Take 12.5 mg by mouth at bedtime.   Yes [provider]  Coenzyme Q10 (CO Q-10) 200 MG CAPS Take 200 mg  by mouth at bedtime.   Yes [provider]  docusate sodium (COLACE) 100 MG capsule Take 100 mg by mouth at bedtime.   Yes [provider]  ezetimibe (ZETIA) 10 MG tablet Take 10 mg by mouth at bedtime.   Yes [provider]  fish oil-omega-3 fatty acids 1000 MG capsule Take 1,000 mg by mouth at bedtime.   Yes [provider]  GNP IRON 200 (65 Fe) MG TABS Take 200 mg by mouth at bedtime. 04/27/22  Yes [provider]  MICARDIS HCT 80-12.5 MG tablet TAKE 1 TABLET DAILY Patient taking differently: Take 1 tablet by mouth at bedtime. 08/10/22  Yes  Rollene Rotunda, MD  Multiple Vitamin (MULTIVITAMIN WITH MINERALS) TABS tablet Take 1 tablet by mouth daily.   Yes [provider]  polyethylene glycol (MIRALAX / GLYCOLAX) 17 g packet Take 17 g by mouth daily as needed for mild constipation.   Yes [provider]  senna (SENOKOT) 8.6 MG tablet Take 1 tablet by mouth at bedtime.   Yes [provider]  sildenafil (VIAGRA) 100 MG tablet Take 100 mg by mouth daily as needed for erectile dysfunction. 12/23/21  Yes [provider]  VITAMIN B COMPLEX-C PO Take 1 tablet by mouth at bedtime.   Yes [provider]  fluorouracil (EFUDEX) 5 % cream Apply 1 Application topically daily as needed (rash). 11/04/22   [provider]  nitroGLYCERIN (NITROSTAT) 0.4 MG SL tablet Place 1 tablet (0.4 mg total) under the tongue every 5 (five) minutes as needed for chest pain. 04/20/23 07/19/23  Rollene Rotunda, MD  omeprazole (PRILOSEC) 10 MG capsule Take 10 mg by mouth at bedtime.    [provider]     Critical care time: 31 mins

## 2023-04-27 NOTE — Progress Notes (Signed)
Extubation Procedure Note  Patient Details:   Name: Daniel Colon DOB: 22-Sep-1946 MRN: [id number]   Airway Documentation:  Airway 8 mm (Active)  Secured at (cm) 24 cm 04/27/23 1958  Measured From Teeth 04/27/23 1958  Secured Location Right 04/27/23 1930  Secured By Pink Tape 04/27/23 1958  Prone position No 04/27/23 1958  Cuff Pressure (cm H2O) Green OR 18-26 CmH2O 04/27/23 1958  Site Condition Dry 04/27/23 1930   Vent end date: (not recorded) Vent end time: (not recorded)   Evaluation  O2 sats: stable throughout Complications: No apparent complications Patient did tolerate procedure well. Bilateral Breath Sounds: Diminished   Yes  Krystle Oberman A Kourtney Terriquez 04/27/2023, 9:00 PM Pt. Was extubated to 4L [place] with no incident pt. Tolerating current settings.

## 2023-04-27 NOTE — Progress Notes (Signed)
     [address]       [place] [address]             [phone]       No events Vitals:   04/27/23 0500 04/27/23 0609  BP:  (!) 121/59  Pulse: (!) 56 (!) 56  Resp:  18  Temp:  98.4 F (36.9 C)  SpO2:  98%   Sinus brady EWOB  OR today for CABG  Kathlene Yano O Hillis Mcphatter

## 2023-04-27 NOTE — Progress Notes (Signed)
After 20 min on PS pt. Met all wean parameters TV 1.4L NIF -35 able to lift head gag positive abg results within normal limits.

## 2023-04-27 NOTE — Progress Notes (Signed)
Critical Hg 6.7 (previously 9.4) reported to Dr. Cliffton Asters. Given orders for 2u pRBC and 1u cryo.

## 2023-04-27 NOTE — Op Note (Signed)
[address]       Daniel Colon [id number]             [phone]                                          04/27/2023 Patient:  Daniel Colon Pre-Op Dx: LM/3V CAD Hx of lung cancer HTN    Post-op Dx:  same Procedure: CABG X 4.  LIMA LAD, RSVG Diag, PDA, OM3   Endoscopic greater saphenous vein harvest on the right and left   Surgeon and Role:      * Sheronda Parran, Eliezer Lofts, MD - Primary    * B. Stehler , PA-C - assisting An experienced assistant was required given the complexity of this surgery and the standard of surgical care. The assistant was needed for exposure, dissection, suctioning, retraction of delicate tissues and sutures, instrument exchange and for overall help during this procedure.    Anesthesia  general EBL:  Blood Administration: none Xclamp Time:  89 min Pump Time:   Drains: 19 F blake drain: L, mediastinal  Wires: ventricular Counts: correct   Indications: 77yo male admitted following an elective LHC which showed severe LM/3V CAD. Echo shows preserved biventricular function and no significant valvular disease. He denies any hx of chest pain, but does occasionally have dyspnea with exertion. Of note, he has also undergone a lobectomy for cancer in the past.   Findings: Frozen left chest.  Pericarditis.  Intramyocardial vessels.  Good LIMA, good vein.  Operative Technique: All invasive lines were placed in pre-op holding.  After the risks, benefits and alternatives were thoroughly discussed, the patient was brought to the operative theatre.  Anesthesia was induced, and the patient was prepped and draped in normal sterile fashion.  An appropriate surgical pause was performed, and pre-operative antibiotics were dosed accordingly.  We began with simultaneous incisions along the right leg for harvesting of the greater saphenous vein and the chest for the sternotomy.  In regards to the sternotomy, this was carried down with bovie  cautery, and the sternum was divided with a reciprocating saw.  Meticulous hemostasis was obtained.  The left internal thoracic artery was exposed and harvested in in pedicled fashion.  The patient was systemically heparinized, and the artery was divided distally, and placed in a papaverine sponge.    The sternal elevator was removed, and a retractor was placed.  The pericardium was divided in the midline and fashioned into a cradle with pericardial stitches.   After we confirmed an appropriate ACT, the ascending aorta was cannulated in standard fashion.  The right atrial appendage was used for venous cannulation site.  Cardiopulmonary bypass was initiated, and the heart retractor was placed. The cross clamp was applied, and a dose of anterograde cardioplegia was given with good arrest of the heart.  We moved to the posterior wall of the heart, and found a good target on the PDA.  An arteriotomy was made, and the vein graft was anastomosed to it in an end to side fashion.  Next we exposed the lateral wall, and found a good target on the OM3.  An end to side anastomosis with the vein graft was then created.  Next, we exposed the anterior wall of the heart and identified a good target on Diagonal.   An arteriotomy was created.  The vein was anastomosed in an end to side fashion.  Finally, we exposed a good target on the  LAD, and fashioned an end to side anastomosis between it and the LITA.  We began to re-warm, and a re-animation dose of cardioplegia was given.  The heart was de-aired, and the cross clamp was removed.  Meticulous hemostasis was obtained.    A partial occludding clamp was then placed on the ascending aorta, and we created an end to side anastomosis between it and the proximal vein grafts.  Rings were placed on the proximal anastomosis.  Hemostasis was obtained, and we separated from cardiopulmonary bypass without event.  The heparin was reversed with protamine.  Chest tubes and wires were placed,  and the sternum was re-approximated with sternal wires.  The soft tissue and skin were re-approximated wth absorbable suture.    The patient tolerated the procedure without any immediate complications, and was transferred to the ICU in guarded condition.  Daniel Colon

## 2023-04-27 NOTE — Progress Notes (Signed)
   04/27/23 1958  Vent Select  Invasive or Noninvasive Invasive  Adult Vent Y  Airway 8 mm  Placement Date/Time: 04/27/23 (c) 0742   Grade View: Grade 1  Size (mm): 8 mm  Cuffed: Min.occ.pres.  Insertion attempts: 1  Airway Equipment: Stylet  Placement Confirmation: ETCO2 (Capnography);Bilateral Breath Sounds;Direct Visualization  Secured at...  Secured at (cm) 24 cm  Measured From Teeth  Secured By Pink Tape  Prone position No  Cuff Pressure (cm H2O) Green OR 18-26 CmH2O  Adult Ventilator Settings  Vent Type Servo i  Humidity HME  Vent Mode PRVC;SIMV/PC/PS  Vt Set 560 mL  Set Rate 4 bmp  FiO2 (%) 40 %  I Time 0.9 Sec(s)  PEEP 5 cmH20  Adult Ventilator Measurements  Peak Airway Pressure 15 L/min  Mean Airway Pressure 8 cmH20  Resp Rate Spontaneous 11 br/min  Resp Rate Total 15 br/min  Exhaled Vt 425 mL  Measured Ve 9.1 L  Auto PEEP 0 cmH20  Total PEEP 5 cmH20  SpO2 100 %  Adult Ventilator Alarms  Alarms On Y  Ve High Alarm 20 L/min  Ve Low Alarm 5 L/min  Resp Rate High Alarm 36 br/min  Resp Rate Low Alarm 8  PEEP Low Alarm 2 cmH2O  Press High Alarm 40 cmH2O  T Apnea 20 sec(s)  VAP Prevention  HOB> 30 Degrees Y  Breath Sounds  Bilateral Breath Sounds Diminished  Airway Suctioning/Secretions  Suction Type ETT  Suction Device  Catheter  Secretion Amount None   Placed pt. On above settings to start rapid wean protocol. RN aware

## 2023-04-27 NOTE — Hospital Course (Addendum)
History of Present Illness:  [name] is a 77 year old gentleman with a past history notable for hypertension, dyslipidemia, COPD, non-small cell lung cancer status post robotic assisted right upper lobectomy by Dr. Nydia Bouton in 2021 known coronary artery disease status post PTCA of the RCA in 1996 in [place], with a strong positive family history for coronary artery disease and who is a former 42-pack-year cigarette smoker having quit in 2012.  He was recently being evaluated for right total hip arthroplasty.  As part of the preoperative workup he had an EKG showing new anterior T wave inversions.  He was referred to cardiology for additional evaluation prior to joint replacement.  He was seen by Dr. Antoine Poche who elicited report had been having shortness of breath with activity.  Cardiac workup followed including an echocardiogram that showed no new wall motion abnormalities and ejection fraction estimated 55 to 60%.  There was concentric left ventricular hypertrophy.  The mitral and aortic valves did not have any significant structural or functional abnormalities.  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  On 04/20/2023, [name] had a cardiac PET scan that was strongly positive for ischemia.  Dr. Antoine Poche recommended further evaluation with left heart catheterization that was carried out earlier today demonstrating severe left main and multivessel coronary artery disease.  CT surgery has been asked to evaluate [name] for consideration of coronary bypass grafting.    Hospital Course:   He was evaluated by Dr. Cliffton Asters at which time he denied any pain or shortness of breath.  His symptoms at home were primarily fatigue and shortness of breath with minimal activity.  He has never had any chest pain.  [name] continues to be followed by Dr. Shirline Frees for his non-small cell lung cancer.  He has had no evidence of recurrence.  His next CT is planned for September of this year.  It  was felt the patient would benefit from coronary bypass grafting.  The risks and benefits of the procedure were explained to the patient and he was agreeable to proceed.    The patient was taken to the operating room and underwent CABG x 4 utilizing LIMA to LAD, SVG to OM, SVG to Diagonal, SVG to PDA as well as bilateral endoscopic greater saphenous vein harvest. He tolerated the procedure well and was taken to the SICU in stable condition.  The patient was extubated the evening of surgery.  The patient developed post operative blood loss anemia with level dropping to 6.7.  He was transfused 2 units of packed cells and cryoprecipitate.  His H/H came up to 7.7 He did not require any support.  His pacing wires were removed without difficulty.  He again developed drop in hemoglobin.  He received and additional 2 units of packed cells with Hgb level of 8.0.  he was felt stable for transfer to the progressive care unit on 04/29/2023.  The patient remained in NSR.  He is ambulating without difficulty.  His surgical incisions are healing without of evidence of infection.  He is medically stable for discharge home today.

## 2023-04-27 NOTE — Progress Notes (Signed)
   04/27/23 2018  Adult Ventilator Settings  Vent Type Servo i  Humidity HME  Vent Mode PSV  FiO2 (%) 40 %  I Time 0.9 Sec(s)  Pressure Support 10 cmH20  PEEP 5 cmH20   Placed pt. On PS per rapid wean protocol PS 10/5 40%

## 2023-04-27 NOTE — Transfer of Care (Signed)
Immediate Anesthesia Transfer of Care Note  Patient: Daniel Colon  Procedure(s) Performed: CORONARY ARTERY BYPASS GRAFTING (CABG) X 4, USING LEFT INTERNAL MAMMARY ARTERY AND RIGHT LEG GREATER SAPHENOUS VEIN HARVESTED ENDOSCOPICALLY (Chest) TRANSESOPHAGEAL ECHOCARDIOGRAM  Patient Location: SICU  Anesthesia Type:General  Level of Consciousness: sedated and Patient remains intubated per anesthesia plan  Airway & Oxygen Therapy: Patient remains intubated per anesthesia plan and Patient placed on Ventilator (see vital sign flow sheet for setting)  Post-op Assessment: Report given to RN and Post -op Vital signs reviewed and stable  Post vital signs: Reviewed and stable  Last Vitals:  Vitals Value Taken Time  BP 114/53 04/27/2023 1432  Temp    Pulse 51 04/27/23 1432  Resp 18 04/27/23 1432  SpO2 100 % 04/27/23 1432  Vitals shown include unvalidated device data.  Last Pain:  Vitals:   04/27/23 0609  TempSrc: Oral  PainSc: 0-No pain      Patients Stated Pain Goal: 0 (04/26/23 4098)  Complications: No notable events documented.

## 2023-04-27 NOTE — Progress Notes (Signed)
Pt continuing to have considerable output through chest tubes. MD aware, additional PRN order for albumin given. In addition, IJ site dressing becoming saturated with blood as well as chest tube dressing with blood. Dressings changed at 1745.   See MAR, flowsheets for other documentation regarding output and dressing issues.

## 2023-04-27 NOTE — Brief Op Note (Signed)
04/22/2023 - 04/27/2023  1:56 PM  PATIENT:  Daniel Colon  77 y.o. male  PRE-OPERATIVE DIAGNOSIS:  coronary artery disease  POST-OPERATIVE DIAGNOSIS:  coronary artery disease  PROCEDURE:   CORONARY ARTERY BYPASS GRAFTING (CABG) X 4, USING LEFT INTERNAL MAMMARY ARTERY, RIGHT AND LEFT LEG GREATER SAPHENOUS VEIN HARVESTED ENDOSCOPICALLY  TRANSESOPHAGEAL ECHOCARDIOGRAM  -LIMA TO LAD -SVG TO OM -SVG TO PDA -SVG TO DIAGONAL R Vein harvest time: R Vein prep time: L Vein harvest time: L Vein Prep time:  SURGEON:  Surgeon(s) and Role:    * Lightfoot, Eliezer Lofts, MD - Primary  PHYSICIAN ASSISTANT: Aloha Gell PA-C, Erin Barrett PA-C  ASSISTANTS: Tanda Rockers RNFA   ANESTHESIA:   general  EBL:  600 mL   BLOOD ADMINISTERED:none  DRAINS:  Mediastinal and pleural drains    LOCAL MEDICATIONS USED:  NONE  SPECIMEN:  No Specimen  DISPOSITION OF SPECIMEN:  N/A  COUNTS CORRECT:  YES  DICTATION: .Dragon Dictation  PLAN OF CARE: Admit to inpatient   PATIENT DISPOSITION:  ICU - intubated and hemodynamically stable.   Delay start of Pharmacological VTE agent (>24hrs) due to surgical blood loss or risk of bleeding: yes

## 2023-04-27 NOTE — Anesthesia Procedure Notes (Addendum)
Arterial Line Insertion Start/End5/06/2023 7:00 AM, 04/27/2023 7:10 AM Performed by: Cheree Ditto, CRNA, CRNA  Patient location: Pre-op. Preanesthetic checklist: patient identified, IV checked, site marked, risks and benefits discussed, surgical consent, monitors and equipment checked, pre-op evaluation, timeout performed and anesthesia consent Lidocaine 1% used for infiltration and patient sedated Left, radial was placed Catheter size: 20 G Hand hygiene performed , maximum sterile barriers used  and Seldinger technique used Allen's test indicative of satisfactory collateral circulation Attempts: 1 Procedure performed without using ultrasound guided technique. Following insertion, dressing applied. Post procedure assessment: normal and unchanged  Patient tolerated the procedure well with no immediate complications. Additional procedure comments: Placed by Drema Balzarine, SRNA supervised by [facility] CRNA on 1st attempt. Brisk blood return noted on aspiration. Collateral circulation intact (warm skin, palpable pulses, denies parasthesias).

## 2023-04-27 NOTE — Anesthesia Postprocedure Evaluation (Signed)
Anesthesia Post Note  Patient: Daniel Colon  Procedure(s) Performed: CORONARY ARTERY BYPASS GRAFTING (CABG) X 4, USING LEFT INTERNAL MAMMARY ARTERY AND RIGHT LEG GREATER SAPHENOUS VEIN HARVESTED ENDOSCOPICALLY (Chest) TRANSESOPHAGEAL ECHOCARDIOGRAM     Patient location during evaluation: SICU Anesthesia Type: General Level of consciousness: sedated Pain management: pain level controlled Vital Signs Assessment: post-procedure vital signs reviewed and stable Respiratory status: patient remains intubated per anesthesia plan Cardiovascular status: stable Postop Assessment: no apparent nausea or vomiting Anesthetic complications: no  No notable events documented.  Last Vitals:  Vitals:   04/27/23 1515 04/27/23 1530  BP:    Pulse: (!) 47 (!) 47  Resp: 16 16  Temp: (!) 36.2 C (!) 36.1 C  SpO2: 100% 100%    Last Pain:  Vitals:   04/27/23 1445  TempSrc: Bladder  PainSc:                  Daniel Colon DANIEL

## 2023-04-28 ENCOUNTER — Inpatient Hospital Stay (HOSPITAL_COMMUNITY): Payer: Medicare Other

## 2023-04-28 ENCOUNTER — Encounter (HOSPITAL_COMMUNITY): Payer: Self-pay | Admitting: Thoracic Surgery (Cardiothoracic Vascular Surgery)

## 2023-04-28 DIAGNOSIS — Z951 Presence of aortocoronary bypass graft: Secondary | ICD-10-CM

## 2023-04-28 LAB — BASIC METABOLIC PANEL
Anion gap: 11 (ref 5–15)
Anion gap: 8 (ref 5–15)
BUN: 22 mg/dL (ref 8–23)
BUN: 32 mg/dL — ABNORMAL HIGH (ref 8–23)
CO2: 19 mmol/L — ABNORMAL LOW (ref 22–32)
CO2: 19 mmol/L — ABNORMAL LOW (ref 22–32)
Calcium: 7.7 mg/dL — ABNORMAL LOW (ref 8.9–10.3)
Calcium: 7.8 mg/dL — ABNORMAL LOW (ref 8.9–10.3)
Chloride: 106 mmol/L (ref 98–111)
Chloride: 103 mmol/L (ref 98–111)
Creatinine, Ser: 0.81 mg/dL (ref 0.61–1.24)
Creatinine, Ser: 1.46 mg/dL — ABNORMAL HIGH (ref 0.61–1.24)
GFR, Estimated: >60 mL/min (ref >60)
GFR, Estimated: 50 mL/min — ABNORMAL LOW (ref >60)
Glucose, Bld: 123 mg/dL — ABNORMAL HIGH (ref 70–99)
Glucose, Bld: 252 mg/dL — ABNORMAL HIGH (ref 70–99)
Potassium: 3.8 mmol/L (ref 3.5–5.1)
Potassium: 4.7 mmol/L (ref 3.5–5.1)
Sodium: 136 mmol/L (ref 135–145)
Sodium: 130 mmol/L — ABNORMAL LOW (ref 135–145)

## 2023-04-28 LAB — CBC
HCT: 22.8 % — ABNORMAL LOW (ref 39.0–52.0)
HCT: 21 % — ABNORMAL LOW (ref 39.0–52.0)
HCT: 18.6 % — ABNORMAL LOW (ref 39.0–52.0)
HCT: 18.4 % — ABNORMAL LOW (ref 39.0–52.0)
Hemoglobin: 7.7 g/dL — ABNORMAL LOW (ref 13.0–17.0)
Hemoglobin: 7 g/dL — ABNORMAL LOW (ref 13.0–17.0)
Hemoglobin: 6.3 g/dL — CL (ref 13.0–17.0)
Hemoglobin: 6.3 g/dL — CL (ref 13.0–17.0)
MCH: 28.4 pg (ref 26.0–34.0)
MCH: 28.8 pg (ref 26.0–34.0)
MCH: 28.5 pg (ref 26.0–34.0)
MCH: 29 pg (ref 26.0–34.0)
MCHC: 33.8 g/dL (ref 30.0–36.0)
MCHC: 33.3 g/dL (ref 30.0–36.0)
MCHC: 33.9 g/dL (ref 30.0–36.0)
MCHC: 34.2 g/dL (ref 30.0–36.0)
MCV: 84.1 fL (ref 80.0–100.0)
MCV: 86.4 fL (ref 80.0–100.0)
MCV: 84.2 fL (ref 80.0–100.0)
MCV: 84.8 fL (ref 80.0–100.0)
Platelets: 83 10*3/uL — ABNORMAL LOW (ref 150–400)
Platelets: 98 10*3/uL — ABNORMAL LOW (ref 150–400)
Platelets: 84 10*3/uL — ABNORMAL LOW (ref 150–400)
Platelets: 86 10*3/uL — ABNORMAL LOW (ref 150–400)
RBC: 2.71 MIL/uL — ABNORMAL LOW (ref 4.22–5.81)
RBC: 2.43 MIL/uL — ABNORMAL LOW (ref 4.22–5.81)
RBC: 2.21 MIL/uL — ABNORMAL LOW (ref 4.22–5.81)
RBC: 2.17 MIL/uL — ABNORMAL LOW (ref 4.22–5.81)
RDW: 14.7 % (ref 11.5–15.5)
RDW: 15.4 % (ref 11.5–15.5)
RDW: 15.5 % (ref 11.5–15.5)
RDW: 15.5 % (ref 11.5–15.5)
WBC: 6.8 10*3/uL (ref 4.0–10.5)
WBC: 10.1 10*3/uL (ref 4.0–10.5)
WBC: 7.8 10*3/uL (ref 4.0–10.5)
WBC: 8.4 10*3/uL (ref 4.0–10.5)
nRBC: 0 % (ref 0.0–0.2)
nRBC: 0 % (ref 0.0–0.2)
nRBC: 0 % (ref 0.0–0.2)
nRBC: 0 % (ref 0.0–0.2)

## 2023-04-28 LAB — GLUCOSE, CAPILLARY
Glucose-Capillary: 121 mg/dL — ABNORMAL HIGH (ref 70–99)
Glucose-Capillary: 128 mg/dL — ABNORMAL HIGH (ref 70–99)
Glucose-Capillary: 130 mg/dL — ABNORMAL HIGH (ref 70–99)
Glucose-Capillary: 133 mg/dL — ABNORMAL HIGH (ref 70–99)
Glucose-Capillary: 135 mg/dL — ABNORMAL HIGH (ref 70–99)
Glucose-Capillary: 139 mg/dL — ABNORMAL HIGH (ref 70–99)
Glucose-Capillary: 144 mg/dL — ABNORMAL HIGH (ref 70–99)
Glucose-Capillary: 146 mg/dL — ABNORMAL HIGH (ref 70–99)
Glucose-Capillary: 153 mg/dL — ABNORMAL HIGH (ref 70–99)
Glucose-Capillary: 232 mg/dL — ABNORMAL HIGH (ref 70–99)

## 2023-04-28 LAB — PREPARE CRYOPRECIPITATE: Unit division: 0

## 2023-04-28 LAB — BPAM RBC

## 2023-04-28 LAB — MAGNESIUM
Magnesium: 2.2 mg/dL (ref 1.7–2.4)
Magnesium: 2.4 mg/dL (ref 1.7–2.4)

## 2023-04-28 LAB — BPAM CRYOPRECIPITATE: Blood Product Expiration Date: 202405072359

## 2023-04-28 LAB — TYPE AND SCREEN

## 2023-04-28 LAB — PREPARE RBC (CROSSMATCH)

## 2023-04-28 MED ORDER — METOPROLOL TARTRATE 25 MG PO TABS
25.0000 mg | ORAL_TABLET | Freq: Two times a day (BID) | ORAL | Status: DC
  Administered 2023-04-28 – 2023-05-02 (×9): 25 mg via ORAL
  Filled 2023-04-28 (×9): qty 1

## 2023-04-28 MED ORDER — ENOXAPARIN SODIUM 30 MG/0.3ML IJ SOSY
30.0000 mg | PREFILLED_SYRINGE | Freq: Every day | INTRAMUSCULAR | Status: DC
  Administered 2023-04-28 – 2023-05-01 (×4): 30 mg via SUBCUTANEOUS
  Filled 2023-04-28 (×4): qty 0.3

## 2023-04-28 MED ORDER — INSULIN ASPART 100 UNIT/ML IJ SOLN
0.0000 [IU] | INTRAMUSCULAR | Status: DC
  Administered 2023-04-28 (×3): 2 [IU] via SUBCUTANEOUS
  Administered 2023-04-29: 4 [IU] via SUBCUTANEOUS
  Administered 2023-04-29 (×2): 2 [IU] via SUBCUTANEOUS

## 2023-04-28 MED ORDER — SODIUM CHLORIDE 0.9% IV SOLUTION: Freq: Once | INTRAVENOUS | Status: AC

## 2023-04-28 MED ORDER — POTASSIUM CHLORIDE 10 MEQ/100ML IV SOLN
10.0000 meq | INTRAVENOUS | Status: AC
  Administered 2023-04-28 (×3): 10 meq via INTRAVENOUS
  Filled 2023-04-28 (×2): qty 100

## 2023-04-28 NOTE — Progress Notes (Signed)
Patient ID: Daniel Colon, male   DOB: 08-May-1946, 77 y.o.   MRN: [id number]  TCTS Evening Rounds:  Hemodynamically stable in sinus rhythm.  Ambulated today.  Chest tube output decreasing.   Stable day overall.

## 2023-04-28 NOTE — Progress Notes (Signed)
V epicardial wire dc'd per MD order. Pt VSS no signs of complications at this time. Primary RN Dorene Grebe at bedside.

## 2023-04-28 NOTE — Progress Notes (Signed)
      [address]       [place] [address]             [phone]                 1 Day Post-Op Procedure(s) (LRB): CORONARY ARTERY BYPASS GRAFTING (CABG) X 4, USING LEFT INTERNAL MAMMARY ARTERY AND RIGHT LEG GREATER SAPHENOUS VEIN HARVESTED ENDOSCOPICALLY (N/A) TRANSESOPHAGEAL ECHOCARDIOGRAM (N/A)   Events: Extubated Bleeding slowed _______________________________________________________________ Vitals: BP (!) 134/59   Pulse 71   Temp 98.6 F (37 C)   Resp (!) 23   Ht 5' 8.5" (1.74 m)   Wt 89.5 kg   SpO2 96%   BMI 29.56 kg/m  Filed Weights   04/22/23 0925 04/28/23 0630  Weight: 86.6 kg 89.5 kg     - Neuro: alert NAD  - Cardiovascular: sinus  Drips: none.   CVP:  [2 mmHg-18 mmHg] 9 mmHg  - Pulm: EWOB   ABG    Component Value Date/Time   PHART 7.349 (L) 04/27/2023 2210   PCO2ART 34.4 04/27/2023 2210   PO2ART 149 (H) 04/27/2023 2210   HCO3 18.9 (L) 04/27/2023 2210   TCO2 20 (L) 04/27/2023 2210   ACIDBASEDEF 6.0 (H) 04/27/2023 2210   O2SAT 99 04/27/2023 2210    - Abd: ND - Extremity: warm  .Intake/Output      05/07 0701 05/08 0700 05/08 0701 05/09 0700   P.O. 450    I.V. (mL/kg) 2273.6 (25.4)    Blood 1001    IV Piggyback 2201.5    Total Intake(mL/kg) 5926 (66.2)    Urine (mL/kg/hr) 3375 (1.6)    Blood 600    Chest Tube 1006    Total Output 4981    Net +945            _______________________________________________________________ Labs:    Latest Ref Rng & Units 04/28/2023    4:07 AM 04/27/2023   10:10 PM 04/27/2023    8:43 PM  CBC  WBC 4.0 - 10.5 K/uL 6.8     Hemoglobin 13.0 - 17.0 g/dL 7.7  5.4  5.1   Hematocrit 39.0 - 52.0 % 22.8  16.0  15.0   Platelets 150 - 400 K/uL 83         Latest Ref Rng & Units 04/28/2023    4:07 AM 04/27/2023   10:10 PM 04/27/2023    8:43 PM  CMP  Glucose 70 - 99 mg/dL 914     BUN 8 - 23 mg/dL 22     Creatinine 7.82 - 1.24 mg/dL 9.56     Sodium 213 - 086 mmol/L 136  139  139   Potassium  3.5 - 5.1 mmol/L 3.8  4.3  4.3   Chloride 98 - 111 mmol/L 106     CO2 22 - 32 mmol/L 19     Calcium 8.9 - 10.3 mg/dL 7.7       CXR: L effusion  _______________________________________________________________  Assessment and Plan: POD 1 s/p CABG  Neuro: pain controlled CV: A/S/BB.  Will remove wires Pulm: IS, ambulation Renal: stable GI: on diet Heme: stable ID: afebrile Endo: SSI Dispo: continue ICU care   Corliss Skains 04/28/2023 8:41 AM

## 2023-04-28 NOTE — Progress Notes (Addendum)
   NAME:  Daniel Colon, MRN:  [id number], DOB:  1945/12/29, LOS: 6 ADMISSION DATE:  04/22/2023, CONSULTATION DATE:  04/27/23 REFERRING MD:  Cliffton Asters, CHIEF COMPLAINT:  none   History of Present Illness:  77 year old man w/ hx of CAD, prior lung cancer post RULobectomy 2021 presenting for preop cardiac clearance for R THA.  Workup has since revealed severe multivessel CAD for which he underwent CABG x 4 today.  Had bilateral BSV harvest, LIMA-LAD, SVG-OM, SVG-PDA, SVG-diag.  Pump time 141 min, crossclamp 79 min.  Arrives to unit sedated on vent PCCM consulted to assist with management.  Pertinent  Medical History  HTN, HLD, NSCLC s/p curative lobectomy  Significant Hospital Events: Including procedures, antibiotic start and stop dates in addition to other pertinent events   5/7 CABG x 4, extubated later that evening  Interim History / Subjective:  Extubated successfully overnight. 2u PRBC and 1 cryo overnight, Hgb appropriate this AM (5.1 to 7.7) No complaints this AM. Eager for lunch as feels hungry.  Objective   Blood pressure (!) 134/59, pulse 74, temperature 99 F (37.2 C), resp. rate (!) 21, height 5' 8.5" (1.74 m), weight 89.5 kg, SpO2 93 %. CVP:  [2 mmHg-18 mmHg] 9 mmHg  Vent Mode: PSV FiO2 (%):  [40 %-50 %] 40 % Set Rate:  [4 bmp-16 bmp] 4 bmp Vt Set:  [560 mL] 560 mL PEEP:  [5 cmH20] 5 cmH20 Pressure Support:  [10 cmH20] 10 cmH20   Intake/Output Summary (Last 24 hours) at 04/28/2023 0805 Last data filed at 04/28/2023 0700 Gross per 24 hour  Intake 5526.03 ml  Output 4981 ml  Net 545.03 ml   Filed Weights   04/22/23 0925 04/28/23 0630  Weight: 86.6 kg 89.5 kg    Examination: General: Adult male, sitting up in chair, in NAD. Neuro: A&O x 3, no deficits. HEENT: [place]/AT. Sclerae anicteric. EOMI. Cardiovascular: Sternal dressing C/D/I. Mediastinal drains in place with mod output. RRR, no M/R/G.  Lungs: Respirations even and unlabored.  CTA bilaterally, No W/R/R. Abdomen:  BS x 4, soft, NT/ND.  Musculoskeletal: No gross deformities, no edema.  Skin: Intact, warm, no rashes.   Assessment & Plan:   Severe CAD s/p CABG x 4. Hx CAD, MI, HLD, HTN. - Post op care per TCTS. - Monitor drain outputs. - Usual mobility and de-lining. - GDMT per TCTS. - Consider Lasix today after blood overnight.  Post operative blood loss anemia and consumptive thrombocytopenia - s/p 2u PRBC and 1 cryo. - Transfuse for Hgb < 7. - Monitor platelets and watch for any bleeding.   Best Practice (right click and "Reselect all SmartList Selections" daily)   Diet/type: Advance DVT prophylaxis: not indicated GI prophylaxis: PPI Lines: Central line, art line, mediastinal drains Foley:  Yes, and it is still needed Code Status:  full code Last date of multidisciplinary goals of care discussion [per primary]   Rutherford Guys, PA - C [facility] For pager details, please see AMION or use Epic chat  After 1900, please call [facility] for cross coverage needs 04/28/2023, 8:05 AM

## 2023-04-28 NOTE — Discharge Summary (Signed)
[address]       [place] [address]             [phone]    Physician Discharge Summary  Patient ID: Daniel Colon MRN: [id number] DOB/AGE: Mar 26, 1946 77 y.o.  Admit date: 04/22/2023 Discharge date: 05/02/2023  Admission Diagnoses:  Patient Active Problem List   Diagnosis Date Noted   Unstable angina (HCC) 04/22/2023   Coronary artery disease involving native coronary artery of native heart without angina pectoris 01/25/2022   SOB (shortness of breath) 01/11/2021   COPD with chronic bronchitis and emphysema (HCC) 07/03/2020   Abdominal pain 03/19/2020   Goals of care, counseling/discussion 02/13/2020   Encounter for antineoplastic chemotherapy 02/13/2020   S/P lobectomy of lung 01/24/2020   Lung cancer (HCC) 01/24/2020   Preop cardiovascular exam 01/11/2020   Educated about COVID-19 virus infection 01/11/2020   Abnormal screening CT of chest 01/05/2020   Rectal bleeding 11/13/2019   Dyslipidemia 10/01/2019   Laceration of left ring finger without foreign body without damage to nail 11/24/2017   Hypertension 11/27/2013   HLD (hyperlipidemia) 11/27/2013   GERD (gastroesophageal reflux disease) 10/30/2013   Barrett's esophagus 10/30/2013   History of adenomatous polyp of colon 10/30/2013   CAD in native artery 10/02/2013   Discharge Diagnoses:  Patient Active Problem List   Diagnosis Date Noted   S/P CABG x 4 04/27/2023   Unstable angina (HCC) 04/22/2023   Coronary artery disease involving native coronary artery of native heart without angina pectoris 01/25/2022   SOB (shortness of breath) 01/11/2021   COPD with chronic bronchitis and emphysema (HCC) 07/03/2020   Abdominal pain 03/19/2020   Goals of care, counseling/discussion 02/13/2020   Encounter for antineoplastic chemotherapy 02/13/2020   S/P lobectomy of lung 01/24/2020   Lung cancer (HCC) 01/24/2020   Preop cardiovascular exam 01/11/2020   Educated about COVID-19 virus infection  01/11/2020   Abnormal screening CT of chest 01/05/2020   Rectal bleeding 11/13/2019   Dyslipidemia 10/01/2019   Laceration of left ring finger without foreign body without damage to nail 11/24/2017   Hypertension 11/27/2013   HLD (hyperlipidemia) 11/27/2013   GERD (gastroesophageal reflux disease) 10/30/2013   Barrett's esophagus 10/30/2013   History of adenomatous polyp of colon 10/30/2013   CAD in native artery 10/02/2013   Discharged Condition: stable  History of Present Illness:  Daniel Colon is a 77 year old gentleman with a past history notable for hypertension, dyslipidemia, COPD, non-small cell lung cancer status post robotic assisted right upper lobectomy by Dr. Nydia Bouton in 2021 known coronary artery disease status post PTCA of the RCA in 1996 in [place], with a strong positive family history for coronary artery disease and who is a former 42-pack-year cigarette smoker having quit in 2012.  He was recently being evaluated for right total hip arthroplasty.  As part of the preoperative workup he had an EKG showing new anterior T wave inversions.  He was referred to cardiology for additional evaluation prior to joint replacement.  He was seen by Dr. Antoine Poche who elicited report had been having shortness of breath with activity.  Cardiac workup followed including an echocardiogram that showed no new wall motion abnormalities and ejection fraction estimated 55 to 60%.  There was concentric left ventricular hypertrophy.  The mitral and aortic valves did not have any significant structural or functional abnormalities.  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  On 04/20/2023, Daniel Colon had a cardiac PET scan  that was strongly positive for ischemia.  Dr. Antoine Poche recommended further evaluation with left heart catheterization that was carried out earlier today demonstrating severe left main and multivessel coronary artery disease.  CT surgery has been asked to evaluate Daniel Colon  for consideration of coronary bypass grafting.    Hospital Course:   He was evaluated by Dr. Cliffton Asters at which time he denied any pain or shortness of breath.  His symptoms at home were primarily fatigue and shortness of breath with minimal activity.  He has never had any chest pain.  Daniel Colon continues to be followed by Dr. Shirline Frees for his non-small cell lung cancer.  He has had no evidence of recurrence.  His next CT is planned for September of this year.  It was felt the patient would benefit from coronary bypass grafting.  The risks and benefits of the procedure were explained to the patient and he was agreeable to proceed.    The patient was taken to the operating room and underwent CABG x 4 utilizing LIMA to LAD, SVG to OM, SVG to Diagonal, SVG to PDA as well as bilateral endoscopic greater saphenous vein harvest. He tolerated the procedure well and was taken to the SICU in stable condition.  The patient was extubated the evening of surgery.  The patient developed post operative blood loss anemia with level dropping to 6.7.  He was transfused 2 units of packed cells and cryoprecipitate.  His H/H came up to 7.7 He did not require any support.  His pacing wires were removed without difficulty.  He again developed drop in hemoglobin.  He received and additional 2 units of packed cells with Hgb level of 8.0.  he was felt stable for transfer to the progressive care unit on 04/29/2023.  The patient remained in NSR.  He is ambulating without difficulty.  His surgical incisions are healing without of evidence of infection.  He is medically stable for discharge home today.  Consults: pulmonary/intensive care  Significant Diagnostic Studies: angiography:     Ost RCA lesion is 70% stenosed.   Prox RCA lesion is 50% stenosed.   Mid RCA lesion is 70% stenosed.   Mid LM to Dist LM lesion is 90% stenosed.   Ost Cx to Prox Cx lesion is 90% stenosed.   Mid Cx lesion is 80% stenosed.   Dist LM to Prox LAD  lesion is 99% stenosed.   Mid LAD lesion is 80% stenosed.   Severe distal left main stenosis Severe ostial LAD stenosis. Severe mid LAD stenosis. The distal LAD fills from antegrade flow and from right to left collaterals.  Severe ostial Circumflex stenosis. Severe stenosis mid Circumflex Large dominant RCA with moderate ostial stenosis, patent mid stented segment with moderate in stent restenosis and moderately severe mid stenosis beyond the stent (RFR 0.90 today with pressure wire analysis suggesting the disease in the RCA is borderline but likely severe enough that a bypass graft would remain patent to the distal RCA).  Normal LV filling pressures.    Recommendations: Will admit to telemetry today. Will ask CT surgery to see him to discuss CABG. He has been stable at home. Will not start IV heparin today. Will continue ASA, statin and beta blocker.    Treatments: surgery:  CABG X 4.  LIMA LAD, RSVG Diag, PDA, OM3   Endoscopic greater saphenous vein harvest on the right and left by Dr. Cliffton Asters on 04/27/2023.    Discharge Exam: Blood pressure 138/69, pulse 78, temperature 97.8  F (36.6 C), temperature source Oral, resp. rate (!) 21, height 5' 8.5" (1.74 m), weight 89.2 kg, SpO2 95 %. Cardiovascular: RRR Pulmonary: Expiratory wheezing Abdomen: Soft, non tender, bowel sounds present. Extremities: Mild bilateral lower extremity edema. Ecchymosis left thigh Wounds: Sternal and bilateral LE wounds are clean and dry.  No erythema or signs of infection. Chest tube wounds with sero sanguinous drainage     Discharge Medications:  The patient has been discharged on:   1.Beta Blocker:  Yes [ x  ]                              No   [   ]                              If No, reason:  2.Ace Inhibitor/ARB: Yes [  x ]                                     No  [    ]                                     If No, reason:  3.Statin:   Yes [  x ]                  No  [   ]                  If No,  reason:  4.Ecasa:  Yes  [ x  ]                  No   [   ]                  If No, reason:  Patient had ACS upon admission:No  Plavix/P2Y12 inhibitor: Yes [   ]                                      No  [   x]     Discharge Instructions     Amb Referral to Cardiac Rehabilitation   Complete by: As directed    Diagnosis: CABG   CABG X ___: 4   After initial evaluation and assessments completed: Virtual Based Care may be provided alone or in conjunction with Phase 2 Cardiac Rehab based on patient barriers.: Yes   Intensive Cardiac Rehabilitation (ICR) [facility] location only OR Traditional Cardiac Rehabilitation (TCR) *If criteria for ICR are not met will enroll in TCR [facility] only): Yes      Allergies as of 05/02/2023   No Known Allergies      Medication List     STOP taking these medications    carvedilol 12.5 MG tablet Commonly known as: COREG   Micardis HCT 80-12.5 MG tablet Generic drug: telmisartan-hydrochlorothiazide   nitroGLYCERIN 0.4 MG SL tablet Commonly known as: Nitrostat       TAKE these medications    albuterol 108 (90 Base) MCG/ACT inhaler Commonly known as: VENTOLIN HFA Inhale 2 puffs into the lungs every 6 (six) hours as needed for wheezing or shortness of breath.  aspirin EC 325 MG tablet Take 1 tablet (325 mg total) by mouth daily. What changed:  medication strength how much to take when to take this additional instructions   atorvastatin 40 MG tablet Commonly known as: LIPITOR TAKE 1 TABLET DAILY What changed: when to take this   bisacodyl 5 MG EC tablet Commonly known as: DULCOLAX Take 5 mg by mouth daily as needed for moderate constipation.   Breztri Aerosphere 160-9-4.8 MCG/ACT Aero Generic drug: Budeson-Glycopyrrol-Formoterol Inhale 2 puffs into the lungs in the morning and at bedtime.   Co Q-10 200 MG Caps Take 200 mg by mouth at bedtime.   docusate sodium 100 MG capsule Commonly known as: COLACE Take 100 mg by mouth at  bedtime.   ezetimibe 10 MG tablet Commonly known as: ZETIA Take 10 mg by mouth at bedtime.   fish oil-omega-3 fatty acids 1000 MG capsule Take 1,000 mg by mouth at bedtime.   fluorouracil 5 % cream Commonly known as: EFUDEX Apply 1 Application topically daily as needed (rash).   furosemide 40 MG tablet Commonly known as: LASIX Take 1 tablet (40 mg total) by mouth daily. For 5 days then stop.   GNP Iron 200 (65 Fe) MG Tabs Generic drug: Ferrous Sulfate Dried Take 200 mg by mouth at bedtime.   metoprolol tartrate 25 MG tablet Commonly known as: LOPRESSOR Take 1 tablet (25 mg total) by mouth 2 (two) times daily.   multivitamin with minerals Tabs tablet Take 1 tablet by mouth daily.   omeprazole 10 MG capsule Commonly known as: PRILOSEC Take 10 mg by mouth at bedtime.   polyethylene glycol 17 g packet Commonly known as: MIRALAX / GLYCOLAX Take 17 g by mouth daily as needed for mild constipation.   potassium chloride 10 MEQ tablet Commonly known as: KLOR-CON M Take 1 tablet (10 mEq total) by mouth daily. For 5 days then stop.   senna 8.6 MG tablet Commonly known as: SENOKOT Take 1 tablet by mouth at bedtime.   sildenafil 100 MG tablet Commonly known as: VIAGRA Take 100 mg by mouth daily as needed for erectile dysfunction.   telmisartan 40 MG tablet Commonly known as: MICARDIS Take 1 tablet (40 mg total) by mouth daily.   traMADol 50 MG tablet Commonly known as: ULTRAM Take 1 tablet (50 mg total) by mouth every 6 (six) hours as needed for moderate pain.   VITAMIN B COMPLEX-C PO Take 1 tablet by mouth at bedtime.        Follow-up Information     Lightfoot, Eliezer Lofts, MD Follow up.   Specialty: Cardiothoracic Surgery Why: This is telephone visit.Marland Kitchen Please do not need to report to the office. Dr. Cliffton Asters will call you on 05/17. The office will contact you with a time. Contact information: [address] [place] [address] [phone]          Meriam Sprague, MD. Call.   Specialties: Cardiology, Radiology Why: Please CALL for an office appointment for 2 weeks Contact information: [address] [place] [address] [phone]                 Signed:  Ardelle Balls, PA-C  05/02/2023, 10:14 AM

## 2023-04-28 NOTE — Progress Notes (Signed)
Rounding Note    Patient Name: Daniel Colon Date of Encounter: 04/28/2023  [facility] Cardiologist: None   Subjective   Doing very well this morning. Extubated, off pressors, sitting in a chair.  Inpatient Medications    Scheduled Meds:  acetaminophen  1,000 mg Oral Q6H   Or   acetaminophen (TYLENOL) oral liquid 160 mg/5 mL  1,000 mg Per Tube Q6H   aspirin EC  325 mg Oral Daily   Or   aspirin  324 mg Per Tube Daily   atorvastatin  40 mg Oral QHS   bisacodyl  10 mg Oral Daily   Or   bisacodyl  10 mg Rectal Daily   Chlorhexidine Gluconate Cloth  6 each Topical Daily   docusate sodium  200 mg Oral Daily   ezetimibe  10 mg Oral QHS   metoprolol tartrate  12.5 mg Oral BID   Or   metoprolol tartrate  12.5 mg Per Tube BID   mometasone-formoterol  2 puff Inhalation BID   [START ON 04/29/2023] pantoprazole  40 mg Oral Daily   sodium chloride flush  3 mL Intravenous Q12H   Continuous Infusions:  sodium chloride Stopped (04/28/23 0620)   sodium chloride     sodium chloride     albumin human Stopped (04/27/23 2021)    ceFAZolin (ANCEF) IV Stopped (04/28/23 0557)   dexmedetomidine (PRECEDEX) IV infusion Stopped (04/27/23 1946)   insulin 0.8 Units/hr (04/28/23 0640)   lactated ringers Stopped (04/27/23 1456)   lactated ringers 20 mL/hr at 04/28/23 0640   nitroGLYCERIN     phenylephrine (NEO-SYNEPHRINE) Adult infusion Stopped (04/28/23 0636)   PRN Meds: sodium chloride, albumin human, albuterol, dextrose, metoprolol tartrate, midazolam, morphine injection, ondansetron (ZOFRAN) IV, oxyCODONE, sodium chloride flush, traMADol   Vital Signs    Vitals:   04/28/23 0545 04/28/23 0600 04/28/23 0615 04/28/23 0630  BP:  (!) 134/59    Pulse: 74 74 76 76  Resp: 20 (!) 21 (!) 30 (!) 21  Temp: 99 F (37.2 C) 99 F (37.2 C)    TempSrc:      SpO2: 96% 96% 93% 94%  Weight:    89.5 kg  Height:    5' 8.5" (1.74 m)    Intake/Output Summary (Last 24 hours) at 04/28/2023  0644 Last data filed at 04/28/2023 0640 Gross per 24 hour  Intake 5919.1 ml  Output 4951 ml  Net 968.1 ml       04/28/2023    6:30 AM 04/22/2023    9:25 AM 04/22/2023    7:58 AM  Last 3 Weights  Weight (lbs) 197 lb 5 oz 191 lb 190 lb  Weight (kg) 89.5 kg 86.637 kg 86.183 kg      Telemetry    NSR - Personally Reviewed  ECG    NSR- Personally Reviewed  Physical Exam   GEN: No acute distress.   Neck: CVL in place Cardiac: RRR, no murmurs Respiratory: Clear bilaterally. CT in place GI: Soft, nontender, non-distended  MS: No edema; No deformity. Neuro:  Nonfocal  Psych: Normal affect   Labs    High Sensitivity Troponin:  No results for input(s): "TROPONINIHS" in the last 720 hours.   Chemistry Recent Labs  Lab 04/27/23 0255 04/27/23 0755 04/27/23 1341 04/27/23 1437 04/27/23 2004 04/27/23 2043 04/27/23 2210 04/28/23 0407  NA 135   < > 138   < > 136 139 139 136  K 4.1   < > 4.5   < >  4.5 4.3 4.3 3.8  CL 103   < > 106  --  110  --   --  106  CO2 25  --   --   --  20*  --   --  19*  GLUCOSE 106*   < > 119*  --  140*  --   --  123*  BUN 29*   < > 22  --  22  --   --  22  CREATININE 1.01   < > 0.70  --  0.83  --   --  0.81  CALCIUM 9.0  --   --   --  7.6*  --   --  7.7*  MG  --   --   --   --  3.0*  --   --  2.2  GFRNONAA >60  --   --   --  >60  --   --  >60  ANIONGAP 7  --   --   --  6  --   --  11   < > = values in this interval not displayed.     Lipids No results for input(s): "CHOL", "TRIG", "HDL", "LABVLDL", "LDLCALC", "CHOLHDL" in the last 168 hours.  Hematology Recent Labs  Lab 04/27/23 1439 04/27/23 2004 04/27/23 2043 04/27/23 2210 04/28/23 0407  WBC 12.7* 14.8*  --   --  6.8  RBC 3.43* 2.34*  --   --  2.71*  HGB 9.4* 6.7* 5.1* 5.4* 7.7*  HCT 28.9* 19.6* 15.0* 16.0* 22.8*  MCV 84.3 83.8  --   --  84.1  MCH 27.4 28.6  --   --  28.4  MCHC 32.5 34.2  --   --  33.8  RDW 14.5 14.6  --   --  14.7  PLT 136* 154  --   --  83*    Thyroid No results  for input(s): "TSH", "FREET4" in the last 168 hours.  BNPNo results for input(s): "BNP", "PROBNP" in the last 168 hours.  DDimer No results for input(s): "DDIMER" in the last 168 hours.   Radiology    DG Chest Port 1 View  Result Date: 04/27/2023 CLINICAL DATA:  Post CABG EXAM: PORTABLE CHEST 1 VIEW COMPARISON:  Portable exam 1508 hours compared to 04/23/2023 FINDINGS: Tip of endotracheal tube projects 4.8 cm above carina. Nasogastric tube extends into abdomen. RIGHT jugular line tip projects over SVC. Mediastinal drain present. Normal heart size and mediastinal contours. Pleuroparenchymal changes from lung scarring and prior surgery at RIGHT apex stable. Minimal bibasilar atelectasis. No infiltrate, pleural effusion, or pneumothorax. IMPRESSION: Expected postoperative changes. Electronically Signed   By: Ulyses Southward M.D.   On: 04/27/2023 15:25   EP STUDY  Result Date: 04/27/2023 See surgical note for result.  ECHO INTRAOPERATIVE TEE  Result Date: 04/27/2023  *INTRAOPERATIVE TRANSESOPHAGEAL REPORT *  Patient Name:   Daniel Colon Date of Exam: 04/27/2023 Medical Rec #:  [id number]       Height:       68.5 in Accession #:    [id number]      Weight:       191.0 lb Date of Birth:  [birth year]       BSA:          2.02 m Patient Age:    76 years        BP:           95/20 mmHg Patient Gender: M  HR:           49 bpm. Exam Location:  Anesthesiology Transesophogeal exam was perform intraoperatively during surgical procedure. Patient was closely monitored under general anesthesia during the entirety of examination. Indications:     CABG Sonographer:     Milda Smart Performing Phys: [id number] Eliezer Lofts LIGHTFOOT Diagnosing Phys:  Roberts MD Complications: No known complications during this procedure. POST-OP IMPRESSIONS _ Left Ventricle: The left ventricle is unchanged from pre-bypass. _ Right Ventricle: The right ventricle appears unchanged from pre-bypass. _ Aorta: The aorta appears unchanged  from pre-bypass. _ Left Atrium: The left atrium appears unchanged from pre-bypass. _ Left Atrial Appendage: The left atrial appendage appears unchanged from pre-bypass. _ Aortic Valve: The aortic valve appears unchanged from pre-bypass. _ Mitral Valve: The mitral valve appears unchanged from pre-bypass. _ Tricuspid Valve: The tricuspid valve appears unchanged from pre-bypass. _ Pulmonic Valve: The pulmonic valve appears unchanged from pre-bypass. _ Interatrial Septum: The interatrial septum appears unchanged from pre-bypass. _ Interventricular Septum: The interventricular septum appears unchanged from pre-bypass. _ Pericardium: The pericardium appears unchanged from pre-bypass. _ Comments: Good LVFx post bypass, exam otherwise exam unchanged. PRE-OP FINDINGS  Left Ventricle: The left ventricle has normal systolic function, with an ejection fraction of 55-60%. The cavity size was normal. No evidence of left ventricular regional wall motion abnormalities. There is moderate left ventricular hypertrophy. Right Ventricle: The right ventricle has normal systolic function. The cavity was normal. There is no increase in right ventricular wall thickness. Left Atrium: Left atrial size was normal in size. No left atrial/left atrial appendage thrombus was detected. Right Atrium: Right atrial size was normal in size. Interatrial Septum: Evidence of atrial level shunting detected by color flow Doppler. A small patent foramen ovale is detected. Pericardium: There is no evidence of pericardial effusion. Mitral Valve: The mitral valve is normal in structure. Mitral valve regurgitation is trivial by color flow Doppler. Tricuspid Valve: The tricuspid valve was normal in structure. Tricuspid valve regurgitation is trivial by color flow Doppler. Aortic Valve: The aortic valve is normal in structure. Aortic valve regurgitation is trivial by color flow Doppler. There is no stenosis of the aortic valve. Pulmonic Valve: The pulmonic valve  was normal in structure. Pulmonic valve regurgitation is trivial by color flow Doppler. Aorta: The aortic arch was not well visualized. There is moderate dilatation of the ascending aorta, measuring 37 mm. There is evidence of plaque in the descending aorta; Grade II, measuring 2-64mm in size.   Roberts MD Electronically signed by  Roberts MD Signature Date/Time: 04/27/2023/2:02:48 PM    Final     Cardiac Studies   CARDIAC CATH   Diagnostic Dominance: Right  Intervention    Ost RCA lesion is 70% stenosed.   Prox RCA lesion is 50% stenosed.   Mid RCA lesion is 70% stenosed.   Mid LM to Dist LM lesion is 90% stenosed.   Ost Cx to Prox Cx lesion is 90% stenosed.   Mid Cx lesion is 80% stenosed.   Dist LM to Prox LAD lesion is 99% stenosed.   Mid LAD lesion is 80% stenosed.   Severe distal left main stenosis Severe ostial LAD stenosis. Severe mid LAD stenosis. The distal LAD fills from antegrade flow and from right to left collaterals.  Severe ostial Circumflex stenosis. Severe stenosis mid Circumflex Large dominant RCA with moderate ostial stenosis, patent mid stented segment with moderate in stent restenosis and moderately severe mid stenosis beyond the stent (RFR 0.90 today  with pressure wire analysis suggesting the disease in the RCA is borderline but likely severe enough that a bypass graft would remain patent to the distal RCA).  Normal LV filling pressures.    Recommendations: Will admit to telemetry today. Will ask CT surgery to see him to discuss CABG. He has been stable at home. Will not start IV heparin today. Will continue ASA, statin and beta blocker.   TTE 03/2023: IMPRESSIONS     1. Left ventricular ejection fraction, by estimation, is 55 to 60%. The  left ventricle has normal function. The left ventricle has no regional  wall motion abnormalities. There is mild concentric left ventricular  hypertrophy. Left ventricular diastolic  parameters were normal.   2. Right  ventricular systolic function is normal. The right ventricular  size is normal.   3. Left atrial size was mildly dilated.   4. Right atrial size was mildly dilated.   5. The mitral valve is normal in structure. Trivial mitral valve  regurgitation. No evidence of mitral stenosis.   6. The aortic valve is tricuspid. There is mild calcification of the  aortic valve. Aortic valve regurgitation is trivial. No aortic stenosis is  present.   7. Aortic dilatation noted. There is borderline dilatation of the  ascending aorta, measuring 38 mm.   8. The inferior vena cava is normal in size with greater than 50%  respiratory variability, suggesting right atrial pressure of 3 mmHg.    Patient Profile     77 y.o. 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  Assessment & Plan    #Left Main and Multivessel CAD: -Patient presented for planned cath after having highly concerning NM PET as outpatient with large area of ischemia, TID and abnormal CFR -Cath revealed 99% distal LM to prox LAD as well as multivessel CAD as described above -TTE with LVEF 55-60% -Now s/p CABG with LIMA to LAD, SVG to OM, SVG to PDA, SVG to diag -Doing very well, extubated and off pressors -Post-op care per CT surgery team -Continue ASA 325mg  daily -Continue lipitor 40mg  daily -Continue zetia 10mg  daily -Continue metop 12.5mg  BID  #Post-op Anemia: -Improved s/p 1 u pRBC -Monitor H/H  #HTN: -Continue metop 12.5mg  BID  #HLD: -Continue lipitor 40mg  daily -Continue zetia 10mg  daily -Goal LDL<55  #Nonsmall cell lung carcinoma: -S/p robotic assisted right upper lobectomy  For questions or updates, please contact  [facility] Please consult [email or web address] for contact info under        Signed, Meriam Sprague, MD  04/28/2023, 6:44 AM

## 2023-04-28 NOTE — Discharge Instructions (Addendum)
Discharge Instructions:  1. You may shower, please wash incisions daily with soap and water and keep dry.  If you wish to cover wounds with dressing you may do so but please keep clean and change daily.  No tub baths or swimming until incisions have completely healed.  If your incisions become red or develop any drainage please call our office at [phone]  2. No Driving until cleared by Dr. Lucilla Lame office and you are no longer using narcotic pain medications  3. Monitor your weight daily.. Please use the same scale and weigh at same time... If you gain 5-10 lbs in 48 hours with associated lower extremity swelling, please contact our office at [phone]  4. Fever of 101.5 for at least 24 hours with no source, please contact our office at [phone]  5. Activity- up as tolerated, please walk at least 3 times per day.  Avoid strenuous activity, no lifting, pushing, or pulling with your arms over 8-10 lbs for a minimum of 6 weeks  6. Use dry 4x4s and tape for chest tube drainage. Change as needed. Once stops draining, let open to air.  7. If any questions or concerns arise, please do not hesitate to contact our office at [phone]

## 2023-04-29 ENCOUNTER — Inpatient Hospital Stay (HOSPITAL_COMMUNITY): Payer: Medicare Other

## 2023-04-29 LAB — BASIC METABOLIC PANEL
Anion gap: 8 (ref 5–15)
BUN: 31 mg/dL — ABNORMAL HIGH (ref 8–23)
CO2: 21 mmol/L — ABNORMAL LOW (ref 22–32)
Calcium: 8 mg/dL — ABNORMAL LOW (ref 8.9–10.3)
Chloride: 103 mmol/L (ref 98–111)
Creatinine, Ser: 1.01 mg/dL (ref 0.61–1.24)
GFR, Estimated: >60 mL/min (ref >60)
Glucose, Bld: 138 mg/dL — ABNORMAL HIGH (ref 70–99)
Potassium: 4 mmol/L (ref 3.5–5.1)
Sodium: 132 mmol/L — ABNORMAL LOW (ref 135–145)

## 2023-04-29 LAB — CBC
HCT: 23 % — ABNORMAL LOW (ref 39.0–52.0)
HCT: 24.9 % — ABNORMAL LOW (ref 39.0–52.0)
Hemoglobin: 8 g/dL — ABNORMAL LOW (ref 13.0–17.0)
Hemoglobin: 8.1 g/dL — ABNORMAL LOW (ref 13.0–17.0)
MCH: 28.6 pg (ref 26.0–34.0)
MCH: 27.5 pg (ref 26.0–34.0)
MCHC: 34.8 g/dL (ref 30.0–36.0)
MCHC: 32.5 g/dL (ref 30.0–36.0)
MCV: 82.1 fL (ref 80.0–100.0)
MCV: 84.4 fL (ref 80.0–100.0)
Platelets: 87 10*3/uL — ABNORMAL LOW (ref 150–400)
Platelets: 90 10*3/uL — ABNORMAL LOW (ref 150–400)
RBC: 2.8 MIL/uL — ABNORMAL LOW (ref 4.22–5.81)
RBC: 2.95 MIL/uL — ABNORMAL LOW (ref 4.22–5.81)
RDW: 18.1 % — ABNORMAL HIGH (ref 11.5–15.5)
RDW: 18 % — ABNORMAL HIGH (ref 11.5–15.5)
WBC: 8.5 10*3/uL (ref 4.0–10.5)
WBC: 9.3 10*3/uL (ref 4.0–10.5)
nRBC: 0 % (ref 0.0–0.2)
nRBC: 0 % (ref 0.0–0.2)

## 2023-04-29 LAB — TYPE AND SCREEN
ABO/RH(D): A NEG
Antibody Screen: NEGATIVE

## 2023-04-29 LAB — BPAM RBC: Unit Type and Rh: 600

## 2023-04-29 LAB — GLUCOSE, CAPILLARY
Glucose-Capillary: 143 mg/dL — ABNORMAL HIGH (ref 70–99)
Glucose-Capillary: 197 mg/dL — ABNORMAL HIGH (ref 70–99)
Glucose-Capillary: 103 mg/dL — ABNORMAL HIGH (ref 70–99)
Glucose-Capillary: 106 mg/dL — ABNORMAL HIGH (ref 70–99)
Glucose-Capillary: 117 mg/dL — ABNORMAL HIGH (ref 70–99)

## 2023-04-29 MED ORDER — INSULIN ASPART 100 UNIT/ML IJ SOLN
0.0000 [IU] | Freq: Three times a day (TID) | INTRAMUSCULAR | Status: DC
  Administered 2023-04-30: 2 [IU] via SUBCUTANEOUS

## 2023-04-29 MED ORDER — SODIUM CHLORIDE 0.9% FLUSH: 3.0000 mL | INTRAVENOUS | Status: DC | PRN

## 2023-04-29 MED ORDER — ORAL CARE MOUTH RINSE: 15.0000 mL | OROMUCOSAL | Status: DC | PRN

## 2023-04-29 MED ORDER — SODIUM CHLORIDE 0.9% FLUSH
3.0000 mL | Freq: Two times a day (BID) | INTRAVENOUS | Status: DC
  Administered 2023-04-29 – 2023-05-02 (×7): 3 mL via INTRAVENOUS

## 2023-04-29 MED ORDER — ~~LOC~~ CARDIAC SURGERY, PATIENT & FAMILY EDUCATION
Freq: Once | Status: AC
  Administered 2023-04-29: 1

## 2023-04-29 MED ORDER — FUROSEMIDE 10 MG/ML IJ SOLN
40.0000 mg | Freq: Once | INTRAMUSCULAR | Status: AC
  Administered 2023-04-29: 40 mg via INTRAVENOUS
  Filled 2023-04-29: qty 4

## 2023-04-29 MED ORDER — SODIUM CHLORIDE 0.9 % IV SOLN: 250.0000 mL | INTRAVENOUS | Status: DC | PRN

## 2023-04-29 MED FILL — Potassium Chloride Inj 2 mEq/ML: INTRAVENOUS | Qty: 40 | Status: AC

## 2023-04-29 MED FILL — Lidocaine HCl Local Preservative Free (PF) Inj 2%: INTRAMUSCULAR | Qty: 14 | Status: AC

## 2023-04-29 MED FILL — Heparin Sodium (Porcine) Inj 1000 Unit/ML: Qty: 1000 | Status: AC

## 2023-04-29 NOTE — Progress Notes (Signed)
      [address]       [place] [address]             [phone]                 2 Days Post-Op Procedure(s) (LRB): CORONARY ARTERY BYPASS GRAFTING (CABG) X 4, USING LEFT INTERNAL MAMMARY ARTERY AND RIGHT LEG GREATER SAPHENOUS VEIN HARVESTED ENDOSCOPICALLY (N/A) TRANSESOPHAGEAL ECHOCARDIOGRAM (N/A)   Events: No events  _______________________________________________________________ Vitals: BP 123/60   Pulse 73   Temp 98.5 F (36.9 C) (Oral)   Resp (!) 30   Ht 5' 8.5" (1.74 m)   Wt 87.8 kg   SpO2 93%   BMI 29.00 kg/m  Filed Weights   04/22/23 0925 04/28/23 0630 04/29/23 0500  Weight: 86.6 kg 89.5 kg 87.8 kg     - Neuro: alert NAD  - Cardiovascular: sinus  Drips: none.   CVP:  [5 mmHg-9 mmHg] 5 mmHg  - Pulm: EWOB   ABG    Component Value Date/Time   PHART 7.349 (L) 04/27/2023 2210   PCO2ART 34.4 04/27/2023 2210   PO2ART 149 (H) 04/27/2023 2210   HCO3 18.9 (L) 04/27/2023 2210   TCO2 20 (L) 04/27/2023 2210   ACIDBASEDEF 6.0 (H) 04/27/2023 2210   O2SAT 99 04/27/2023 2210    - Abd: ND - Extremity: warm  .Intake/Output      05/08 0701 05/09 0700 05/09 0701 05/10 0700   P.O. 960    I.V. (mL/kg) 529 (6)    Blood 630    IV Piggyback 488.7    Total Intake(mL/kg) 2607.6 (29.7)    Urine (mL/kg/hr) 210 (0.1)    Blood     Chest Tube 230    Total Output 440    Net +2167.6         Urine Occurrence 5 x       _______________________________________________________________ Labs:    Latest Ref Rng & Units 04/29/2023    4:30 AM 04/28/2023   11:15 PM 04/28/2023   10:12 PM  CBC  WBC 4.0 - 10.5 K/uL 8.5  8.4  7.8   Hemoglobin 13.0 - 17.0 g/dL 8.0  6.3  6.3   Hematocrit 39.0 - 52.0 % 23.0  18.4  18.6   Platelets 150 - 400 K/uL 87  86  84       Latest Ref Rng & Units 04/29/2023    4:30 AM 04/28/2023    5:19 PM 04/28/2023    4:07 AM  CMP  Glucose 70 - 99 mg/dL 086  578  469   BUN 8 - 23 mg/dL 31  32  22   Creatinine 0.61 - 1.24 mg/dL 6.29   5.28  4.13   Sodium 135 - 145 mmol/L 132  130  136   Potassium 3.5 - 5.1 mmol/L 4.0  4.7  3.8   Chloride 98 - 111 mmol/L 103  103  106   CO2 22 - 32 mmol/L 21  19  19    Calcium 8.9 - 10.3 mg/dL 8.0  7.8  7.7     CXR: L effusion  _______________________________________________________________  Assessment and Plan: POD 2 s/p CABG  Neuro: pain controlled CV: A/S/BB.   Pulm: IS, ambulation Renal: stable GI: on diet Heme: stable.  Received 2 units overnight ID: afebrile Endo: SSI Dispo: floor   Corliss Skains 04/29/2023 7:34 AM

## 2023-04-29 NOTE — Progress Notes (Signed)
04/29/2023 Case discussed with Dr. Cliffton Asters.  Doing well, needed a couple more units of blood to catch up with postop losses; going to floor, PCCM will be available PRN.  Myrla Halsted MD PCCM

## 2023-04-30 LAB — GLUCOSE, CAPILLARY
Glucose-Capillary: 106 mg/dL — ABNORMAL HIGH (ref 70–99)
Glucose-Capillary: 117 mg/dL — ABNORMAL HIGH (ref 70–99)
Glucose-Capillary: 125 mg/dL — ABNORMAL HIGH (ref 70–99)
Glucose-Capillary: 109 mg/dL — ABNORMAL HIGH (ref 70–99)

## 2023-04-30 LAB — BASIC METABOLIC PANEL
Anion gap: 5 (ref 5–15)
BUN: 27 mg/dL — ABNORMAL HIGH (ref 8–23)
CO2: 23 mmol/L (ref 22–32)
Calcium: 7.9 mg/dL — ABNORMAL LOW (ref 8.9–10.3)
Chloride: 105 mmol/L (ref 98–111)
Creatinine, Ser: 0.83 mg/dL (ref 0.61–1.24)
GFR, Estimated: >60 mL/min (ref >60)
Glucose, Bld: 124 mg/dL — ABNORMAL HIGH (ref 70–99)
Potassium: 3.6 mmol/L (ref 3.5–5.1)
Sodium: 133 mmol/L — ABNORMAL LOW (ref 135–145)

## 2023-04-30 LAB — CBC
HCT: 23.4 % — ABNORMAL LOW (ref 39.0–52.0)
Hemoglobin: 7.9 g/dL — ABNORMAL LOW (ref 13.0–17.0)
MCH: 28.4 pg (ref 26.0–34.0)
MCHC: 33.8 g/dL (ref 30.0–36.0)
MCV: 84.2 fL (ref 80.0–100.0)
Platelets: 102 10*3/uL — ABNORMAL LOW (ref 150–400)
RBC: 2.78 MIL/uL — ABNORMAL LOW (ref 4.22–5.81)
RDW: 18 % — ABNORMAL HIGH (ref 11.5–15.5)
WBC: 8.4 10*3/uL (ref 4.0–10.5)
nRBC: 0.2 % (ref 0.0–0.2)

## 2023-04-30 LAB — TYPE AND SCREEN
Antibody Screen: NEGATIVE
Unit division: 0
Unit division: 0
Unit division: 0
Unit division: 0

## 2023-04-30 LAB — BPAM RBC
Blood Product Expiration Date: 202406032359
Blood Product Expiration Date: 202406032359
Blood Product Expiration Date: 202406092359
ISSUE DATE / TIME: 202405072054
ISSUE DATE / TIME: 202405090035
Unit Type and Rh: 600
Unit Type and Rh: 600

## 2023-04-30 MED ORDER — POTASSIUM CHLORIDE CRYS ER 20 MEQ PO TBCR
40.0000 meq | EXTENDED_RELEASE_TABLET | Freq: Every day | ORAL | Status: DC
  Administered 2023-04-30 – 2023-05-02 (×3): 40 meq via ORAL
  Filled 2023-04-30 (×3): qty 2

## 2023-04-30 MED ORDER — FUROSEMIDE 40 MG PO TABS
40.0000 mg | ORAL_TABLET | Freq: Every day | ORAL | Status: DC
  Administered 2023-04-30 – 2023-05-02 (×3): 40 mg via ORAL
  Filled 2023-04-30 (×3): qty 1

## 2023-04-30 MED ORDER — POTASSIUM CHLORIDE CRYS ER 20 MEQ PO TBCR
20.0000 meq | EXTENDED_RELEASE_TABLET | ORAL | Status: AC
  Administered 2023-04-30 (×3): 20 meq via ORAL
  Filled 2023-04-30 (×3): qty 1

## 2023-04-30 NOTE — Progress Notes (Signed)
Patient transferred to 4E-20. All patient belongings gathered and sent with patient. Bedside RN Eli called report to receiving unit.

## 2023-04-30 NOTE — Progress Notes (Signed)
Pt admitted to rm 20 from [facility]. CHG wipe given. Initiated tele. VSS. Oriented pt to the unit. Call bel within reach.   Lawson Radar, RN

## 2023-04-30 NOTE — Progress Notes (Signed)
      [address]       [place] [address]             [phone]                 3 Days Post-Op Procedure(s) (LRB): CORONARY ARTERY BYPASS GRAFTING (CABG) X 4, USING LEFT INTERNAL MAMMARY ARTERY AND RIGHT LEG GREATER SAPHENOUS VEIN HARVESTED ENDOSCOPICALLY (N/A) TRANSESOPHAGEAL ECHOCARDIOGRAM (N/A)   Events: No events  _______________________________________________________________ Vitals: BP 129/61   Pulse 84   Temp 97.9 F (36.6 C) (Oral)   Resp 19   Ht 5' 8.5" (1.74 m)   Wt 90.1 kg   SpO2 97%   BMI 29.76 kg/m  Filed Weights   04/28/23 0630 04/29/23 0500 04/30/23 0500  Weight: 89.5 kg 87.8 kg 90.1 kg     - Neuro: alert NAD  - Cardiovascular: sinus  Drips: none.      - Pulm: EWOB   ABG    Component Value Date/Time   PHART 7.349 (L) 04/27/2023 2210   PCO2ART 34.4 04/27/2023 2210   PO2ART 149 (H) 04/27/2023 2210   HCO3 18.9 (L) 04/27/2023 2210   TCO2 20 (L) 04/27/2023 2210   ACIDBASEDEF 6.0 (H) 04/27/2023 2210   O2SAT 99 04/27/2023 2210    - Abd: ND - Extremity: warm  .Intake/Output      05/09 0701 05/10 0700 05/10 0701 05/11 0700   P.O. 1020    I.V. (mL/kg) 7.2 (0.1)    Blood     IV Piggyback     Total Intake(mL/kg) 1027.2 (11.4)    Urine (mL/kg/hr) 1900 (0.9)    Stool 0    Chest Tube 50    Total Output 1950    Net -922.8         Urine Occurrence 2 x    Stool Occurrence 1 x       _______________________________________________________________ Labs:    Latest Ref Rng & Units 04/30/2023    2:17 AM 04/29/2023    8:08 AM 04/29/2023    4:30 AM  CBC  WBC 4.0 - 10.5 K/uL 8.4  9.3  8.5   Hemoglobin 13.0 - 17.0 g/dL 7.9  8.1  8.0   Hematocrit 39.0 - 52.0 % 23.4  24.9  23.0   Platelets 150 - 400 K/uL 102  90  87       Latest Ref Rng & Units 04/30/2023    2:17 AM 04/29/2023    4:30 AM 04/28/2023    5:19 PM  CMP  Glucose 70 - 99 mg/dL 086  578  469   BUN 8 - 23 mg/dL 27  31  32   Creatinine 0.61 - 1.24 mg/dL 6.29  5.28   4.13   Sodium 135 - 145 mmol/L 133  132  130   Potassium 3.5 - 5.1 mmol/L 3.6  4.0  4.7   Chloride 98 - 111 mmol/L 105  103  103   CO2 22 - 32 mmol/L 23  21  19    Calcium 8.9 - 10.3 mg/dL 7.9  8.0  7.8     CXR: L effusion  _______________________________________________________________  Assessment and Plan: POD 3 s/p CABG  Neuro: pain controlled CV: A/S/BB.   Pulm: IS, ambulation Renal: stable GI: on diet Heme: stable.   ID: afebrile Endo: SSI Dispo: awaiting floor bed   Corliss Skains 04/30/2023 8:36 AM

## 2023-04-30 NOTE — Progress Notes (Addendum)
Pt received OHS book and education on restrictions, heart healthy diet, ex guidelines, Move in the Tube sheet, incentive spirometer use when d/c and CRPII. Pt denies questions and was encouraged to look in the book for additional information. Referral placed to [facility].   Pt and wife state they do not need any DME from insurance as they have a RW and shower chair at home.  Pt moving well post-surgery, pt ambulating independently with EVA. He has not ambulated w/o EVA in the hallway, pt states he's ready to use RW. Wlll walk with pt later.   Faustino Congress MS, ACSM-CEP 04/30/2023 8:52 AM

## 2023-04-30 NOTE — Progress Notes (Signed)
CARDIAC REHAB PHASE I   PRE:  Rate/Rhythm: 84 NSR  BP:  Sitting: 114/54      SaO2: 93 RA  MODE:  Ambulation: 170 ft   AD:  None  POST:  Rate/Rhythm: 86 NSR  BP:  Sitting: 110/42      SaO2: 93 RA  Pt amb with standby assistance, pt denies CP and SOB during amb and was returned to room w/o complaint. No RW in room so pt walked w/o AD. 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 Pt and wife deny questions from education.   Daniel Congress MS ACSM-CEP 1:26 PM 04/30/2023    Service time is from 1307 to 1330.

## 2023-05-01 ENCOUNTER — Inpatient Hospital Stay (HOSPITAL_COMMUNITY): Payer: Medicare Other

## 2023-05-01 LAB — GLUCOSE, CAPILLARY
Glucose-Capillary: 113 mg/dL — ABNORMAL HIGH (ref 70–99)
Glucose-Capillary: 160 mg/dL — ABNORMAL HIGH (ref 70–99)

## 2023-05-01 MED ORDER — IRBESARTAN 150 MG PO TABS
75.0000 mg | ORAL_TABLET | Freq: Every day | ORAL | Status: DC
  Administered 2023-05-01 – 2023-05-02 (×2): 75 mg via ORAL
  Filled 2023-05-01 (×2): qty 1

## 2023-05-01 NOTE — Progress Notes (Addendum)
      [address]       [place] [address]             [phone]        4 Days Post-Op Procedure(s) (LRB): CORONARY ARTERY BYPASS GRAFTING (CABG) X 4, USING LEFT INTERNAL MAMMARY ARTERY AND RIGHT LEG GREATER SAPHENOUS VEIN HARVESTED ENDOSCOPICALLY (N/A) TRANSESOPHAGEAL ECHOCARDIOGRAM (N/A)  Subjective: Patient sitting on edge of bed. He has no specific complaint this am. He has already had a bowel movement.  Objective: Vital signs in last 24 hours: Temp:  [97.8 F (36.6 C)-98.1 F (36.7 C)] 97.9 F (36.6 C) (05/11 0250) Pulse Rate:  [74-87] 74 (05/11 0250) Cardiac Rhythm: Normal sinus rhythm (05/10 2000) Resp:  [17-27] 20 (05/11 0250) BP: (114-141)/(42-68) 132/63 (05/11 0250) SpO2:  [93 %-95 %] 94 % (05/11 0250)  Pre op weight 86.6 kg (?) Current Weight  04/30/23 90.1 kg      Intake/Output from previous day: 05/10 0701 - 05/11 0700 In: 600 [P.O.:600] Out: -    Physical Exam:  Cardiovascular: RRR Pulmonary: Slightly diminished at bases Abdomen: Soft, non tender, bowel sounds present. Extremities: Mild bilateral lower extremity edema. Ecchymosis left thigh Wounds: Sternal and LE wounds are clean and dry.  No erythema or signs of infection. Chest tube wounds with sero sanguinous drainage  Lab Results: CBC: Recent Labs    04/29/23 0808 04/30/23 0217  WBC 9.3 8.4  HGB 8.1* 7.9*  HCT 24.9* 23.4*  PLT 90* 102*   BMET:  Recent Labs    04/29/23 0430 04/30/23 0217  NA 132* 133*  K 4.0 3.6  CL 103 105  CO2 21* 23  GLUCOSE 138* 124*  BUN 31* 27*  CREATININE 1.01 0.83  CALCIUM 8.0* 7.9*    PT/INR:  Lab Results  Component Value Date   INR 1.5 (H) 04/27/2023   INR 1.0 04/23/2023   INR 1.0 01/22/2020   ABG:  INR: Will add last result for INR, ABG once components are confirmed Will add last 4 CBG results once components are confirmed  Assessment/Plan:  1. CV - SR with HR in the 80's this am. On Lopressor 25 mg bid. Will  restart low dose Micardis for better BP control. 2.  Pulmonary - On room air. Check PA/LAT CXR this am.  Encourage incentive spirometer. 3. Volume Overload - On Lasix 40 mg daily 4.  Expected post op acute blood loss anemia - H and H yesterday 7.9 and 23.4 5. Thrombocytopenia-platelets yesterday up to 102,000 5. CBGs 125/109/113. He has no history of DM. Pre op HGA1C 5.2. Stop accu checks and SS PRN. 6. 4x4s, abd pad for chest tube wounds. Change PRN. 7. Hope to discharge in am  Donielle M ZimmermanPA-C 8:08 AM   Chart reviewed, patient examined, agree with above. He looks good overall. Plan home tomorrow if no changes.

## 2023-05-01 NOTE — Progress Notes (Addendum)
CARDIAC REHAB PHASE I   PRE:  Rate/Rhythm: 78 SR  BP:  Sitting: 136/67      SaO2: 94 RA  MODE:  Ambulation: 200 ft   POST:  Rate/Rhythm: 94 SR  BP:  Sitting: 165/77      SaO2: 90 RA  Pt tolerated exercise well and amb 200 ft with stand-by assist. Pt denies CP or dizziness throughout walk. Pt did have SOB. O2 sat 87-89% RA. Waveform not good with artifact on pulse ox. Told pt to pursed lip breath as O2 sat rose from 87% to 89% Pt to bed with call bell in reach. O2 sat 90% in bed. Encouraged IS use. Pt and wife did not have any questions about ed. Will continue to follow.  [id number] Joya San, MS, ACSM-CEP 05/01/2023 9:31 AM

## 2023-05-01 NOTE — Plan of Care (Signed)
°  Problem: Education: °Goal: Knowledge of General Education information will improve °Description: Including pain rating scale, medication(s)/side effects and non-pharmacologic comfort measures °Outcome: Progressing °  °Problem: Clinical Measurements: °Goal: Cardiovascular complication will be avoided °Outcome: Progressing °  °Problem: Activity: °Goal: Risk for activity intolerance will decrease °Outcome: Progressing °  °

## 2023-05-02 LAB — CBC
HCT: 23.8 % — ABNORMAL LOW (ref 39.0–52.0)
Hemoglobin: 7.6 g/dL — ABNORMAL LOW (ref 13.0–17.0)
MCH: 27.2 pg (ref 26.0–34.0)
MCHC: 31.9 g/dL (ref 30.0–36.0)
MCV: 85.3 fL (ref 80.0–100.0)
Platelets: 193 10*3/uL (ref 150–400)
RBC: 2.79 MIL/uL — ABNORMAL LOW (ref 4.22–5.81)
RDW: 18.3 % — ABNORMAL HIGH (ref 11.5–15.5)
WBC: 9.7 10*3/uL (ref 4.0–10.5)
nRBC: 0.2 % (ref 0.0–0.2)

## 2023-05-02 LAB — BASIC METABOLIC PANEL WITH GFR
Anion gap: 9 (ref 5–15)
BUN: 22 mg/dL (ref 8–23)
CO2: 25 mmol/L (ref 22–32)
Calcium: 8.4 mg/dL — ABNORMAL LOW (ref 8.9–10.3)
Chloride: 103 mmol/L (ref 98–111)
Creatinine, Ser: 0.88 mg/dL (ref 0.61–1.24)
GFR, Estimated: >60 mL/min
Glucose, Bld: 120 mg/dL — ABNORMAL HIGH (ref 70–99)
Potassium: 4.3 mmol/L (ref 3.5–5.1)
Sodium: 137 mmol/L (ref 135–145)

## 2023-05-02 MED ORDER — METOPROLOL TARTRATE 25 MG PO TABS: 25.0000 mg | ORAL_TABLET | Freq: Two times a day (BID) | ORAL | 1 refills | Status: DC

## 2023-05-02 MED ORDER — ALBUTEROL SULFATE (2.5 MG/3ML) 0.083% IN NEBU
2.5000 mg | INHALATION_SOLUTION | RESPIRATORY_TRACT | Status: AC
  Administered 2023-05-02: 2.5 mg via RESPIRATORY_TRACT
  Filled 2023-05-02: qty 3

## 2023-05-02 MED ORDER — ASPIRIN 325 MG PO TBEC: 325.0000 mg | DELAYED_RELEASE_TABLET | Freq: Every day | ORAL | Status: DC

## 2023-05-02 MED ORDER — TRAMADOL HCL 50 MG PO TABS: 50.0000 mg | ORAL_TABLET | Freq: Four times a day (QID) | ORAL | 0 refills | Status: DC | PRN

## 2023-05-02 MED ORDER — TELMISARTAN 40 MG PO TABS: 40.0000 mg | ORAL_TABLET | Freq: Every day | ORAL | 1 refills | Status: DC

## 2023-05-02 MED ORDER — FUROSEMIDE 40 MG PO TABS: 40.0000 mg | ORAL_TABLET | Freq: Every day | ORAL | 0 refills | Status: DC

## 2023-05-02 MED ORDER — POTASSIUM CHLORIDE CRYS ER 10 MEQ PO TBCR: 10.0000 meq | EXTENDED_RELEASE_TABLET | Freq: Every day | ORAL | 0 refills | Status: DC

## 2023-05-02 NOTE — TOC Transition Note (Signed)
Transition of Care (TOC) - CM/SW Discharge Note Donn Pierini RN, BSN Transitions of Care Unit 4E- RN Case Manager See Treatment Team for direct phone #   Patient Details  Name: Daniel Colon MRN: [id number] Date of Birth: March 16, 1946  Transition of Care [facility]) CM/SW Contact:  Daniel Span, RN Phone Number: 05/02/2023, 10:50 AM   Clinical Narrative:    Pt stable for transition home today, TOC Notified by Iantha Fallen liaison Bjorn Loser) -following patient with MD office protocol referral prearranged for [facility] needs  CM has notified liaison- Bjorn Loser of transition home today for [facility] start of care and follow up.   No further TOC needs noted, family to transport home.    Final next level of care: Home w Home Health Services Barriers to Discharge: Barriers Resolved   Patient Goals and CMS Choice    Office protocol referral  Discharge Placement               Home w/ [facility]          Discharge Plan and Services Additional resources added to the After Visit Summary for     Discharge Planning Services: CM Consult Post Acute Care Choice: Home Health          DME Arranged: N/A DME Agency: NA         HH Agency: [facility] Date [facility] Agency Contacted: 05/02/23 Time HH Agency Contacted: 1050 Representative spoke with at [facility] Agency: Bjorn Loser  Social Determinants of Health (SDOH) Interventions SDOH Screenings   Food Insecurity: No Food Insecurity (04/25/2023)  Housing: Low Risk  (04/25/2023)  Transportation Needs: No Transportation Needs (04/25/2023)  Utilities: Not At Risk (04/25/2023)  Depression (PHQ2-9): Low Risk  (08/08/2020)  Tobacco Use: Medium Risk (04/28/2023)     Readmission Risk Interventions    05/02/2023   10:50 AM  Readmission Risk Prevention Plan  Transportation Screening Complete  PCP or Specialist Appt within 5-7 Days Complete  Home Care Screening Complete  Medication Review (RN CM) Complete

## 2023-05-02 NOTE — Progress Notes (Addendum)
      [address]       [place] [address]             [phone]        5 Days Post-Op Procedure(s) (LRB): CORONARY ARTERY BYPASS GRAFTING (CABG) X 4, USING LEFT INTERNAL MAMMARY ARTERY AND RIGHT LEG GREATER SAPHENOUS VEIN HARVESTED ENDOSCOPICALLY (N/A) TRANSESOPHAGEAL ECHOCARDIOGRAM (N/A)  Subjective: Patient has no specific complaint this am. He hopes to go home.   Objective: Vital signs in last 24 hours: Temp:  [97.7 F (36.5 C)-97.8 F (36.6 C)] 97.7 F (36.5 C) (05/12 0441) Pulse Rate:  [70-78] 71 (05/12 0441) Cardiac Rhythm: Other (Comment) (05/11 2028) Resp:  [20] 20 (05/12 0441) BP: (127-140)/(61-69) 136/67 (05/12 0441) SpO2:  [93 %-95 %] 93 % (05/12 0441) Weight:  [89.2 kg] 89.2 kg (05/12 0441)  Pre op weight 86.6 kg (?) Current Weight  05/02/23 89.2 kg      Intake/Output from previous day: 05/11 0701 - 05/12 0700 In: 360 [P.O.:360] Out: -    Physical Exam:  Cardiovascular: RRR Pulmonary: Expiratory wheezing Abdomen: Soft, non tender, bowel sounds present. Extremities: Mild bilateral lower extremity edema. Ecchymosis left thigh Wounds: Sternal and bilateral LE wounds are clean and dry.  No erythema or signs of infection. Chest tube wounds with sero sanguinous drainage  Lab Results: CBC: Recent Labs    04/30/23 0217 05/02/23 0127  WBC 8.4 9.7  HGB 7.9* 7.6*  HCT 23.4* 23.8*  PLT 102* 193    BMET:  Recent Labs    04/30/23 0217 05/02/23 0127  NA 133* 137  K 3.6 4.3  CL 105 103  CO2 23 25  GLUCOSE 124* 120*  BUN 27* 22  CREATININE 0.83 0.88  CALCIUM 7.9* 8.4*     PT/INR:  Lab Results  Component Value Date   INR 1.5 (H) 04/27/2023   INR 1.0 04/23/2023   INR 1.0 01/22/2020   ABG:  INR: Will add last result for INR, ABG once components are confirmed Will add last 4 CBG results once components are confirmed  Assessment/Plan:  1. CV - SR, first degree heart block this am. On Lopressor 25 mg bid and Avapro  75 mg daily (substitute for Micardis) 2.  Pulmonary - On room air.  Albuterol PRN wheezing. Encourage incentive spirometer. 3. Volume Overload - On Lasix 40 mg daily 4.  Expected post op acute blood loss anemia - H and H this am stable at 7.6 and 23.8 5. Thrombocytopenia resolved-platelets up to 193,000 6. On Lovenox for DVT prophylaxis 7. Likely discharge later today  Daniel M ZimmermanPA-C 8:05 AM   Chart reviewed, patient examined, agree with above. He is doing well and can go home today.

## 2023-05-02 NOTE — Progress Notes (Signed)
AVS given and reviewed with pt. Medications discussed. Gauze dressing changed prior to discharge due to drainage. Supplies provided to pt. All questions answered to satisfaction. Pt verbalized understanding of information given. Pt escorted off the unit with all belongings via wheelchair by staff member.

## 2023-05-02 NOTE — Plan of Care (Signed)
  Problem: Pain Managment: Goal: General experience of comfort will improve Outcome: Progressing   Problem: Safety: Goal: Ability to remain free from injury will improve Outcome: Progressing   Problem: Skin Integrity: Goal: Risk for impaired skin integrity will decrease Outcome: Progressing   

## 2023-05-05 ENCOUNTER — Encounter (HOSPITAL_COMMUNITY): Admission: RE | Payer: Self-pay | Source: Ambulatory Visit

## 2023-05-05 ENCOUNTER — Ambulatory Visit (HOSPITAL_COMMUNITY): Admission: RE | Admit: 2023-05-05 | Payer: Medicare Other | Source: Ambulatory Visit | Admitting: Orthopedic Surgery

## 2023-05-05 ENCOUNTER — Other Ambulatory Visit: Payer: Self-pay | Admitting: Cardiology

## 2023-05-05 SURGERY — ARTHROPLASTY, HIP, TOTAL, ANTERIOR APPROACH
Anesthesia: Choice | Site: Hip | Laterality: Right

## 2023-05-06 ENCOUNTER — Telehealth: Payer: Self-pay

## 2023-05-06 DIAGNOSIS — K5909 Other constipation: Secondary | ICD-10-CM | POA: Diagnosis not present

## 2023-05-06 DIAGNOSIS — Z951 Presence of aortocoronary bypass graft: Secondary | ICD-10-CM | POA: Diagnosis not present

## 2023-05-06 DIAGNOSIS — I10 Essential (primary) hypertension: Secondary | ICD-10-CM | POA: Diagnosis not present

## 2023-05-06 DIAGNOSIS — J439 Emphysema, unspecified: Secondary | ICD-10-CM | POA: Diagnosis not present

## 2023-05-06 DIAGNOSIS — C3411 Malignant neoplasm of upper lobe, right bronchus or lung: Secondary | ICD-10-CM | POA: Diagnosis not present

## 2023-05-06 DIAGNOSIS — E785 Hyperlipidemia, unspecified: Secondary | ICD-10-CM | POA: Diagnosis not present

## 2023-05-06 DIAGNOSIS — D649 Anemia, unspecified: Secondary | ICD-10-CM | POA: Diagnosis not present

## 2023-05-06 DIAGNOSIS — I2510 Atherosclerotic heart disease of native coronary artery without angina pectoris: Secondary | ICD-10-CM | POA: Diagnosis not present

## 2023-05-06 NOTE — Telephone Encounter (Signed)
Luisa Hart RN Case Manager with [facility] contacted the office. Patient is s/p CABG with Dr. Cliffton Asters 04/27/23. States that patient was seen in the home today for start of care and patient as well as his wife declined care of home health services.

## 2023-05-07 ENCOUNTER — Encounter: Payer: Self-pay | Admitting: Thoracic Surgery (Cardiothoracic Vascular Surgery)

## 2023-05-07 ENCOUNTER — Telehealth: Payer: Medicare Other | Admitting: Thoracic Surgery (Cardiothoracic Vascular Surgery)

## 2023-05-07 ENCOUNTER — Ambulatory Visit (INDEPENDENT_AMBULATORY_CARE_PROVIDER_SITE_OTHER): Payer: Self-pay | Admitting: Thoracic Surgery (Cardiothoracic Vascular Surgery)

## 2023-05-07 VITALS — BP 103/55 | HR 80 | Resp 20 | Ht 68.5 in | Wt 199.0 lb

## 2023-05-07 DIAGNOSIS — Z951 Presence of aortocoronary bypass graft: Secondary | ICD-10-CM

## 2023-05-07 MED ORDER — FUROSEMIDE 40 MG PO TABS: 40.0000 mg | ORAL_TABLET | Freq: Every day | ORAL | 0 refills | Status: DC

## 2023-05-07 NOTE — Progress Notes (Signed)
      [address]       [place] [address]             [phone]        [facility] Medical Record #[id number] Date of Birth: 1946-03-02  Referring: Meriam Sprague, MD Primary Care: Garlan Fillers, MD Primary Cardiologist:None  Reason for visit:   follow-up  History of Present Illness:     77yo male presents for his 1st follow-up.  Overall doing well.    Physical Exam: BP (!) 103/55 (BP Location: Left Arm, Patient Position: Sitting, Cuff Size: Normal)   Pulse 80   Resp 20   Ht 5' 8.5" (1.74 m)   Wt 199 lb (90.3 kg)   SpO2 95% Comment: RA  BMI 29.82 kg/m   Alert NAD Incision clean.  Sternum stable Abdomen, ND trace peripheral edema      Assessment / Plan:   77yo male s/p CABG.  Doing well.  Another Rx for Lasix.  He will follow-up in 1 month with a cxr   Corliss Skains 05/07/2023 12:57 PM

## 2023-05-08 ENCOUNTER — Other Ambulatory Visit: Payer: Self-pay | Admitting: Physician Assistant

## 2023-05-10 ENCOUNTER — Encounter: Payer: Self-pay | Admitting: Cardiology

## 2023-05-12 ENCOUNTER — Other Ambulatory Visit: Payer: Self-pay

## 2023-05-12 ENCOUNTER — Other Ambulatory Visit (HOSPITAL_COMMUNITY): Payer: Medicare Other

## 2023-05-12 MED ORDER — METOPROLOL TARTRATE 25 MG PO TABS: 25.0000 mg | ORAL_TABLET | Freq: Two times a day (BID) | ORAL | 3 refills | Status: DC

## 2023-05-12 MED FILL — Sodium Chloride IV Soln 0.9%: INTRAVENOUS | Qty: 3000 | Status: AC

## 2023-05-12 MED FILL — Heparin Sodium (Porcine) Inj 1000 Unit/ML: INTRAMUSCULAR | Qty: 20 | Status: AC

## 2023-05-12 MED FILL — Calcium Chloride Inj 10%: INTRAVENOUS | Qty: 10 | Status: AC

## 2023-05-12 MED FILL — Electrolyte-R (PH 7.4) Solution: INTRAVENOUS | Qty: 4000 | Status: AC

## 2023-05-12 MED FILL — Mannitol IV Soln 20%: INTRAVENOUS | Qty: 500 | Status: AC

## 2023-05-12 MED FILL — Sodium Bicarbonate IV Soln 8.4%: INTRAVENOUS | Qty: 50 | Status: AC

## 2023-05-14 ENCOUNTER — Encounter: Payer: Self-pay | Admitting: Cardiology

## 2023-05-18 ENCOUNTER — Ambulatory Visit (INDEPENDENT_AMBULATORY_CARE_PROVIDER_SITE_OTHER): Payer: Self-pay | Admitting: Physician Assistant

## 2023-05-18 ENCOUNTER — Ambulatory Visit
Admission: RE | Admit: 2023-05-18 | Discharge: 2023-05-18 | Disposition: A | Discharge status: Home / Self Care | Payer: Medicare Other | Source: Ambulatory Visit | Attending: Thoracic Surgery (Cardiothoracic Vascular Surgery) | Admitting: Thoracic Surgery (Cardiothoracic Vascular Surgery)

## 2023-05-18 ENCOUNTER — Telehealth: Payer: Self-pay | Admitting: *Deleted

## 2023-05-18 ENCOUNTER — Other Ambulatory Visit: Payer: Self-pay | Admitting: *Deleted

## 2023-05-18 VITALS — BP 95/59 | HR 75 | Resp 20 | Ht 68.0 in | Wt 202.0 lb

## 2023-05-18 DIAGNOSIS — Z951 Presence of aortocoronary bypass graft: Secondary | ICD-10-CM

## 2023-05-18 DIAGNOSIS — R0602 Shortness of breath: Secondary | ICD-10-CM | POA: Diagnosis not present

## 2023-05-18 LAB — BASIC METABOLIC PANEL
BUN: 21 mg/dL (ref 7–25)
CO2: 23 mmol/L (ref 20–32)
Calcium: 8.5 mg/dL — ABNORMAL LOW (ref 8.6–10.3)
Chloride: 104 mmol/L (ref 98–110)
Creat: 0.98 mg/dL (ref 0.70–1.28)
Glucose, Bld: 110 mg/dL (ref 65–139)
Potassium: 3.8 mmol/L (ref 3.5–5.3)
Sodium: 137 mmol/L (ref 135–146)

## 2023-05-18 MED ORDER — LACTULOSE 20 GM/30ML PO SOLN: 30.0000 mL | Freq: Every day | ORAL | 1 refills | Status: DC | PRN

## 2023-05-18 MED ORDER — FUROSEMIDE 40 MG PO TABS: 40.0000 mg | ORAL_TABLET | Freq: Every day | ORAL | 1 refills | Status: DC

## 2023-05-18 MED ORDER — METOLAZONE 5 MG PO TABS: 5.0000 mg | ORAL_TABLET | Freq: Every day | ORAL | 0 refills | Status: DC

## 2023-05-18 MED ORDER — POTASSIUM CHLORIDE ER 20 MEQ PO TBCR
20.0000 meq | EXTENDED_RELEASE_TABLET | Freq: Every day | ORAL | 1 refills | Status: DC
Start: 2023-05-18 — End: 2023-06-23

## 2023-05-18 NOTE — Progress Notes (Unsigned)
Cardiology Office Note:    Date:  05/19/2023   ID:  Daniel Colon, DOB 01-03-1946, MRN [id number]  PCP:  Garlan Fillers, MD [facility] Cardiologist: Rollene Rotunda, MD   Reason for visit: Hospital follow-up  History of Present Illness:    Daniel Colon is a 77 y.o. male with a hx of CAD, s/p stent to RCA in 1996 in [place] and non-small cell lung cancer status post status post robotic assisted right upper lobectomy by Dr. Nydia Bouton in 2021 & chemotherapy, 42-pack-year cigarette smoker having quit in 2012, hypertension, hyperlipidemia.  He saw Dr. Antoine Poche in March 2024 for preop clearance prior to possible hip surgery.  Patient noted some decreased exercise tolerance, muscle weakness and some dyspnea/chest discomfort on exertion.  He underwent cardiac PET which showed high risk findings including decrease in EF with stress, TID, large perfusion defect.  He then underwent left heart catheterization on Apr 22, 2023 showing three-vessel disease was admitted for CABG workup.  He underwent CABG x 4 utilizing LIMA to LAD, SVG to OM, SVG to Diagonal, SVG to PDA.  With postop blood loss, he was transfused 2 units and later another 2 units.  He remained in normal sinus rhythm.  Dr. Antoine Poche recommended waiting 2 months until hip surgery.  Carvedilol and Micardis-HCTZ were discontinued.  Discharged on aspirin 325 mg daily.  Discharged on Micardis 40mg  daily and metoprolol tartrate 25 mg twice daily.  He was seen by CT surgery yesterday for complaints of shortness of breath, weight gain and leg swelling.  He contacted office today with complaints of shortness of breath and weight gain.  Recheck BMET showed stable creatinine.  Patient was prescribed Lasix and Zaroxolyn x 3 days as well as lactulose to help with constipation.  Today, patient complains of dyspnea on exertion and leg swelling following surgery.  He states that at bad times he will feel like he is huffing and puffing after walking  from the bed to the living room.  Other times he can walk half a mile.  He has noted weight gain with a baseline weight of 190-191 pounds.  Weight today 198lbs. He has been on Lasix 40 mg daily pretty consistently since discharge.  He is picking up his Zaroxolyn 3-day course today.  He has noticed leg swelling worsening over a week ago.  He did have some wheezing last week that has since resolved.  He has noticed his oxygen dropping to 84 to 86% with ambulation.  Oxygen is 94 to 96% at rest.  Blood pressure at home well-controlled in the 110s over 70s.  Patient denies palpitations and chest pain.  CT surgery noticed that he had borderline blood pressure at appointment yesterday and stopped his Micardis.  He denies bleeding issues.  He denies fever and chills.  Sternal incision healing well.    Past Medical History:  Diagnosis Date   Allergy    Arthritis    Barrett's esophagus    CAD (coronary artery disease)    a. Stent to the RCA 1996 (in [place]);  b. ETT(11/13/13): abnormal with early ST changes to suggest ischemia;   c. LHC (11/17/13):  pLAD 30-40, D1 30, mLAD 70-75, mCFX 70, mRCA stent 40 (ISR), EF 55-65%.  Medical Rx   Cataract    left eye removed, right immature   Colon polyps    Dyslipidemia    Dyspnea    GERD (gastroesophageal reflux disease)    History of kidney stones    Hyperlipidemia  Hypertension    Hypogonadism in male    Myocardial infarction [facility]) 1996   Nephrolithiasis    kidney stone   NSCL Ca dx'd 12/2019   Peptic ulcer disease    PUD (peptic ulcer disease)     Past Surgical History:  Procedure Laterality Date   ANGIOPLASTY     stent - rt cor. art   BILROTH II PROCEDURE     CARDIAC CATHETERIZATION     CIRCUMCISION     COLONOSCOPY     CORONARY ARTERY BYPASS GRAFT N/A 04/27/2023   Procedure: CORONARY ARTERY BYPASS GRAFTING (CABG) X 4, USING LEFT INTERNAL MAMMARY ARTERY AND RIGHT LEG GREATER SAPHENOUS VEIN HARVESTED ENDOSCOPICALLY;  Surgeon: Corliss Skains,  MD;  Location: [facility] OR;  Service: Open Heart Surgery;  Laterality: N/A;   CORONARY PRESSURE/FFR STUDY N/A 04/22/2023   Procedure: CORONARY PRESSURE/FFR STUDY;  Surgeon: Kathleene Hazel, MD;  Location: [facility] INVASIVE CV LAB;  Service: Cardiovascular;  Laterality: N/A;   INTERCOSTAL NERVE BLOCK Right 01/24/2020   Procedure: Intercostal Nerve Block;  Surgeon: Delight Ovens, MD;  Location: [facility] OR;  Service: Thoracic;  Laterality: Right;   KNEE ARTHROSCOPY Left    LEFT HEART CATH AND CORONARY ANGIOGRAPHY N/A 04/22/2023   Procedure: LEFT HEART CATH AND CORONARY ANGIOGRAPHY;  Surgeon: Kathleene Hazel, MD;  Location: [facility] INVASIVE CV LAB;  Service: Cardiovascular;  Laterality: N/A;   LEFT HEART CATHETERIZATION WITH CORONARY ANGIOGRAM N/A 11/17/2013   Procedure: LEFT HEART CATHETERIZATION WITH CORONARY ANGIOGRAM;  Surgeon: Micheline Chapman, MD;  Location: [facility] CATH LAB;  Service: Cardiovascular;  Laterality: N/A;   LUNG LOBECTOMY  01/2020   had chemo, no radtion   LYMPH NODE DISSECTION N/A 01/24/2020   Procedure: LYMPH NODE DISSECTION;  Surgeon: Delight Ovens, MD;  Location: [facility] OR;  Service: Thoracic;  Laterality: N/A;   NECK LESION BIOPSY     POLYPECTOMY     REFRACTIVE SURGERY     SKIN BIOPSY     Back   TEE WITHOUT CARDIOVERSION N/A 04/27/2023   Procedure: TRANSESOPHAGEAL ECHOCARDIOGRAM;  Surgeon: Corliss Skains, MD;  Location: [facility] OR;  Service: Open Heart Surgery;  Laterality: N/A;   Thigh surgery Left    Infant   ulcer surgery     ULNAR NERVE TRANSPOSITION Right 08/05/2017   Procedure: RIGHT ULNAR NERVE DECOMPRESSION;  Surgeon: Betha Loa, MD;  Location:  SURGERY CENTER;  Service: Orthopedics;  Laterality: Right;   UPPER GI ENDOSCOPY     VIDEO BRONCHOSCOPY N/A 01/24/2020   Procedure: VIDEO BRONCHOSCOPY;  Surgeon: Delight Ovens, MD;  Location: [facility] OR;  Service: Thoracic;  Laterality: N/A;   WISDOM TOOTH EXTRACTION      Current Medications: Current Meds   Medication Sig   acetaminophen (TYLENOL) 650 MG CR tablet Take 650 mg by mouth every 8 (eight) hours as needed for pain.   albuterol (VENTOLIN HFA) 108 (90 Base) MCG/ACT inhaler Inhale 2 puffs into the lungs every 6 (six) hours as needed for wheezing or shortness of breath.   aspirin EC 325 MG tablet Take 1 tablet (325 mg total) by mouth daily.   atorvastatin (LIPITOR) 40 MG tablet Take 1 tablet (40 mg total) by mouth at bedtime.   bisacodyl (DULCOLAX) 5 MG EC tablet Take 5 mg by mouth daily as needed for moderate constipation.   BREZTRI AEROSPHERE 160-9-4.8 MCG/ACT AERO Inhale 2 puffs into the lungs in the morning and at bedtime.   Coenzyme Q10 (CO Q-10) 200 MG CAPS  Take 200 mg by mouth at bedtime.   docusate sodium (COLACE) 100 MG capsule Take 100 mg by mouth at bedtime.   ezetimibe (ZETIA) 10 MG tablet Take 10 mg by mouth at bedtime.   fish oil-omega-3 fatty acids 1000 MG capsule Take 1,000 mg by mouth at bedtime.   fluorouracil (EFUDEX) 5 % cream Apply 1 Application topically daily as needed (rash).   furosemide (LASIX) 40 MG tablet Take 1 tablet (40 mg total) by mouth daily.   GNP IRON 200 (65 Fe) MG TABS Take 200 mg by mouth at bedtime.   Lactulose 20 GM/30ML SOLN Take 30 mLs (20 g total) by mouth daily as needed.   metolazone (ZAROXOLYN) 5 MG tablet Take 1 tablet (5 mg total) by mouth daily.   metoprolol tartrate (LOPRESSOR) 25 MG tablet Take 1 tablet (25 mg total) by mouth 2 (two) times daily.   Multiple Vitamin (MULTIVITAMIN WITH MINERALS) TABS tablet Take 1 tablet by mouth daily.   omeprazole (PRILOSEC) 10 MG capsule Take 10 mg by mouth at bedtime.   polyethylene glycol (MIRALAX / GLYCOLAX) 17 g packet Take 17 g by mouth daily as needed for mild constipation.   potassium chloride 20 MEQ TBCR Take 1 tablet (20 mEq total) by mouth daily.   senna (SENOKOT) 8.6 MG tablet Take 1 tablet by mouth at bedtime.   sildenafil (VIAGRA) 100 MG tablet Take 100 mg by mouth daily as needed for  erectile dysfunction.   traMADol (ULTRAM) 50 MG tablet Take 1 tablet (50 mg total) by mouth every 6 (six) hours as needed for moderate pain.   VITAMIN B COMPLEX-C PO Take 1 tablet by mouth at bedtime.     Allergies:   Patient has no known allergies.   Social History   Socioeconomic History   Marital status: Married    Spouse name: Not on file   Number of children: 1   Years of education: Not on file   Highest education level: Not on file  Occupational History   Occupation: retired  Tobacco Use   Smoking status: Former    Packs/day: 1.00    Years: 42.00    Additional pack years: 0.00    Total pack years: 42.00    Types: Cigarettes    Quit date: 06/21/2011    Years since quitting: 11.9   Smokeless tobacco: Former    Types: Associate Professor Use: Never used  Substance and Sexual Activity   Alcohol use: Yes    Alcohol/week: 0.0 standard drinks of alcohol    Comment: social   Drug use: No   Sexual activity: Not on file  Other Topics Concern   Not on file  Social History Narrative   Lives with wife and mother in law.    Social Determinants of Health   Financial Resource Strain: Not on file  Food Insecurity: No Food Insecurity (04/25/2023)   Hunger Vital Sign    Worried About Running Out of Food in the Last Year: Never true    Ran Out of Food in the Last Year: Never true  Transportation Needs: No Transportation Needs (04/25/2023)   PRAPARE - Administrator, Civil Service (Medical): No    Lack of Transportation (Non-Medical): No  Physical Activity: Not on file  Stress: Not on file  Social Connections: Not on file     Family History: The patient's family history includes CAD (age of onset: 40) in his sister; CAD (age of onset:  50) in his brother and sister; Cancer in his brother; Liver disease in an other family member; Sudden death (age of onset: 49) in his mother. There is no history of Colon cancer, Colon polyps, Esophageal cancer, Rectal cancer, or  Stomach cancer.  ROS:   Please see the history of present illness.     EKGs/Labs/Other Studies Reviewed:    Recent Labs: 09/04/2022: ALT 36 04/28/2023: Magnesium 2.4 05/02/2023: Hemoglobin 7.6; Platelets 193 05/18/2023: BUN 21; Creat 0.98; Potassium 3.8; Sodium 137   Recent Lipid Panel No results found for: "CHOL", "TRIG", "HDL", "LDLCALC", "LDLDIRECT"  Physical Exam:    VS:  BP 130/76   Pulse (!) 51   Ht 5' 8.5" (1.74 m)   Wt 201 lb (91.2 kg)   SpO2 95%   BMI 30.12 kg/m    No data found.   Wt Readings from Last 3 Encounters:  05/19/23 201 lb (91.2 kg)  05/18/23 202 lb (91.6 kg)  05/07/23 199 lb (90.3 kg)     GEN:  Well nourished, well developed in no acute distress HEENT: Normal NECK: No JVD; No carotid bruits CARDIAC: RRR, no murmurs, rubs, gallops RESPIRATORY:  Clear to auscultation without rales, wheezing or rhonchi  ABDOMEN: Soft, non-tender, non-distended MUSCULOSKELETAL: 1+ lower extremity edema below bilateral shins, wearing compression stockings SKIN: Warm and dry NEUROLOGIC:  Alert and oriented PSYCHIATRIC:  Normal affect    ASSESSMENT AND PLAN   Acute diastolic heart failure -IntraOp echo showed EF 55 to 60%.  Moderate LVH.  No significant valve disease. 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 -Prescribed 3-day course of metolazone 5 mg.  Advised him to take 30 to 60 minutes before his morning Lasix.  Check BMET on Monday.  Postop blood loss -Concerned that his shortness of breath is from his anemia.  His discharge hemoglobin was 7.6.  Check CBC today. -Patient denies gross blood loss. -He is currently on aspirin 325 mg daily.  Coronary artery disease status post CABG -stent to RCA in 1996 in [place]  -CABG x 4 utilizing LIMA to LAD, SVG to OM, SVG to Diagonal, SVG to PDA -Denies chest pain. -Continue aspirin and statin therapy.,  Hypertension, well-controlled -Monitor blood pressure off Micardis. -Goal BP is <130/80.  Recommend DASH diet (high in  vegetables, fruits, low-fat dairy products, whole grains, poultry, fish, and nuts and low in sweets, sugar-sweetened beverages, and red meats), salt restriction and increase physical activity.  Hyperlipidemia with goal LDL less than 70 -LDL 53 in January 2024.  Continue Lipitor and Zetia. -Discussed cholesterol lowering diets - Mediterranean diet, DASH diet, vegetarian diet, low-carbohydrate diet and avoidance of trans fats.  Discussed healthier choice substitutes.  Nuts, high-fiber foods, and fiber supplements may also improve lipids.    Chronic constipation -Patient reports constipation since lobectomy in 2021.   -Patient is picking up lactulose today.  Agree with CT surgery that patient should follow-up with GI.  Disposition - Patient has follow-up with Dr. Cliffton Asters on June 7.  Follow-up with me in 4 to 6 weeks.  Follow-up with Dr. Antoine Poche in 3 to 4 months.  Agree patient should wait at least 2-3 months before considering hip surgery.   Medication Adjustments/Labs and Tests Ordered: Current medicines are reviewed at length with the patient today.  Concerns regarding medicines are outlined above.  Orders Placed This Encounter  Procedures   CBC   Basic metabolic panel   No orders of the defined types were placed in this encounter.   Patient Instructions  Medication Instructions:  Take Metolazone 30-60 minutes prior to taking Lasix. *If you need a refill on your cardiac medications before your next appointment, please call your pharmacy*   Lab Work: CBC: Today BMET: To Be done On Monday. If you have labs (blood work) drawn today and your tests are completely normal, you will receive your results only by: MyChart Message (if you have MyChart) OR A paper copy in the mail If you have any lab test that is abnormal or we need to change your treatment, we will call you to review the results.   Testing/Procedures: No Testing    Follow-Up: At [facility], you and your  health needs are our priority.  As part of our continuing mission to provide you with exceptional heart care, we have created designated Provider Care Teams.  These Care Teams include your primary Cardiologist (physician) and Advanced Practice Providers (APPs -  Physician Assistants and Nurse Practitioners) who all work together to provide you with the care you need, when you need it.  We recommend signing up for the patient portal called "MyChart".  Sign up information is provided on this After Visit Summary.  MyChart is used to connect with patients for Virtual Visits (Telemedicine).  Patients are able to view lab/test results, encounter notes, upcoming appointments, etc.  Non-urgent messages can be sent to your provider as well.   To learn more about what you can do with MyChart, go to [email or web address].    Your next appointment:   4-6 week(s)  Provider:   [name], PA-C    Then, Rollene Rotunda, MD will plan to see you again in 3-4 month(s).    Signed, Cannon Kettle, PA-C  05/19/2023 10:56 AM    [facility]

## 2023-05-18 NOTE — Telephone Encounter (Signed)
Patient and wife contacted the office stating patient is experiencing fluid retention and SOB. Per wife, patient is elevating legs at rest and is continues to have swelling in bilateral legs and ankles. States weight has increased about 1lb per day with recordings of 200lb today, 197lb, 198lb, 197lb, 196lb. States patient has been watching his sodium intake and has not been eating as much food recently. States he becomes SOB very quickly with activity. Patient states he was at [facility] yesterday on the stationary bike and was unable to tolerate riding very long. Advised patient to not ride a bike at this time until cleared to do so. Patient stated he was SOB just from talking. Patient states he has been taking lasix daily since hospital discharge despite only being provided a short course of diuretic. Wife states she has been giving patient her rx of lasix. Advised patient we can schedule him to be seen by a PA in clinic today for assessment. Patient aware to get chest xray prior to appt. Advised to also contact Cardiology for sooner post-op appt than 6/27. Wife and patient verbalized agreement.

## 2023-05-18 NOTE — Progress Notes (Signed)
[address]       [place] [address]             [phone]       HPI:  Patient is S/P CABG x 4 performed on 04/27/2023.  He contacted office today with complaints of shortness of breath and weight gain.  The patient's wife states the patient has been gaining weight about 1 lbs per day.  He has been taking his wife's Lasix and elevating his legs without improvement in symptoms.  They present today for evaluation.  The patient states initially he was doing very well post discharge.  He notes that as of the past few days he has been unable to walk as far as he previously could.  He states that he has been slowly gaining weight despite lasix.  He has pain along his right lower abdomen, that is present since his previous lung surgery.  He has chronic constipation also.  Current Outpatient Medications  Medication Sig Dispense Refill   albuterol (VENTOLIN HFA) 108 (90 Base) MCG/ACT inhaler Inhale 2 puffs into the lungs every 6 (six) hours as needed for wheezing or shortness of breath.     aspirin EC 325 MG tablet Take 1 tablet (325 mg total) by mouth daily.     atorvastatin (LIPITOR) 40 MG tablet Take 1 tablet (40 mg total) by mouth at bedtime. 90 tablet 3   bisacodyl (DULCOLAX) 5 MG EC tablet Take 5 mg by mouth daily as needed for moderate constipation.     BREZTRI AEROSPHERE 160-9-4.8 MCG/ACT AERO Inhale 2 puffs into the lungs in the morning and at bedtime.     Coenzyme Q10 (CO Q-10) 200 MG CAPS Take 200 mg by mouth at bedtime.     docusate sodium (COLACE) 100 MG capsule Take 100 mg by mouth at bedtime.     ezetimibe (ZETIA) 10 MG tablet Take 10 mg by mouth at bedtime.     fish oil-omega-3 fatty acids 1000 MG capsule Take 1,000 mg by mouth at bedtime.     fluorouracil (EFUDEX) 5 % cream Apply 1 Application topically daily as needed (rash).     furosemide (LASIX) 40 MG tablet Take 1 tablet (40 mg total) by mouth daily. For 5 days then stop. 7 tablet 0   GNP IRON 200 (65  Fe) MG TABS Take 200 mg by mouth at bedtime.     metoprolol tartrate (LOPRESSOR) 25 MG tablet Take 1 tablet (25 mg total) by mouth 2 (two) times daily. 180 tablet 3   Multiple Vitamin (MULTIVITAMIN WITH MINERALS) TABS tablet Take 1 tablet by mouth daily.     omeprazole (PRILOSEC) 10 MG capsule Take 10 mg by mouth at bedtime.     polyethylene glycol (MIRALAX / GLYCOLAX) 17 g packet Take 17 g by mouth daily as needed for mild constipation.     potassium chloride SA (KLOR-CON M) 10 MEQ tablet Take 1 tablet (10 mEq total) by mouth daily. For 5 days then stop. 5 tablet 0   senna (SENOKOT) 8.6 MG tablet Take 1 tablet by mouth at bedtime.     sildenafil (VIAGRA) 100 MG tablet Take 100 mg by mouth daily as needed for erectile dysfunction.     telmisartan (MICARDIS) 40 MG tablet Take 1 tablet (40 mg total) by mouth daily. 30 tablet 1   traMADol (ULTRAM) 50 MG tablet Take 1 tablet (50 mg total) by mouth every 6 (six) hours as needed for moderate  pain. 30 tablet 0   VITAMIN B COMPLEX-C PO Take 1 tablet by mouth at bedtime.     No current facility-administered medications for this visit.    Physical Exam:  BP (!) 95/59 (BP Location: Left Arm, Patient Position: Sitting)   Pulse 75   Resp 20   Ht 5\' 8"  (1.727 m)   Wt 202 lb (91.6 kg)   SpO2 93% Comment: RA  BMI 30.71 kg/m   Gen: NAD Heart: RRR Lungs: CTA bilaterally Abd: + distention, hypoactive BS Ext: + pitting edema  Diagnostic Tests: Narrative & Impression  CLINICAL DATA:  Status post coronary artery bypass graft. Shortness of breath.   EXAM: CHEST - 2 VIEW   COMPARISON:  May 01, 2023.   FINDINGS: Stable cardiomediastinal silhouette. Stable right apical pleural thickening. Stable left basilar pleural thickening and scarring is noted. No definite pneumothorax is noted. Bony thorax is unremarkable.   IMPRESSION: Stable chronic findings as described above. No definite acute abnormality.     Electronically Signed   By: Lupita Raider M.D.   On: 05/18/2023 13:26     A/P:  S/P CABG performed 5/7-- now with increasing LE swelling and dyspnea Hypotension- will stop Micardis Dyspnea- w/o effusion on CXR, unilateral LE swelling, denies LE edema..  Constipation- will add Lactulose pRN  Plan: await BMET, in setting of hypotesion, continued Lasix use.. swelling could be third spacing... if creatinine is normal.. will give Lasix, and Zaroxolyn x 3 days  RTC in 1 week.. Also needs [facility] appointment with Cardiology  Lowella Dandy, PA-C [facility] [phone]

## 2023-05-19 ENCOUNTER — Ambulatory Visit: Payer: Medicare Other | Attending: Student | Admitting: Physician Assistant

## 2023-05-19 ENCOUNTER — Telehealth: Payer: Self-pay

## 2023-05-19 ENCOUNTER — Telehealth: Payer: Self-pay | Admitting: Physician Assistant

## 2023-05-19 ENCOUNTER — Encounter: Payer: Self-pay | Admitting: Physician Assistant

## 2023-05-19 VITALS — BP 130/76 | HR 51 | Ht 68.5 in | Wt 201.0 lb

## 2023-05-19 DIAGNOSIS — I10 Essential (primary) hypertension: Secondary | ICD-10-CM | POA: Diagnosis not present

## 2023-05-19 DIAGNOSIS — I5031 Acute diastolic (congestive) heart failure: Secondary | ICD-10-CM | POA: Insufficient documentation

## 2023-05-19 DIAGNOSIS — I2510 Atherosclerotic heart disease of native coronary artery without angina pectoris: Secondary | ICD-10-CM | POA: Insufficient documentation

## 2023-05-19 DIAGNOSIS — R0609 Other forms of dyspnea: Secondary | ICD-10-CM

## 2023-05-19 DIAGNOSIS — D62 Acute posthemorrhagic anemia: Secondary | ICD-10-CM | POA: Diagnosis not present

## 2023-05-19 LAB — CBC
Hematocrit: 31.2 % — ABNORMAL LOW (ref 37.5–51.0)
Hemoglobin: 9.9 g/dL — ABNORMAL LOW (ref 13.0–17.7)
MCH: 26.9 pg (ref 26.6–33.0)
MCHC: 31.7 g/dL (ref 31.5–35.7)
MCV: 85 fL (ref 79–97)
Platelets: 277 10*3/uL (ref 150–450)
RBC: 3.68 x10E6/uL — ABNORMAL LOW (ref 4.14–5.80)
RDW: 19 % — ABNORMAL HIGH (ref 11.6–15.4)
WBC: 7.1 10*3/uL (ref 3.4–10.8)

## 2023-05-19 NOTE — Telephone Encounter (Addendum)
Patient called by [name] PA-C. Spoke with patients wife. Patients  and patients wife had understanding of results----- Message from Cannon Kettle, PA-C sent at 05/19/2023  1:44 PM EDT ----- Improved hemoglobin.  Spoke with patient's wife over phone with bloodwork results.

## 2023-05-19 NOTE — Patient Instructions (Signed)
Medication Instructions:  Take Metolazone 30-60 minutes prior to taking Lasix. *If you need a refill on your cardiac medications before your next appointment, please call your pharmacy*   Lab Work: CBC: Today BMET: To Be done On Monday. If you have labs (blood work) drawn today and your tests are completely normal, you will receive your results only by: MyChart Message (if you have MyChart) OR A paper copy in the mail If you have any lab test that is abnormal or we need to change your treatment, we will call you to review the results.   Testing/Procedures: No Testing    Follow-Up: At [facility], you and your health needs are our priority.  As part of our continuing mission to provide you with exceptional heart care, we have created designated Provider Care Teams.  These Care Teams include your primary Cardiologist (physician) and Advanced Practice Providers (APPs -  Physician Assistants and Nurse Practitioners) who all work together to provide you with the care you need, when you need it.  We recommend signing up for the patient portal called "MyChart".  Sign up information is provided on this After Visit Summary.  MyChart is used to connect with patients for Virtual Visits (Telemedicine).  Patients are able to view lab/test results, encounter notes, upcoming appointments, etc.  Non-urgent messages can be sent to your provider as well.   To learn more about what you can do with MyChart, go to [email or web address].    Your next appointment:   4-6 week(s)  Provider:   [name], PA-C    Then, Rollene Rotunda, MD will plan to see you again in 3-4 month(s).

## 2023-05-19 NOTE — Telephone Encounter (Signed)
Stat CBC returned with improved hemoglobin.  Therefore, anemia likely not primary driver of his shortness of breath.  Safe to continue aspirin 325mg  daily.  Shortness of breath likely 2/2 fluid retention postop.  He is starting 3 day metolazone course tomorrow with lasix.  If no improvement in dyspnea by Monday, added D-dimer to be checked Monday with his standing BMET.    Keep CTS appointment next Friday.    [name] PA-C

## 2023-05-21 ENCOUNTER — Ambulatory Visit: Payer: Medicare Other | Admitting: Cardiology

## 2023-05-24 ENCOUNTER — Other Ambulatory Visit: Payer: Self-pay

## 2023-05-24 ENCOUNTER — Telehealth: Payer: Self-pay | Admitting: *Deleted

## 2023-05-24 DIAGNOSIS — R0609 Other forms of dyspnea: Secondary | ICD-10-CM

## 2023-05-24 NOTE — Telephone Encounter (Signed)
   Name: Daniel Colon  DOB: 10/12/1946  MRN: [id number]  Primary Cardiologist: Rollene Rotunda, MD  Patient is scheduled for an office visit with [name], PA-C on 06/23/2023. I have updated visit notes to reflect need for preoperative risk assessment at that visit.  Pre-op covering staff:  - Please contact requesting surgeon's office via preferred method (i.e, phone, fax) to inform them of previously scheduled appointment prior to surgery.  Carlos Levering, NP  05/24/2023, 4:45 PM

## 2023-05-24 NOTE — Telephone Encounter (Signed)
   Pre-operative Risk Assessment    Patient Name: Daniel Colon  DOB: 1946-05-18 MRN: [id number]      Request for Surgical Clearance    Procedure:   RIGHT TOTAL HIP ARTHROPLASTY  Date of Surgery:  Clearance 08/08/23                                 Surgeon:  DR. Ollen Gross Surgeon's Group or Practice Name:  [facility] Phone number:  [phone] Fax number:  [phone]   Type of Clearance Requested:   - Medical ; ASA    Type of Anesthesia:   CHOICE   Additional requests/questions:    Elpidio Anis   05/24/2023, 12:54 PM

## 2023-05-24 NOTE — Telephone Encounter (Signed)
Left voicemail on surgery schedulers Tresa Endo) vm making her aware of pts upcoming appointment

## 2023-05-25 ENCOUNTER — Other Ambulatory Visit: Payer: Self-pay

## 2023-05-25 ENCOUNTER — Emergency Department (HOSPITAL_COMMUNITY): Payer: Medicare Other

## 2023-05-25 ENCOUNTER — Emergency Department (HOSPITAL_COMMUNITY)
Admission: EM | Admit: 2023-05-25 | Discharge: 2023-05-25 | Disposition: A | Discharge status: Home / Self Care | Payer: Medicare Other | Attending: Emergency Medicine | Admitting: Emergency Medicine

## 2023-05-25 ENCOUNTER — Encounter (HOSPITAL_COMMUNITY): Payer: Self-pay

## 2023-05-25 DIAGNOSIS — I10 Essential (primary) hypertension: Secondary | ICD-10-CM | POA: Insufficient documentation

## 2023-05-25 DIAGNOSIS — J449 Chronic obstructive pulmonary disease, unspecified: Secondary | ICD-10-CM | POA: Insufficient documentation

## 2023-05-25 DIAGNOSIS — I2510 Atherosclerotic heart disease of native coronary artery without angina pectoris: Secondary | ICD-10-CM | POA: Diagnosis not present

## 2023-05-25 DIAGNOSIS — R0602 Shortness of breath: Secondary | ICD-10-CM | POA: Diagnosis not present

## 2023-05-25 DIAGNOSIS — I700 Atherosclerosis of aorta: Secondary | ICD-10-CM | POA: Diagnosis not present

## 2023-05-25 LAB — BASIC METABOLIC PANEL
Anion gap: 14 (ref 5–15)
BUN: 34 mg/dL — ABNORMAL HIGH (ref 8–23)
CO2: 24 mmol/L (ref 22–32)
Calcium: 9.1 mg/dL (ref 8.9–10.3)
Chloride: 104 mmol/L (ref 98–111)
Creatinine, Ser: 0.99 mg/dL (ref 0.61–1.24)
GFR, Estimated: >60 mL/min (ref >60)
Glucose, Bld: 108 mg/dL — ABNORMAL HIGH (ref 70–99)
Potassium: 3.9 mmol/L (ref 3.5–5.1)
Sodium: 142 mmol/L (ref 135–145)

## 2023-05-25 LAB — HEPATIC FUNCTION PANEL
ALT: 61 U/L — ABNORMAL HIGH (ref 0–44)
AST: 30 U/L (ref 15–41)
Albumin: 3.6 g/dL (ref 3.5–5.0)
Alkaline Phosphatase: 126 U/L (ref 38–126)
Bilirubin, Direct: 0.2 mg/dL (ref 0.0–0.2)
Indirect Bilirubin: 0.8 mg/dL (ref 0.3–0.9)
Total Bilirubin: 1 mg/dL (ref 0.3–1.2)
Total Protein: 5.9 g/dL — ABNORMAL LOW (ref 6.5–8.1)

## 2023-05-25 LAB — I-STAT CHEM 8, ED
BUN: 32 mg/dL — ABNORMAL HIGH (ref 8–23)
Calcium, Ion: 1.12 mmol/L — ABNORMAL LOW (ref 1.15–1.40)
Chloride: 104 mmol/L (ref 98–111)
Creatinine, Ser: 0.9 mg/dL (ref 0.61–1.24)
Glucose, Bld: 105 mg/dL — ABNORMAL HIGH (ref 70–99)
HCT: 36 % — ABNORMAL LOW (ref 39.0–52.0)
Hemoglobin: 12.2 g/dL — ABNORMAL LOW (ref 13.0–17.0)
Potassium: 3.9 mmol/L (ref 3.5–5.1)
Sodium: 140 mmol/L (ref 135–145)
TCO2: 28 mmol/L (ref 22–32)

## 2023-05-25 LAB — CBC
HCT: 38.6 % — ABNORMAL LOW (ref 39.0–52.0)
Hemoglobin: 11.3 g/dL — ABNORMAL LOW (ref 13.0–17.0)
MCH: 27.1 pg (ref 26.0–34.0)
MCHC: 29.3 g/dL — ABNORMAL LOW (ref 30.0–36.0)
MCV: 92.6 fL (ref 80.0–100.0)
Platelets: 255 10*3/uL (ref 150–400)
RBC: 4.17 MIL/uL — ABNORMAL LOW (ref 4.22–5.81)
RDW: 18.2 % — ABNORMAL HIGH (ref 11.5–15.5)
WBC: 8.3 10*3/uL (ref 4.0–10.5)
nRBC: 0 % (ref 0.0–0.2)

## 2023-05-25 LAB — TROPONIN I (HIGH SENSITIVITY)
Troponin I (High Sensitivity): 17 ng/L (ref <18)
Troponin I (High Sensitivity): 19 ng/L — ABNORMAL HIGH (ref <18)

## 2023-05-25 LAB — MAGNESIUM: Magnesium: 2.2 mg/dL (ref 1.7–2.4)

## 2023-05-25 LAB — D-DIMER, QUANTITATIVE: D-DIMER: 2.12 mg/L FEU — ABNORMAL HIGH (ref 0.00–0.49)

## 2023-05-25 MED ORDER — IOHEXOL 350 MG/ML SOLN
75.0000 mL | Freq: Once | INTRAVENOUS | Status: AC | PRN
  Administered 2023-05-25: 75 mL via INTRAVENOUS

## 2023-05-25 MED ORDER — FUROSEMIDE 10 MG/ML IJ SOLN
40.0000 mg | Freq: Once | INTRAMUSCULAR | Status: AC
  Administered 2023-05-25: 40 mg via INTRAVENOUS
  Filled 2023-05-25: qty 4

## 2023-05-25 NOTE — ED Provider Triage Note (Cosign Needed Addendum)
Emergency Medicine Provider Triage Evaluation Note  Daniel Colon , a 77 y.o. male  was evaluated in triage.  Pt complains of shortness of [place] for the past week particularly worse over the past 2 to 3 days with associated peripheral edema.  States for period of 3 days he did higher dose of Lasix as recommended by his cardiologist.  It did appear to help his symptoms however since then he has been taking his regular dose and his symptoms has gotten worse again.  Had recent D-dimer drawn which was elevated.  Recommended to come to the ED for PE study.  Review of Systems  Positive: As above Negative: As above  Physical Exam  BP (!) 175/89 (BP Location: Right Arm)   Pulse 84   Temp 98.5 F (36.9 C)   Resp (!) 94   Ht 5' 8.5" (1.74 m)   Wt 91.2 kg   SpO2 94%   BMI 30.13 kg/m  Gen:   Awake, no distress   Resp:  Normal effort  MSK:   Moves extremities without difficulty  Other:  Conversational dyspnea on exam, peripheral edema  Medical Decision Making  Medically screening exam initiated at 3:13 PM.  Appropriate orders placed.  [name] was informed that the remainder of the evaluation will be completed by another provider, this initial triage assessment does not replace that evaluation, and the importance of remaining in the ED until their evaluation is complete.    Marita Kansas, PA-C 05/25/23 1514    Marita Kansas, PA-C 05/25/23 1515

## 2023-05-25 NOTE — ED Provider Notes (Signed)
[place] EMERGENCY DEPARTMENT AT [facility] Provider Note   CSN: [id number] Arrival date & time: 05/25/23  1457     History  Chief Complaint  Patient presents with   Shortness of Breath    Daniel Colon is a 77 y.o. male.   Shortness of Breath Patient presents for shortness of breath.  Medical history includes CAD, GERD, HLD, HTN, COPD.  Prior echocardiogram showed LVH, LVEF 55 to 60%, small patent foramina ovale.  He underwent bypass surgery 4 weeks ago.  Since his surgery, he has had shortness of breath, worsened with exertion.  He also had swelling in bilateral lower extremities.  PCP suspected shortness of breath secondary to fluid retention.  He was given a 3-day course of metolazone with Lasix.  He has since been on 40 mg of Lasix daily.  Prior to last week, he was not on any Lasix.  Following the initiation of diuretics, he has had resolution of his lower extremity swelling.  He has had continued shortness of breath that has worsened over the past couple days.  He denies orthopnea.   He had recent outpatient lab work which showed elevated D-dimer.  He was sent to the ED for PE study.  He denies any known history of VTE.     Home Medications Prior to Admission medications   Medication Sig Start Date End Date Taking? Authorizing Provider  acetaminophen (TYLENOL) 650 MG CR tablet Take 650 mg by mouth every 8 (eight) hours as needed for pain.    [provider]  albuterol (VENTOLIN HFA) 108 (90 Base) MCG/ACT inhaler Inhale 2 puffs into the lungs every 6 (six) hours as needed for wheezing or shortness of breath. 11/19/20   [provider]  aspirin EC 325 MG tablet Take 1 tablet (325 mg total) by mouth daily. 05/02/23   Ardelle Balls, PA-C  atorvastatin (LIPITOR) 40 MG tablet Take 1 tablet (40 mg total) by mouth at bedtime. 05/05/23   Rollene Rotunda, MD  bisacodyl (DULCOLAX) 5 MG EC tablet Take 5 mg by mouth daily as needed for moderate  constipation.    [provider]  BREZTRI AEROSPHERE 160-9-4.8 MCG/ACT AERO Inhale 2 puffs into the lungs in the morning and at bedtime. 06/25/20   [provider]  Coenzyme Q10 (CO Q-10) 200 MG CAPS Take 200 mg by mouth at bedtime.    [provider]  docusate sodium (COLACE) 100 MG capsule Take 100 mg by mouth at bedtime.    [provider]  ezetimibe (ZETIA) 10 MG tablet Take 10 mg by mouth at bedtime.    [provider]  fish oil-omega-3 fatty acids 1000 MG capsule Take 1,000 mg by mouth at bedtime.    [provider]  fluorouracil (EFUDEX) 5 % cream Apply 1 Application topically daily as needed (rash). 11/04/22   [provider]  furosemide (LASIX) 40 MG tablet Take 1 tablet (40 mg total) by mouth daily. For 5 days then stop. Patient not taking: Reported on 05/19/2023 05/07/23   Corliss Skains, MD  furosemide (LASIX) 40 MG tablet Take 1 tablet (40 mg total) by mouth daily. 05/18/23   Barrett, Erin R, PA-C  GNP IRON 200 (65 Fe) MG TABS Take 200 mg by mouth at bedtime. 04/27/22   [provider]  Lactulose 20 GM/30ML SOLN Take 30 mLs (20 g total) by mouth daily as needed. 05/18/23   Barrett, Erin R, PA-C  metolazone (ZAROXOLYN) 5 MG tablet Take  1 tablet (5 mg total) by mouth daily. 05/18/23   Barrett, Erin R, PA-C  metoprolol tartrate (LOPRESSOR) 25 MG tablet Take 1 tablet (25 mg total) by mouth 2 (two) times daily. 05/12/23   Rollene Rotunda, MD  Multiple Vitamin (MULTIVITAMIN WITH MINERALS) TABS tablet Take 1 tablet by mouth daily.    [provider]  omeprazole (PRILOSEC) 10 MG capsule Take 10 mg by mouth at bedtime.    [provider]  polyethylene glycol (MIRALAX / GLYCOLAX) 17 g packet Take 17 g by mouth daily as needed for mild constipation.    [provider]  potassium chloride 20 MEQ TBCR Take 1 tablet (20 mEq total) by mouth daily. 05/18/23   Barrett, Erin R, PA-C  potassium chloride SA  (KLOR-CON M) 10 MEQ tablet Take 1 tablet (10 mEq total) by mouth daily. For 5 days then stop. Patient not taking: Reported on 05/19/2023 05/02/23   Ardelle Balls, PA-C  senna (SENOKOT) 8.6 MG tablet Take 1 tablet by mouth at bedtime.    [provider]  sildenafil (VIAGRA) 100 MG tablet Take 100 mg by mouth daily as needed for erectile dysfunction. 12/23/21   [provider]  traMADol (ULTRAM) 50 MG tablet Take 1 tablet (50 mg total) by mouth every 6 (six) hours as needed for moderate pain. 05/02/23   Ardelle Balls, PA-C  VITAMIN B COMPLEX-C PO Take 1 tablet by mouth at bedtime.    [provider]      Allergies    Patient has no known allergies.    Review of Systems   Review of Systems  Constitutional:  Positive for fatigue.  Respiratory:  Positive for shortness of breath.   All other systems reviewed and are negative.   Physical Exam Updated Vital Signs BP 130/65   Pulse 74   Temp 97.8 F (36.6 C) (Oral)   Resp (!) 21   Ht 5' 8.5" (1.74 m)   Wt 91.2 kg   SpO2 96%   BMI 30.13 kg/m  Physical Exam Vitals and nursing note reviewed.  Constitutional:      General: He is not in acute distress.    Appearance: He is well-developed. He is not ill-appearing, toxic-appearing or diaphoretic.  HENT:     Head: Normocephalic and atraumatic.     Mouth/Throat:     Mouth: Mucous membranes are moist.  Eyes:     Conjunctiva/sclera: Conjunctivae normal.  Cardiovascular:     Rate and Rhythm: Normal rate and regular rhythm.     Heart sounds: No murmur heard. Pulmonary:     Effort: Pulmonary effort is normal. No accessory muscle usage or respiratory distress.     Breath sounds: Wheezing present. No rhonchi or rales.  Abdominal:     Palpations: Abdomen is soft.     Tenderness: There is no abdominal tenderness.  Musculoskeletal:        General: No swelling. Normal range of motion.     Cervical back: Normal range of motion and neck supple.     Right  lower leg: No edema.     Left lower leg: No edema.  Skin:    General: Skin is warm and dry.     Coloration: Skin is not cyanotic or pale.  Neurological:     General: No focal deficit present.     Mental Status: He is alert and oriented to person, place, and time.  Psychiatric:        Mood and Affect: Mood  normal.        Behavior: Behavior normal.     ED Results / Procedures / Treatments   Labs (all labs ordered are listed, but only abnormal results are displayed) Labs Reviewed  BASIC METABOLIC PANEL - Abnormal; Notable for the following components:      Result Value   Glucose, Bld 108 (*)    BUN 34 (*)    All other components within normal limits  CBC - Abnormal; Notable for the following components:   RBC 4.17 (*)    Hemoglobin 11.3 (*)    HCT 38.6 (*)    MCHC 29.3 (*)    RDW 18.2 (*)    All other components within normal limits  HEPATIC FUNCTION PANEL - Abnormal; Notable for the following components:   Total Protein 5.9 (*)    ALT 61 (*)    All other components within normal limits  I-STAT CHEM 8, ED - Abnormal; Notable for the following components:   BUN 32 (*)    Glucose, Bld 105 (*)    Calcium, Ion 1.12 (*)    Hemoglobin 12.2 (*)    HCT 36.0 (*)    All other components within normal limits  TROPONIN I (HIGH SENSITIVITY) - Abnormal; Notable for the following components:   Troponin I (High Sensitivity) 19 (*)    All other components within normal limits  MAGNESIUM  BRAIN NATRIURETIC PEPTIDE  TROPONIN I (HIGH SENSITIVITY)    EKG None  Radiology CT Angio Chest PE W and/or Wo Contrast  Result Date: 05/25/2023 CLINICAL DATA:  Pulmonary embolus suspected with high probability. EXAM: CT ANGIOGRAPHY CHEST WITH CONTRAST TECHNIQUE: Multidetector CT imaging of the chest was performed using the standard protocol during bolus administration of intravenous contrast. Multiplanar CT image reconstructions and MIPs were obtained to evaluate the vascular anatomy. RADIATION DOSE  REDUCTION: This exam was performed according to the departmental dose-optimization program which includes automated exposure control, adjustment of the mA and/or kV according to patient size and/or use of iterative reconstruction technique. CONTRAST:  75mL OMNIPAQUE IOHEXOL 350 MG/ML SOLN COMPARISON:  Chest radiograph 05/25/2023.  CT 09/04/2022 FINDINGS: Cardiovascular: Technically adequate study with good opacification of the central and segmental pulmonary arteries. No focal filling defects. No evidence of significant pulmonary embolus. Cardiac enlargement with small pericardial effusion. Calcification in the mitral valve annulus, aorta, and coronary arteries. Postoperative changes consistent with coronary bypass. Normal caliber thoracic aorta. Mediastinum/Nodes: Esophagus is decompressed. Mediastinal lymphadenopathy with largest right paratracheal lymph nodes measuring 1.4 cm short axis dimension. Left aortopulmonic window lymph node measures 1.3 cm short axis dimension. Lymph nodes are enlarged since previous study. Thyroid gland is unremarkable. Lungs/Pleura: Small bilateral pleural effusions greater on the right. Right pleural effusion extends into the major fissure. Possible loculated component in the anterior left pleural effusion. Atelectasis in the lung bases. Interstitial septal thickening bilaterally likely indicating edema. Linear scarring in the lung bases is probably chronic. Sutures are suggested in the right upper lung suggesting postoperative change. Right apical pleural thickening is unchanged and possibly postoperative. Upper Abdomen: Postoperative changes in the stomach with apparent gastrojejunal anastomosis. 2.9 cm diameter right adrenal gland nodule, 24 Hounsfield units, unchanged since previous study. This has been present on prior studies dating back to 05/27/2020 suggesting benign etiology. No imaging follow-up is indicated. Musculoskeletal: Degenerative changes in the spine. Sternotomy  wires. Old right rib deformity, possibly thoracotomy. Review of the MIP images confirms the above findings. IMPRESSION: 1. No evidence of significant pulmonary embolus. 2. Cardiac  enlargement with bilateral pleural effusions and interstitial edema in the lungs. Small pericardial effusion. 3. Chronic changes including scarring in the lung bases, right apical pleural thickening, and probable postoperative changes in the right upper lung. 4. Mediastinal lymphadenopathy is enlarging since prior study. Nonspecific etiology. In the setting of prior lung cancer, metastatic disease is not excluded. These could be reactive as well. 5. Aortic atherosclerosis. Electronically Signed   By: Burman Nieves M.D.   On: 05/25/2023 19:34   DG Chest 2 View  Result Date: 05/25/2023 CLINICAL DATA:  Shortness of breath EXAM: CHEST - 2 VIEW COMPARISON:  Radiographs 05/18/2023 FINDINGS: Sternotomy and CABG. Stable cardiomediastinal silhouette. Aortic atherosclerotic calcification. Postoperative change of right upper lobectomy. Unchanged pleural thickening at the right apex. Right basilar scarring. Predominantly interstitial opacities in the left mid and lower lung are similar to prior. Unchanged small left pleural effusion or pleural thickening. No pneumothorax. No acute fracture. IMPRESSION: Stable chronic findings as described. Electronically Signed   By: Minerva Fester M.D.   On: 05/25/2023 15:58    Procedures Procedures    Medications Ordered in ED Medications  iohexol (OMNIPAQUE) 350 MG/ML injection 75 mL (75 mLs Intravenous Contrast Given 05/25/23 1912)  furosemide (LASIX) injection 40 mg (40 mg Intravenous Given 05/25/23 2008)    ED Course/ Medical Decision Making/ A&P                             Medical Decision Making Amount and/or Complexity of Data Reviewed Labs: ordered. Radiology: ordered.  Risk Prescription drug management.   This patient presents to the ED for concern of shortness of breath, this  involves an extensive number of treatment options, and is a complaint that carries with it a high risk of complications and morbidity.  The differential diagnosis includes pulmonary edema, PE, pneumonia, COPD exacerbation, anemia, deconditioning   Co morbidities that complicate the patient evaluation  CAD, GERD, HLD, HTN, COPD   Additional history obtained:  Additional history obtained from patient's wife External records from outside source obtained and reviewed including EMR   Lab Tests:  I Ordered, and personally interpreted labs.  The pertinent results include: Baseline anemia, no leukocytosis, normal troponin, normal electrolytes   Imaging Studies ordered:  I ordered imaging studies including CTA chest I independently visualized and interpreted imaging which showed no evidence of PE.  There is cardiac enlargement with interstitial edema and lungs and bilateral pleural effusions.  A small pericardial effusion is noted. I agree with the radiologist interpretation   Cardiac Monitoring: / EKG:  The patient was maintained on a cardiac monitor.  I personally viewed and interpreted the cardiac monitored which showed an underlying rhythm of: Sinus rhythm   Problem List / ED Course / Critical interventions / Medication management  Patient presenting for shortness of breath over the past month, worsened over the past week.  He did have some improvement in his shortness of breath and leg swelling following initiation of diuretic medications.  Symptoms have worsened over the past couple days, despite daily Lasix.  He had a recent elevated D-dimer.  Per chart review, D-dimer was 2.12 yesterday.  He has no known history of VTE.  He is not on anticoagulation.  On exam, patient is well-appearing.  SpO2 is normal on room air.  He denies any current dyspnea at rest.  He has slight expiratory wheezing on lung auscultation.  He declined nebulized breathing treatment.  Lab work and PE  study were  ordered.  Lab work is unremarkable.  PE study did not show evidence of PE.  It did show pulmonary edema and pleural effusions.  Patient symptoms likely secondary to volume overload.  Dose of IV Lasix was given in the ED.  Patient was advised to follow-up with cardiology and to return to the ED for any worsening of symptoms.  He was discharged in stable condition.. I ordered medication including Lasix for diuresis Reevaluation of the patient after these medicines showed that the patient stayed the same I have reviewed the patients home medicines and have made adjustments as needed   Social Determinants of Health:  Has access to outpatient care        Final Clinical Impression(s) / ED Diagnoses Final diagnoses:  SOB (shortness of breath)    Rx / DC Orders ED Discharge Orders          Ordered    Ambulatory referral to Cardiology       Comments: If you have not heard from the Cardiology office within the next 72 hours please call [phone].   05/25/23 2010              Gloris Manchester, MD 05/25/23 2012

## 2023-05-25 NOTE — Discharge Instructions (Addendum)
Continue your daily Lasix.  Follow-up with cardiology as soon as possible.  Return to the emergency department for any new or worsening symptoms of concern.

## 2023-05-25 NOTE — ED Notes (Signed)
Returned from CT.

## 2023-05-25 NOTE — ED Triage Notes (Addendum)
Pt c/o SOBx2-3d. Pt had D-dimer done at PCP and it was elevated, so pt told to come here to get chest CT. Pt c/o dyspnea

## 2023-05-25 NOTE — ED Notes (Signed)
Patient transported to CT 

## 2023-05-26 ENCOUNTER — Encounter: Payer: Self-pay | Admitting: Physician Assistant

## 2023-05-26 NOTE — Progress Notes (Signed)
Called patient to follow-up on ED visit yesterday.  I referred him to the ER after patient still felt significant shortness of breath 1 month post CABG despite diuresis & had positive d-dimer.    CT neg for PE.  Showed -  Small bilateral pleural effusions greater on the right. Right pleural effusion extends into the major fissure. Possible loculated component in the anterior left pleural effusion. Atelectasis in the lung bases. Interstitial septal thickening bilaterally likely indicating edema.   Pt was given IV lasix 40mg  x 1 in ER.  Wife states improvement in symptoms.    Plan: Recommended he take lasix 40mg  2 tablets daily x 3 days & then has follow-up with Dr. Cliffton Asters this Friday.  I will see him again 7/3.  [name] PA-C

## 2023-05-28 ENCOUNTER — Ambulatory Visit (INDEPENDENT_AMBULATORY_CARE_PROVIDER_SITE_OTHER): Payer: Self-pay | Admitting: Thoracic Surgery (Cardiothoracic Vascular Surgery)

## 2023-05-28 ENCOUNTER — Encounter: Payer: Self-pay | Admitting: Thoracic Surgery (Cardiothoracic Vascular Surgery)

## 2023-05-28 ENCOUNTER — Telehealth: Payer: Self-pay | Admitting: *Deleted

## 2023-05-28 VITALS — BP 150/83 | HR 76 | Resp 20 | Ht 68.5 in | Wt 194.6 lb

## 2023-05-28 DIAGNOSIS — Z951 Presence of aortocoronary bypass graft: Secondary | ICD-10-CM

## 2023-05-28 NOTE — Progress Notes (Signed)
      [address]       [place] [address]             [phone]        [facility] Medical Record #[id number] Date of Birth: 10/04/1946  Referring: Meriam Sprague, MD Primary Care: Garlan Fillers, MD Primary Cardiologist:James Hochrein, MD  Reason for visit:   follow-up  History of Present Illness:     77 year old male presents in follow-up.  He recently was seen in the emergency department for worsening shortness of breath.  He has been diuresed with improvement.  He still has some degree of shortness of breath.  Physical Exam: BP (!) 150/83 (BP Location: Right Arm, Patient Position: Sitting, Cuff Size: Normal)   Pulse 76   Resp 20   Ht 5' 8.5" (1.74 m)   Wt 194 lb 9.6 oz (88.3 kg)   SpO2 93% Comment: RA  BMI 29.16 kg/m   Alert NAD Incision clean.  Sternum stable Abdomen, ND Trace peripheral edema   Diagnostic Studies & Laboratory data: CT chest was reviewed.  There is small bilateral effusions.    Assessment / Plan:   77 year old male status post CABG.  He also has a history of a right upper lobectomy.  Overall he is doing better.  The CT scan looks good overall.  He will be cleared for cardiac rehab in 2 weeks.  He will follow-up as needed.   Corliss Skains 05/28/2023 3:54 PM

## 2023-05-28 NOTE — Telephone Encounter (Signed)
Transition Care Management Follow-up Telephone Call Date of discharge and from where: [place] 05/25/2023 How have you been since you were released from the hospital? Much better  Any questions or concerns? No  Items Reviewed: Did the pt receive and understand the discharge instructions provided? Yes  Medications obtained and verified? Yes  Other? No  Any new allergies since your discharge? No  Dietary orders reviewed? No Do you have support at home? No     Follow up appointments reviewed:   [facility] f/u appt confirmed? Yes  Scheduled to Follow up with surgeon today .  Are transportation arrangements needed? No  If their condition worsens, is the pt aware to call PCP or go to the Emergency Dept.? Yes Was the patient provided with contact information for the PCP's office or ED? Yes Was to pt encouraged to call back with questions or concerns? Yes

## 2023-06-05 NOTE — Progress Notes (Unsigned)
Cardiology Office Note:   Date:  06/07/2023  ID:  Daniel Colon, DOB 27-Aug-1946, MRN [id number] PCP: Garlan Fillers, MD  [facility] Providers Cardiologist:  Rollene Rotunda, MD    History of Present Illness:   Daniel Colon is a 77 y.o. male with a hx of CAD, s/p stent to RCA in 1996 in [place] and non-small cell lung cancer status post status post robotic assisted right upper lobectomy by Dr. Tyrone Sage  in 2021 & chemotherapy, 42-pack-year cigarette smoker having quit in 2012, hypertension, hyperlipidemia.  In March 2024 for preop clearance prior to possible hip surgery.  Patient noted some decreased exercise tolerance, muscle weakness and some dyspnea/chest discomfort on exertion.  He underwent cardiac PET which showed high risk findings including decrease in EF with stress, TID, large perfusion defect.  He then underwent left heart catheterization on Apr 22, 2023 showing three-vessel disease was admitted for CABG workup.  He underwent CABG x 4 utilizing LIMA to LAD, SVG to OM, SVG to Diagonal, SVG to PDA.  With postop blood loss, he was transfused 2 units and later another 2 units.  He remained in normal sinus rhythm.  Dr. Antoine Poche recommended waiting 2 months until hip surgery.  Carvedilol and Micardis-HCTZ were discontinued.  Discharged on aspirin 325 mg daily.  Discharged on Micardis 40mg  daily and metoprolol tartrate 25 mg twice daily.  He had weight gain and edema post op and he was given Lasix and Zaroxolyn.  BP was low so Micardis was stopped.  ***  He was in the emergency room with shortness of breath since we last saw him he got some IV diuresis.  I did review these records.  CT showed no pulmonary embolism.  There was a comment on some anastomotic postoperative changes in his gastro jejunal junction.  He has had a lot of abdominal complaints.  Has had chronic constipation but this has been more problematic recently.  He feels like a fullness in the food as a transit.  He is having  continued shortness of breath.  Some days are worse than others.  Feels short of breath walking across his house sometimes.  He sleeps with his head slightly elevated but this is not new PND or orthopnea.  He had continued chronic lower extremity swelling although his weights have been relatively stable.  He is wearing compression stockings.  He is reduced his eating.  He has not using as much salt or drinking excess fluid.  He has not had any new palpitations, presyncope or syncope.  ROS: As stated in the HPI and negative for all other systems.  Studies Reviewed:    EKG:  NA   Risk Assessment/Calculations:              Physical Exam:   VS:  BP 134/72 (BP Location: Left Arm, Patient Position: Sitting, Cuff Size: Normal)   Pulse 69   Ht 5\' 8"  (1.727 m)   Wt 200 lb 3.2 oz (90.8 kg)   SpO2 95%   BMI 30.44 kg/m    Wt Readings from Last 3 Encounters:  06/07/23 200 lb 3.2 oz (90.8 kg)  05/28/23 194 lb 9.6 oz (88.3 kg)  05/25/23 201 lb 1 oz (91.2 kg)     GEN: Well nourished, well developed in no acute distress NECK: No JVD; No carotid bruits CARDIAC: RRR, no murmurs, rubs, gallops RESPIRATORY:  Clear to auscultation without rales, wheezing or rhonchi  ABDOMEN: Soft, non-tender, non-distended EXTREMITIES: Moderate bilateral leg edema;  No deformity   ASSESSMENT AND PLAN:    Acute diastolic heart failure: He is still retaining quite a bit of fluid.  I am going to stop his Lasix and start torsemide 40 mg daily.  We will check a basic metabolic profile today and again in about 10 days.  He is to keep his feet elevated and compression stockings on.  Postop blood loss: His most recent hemoglobin was up above 11.  I am going to switch him to 81 mg of aspirin.  Coronary artery disease status post CABG: Unfortunately he is having some slow progress.  Management as above and below.   Hypertension, well-controlled: His blood pressure was running low and Micardis was stopped previously.  I am  going to keep him off of this and continue other meds as listed.  Hyperlipidemia with goal LDL less than 55: Continue current therapy.     Chronic constipation: I am going to set him up to see his gastroenterologist as this has become even more of a problem acutely.  Preop: He has orthopedic surgery planned for August but we are going to see how he recovers prior to going through with this.    Cardiac Rehabilitation Eligibility Assessment  The patient is NOT ready to start cardiac rehabilitation due to: Other (SOB)     Follow up with me or [name] PAc in 2 weeks.   Signed, Rollene Rotunda, MD

## 2023-06-07 ENCOUNTER — Encounter: Payer: Self-pay | Admitting: Cardiology

## 2023-06-07 ENCOUNTER — Ambulatory Visit: Payer: Medicare Other | Attending: Cardiology | Admitting: Cardiology

## 2023-06-07 VITALS — BP 134/72 | HR 69 | Ht 68.0 in | Wt 200.2 lb

## 2023-06-07 DIAGNOSIS — I10 Essential (primary) hypertension: Secondary | ICD-10-CM | POA: Insufficient documentation

## 2023-06-07 DIAGNOSIS — I5031 Acute diastolic (congestive) heart failure: Secondary | ICD-10-CM | POA: Diagnosis not present

## 2023-06-07 DIAGNOSIS — Z86010 Personal history of colonic polyps: Secondary | ICD-10-CM | POA: Diagnosis not present

## 2023-06-07 DIAGNOSIS — I2510 Atherosclerotic heart disease of native coronary artery without angina pectoris: Secondary | ICD-10-CM | POA: Diagnosis not present

## 2023-06-07 DIAGNOSIS — E785 Hyperlipidemia, unspecified: Secondary | ICD-10-CM | POA: Insufficient documentation

## 2023-06-07 MED ORDER — ASPIRIN 81 MG PO TBEC
81.0000 mg | DELAYED_RELEASE_TABLET | Freq: Every day | ORAL | Status: DC
Start: 2023-06-07 — End: 2023-08-10

## 2023-06-07 MED ORDER — TORSEMIDE 20 MG PO TABS
40.0000 mg | ORAL_TABLET | Freq: Every day | ORAL | 3 refills | Status: DC
Start: 2023-06-07 — End: 2023-06-07

## 2023-06-07 MED ORDER — TORSEMIDE 20 MG PO TABS
40.0000 mg | ORAL_TABLET | Freq: Every day | ORAL | 3 refills | Status: DC
Start: 2023-06-07 — End: 2024-06-30

## 2023-06-07 NOTE — Patient Instructions (Signed)
Medication Instructions:   STOP FUROSEMIDE   START TORSEMIDE 40 MG ONCE DAILY= 2 OF THE 20 MG TABLETS ONCE DAILY  REDUCE ASPIRIN TO 81 MG ONCE DAILY  *If you need a refill on your cardiac medications before your next appointment, please call your pharmacy*   Lab Work:  Your physician recommends that you return for lab work in: 10 DAYS=06/17/23  If you have labs (blood work) drawn today and your tests are completely normal, you will receive your results only by: MyChart Message (if you have MyChart) OR A paper copy in the mail If you have any lab test that is abnormal or we need to change your treatment, we will call you to review the results.   Follow-Up: At [facility], you and your health needs are our priority.  As part of our continuing mission to provide you with exceptional heart care, we have created designated Provider Care Teams.  These Care Teams include your primary Cardiologist (physician) and Advanced Practice Providers (APPs -  Physician Assistants and Nurse Practitioners) who all work together to provide you with the care you need, when you need it.  We recommend signing up for the patient portal called "MyChart".  Sign up information is provided on this After Visit Summary.  MyChart is used to connect with patients for Virtual Visits (Telemedicine).  Patients are able to view lab/test results, encounter notes, upcoming appointments, etc.  Non-urgent messages can be sent to your provider as well.   To learn more about what you can do with MyChart, go to [email or web address].    Your next appointment:   2 week(s)  Provider:   Rollene Rotunda, MD  OR [name] PA

## 2023-06-08 ENCOUNTER — Encounter: Payer: Self-pay | Admitting: Cardiology

## 2023-06-08 LAB — BASIC METABOLIC PANEL
BUN/Creatinine Ratio: 23 (ref 10–24)
BUN: 21 mg/dL (ref 8–27)
CO2: 23 mmol/L (ref 20–29)
Calcium: 8.7 mg/dL (ref 8.6–10.2)
Chloride: 106 mmol/L (ref 96–106)
Creatinine, Ser: 0.92 mg/dL (ref 0.76–1.27)
Glucose: 83 mg/dL (ref 70–99)
Potassium: 4.5 mmol/L (ref 3.5–5.2)
Sodium: 142 mmol/L (ref 134–144)
eGFR: 87 mL/min/{1.73_m2} (ref >59)

## 2023-06-10 ENCOUNTER — Ambulatory Visit: Payer: Medicare Other

## 2023-06-15 ENCOUNTER — Ambulatory Visit (INDEPENDENT_AMBULATORY_CARE_PROVIDER_SITE_OTHER): Payer: Medicare Other | Admitting: Gastroenterology

## 2023-06-15 ENCOUNTER — Encounter: Payer: Self-pay | Admitting: Gastroenterology

## 2023-06-15 VITALS — BP 132/80 | HR 70 | Ht 68.0 in | Wt 181.6 lb

## 2023-06-15 DIAGNOSIS — R140 Abdominal distension (gaseous): Secondary | ICD-10-CM | POA: Diagnosis not present

## 2023-06-15 DIAGNOSIS — K5900 Constipation, unspecified: Secondary | ICD-10-CM | POA: Insufficient documentation

## 2023-06-15 DIAGNOSIS — R109 Unspecified abdominal pain: Secondary | ICD-10-CM | POA: Diagnosis not present

## 2023-06-15 NOTE — Patient Instructions (Signed)
You have been scheduled for a CT scan of the abdomen and pelvis at [facility], 1st floor Radiology. You are scheduled on Friday 06/25/23 at 3 pm. Please arrive at 1 pm (2 hours prior) to be able to drink the oral contrast.  Please follow the written instructions below on the day of your exam:   1) Do not eat anything after 11 am (4 hours prior to your test)   You may take any medications as prescribed with a small amount of water, if necessary. If you take any of the following medications: METFORMIN, GLUCOPHAGE, GLUCOVANCE, AVANDAMET, RIOMET, FORTAMET, ACTOPLUS MET, JANUMET, GLUMETZA or METAGLIP, you MAY be asked to HOLD this medication 48 hours AFTER the exam.   The purpose of you drinking the oral contrast is to aid in the visualization of your intestinal tract. The contrast solution may cause some diarrhea. Depending on your individual set of symptoms, you may also receive an intravenous injection of x-ray contrast/dye. Plan on being at [facility] for 45 minutes or longer, depending on the type of exam you are having performed.   If you have any questions regarding your exam or if you need to reschedule, you may call [facility] at [phone] between the hours of 8:00 am and 5:00 pm, Monday-Friday.    _______________________________________________________  If your blood pressure at your visit was 140/90 or greater, please contact your primary care physician to follow up on this.  _______________________________________________________  If you are age 51 or older, your body mass index should be between 23-30. Your Body mass index is 27.61 kg/m. If this is out of the aforementioned range listed, please consider follow up with your Primary Care Provider.  If you are age 71 or younger, your body mass index should be between 19-25. Your Body mass index is 27.61 kg/m. If this is out of the aformentioned range listed, please consider follow up with your Primary Care  Provider.   ________________________________________________________  The [facility] providers would like to encourage you to use [facility] to communicate with providers for non-urgent requests or questions.  Due to long hold times on the telephone, sending your provider a message by [facility] may be a faster and more efficient way to get a response.  Please allow 48 business hours for a response.  Please remember that this is for non-urgent requests.  _______________________________________________________

## 2023-06-15 NOTE — Progress Notes (Addendum)
06/15/2023 Daniel Colon [id number] Feb 05, 1946   HISTORY OF PRESENT ILLNESS: This is a 77 year old male who is a patient Dr. Lauro Franklin.  He has a past medical history of non-small cell lung cancer, coronary artery disease status post stent and then had open heart surgery/CABG in May, less than 2 months ago.  Last colonoscopy in December 2020 with 2 polyps that were 3 to 5 mm in the ascending colon and 1 polyp that was 3 mm in size at the hepatic flexure and diverticulosis in the sigmoid and descending colon as well as internal hemorrhoids.  Pathology showed adenomatous polyp with repeat recommended in 5 years.  He is here today with complaints of constipation with right-sided discomfort and bloating associated with it.  He tells me that ever since his lobectomy in 2021 he has been having issues with constipation.  Says that he had drains placed on that side.  He used to have bowel movements like clockwork in the mornings and now he has to rely on medication to help him move his bowels.  He is convinced that there is something else going on that is causing this.  Was seen for the same issue back in November 2022 by one of our other PAs.  He is not happy to be told that he may need to rely on laxatives to help him move his bowels.  Past Medical History:  Diagnosis Date   Allergy    Arthritis    Barrett's esophagus    CAD (coronary artery disease)    a. Stent to the RCA 1996 (in [place]);  b. ETT(11/13/13): abnormal with early ST changes to suggest ischemia;   c. LHC (11/17/13):  pLAD 30-40, D1 30, mLAD 70-75, mCFX 70, mRCA stent 40 (ISR), EF 55-65%.  Medical Rx   Cataract    left eye removed, right immature   Colon polyps    Dyslipidemia    Dyspnea    GERD (gastroesophageal reflux disease)    History of kidney stones    Hyperlipidemia    Hypertension    Hypogonadism in male    Myocardial infarction [facility]) 1996   Nephrolithiasis    kidney stone   NSCL Ca dx'd 12/2019   Peptic ulcer  disease    PUD (peptic ulcer disease)    Past Surgical History:  Procedure Laterality Date   ANGIOPLASTY     stent - rt cor. art   BILROTH II PROCEDURE     CARDIAC CATHETERIZATION     CIRCUMCISION     COLONOSCOPY     CORONARY ARTERY BYPASS GRAFT N/A 04/27/2023   Procedure: CORONARY ARTERY BYPASS GRAFTING (CABG) X 4, USING LEFT INTERNAL MAMMARY ARTERY AND RIGHT LEG GREATER SAPHENOUS VEIN HARVESTED ENDOSCOPICALLY;  Surgeon: Corliss Skains, MD;  Location: [facility] OR;  Service: Open Heart Surgery;  Laterality: N/A;   CORONARY PRESSURE/FFR STUDY N/A 04/22/2023   Procedure: CORONARY PRESSURE/FFR STUDY;  Surgeon: Kathleene Hazel, MD;  Location: [facility] INVASIVE CV LAB;  Service: Cardiovascular;  Laterality: N/A;   INTERCOSTAL NERVE BLOCK Right 01/24/2020   Procedure: Intercostal Nerve Block;  Surgeon: Delight Ovens, MD;  Location: [facility] OR;  Service: Thoracic;  Laterality: Right;   KNEE ARTHROSCOPY Left    LEFT HEART CATH AND CORONARY ANGIOGRAPHY N/A 04/22/2023   Procedure: LEFT HEART CATH AND CORONARY ANGIOGRAPHY;  Surgeon: Kathleene Hazel, MD;  Location: [facility] INVASIVE CV LAB;  Service: Cardiovascular;  Laterality: N/A;   LEFT HEART CATHETERIZATION WITH CORONARY ANGIOGRAM N/A  11/17/2013   Procedure: LEFT HEART CATHETERIZATION WITH CORONARY ANGIOGRAM;  Surgeon: Micheline Chapman, MD;  Location: [facility] C[facility] LAB;  Service: Cardiovascular;  Laterality: N/A;   LUNG LOBECTOMY  01/2020   had chemo, no radtion   LYMPH NODE DISSECTION N/A 01/24/2020   Procedure: LYMPH NODE DISSECTION;  Surgeon: Delight Ovens, MD;  Location: [facility] OR;  Service: Thoracic;  Laterality: N/A;   NECK LESION BIOPSY     POLYPECTOMY     REFRACTIVE SURGERY     SKIN BIOPSY     Back   TEE WITHOUT CARDIOVERSION N/A 04/27/2023   Procedure: TRANSESOPHAGEAL ECHOCARDIOGRAM;  Surgeon: Corliss Skains, MD;  Location: [facility] OR;  Service: Open Heart Surgery;  Laterality: N/A;   Thigh surgery Left    Infant   ulcer surgery      ULNAR NERVE TRANSPOSITION Right 08/05/2017   Procedure: RIGHT ULNAR NERVE DECOMPRESSION;  Surgeon: Betha Loa, MD;  Location: [facility];  Service: Orthopedics;  Laterality: Right;   UPPER GI ENDOSCOPY     VIDEO BRONCHOSCOPY N/A 01/24/2020   Procedure: VIDEO BRONCHOSCOPY;  Surgeon: Delight Ovens, MD;  Location: [facility] OR;  Service: Thoracic;  Laterality: N/A;   WISDOM TOOTH EXTRACTION      reports that he quit smoking about 11 years ago. His smoking use included cigarettes. He has a 42.00 pack-year smoking history. He has quit using smokeless tobacco.  His smokeless tobacco use included chew. He reports current alcohol use. He reports that he does not use drugs. family history includes CAD (age of onset: 70) in his sister; CAD (age of onset: 83) in his brother and sister; Cancer in his brother; Liver disease in an other family member; Sudden death (age of onset: 23) in his mother. No Known Allergies    Outpatient Encounter Medications as of 06/15/2023  Medication Sig   acetaminophen (TYLENOL) 650 MG CR tablet Take 650 mg by mouth every 8 (eight) hours as needed for pain.   albuterol (VENTOLIN HFA) 108 (90 Base) MCG/ACT inhaler Inhale 2 puffs into the lungs every 6 (six) hours as needed for wheezing or shortness of breath.   aspirin EC 81 MG tablet Take 1 tablet (81 mg total) by mouth daily.   atorvastatin (LIPITOR) 40 MG tablet Take 1 tablet (40 mg total) by mouth at bedtime.   bisacodyl (DULCOLAX) 5 MG EC tablet Take 5 mg by mouth daily as needed for moderate constipation.   BREZTRI AEROSPHERE 160-9-4.8 MCG/ACT AERO Inhale 2 puffs into the lungs in the morning and at bedtime.   Coenzyme Q10 (CO Q-10) 200 MG CAPS Take 200 mg by mouth at bedtime.   docusate sodium (COLACE) 100 MG capsule Take 100 mg by mouth at bedtime.   ezetimibe (ZETIA) 10 MG tablet Take 10 mg by mouth at bedtime.   fish oil-omega-3 fatty acids 1000 MG capsule Take 1,000 mg by mouth at bedtime.    fluorouracil (EFUDEX) 5 % cream Apply 1 Application topically daily as needed (rash).   GNP IRON 200 (65 Fe) MG TABS Take 200 mg by mouth at bedtime.   Lactulose 20 GM/30ML SOLN Take 30 mLs (20 g total) by mouth daily as needed.   metoprolol tartrate (LOPRESSOR) 25 MG tablet Take 1 tablet (25 mg total) by mouth 2 (two) times daily.   Multiple Vitamin (MULTIVITAMIN WITH MINERALS) TABS tablet Take 1 tablet by mouth daily.   omeprazole (PRILOSEC) 10 MG capsule Take 10 mg by mouth at bedtime.  polyethylene glycol (MIRALAX / GLYCOLAX) 17 g packet Take 17 g by mouth daily as needed for mild constipation.   potassium chloride 20 MEQ TBCR Take 1 tablet (20 mEq total) by mouth daily.   senna (SENOKOT) 8.6 MG tablet Take 1 tablet by mouth at bedtime.   sildenafil (VIAGRA) 100 MG tablet Take 100 mg by mouth daily as needed for erectile dysfunction.   torsemide (DEMADEX) 20 MG tablet Take 2 tablets (40 mg total) by mouth daily.   traMADol (ULTRAM) 50 MG tablet Take 1 tablet (50 mg total) by mouth every 6 (six) hours as needed for moderate pain.   VITAMIN B COMPLEX-C PO Take 1 tablet by mouth at bedtime.   No facility-administered encounter medications on file as of 06/15/2023.     REVIEW OF SYSTEMS  : All other systems reviewed and negative except where noted in the History of Present Illness.   PHYSICAL EXAM: BP 132/80   Pulse 70   Ht 5\' 8"  (1.727 m)   Wt 181 lb 9.6 oz (82.4 kg)   BMI 27.61 kg/m  General: Well developed white male in no acute distress Head: Normocephalic and atraumatic Eyes:  Sclerae anicteric, conjunctiva pink. Ears: Normal auditory acuity Lungs: Clear throughout to auscultation; no W/R/R. Heart: Regular rate and rhythm; no M/R/G. Abdomen: Soft, non-distended.  BS present.  Scars noted from previous surgeries.  Some mild right-sided tenderness. Musculoskeletal: Symmetrical with no gross deformities  Skin: No lesions on visible extremities Extremities: No edema   Neurological: Alert oriented x 4, grossly non-focal Psychological:  Alert and cooperative. Normal mood and affect  ASSESSMENT AND PLAN: *Constipation with right-sided abdominal discomfort and bloating: Used to have regular bowel movements until after his lung surgery in 2021.  He has a history of Bill[place]th surgery and then he says that they put drains on the right side of his abdomen when he had his lung surgery.  He says that ever since then he has had issues moving his bowels and gets fullness and bloating on that side.  Could have some scar tissue.  Will plan for CT scan of the abdomen and pelvis with contrast. *History of adenomatous colon polyps: Repeat is due in December 2025.  Could consider sooner if needed, but I think the yield is likely low to show a cause of his constipation.   CC:  Garlan Fillers, MD

## 2023-06-17 ENCOUNTER — Ambulatory Visit: Payer: Medicare Other | Admitting: Student

## 2023-06-17 DIAGNOSIS — I5031 Acute diastolic (congestive) heart failure: Secondary | ICD-10-CM | POA: Diagnosis not present

## 2023-06-17 NOTE — Progress Notes (Signed)
Addendum: Reviewed and agree with assessment and management plan. Erminia Mcnew M, MD  

## 2023-06-18 LAB — BASIC METABOLIC PANEL
BUN/Creatinine Ratio: 18 (ref 10–24)
BUN: 20 mg/dL (ref 8–27)
CO2: 26 mmol/L (ref 20–29)
Calcium: 9.2 mg/dL (ref 8.6–10.2)
Chloride: 99 mmol/L (ref 96–106)
Creatinine, Ser: 1.14 mg/dL (ref 0.76–1.27)
Glucose: 97 mg/dL (ref 70–99)
Potassium: 4 mmol/L (ref 3.5–5.2)
Sodium: 142 mmol/L (ref 134–144)
eGFR: 67 mL/min/{1.73_m2} (ref >59)

## 2023-06-21 ENCOUNTER — Encounter: Payer: Self-pay | Admitting: *Deleted

## 2023-06-21 NOTE — Progress Notes (Unsigned)
Cardiology Office Note   Date:  06/23/2023  ID:  Daniel Colon, Daniel Colon 1946/12/12, MRN [id number] PCP:  Garlan Fillers, MD [facility] Cardiologist: Rollene Rotunda, MD  Reason for visit: 2 week follow-up  History of Present Illness    Daniel Colon is a 77 y.o. male with a hx of CAD, s/p stent to RCA in 1996 in [place] and non-small cell lung cancer status post status post robotic assisted right upper lobectomy by Dr. Nydia Bouton in 2021 & chemotherapy, 42-pack-year cigarette smoker having quit in 2012, hypertension, hyperlipidemia.   He saw Dr. Antoine Poche in March 2024 for preop clearance prior to possible hip surgery.  Patient noted some decreased exercise tolerance, muscle weakness and some dyspnea/chest discomfort on exertion.   He had high risk cardiac PET.  LHC 04/22/23 showed 3 vessel CAD.  He underwent CABG x 4 utilizing LIMA to LAD, SVG to OM, SVG to Diagonal, SVG to PDA.  With postop blood loss, he was transfused 2 units and later another 2 units.  He remained in normal sinus rhythm.  Dr. Antoine Poche recommended waiting 2 months until hip surgery.   I saw him on May 19, 2023.  He was experiencing shortness of breath, weight gain and swelling.  He stated that at bad times he will feel like he is huffing and puffing after walking from the bed to the living room.  Other times he can walk half a mile.  His weight at that appointment was 198 pounds with baseline weight around 190-191 lbs.  He was prescribed Lasix and Zaroxolyn x 3 days by CTS.  I followed up with him over the phone.  He still has significant shortness of breath despite diuresis.  With positive outpatient D-dimer, I sent him to the ED for CT.  CT was negative for PE.  It did show bilateral pleural effusions.  He was given IV Lasix 40 mg x 1 in the ER.  Recommended he take Lasix 40 mg 2 tablets daily for 3 days and keep his follow-up with Dr. Cliffton Asters.  Dr. Cliffton Asters on June 7, he was doing better.  Dr. Cliffton Asters cleared him to  start cardiac rehab in 2 weeks and follow-up as needed.  Dr. Antoine Poche saw him on June 17.  With persistent fluid retention, he stop Lasix and start torsemide 40 mg daily.  With continued constipation (chronic since lobectomy in 2021), he was referred back to his gastroenterologist.  Aspirin decreased from 325 to 81 mg daily.  He was not felt to be ready for cardiac rehab.  He saw his gastroenterologist on June 25 who ordered CT of the abdomen pelvis.  He is currently scheduled for hip surgery on August 19.  Today, he is here for reassessment.  He states he lost weight/fluid quickly on torsemide 20mg  2 tablets.  He messaged our office on June 18.  His torsemide was cut down to torsemide 20 mg once daily with potassium 20 mEq daily.  Since then his lower extremity edema has resolved.  His weight on his scale this morning was 175 pounds.  He states this is his lowest weight since retiring from the [employer] 29 years ago.  He feels well.  He has been able to golf 18 holes twice.  He no longer feels short of breath walking room to room.  He may feel slight shortness of breath if he overexerts himself in a hurry.  He denies PND, orthopnea.  He is mindful of his salt intake.  He mentions some leg and hand cramping at night.     Objective / Physical Exam   Vital signs:  BP 116/66 (BP Location: Left Arm, Patient Position: Sitting, Cuff Size: Normal)   Pulse 68   Ht 5' 8.5" (1.74 m)   Wt 177 lb (80.3 kg)   SpO2 93%   BMI 26.52 kg/m     GEN: No acute distress NECK: No carotid bruits CARDIAC: RRR, no murmurs RESPIRATORY:  Clear to auscultation without rales, wheezing or rhonchi  EXTREMITIES: No edema  Assessment and Plan   Acute diastolic heart failure, now euvolemic -IntraOp echo showed EF 55 to 60%.  Moderate LVH.  No significant valve disease. 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 -Switched from Lasix to torsemide 40 mg on June 17.  Torsemide decreased to 20 mg daily on June 18 after quick  diuresis. -Now euvolemic, labs on June 27 with normal creatinine and potassium of 4. -Recommend cutting torsemide dose to 10 mg once daily. (Half a 20 mg tablet daily).  Decrease potassium supplement to 10 mEq daily. -I think his cramping will improve with cutting back to diuretic dosing.   -Recommend checking BMET in 5 to 7 days with magnesium level.   -Recommend office follow-up before he goes on vacation on July 28.  At that time we will consider changing torsemide to as needed dosing now that his postop fluid retention has resolved.   -If fluid control is a long-term issue, recommending a maintenance medication that can help fluid control like or Jardiance/Farxiga +/- spironolactone/eplerenone.      Coronary artery disease status post CABG -stent to RCA in 1996 in [place]  -CABG x 4 utilizing LIMA to LAD, SVG to OM, SVG to Diagonal, SVG to PDA -Denies chest pain. -Continue aspirin and statin therapy. -Cleared to start cardiac rehab.   Hypertension, well-controlled -Continues to be controlled off Micardis & hydrochlorothiazide.   -Goal BP is <130/80.  Recommend DASH diet (high in vegetables, fruits, low-fat dairy products, whole grains, poultry, fish, and nuts and low in sweets, sugar-sweetened beverages, and red meats), salt restriction and increase physical activity.   Hyperlipidemia with goal LDL less than 70 -LDL 53 in January 2024.  Continue Lipitor and Zetia.   Disposition - Follow-up in 3 weeks before he goes on vacation.  At that time, we will reassess his volume on decreased diuretic dose.  We can also do his preop clearance for hip surgery scheduled in August.   Signed, Bernette Mayers  06/23/2023 [facility]

## 2023-06-23 ENCOUNTER — Encounter: Payer: Self-pay | Admitting: Physician Assistant

## 2023-06-23 ENCOUNTER — Ambulatory Visit: Payer: Medicare Other | Attending: Physician Assistant | Admitting: Physician Assistant

## 2023-06-23 VITALS — BP 116/66 | HR 68 | Ht 68.5 in | Wt 177.0 lb

## 2023-06-23 DIAGNOSIS — I2510 Atherosclerotic heart disease of native coronary artery without angina pectoris: Secondary | ICD-10-CM | POA: Diagnosis not present

## 2023-06-23 DIAGNOSIS — E785 Hyperlipidemia, unspecified: Secondary | ICD-10-CM | POA: Diagnosis not present

## 2023-06-23 DIAGNOSIS — Z79899 Other long term (current) drug therapy: Secondary | ICD-10-CM | POA: Diagnosis not present

## 2023-06-23 DIAGNOSIS — I10 Essential (primary) hypertension: Secondary | ICD-10-CM | POA: Diagnosis not present

## 2023-06-23 DIAGNOSIS — I5031 Acute diastolic (congestive) heart failure: Secondary | ICD-10-CM | POA: Insufficient documentation

## 2023-06-23 MED ORDER — POTASSIUM CHLORIDE ER 10 MEQ PO TBCR: 10.0000 meq | EXTENDED_RELEASE_TABLET | Freq: Every day | ORAL | 3 refills | Status: DC

## 2023-06-23 NOTE — Patient Instructions (Addendum)
Medication Instructions:  Your physician has recommended you make the following change in your medication:  DECREASE: Torsemide 10 mg (half tablet) once daily DECREASE: Potassium 10 mEq (one tablet) once daily *If you need a refill on your cardiac medications before your next appointment, please call your pharmacy*   Lab Work: Your physician recommends that you return for lab work in 5-7 days: BMET, Mag If you have labs (blood work) drawn today and your tests are completely normal, you will receive your results only by: MyChart Message (if you have MyChart) OR A paper copy in the mail If you have any lab test that is abnormal or we need to change your treatment, we will call you to review the results.   Testing/Procedures: None   Follow-Up: At [facility], you and your health needs are our priority.  As part of our continuing mission to provide you with exceptional heart care, we have created designated Provider Care Teams.  These Care Teams include your primary Cardiologist (physician) and Advanced Practice Providers (APPs -  Physician Assistants and Nurse Practitioners) who all work together to provide you with the care you need, when you need it.   Your next appointment:    Before July 28th (2-3 weeks)  Provider:   [name], PA-C     Other instructions: Okay to start Cardiac Rehab

## 2023-06-25 ENCOUNTER — Ambulatory Visit (HOSPITAL_COMMUNITY)
Admission: RE | Admit: 2023-06-25 | Discharge: 2023-06-25 | Disposition: A | Discharge status: Home / Self Care | Payer: Medicare Other | Source: Ambulatory Visit | Attending: Gastroenterology | Admitting: Gastroenterology

## 2023-06-25 DIAGNOSIS — K4090 Unilateral inguinal hernia, without obstruction or gangrene, not specified as recurrent: Secondary | ICD-10-CM | POA: Diagnosis not present

## 2023-06-25 DIAGNOSIS — K7689 Other specified diseases of liver: Secondary | ICD-10-CM | POA: Diagnosis not present

## 2023-06-25 DIAGNOSIS — N200 Calculus of kidney: Secondary | ICD-10-CM | POA: Diagnosis not present

## 2023-06-25 DIAGNOSIS — K5900 Constipation, unspecified: Secondary | ICD-10-CM | POA: Insufficient documentation

## 2023-06-25 MED ORDER — IOHEXOL 9 MG/ML PO SOLN: 500.0000 mL | ORAL | Status: AC

## 2023-06-25 MED ORDER — IOHEXOL 300 MG/ML  SOLN
100.0000 mL | Freq: Once | INTRAMUSCULAR | Status: AC | PRN
  Administered 2023-06-25: 100 mL via INTRAVENOUS

## 2023-06-25 MED ORDER — IOHEXOL 9 MG/ML PO SOLN
1000.0000 mL | ORAL | Status: AC
  Administered 2023-06-25: 1000 mL via ORAL

## 2023-06-25 MED ORDER — IOHEXOL 9 MG/ML PO SOLN
ORAL | Status: AC
  Filled 2023-06-25: qty 1000

## 2023-07-01 ENCOUNTER — Telehealth: Payer: Self-pay | Admitting: Gastroenterology

## 2023-07-01 DIAGNOSIS — Z79899 Other long term (current) drug therapy: Secondary | ICD-10-CM | POA: Diagnosis not present

## 2023-07-01 LAB — BASIC METABOLIC PANEL
BUN/Creatinine Ratio: 18 (ref 10–24)
BUN: 18 mg/dL (ref 8–27)
CO2: 22 mmol/L (ref 20–29)
Calcium: 9.1 mg/dL (ref 8.6–10.2)
Chloride: 106 mmol/L (ref 96–106)
Creatinine, Ser: 1 mg/dL (ref 0.76–1.27)
Glucose: 103 mg/dL — ABNORMAL HIGH (ref 70–99)
Potassium: 3.9 mmol/L (ref 3.5–5.2)
Sodium: 144 mmol/L (ref 134–144)
eGFR: 79 mL/min/{1.73_m2} (ref >59)

## 2023-07-01 LAB — MAGNESIUM: Magnesium: 2.3 mg/dL (ref 1.6–2.3)

## 2023-07-01 NOTE — Telephone Encounter (Signed)
Inbound call from patient in regards to results. Please advise.  Thank you

## 2023-07-01 NOTE — Telephone Encounter (Signed)
Patient has been advised that unfortunately, we do not have results back yet from his CT abdomen/pelvis. Advised that as soon as these become available, we will be in touch. Patient verbalizes understanding.

## 2023-07-07 ENCOUNTER — Ambulatory Visit: Payer: Medicare Other | Admitting: Physician Assistant

## 2023-07-09 ENCOUNTER — Telehealth: Payer: Self-pay

## 2023-07-09 NOTE — Telephone Encounter (Addendum)
Called patient regarding results. Patient had understanding of results.----- Message from Cannon Kettle sent at 07/06/2023 10:15 PM EDT ----- Normal kidney function, potassium and magnesium levels.

## 2023-07-13 ENCOUNTER — Telehealth: Payer: Self-pay | Admitting: *Deleted

## 2023-07-13 NOTE — H&P (Signed)
TOTAL HIP ADMISSION H&P  Patient is admitted for right total hip arthroplasty.  Subjective:  Chief Complaint: Right hip pain  HPI: Daniel Colon, 77 y.o. male, has a history of pain and functional disability in the right hip due to arthritis and patient has failed non-surgical conservative treatments for greater than 12 weeks to include NSAID's and/or analgesics and activity modification. Onset of symptoms was gradual, starting  several  years ago with gradually worsening course since that time. The patient noted no past surgery on the right hip. Patient currently rates pain in the right hip at 8 out of 10 with activity. Patient has night pain, worsening of pain with activity and weight bearing, pain that interfers with activities of daily living, and pain with passive range of motion. Patient has evidence of  bone-on-bone arthritis in that right hip with subchondral cystic formation and marginal osteophyte formation  by imaging studies. This condition presents safety issues increasing the risk of falls. There is no current active infection.  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/03/2020   Right sided abdominal pain 03/19/2020   Goals of care, counseling/discussion 02/13/2020   Encounter for antineoplastic chemotherapy 02/13/2020   S/P lobectomy of lung 01/24/2020   Lung cancer (HCC) 01/24/2020   Preop cardiovascular exam 01/11/2020   Educated about COVID-19 virus infection 01/11/2020   Abnormal screening CT of chest 01/05/2020   Rectal bleeding 11/13/2019   Dyslipidemia 10/01/2019   Laceration of left ring finger without foreign body without damage to nail  11/24/2017   Hypertension 11/27/2013   HLD (hyperlipidemia) 11/27/2013   GERD (gastroesophageal reflux disease) 10/30/2013   Barrett's esophagus 10/30/2013   History of adenomatous polyp of colon 10/30/2013   CAD in native artery 10/02/2013    Past Medical History:  Diagnosis Date   Allergy    Arthritis    Barrett's esophagus    CAD (coronary artery disease)    a. Stent to the RCA 1996 (in [place]);  b. ETT(11/13/13): abnormal with early ST changes to suggest ischemia;   c. LHC (11/17/13):  pLAD 30-40, D1 30, mLAD 70-75, mCFX 70, mRCA stent 40 (ISR), EF 55-65%.  Medical Rx   Cataract    left eye removed, right immature   Colon polyps    Dyslipidemia    Dyspnea    GERD (gastroesophageal reflux disease)    History of kidney stones    Hyperlipidemia    Hypertension    Hypogonadism in male    Myocardial infarction [facility]) 1996   Nephrolithiasis    kidney stone   NSCL Ca dx'd 12/2019   Peptic ulcer disease    PUD (peptic ulcer disease)     Past Surgical History:  Procedure Laterality Date   ANGIOPLASTY     stent - rt cor. art   BILROTH II PROCEDURE     CARDIAC CATHETERIZATION     CIRCUMCISION     COLONOSCOPY     CORONARY ARTERY BYPASS GRAFT N/A 04/27/2023   Procedure: CORONARY ARTERY BYPASS GRAFTING (CABG) X 4, USING LEFT INTERNAL MAMMARY ARTERY AND RIGHT LEG GREATER SAPHENOUS VEIN HARVESTED ENDOSCOPICALLY;  Surgeon: Corliss Skains, MD;  Location: [facility] OR;  Service: Open Heart Surgery;  Laterality: N/A;   CORONARY PRESSURE/FFR STUDY N/A 04/22/2023   Procedure: CORONARY PRESSURE/FFR STUDY;  Surgeon: Kathleene Hazel, MD;  Location: [facility] INVASIVE CV LAB;  Service: Cardiovascular;  Laterality: N/A;   INTERCOSTAL NERVE BLOCK Right 01/24/2020   Procedure: Intercostal Nerve Block;  Surgeon: Delight Ovens, MD;  Location: [facility] OR;  Service: Thoracic;  Laterality: Right;   KNEE ARTHROSCOPY Left    LEFT HEART CATH AND CORONARY ANGIOGRAPHY N/A 04/22/2023   Procedure: LEFT HEART  CATH AND CORONARY ANGIOGRAPHY;  Surgeon: Kathleene Hazel, MD;  Location: [facility] INVASIVE CV LAB;  Service: Cardiovascular;  Laterality: N/A;   LEFT HEART CATHETERIZATION WITH CORONARY ANGIOGRAM N/A 11/17/2013   Procedure: LEFT HEART CATHETERIZATION WITH CORONARY ANGIOGRAM;  Surgeon: Micheline Chapman, MD;  Location: [facility] CATH LAB;  Service: Cardiovascular;  Laterality: N/A;   LUNG LOBECTOMY  01/2020   had chemo, no radtion   LYMPH NODE DISSECTION N/A 01/24/2020   Procedure: LYMPH NODE DISSECTION;  Surgeon: Delight Ovens, MD;  Location: [facility] OR;  Service: Thoracic;  Laterality: N/A;   NECK LESION BIOPSY     POLYPECTOMY     REFRACTIVE SURGERY     SKIN BIOPSY     Back   TEE WITHOUT CARDIOVERSION N/A 04/27/2023   Procedure: TRANSESOPHAGEAL ECHOCARDIOGRAM;  Surgeon: Corliss Skains, MD;  Location: [facility] OR;  Service: Open Heart Surgery;  Laterality: N/A;   Thigh surgery Left    Infant   ulcer surgery     ULNAR NERVE TRANSPOSITION Right 08/05/2017   Procedure: RIGHT ULNAR NERVE DECOMPRESSION;  Surgeon: Betha Loa, MD;  Location: [facility];  Service: Orthopedics;  Laterality: Right;   UPPER GI ENDOSCOPY     VIDEO BRONCHOSCOPY N/A 01/24/2020   Procedure: VIDEO BRONCHOSCOPY;  Surgeon: Delight Ovens, MD;  Location: [facility] OR;  Service: Thoracic;  Laterality: N/A;   WISDOM TOOTH EXTRACTION      Prior to Admission medications   Medication Sig Start Date End Date Taking? Authorizing Provider  acetaminophen (TYLENOL) 650 MG CR tablet Take 650 mg by mouth every 8 (eight) hours as needed for pain.    [provider]  albuterol (VENTOLIN HFA) 108 (90 Base) MCG/ACT inhaler Inhale 2 puffs into the lungs every 6 (six) hours as needed for wheezing or shortness of breath. 11/19/20   [provider]  aspirin EC 81 MG tablet Take 1 tablet (81 mg total) by mouth daily. 06/07/23   Rollene Rotunda, MD  atorvastatin (LIPITOR) 40 MG tablet Take 1 tablet (40 mg total) by mouth  at bedtime. 05/05/23   Rollene Rotunda, MD  bisacodyl (DULCOLAX) 5 MG EC tablet Take 5 mg by mouth daily as needed for moderate constipation.    [provider]  BREZTRI AEROSPHERE 160-9-4.8 MCG/ACT AERO Inhale 2 puffs into the lungs in the morning and at bedtime. 06/25/20   [provider]  Coenzyme Q10 (CO Q-10) 200 MG CAPS Take 200 mg by mouth at bedtime.    [provider]  docusate sodium (COLACE) 100 MG capsule Take 100 mg by mouth at bedtime.    [provider]  ezetimibe (ZETIA) 10 MG tablet Take 10 mg by mouth at bedtime.    [provider]  fish oil-omega-3 fatty acids 1000 MG capsule Take 1,000 mg by mouth at bedtime.    [provider]  fluorouracil (EFUDEX) 5 % cream Apply 1 Application topically daily as needed (rash). 11/04/22   [provider]  GNP IRON 200 (65 Fe) MG TABS Take 200 mg by mouth at bedtime. 04/27/22   [provider]  Lactulose 20 GM/30ML SOLN Take 30 mLs (20 g total) by mouth daily as needed. 05/18/23   Barrett, Erin R, PA-C  metoprolol tartrate (LOPRESSOR) 25 MG tablet Take 1 tablet (25 mg total) by mouth 2 (two) times daily. Patient not taking: Reported on 06/23/2023 05/12/23   Rollene Rotunda, MD  Multiple Vitamin (MULTIVITAMIN WITH MINERALS) TABS tablet Take 1 tablet by mouth daily.    [provider]  omeprazole (PRILOSEC) 10 MG capsule Take 10 mg by mouth at bedtime.    [provider]  polyethylene glycol (MIRALAX / GLYCOLAX) 17 g packet Take 17 g by mouth daily as needed for mild constipation.    [provider]  potassium chloride (KLOR-CON) 10 MEQ tablet Take 1 tablet (10 mEq total) by mouth daily. 06/23/23 09/21/23  Cannon Kettle, PA-C  senna (SENOKOT) 8.6 MG tablet Take 1 tablet by mouth at bedtime.    [provider]  sildenafil (VIAGRA) 100 MG tablet Take 100 mg by mouth daily as needed for erectile dysfunction. 12/23/21   [provider]   torsemide (DEMADEX) 20 MG tablet Take 2 tablets (40 mg total) by mouth daily. 06/07/23 09/05/23  Rollene Rotunda, MD  traMADol (ULTRAM) 50 MG tablet Take 1 tablet (50 mg total) by mouth every 6 (six) hours as needed for moderate pain. 05/02/23   Ardelle Balls, PA-C  VITAMIN B COMPLEX-C PO Take 1 tablet by mouth at bedtime.    [provider]    No Known Allergies  Social History   Socioeconomic History   Marital status: Married    Spouse name: Not on file   Number of children: 1   Years of education: Not on file   Highest education level: Not on file  Occupational History   Occupation: retired  Tobacco Use   Smoking status: Former    Current packs/day: 0.00    Average packs/day: 1 pack/day for 42.0 years (42.0 ttl pk-yrs)    Types: Cigarettes    Start date: 06/20/1969    Quit date: 06/21/2011    Years since quitting: 12.0   Smokeless tobacco: Former    Types: Engineer, drilling   Vaping status: Never Used  Substance and Sexual Activity   Alcohol use: Yes    Alcohol/week: 0.0 standard drinks of alcohol    Comment: social   Drug use: No   Sexual activity: Not on file  Other Topics Concern   Not on file  Social History Narrative   Lives with wife and mother in law.    Social Determinants of Health   Financial Resource Strain: Not on file  Food Insecurity: No Food Insecurity (04/25/2023)   Hunger Vital Sign    Worried About Running Out of Food in the Last Year: Never true    Ran Out of Food in the Last Year: Never true  Transportation Needs: No Transportation Needs (04/25/2023)   PRAPARE - Administrator, Civil Service (Medical): No    Lack of Transportation (Non-Medical): No  Physical Activity: Not on file  Stress: Not on file  Social Connections: Not on file  Intimate Partner Violence: Not At Risk (04/25/2023)   Humiliation, Afraid, Rape, and Kick questionnaire    Fear of Current or Ex-Partner: No    Emotionally Abused: No    Physically Abused:  No  Sexually Abused: No    Tobacco Use: Medium Risk (06/23/2023)   Patient History    Smoking Tobacco Use: Former    Smokeless Tobacco Use: Former    Passive Exposure: Not on file   Social History   Substance and Sexual Activity  Alcohol Use Yes   Alcohol/week: 0.0 standard drinks of alcohol   Comment: social    Family History  Problem Relation Age of Onset   Sudden death Mother 7   CAD Brother 83       CABG   CAD Sister 83       CABG   CAD Sister 61       CABG   Cancer Brother    Liver disease Other    Colon cancer Neg Hx    Colon polyps Neg Hx    Esophageal cancer Neg Hx    Rectal cancer Neg Hx    Stomach cancer Neg Hx     Review of Systems  Constitutional:  Negative for chills and fever.  HENT:  Negative for congestion, sore throat and tinnitus.   Eyes:  Negative for double vision, photophobia and pain.  Respiratory:  Negative for cough, shortness of breath and wheezing.   Cardiovascular:  Negative for chest pain, palpitations and orthopnea.  Gastrointestinal:  Negative for heartburn, nausea and vomiting.  Genitourinary:  Negative for dysuria, frequency and urgency.  Musculoskeletal:  Positive for joint pain.  Neurological:  Negative for dizziness, weakness and headaches.     Objective:  Physical Exam: Well nourished and well developed.  General: Alert and oriented x3, cooperative and pleasant, no acute distress.  Head: normocephalic, atraumatic, neck supple.  Eyes: EOMI.  Musculoskeletal:  His right hip could be flexed to 90 degrees, no internal rotation, only about 10 degrees of external rotation, 20 degrees of abduction with discomfort. His right leg is about 0.5 inch longer than the left from that left lower extremity deformity that he had as a child. There is no trochanteric tenderness  Calves soft and nontender. Motor function intact in LE. Strength 5/5 LE bilaterally. Neuro: Distal pulses 2+. Sensation to light touch intact in LE.  Imaging  Review Plain radiographs demonstrate severe degenerative joint disease of the right hip. The bone quality appears to be adequate for age and reported activity level.  Assessment/Plan:  End stage arthritis, right hip  The patient history, physical examination, clinical judgement of the provider and imaging studies are consistent with end stage degenerative joint disease of the right hip and total hip arthroplasty is deemed medically necessary. The treatment options including medical management, injection therapy, arthroscopy and arthroplasty were discussed at length. The risks and benefits of total hip arthroplasty were presented and reviewed. The risks due to aseptic loosening, infection, stiffness, dislocation/subluxation, thromboembolic complications and other imponderables were discussed. The patient acknowledged the explanation, agreed to proceed with the plan and consent was signed. Patient is being admitted for inpatient treatment for surgery, pain control, PT, OT, prophylactic antibiotics, VTE prophylaxis, progressive ambulation and ADLs and discharge planning.The patient is planning to be discharged  home .   Patient's anticipated LOS is less than 2 midnights, meeting these requirements: - Younger than 71 - Lives within 1 hour of care - Has a competent adult at home to recover with post-op recover - NO history of  - Chronic pain requiring opioids  - Diabetes  - Heart failure  - Stroke  - DVT/VTE  - Cardiac arrhythmia  - Respiratory Failure/COPD  - Renal failure  -  Anemia  - Advanced Liver disease  Therapy Plans: HEP Disposition: Home with wife Planned DVT Prophylaxis: Xarelto 10 mg QD DME Needed: None PCP: Jarome Matin, MD (clearance from 03/2023) Cardiologist: Rollene Rotunda, MD  TXA: IV Allergies: NKDA Anesthesia Concerns: Agitaton with previous general anesthesia BMI: 27.1 Last HgbA1c: Not diabetic Pain Regimen: Hydrocodone [facility]acy: [facility]   Other: - CABG  x4 on 04/27/2023 - on 81 mg ASA every day - PMH also includes CAD with stents, gastric ulcer and lung CA (right upper lobe removed) - Faxing clearance form over to Dr. Jenene Slicker office, he is aware and ok with August surgery - Had appointment scheduled with Hochrein's PA Victorino Dike that was for clearance s/p CABG, had to reschedule and was put on for September. Patient is gong to contact office today for earlier appointment or clearance form.  - Patient was instructed on what medications to stop prior to surgery. - Follow-up visit in 2 weeks with Dr. Lequita Halt - Begin physical therapy following surgery - Pre-operative lab work as pre-surgical testing - Prescriptions will be provided in hospital at time of discharge  Arther Abbott, PA-C Orthopedic Surgery [facility] [place]

## 2023-07-13 NOTE — Telephone Encounter (Signed)
I s/w the pt and his wife and and the pt has been scheduled to see Micah Flesher, [facility] for pre op clearance as the appt in 08/2023 is not going to be in time for the surgery for the pt. We agreed to not cancel the 08/2023 appt as that can be discussed at the 08/03/23 appt, if ok to cancel. I will update all involved.

## 2023-07-13 NOTE — Telephone Encounter (Signed)
   Name: Daniel Colon  DOB: August 31, 1946  MRN: [id number]  Primary Cardiologist: Rollene Rotunda, MD  Chart reviewed as part of pre-operative protocol coverage. Because of [name] past medical history and time since last visit, he will require a follow-up in-office visit in order to better assess preoperative cardiovascular risk.  Patient was last seen in the office on 06/23/2023.  In office follow-up was recommended for preoperative cardiac evaluation.  Pre-op covering staff: - Please schedule appointment and call patient to inform them. If patient already had an upcoming appointment within acceptable timeframe, please add "pre-op clearance" to the appointment notes so provider is aware. - Please contact requesting surgeon's office via preferred method (i.e, phone, fax) to inform them of need for appointment prior to surgery.   Joylene Grapes, NP  07/13/2023, 3:36 PM

## 2023-07-13 NOTE — Telephone Encounter (Signed)
   Pre-operative Risk Assessment    Patient Name: Daniel Colon  DOB: 04-Jan-1946 MRN: [id number]      Request for Surgical Clearance    Procedure:   Right total hip arthroplasty  Date of Surgery:  Clearance 08/09/23                                 Surgeon:  Dr. Ollen Gross Surgeon's Group or Practice Name:  [facility] Phone number:  [phone] Fax number:  [phone]   Type of Clearance Requested:   - Medical  - Pharmacy:  Hold Aspirin Not Indicated   Type of Anesthesia:  Choice   Additional requests/questions:  Pt seen [name], PA on July 3,2024.  Signed, Emmit Pomfret   07/13/2023, 1:01 PM

## 2023-07-25 NOTE — Progress Notes (Signed)
Cardiology Office Note:    Date:  08/03/2023   ID:  Daniel Colon, Daniel Colon Dec 12, 1946, MRN [id number]  PCP:  Garlan Fillers, MD    [facility] Providers Cardiologist:  Rollene Rotunda, MD { Referring MD: Garlan Fillers, MD   Chief Complaint  Patient presents with   Follow-up    CAD   Pre-op Exam    CAD    History of Present Illness:    Daniel Colon is a 77 y.o. male with a hx of CAD s/p stenting to RCA in 1996, now CABG x 4 04/2023, non-small cell lung cancer s/p RUL lobectomy and chemotherapy, prior tobacco abuse, hypertension, hyperlipidemia.  He saw Dr. Antoine Poche 02/2023 for preop clearance prior to hip surgery.  Due to decreased exercise tolerance, dyspnea on exertion, and chest discomfort he underwent PET stress test that was high risk.  Follow-up LHC 04/22/2023 showed three-vessel CAD.  He subsequently underwent CABG x 4: LIMA-LAD, SVG-OM, SVG-diagonal, SVG-PDA on 04/27/2023.  Dr. Antoine Poche recommended waiting 2 months until hip surgery.  Through cardiology follow-up, he continues to report dyspnea and lower extremity swelling.  Positive D-dimer led to ER visit for CTA which was negative for PE but did show bilateral pleural effusions.  He was initially diuresed with Lasix but this was transitioned to torsemide with significant diuresis.  He was last seen by [name], PA-C 06/23/2023 and torsemide was reduced to 10 mg daily.  Initial plan was to reevaluate for continued need for scheduled diuretic versus as needed dosing.  He presents today for reevaluation of fluid status and preoperative risk evaluation prior to hip surgery. He has chronic dyspnea on exertion since his lung surgery. He just came [place] from [place] in which he walked on average 2.5 miles daily. He just flew back home last night and has some lower extremity swelling because he did not take torsemide while traveling. He has no orthopnea. His mobility is primarily limited by his hip pain. Lungs clear  on exam.    Past Medical History:  Diagnosis Date   Allergy    Arthritis    Barrett's esophagus    CAD (coronary artery disease)    a. Stent to the RCA 1996 (in [place]);  b. ETT(11/13/13): abnormal with early ST changes to suggest ischemia;   c. LHC (11/17/13):  pLAD 30-40, D1 30, mLAD 70-75, mCFX 70, mRCA stent 40 (ISR), EF 55-65%.  Medical Rx   Cataract    left eye removed, right immature   Colon polyps    Dyslipidemia    Dyspnea    GERD (gastroesophageal reflux disease)    History of kidney stones    Hyperlipidemia    Hypertension    Hypogonadism in male    Myocardial infarction [facility]) 1996   Nephrolithiasis    kidney stone   NSCL Ca dx'd 12/2019   Peptic ulcer disease    PUD (peptic ulcer disease)     Past Surgical History:  Procedure Laterality Date   ANGIOPLASTY     stent - rt cor. art   BILROTH II PROCEDURE     CARDIAC CATHETERIZATION     CIRCUMCISION     COLONOSCOPY     CORONARY ARTERY BYPASS GRAFT N/A 04/27/2023   Procedure: CORONARY ARTERY BYPASS GRAFTING (CABG) X 4, USING LEFT INTERNAL MAMMARY ARTERY AND RIGHT LEG GREATER SAPHENOUS VEIN HARVESTED ENDOSCOPICALLY;  Surgeon: Corliss Skains, MD;  Location: [facility] OR;  Service: Open Heart Surgery;  Laterality: N/A;  CORONARY PRESSURE/FFR STUDY N/A 04/22/2023   Procedure: CORONARY PRESSURE/FFR STUDY;  Surgeon: Kathleene Hazel, MD;  Location: [facility] INVASIVE CV LAB;  Service: Cardiovascular;  Laterality: N/A;   INTERCOSTAL NERVE BLOCK Right 01/24/2020   Procedure: Intercostal Nerve Block;  Surgeon: Delight Ovens, MD;  Location: [facility] OR;  Service: Thoracic;  Laterality: Right;   KNEE ARTHROSCOPY Left    LEFT HEART CATH AND CORONARY ANGIOGRAPHY N/A 04/22/2023   Procedure: LEFT HEART CATH AND CORONARY ANGIOGRAPHY;  Surgeon: Kathleene Hazel, MD;  Location: [facility] INVASIVE CV LAB;  Service: Cardiovascular;  Laterality: N/A;   LEFT HEART CATHETERIZATION WITH CORONARY ANGIOGRAM N/A 11/17/2013   Procedure: LEFT HEART  CATHETERIZATION WITH CORONARY ANGIOGRAM;  Surgeon: Micheline Chapman, MD;  Location: [facility] CATH LAB;  Service: Cardiovascular;  Laterality: N/A;   LUNG LOBECTOMY  01/2020   had chemo, no radtion   LYMPH NODE DISSECTION N/A 01/24/2020   Procedure: LYMPH NODE DISSECTION;  Surgeon: Delight Ovens, MD;  Location: [facility] OR;  Service: Thoracic;  Laterality: N/A;   NECK LESION BIOPSY     POLYPECTOMY     REFRACTIVE SURGERY     SKIN BIOPSY     Back   TEE WITHOUT CARDIOVERSION N/A 04/27/2023   Procedure: TRANSESOPHAGEAL ECHOCARDIOGRAM;  Surgeon: Corliss Skains, MD;  Location: [facility] OR;  Service: Open Heart Surgery;  Laterality: N/A;   Thigh surgery Left    Infant   ulcer surgery     ULNAR NERVE TRANSPOSITION Right 08/05/2017   Procedure: RIGHT ULNAR NERVE DECOMPRESSION;  Surgeon: Betha Loa, MD;  Location: [facility];  Service: Orthopedics;  Laterality: Right;   UPPER GI ENDOSCOPY     VIDEO BRONCHOSCOPY N/A 01/24/2020   Procedure: VIDEO BRONCHOSCOPY;  Surgeon: Delight Ovens, MD;  Location: [facility] OR;  Service: Thoracic;  Laterality: N/A;   WISDOM TOOTH EXTRACTION      Current Medications: Current Meds  Medication Sig   acetaminophen (TYLENOL) 650 MG CR tablet Take 650 mg by mouth every 8 (eight) hours as needed for pain.   albuterol (VENTOLIN HFA) 108 (90 Base) MCG/ACT inhaler Inhale 2 puffs into the lungs every 6 (six) hours as needed for wheezing or shortness of breath.   aspirin EC 81 MG tablet Take 1 tablet (81 mg total) by mouth daily.   atorvastatin (LIPITOR) 40 MG tablet Take 1 tablet (40 mg total) by mouth at bedtime.   bisacodyl (DULCOLAX) 5 MG EC tablet Take 5 mg by mouth daily as needed for moderate constipation.   BREZTRI AEROSPHERE 160-9-4.8 MCG/ACT AERO Inhale 2 puffs into the lungs in the morning and at bedtime.   Coenzyme Q10 (CO Q-10) 200 MG CAPS Take 200 mg by mouth at bedtime.   docusate sodium (COLACE) 100 MG capsule Take 100 mg by mouth at bedtime.    ezetimibe (ZETIA) 10 MG tablet Take 10 mg by mouth at bedtime.   fish oil-omega-3 fatty acids 1000 MG capsule Take 1,000 mg by mouth at bedtime.   fluorouracil (EFUDEX) 5 % cream Apply 1 Application topically daily as needed (rash).   GNP IRON 200 (65 Fe) MG TABS Take 200 mg by mouth at bedtime.   metoprolol tartrate (LOPRESSOR) 25 MG tablet Take 1 tablet (25 mg total) by mouth 2 (two) times daily.   Multiple Vitamin (MULTIVITAMIN WITH MINERALS) TABS tablet Take 1 tablet by mouth daily.   omeprazole (PRILOSEC) 10 MG capsule Take 10 mg by mouth at bedtime.   polyethylene glycol (MIRALAX /  GLYCOLAX) 17 g packet Take 17 g by mouth daily as needed for mild constipation.   potassium chloride (KLOR-CON) 10 MEQ tablet Take 1 tablet (10 mEq total) by mouth daily.   senna (SENOKOT) 8.6 MG tablet Take 1 tablet by mouth at bedtime.   sildenafil (VIAGRA) 100 MG tablet Take 100 mg by mouth daily as needed for erectile dysfunction.   torsemide (DEMADEX) 20 MG tablet Take 2 tablets (40 mg total) by mouth daily. (Patient taking differently: Take 20 mg by mouth daily.)   traMADol (ULTRAM) 50 MG tablet Take 1 tablet (50 mg total) by mouth every 6 (six) hours as needed for moderate pain.   VITAMIN B COMPLEX-C PO Take 1 tablet by mouth at bedtime.     Allergies:   Patient has no known allergies.   Social History   Socioeconomic History   Marital status: Married    Spouse name: Not on file   Number of children: 1   Years of education: Not on file   Highest education level: Not on file  Occupational History   Occupation: retired  Tobacco Use   Smoking status: Former    Current packs/day: 0.00    Average packs/day: 1 pack/day for 42.0 years (42.0 ttl pk-yrs)    Types: Cigarettes    Start date: 06/20/1969    Quit date: 06/21/2011    Years since quitting: 12.1   Smokeless tobacco: Former    Types: Engineer, drilling   Vaping status: Never Used  Substance and Sexual Activity   Alcohol use: Yes     Alcohol/week: 0.0 standard drinks of alcohol    Comment: social   Drug use: No   Sexual activity: Not on file  Other Topics Concern   Not on file  Social History Narrative   Lives with wife and mother in law.    Social Determinants of Health   Financial Resource Strain: Not on file  Food Insecurity: No Food Insecurity (04/25/2023)   Hunger Vital Sign    Worried About Running Out of Food in the Last Year: Never true    Ran Out of Food in the Last Year: Never true  Transportation Needs: No Transportation Needs (04/25/2023)   PRAPARE - Administrator, Civil Service (Medical): No    Lack of Transportation (Non-Medical): No  Physical Activity: Not on file  Stress: Not on file  Social Connections: Not on file     Family History: The patient's family history includes CAD (age of onset: 53) in his sister; CAD (age of onset: 58) in his brother and sister; Cancer in his brother; Liver disease in an other family member; Sudden death (age of onset: 70) in his mother. There is no history of Colon cancer, Colon polyps, Esophageal cancer, Rectal cancer, or Stomach cancer.  ROS:   Please see the history of present illness.     All other systems reviewed and are negative.  EKGs/Labs/Other Studies Reviewed:    The following studies were reviewed today:  Cardiac Studies & Procedures   CARDIAC CATHETERIZATION  CARDIAC CATHETERIZATION 04/22/2023  Narrative   Ost RCA lesion is 70% stenosed.   Prox RCA lesion is 50% stenosed.   Mid RCA lesion is 70% stenosed.   Mid LM to Dist LM lesion is 90% stenosed.   Ost Cx to Prox Cx lesion is 90% stenosed.   Mid Cx lesion is 80% stenosed.   Dist LM to Prox LAD lesion is 99% stenosed.   Mid  LAD lesion is 80% stenosed.  Severe distal left main stenosis Severe ostial LAD stenosis. Severe mid LAD stenosis. The distal LAD fills from antegrade flow and from right to left collaterals. Severe ostial Circumflex stenosis. Severe stenosis mid  Circumflex Large dominant RCA with moderate ostial stenosis, patent mid stented segment with moderate in stent restenosis and moderately severe mid stenosis beyond the stent (RFR 0.90 today with pressure wire analysis suggesting the disease in the RCA is borderline but likely severe enough that a bypass graft would remain patent to the distal RCA). Normal LV filling pressures.  Recommendations: Will admit to telemetry today. Will ask CT surgery to see him to discuss CABG. He has been stable at home. Will not start IV heparin today. Will continue ASA, statin and beta blocker.  Findings Coronary Findings Diagnostic  Dominance: Right  Left Main Mid LM to Dist LM lesion is 90% stenosed. Dist LM to Prox LAD lesion is 99% stenosed.  Left Anterior Descending Vessel is large. Collaterals Dist LAD filled by collaterals from RPDA.  Mid LAD lesion is 80% stenosed.  Left Circumflex Vessel is large. Ost Cx to Prox Cx lesion is 90% stenosed. Mid Cx lesion is 80% stenosed.  Right Coronary Artery Vessel is large. Ost RCA lesion is 70% stenosed. Prox RCA lesion is 50% stenosed. The lesion was previously treated using a bare metal stent over 2 years ago. Mid RCA lesion is 70% stenosed.  Intervention  No interventions have been documented.   STRESS TESTS  NM PET CT CARDIAC PERFUSION MULTI W/ABSOLUTE BLOODFLOW 04/20/2023  Narrative   Findings are consistent with ischemia. The study is high risk.   Findings in combination that are high risk include decrease in EF with stress, TID, large perfusion defect, no augmentation of CFR with stress. Recommend cardiac catheterization if clinically appropriate. Critical findings discussed with Dr. Antoine Poche 04/20/23 at 15:10 and acknowledged.   LV perfusion is abnormal. There is evidence of ischemia. There is no evidence of infarction. Defect 1: There is a large defect with severe reduction in uptake present in the apical to basal location(s) that is  reversible, primarily involving the septal, anterior and lateral walls from apex to mid, and the anteroseptal and inferolateral walls at the base. There is abnormal wall motion in the defect area. Consistent with ischemia.   Rest left ventricular function is abnormal. Rest global function is mildly reduced. Rest EF: 48 %. Stress left ventricular function is abnormal. Stress global function is mildly reduced. Stress EF: 44 %. End diastolic cavity size is moderately enlarged. End systolic cavity size is moderately enlarged. Evidence of transient ischemic dilation (TID) noted. TID was appreciated visually and quantitatively.   Myocardial blood flow was computed to be 0.69ml/g/min at rest and 0.62ml/g/min at stress. Global myocardial blood flow reserve was 0.68 and was highly abnormal.   Coronary calcium assessment not performed due to prior revascularization.   Electronically signed by: Parke Poisson, MD  CLINICAL DATA:  This over-read does not include interpretation of cardiac or coronary anatomy or pathology. The Cardiac PET CT interpretation by the cardiologist is attached.  COMPARISON:  Chest CT dated September 04, 2022  FINDINGS: Vascular: Cardiomegaly. Normal caliber thoracic aorta with moderate calcified plaque. Severe left main and three-vessel coronary artery calcifications.  Mediastinum/Nodes: Esophagus is unremarkable. No enlarged lymph nodes seen in the chest.  Lungs/Pleura: Prior right upper lobectomy. Stable partially visualized posttreatment changes of the right hemithorax.  Upper Abdomen: Postsurgical changes of the stomach. No acute abnormality.  Musculoskeletal: No acute osseous abnormality.  IMPRESSION: No acute extracardiac abnormality.   Electronically Signed By: Allegra Lai M.D. On: 04/20/2023 09:02   ECHOCARDIOGRAM  ECHOCARDIOGRAM COMPLETE 03/25/2023  Narrative ECHOCARDIOGRAM REPORT    Patient Name:   Daniel Colon Date of Exam:  03/25/2023 Medical Rec #:  [id number]       Height:       68.5 in Accession #:    [id number]      Weight:       193.0 lb Date of Birth:  03/30/1946       BSA:          2.024 m Patient Age:    76 years        BP:           132/60 mmHg Patient Gender: M               HR:           65 bpm. Exam Location:  [address]  Procedure: 2D Echo, Cardiac Doppler, Color Doppler, 3D Echo and Strain Analysis  Indications:    I25.10 Coronary artery disease;  History:        Patient has prior history of Echocardiogram examinations, most recent 11/11/2020. Previous Myocardial Infarction, Signs/Symptoms:Shortness of Breath; Risk Factors:Hypertension and Dyslipidemia.  Sonographer:    Cathie Beams RCS Referring Phys: [address] [place]  IMPRESSIONS   1. Left ventricular ejection fraction, by estimation, is 55 to 60%. The left ventricle has normal function. The left ventricle has no regional wall motion abnormalities. There is mild concentric left ventricular hypertrophy. Left ventricular diastolic parameters were normal. 2. Right ventricular systolic function is normal. The right ventricular size is normal. 3. Left atrial size was mildly dilated. 4. Right atrial size was mildly dilated. 5. The mitral valve is normal in structure. Trivial mitral valve regurgitation. No evidence of mitral stenosis. 6. The aortic valve is tricuspid. There is mild calcification of the aortic valve. Aortic valve regurgitation is trivial. No aortic stenosis is present. 7. Aortic dilatation noted. There is borderline dilatation of the ascending aorta, measuring 38 mm. 8. The inferior vena cava is normal in size with greater than 50% respiratory variability, suggesting right atrial pressure of 3 mmHg.  FINDINGS Left Ventricle: Left ventricular ejection fraction, by estimation, is 55 to 60%. The left ventricle has normal function. The left ventricle has no regional wall motion abnormalities. The left ventricular internal  cavity size was normal in size. There is mild concentric left ventricular hypertrophy. Left ventricular diastolic parameters were normal.  Right Ventricle: The right ventricular size is normal. No increase in right ventricular wall thickness. Right ventricular systolic function is normal.  Left Atrium: Left atrial size was mildly dilated.  Right Atrium: Right atrial size was mildly dilated.  Pericardium: There is no evidence of pericardial effusion.  Mitral Valve: The mitral valve is normal in structure. Trivial mitral valve regurgitation. No evidence of mitral valve stenosis.  Tricuspid Valve: The tricuspid valve is normal in structure. Tricuspid valve regurgitation is trivial. No evidence of tricuspid stenosis.  Aortic Valve: The aortic valve is tricuspid. There is mild calcification of the aortic valve. Aortic valve regurgitation is trivial. No aortic stenosis is present.  Pulmonic Valve: The pulmonic valve was normal in structure. Pulmonic valve regurgitation is not visualized. No evidence of pulmonic stenosis.  Aorta: Aortic dilatation noted. There is borderline dilatation of the ascending aorta, measuring 38 mm.  Venous: The inferior vena cava is normal in  size with greater than 50% respiratory variability, suggesting right atrial pressure of 3 mmHg.  IAS/Shunts: No atrial level shunt detected by color flow Doppler.   LEFT VENTRICLE PLAX 2D LVIDd:         4.80 cm   Diastology LVIDs:         3.40 cm   LV e' medial:    7.51 cm/s LV PW:         1.30 cm   LV E/e' medial:  12.6 LV IVS:        1.10 cm   LV e' lateral:   13.10 cm/s LVOT diam:     2.20 cm   LV E/e' lateral: 7.2 LV SV:         103 LV SV Index:   51 LVOT Area:     3.80 cm  3D Volume EF: 3D EF:        55 % LV EDV:       170 ml LV ESV:       76 ml LV SV:        94 ml  RIGHT VENTRICLE RV Basal diam:  3.70 cm RV S prime:     14.30 cm/s TAPSE (M-mode): 2.1 cm RVSP:           22.9 mmHg  LEFT ATRIUM              Index        RIGHT ATRIUM           Index LA diam:        4.90 cm 2.42 cm/m   RA Pressure: 3.00 mmHg LA Vol (A2C):   88.5 ml 43.73 ml/m  RA Area:     19.50 cm LA Vol (A4C):   63.3 ml 31.27 ml/m  RA Volume:   54.70 ml  27.03 ml/m LA Biplane Vol: 75.0 ml 37.06 ml/m AORTIC VALVE LVOT Vmax:   110.00 cm/s LVOT Vmean:  75.600 cm/s LVOT VTI:    0.271 m  AORTA Ao Root diam: 3.50 cm Ao Asc diam:  3.80 cm  MITRAL VALVE               TRICUSPID VALVE MV Area (PHT): 4.63 cm    TR Peak grad:   19.9 mmHg MV Decel Time: 164 msec    TR Vmax:        223.00 cm/s MV E velocity: 94.60 cm/s  Estimated RAP:  3.00 mmHg MV A velocity: 76.60 cm/s  RVSP:           22.9 mmHg MV E/A ratio:  1.23 SHUNTS Systemic VTI:  0.27 m Systemic Diam: 2.20 cm  Arvilla Meres MD Electronically signed by Arvilla Meres MD Signature Date/Time: 03/25/2023/2:14:38 PM    Final   TEE  ECHO INTRAOPERATIVE TEE 04/27/2023  Narrative *INTRAOPERATIVE TRANSESOPHAGEAL REPORT *    Patient Name:   Daniel Colon Date of Exam: 04/27/2023 Medical Rec #:  [id number]       Height:       68.5 in Accession #:    [id number]      Weight:       191.0 lb Date of Birth:  March 16, 1946       BSA:          2.02 m Patient Age:    76 years        BP:           95/20 mmHg Patient Gender: M  HR:           49 bpm. Exam Location:  Anesthesiology  Transesophogeal exam was perform intraoperatively during surgical procedure. Patient was closely monitored under general anesthesia during the entirety of examination.  Indications:     CABG Sonographer:     Milda Smart Performing Phys: [id number] Eliezer Lofts LIGHTFOOT Diagnosing Phys: Heather Roberts MD  Complications: No known complications during this procedure. POST-OP IMPRESSIONS _ Left Ventricle: The left ventricle is unchanged from pre-bypass. _ Right Ventricle: The right ventricle appears unchanged from pre-bypass. _ Aorta: The aorta appears unchanged from  pre-bypass. _ Left Atrium: The left atrium appears unchanged from pre-bypass. _ Left Atrial Appendage: The left atrial appendage appears unchanged from pre-bypass. _ Aortic Valve: The aortic valve appears unchanged from pre-bypass. _ Mitral Valve: The mitral valve appears unchanged from pre-bypass. _ Tricuspid Valve: The tricuspid valve appears unchanged from pre-bypass. _ Pulmonic Valve: The pulmonic valve appears unchanged from pre-bypass. _ Interatrial Septum: The interatrial septum appears unchanged from pre-bypass. _ Interventricular Septum: The interventricular septum appears unchanged from pre-bypass. _ Pericardium: The pericardium appears unchanged from pre-bypass. _ Comments: Good LVFx post bypass, exam otherwise exam unchanged.  PRE-OP FINDINGS Left Ventricle: The left ventricle has normal systolic function, with an ejection fraction of 55-60%. The cavity size was normal. No evidence of left ventricular regional wall motion abnormalities. There is moderate left ventricular hypertrophy.   Right Ventricle: The right ventricle has normal systolic function. The cavity was normal. There is no increase in right ventricular wall thickness.  Left Atrium: Left atrial size was normal in size. No left atrial/left atrial appendage thrombus was detected.  Right Atrium: Right atrial size was normal in size.  Interatrial Septum: Evidence of atrial level shunting detected by color flow Doppler. A small patent foramen ovale is detected.  Pericardium: There is no evidence of pericardial effusion.  Mitral Valve: The mitral valve is normal in structure. Mitral valve regurgitation is trivial by color flow Doppler.  Tricuspid Valve: The tricuspid valve was normal in structure. Tricuspid valve regurgitation is trivial by color flow Doppler.  Aortic Valve: The aortic valve is normal in structure. Aortic valve regurgitation is trivial by color flow Doppler. There is no stenosis of the aortic  valve.   Pulmonic Valve: The pulmonic valve was normal in structure. Pulmonic valve regurgitation is trivial by color flow Doppler.   Aorta: The aortic arch was not well visualized. There is moderate dilatation of the ascending aorta, measuring 37 mm. There is evidence of plaque in the descending aorta; Grade II, measuring 2-72mm in size.   Heather Roberts MD Electronically signed by Heather Roberts MD Signature Date/Time: 04/27/2023/2:02:48 PM    Final             EKG Interpretation Date/Time:  Tuesday August 03 2023 09:24:00 EDT Ventricular Rate:  62 PR Interval:  216 QRS Duration:  98 QT Interval:  428 QTC Calculation: 434 R Axis:   91  Text Interpretation: Sinus rhythm with 1st degree A-V block Rightward axis Cannot rule out Anterior infarct (cited on or before 25-May-2023) ST & T wave abnormality, consider inferolateral ischemia When compared with ECG of 23-Jun-2023 08:40, Questionable change in initial forces of Septal leads Confirmed by Micah Flesher ([id number]) on 08/03/2023 10:09:30 AM    Recent Labs: 05/25/2023: ALT 61; Hemoglobin 11.3; Platelets 255 07/01/2023: BUN 18; Creatinine, Ser 1.00; Magnesium 2.3; Potassium 3.9; Sodium 144  Recent Lipid Panel No results found for: "CHOL", "TRIG", "HDL", "CHOLHDL", "VLDL", "LDLCALC", "  LDLDIRECT"   Risk Assessment/Calculations:                Physical Exam:    VS:  BP 130/63   Pulse 62   Ht 5\' 8"  (1.727 m)   Wt 181 lb 12.8 oz (82.5 kg)   SpO2 98%   BMI 27.64 kg/m     Wt Readings from Last 3 Encounters:  08/03/23 181 lb 12.8 oz (82.5 kg)  06/23/23 177 lb (80.3 kg)  06/15/23 181 lb 9.6 oz (82.4 kg)     GEN:  Well nourished, well developed in no acute distress HEENT: Normal NECK: No JVD; No carotid bruits LYMPHATICS: No lymphadenopathy CARDIAC: RRR, no murmurs, rubs, gallops RESPIRATORY:  Clear to auscultation without rales, wheezing or rhonchi  ABDOMEN: Soft, non-tender, non-distended MUSCULOSKELETAL:  mild LE edema;  No deformity  SKIN: Warm and dry NEUROLOGIC:  Alert and oriented x 3 PSYCHIATRIC:  Normal affect   ASSESSMENT:    1. Essential hypertension   2. Dyspnea on exertion   3. Coronary artery disease involving native coronary artery of native heart without angina pectoris   4. S/P CABG x 4   5. Primary hypertension   6. Hyperlipidemia with target LDL less than 70   7. Preoperative cardiovascular examination    PLAN:    In order of problems listed above:  Moderate LVH/diastolic heart failure Maintained on 20 mg torsemide daily with 10 mEq potassium May need to transition to SGLT2 inhibitor versus MR - will do this after surgery   CAD s/p CABG x 4 04/2023 Continue aspirin, statin   Hypertension - Lopressor 25 mg twice daily   Hyperlipidemia with LDL goal less than 70 LDL 53  January 2024, continue Lipitor and Zetia   Preoperative risk evaluation for Mace prior to hip surgery Given his CAD and diastolic heart failure requiring diuretic, he has a 6.6% risk of Mace.  He is now near euvolemic.  He understands his risk and wishes to proceed. I do not think he has any unstable cardiac conditions. Will need to cautious with IVF during and after surgery. No further cardiovascular evaluation prior to surgery. Given recent CABG, we prefer to continue ASA throughout the perioperative period.   Dr. Lequita Halt Monia Pouch [phone]     Cardiac Rehabilitation Eligibility Assessment  The patient is ready to start cardiac rehabilitation from a cardiac standpoint.          Medication Adjustments/Labs and Tests Ordered: Current medicines are reviewed at length with the patient today.  Concerns regarding medicines are outlined above.  Orders Placed This Encounter  Procedures   EKG 12-Lead   No orders of the defined types were placed in this encounter.   Patient Instructions  Medication Instructions:  Your physician recommends that you continue on your current medications as  directed. Please refer to the Current Medication list given to you today.  *If you need a refill on your cardiac medications before your next appointment, please call your pharmacy*   Follow-Up: At [facility], you and your health needs are our priority.  As part of our continuing mission to provide you with exceptional heart care, we have created designated Provider Care Teams.  These Care Teams include your primary Cardiologist (physician) and Advanced Practice Providers (APPs -  Physician Assistants and Nurse Practitioners) who all work together to provide you with the care you need, when you need it.  We recommend signing up for the patient portal called "MyChart".  Sign  up information is provided on this After Visit Summary.  MyChart is used to connect with patients for Virtual Visits (Telemedicine).  Patients are able to view lab/test results, encounter notes, upcoming appointments, etc.  Non-urgent messages can be sent to your provider as well.   To learn more about what you can do with MyChart, go to [email or web address].    Your next appointment:   September 18th at 9:40am  Provider:   Rollene Rotunda, MD      Signed, Roe Rutherford [place], [place]  08/03/2023 10:10 AM    [facility]

## 2023-07-30 NOTE — Patient Instructions (Signed)
DUE TO COVID-19 ONLY TWO VISITORS  (aged 75 and older)  ARE ALLOWED TO COME WITH YOU AND STAY IN THE WAITING ROOM ONLY DURING PRE OP AND PROCEDURE.   **NO VISITORS ARE ALLOWED IN THE SHORT STAY AREA OR RECOVERY ROOM!!**  IF YOU WILL BE ADMITTED INTO THE HOSPITAL YOU ARE ALLOWED ONLY FOUR SUPPORT PEOPLE DURING VISITATION HOURS ONLY (7 AM -8PM)   The support person(s) must pass our screening, gel in and out, and wear a mask at all times, including in the patient's room. Patients must also wear a mask when staff or their support person are in the room. Visitors GUEST BADGE MUST BE WORN VISIBLY  One adult visitor may remain with you overnight and MUST be in the room by 8 P.M.     Your procedure is scheduled on: 08/09/23   Report to [facility] Main Entrance    Report to admitting at : 10:30 AM   Call this number if you have problems the morning of surgery [phone]   Do not eat food :After Midnight.   After Midnight you may have the following liquids until: 10:00 AM DAY OF SURGERY  Water Black Coffee (sugar ok, NO MILK/CREAM OR CREAMERS)  Tea (sugar ok, NO MILK/CREAM OR CREAMERS) regular and decaf                             Plain Jell-O (NO RED)                                           Fruit ices (not with fruit pulp, NO RED)                                     Popsicles (NO RED)                                                                  Juice: apple, WHITE grape, WHITE cranberry Sports drinks like Gatorade (NO RED)   The day of surgery:  Drink ONE (1) Pre-Surgery Clear Ensure at : 10:00 AM the morning of surgery. Drink in one sitting. Do not sip.  This drink was given to you during your hospital  pre-op appointment visit. Nothing else to drink after completing the  Pre-Surgery Clear Ensure or G2.          If you have questions, please contact your surgeon's office.  FOLLOW ANY ADDITIONAL PRE OP INSTRUCTIONS YOU RECEIVED FROM YOUR SURGEON'S OFFICE!!!  Oral  Hygiene is also important to reduce your risk of infection.                                    Remember - BRUSH YOUR TEETH THE MORNING OF SURGERY WITH YOUR REGULAR TOOTHPASTE  DENTURES WILL BE REMOVED PRIOR TO SURGERY PLEASE DO NOT APPLY "Poly grip" OR ADHESIVES!!!   Do NOT smoke after Midnight   Take these medicines the morning of surgery with A  SIP OF WATER: N/A. Tylenol as needed.Inhalers as usual.                              You may not have any metal on your body including hair pins, jewelry, and body piercing             Do not wear lotions, powders, perfumes/cologne, or deodorant              Men may shave face and neck.   Do not bring valuables to the hospital. [facility] IS NOT             RESPONSIBLE   FOR VALUABLES.   Contacts, glasses, or bridgework may not be worn into surgery.   Bring small overnight bag day of surgery.   DO NOT BRING YOUR HOME MEDICATIONS TO THE HOSPITAL. PHARMACY WILL DISPENSE MEDICATIONS LISTED ON YOUR MEDICATION LIST TO YOU DURING YOUR ADMISSION IN THE HOSPITAL!    Patients discharged on the day of surgery will not be allowed to drive home.  Someone NEEDS to stay with you for the first 24 hours after anesthesia.   Special Instructions: Bring a copy of your healthcare power of attorney and living will documents         the day of surgery if you haven't scanned them before.              Please read over the following fact sheets you were given: IF YOU HAVE QUESTIONS ABOUT YOUR PRE-OP INSTRUCTIONS PLEASE CALL [phone]      Pre-operative 5 CHG Bath Instructions   You can play a key role in reducing the risk of infection after surgery. Your skin needs to be as free of germs as possible. You can reduce the number of germs on your skin by washing with CHG (chlorhexidine gluconate) soap before surgery. CHG is an antiseptic soap that kills germs and continues to kill germs even after washing.   DO NOT use if you have an allergy to chlorhexidine/CHG  or antibacterial soaps. If your skin becomes reddened or irritated, stop using the CHG and notify one of our RNs at : [phone].   Please shower with the CHG soap starting 4 days before surgery using the following schedule:     Please keep in mind the following:  DO NOT shave, including legs and underarms, starting the day of your first shower.   You may shave your face at any point before/day of surgery.  Place clean sheets on your bed the day you start using CHG soap. Use a clean washcloth (not used since being washed) for each shower. DO NOT sleep with pets once you start using the CHG.   CHG Shower Instructions:  If you choose to wash your hair and private area, wash first with your normal shampoo/soap.  After you use shampoo/soap, rinse your hair and body thoroughly to remove shampoo/soap residue.  Turn the water OFF and apply about 3 tablespoons (45 ml) of CHG soap to a CLEAN washcloth.  Apply CHG soap ONLY FROM YOUR NECK DOWN TO YOUR TOES (washing for 3-5 minutes)  DO NOT use CHG soap on face, private areas, open wounds, or sores.  Pay special attention to the area where your surgery is being performed.  If you are having back surgery, having someone wash your back for you may be helpful. Wait 2 minutes after CHG soap is applied, then you  may rinse off the CHG soap.  Pat dry with a clean towel  Put on clean clothes/pajamas   If you choose to wear lotion, please use ONLY the CHG-compatible lotions on the back of this paper.     Additional instructions for the day of surgery: DO NOT APPLY any lotions, deodorants, cologne, or perfumes.   Put on clean/comfortable clothes.  Brush your teeth.  Ask your nurse before applying any prescription medications to the skin.      CHG Compatible Lotions   Aveeno Moisturizing lotion  Cetaphil Moisturizing Cream  Cetaphil Moisturizing Lotion  Clairol Herbal Essence Moisturizing Lotion, Dry Skin  Clairol Herbal Essence Moisturizing  Lotion, Extra Dry Skin  Clairol Herbal Essence Moisturizing Lotion, Normal Skin  Curel Age Defying Therapeutic Moisturizing Lotion with Alpha Hydroxy  Curel Extreme Care Body Lotion  Curel Soothing Hands Moisturizing Hand Lotion  Curel Therapeutic Moisturizing Cream, Fragrance-Free  Curel Therapeutic Moisturizing Lotion, Fragrance-Free  Curel Therapeutic Moisturizing Lotion, Original Formula  Eucerin Daily Replenishing Lotion  Eucerin Dry Skin Therapy Plus Alpha Hydroxy Crme  Eucerin Dry Skin Therapy Plus Alpha Hydroxy Lotion  Eucerin Original Crme  Eucerin Original Lotion  Eucerin Plus Crme Eucerin Plus Lotion  Eucerin TriLipid Replenishing Lotion  Keri Anti-Bacterial Hand Lotion  Keri Deep Conditioning Original Lotion Dry Skin Formula Softly Scented  Keri Deep Conditioning Original Lotion, Fragrance Free Sensitive Skin Formula  Keri Lotion Fast Absorbing Fragrance Free Sensitive Skin Formula  Keri Lotion Fast Absorbing Softly Scented Dry Skin Formula  Keri Original Lotion  Keri Skin Renewal Lotion Keri Silky Smooth Lotion  Keri Silky Smooth Sensitive Skin Lotion  Nivea Body Creamy Conditioning Oil  Nivea Body Extra Enriched Lotion  Nivea Body Original Lotion  Nivea Body Sheer Moisturizing Lotion Nivea Crme  Nivea Skin Firming Lotion  NutraDerm 30 Skin Lotion  NutraDerm Skin Lotion  NutraDerm Therapeutic Skin Cream  NutraDerm Therapeutic Skin Lotion  ProShield Protective Hand Cream  Provon moisturizing lotion   Incentive Spirometer  An incentive spirometer is a tool that can help keep your lungs clear and active. This tool measures how well you are filling your lungs with each breath. Taking long deep breaths may help reverse or decrease the chance of developing breathing (pulmonary) problems (especially infection) following: A long period of time when you are unable to move or be active. BEFORE THE PROCEDURE  If the spirometer includes an indicator to show your best  effort, your nurse or respiratory therapist will set it to a desired goal. If possible, sit up straight or lean slightly forward. Try not to slouch. Hold the incentive spirometer in an upright position. INSTRUCTIONS FOR USE  Sit on the edge of your bed if possible, or sit up as far as you can in bed or on a chair. Hold the incentive spirometer in an upright position. Breathe out normally. Place the mouthpiece in your mouth and seal your lips tightly around it. Breathe in slowly and as deeply as possible, raising the piston or the ball toward the top of the column. Hold your breath for 3-5 seconds or for as long as possible. Allow the piston or ball to fall to the bottom of the column. Remove the mouthpiece from your mouth and breathe out normally. Rest for a few seconds and repeat Steps 1 through 7 at least 10 times every 1-2 hours when you are awake. Take your time and take a few normal breaths between deep breaths. The spirometer may include an indicator to show  your best effort. Use the indicator as a goal to work toward during each repetition. After each set of 10 deep breaths, practice coughing to be sure your lungs are clear. If you have an incision (the cut made at the time of surgery), support your incision when coughing by placing a pillow or rolled up towels firmly against it. Once you are able to get out of bed, walk around indoors and cough well. You may stop using the incentive spirometer when instructed by your caregiver.  RISKS AND COMPLICATIONS Take your time so you do not get dizzy or light-headed. If you are in pain, you may need to take or ask for pain medication before doing incentive spirometry. It is harder to take a deep breath if you are having pain. AFTER USE Rest and breathe slowly and easily. It can be helpful to keep track of a log of your progress. Your caregiver can provide you with a simple table to help with this. If you are using the spirometer at home, follow  these instructions: SEEK MEDICAL CARE IF:  You are having difficultly using the spirometer. You have trouble using the spirometer as often as instructed. Your pain medication is not giving enough relief while using the spirometer. You develop fever of 100.5 F (38.1 C) or higher. SEEK IMMEDIATE MEDICAL CARE IF:  You cough up bloody sputum that had not been present before. You develop fever of 102 F (38.9 C) or greater. You develop worsening pain at or near the incision site. MAKE SURE YOU:  Understand these instructions. Will watch your condition. Will get help right away if you are not doing well or get worse. Document Released: 04/19/2007 Document Revised: 02/29/2012 Document Reviewed: 06/20/2007 [facility] Patient Information 2014 [place], [place].   ________________________________________________________________________

## 2023-08-03 ENCOUNTER — Ambulatory Visit (INDEPENDENT_AMBULATORY_CARE_PROVIDER_SITE_OTHER): Payer: Medicare Other | Admitting: Physician Assistant

## 2023-08-03 ENCOUNTER — Encounter (HOSPITAL_COMMUNITY): Payer: Self-pay

## 2023-08-03 ENCOUNTER — Encounter: Payer: Self-pay | Admitting: Physician Assistant

## 2023-08-03 ENCOUNTER — Other Ambulatory Visit: Payer: Self-pay

## 2023-08-03 ENCOUNTER — Encounter (HOSPITAL_COMMUNITY)
Admission: RE | Admit: 2023-08-03 | Discharge: 2023-08-03 | Disposition: A | Discharge status: Home / Self Care | Payer: Medicare Other | Source: Ambulatory Visit | Attending: Orthopedic Surgery | Admitting: Orthopedic Surgery

## 2023-08-03 VITALS — BP 149/67 | HR 63 | Temp 97.7°F | Ht 68.0 in | Wt 179.0 lb

## 2023-08-03 VITALS — BP 130/63 | HR 62 | Ht 68.0 in | Wt 181.8 lb

## 2023-08-03 DIAGNOSIS — I10 Essential (primary) hypertension: Secondary | ICD-10-CM | POA: Diagnosis not present

## 2023-08-03 DIAGNOSIS — Z6827 Body mass index (BMI) 27.0-27.9, adult: Secondary | ICD-10-CM | POA: Insufficient documentation

## 2023-08-03 DIAGNOSIS — I252 Old myocardial infarction: Secondary | ICD-10-CM | POA: Diagnosis not present

## 2023-08-03 DIAGNOSIS — I2510 Atherosclerotic heart disease of native coronary artery without angina pectoris: Secondary | ICD-10-CM | POA: Diagnosis not present

## 2023-08-03 DIAGNOSIS — E669 Obesity, unspecified: Secondary | ICD-10-CM | POA: Insufficient documentation

## 2023-08-03 DIAGNOSIS — J90 Pleural effusion, not elsewhere classified: Secondary | ICD-10-CM | POA: Diagnosis not present

## 2023-08-03 DIAGNOSIS — E785 Hyperlipidemia, unspecified: Secondary | ICD-10-CM | POA: Insufficient documentation

## 2023-08-03 DIAGNOSIS — Z01818 Encounter for other preprocedural examination: Secondary | ICD-10-CM

## 2023-08-03 DIAGNOSIS — R0609 Other forms of dyspnea: Secondary | ICD-10-CM | POA: Diagnosis not present

## 2023-08-03 DIAGNOSIS — Z01810 Encounter for preprocedural cardiovascular examination: Secondary | ICD-10-CM | POA: Insufficient documentation

## 2023-08-03 DIAGNOSIS — Z87891 Personal history of nicotine dependence: Secondary | ICD-10-CM | POA: Insufficient documentation

## 2023-08-03 DIAGNOSIS — Z951 Presence of aortocoronary bypass graft: Secondary | ICD-10-CM | POA: Diagnosis not present

## 2023-08-03 DIAGNOSIS — K219 Gastro-esophageal reflux disease without esophagitis: Secondary | ICD-10-CM | POA: Diagnosis not present

## 2023-08-03 DIAGNOSIS — Z01812 Encounter for preprocedural laboratory examination: Secondary | ICD-10-CM | POA: Insufficient documentation

## 2023-08-03 DIAGNOSIS — M1611 Unilateral primary osteoarthritis, right hip: Secondary | ICD-10-CM | POA: Diagnosis not present

## 2023-08-03 LAB — BASIC METABOLIC PANEL
Anion gap: 7 (ref 5–15)
BUN: 20 mg/dL (ref 8–23)
CO2: 25 mmol/L (ref 22–32)
Calcium: 8.8 mg/dL — ABNORMAL LOW (ref 8.9–10.3)
Chloride: 107 mmol/L (ref 98–111)
Creatinine, Ser: 0.91 mg/dL (ref 0.61–1.24)
GFR, Estimated: >60 mL/min (ref >60)
Glucose, Bld: 102 mg/dL — ABNORMAL HIGH (ref 70–99)
Potassium: 3.8 mmol/L (ref 3.5–5.1)
Sodium: 139 mmol/L (ref 135–145)

## 2023-08-03 LAB — CBC
HCT: 38.4 % — ABNORMAL LOW (ref 39.0–52.0)
Hemoglobin: 11.5 g/dL — ABNORMAL LOW (ref 13.0–17.0)
MCH: 25.4 pg — ABNORMAL LOW (ref 26.0–34.0)
MCHC: 29.9 g/dL — ABNORMAL LOW (ref 30.0–36.0)
MCV: 84.8 fL (ref 80.0–100.0)
Platelets: 180 K/uL (ref 150–400)
RBC: 4.53 MIL/uL (ref 4.22–5.81)
RDW: 17.2 % — ABNORMAL HIGH (ref 11.5–15.5)
WBC: 7.4 K/uL (ref 4.0–10.5)
nRBC: 0 % (ref 0.0–0.2)

## 2023-08-03 LAB — TYPE AND SCREEN
ABO/RH(D): A NEG
Antibody Screen: NEGATIVE

## 2023-08-03 LAB — SURGICAL PCR SCREEN
MRSA, PCR: NEGATIVE
Staphylococcus aureus: NEGATIVE

## 2023-08-03 NOTE — Progress Notes (Signed)
For Short Stay: COVID SWAB appointment date:  Bowel Prep reminder:   For Anesthesia: PCP - Dr. Jarome Matin Cardiologist - Dr. Rollene Rotunda. LOV 02/2023 Clearance: Marylene Land Duke: PA: 08/03/23  Chest x-ray - 05/25/23 EKG - 08/03/23 Stress Test -  ECHO - 04/27/23 Cardiac Cath - 04/22/23 Pacemaker/ICD device last checked: Pacemaker orders received: Device Rep notified:  Spinal Cord Stimulator: N/A  Sleep Study - Yes CPAP - NO  Fasting Blood Sugar - N/A Checks Blood Sugar _____ times a day Date and result of last Hgb A1c-  Last dose of GLP1 agonist- N/A GLP1 instructions:   Last dose of SGLT-2 inhibitors- N/A SGLT-2 instructions:   Blood Thinner Instructions: Aspirin Instructions: As per cardiologist it should continue. Last Dose:  Activity level: Can go up a flight of stairs and activities of daily living without stopping and without chest pain and/or shortness of breath   Able to exercise without chest pain and/or shortness of breath   Unable to go up a flight of stairs without shortness of breath    Anesthesia review: Hx: MI,CAD,CABG x 4,HTN,OSA(NO CPAP),Chronic SOB.  Patient denies shortness of breath, fever, cough and chest pain at PAT appointment   Patient verbalized understanding of instructions that were given to them at the PAT appointment. Patient was also instructed that they will need to review over the PAT instructions again at home before surgery.

## 2023-08-03 NOTE — Patient Instructions (Signed)
Medication Instructions:  Your physician recommends that you continue on your current medications as directed. Please refer to the Current Medication list given to you today.  *If you need a refill on your cardiac medications before your next appointment, please call your pharmacy*   Follow-Up: At [facility], you and your health needs are our priority.  As part of our continuing mission to provide you with exceptional heart care, we have created designated Provider Care Teams.  These Care Teams include your primary Cardiologist (physician) and Advanced Practice Providers (APPs -  Physician Assistants and Nurse Practitioners) who all work together to provide you with the care you need, when you need it.  We recommend signing up for the patient portal called "MyChart".  Sign up information is provided on this After Visit Summary.  MyChart is used to connect with patients for Virtual Visits (Telemedicine).  Patients are able to view lab/test results, encounter notes, upcoming appointments, etc.  Non-urgent messages can be sent to your provider as well.   To learn more about what you can do with MyChart, go to [email or web address].    Your next appointment:   September 18th at 9:40am  Provider:   Rollene Rotunda, MD

## 2023-08-04 ENCOUNTER — Encounter (HOSPITAL_COMMUNITY): Payer: Self-pay

## 2023-08-04 NOTE — Anesthesia Preprocedure Evaluation (Addendum)
Anesthesia Evaluation  Patient identified by MRN, date of birth, ID band Patient awake    Reviewed: Allergy & Precautions, NPO status , Patient's Chart, lab work & pertinent test results  Airway Mallampati: II  TM Distance: >3 FB Neck ROM: Full    Dental  (+) Teeth Intact, Dental Advisory Given   Pulmonary COPD,  COPD inhaler, former smoker    + decreased breath sounds      Cardiovascular hypertension, + CAD, + Past MI, + Cardiac Stents and + CABG   Rhythm:Regular Rate:Normal  Echo: Left Ventricle: The left ventricle has normal systolic function, with an  ejection fraction of 55-60%. The cavity size was normal. No evidence of  left ventricular regional wall motion abnormalities. There is moderate  left ventricular hypertrophy.     Neuro/Psych negative neurological ROS  negative psych ROS   GI/Hepatic Neg liver ROS, PUD,GERD  ,,  Endo/Other  negative endocrine ROS    Renal/GU negative Renal ROS     Musculoskeletal  (+) Arthritis ,    Abdominal   Peds  Hematology negative hematology ROS (+)   Anesthesia Other Findings   Reproductive/Obstetrics                             Anesthesia Physical Anesthesia Plan  ASA: 3  Anesthesia Plan: Spinal   Post-op Pain Management: Tylenol PO (pre-op)*   Induction: Intravenous  PONV Risk Score and Plan: 2 and Ondansetron, Treatment may vary due to age or medical condition, Midazolam and Propofol infusion  Airway Management Planned: Nasal Cannula and Natural Airway  Additional Equipment: None  Intra-op Plan:   Post-operative Plan:   Informed Consent: I have reviewed the patients History and Physical, chart, labs and discussed the procedure including the risks, benefits and alternatives for the proposed anesthesia with the patient or authorized representative who has indicated his/her understanding and acceptance.       Plan Discussed  with: CRNA  Anesthesia Plan Comments: (See PAT note from 8/13 by Sherlie Ban PA-C  Lab Results      Component                Value               Date                      WBC                      7.4                 08/03/2023                HGB                      11.5 (L)            08/03/2023                HCT                      38.4 (L)            08/03/2023                MCV                      84.8  08/03/2023                PLT                      180                 08/03/2023           )        Anesthesia Quick Evaluation

## 2023-08-04 NOTE — Progress Notes (Signed)
Case: [id number] Date/Time: 08/09/23 1230   Procedure: TOTAL HIP ARTHROPLASTY ANTERIOR APPROACH (Right: Hip)   Anesthesia type: Choice   Pre-op diagnosis: right hip osteoarthritis   Location: [facility] ROOM 10 / [facility]   Surgeons: Ollen Gross, MD       DISCUSSION: Daniel Colon is a 77 year old male who presents to PAT prior to surgery above.  Past medical history significant for former smoking (quit 2012), history of lung cancer s/p right upper lobectomy, chemo (2021), CAD (MI, s/p RCA stent 1996, s/p CABG x 4 in May 2024), HTN, dyslipidemia, GERD, PUD/Barrett's esophagus (s/p Billroth II gastrojejunostomy, [place]). BMI is consistent with obesity.   No prior anesthesia complications  Patient follows with cardiology due to history of CAD status post CABG on 04/27/2023. After his CABG Dr. Antoine Poche recommended waiting 2 months until hip surgery. He presented to the ED on 6/4 for SOB. Positive D-dimer led to CTA which was negative for PE but did show bilateral pleural effusions.  He was initially diuresed with Lasix but this was transitioned to torsemide with significant diuresis.  He was last seen on 06/23/2023 and torsemide was reduced to 10 mg daily.  Initial plan was to reevaluate for continued need for scheduled diuretic versus as needed dosing. He reported chronic DOE after lung surgery. Per Cardiology on 08/03/23:  "Preoperative risk evaluation for Mace prior to hip surgery Given his CAD and diastolic heart failure requiring diuretic, he has a 6.6% risk of Mace.  He is now near euvolemic.  He understands his risk and wishes to proceed. I do not think he has any unstable cardiac conditions. Will need to cautious with IVF during and after surgery. No further cardiovascular evaluation prior to surgery. Given recent CABG, we prefer to continue ASA throughout the perioperative period."  He followed up with his CT surgeon on 05/28/23 who thought patient was improving. Cleared for cardiac rehab and advised to f/u in  prn.  Follows with Pulmonology after his lung surgery and for COPD. Last seen 10/12/2022. Uses inhalers and undergoes surveillance CTs.  VS: BP (!) 149/67   Pulse 63   Temp 36.5 C (Oral)   Ht 5\' 8"  (1.727 m)   Wt 81.2 kg   SpO2 98%   BMI 27.22 kg/m   PROVIDERS: Garlan Fillers, MD Pulmonology: Chilton Greathouse, MD Cardiology:  Rollene Rotunda, MD CT surgery: Brynda Greathouse, MD  LABS: Labs reviewed: Acceptable for surgery. (all labs ordered are listed, but only abnormal results are displayed)  Labs Reviewed  BASIC METABOLIC PANEL - Abnormal; Notable for the following components:      Result Value   Glucose, Bld 102 (*)    Calcium 8.8 (*)    All other components within normal limits  CBC - Abnormal; Notable for the following components:   Hemoglobin 11.5 (*)    HCT 38.4 (*)    MCH 25.4 (*)    MCHC 29.9 (*)    RDW 17.2 (*)    All other components within normal limits  SURGICAL PCR SCREEN  TYPE AND SCREEN     IMAGES:  CTA Chest 05/25/23:  IMPRESSION: 1. No evidence of significant pulmonary embolus. 2. Cardiac enlargement with bilateral pleural effusions and interstitial edema in the lungs. Small pericardial effusion. 3. Chronic changes including scarring in the lung bases, right apical pleural thickening, and probable postoperative changes in the right upper lung. 4. Mediastinal lymphadenopathy is enlarging since prior study. Nonspecific etiology. In the setting of prior lung cancer, metastatic disease  is not excluded. These could be reactive as well. 5. Aortic atherosclerosis.        EKG 08/03/23:   Sinus rhythm with 1st degree A-V block Rightward axis Cannot rule out Anterior infarct (cited on or before 25-May-2023) ST & T wave abnormality, consider inferolateral ischemia When compared with ECG of 23-Jun-2023 08:40, Questionable change in initial forces of Septal leads Confirmed by Micah Flesher ([id number]) on 08/03/2023 10:09:30 AM     CV:  Carotid  doppler 04/23/23:  Summary:  Right Carotid: Velocities in the right ICA are consistent with a 1-39%  stenosis.   Left Carotid: The extracranial vessels were near-normal with only minimal  wall               thickening or plaque.  Vertebrals:  Bilateral vertebral arteries demonstrate antegrade flow.  Subclavians: Bilateral subclavian artery flow was disturbed.   Right ABI: Multphasic PTA and DPA.  Left ABI: Multphasic PTA and DPA.    Right Upper Extremity: Doppler waveform obliterate with right radial  compression. Doppler waveforms remain within normal limits with right  ulnar compression.  Left Upper Extremity: Doppler waveforms remain within normal limits with  left radial compression. Doppler waveforms remain within normal limits  with left ulnar compression.      Left heart cath 04/22/23:    Suezanne Jacquet RCA lesion is 70% stenosed.   Prox RCA lesion is 50% stenosed.   Mid RCA lesion is 70% stenosed.   Mid LM to Dist LM lesion is 90% stenosed.   Ost Cx to Prox Cx lesion is 90% stenosed.   Mid Cx lesion is 80% stenosed.   Dist LM to Prox LAD lesion is 99% stenosed.   Mid LAD lesion is 80% stenosed.   Severe distal left main stenosis Severe ostial LAD stenosis. Severe mid LAD stenosis. The distal LAD fills from antegrade flow and from right to left collaterals.  Severe ostial Circumflex stenosis. Severe stenosis mid Circumflex Large dominant RCA with moderate ostial stenosis, patent mid stented segment with moderate in stent restenosis and moderately severe mid stenosis beyond the stent (RFR 0.90 today with pressure wire analysis suggesting the disease in the RCA is borderline but likely severe enough that a bypass graft would remain patent to the distal RCA).  Normal LV filling pressures.    Recommendations: Will admit to telemetry today. Will ask CT surgery to see him to discuss CABG. He has been stable at home. Will not start IV heparin today. Will continue ASA, statin and beta  blocker.  NM Stress test 04/20/23:  Narrative & Impression      Findings are consistent with ischemia. The study is high risk.   Findings in combination that are high risk include decrease in EF with stress, TID, large perfusion defect, no augmentation of CFR with stress. Recommend cardiac catheterization if clinically appropriate. Critical findings discussed with Dr. Antoine Poche 04/20/23 at 15:10 and acknowledged.   LV perfusion is abnormal. There is evidence of ischemia. There is no evidence of infarction. Defect 1: There is a large defect with severe reduction in uptake present in the apical to basal location(s) that is reversible, primarily involving the septal, anterior and lateral walls from apex to mid, and the anteroseptal and inferolateral walls at the base. There is abnormal wall motion in the defect area. Consistent with ischemia.   Rest left ventricular function is abnormal. Rest global function is mildly reduced. Rest EF: 48 %. Stress left ventricular function is abnormal. Stress global function is mildly  reduced. Stress EF: 44 %. End diastolic cavity size is moderately enlarged. End systolic cavity size is moderately enlarged. Evidence of transient ischemic dilation (TID) noted. TID was appreciated visually and quantitatively.   Myocardial blood flow was computed to be 0.15ml/g/min at rest and 0.63ml/g/min at stress. Global myocardial blood flow reserve was 0.68 and was highly abnormal.   Coronary calcium assessment not performed due to prior revascularization.   Electronically signed by: Parke Poisson, MD      Echo 03/25/2023:  IMPRESSIONS     1. Left ventricular ejection fraction, by estimation, is 55 to 60%. The  left ventricle has normal function. The left ventricle has no regional  wall motion abnormalities. There is mild concentric left ventricular  hypertrophy. Left ventricular diastolic  parameters were normal.   2. Right ventricular systolic function is normal. The right  ventricular  size is normal.   3. Left atrial size was mildly dilated.   4. Right atrial size was mildly dilated.   5. The mitral valve is normal in structure. Trivial mitral valve  regurgitation. No evidence of mitral stenosis.   6. The aortic valve is tricuspid. There is mild calcification of the  aortic valve. Aortic valve regurgitation is trivial. No aortic stenosis is  present.   7. Aortic dilatation noted. There is borderline dilatation of the  ascending aorta, measuring 38 mm.   8. The inferior vena cava is normal in size with greater than 50%  respiratory variability, suggesting right atrial pressure of 3 mmHg.   Past Medical History:  Diagnosis Date   Allergy    Arthritis    Barrett's esophagus    CAD (coronary artery disease)    a. Stent to the RCA 1996 (in [place]);  b. ETT(11/13/13): abnormal with early ST changes to suggest ischemia;   c. LHC (11/17/13):  pLAD 30-40, D1 30, mLAD 70-75, mCFX 70, mRCA stent 40 (ISR), EF 55-65%.  Medical Rx   Cataract    left eye removed, right immature   Colon polyps    Dyslipidemia    Dyspnea    GERD (gastroesophageal reflux disease)    History of kidney stones    Hyperlipidemia    Hypertension    Hypogonadism in male    Myocardial infarction (HCC) 1996   NSCL Ca dx'd 12/2019   Peptic ulcer disease    PUD (peptic ulcer disease)     Past Surgical History:  Procedure Laterality Date   ANGIOPLASTY     stent - rt cor. art   BILROTH II PROCEDURE     CARDIAC CATHETERIZATION     CIRCUMCISION     COLONOSCOPY     CORONARY ARTERY BYPASS GRAFT N/A 04/27/2023   Procedure: CORONARY ARTERY BYPASS GRAFTING (CABG) X 4, USING LEFT INTERNAL MAMMARY ARTERY AND RIGHT LEG GREATER SAPHENOUS VEIN HARVESTED ENDOSCOPICALLY;  Surgeon: Corliss Skains, MD;  Location: [facility] OR;  Service: Open Heart Surgery;  Laterality: N/A;   CORONARY PRESSURE/FFR STUDY N/A 04/22/2023   Procedure: CORONARY PRESSURE/FFR STUDY;  Surgeon: Kathleene Hazel, MD;   Location: [facility] INVASIVE CV LAB;  Service: Cardiovascular;  Laterality: N/A;   INTERCOSTAL NERVE BLOCK Right 01/24/2020   Procedure: Intercostal Nerve Block;  Surgeon: Delight Ovens, MD;  Location: [facility] OR;  Service: Thoracic;  Laterality: Right;   KNEE ARTHROSCOPY Left    LEFT HEART CATH AND CORONARY ANGIOGRAPHY N/A 04/22/2023   Procedure: LEFT HEART CATH AND CORONARY ANGIOGRAPHY;  Surgeon: Kathleene Hazel, MD;  Location: [facility] INVASIVE  CV LAB;  Service: Cardiovascular;  Laterality: N/A;   LEFT HEART CATHETERIZATION WITH CORONARY ANGIOGRAM N/A 11/17/2013   Procedure: LEFT HEART CATHETERIZATION WITH CORONARY ANGIOGRAM;  Surgeon: Micheline Chapman, MD;  Location[facility] CATH LAB;  Service: Cardiovascular;  Laterality: N/A;   LUNG LOBECTOMY  01/2020   had chemo, no radtion   LYMPH NODE DISSECTION N/A 01/24/2020   Procedure: LYMPH NODE DISSECTION;  Surgeon: Delight Ovens, MD;  Location: [facility] OR;  Service: Thoracic;  Laterality: N/A;   NECK LESION BIOPSY     POLYPECTOMY     REFRACTIVE SURGERY     SKIN BIOPSY     Back   TEE WITHOUT CARDIOVERSION N/A 04/27/2023   Procedure: TRANSESOPHAGEAL ECHOCARDIOGRAM;  Surgeon: Corliss Skains, MD;  Location: [facility];  Service: Open Heart Surgery;  Laterality: N/A;   Thigh surgery Left    Infant   ulcer surgery     ULNAR NERVE TRANSPOSITION Right 08/05/2017   Procedure: RIGHT ULNAR NERVE DECOMPRESSION;  Surgeon: Betha Loa, MD;  Location: [facility];  Service: Orthopedics;  Laterality: Right;   UPPER GI ENDOSCOPY     VIDEO BRONCHOSCOPY N/A 01/24/2020   Procedure: VIDEO BRONCHOSCOPY;  Surgeon: Delight Ovens, MD;  Location: [facility] OR;  Service: Thoracic;  Laterality: N/A;   WISDOM TOOTH EXTRACTION      MEDICATIONS:  acetaminophen (TYLENOL) 650 MG CR tablet   albuterol (VENTOLIN HFA) 108 (90 Base) MCG/ACT inhaler   aspirin EC 81 MG tablet   atorvastatin (LIPITOR) 40 MG tablet   bisacodyl (DULCOLAX) 5 MG EC tablet   BREZTRI  AEROSPHERE 160-9-4.8 MCG/ACT AERO   docusate sodium (COLACE) 100 MG capsule   ezetimibe (ZETIA) 10 MG tablet   fluorouracil (EFUDEX) 5 % cream   GNP IRON 200 (65 Fe) MG TABS   metoprolol tartrate (LOPRESSOR) 25 MG tablet   Multiple Vitamin (MULTIVITAMIN WITH MINERALS) TABS tablet   nitroGLYCERIN (NITROSTAT) 0.4 MG SL tablet   omeprazole (PRILOSEC) 10 MG capsule   polyethylene glycol (MIRALAX / GLYCOLAX) 17 g packet   potassium chloride (KLOR-CON) 10 MEQ tablet   senna (SENOKOT) 8.6 MG tablet   sildenafil (VIAGRA) 100 MG tablet   torsemide (DEMADEX) 20 MG tablet   traMADol (ULTRAM) 50 MG tablet   No current facility-administered medications for this encounter.   Marcille Blanco [facility] Surgical Short Stay/Anesthesiology [facility] Phone [phone] 08/04/2023 9:59 AM

## 2023-08-09 ENCOUNTER — Encounter (HOSPITAL_COMMUNITY)
Admission: RE | Disposition: A | Discharge status: Home / Self Care | Payer: Self-pay | Source: Home / Self Care | Attending: Orthopedic Surgery

## 2023-08-09 ENCOUNTER — Ambulatory Visit (HOSPITAL_COMMUNITY): Payer: Medicare Other

## 2023-08-09 ENCOUNTER — Encounter (HOSPITAL_COMMUNITY): Payer: Self-pay | Admitting: Orthopedic Surgery

## 2023-08-09 ENCOUNTER — Ambulatory Visit (HOSPITAL_COMMUNITY): Payer: Medicare Other | Admitting: Medical

## 2023-08-09 ENCOUNTER — Other Ambulatory Visit: Payer: Self-pay

## 2023-08-09 ENCOUNTER — Observation Stay (HOSPITAL_COMMUNITY)
Admission: RE | Admit: 2023-08-09 | Discharge: 2023-08-10 | Disposition: A | Discharge status: Home / Self Care | Payer: Medicare Other | Attending: Orthopedic Surgery | Admitting: Orthopedic Surgery

## 2023-08-09 ENCOUNTER — Ambulatory Visit (HOSPITAL_BASED_OUTPATIENT_CLINIC_OR_DEPARTMENT_OTHER): Payer: Medicare Other | Admitting: Anesthesiology

## 2023-08-09 DIAGNOSIS — Z951 Presence of aortocoronary bypass graft: Secondary | ICD-10-CM | POA: Diagnosis not present

## 2023-08-09 DIAGNOSIS — I10 Essential (primary) hypertension: Secondary | ICD-10-CM

## 2023-08-09 DIAGNOSIS — Z7982 Long term (current) use of aspirin: Secondary | ICD-10-CM | POA: Insufficient documentation

## 2023-08-09 DIAGNOSIS — J449 Chronic obstructive pulmonary disease, unspecified: Secondary | ICD-10-CM | POA: Diagnosis not present

## 2023-08-09 DIAGNOSIS — Z85118 Personal history of other malignant neoplasm of bronchus and lung: Secondary | ICD-10-CM | POA: Insufficient documentation

## 2023-08-09 DIAGNOSIS — M169 Osteoarthritis of hip, unspecified: Principal | ICD-10-CM

## 2023-08-09 DIAGNOSIS — M1611 Unilateral primary osteoarthritis, right hip: Secondary | ICD-10-CM | POA: Diagnosis not present

## 2023-08-09 DIAGNOSIS — Z87891 Personal history of nicotine dependence: Secondary | ICD-10-CM | POA: Insufficient documentation

## 2023-08-09 DIAGNOSIS — Z96641 Presence of right artificial hip joint: Secondary | ICD-10-CM | POA: Diagnosis not present

## 2023-08-09 DIAGNOSIS — Z79899 Other long term (current) drug therapy: Secondary | ICD-10-CM | POA: Insufficient documentation

## 2023-08-09 DIAGNOSIS — I2510 Atherosclerotic heart disease of native coronary artery without angina pectoris: Secondary | ICD-10-CM | POA: Insufficient documentation

## 2023-08-09 DIAGNOSIS — J439 Emphysema, unspecified: Secondary | ICD-10-CM | POA: Diagnosis not present

## 2023-08-09 DIAGNOSIS — I25110 Atherosclerotic heart disease of native coronary artery with unstable angina pectoris: Secondary | ICD-10-CM | POA: Diagnosis not present

## 2023-08-09 DIAGNOSIS — E785 Hyperlipidemia, unspecified: Secondary | ICD-10-CM | POA: Diagnosis not present

## 2023-08-09 HISTORY — PX: TOTAL HIP ARTHROPLASTY: SHX124

## 2023-08-09 SURGERY — ARTHROPLASTY, HIP, TOTAL, ANTERIOR APPROACH
Anesthesia: Spinal | Site: Hip | Laterality: Right

## 2023-08-09 MED ORDER — MAGNESIUM CITRATE PO SOLN: 1.0000 | Freq: Once | ORAL | Status: DC | PRN

## 2023-08-09 MED ORDER — DROPERIDOL 2.5 MG/ML IJ SOLN: 0.6250 mg | Freq: Once | INTRAMUSCULAR | Status: DC | PRN

## 2023-08-09 MED ORDER — LACTATED RINGERS IV SOLN: INTRAVENOUS | Status: DC

## 2023-08-09 MED ORDER — ATORVASTATIN CALCIUM 40 MG PO TABS: 40.0000 mg | ORAL_TABLET | Freq: Every day | ORAL | Status: DC

## 2023-08-09 MED ORDER — CEFAZOLIN SODIUM-DEXTROSE 2-4 GM/100ML-% IV SOLN
2.0000 g | INTRAVENOUS | Status: AC
  Administered 2023-08-09: 2 g via INTRAVENOUS
  Filled 2023-08-09: qty 100

## 2023-08-09 MED ORDER — DOCUSATE SODIUM 100 MG PO CAPS
100.0000 mg | ORAL_CAPSULE | Freq: Two times a day (BID) | ORAL | Status: DC
  Administered 2023-08-09 – 2023-08-10 (×2): 100 mg via ORAL
  Filled 2023-08-09 (×2): qty 1

## 2023-08-09 MED ORDER — METOCLOPRAMIDE HCL 5 MG PO TABS: 5.0000 mg | ORAL_TABLET | Freq: Three times a day (TID) | ORAL | Status: DC | PRN

## 2023-08-09 MED ORDER — HYDROCODONE-ACETAMINOPHEN 7.5-325 MG PO TABS
1.0000 | ORAL_TABLET | ORAL | Status: DC | PRN
  Administered 2023-08-09 – 2023-08-10 (×3): 2 via ORAL
  Filled 2023-08-09 (×3): qty 2

## 2023-08-09 MED ORDER — TRANEXAMIC ACID-NACL 1000-0.7 MG/100ML-% IV SOLN
1000.0000 mg | INTRAVENOUS | Status: AC
  Administered 2023-08-09: 1000 mg via INTRAVENOUS
  Filled 2023-08-09: qty 100

## 2023-08-09 MED ORDER — BUDESON-GLYCOPYRROL-FORMOTEROL 160-9-4.8 MCG/ACT IN AERO: 2.0000 | INHALATION_SPRAY | Freq: Every day | RESPIRATORY_TRACT | Status: DC

## 2023-08-09 MED ORDER — HYDROCODONE-ACETAMINOPHEN 5-325 MG PO TABS
1.0000 | ORAL_TABLET | ORAL | Status: DC | PRN
  Administered 2023-08-09: 1 via ORAL
  Filled 2023-08-09: qty 1

## 2023-08-09 MED ORDER — DEXAMETHASONE SODIUM PHOSPHATE 10 MG/ML IJ SOLN
8.0000 mg | Freq: Once | INTRAMUSCULAR | Status: AC
  Administered 2023-08-09: 8 mg via INTRAVENOUS

## 2023-08-09 MED ORDER — ONDANSETRON HCL 4 MG PO TABS: 4.0000 mg | ORAL_TABLET | Freq: Four times a day (QID) | ORAL | Status: DC | PRN

## 2023-08-09 MED ORDER — TRAMADOL HCL 50 MG PO TABS
50.0000 mg | ORAL_TABLET | Freq: Four times a day (QID) | ORAL | Status: DC | PRN
  Administered 2023-08-09 – 2023-08-10 (×2): 100 mg via ORAL
  Filled 2023-08-09 (×2): qty 2

## 2023-08-09 MED ORDER — BUPIVACAINE-EPINEPHRINE (PF) 0.25% -1:200000 IJ SOLN
INTRAMUSCULAR | Status: DC | PRN
  Administered 2023-08-09: 30 mL via PERINEURAL

## 2023-08-09 MED ORDER — MENTHOL 3 MG MT LOZG: 1.0000 | LOZENGE | OROMUCOSAL | Status: DC | PRN

## 2023-08-09 MED ORDER — FENTANYL CITRATE (PF) 100 MCG/2ML IJ SOLN
INTRAMUSCULAR | Status: AC
  Filled 2023-08-09: qty 2

## 2023-08-09 MED ORDER — PHENOL 1.4 % MT LIQD: 1.0000 | OROMUCOSAL | Status: DC | PRN

## 2023-08-09 MED ORDER — ALBUMIN HUMAN 5 % IV SOLN
INTRAVENOUS | Status: DC | PRN
Start: 2023-08-09 — End: 2023-08-09

## 2023-08-09 MED ORDER — ONDANSETRON HCL 4 MG/2ML IJ SOLN: 4.0000 mg | Freq: Four times a day (QID) | INTRAMUSCULAR | Status: DC | PRN

## 2023-08-09 MED ORDER — EZETIMIBE 10 MG PO TABS: 10.0000 mg | ORAL_TABLET | Freq: Every day | ORAL | Status: DC

## 2023-08-09 MED ORDER — DEXMEDETOMIDINE HCL IN NACL 80 MCG/20ML IV SOLN
INTRAVENOUS | Status: DC | PRN
  Administered 2023-08-09 (×2): 4 ug via INTRAVENOUS

## 2023-08-09 MED ORDER — ONDANSETRON HCL 4 MG/2ML IJ SOLN
INTRAMUSCULAR | Status: DC | PRN
  Administered 2023-08-09: 4 mg via INTRAVENOUS

## 2023-08-09 MED ORDER — METOPROLOL TARTRATE 25 MG PO TABS
25.0000 mg | ORAL_TABLET | Freq: Two times a day (BID) | ORAL | Status: DC
  Administered 2023-08-09 – 2023-08-10 (×2): 25 mg via ORAL
  Filled 2023-08-09 (×2): qty 1

## 2023-08-09 MED ORDER — MEPERIDINE HCL 50 MG/ML IJ SOLN: 6.2500 mg | INTRAMUSCULAR | Status: DC | PRN

## 2023-08-09 MED ORDER — BUPIVACAINE IN DEXTROSE 0.75-8.25 % IT SOLN
INTRATHECAL | Status: DC | PRN
  Administered 2023-08-09: 2 mL via INTRATHECAL

## 2023-08-09 MED ORDER — ALBUTEROL SULFATE (2.5 MG/3ML) 0.083% IN NEBU: 3.0000 mL | INHALATION_SOLUTION | Freq: Every day | RESPIRATORY_TRACT | Status: DC | PRN

## 2023-08-09 MED ORDER — ACETAMINOPHEN 160 MG/5ML PO SOLN: 325.0000 mg | Freq: Once | ORAL | Status: DC | PRN

## 2023-08-09 MED ORDER — ACETAMINOPHEN 10 MG/ML IV SOLN
1000.0000 mg | Freq: Four times a day (QID) | INTRAVENOUS | Status: DC
  Administered 2023-08-09: 1000 mg via INTRAVENOUS
  Filled 2023-08-09: qty 100

## 2023-08-09 MED ORDER — BISACODYL 10 MG RE SUPP: 10.0000 mg | Freq: Every day | RECTAL | Status: DC | PRN

## 2023-08-09 MED ORDER — CEFAZOLIN SODIUM-DEXTROSE 2-4 GM/100ML-% IV SOLN
2.0000 g | Freq: Four times a day (QID) | INTRAVENOUS | Status: AC
  Administered 2023-08-09 – 2023-08-10 (×2): 2 g via INTRAVENOUS
  Filled 2023-08-09 (×2): qty 100

## 2023-08-09 MED ORDER — RIVAROXABAN 10 MG PO TABS
10.0000 mg | ORAL_TABLET | Freq: Every day | ORAL | Status: DC
  Administered 2023-08-10: 10 mg via ORAL
  Filled 2023-08-09: qty 1

## 2023-08-09 MED ORDER — 0.9 % SODIUM CHLORIDE (POUR BTL) OPTIME
TOPICAL | Status: DC | PRN
  Administered 2023-08-09: 1000 mL

## 2023-08-09 MED ORDER — NITROGLYCERIN 0.4 MG SL SUBL: 0.4000 mg | SUBLINGUAL_TABLET | SUBLINGUAL | Status: DC | PRN

## 2023-08-09 MED ORDER — ACETAMINOPHEN 325 MG PO TABS: 325.0000 mg | ORAL_TABLET | Freq: Once | ORAL | Status: DC | PRN

## 2023-08-09 MED ORDER — POTASSIUM CHLORIDE CRYS ER 10 MEQ PO TBCR
10.0000 meq | EXTENDED_RELEASE_TABLET | Freq: Every day | ORAL | Status: DC
  Administered 2023-08-09 – 2023-08-10 (×2): 10 meq via ORAL
  Filled 2023-08-09 (×2): qty 1

## 2023-08-09 MED ORDER — PROMETHAZINE HCL 25 MG/ML IJ SOLN: 6.2500 mg | INTRAMUSCULAR | Status: DC | PRN

## 2023-08-09 MED ORDER — ONDANSETRON HCL 4 MG/2ML IJ SOLN
INTRAMUSCULAR | Status: AC
  Filled 2023-08-09: qty 2

## 2023-08-09 MED ORDER — PROPOFOL 500 MG/50ML IV EMUL
INTRAVENOUS | Status: DC | PRN
  Administered 2023-08-09: 50 ug/kg/min via INTRAVENOUS

## 2023-08-09 MED ORDER — POVIDONE-IODINE 10 % EX SWAB: 2.0000 | Freq: Once | CUTANEOUS | Status: DC

## 2023-08-09 MED ORDER — SODIUM CHLORIDE 0.9 % IV SOLN: INTRAVENOUS | Status: DC

## 2023-08-09 MED ORDER — FENTANYL CITRATE (PF) 100 MCG/2ML IJ SOLN
INTRAMUSCULAR | Status: DC | PRN
  Administered 2023-08-09 (×2): 25 ug via INTRAVENOUS

## 2023-08-09 MED ORDER — DEXMEDETOMIDINE HCL IN NACL 80 MCG/20ML IV SOLN
INTRAVENOUS | Status: AC
  Filled 2023-08-09: qty 20

## 2023-08-09 MED ORDER — PANTOPRAZOLE SODIUM 40 MG PO TBEC
40.0000 mg | DELAYED_RELEASE_TABLET | Freq: Every day | ORAL | Status: DC
  Administered 2023-08-09 – 2023-08-10 (×2): 40 mg via ORAL
  Filled 2023-08-09 (×2): qty 1

## 2023-08-09 MED ORDER — FERROUS SULFATE 325 (65 FE) MG PO TABS: 325.0000 mg | ORAL_TABLET | Freq: Every day | ORAL | Status: DC

## 2023-08-09 MED ORDER — METOCLOPRAMIDE HCL 5 MG/ML IJ SOLN: 5.0000 mg | Freq: Three times a day (TID) | INTRAMUSCULAR | Status: DC | PRN

## 2023-08-09 MED ORDER — BUPIVACAINE-EPINEPHRINE 0.25% -1:200000 IJ SOLN
INTRAMUSCULAR | Status: AC
  Filled 2023-08-09: qty 1

## 2023-08-09 MED ORDER — WATER FOR IRRIGATION, STERILE IR SOLN
Status: DC | PRN
  Administered 2023-08-09: 2000 mL

## 2023-08-09 MED ORDER — ORAL CARE MOUTH RINSE: 15.0000 mL | Freq: Once | OROMUCOSAL | Status: AC

## 2023-08-09 MED ORDER — FLUTICASONE FUROATE-VILANTEROL 200-25 MCG/ACT IN AEPB
1.0000 | INHALATION_SPRAY | Freq: Every day | RESPIRATORY_TRACT | Status: DC
  Administered 2023-08-10: 1 via RESPIRATORY_TRACT
  Filled 2023-08-09: qty 28

## 2023-08-09 MED ORDER — DEXAMETHASONE SODIUM PHOSPHATE 10 MG/ML IJ SOLN
INTRAMUSCULAR | Status: AC
  Filled 2023-08-09: qty 1

## 2023-08-09 MED ORDER — CHLORHEXIDINE GLUCONATE 0.12 % MT SOLN
15.0000 mL | Freq: Once | OROMUCOSAL | Status: AC
  Administered 2023-08-09: 15 mL via OROMUCOSAL

## 2023-08-09 MED ORDER — PHENYLEPHRINE HCL-NACL 20-0.9 MG/250ML-% IV SOLN
INTRAVENOUS | Status: DC | PRN
Start: 2023-08-09 — End: 2023-08-09
  Administered 2023-08-09: 40 ug/min via INTRAVENOUS

## 2023-08-09 MED ORDER — ACETAMINOPHEN 10 MG/ML IV SOLN: 1000.0000 mg | Freq: Once | INTRAVENOUS | Status: DC | PRN

## 2023-08-09 MED ORDER — METHOCARBAMOL 500 MG IVPB - SIMPLE MED: 500.0000 mg | Freq: Four times a day (QID) | INTRAVENOUS | Status: DC | PRN

## 2023-08-09 MED ORDER — ACETAMINOPHEN 325 MG PO TABS: 325.0000 mg | ORAL_TABLET | Freq: Four times a day (QID) | ORAL | Status: DC | PRN

## 2023-08-09 MED ORDER — UMECLIDINIUM BROMIDE 62.5 MCG/ACT IN AEPB
1.0000 | INHALATION_SPRAY | Freq: Every day | RESPIRATORY_TRACT | Status: DC
  Administered 2023-08-10: 1 via RESPIRATORY_TRACT
  Filled 2023-08-09: qty 7

## 2023-08-09 MED ORDER — ACETAMINOPHEN 500 MG PO TABS: 1000.0000 mg | ORAL_TABLET | Freq: Once | ORAL | Status: DC

## 2023-08-09 MED ORDER — METHOCARBAMOL 500 MG PO TABS
500.0000 mg | ORAL_TABLET | Freq: Four times a day (QID) | ORAL | Status: DC | PRN
  Administered 2023-08-09 – 2023-08-10 (×2): 500 mg via ORAL
  Filled 2023-08-09 (×2): qty 1

## 2023-08-09 MED ORDER — MORPHINE SULFATE (PF) 2 MG/ML IV SOLN
0.5000 mg | INTRAVENOUS | Status: DC | PRN
  Administered 2023-08-09 – 2023-08-10 (×2): 1 mg via INTRAVENOUS
  Filled 2023-08-09 (×2): qty 1

## 2023-08-09 MED ORDER — TORSEMIDE 20 MG PO TABS
20.0000 mg | ORAL_TABLET | Freq: Every day | ORAL | Status: DC
  Administered 2023-08-10: 20 mg via ORAL
  Filled 2023-08-09: qty 1

## 2023-08-09 MED ORDER — ALBUMIN HUMAN 5 % IV SOLN
INTRAVENOUS | Status: AC
  Filled 2023-08-09: qty 250

## 2023-08-09 MED ORDER — PROPOFOL 500 MG/50ML IV EMUL: INTRAVENOUS | Status: DC | PRN

## 2023-08-09 MED ORDER — HYDROMORPHONE HCL 1 MG/ML IJ SOLN: 0.2500 mg | INTRAMUSCULAR | Status: DC | PRN

## 2023-08-09 MED ORDER — POLYETHYLENE GLYCOL 3350 17 G PO PACK: 17.0000 g | PACK | Freq: Every day | ORAL | Status: DC | PRN

## 2023-08-09 MED ORDER — DEXAMETHASONE SODIUM PHOSPHATE 10 MG/ML IJ SOLN
10.0000 mg | Freq: Once | INTRAMUSCULAR | Status: AC
  Administered 2023-08-10: 10 mg via INTRAVENOUS
  Filled 2023-08-09: qty 1

## 2023-08-09 MED ORDER — LIDOCAINE HCL (PF) 2 % IJ SOLN
INTRAMUSCULAR | Status: AC
  Filled 2023-08-09: qty 5

## 2023-08-09 MED ORDER — PROPOFOL 1000 MG/100ML IV EMUL
INTRAVENOUS | Status: AC
  Filled 2023-08-09: qty 100

## 2023-08-09 MED ORDER — LIDOCAINE HCL (CARDIAC) PF 100 MG/5ML IV SOSY
PREFILLED_SYRINGE | INTRAVENOUS | Status: DC | PRN
  Administered 2023-08-09: 40 mg via INTRAVENOUS

## 2023-08-09 SURGICAL SUPPLY — 41 items
ADH SKN CLS APL DERMABOND .7 (GAUZE/BANDAGES/DRESSINGS) ×1
BAG COUNTER SPONGE SURGICOUNT (BAG) IMPLANT
BAG SPEC THK2 15X12 ZIP CLS (MISCELLANEOUS)
BAG SPNG CNTER NS LX DISP (BAG)
BAG ZIPLOCK 12X15 (MISCELLANEOUS) IMPLANT
BLADE SAG 18X100X1.27 (BLADE) ×1 IMPLANT
COVER PERINEAL POST (MISCELLANEOUS) ×1 IMPLANT
COVER SURGICAL LIGHT HANDLE (MISCELLANEOUS) ×1 IMPLANT
CUP ACETBLR 54 OD PINNACLE (Hips) IMPLANT
DERMABOND ADVANCED .7 DNX12 (GAUZE/BANDAGES/DRESSINGS) ×1 IMPLANT
DRAPE FOOT SWITCH (DRAPES) ×1 IMPLANT
DRAPE STERI IOBAN 125X83 (DRAPES) ×1 IMPLANT
DRAPE U-SHAPE 47X51 STRL (DRAPES) ×2 IMPLANT
DRSG AQUACEL AG ADV 3.5X10 (GAUZE/BANDAGES/DRESSINGS) ×1 IMPLANT
DURAPREP 26ML APPLICATOR (WOUND CARE) ×1 IMPLANT
ELECT REM PT RETURN 15FT ADLT (MISCELLANEOUS) ×1 IMPLANT
GLOVE BIO SURGEON STRL SZ 6.5 (GLOVE) IMPLANT
GLOVE BIO SURGEON STRL SZ8 (GLOVE) ×1 IMPLANT
GLOVE BIOGEL PI IND STRL 6.5 (GLOVE) IMPLANT
GLOVE BIOGEL PI IND STRL 7.0 (GLOVE) IMPLANT
GLOVE BIOGEL PI IND STRL 8 (GLOVE) ×1 IMPLANT
GOWN STRL REUS W/ TWL LRG LVL3 (GOWN DISPOSABLE) ×1 IMPLANT
GOWN STRL REUS W/TWL LRG LVL3 (GOWN DISPOSABLE) ×1
HEAD M SROM 36MM PLUS 1.5 (Hips) IMPLANT
HOLDER FOLEY CATH W/STRAP (MISCELLANEOUS) ×1 IMPLANT
KIT TURNOVER KIT A (KITS) IMPLANT
LINER MARATHON NEUT +4X54X36 (Hips) IMPLANT
MANIFOLD NEPTUNE II (INSTRUMENTS) ×1 IMPLANT
PACK ANTERIOR HIP CUSTOM (KITS) ×1 IMPLANT
PENCIL SMOKE EVACUATOR COATED (MISCELLANEOUS) ×1 IMPLANT
SPIKE FLUID TRANSFER (MISCELLANEOUS) ×1 IMPLANT
SROM M HEAD 36MM PLUS 1.5 (Hips) ×1 IMPLANT
STEM FEM ACTIS STD SZ4 (Stem) IMPLANT
SUT ETHIBOND NAB CT1 #1 30IN (SUTURE) ×1 IMPLANT
SUT MNCRL AB 4-0 PS2 18 (SUTURE) ×1 IMPLANT
SUT STRATAFIX 0 PDS 27 VIOLET (SUTURE) ×1
SUT VIC AB 2-0 CT1 27 (SUTURE) ×2
SUT VIC AB 2-0 CT1 TAPERPNT 27 (SUTURE) ×2 IMPLANT
SUTURE STRATFX 0 PDS 27 VIOLET (SUTURE) ×1 IMPLANT
TRAY FOLEY MTR SLVR 16FR STAT (SET/KITS/TRAYS/PACK) ×1 IMPLANT
TUBE SUCTION HIGH CAP CLEAR NV (SUCTIONS) ×1 IMPLANT

## 2023-08-09 NOTE — Transfer of Care (Signed)
Immediate Anesthesia Transfer of Care Note  Patient: Daniel Colon  Procedure(s) Performed: TOTAL HIP ARTHROPLASTY ANTERIOR APPROACH (Right: Hip)  Patient Location: PACU  Anesthesia Type:Spinal  Level of Consciousness: drowsy and patient cooperative  Airway & Oxygen Therapy: Patient Spontanous Breathing and Patient connected to face mask oxygen  Post-op Assessment: Report given to RN and Post -op Vital signs reviewed and stable  Post vital signs: Reviewed and stable  Last Vitals:  Vitals Value Taken Time  BP 106/55 08/09/23 1500  Temp 36.5 C 08/09/23 1445  Pulse 66 08/09/23 1511  Resp 22 08/09/23 1511  SpO2 100 % 08/09/23 1511  Vitals shown include unfiled device data.  Last Pain:  Vitals:   08/09/23 1500  TempSrc:   PainSc: 0-No pain      Patients Stated Pain Goal: 4 (08/09/23 1105)  Complications: No notable events documented.

## 2023-08-09 NOTE — Anesthesia Procedure Notes (Addendum)
Spinal  Start time: 08/09/2023 1:15 PM End time: 08/09/2023 1:18 PM Reason for block: surgical anesthesia Staffing Performed: anesthesiologist  Anesthesiologist: Shelton Silvas, MD Performed by: Shelton Silvas, MD Authorized by: Shelton Silvas, MD   Preanesthetic Checklist Completed: patient identified, IV checked, site marked, risks and benefits discussed, surgical consent, monitors and equipment checked, pre-op evaluation and timeout performed Spinal Block Patient position: sitting Prep: DuraPrep and site prepped and draped Location: L3-4 Injection technique: single-shot Needle Needle type: Pencan  Needle gauge: 24 G Needle length: 10 cm Needle insertion depth: 10 cm Assessment Events: CSF return and second provider Additional Notes Patient tolerated well. No immediate complications.  Functioning IV was confirmed and monitors were applied. Sterile prep and drape, including hand hygiene and sterile gloves were used. The patient was positioned and the back was prepped. The skin was anesthetized with lidocaine. Free flow of clear CSF was obtained prior to injecting local anesthetic into the CSF. The spinal needle aspirated freely following injection. The needle was carefully withdrawn. The patient tolerated the procedure well.

## 2023-08-09 NOTE — Discharge Instructions (Addendum)
Daniel Arabian, MD Total Joint Specialist [facility] [place] [address], [address] [place], [place] [address] [phone]  ANTERIOR APPROACH TOTAL HIP REPLACEMENT POSTOPERATIVE DIRECTIONS     Hip Rehabilitation, Guidelines Following Surgery  The results of a hip operation are greatly improved after range of motion and muscle strengthening exercises. Follow all safety measures which are given to protect your hip. If any of these exercises cause increased pain or swelling in your joint, decrease the amount until you are comfortable again. Then slowly increase the exercises. Call your caregiver if you have problems or questions.   BLOOD CLOT PREVENTION Take a 10 mg Xarelto once a day for three weeks following surgery. Then resume one 81 mg aspirin once a day. You may resume your vitamins/supplements once you have discontinued the Xarelto. Do not take any NSAIDs (Advil, Aleve, Ibuprofen, Meloxicam, etc.) until you have discontinued the Xarelto.   HOME CARE INSTRUCTIONS  Remove items at home which could result in a fall. This includes throw rugs or furniture in walking pathways.  ICE to the affected hip as frequently as 20-30 minutes an hour and then as needed for pain and swelling. Continue to use ice on the hip for pain and swelling from surgery. You may notice swelling that will progress down to the foot and ankle. This is normal after surgery. Elevate the leg when you are not up walking on it.   Continue to use the breathing machine which will help keep your temperature down.  It is common for your temperature to cycle up and down following surgery, especially at night when you are not up moving around and exerting yourself.  The breathing machine keeps your lungs expanded and your temperature down.  DIET You may resume your previous home diet once your are discharged from the hospital.  DRESSING / WOUND CARE / SHOWERING You have an adhesive waterproof bandage over the  incision. Leave this in place until your first follow-up appointment. Once you remove this you will not need to place another bandage.  You may begin showering 3 days following surgery, but do not submerge the incision under water.  ACTIVITY For the first 3-5 days, it is important to rest and keep the operative leg elevated. You should, as a general rule, rest for 50 minutes and walk/stretch for 10 minutes per hour. After 5 days, you may slowly increase activity as tolerated.  Perform the exercises you were provided twice a day for about 15-20 minutes each session. Begin these 2 days following surgery. Walk with your walker as instructed. Use the walker until you are comfortable transitioning to a cane. Walk with the cane in the opposite hand of the operative leg. You may discontinue the cane once you are comfortable and walking steadily. Avoid periods of inactivity such as sitting longer than an hour when not asleep. This helps prevent blood clots.  Do not drive a car for 6 weeks or until released by your surgeon.  Do not drive while taking narcotics.  TED HOSE STOCKINGS Wear the elastic stockings on both legs for three weeks following surgery during the day. You may remove them at night while sleeping.  WEIGHT BEARING Weight bearing as tolerated with assist device (walker, cane, etc) as directed, use it as long as suggested by your surgeon or therapist, typically at least 4-6 weeks.  POSTOPERATIVE CONSTIPATION PROTOCOL Constipation - defined medically as fewer than three stools per week and severe constipation as less than one stool per week.  less than one stool per week.  One of the most common issues patients have following surgery is constipation.  Even if you have a regular bowel pattern at home, your normal regimen is likely to be disrupted due to multiple reasons following surgery.  Combination of anesthesia, postoperative narcotics, change in appetite and fluid intake all can  affect your bowels.  In order to avoid complications following surgery, here are some recommendations in order to help you during your recovery period.  Colace (docusate) - Pick up an over-the-counter form of Colace or another stool softener and take twice a day as long as you are requiring postoperative pain medications.  Take with a full glass of water daily.  If you experience loose stools or diarrhea, hold the colace until you stool forms back up.  If your symptoms do not get better within 1 week or if they get worse, check with your doctor. Dulcolax (bisacodyl) - Pick up over-the-counter and take as directed by the product packaging as needed to assist with the movement of your bowels.  Take with a full glass of water.  Use this product as needed if not relieved by Colace only.  MiraLax (polyethylene glycol) - Pick up over-the-counter to have on hand.  MiraLax is a solution that will increase the amount of water in your bowels to assist with bowel movements.  Take as directed and can mix with a glass of water, juice, soda, coffee, or tea.  Take if you go more than two days without a movement.Do not use MiraLax more than once per day. Call your doctor if you are still constipated or irregular after using this medication for 7 days in a row.  If you continue to have problems with postoperative constipation, please contact the office for further assistance and recommendations.  If you experience "the worst abdominal pain ever" or develop nausea or vomiting, please contact the office immediatly for further recommendations for treatment.  ITCHING  If you experience itching with your medications, try taking only a single pain pill, or even half a pain pill at a time.  You can also use Benadryl over the counter for itching or also to help with sleep.   MEDICATIONS See your medication summary on the "After Visit Summary" that the nursing staff will review with you prior to discharge.  You may have some home  medications which will be placed on hold until you complete the course of blood thinner medication.  It is important for you to complete the blood thinner medication as prescribed by your surgeon.  Continue your approved medications as instructed at time of discharge.  PRECAUTIONS If you experience chest pain or shortness of breath - call 911 immediately for transfer to the hospital emergency department.  If you develop a fever greater that 101 F, purulent drainage from wound, increased redness or drainage from wound, foul odor from the wound/dressing, or calf pain - CONTACT YOUR SURGEON.                                                   FOLLOW-UP APPOINTMENTS Make sure you keep all of your appointments after your operation with your surgeon and caregivers. You should call the office at the above phone number and make an appointment for approximately two weeks after the date of your surgery or on the   date instructed by your surgeon outlined in the "After Visit Summary".  RANGE OF MOTION AND STRENGTHENING EXERCISES  These exercises are designed to help you keep full movement of your hip joint. Follow your caregiver's or physical therapist's instructions. Perform all exercises about fifteen times, three times per day or as directed. Exercise both hips, even if you have had only one joint replacement. These exercises can be done on a training (exercise) mat, on the floor, on a table or on a bed. Use whatever works the best and is most comfortable for you. Use music or television while you are exercising so that the exercises are a pleasant break in your day. This will make your life better with the exercises acting as a break in routine you can look forward to.  Lying on your back, slowly slide your foot toward your buttocks, raising your knee up off the floor. Then slowly slide your foot back down until your leg is straight again.  Lying on your back spread your legs as far apart as you can without causing  discomfort.  Lying on your side, raise your upper leg and foot straight up from the floor as far as is comfortable. Slowly lower the leg and repeat.  Lying on your back, tighten up the muscle in the front of your thigh (quadriceps muscles). You can do this by keeping your leg straight and trying to raise your heel off the floor. This helps strengthen the largest muscle supporting your knee.  Lying on your back, tighten up the muscles of your buttocks both with the legs straight and with the knee bent at a comfortable angle while keeping your heel on the floor.   POST-OPERATIVE OPIOID TAPER INSTRUCTIONS: It is important to wean off of your opioid medication as soon as possible. If you do not need pain medication after your surgery it is ok to stop day one. Opioids include: Codeine, Hydrocodone(Norco, Vicodin), Oxycodone(Percocet, oxycontin) and hydromorphone amongst others.  Long term and even short term use of opiods can cause: Increased pain response Dependence Constipation Depression Respiratory depression And more.  Withdrawal symptoms can include Flu like symptoms Nausea, vomiting And more Techniques to manage these symptoms Hydrate well Eat regular healthy meals Stay active Use relaxation techniques(deep breathing, meditating, yoga) Do Not substitute Alcohol to help with tapering If you have been on opioids for less than two weeks and do not have pain than it is ok to stop all together.  Plan to wean off of opioids This plan should start within one week post op of your joint replacement. Maintain the same interval or time between taking each dose and first decrease the dose.  Cut the total daily intake of opioids by one tablet each day Next start to increase the time between doses. The last dose that should be eliminated is the evening dose.   IF YOU ARE TRANSFERRED TO A SKILLED REHAB FACILITY If the patient is transferred to a skilled rehab facility following release from the  hospital, a list of the current medications will be sent to the facility for the patient to continue.  When discharged from the skilled rehab facility, please have the facility set up the patient's Home Health Physical Therapy prior to being released. Also, the skilled facility will be responsible for providing the patient with their medications at time of release from the facility to include their pain medication, the muscle relaxants, and their blood thinner medication. If the patient is still at the rehab facility   up appointment, the skilled rehab facility will also need to assist the patient in arranging follow up appointment in our office and any transportation needs.  MAKE SURE YOU:  Understand these instructions.  Get help right away if you are not doing well or get worse.    DENTAL ANTIBIOTICS:  In most cases prophylactic antibiotics for Dental procdeures after total joint surgery are not necessary.  Exceptions are as follows:  1. History of prior total joint infection  2. Severely immunocompromised (Organ Transplant, cancer chemotherapy, Rheumatoid biologic meds such as Petrolia)  3. Poorly controlled diabetes (A1C &gt; 8.0, blood glucose over 200)  If you have one of these conditions, contact your surgeon for an antibiotic prescription, prior to your dental procedure.    Pick up stool softner and laxative for home use following surgery while on pain medications. Do not submerge incision under water. Please use good hand washing techniques while changing dressing each day. May shower starting three days after surgery. Please use a clean towel to pat the incision dry following showers. Continue to use ice for pain and swelling after surgery. Do not use any lotions or creams on the incision until instructed by your surgeon.  Information on my medicine - XARELTO (Rivaroxaban)    Why was Xarelto prescribed for you? Xarelto was prescribed for you to reduce the risk of blood  clots forming after orthopedic surgery. The medical term for these abnormal blood clots is venous thromboembolism (VTE).  What do you need to know about xarelto ? Take your Xarelto ONCE DAILY at the same time every day. You may take it either with or without food.  If you have difficulty swallowing the tablet whole, you may crush it and mix in applesauce just prior to taking your dose.  Take Xarelto exactly as prescribed by your doctor and DO NOT stop taking Xarelto without talking to the doctor who prescribed the medication.  Stopping without other VTE prevention medication to take the place of Xarelto may increase your risk of developing a clot.  After discharge, you should have regular check-up appointments with your healthcare provider that is prescribing your Xarelto.    What do you do if you miss a dose? If you miss a dose, take it as soon as you remember on the same day then continue your regularly scheduled once daily regimen the next day. Do not take two doses of Xarelto on the same day.   Important Safety Information A possible side effect of Xarelto is bleeding. You should call your healthcare provider right away if you experience any of the following: Bleeding from an injury or your nose that does not stop. Unusual colored urine (red or dark brown) or unusual colored stools (red or black). Unusual bruising for unknown reasons. A serious fall or if you hit your head (even if there is no bleeding).  Some medicines may interact with Xarelto and might increase your risk of bleeding while on Xarelto. To help avoid this, consult your healthcare provider or pharmacist prior to using any new prescription or non-prescription medications, including herbals, vitamins, non-steroidal anti-inflammatory drugs (NSAIDs) and supplements.  This website has more information on Xarelto: [email or web address]

## 2023-08-09 NOTE — Op Note (Signed)
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   POSTOPERATIVE DIAGNOSIS: Osteoarthritis of the Right  hip.   PROCEDURE: Right total hip arthroplasty, anterior approach.   SURGEON: Ollen Gross, MD   ASSISTANT: Rene Paci, PA-C  ANESTHESIA:  Spinal  ESTIMATED BLOOD LOSS:-350 mL    DRAINS: None  COMPLICATIONS: None   CONDITION: PACU - hemodynamically stable.   BRIEF CLINICAL NOTE: Daniel Colon is a 77 y.o. male who has advanced end-  stage arthritis of their Left  hip with progressively worsening pain and  dysfunction.The patient has failed nonoperative management and presents for  total hip arthroplasty.   PROCEDURE IN DETAIL: After successful administration of spinal  anesthetic, the traction boots for the [facility] bed were placed on both  feet and the patient was placed onto the [facility] bed, boots placed into the leg  holders. The Left hip was then isolated from the perineum with plastic  drapes and prepped and draped in the usual sterile fashion. ASIS and  greater trochanter were marked and a oblique incision was made, starting  at about 1 cm lateral and 2 cm distal to the ASIS and coursing towards  the anterior cortex of the femur. The skin was cut with a 10 blade  through subcutaneous tissue to the level of the fascia overlying the  tensor fascia lata muscle. The fascia was then incised in line with the  incision at the junction of the anterior third and posterior 2/3rd. The  muscle was teased off the fascia and then the interval between the TFL  and the rectus was developed. The Hohmann retractor was then placed at  the top of the femoral neck over the capsule. The vessels overlying the  capsule were cauterized and the fat on top of the capsule was removed.  A Hohmann retractor was then placed anterior underneath the rectus  femoris to give exposure to the entire anterior capsule. A T-shaped  capsulotomy was  performed. The edges were tagged and the femoral head  was identified.       Osteophytes are removed off the superior acetabulum.  The femoral neck was then cut in situ with an oscillating saw. Traction  was then applied to the left lower extremity utilizing the [facility]  traction. The femoral head was then removed. Retractors were placed  around the acetabulum and then circumferential removal of the labrum was  performed. Osteophytes were also removed. Reaming starts at 49 mm to  medialize and  Increased in 2 mm increments to 53 mm. We reamed in  approximately 40 degrees of abduction, 20 degrees anteversion. A 54 mm  pinnacle acetabular shell was then impacted in anatomic position under  fluoroscopic guidance with excellent purchase. We did not need to place  any additional dome screws. A 36 mm neutral + 4 marathon liner was then  placed into the acetabular shell.       The femoral lift was then placed along the lateral aspect of the femur  just distal to the vastus ridge. The leg was  externally rotated and capsule  was stripped off the inferior aspect of the femoral neck down to the  level of the lesser trochanter, this was done with electrocautery. The femur was lifted after this was performed. The  leg was then placed in an extended and adducted position essentially delivering the femur. We also removed the capsule superiorly and the piriformis from the piriformis fossa to  gain excellent exposure of the  proximal femur. Rongeur was used to remove some cancellous bone to get  into the lateral portion of the proximal femur for placement of the  initial starter reamer. The starter broaches was placed  the starter broach  and was shown to go down the center of the canal. Broaching  with the Actis system was then performed starting at size 0  coursing  Up to size 4. A size 4 had excellent torsional and rotational  and axial stability. The trial standard offset neck was then placed  with a 36 +  1.5  trial head. The hip was then reduced. We confirmed that  the stem was in the canal both on AP and lateral x-rays. It also has excellent sizing. The hip was reduced with outstanding stability through full extension and full external rotation.. AP pelvis was taken and the leg lengths were measured and found to be equal. Hip was then dislocated again and the femoral head and neck removed. The  femoral broach was removed. Size 4 Actis stem with a standard offset  neck was then impacted into the femur following native anteversion. Has  excellent purchase in the canal. Excellent torsional and rotational and  axial stability. It is confirmed to be in the canal on AP and lateral  fluoroscopic views. The 36 + 1.5 metal head was placed and the hip  reduced with outstanding stability. Again AP pelvis was taken and it  confirmed that the leg lengths were equal. The wound was then copiously  irrigated with saline solution and the capsule reattached and repaired  with Ethibond suture. 30 ml of .25% Bupivicaine was  injected into the capsule and into the edge of the tensor fascia lata as well as subcutaneous tissue. The fascia overlying the tensor fascia lata was then closed with a running #1 V-Loc. Subcu was closed with interrupted 2-0 Vicryl and subcuticular running 4-0 Monocryl. Incision was cleaned  and dried. Steri-Strips and a bulky sterile dressing applied. The patient was awakened and transported to  recovery in stable condition.        Please note that a surgical assistant was a medical necessity for this procedure to perform it in a safe and expeditious manner. Assistant was necessary to provide appropriate retraction of vital neurovascular structures and to prevent femoral fracture and allow for anatomic placement of the prosthesis.  Ollen Gross, M.D.

## 2023-08-09 NOTE — Anesthesia Postprocedure Evaluation (Signed)
Anesthesia Post Note  Patient: Daniel Colon  Procedure(s) Performed: TOTAL HIP ARTHROPLASTY ANTERIOR APPROACH (Right: Hip)     Patient location during evaluation: PACU Anesthesia Type: Spinal Level of consciousness: oriented and awake and alert Pain management: pain level controlled Vital Signs Assessment: post-procedure vital signs reviewed and stable Respiratory status: spontaneous breathing, respiratory function stable and patient connected to nasal cannula oxygen Cardiovascular status: blood pressure returned to baseline and stable Postop Assessment: no headache, no backache, no apparent nausea or vomiting and spinal receding Anesthetic complications: no  No notable events documented.  Last Vitals:  Vitals:   08/09/23 1530 08/09/23 1545  BP: 119/68 (!) 117/56  Pulse: 64 69  Resp: 17 16  Temp:  36.5 C  SpO2: 100% 100%    Last Pain:  Vitals:   08/09/23 1545  TempSrc:   PainSc: 0-No pain                 Shelton Silvas

## 2023-08-09 NOTE — Care Plan (Signed)
Ortho Bundle Case Management Note  Patient Details  Name: Daniel Colon MRN: [id number] Date of Birth: May 13, 1946                  R THA on 08-09-23  DCP: Home with wife  DMe: No needs, has a RW  PT: [facility]   DME Arranged:  N/A DME Agency:       Additional Comments: Please contact me with any questions of if this plan should need to change.   Ennis Forts, RN,CCM [facility]  [phone] 08/09/2023, 1:22 PM

## 2023-08-09 NOTE — Interval H&P Note (Signed)
History and Physical Interval Note:  08/09/2023 10:49 AM  Daniel Colon  has presented today for surgery, with the diagnosis of right hip osteoarthritis.  The various methods of treatment have been discussed with the patient and family. After consideration of risks, benefits and other options for treatment, the patient has consented to  Procedure(s): TOTAL HIP ARTHROPLASTY ANTERIOR APPROACH (Right) as a surgical intervention.  The patient's history has been reviewed, patient examined, no change in status, stable for surgery.  I have reviewed the patient's chart and labs.  Questions were answered to the patient's satisfaction.     [name]

## 2023-08-09 NOTE — Evaluation (Signed)
Physical Therapy Evaluation Patient Details Name: Daniel Colon MRN: [id number] DOB: 07-02-1946 Today's Date: 08/09/2023  History of Present Illness  77 yo male presents to therapy s/p R THA, anterior approach on 08/09/2023 due to failure of conservative measures. Pt PMH includes but is not limited to: s/p CABG x 4, CAD, unstable angina, SOB, COPD, lung ca-s/p lobectomy an chemotherapy, HTN, HLD, GERD, barrett's esophagus, and MI.  Clinical Impression    Daniel Colon Case is a 77 y.o. male POD 0 s/p R THA. Patient reports mod I with mobility at baseline. Patient is now limited by functional impairments (see PT problem list below) and requires CGA for bed mobility and CGA for transfers. Patient was able to ambulate 50 feet with RW and CGA level of assist. Patient instructed in exercise to facilitate ROM and circulation to manage edema. Patient will benefit from continued skilled PT interventions to address impairments and progress towards PLOF. Acute PT will follow to progress mobility and stair training in preparation for safe discharge home with family support and HEP.       If plan is discharge home, recommend the following: A little help with walking and/or transfers;A little help with bathing/dressing/bathroom;Assistance with cooking/housework;Assist for transportation;Help with stairs or ramp for entrance   Can travel by private vehicle        Equipment Recommendations Rolling walker (2 wheels) (only has standard)  Recommendations for Other Services       Functional Status Assessment Patient has had a recent decline in their functional status and demonstrates the ability to make significant improvements in function in a reasonable and predictable amount of time.     Precautions / Restrictions Precautions Precautions: Fall Restrictions Weight Bearing Restrictions: Yes RLE Weight Bearing: Weight bearing as tolerated      Mobility  Bed Mobility Overal bed mobility: Needs  Assistance Bed Mobility: Supine to Sit     Supine to sit: Contact guard, HOB elevated     General bed mobility comments: min cues    Transfers Overall transfer level: Needs assistance Equipment used: Rolling walker (2 wheels) Transfers: Sit to/from Stand Sit to Stand: Contact guard assist           General transfer comment: min cues for proper UE placement    Ambulation/Gait Ambulation/Gait assistance: Contact guard assist Gait Distance (Feet): 50 Feet Assistive device: Rolling walker (2 wheels) Gait Pattern/deviations: Step-to pattern, Antalgic Gait velocity: decreased     General Gait Details: pt indicated increased R hip pain when attempting to progress to larger stride length on R side for almost step through pattern, pt encouraged to take smaller steps at this time and will be able to progress to step through pattern at a later date pending pain  Stairs            Wheelchair Mobility     Tilt Bed    Modified Rankin (Stroke Patients Only)       Balance                                             Pertinent Vitals/Pain Pain Assessment Pain Assessment: 0-10 Pain Score: 5  Pain Location: R hip Pain Descriptors / Indicators: Aching, Contraction, Discomfort, Operative site guarding Pain Intervention(s): Limited activity within patient's tolerance, Monitored during session, Premedicated before session, Repositioned, Ice applied    Home Living Family/patient expects to be  discharged to:: Private residence Living Arrangements: Spouse/significant other Available Help at Discharge: Family Type of Home: House Home Access: Stairs to enter Entrance Stairs-Rails: Doctor, general practice of Steps: 3   Home Layout: Multi-level;Laundry or work area in basement;Able to live on main level with bedroom/bathroom Home Equipment: Engineer, materials - single point      Prior Function Prior Level of Function : Independent/Modified  Independent             Mobility Comments: pt indicates occational use of SPC for prolonged community distances, IND with all ADLs, self care tasks       Extremity/Trunk Assessment        Lower Extremity Assessment Lower Extremity Assessment: RLE deficits/detail RLE Deficits / Details: ankle DF/PF 5/5 RLE Sensation: WNL    Cervical / Trunk Assessment Cervical / Trunk Assessment:  (wfl)  Communication      Cognition Arousal: Alert Behavior During Therapy: WFL for tasks assessed/performed Overall Cognitive Status: Within Functional Limits for tasks assessed                                          General Comments      Exercises Total Joint Exercises Ankle Circles/Pumps: AROM, Both, 20 reps   Assessment/Plan    PT Assessment Patient needs continued PT services  PT Problem List Decreased strength;Decreased activity tolerance;Decreased balance;Decreased mobility;Decreased coordination;Decreased cognition;Pain       PT Treatment Interventions DME instruction;Gait training;Stair training;Functional mobility training;Therapeutic exercise;Therapeutic activities;Balance training;Neuromuscular re-education;Patient/family education;Modalities    PT Goals (Current goals can be found in the Care Plan section)  Acute Rehab PT Goals Patient Stated Goal: to be able to hit the golf ball further down the fairway PT Goal Formulation: With patient Time For Goal Achievement: 08/23/23 Potential to Achieve Goals: Good    Frequency 7X/week     Co-evaluation               AM-PAC PT "6 Clicks" Mobility  Outcome Measure Help needed turning from your back to your side while in a flat bed without using bedrails?: A Little Help needed moving from lying on your back to sitting on the side of a flat bed without using bedrails?: A Little Help needed moving to and from a bed to a chair (including a wheelchair)?: A Little Help needed standing up from a chair  using your arms (e.g., wheelchair or bedside chair)?: A Little Help needed to walk in hospital room?: A Little Help needed climbing 3-5 steps with a railing? : A Lot 6 Click Score: 17    End of Session Equipment Utilized During Treatment: Gait belt Activity Tolerance: Patient tolerated treatment well (minimal increase in pain) Patient left: in chair;with call bell/phone within reach;with family/visitor present Nurse Communication: Mobility status PT Visit Diagnosis: Unsteadiness on feet (R26.81);Muscle weakness (generalized) (M62.81);Pain;Difficulty in walking, not elsewhere classified (R26.2) Pain - Right/Left: Right Pain - part of body: Hip;Leg    Time: 0630-1601 PT Time Calculation (min) (ACUTE ONLY): 24 min   Charges:   PT Evaluation $PT Eval Low Complexity: 1 Low PT Treatments $Gait Training: 8-22 mins PT General Charges $$ ACUTE PT VISIT: 1 Visit         Johnny Bridge, PT Acute Rehab   Jacqualyn Posey 08/09/2023, 6:17 PM

## 2023-08-10 ENCOUNTER — Encounter (HOSPITAL_COMMUNITY): Payer: Self-pay | Admitting: Orthopedic Surgery

## 2023-08-10 DIAGNOSIS — I10 Essential (primary) hypertension: Secondary | ICD-10-CM | POA: Diagnosis not present

## 2023-08-10 DIAGNOSIS — Z7982 Long term (current) use of aspirin: Secondary | ICD-10-CM | POA: Diagnosis not present

## 2023-08-10 DIAGNOSIS — I2510 Atherosclerotic heart disease of native coronary artery without angina pectoris: Secondary | ICD-10-CM | POA: Diagnosis not present

## 2023-08-10 DIAGNOSIS — Z79899 Other long term (current) drug therapy: Secondary | ICD-10-CM | POA: Diagnosis not present

## 2023-08-10 DIAGNOSIS — Z951 Presence of aortocoronary bypass graft: Secondary | ICD-10-CM | POA: Diagnosis not present

## 2023-08-10 DIAGNOSIS — M1611 Unilateral primary osteoarthritis, right hip: Secondary | ICD-10-CM | POA: Diagnosis not present

## 2023-08-10 LAB — CBC
HCT: 33 % — ABNORMAL LOW (ref 39.0–52.0)
Hemoglobin: 10.3 g/dL — ABNORMAL LOW (ref 13.0–17.0)
MCH: 25.8 pg — ABNORMAL LOW (ref 26.0–34.0)
MCHC: 31.2 g/dL (ref 30.0–36.0)
MCV: 82.5 fL (ref 80.0–100.0)
Platelets: 164 10*3/uL (ref 150–400)
RBC: 4 MIL/uL — ABNORMAL LOW (ref 4.22–5.81)
RDW: 17.2 % — ABNORMAL HIGH (ref 11.5–15.5)
WBC: 14.6 10*3/uL — ABNORMAL HIGH (ref 4.0–10.5)
nRBC: 0 % (ref 0.0–0.2)

## 2023-08-10 LAB — BASIC METABOLIC PANEL
Anion gap: 9 (ref 5–15)
BUN: 19 mg/dL (ref 8–23)
CO2: 24 mmol/L (ref 22–32)
Calcium: 8 mg/dL — ABNORMAL LOW (ref 8.9–10.3)
Chloride: 99 mmol/L (ref 98–111)
Creatinine, Ser: 1.07 mg/dL (ref 0.61–1.24)
GFR, Estimated: >60 mL/min (ref >60)
Glucose, Bld: 136 mg/dL — ABNORMAL HIGH (ref 70–99)
Potassium: 3.3 mmol/L — ABNORMAL LOW (ref 3.5–5.1)
Sodium: 132 mmol/L — ABNORMAL LOW (ref 135–145)

## 2023-08-10 MED ORDER — RIVAROXABAN 10 MG PO TABS: 10.0000 mg | ORAL_TABLET | Freq: Every day | ORAL | 0 refills | Status: DC

## 2023-08-10 MED ORDER — METHOCARBAMOL 500 MG PO TABS: 500.0000 mg | ORAL_TABLET | Freq: Four times a day (QID) | ORAL | 0 refills | Status: DC | PRN

## 2023-08-10 MED ORDER — TRAMADOL HCL 50 MG PO TABS: 50.0000 mg | ORAL_TABLET | Freq: Four times a day (QID) | ORAL | 0 refills | Status: DC | PRN

## 2023-08-10 MED ORDER — HYDROCODONE-ACETAMINOPHEN 5-325 MG PO TABS: 1.0000 | ORAL_TABLET | Freq: Four times a day (QID) | ORAL | 0 refills | Status: DC | PRN

## 2023-08-10 NOTE — TOC Transition Note (Signed)
Transition of Care [facility]) - CM/SW Discharge Note   Patient Details  Name: Daniel Colon MRN: [id number] Date of Birth: 05-Nov-1946  Transition of Care [facility]) CM/SW Contact:  Amada Jupiter, LCSW Phone Number: 08/10/2023, 9:43 AM   Clinical Narrative:     Met briefly with pt who confirms he has needed DME in the home.  Plan for HEP.  No TOC needs.  Final next level of care: Home/Self Care Barriers to Discharge: No Barriers Identified   Patient Goals and CMS Choice      Discharge Placement                         Discharge Plan and Services Additional resources added to the After Visit Summary for                  DME Arranged: N/A                    Social Determinants of Health (SDOH) Interventions SDOH Screenings   Food Insecurity: No Food Insecurity (08/09/2023)  Housing: Patient Declined (08/09/2023)  Transportation Needs: No Transportation Needs (08/09/2023)  Utilities: Not At Risk (08/09/2023)  Depression (PHQ2-9): Low Risk  (08/08/2020)  Tobacco Use: Medium Risk (08/09/2023)     Readmission Risk Interventions    05/02/2023   10:50 AM  Readmission Risk Prevention Plan  Transportation Screening Complete  PCP or Specialist Appt within 5-7 Days Complete  Home Care Screening Complete  Medication Review (RN CM) Complete

## 2023-08-10 NOTE — Plan of Care (Signed)
Problem: Education: Goal: Will demonstrate proper wound care and an understanding of methods to prevent future damage Outcome: Progressing   Problem: Activity: Goal: Risk for activity intolerance will decrease Outcome: Progressing   Problem: Clinical Measurements: Goal: Postoperative complications will be avoided or minimized Outcome: Progressing   Problem: Respiratory: Goal: Respiratory status will improve Outcome: Progressing   Problem: Pain Managment: Goal: General experience of comfort will improve Outcome: Progressing   Problem: Safety: Goal: Ability to remain free from injury will improve Outcome: Progressing   Haydee Salter, RN 08/10/23 10:08 AM

## 2023-08-10 NOTE — Plan of Care (Signed)
Patient discharging home via private vehicle with wife. Haydee Salter, RN 08/10/23 2:54 PM

## 2023-08-10 NOTE — Plan of Care (Signed)
  Problem: Education: Goal: Knowledge of the prescribed therapeutic regimen will improve Outcome: Progressing Goal: Understanding of discharge needs will improve Outcome: Progressing Goal: Individualized Educational Video(s) Outcome: Completed/Met   Problem: Activity: Goal: Ability to avoid complications of mobility impairment will improve Outcome: Adequate for Discharge Goal: Ability to tolerate increased activity will improve Outcome: Adequate for Discharge   Problem: Clinical Measurements: Goal: Postoperative complications will be avoided or minimized Outcome: Progressing   Problem: Pain Management: Goal: Pain level will decrease with appropriate interventions Outcome: Progressing   Problem: Skin Integrity: Goal: Will show signs of wound healing Outcome: Progressing   Problem: Education: Goal: Knowledge of General Education information will improve Description: Including pain rating scale, medication(s)/side effects and non-pharmacologic comfort measures Outcome: Progressing   Problem: Health Behavior/Discharge Planning: Goal: Ability to manage health-related needs will improve Outcome: Progressing   Problem: Clinical Measurements: Goal: Ability to maintain clinical measurements within normal limits will improve Outcome: Progressing Goal: Will remain free from infection Outcome: Progressing Goal: Diagnostic test results will improve Outcome: Progressing Goal: Respiratory complications will improve Outcome: Progressing Goal: Cardiovascular complication will be avoided Outcome: Progressing   Problem: Activity: Goal: Risk for activity intolerance will decrease Outcome: Adequate for Discharge   Problem: Nutrition: Goal: Adequate nutrition will be maintained Outcome: Completed/Met   Problem: Coping: Goal: Level of anxiety will decrease Outcome: Progressing   Problem: Elimination: Goal: Will not experience complications related to bowel motility Outcome:  Progressing Goal: Will not experience complications related to urinary retention Outcome: Progressing   Problem: Pain Managment: Goal: General experience of comfort will improve Outcome: Progressing   Problem: Safety: Goal: Ability to remain free from injury will improve Outcome: Progressing   Problem: Skin Integrity: Goal: Risk for impaired skin integrity will decrease Outcome: Progressing

## 2023-08-10 NOTE — Progress Notes (Signed)
Physical Therapy Treatment Patient Details Name: Daniel Colon MRN: [id number] DOB: 06-Jun-1946 Today's Date: 08/10/2023   History of Present Illness 77 yo male presents to therapy s/p R THA, anterior approach on 08/09/2023 due to failure of conservative measures. Pt PMH includes but is not limited to: s/p CABG x 4, CAD, unstable angina, SOB, COPD, lung ca-s/p lobectomy an chemotherapy, HTN, HLD, GERD, barrett's esophagus, and MI.    PT Comments  POD # 1 am session Assisted OOB with increased time.  General bed mobility comments: demonstarted and instructed how to use a belt to self assist LE.  General Gait Details: tolerated a functional distance with Spouse "hands on" using a safety belt VC's on safety with turns. Practiced 2 steps using one rail with B UE's with Spouse.  General stair comments: VC's on proper tech/sequencing with Spouse "hands on" using Safety belt. Then returned to room to perform some TE's following HEP handout.  Instructed on proper tech, freq as well as use of ICE.   Addressed all mobility questions, discussed appropriate activity, educated on use of ICE.  Pt ready for D/C to home but has yet to void.  Reported to RN.      If plan is discharge home, recommend the following: A little help with walking and/or transfers;A little help with bathing/dressing/bathroom;Assistance with cooking/housework;Assist for transportation;Help with stairs or ramp for entrance   Can travel by private vehicle        Equipment Recommendations  Rolling walker (2 wheels)    Recommendations for Other Services       Precautions / Restrictions Precautions Precautions: Fall Restrictions Weight Bearing Restrictions: No RLE Weight Bearing: Weight bearing as tolerated     Mobility  Bed Mobility Overal bed mobility: Needs Assistance Bed Mobility: Supine to Sit     Supine to sit: Contact guard, HOB elevated     General bed mobility comments: demonstarted and instructed how to use a  belt to self assist LE    Transfers Overall transfer level: Needs assistance Equipment used: Rolling walker (2 wheels) Transfers: Sit to/from Stand Sit to Stand: Contact guard assist           General transfer comment: VC's on safety with turns    Ambulation/Gait Ambulation/Gait assistance: Contact guard assist, Supervision Gait Distance (Feet): 85 Feet Assistive device: Rolling walker (2 wheels) Gait Pattern/deviations: Step-to pattern, Antalgic Gait velocity: decreased     General Gait Details: tolerated a functional distance with Spouse "hands on" using a safety belt VC's on safety with turns.   Stairs Stairs: Yes Stairs assistance: Supervision, Contact guard assist Stair Management: One rail Right, Step to pattern, Forwards Number of Stairs: 2 General stair comments: VC's on proper tech/sequencing with Spouse "hands on" using Safety belt.   Wheelchair Mobility     Tilt Bed    Modified Rankin (Stroke Patients Only)       Balance                                            Cognition Arousal: Alert Behavior During Therapy: WFL for tasks assessed/performed Overall Cognitive Status: Within Functional Limits for tasks assessed                                 General Comments: AxO x 3 pleasant and motivated  Exercises  Total Hip Replacement TE's following HEP Handout 10 reps ankle pumps 05 reps knee presses 05 reps heel slides 05 reps SAQ's 05 reps ABD Instructed how to use a belt loop to assist  Followed by ICE     General Comments        Pertinent Vitals/Pain Pain Assessment Pain Assessment: 0-10 Pain Score: 7  Pain Location: R hip Pain Descriptors / Indicators: Aching, Contraction, Discomfort, Operative site guarding Pain Intervention(s): Monitored during session, Repositioned, Ice applied, Premedicated before session    Home Living                          Prior Function             PT Goals (current goals can now be found in the care plan section) Progress towards PT goals: Progressing toward goals    Frequency    7X/week      PT Plan      Co-evaluation              AM-PAC PT "6 Clicks" Mobility   Outcome Measure  Help needed turning from your back to your side while in a flat bed without using bedrails?: A Little Help needed moving from lying on your back to sitting on the side of a flat bed without using bedrails?: A Little Help needed moving to and from a bed to a chair (including a wheelchair)?: A Little Help needed standing up from a chair using your arms (e.g., wheelchair or bedside chair)?: A Little Help needed to walk in hospital room?: A Little Help needed climbing 3-5 steps with a railing? : A Little 6 Click Score: 18    End of Session Equipment Utilized During Treatment: Gait belt Activity Tolerance: Patient tolerated treatment well Patient left: in chair;with call bell/phone within reach;with family/visitor present Nurse Communication: Mobility status PT Visit Diagnosis: Unsteadiness on feet (R26.81);Muscle weakness (generalized) (M62.81);Pain;Difficulty in walking, not elsewhere classified (R26.2) Pain - Right/Left: Right Pain - part of body: Hip;Leg     Time: 1000-1040 PT Time Calculation (min) (ACUTE ONLY): 40 min  Charges:    $Gait Training: 8-22 mins $Therapeutic Exercise: 8-22 mins $Therapeutic Activity: 8-22 mins PT General Charges $$ ACUTE PT VISIT: 1 Visit                     Felecia Shelling  PTA Acute  Rehabilitation Services Office M-F          [phone]

## 2023-08-10 NOTE — Progress Notes (Signed)
   Subjective: 1 Day Post-Op Procedure(s) (LRB): TOTAL HIP ARTHROPLASTY ANTERIOR APPROACH (Right) Patient reports pain as mild.   Patient seen in rounds by Dr. Lequita Halt. Patient is well, and has had no acute complaints or problems. Denies chest pain or SOB. No issues overnight. Foley catheter removed this AM. We will continue therapy today, ambulated 50' yesterday.   Objective: Vital signs in last 24 hours: Temp:  [97.7 F (36.5 C)-98.8 F (37.1 C)] 98.7 F (37.1 C) (08/20 0543) Pulse Rate:  [63-72] 65 (08/20 0543) Resp:  [16-20] 16 (08/20 0543) BP: (104-151)/(55-79) 116/62 (08/20 0543) SpO2:  [91 %-100 %] 92 % (08/20 0543) Weight:  [81.2 kg] 81.2 kg (08/19 1612)  Intake/Output from previous day:  Intake/Output Summary (Last 24 hours) at 08/10/2023 0722 Last data filed at 08/10/2023 0600 Gross per 24 hour  Intake 3263.07 ml  Output 1800 ml  Net 1463.07 ml     Intake/Output this shift: No intake/output data recorded.  Labs: Recent Labs    08/10/23 0335  HGB 10.3*   Recent Labs    08/10/23 0335  WBC 14.6*  RBC 4.00*  HCT 33.0*  PLT 164   Recent Labs    08/10/23 0335  NA 132*  K 3.3*  CL 99  CO2 24  BUN 19  CREATININE 1.07  GLUCOSE 136*  CALCIUM 8.0*   No results for input(s): "LABPT", "INR" in the last 72 hours.  Exam: General - Patient is Alert, Appropriate, and Oriented Extremity - Neurovascular intact Dorsiflexion/Plantar flexion intact Dressing - dressing C/D/I Motor Function - intact, moving foot and toes well on exam.   Past Medical History:  Diagnosis Date   Allergy    Arthritis    Barrett's esophagus    CAD (coronary artery disease)    a. Stent to the RCA 1996 (in [place]);  b. ETT(11/13/13): abnormal with early ST changes to suggest ischemia;   c. LHC (11/17/13):  pLAD 30-40, D1 30, mLAD 70-75, mCFX 70, mRCA stent 40 (ISR), EF 55-65%.  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   Weight as of this encounter: 81.2 kg. Up with therapy  DVT Prophylaxis - Xarelto Weight bearing as tolerated. Continue therapy.  Plan is to go Home after hospital stay. Plan for discharge with HEP later today if progresses with therapy and meeting goals. Follow-up in the office in 2 weeks.  The PDMP database was reviewed today prior to any opioid medications being prescribed to this patient.  Weston Brass, PA-C Orthopedic Surgery 08/10/2023, 7:22 AM

## 2023-08-11 ENCOUNTER — Encounter: Payer: Self-pay | Admitting: Cardiology

## 2023-08-12 NOTE — Discharge Summary (Signed)
Physician Discharge Summary   Patient ID: Daniel Colon MRN: [id number] DOB/AGE: 75-Jul-1947 75 y.o.  Admit date: 08/09/2023 Discharge date: 08/10/2023  Primary Diagnosis: Right hip osteoarthritis   Admission Diagnoses:  Past Medical History:  Diagnosis Date   Allergy    Arthritis    Barrett's esophagus    CAD (coronary artery disease)    a. Stent to the RCA 1996 (in [place]);  b. ETT(11/13/13): abnormal with early ST changes to suggest ischemia;   c. LHC (11/17/13):  pLAD 30-40, D1 30, mLAD 70-75, mCFX 70, mRCA stent 40 (ISR), EF 55-65%.  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   Weight as of this encounter: 81.2 kg.  Procedure:  Procedure(s) (LRB): TOTAL HIP ARTHROPLASTY ANTERIOR APPROACH (Right)   Consults: None  HPI: Daniel Colon is a 77 y.o. male who has advanced end-  stage arthritis of their Left  hip with progressively worsening pain and  dysfunction.The patient has failed nonoperative management and presents for  total hip arthroplasty.   Laboratory Data: Admission on 08/09/2023, Discharged on 08/10/2023  Component Date Value Ref Range Status   WBC 08/10/2023 14.6 (H)  4.0 - 10.5 K/uL Final   RBC 08/10/2023 4.00 (L)  4.22 - 5.81 MIL/uL Final   Hemoglobin 08/10/2023 10.3 (L)  13.0 - 17.0 g/dL Final   HCT 78/29/5621  33.0 (L)  39.0 - 52.0 % Final   MCV 08/10/2023 82.5  80.0 - 100.0 fL Final   MCH 08/10/2023 25.8 (L)  26.0 - 34.0 pg Final   MCHC 08/10/2023 31.2  30.0 - 36.0 g/dL Final   RDW 30/86/5784 17.2 (H)  11.5 - 15.5 % Final   Platelets 08/10/2023 164  150 - 400 K/uL Final   nRBC 08/10/2023 0.0  0.0 - 0.2 % Final   Performed at [facility], [address] [place], [place] [address]   Sodium 08/10/2023 132 (L)  135 - 145 mmol/L Final   Potassium 08/10/2023 3.3 (L)  3.5 - 5.1 mmol/L Final   Chloride 08/10/2023 99  98 - 111 mmol/L Final   CO2 08/10/2023 24  22 - 32 mmol/L Final   Glucose, Bld 08/10/2023 136 (H)  70 - 99 mg/dL Final   Glucose reference range applies only to samples taken after fasting for at least 8 hours.   BUN 08/10/2023 19  8 - 23 mg/dL Final   Creatinine, Ser 08/10/2023 1.07  0.61 - 1.24 mg/dL Final   Calcium 52/84/1324 8.0 (L)  8.9 - 10.3 mg/dL Final   GFR, Estimated 08/10/2023 >60  >60 mL/min Final   Comment: (NOTE) Calculated using the CKD-EPI Creatinine Equation (2021)    Anion gap 08/10/2023 9  5 -  15 Final   Performed at [facility], [address] [place], [place] [address]  Hospital Outpatient Visit on 08/03/2023  Component Date Value Ref Range Status   Sodium 08/03/2023 139  135 - 145 mmol/L Final   Potassium 08/03/2023 3.8  3.5 - 5.1 mmol/L Final   Chloride 08/03/2023 107  98 - 111 mmol/L Final   CO2 08/03/2023 25  22 - 32 mmol/L Final   Glucose, Bld 08/03/2023 102 (H)  70 - 99 mg/dL Final   Glucose reference range applies only to samples taken after fasting for at least 8 hours.   BUN 08/03/2023 20  8 - 23 mg/dL Final   Creatinine, Ser 08/03/2023 0.91  0.61 - 1.24 mg/dL Final   Calcium 29/56/2130 8.8 (L)  8.9 - 10.3 mg/dL Final   GFR, Estimated 08/03/2023 >60  >60 mL/min Final   Comment: (NOTE) Calculated using the CKD-EPI Creatinine Equation (2021)    Anion gap 08/03/2023 7  5 - 15 Final   Performed at [facility], [address], [place], [place] [address]   WBC 08/03/2023 7.4  4.0 - 10.5 K/uL Final   RBC 08/03/2023 4.53  4.22 - 5.81 MIL/uL Final   Hemoglobin 08/03/2023 11.5 (L)  13.0 - 17.0 g/dL Final   HCT 46/96/2952 38.4 (L)  39.0 - 52.0 % Final   MCV 08/03/2023 84.8  80.0 - 100.0 fL Final   MCH 08/03/2023 25.4 (L)  26.0 - 34.0 pg Final   MCHC 08/03/2023 29.9 (L)  30.0 - 36.0 g/dL Final   RDW 84/13/2440 17.2 (H)  11.5 - 15.5 % Final   Platelets 08/03/2023 180  150 - 400 K/uL Final   nRBC 08/03/2023 0.0  0.0 - 0.2 % Final   Performed at [facility], [address] [place], [place] [address]   ABO/RH(D) 08/03/2023 A NEG   Final   Antibody Screen 08/03/2023 NEG   Final   Sample Expiration 08/03/2023 08/12/2023,2359   Final   Extend sample reason 08/03/2023    Final                   Value:NO TRANSFUSIONS OR PREGNANCY IN THE PAST 3 MONTHS Performed at [facility] [address] [place], [place] [address]    MRSA, PCR 08/03/2023 NEGATIVE  NEGATIVE Final   Staphylococcus aureus 08/03/2023 NEGATIVE  NEGATIVE Final   Comment: (NOTE) The Xpert SA Assay (FDA approved for NASAL specimens in patients 75 years of age and older), is one component of a comprehensive surveillance program. It is not intended to diagnose infection nor to guide or monitor treatment. Performed at [facility], [address] [place], [place] [address]   Office Visit on 06/23/2023  Component Date Value Ref Range Status   Glucose 07/01/2023 103 (H)  70 - 99 mg/dL Final   BUN 42/59/5638 18  8 - 27 mg/dL Final   Creatinine, Ser 07/01/2023 1.00  0.76 - 1.27 mg/dL Final   eGFR 75/64/3329 79  >59 mL/min/1.73 Final   BUN/Creatinine Ratio 07/01/2023 18  10 - 24 Final   Sodium 07/01/2023 144  134 - 144 mmol/L Final   Potassium 07/01/2023 3.9  3.5 - 5.2 mmol/L Final   Chloride 07/01/2023 106  96 - 106 mmol/L Final   CO2 07/01/2023 22  20 - 29 mmol/L  Final   Calcium 07/01/2023 9.1  8.6 - 10.2 mg/dL Final   Magnesium 51/88/4166 2.3  1.6 -  2.3 mg/dL Final     X-Rays:DG Pelvis Portable  Result Date: 08/09/2023 CLINICAL DATA:  Status post right hip arthroplasty. EXAM: PORTABLE PELVIS 1-2 VIEWS COMPARISON:  CT abdomen and pelvis 06/25/2023 FINDINGS: Interval total right hip arthroplasty. No perihardware lucency is seen to indicate hardware failure or loosening. Expected lateral right hip subcutaneous air from recent surgery. Severe left superior femoroacetabular joint space narrowing, unchanged. The pubic symphysis and bilateral sacroiliac joint spaces are maintained. No acute fracture or dislocation. Bilateral groin surgical clips are again noted. IMPRESSION: Interval total right hip arthroplasty without evidence of hardware failure or loosening. Electronically Signed   By: Neita Garnet M.D.   On: 08/09/2023 15:43   DG HIP UNILAT WITH PELVIS 1V RIGHT  Result Date: 08/09/2023 CLINICAL DATA:  Right hip arthroplasty.  Intraoperative fluoroscopy. EXAM: DG HIP (WITH OR WITHOUT PELVIS) 1V RIGHT COMPARISON:  KUB 03/21/2020 FINDINGS: Images were performed intraoperatively without the presence of a radiologist. Interval right hip arthroplasty. No hardware complication is seen. Severe superior left femoroacetabular joint space narrowing. Total fluoroscopy images: 2 Total fluoroscopy time: 12 seconds Total dose: Radiation Exposure Index (as provided by the fluoroscopic device): 1.35 mGy air Kerma Please see intraoperative findings for further detail. IMPRESSION: Intraoperative fluoroscopy for right hip arthroplasty. Electronically Signed   By: Neita Garnet M.D.   On: 08/09/2023 15:40   DG C-Arm 1-60 Min-No Report  Result Date: 08/09/2023 Fluoroscopy was utilized by the requesting physician.  No radiographic interpretation.    EKG: Orders placed or performed in visit on 08/03/23   EKG 12-Lead     Hospital Course: Zayde Laessig is a 77 y.o. who was  admitted to [facility]. They were brought to the operating room on 08/09/2023 and underwent Procedure(s): TOTAL HIP ARTHROPLASTY ANTERIOR APPROACH.  Patient tolerated the procedure well and was later transferred to the recovery room and then to the orthopaedic floor for postoperative care. They were given PO and IV analgesics for pain control following their surgery. They were given 24 hours of postoperative antibiotics of  Anti-infectives (From admission, onward)    Start     Dose/Rate Route Frequency Ordered Stop   08/09/23 1830  ceFAZolin (ANCEF) IVPB 2g/100 mL premix        2 g 200 mL/hr over 30 Minutes Intravenous Every 6 hours 08/09/23 1609 08/10/23 0200   08/09/23 1100  ceFAZolin (ANCEF) IVPB 2g/100 mL premix        2 g 200 mL/hr over 30 Minutes Intravenous On call to O.R. 08/09/23 1056 08/09/23 1317      and started on DVT prophylaxis in the form of Aspirin and Xarelto.   PT and OT were ordered for total joint protocol. Discharge planning consulted to help with postop disposition and equipment needs.  Patient had a uneventful night on the evening of surgery. They started to get up OOB with therapy on POD 0. Pt was seen during rounds and was ready to go home pending progress with therapy. He worked with therapy on POD #1 and was meeting his goals. Pt was discharged to home later that day in stable condition.  Diet: Regular diet Activity: WBAT Follow-up: in 2 weeks Disposition: Home Discharged Condition: good   Discharge Instructions     Call MD / Call 911   Complete by: As directed    If you experience chest pain or shortness of breath, CALL 911 and be transported to the hospital emergency room.  If you develope a fever above 101  F, pus (white drainage) or increased drainage or redness at the wound, or calf pain, call your surgeon's office.   Change dressing   Complete by: As directed    You have an adhesive waterproof bandage over the incision. Leave this in place until  your first follow-up appointment. Once you remove this you will not need to place another bandage.   Constipation Prevention   Complete by: As directed    Drink plenty of fluids.  Prune juice may be helpful.  You may use a stool softener, such as Colace (over the counter) 100 mg twice a day.  Use MiraLax (over the counter) for constipation as needed.   Diet - low sodium heart healthy   Complete by: As directed    Do not sit on low chairs, stoools or toilet seats, as it may be difficult to get up from low surfaces   Complete by: As directed    Driving restrictions   Complete by: As directed    No driving for two weeks   Post-operative opioid taper instructions:   Complete by: As directed    POST-OPERATIVE OPIOID TAPER INSTRUCTIONS: It is important to wean off of your opioid medication as soon as possible. If you do not need pain medication after your surgery it is ok to stop day one. Opioids include: Codeine, Hydrocodone(Norco, Vicodin), Oxycodone(Percocet, oxycontin) and hydromorphone amongst others.  Long term and even short term use of opiods can cause: Increased pain response Dependence Constipation Depression Respiratory depression And more.  Withdrawal symptoms can include Flu like symptoms Nausea, vomiting And more Techniques to manage these symptoms Hydrate well Eat regular healthy meals Stay active Use relaxation techniques(deep breathing, meditating, yoga) Do Not substitute Alcohol to help with tapering If you have been on opioids for less than two weeks and do not have pain than it is ok to stop all together.  Plan to wean off of opioids This plan should start within one week post op of your joint replacement. Maintain the same interval or time between taking each dose and first decrease the dose.  Cut the total daily intake of opioids by one tablet each day Next start to increase the time between doses. The last dose that should be eliminated is the evening dose.       TED hose   Complete by: As directed    Use stockings (TED hose) for three weeks on both leg(s).  You may remove them at night for sleeping.   Weight bearing as tolerated   Complete by: As directed       Allergies as of 08/10/2023   No Known Allergies      Medication List     STOP taking these medications    aspirin EC 81 MG tablet   multivitamin with minerals Tabs tablet       TAKE these medications    acetaminophen 650 MG CR tablet Commonly known as: TYLENOL Take 1,300 mg by mouth every 8 (eight) hours as needed for pain (Mild pain).   albuterol 108 (90 Base) MCG/ACT inhaler Commonly known as: VENTOLIN HFA Inhale 2 puffs into the lungs daily as needed for wheezing or shortness of breath (Depend on activity level).   atorvastatin 40 MG tablet Commonly known as: LIPITOR Take 1 tablet (40 mg total) by mouth at bedtime.   bisacodyl 5 MG EC tablet Commonly known as: DULCOLAX Take 5 mg by mouth daily as needed for moderate constipation.   Breztri Aerosphere 160-9-4.8 MCG/ACT [employer]  Generic drug: Budeson-Glycopyrrol-Formoterol Inhale 2 puffs into the lungs in the morning and at bedtime.   docusate sodium 100 MG capsule Commonly known as: COLACE Take 100 mg by mouth every other day.   ezetimibe 10 MG tablet Commonly known as: ZETIA Take 10 mg by mouth at bedtime.   fluorouracil 5 % cream Commonly known as: EFUDEX Apply 1 Application topically daily as needed (Itch).   GNP Iron 200 (65 Fe) MG Tabs Generic drug: Ferrous Sulfate Dried Take 200 mg by mouth at bedtime.   HYDROcodone-acetaminophen 5-325 MG tablet Commonly known as: NORCO/VICODIN Take 1-2 tablets by mouth every 6 (six) hours as needed for moderate pain or severe pain (pain score 4-6).   methocarbamol 500 MG tablet Commonly known as: ROBAXIN Take 1 tablet (500 mg total) by mouth every 6 (six) hours as needed for muscle spasms.   metoprolol tartrate 25 MG tablet Commonly known as:  LOPRESSOR Take 1 tablet (25 mg total) by mouth 2 (two) times daily.   nitroGLYCERIN 0.4 MG SL tablet Commonly known as: NITROSTAT Place 0.4 mg under the tongue every 5 (five) minutes as needed for chest pain.   omeprazole 10 MG capsule Commonly known as: PRILOSEC Take 10 mg by mouth at bedtime.   polyethylene glycol 17 g packet Commonly known as: MIRALAX / GLYCOLAX Take 17 g by mouth daily as needed for mild constipation.   potassium chloride 10 MEQ tablet Commonly known as: KLOR-CON Take 1 tablet (10 mEq total) by mouth daily.   rivaroxaban 10 MG Tabs tablet Commonly known as: XARELTO Take 1 tablet (10 mg total) by mouth daily with breakfast for 20 days.   senna 8.6 MG tablet Commonly known as: SENOKOT Take 1 tablet by mouth daily as needed for constipation.   sildenafil 100 MG tablet Commonly known as: VIAGRA Take 100 mg by mouth daily as needed for erectile dysfunction.   torsemide 20 MG tablet Commonly known as: DEMADEX Take 2 tablets (40 mg total) by mouth daily. What changed: how much to take   traMADol 50 MG tablet Commonly known as: ULTRAM Take 1-2 tablets (50-100 mg total) by mouth every 6 (six) hours as needed for moderate pain. What changed: how much to take               Discharge Care Instructions  (From admission, onward)           Start     Ordered   08/10/23 0000  Weight bearing as tolerated        08/10/23 1236   08/10/23 0000  Change dressing       Comments: You have an adhesive waterproof bandage over the incision. Leave this in place until your first follow-up appointment. Once you remove this you will not need to place another bandage.   08/10/23 1236            Follow-up Information     Ollen Gross, MD. Go on 08/24/2023.   Specialty: Orthopedic Surgery Why: You are scheduled for first post op appt on Tuesday September 3 at 1:00pm Contact information: [address] [place] [address] [place] [address] [phone]                  Signed: Weston Brass, PA-C Orthopedic Surgery 08/12/2023, 11:56 AM

## 2023-08-25 ENCOUNTER — Ambulatory Visit: Payer: Medicare Other | Admitting: Physician Assistant

## 2023-09-02 ENCOUNTER — Ambulatory Visit (HOSPITAL_COMMUNITY)
Admission: RE | Admit: 2023-09-02 | Discharge: 2023-09-02 | Disposition: A | Discharge status: Home / Self Care | Payer: Medicare Other | Source: Ambulatory Visit | Attending: Internal Medicine | Admitting: Internal Medicine

## 2023-09-02 DIAGNOSIS — J90 Pleural effusion, not elsewhere classified: Secondary | ICD-10-CM | POA: Diagnosis not present

## 2023-09-02 DIAGNOSIS — C3490 Malignant neoplasm of unspecified part of unspecified bronchus or lung: Secondary | ICD-10-CM | POA: Diagnosis not present

## 2023-09-02 DIAGNOSIS — J929 Pleural plaque without asbestos: Secondary | ICD-10-CM | POA: Diagnosis not present

## 2023-09-02 MED ORDER — IOHEXOL 300 MG/ML  SOLN
75.0000 mL | Freq: Once | INTRAMUSCULAR | Status: AC | PRN
  Administered 2023-09-02: 75 mL via INTRAVENOUS

## 2023-09-06 ENCOUNTER — Inpatient Hospital Stay: Payer: Medicare Other

## 2023-09-06 ENCOUNTER — Inpatient Hospital Stay: Payer: Medicare Other | Attending: Internal Medicine | Admitting: Internal Medicine

## 2023-09-06 VITALS — BP 155/78 | HR 61 | Temp 97.7°F | Resp 16 | Ht 68.0 in | Wt 172.1 lb

## 2023-09-06 DIAGNOSIS — Z85118 Personal history of other malignant neoplasm of bronchus and lung: Secondary | ICD-10-CM | POA: Insufficient documentation

## 2023-09-06 DIAGNOSIS — C3490 Malignant neoplasm of unspecified part of unspecified bronchus or lung: Secondary | ICD-10-CM

## 2023-09-06 DIAGNOSIS — Z902 Acquired absence of lung [part of]: Secondary | ICD-10-CM | POA: Diagnosis not present

## 2023-09-06 DIAGNOSIS — Z9221 Personal history of antineoplastic chemotherapy: Secondary | ICD-10-CM | POA: Insufficient documentation

## 2023-09-06 LAB — CBC WITH DIFFERENTIAL (CANCER CENTER ONLY)
Abs Immature Granulocytes: 0.02 10*3/uL (ref 0.00–0.07)
Basophils Absolute: 0.1 10*3/uL (ref 0.0–0.1)
Basophils Relative: 1 %
Eosinophils Absolute: 0.3 10*3/uL (ref 0.0–0.5)
Eosinophils Relative: 4 %
HCT: 36 % — ABNORMAL LOW (ref 39.0–52.0)
Hemoglobin: 10.8 g/dL — ABNORMAL LOW (ref 13.0–17.0)
Immature Granulocytes: 0 %
Lymphocytes Relative: 17 %
Lymphs Abs: 1.1 10*3/uL (ref 0.7–4.0)
MCH: 25.4 pg — ABNORMAL LOW (ref 26.0–34.0)
MCHC: 30 g/dL (ref 30.0–36.0)
MCV: 84.5 fL (ref 80.0–100.0)
Monocytes Absolute: 0.6 10*3/uL (ref 0.1–1.0)
Monocytes Relative: 9 %
Neutro Abs: 4.5 10*3/uL (ref 1.7–7.7)
Neutrophils Relative %: 69 %
Platelet Count: 176 10*3/uL (ref 150–400)
RBC: 4.26 MIL/uL (ref 4.22–5.81)
RDW: 18.2 % — ABNORMAL HIGH (ref 11.5–15.5)
WBC Count: 6.5 10*3/uL (ref 4.0–10.5)
nRBC: 0 % (ref 0.0–0.2)

## 2023-09-06 LAB — CMP (CANCER CENTER ONLY)
ALT: 21 U/L (ref 0–44)
AST: 19 U/L (ref 15–41)
Albumin: 4 g/dL (ref 3.5–5.0)
Alkaline Phosphatase: 85 U/L (ref 38–126)
Anion gap: 6 (ref 5–15)
BUN: 24 mg/dL — ABNORMAL HIGH (ref 8–23)
CO2: 31 mmol/L (ref 22–32)
Calcium: 9 mg/dL (ref 8.9–10.3)
Chloride: 104 mmol/L (ref 98–111)
Creatinine: 1.13 mg/dL (ref 0.61–1.24)
GFR, Estimated: >60 mL/min (ref >60)
Glucose, Bld: 102 mg/dL — ABNORMAL HIGH (ref 70–99)
Potassium: 4.1 mmol/L (ref 3.5–5.1)
Sodium: 141 mmol/L (ref 135–145)
Total Bilirubin: 0.5 mg/dL (ref 0.3–1.2)
Total Protein: 6.3 g/dL — ABNORMAL LOW (ref 6.5–8.1)

## 2023-09-06 NOTE — Progress Notes (Unsigned)
Cardiology Office Note:   Date:  09/08/2023  ID:  Daniel Colon, DOB 08-Dec-1946, MRN [id number] PCP: Garlan Fillers, MD  [facility] Providers Cardiologist:  Rollene Rotunda, MD {  History of Present Illness:   Daniel Colon is a 77 y.o. male with a hx of CAD, s/p stent to RCA in 1996 in [place] and non-small cell lung cancer status post status post robotic assisted right upper lobectomy by Dr. Tyrone Sage  in 2021 & chemotherapy, 42-pack-year cigarette smoker having quit in 2012, hypertension, hyperlipidemia.  In March 2024 for preop clearance prior to possible hip surgery.  Patient noted some decreased exercise tolerance, muscle weakness and some dyspnea/chest discomfort on exertion.  He underwent cardiac PET which showed high risk findings including decrease in EF with stress, TID, large perfusion defect.  He then underwent left heart catheterization on Apr 22, 2023 showing three-vessel disease was admitted for CABG workup.  He underwent CABG x 4 utilizing LIMA to LAD, SVG to OM, SVG to Diagonal, SVG to PDA.  With postop blood loss, he was transfused 2 units and later another 2 units.  He remained in normal sinus rhythm.   Carvedilol and Micardis-HCTZ were discontinued.  Discharged on aspirin 325 mg daily.  Discharged on Micardis 40mg  daily and metoprolol tartrate 25 mg twice daily.  He had weight gain and edema post op and he was given Lasix and Zaroxolyn.  BP was low so Micardis was stopped.  He was in the emergency serum in June and had some shortness of breath.  CT showed no pulmonary embolism.  Since I last saw him he has had no new cardiovascular complaints.  He is still mildly dyspneic but he thinks this is somewhat chronically improved.  He did have his hip surgery.  They are okay with that and the PT.  ROS: As stated in the HPI and positive chronic constipation.  Negative for all other systems.  Studies Reviewed:    EKG:   NA  Risk Assessment/Calculations:        Physical  Exam:   VS:  BP 128/68 (BP Location: Left Arm, Patient Position: Sitting, Cuff Size: Normal)   Pulse 63   Ht 5\' 8"  (1.727 m)   Wt 172 lb 9.6 oz (78.3 kg)   SpO2 95%   BMI 26.24 kg/m    Wt Readings from Last 3 Encounters:  09/08/23 172 lb 9.6 oz (78.3 kg)  09/06/23 172 lb 1.6 oz (78.1 kg)  08/09/23 179 lb 0.2 oz (81.2 kg)     GEN: Well nourished, well developed in no acute distress NECK: No JVD; No carotid bruits CARDIAC: RRR, no murmurs, rubs, gallops RESPIRATORY:  Clear to auscultation without rales, wheezing or rhonchi  ABDOMEN: Soft, non-tender, non-distended EXTREMITIES:  No edema; No deformity   ASSESSMENT AND PLAN:   Diastolic heart failure: He seems to be euvolemic.  No change in therapy.   Coronary artery disease status post CABG: The patient has no new sypmtoms.  No further cardiovascular testing is indicated.  We will continue with aggressive risk reduction and meds as listed.  Hypertension,   the blood pressure is well-controlled.  No change in therapy.   Hyperlipidemia: LDL was 53 with an HDL of 47.  No change in therapy.        Follow up with me in one year.   Signed, Rollene Rotunda, MD

## 2023-09-06 NOTE — Progress Notes (Signed)
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 Tyrone Sage. 2) Adjuvant systemic chemotherapy with carboplatin for AUC of 6 and paclitaxel 200 mg/M2 every 3 weeks with Neulasta support.  First dose February 28, 2020.  Status post 4 cycles.  Last dose was given May 01, 2020.  CURRENT THERAPY: Observation.  INTERVAL HISTORY: Daniel Colon 77 y.o. male returns to the clinic today for annual follow-up visit accompanied by his wife.  The patient is feeling fine today with no concerning complaints except for mild shortness of breath with exertion and swelling of the lower extremity secondary to poor circulation and congestive heart failure.  He had right total hip replacement in August 2024.  He denied having any current chest pain, cough or hemoptysis.  He has no nausea, vomiting, diarrhea or constipation.  He has no headache or visual changes.  He denied having any fever or chills.  He is here today for evaluation with repeat CT scan of the chest for restaging of his disease.   MEDICAL HISTORY: Past Medical History:  Diagnosis Date   Allergy    Arthritis    Barrett's esophagus    CAD (coronary artery disease)    a. Stent to the RCA 1996 (in [place]);  b. ETT(11/13/13): abnormal with early ST changes to suggest ischemia;   c. LHC (11/17/13):  pLAD 30-40, D1 30, mLAD 70-75, mCFX 70, mRCA stent 40 (ISR), EF 55-65%.  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  MEDICATIONS:  Current Outpatient Medications  Medication Sig Dispense Refill   acetaminophen (TYLENOL) 650 MG CR tablet Take 1,300 mg by mouth every 8 (eight) hours as needed for pain (Mild pain).     albuterol (VENTOLIN HFA) 108 (90 Base) MCG/ACT inhaler Inhale 2 puffs into the lungs daily as needed for wheezing or shortness of breath (Depend on activity level).     atorvastatin (LIPITOR) 40 MG tablet Take 1 tablet (40 mg total) by mouth at bedtime. 90 tablet 3   bisacodyl (DULCOLAX) 5 MG EC tablet Take 5 mg by mouth daily as needed for moderate constipation.     BREZTRI AEROSPHERE 160-9-4.8 MCG/ACT AERO Inhale 2 puffs into the lungs in the morning and at bedtime.     docusate sodium (COLACE) 100 MG capsule Take 100 mg by mouth every other day.     ezetimibe (ZETIA) 10 MG tablet Take 10 mg by mouth at bedtime.     fluorouracil (EFUDEX) 5 % cream Apply 1 Application topically daily as needed (Itch).     GNP IRON 200 (65 Fe) MG TABS Take 200  mg by mouth at bedtime.     HYDROcodone-acetaminophen (NORCO/VICODIN) 5-325 MG tablet Take 1-2 tablets by mouth every 6 (six) hours as needed for moderate pain or severe pain (pain score 4-6). 30 tablet 0   methocarbamol (ROBAXIN) 500 MG tablet Take 1 tablet (500 mg total) by mouth every 6 (six) hours as needed for muscle spasms. 40 tablet 0   metoprolol tartrate (LOPRESSOR) 25 MG tablet Take 1 tablet (25 mg total) by mouth 2 (two) times daily. 180 tablet 3   nitroGLYCERIN (NITROSTAT) 0.4 MG SL tablet Place 0.4 mg under the tongue every 5 (five) minutes as needed for chest pain.     omeprazole (PRILOSEC) 10 MG capsule Take 10 mg by mouth at bedtime.     polyethylene glycol (MIRALAX / GLYCOLAX) 17 g packet Take 17 g by mouth  daily as needed for mild constipation.     potassium chloride (KLOR-CON) 10 MEQ tablet Take 1 tablet (10 mEq total) by mouth daily. 90 tablet 3   rivaroxaban (XARELTO) 10 MG TABS tablet Take 1 tablet (10 mg total) by mouth daily with breakfast for 20 days. 20 tablet 0   senna (SENOKOT) 8.6 MG tablet Take 1 tablet by mouth daily as needed for constipation.     sildenafil (VIAGRA) 100 MG tablet Take 100 mg by mouth daily as needed for erectile dysfunction.     torsemide (DEMADEX) 20 MG tablet Take 2 tablets (40 mg total) by mouth daily. (Patient taking differently: Take 20 mg by mouth daily.) 60 tablet 3   traMADol (ULTRAM) 50 MG tablet Take 1-2 tablets (50-100 mg total) by mouth every 6 (six) hours as needed for moderate pain. 30 tablet 0   No current facility-administered medications for this visit.    SURGICAL HISTORY:  Past Surgical History:  Procedure Laterality Date   ANGIOPLASTY     stent - rt cor. art   BILROTH II PROCEDURE     CARDIAC CATHETERIZATION     CIRCUMCISION     COLONOSCOPY     CORONARY ARTERY BYPASS GRAFT N/A 04/27/2023   Procedure: CORONARY ARTERY BYPASS GRAFTING (CABG) X 4, USING LEFT INTERNAL MAMMARY ARTERY AND RIGHT LEG GREATER SAPHENOUS VEIN HARVESTED ENDOSCOPICALLY;  Surgeon: Corliss Skains, MD;  Location: [facility] OR;  Service: Open Heart Surgery;  Laterality: N/A;   CORONARY PRESSURE/FFR STUDY N/A 04/22/2023   Procedure: CORONARY PRESSURE/FFR STUDY;  Surgeon: Kathleene Hazel, MD;  Location: [facility] INVASIVE CV LAB;  Service: Cardiovascular;  Laterality: N/A;   INTERCOSTAL NERVE BLOCK Right 01/24/2020   Procedure: Intercostal Nerve Block;  Surgeon: Delight Ovens, MD;  Location: [facility] OR;  Service: Thoracic;  Laterality: Right;   KNEE ARTHROSCOPY Left    LEFT HEART CATH AND CORONARY ANGIOGRAPHY N/A 04/22/2023   Procedure: LEFT HEART CATH AND CORONARY ANGIOGRAPHY;  Surgeon: Kathleene Hazel, MD;  Location: [facility] INVASIVE CV LAB;  Service: Cardiovascular;   Laterality: N/A;   LEFT HEART CATHETERIZATION WITH CORONARY ANGIOGRAM N/A 11/17/2013   Procedure: LEFT HEART CATHETERIZATION WITH CORONARY ANGIOGRAM;  Surgeon: Micheline Chapman, MD;  Location: [facility] CATH LAB;  Service: Cardiovascular;  Laterality: N/A;   LUNG LOBECTOMY  01/2020   had chemo, no radtion   LYMPH NODE DISSECTION N/A 01/24/2020   Procedure: LYMPH NODE DISSECTION;  Surgeon: Delight Ovens, MD;  Location: [facility] OR;  Service: Thoracic;  Laterality: N/A;   NECK LESION BIOPSY     POLYPECTOMY     REFRACTIVE SURGERY     SKIN BIOPSY  Back   TEE WITHOUT CARDIOVERSION N/A 04/27/2023   Procedure: TRANSESOPHAGEAL ECHOCARDIOGRAM;  Surgeon: Corliss Skains, MD;  Location: [facility] OR;  Service: Open Heart Surgery;  Laterality: N/A;   Thigh surgery Left    Infant   TOTAL HIP ARTHROPLASTY Right 08/09/2023   Procedure: TOTAL HIP ARTHROPLASTY ANTERIOR APPROACH;  Surgeon: Ollen Gross, MD;  Location: [facility] ORS;  Service: Orthopedics;  Laterality: Right;   ulcer surgery     ULNAR NERVE TRANSPOSITION Right 08/05/2017   Procedure: RIGHT ULNAR NERVE DECOMPRESSION;  Surgeon: Betha Loa, MD;  Location: [facility];  Service: Orthopedics;  Laterality: Right;   UPPER GI ENDOSCOPY     VIDEO BRONCHOSCOPY N/A 01/24/2020   Procedure: VIDEO BRONCHOSCOPY;  Surgeon: Delight Ovens, MD;  Location: [facility] OR;  Service: Thoracic;  Laterality: N/A;   WISDOM TOOTH EXTRACTION      REVIEW OF SYSTEMS:  A comprehensive review of systems was negative except for: Respiratory: positive for dyspnea on exertion   PHYSICAL EXAMINATION: General appearance: alert, cooperative, and no distress Head: Normocephalic, without obvious abnormality, atraumatic Neck: no adenopathy, no JVD, supple, symmetrical, trachea midline, and thyroid not enlarged, symmetric, no tenderness/mass/nodules Lymph nodes: Cervical, supraclavicular, and axillary nodes normal. Resp: clear to auscultation bilaterally Back: symmetric, no  curvature. ROM normal. No CVA tenderness. Cardio: regular rate and rhythm, S1, S2 normal, no murmur, click, rub or gallop GI: soft, non-tender; bowel sounds normal; no masses,  no organomegaly Extremities: extremities normal, atraumatic, no cyanosis or edema  ECOG PERFORMANCE STATUS: 1 - Symptomatic but completely ambulatory  Blood pressure (!) 155/78, pulse 61, temperature 97.7 F (36.5 C), resp. rate 16, height 5\' 8"  (1.727 m), weight 172 lb 1.6 oz (78.1 kg), SpO2 100%.  LABORATORY DATA: Lab Results  Component Value Date   WBC 6.5 09/06/2023   HGB 10.8 (L) 09/06/2023   HCT 36.0 (L) 09/06/2023   MCV 84.5 09/06/2023   PLT 176 09/06/2023      Chemistry      Component Value Date/Time   NA 132 (L) 08/10/2023 0335   NA 144 07/01/2023 0830   K 3.3 (L) 08/10/2023 0335   CL 99 08/10/2023 0335   CO2 24 08/10/2023 0335   BUN 19 08/10/2023 0335   BUN 18 07/01/2023 0830   CREATININE 1.07 08/10/2023 0335   CREATININE 0.98 05/18/2023 1240      Component Value Date/Time   CALCIUM 8.0 (L) 08/10/2023 0335   ALKPHOS 126 05/25/2023 1836   AST 30 05/25/2023 1836   AST 22 09/04/2022 0745   ALT 61 (H) 05/25/2023 1836   ALT 36 09/04/2022 0745   BILITOT 1.0 05/25/2023 1836   BILITOT 0.8 09/04/2022 0745       RADIOGRAPHIC STUDIES: DG Pelvis Portable  Result Date: 08/09/2023 CLINICAL DATA:  Status post right hip arthroplasty. EXAM: PORTABLE PELVIS 1-2 VIEWS COMPARISON:  CT abdomen and pelvis 06/25/2023 FINDINGS: Interval total right hip arthroplasty. No perihardware lucency is seen to indicate hardware failure or loosening. Expected lateral right hip subcutaneous air from recent surgery. Severe left superior femoroacetabular joint space narrowing, unchanged. The pubic symphysis and bilateral sacroiliac joint spaces are maintained. No acute fracture or dislocation. Bilateral groin surgical clips are again noted. IMPRESSION: Interval total right hip arthroplasty without evidence of hardware  failure or loosening. Electronically Signed   By: Neita Garnet M.D.   On: 08/09/2023 15:43   DG HIP UNILAT WITH PELVIS 1V RIGHT  Result Date: 08/09/2023 CLINICAL DATA:  Right hip arthroplasty.  Intraoperative fluoroscopy. EXAM: DG HIP (WITH OR WITHOUT PELVIS) 1V RIGHT COMPARISON:  KUB 03/21/2020 FINDINGS: Images were performed intraoperatively without the presence of a radiologist. Interval right hip arthroplasty. No hardware complication is seen. Severe superior left femoroacetabular joint space narrowing. Total fluoroscopy images: 2 Total fluoroscopy time: 12 seconds Total dose: Radiation Exposure Index (as provided by the fluoroscopic device): 1.35 mGy air Kerma Please see intraoperative findings for further detail. IMPRESSION: Intraoperative fluoroscopy for right hip arthroplasty. Electronically Signed   By: Neita Garnet M.D.   On: 08/09/2023 15:40   DG C-Arm 1-60 Min-No Report  Result Date: 08/09/2023 Fluoroscopy was utilized by the requesting physician.  No radiographic interpretation.     ASSESSMENT AND PLAN: This is a very pleasant 77 years old white male recently diagnosed with a stage IIb non-small cell lung cancer with sarcomatoid features in February 2021 status post right upper lobectomy with lymph node dissection under the care of Dr. Tyrone Sage. The patient is also status post 4 cycles of adjuvant systemic chemotherapy with carboplatin and paclitaxel completed in May 2021. The patient has been on observation since that time and he is feeling fine with no concerning complaints except for mild shortness of breath with exertion. He had repeat CT scan of the chest performed recently.  I personally and independently reviewed the scan images and discussed the result with the patient and his wife.  The final report of this scan is still pending.  I do not see any concerning findings on the scan but I will wait for the final report for confirmation. I recommended for the patient to continue on  observation with repeat CT scan of the chest in 1 year. He was advised to call immediately if he has any other concerning symptoms in the interval. The patient voices understanding of current disease status and treatment options and is in agreement with the current care plan.  All questions were answered. The patient knows to call the clinic with any problems, questions or concerns. We can certainly see the patient much sooner if necessary.  Disclaimer: This note was dictated with voice recognition software. Similar sounding words can inadvertently be transcribed and may not be corrected upon review.

## 2023-09-08 ENCOUNTER — Encounter: Payer: Self-pay | Admitting: Cardiology

## 2023-09-08 ENCOUNTER — Ambulatory Visit: Payer: Medicare Other | Attending: Cardiology | Admitting: Cardiology

## 2023-09-08 VITALS — BP 128/68 | HR 63 | Ht 68.0 in | Wt 172.6 lb

## 2023-09-08 DIAGNOSIS — K5909 Other constipation: Secondary | ICD-10-CM | POA: Insufficient documentation

## 2023-09-08 DIAGNOSIS — E785 Hyperlipidemia, unspecified: Secondary | ICD-10-CM | POA: Insufficient documentation

## 2023-09-08 DIAGNOSIS — I5030 Unspecified diastolic (congestive) heart failure: Secondary | ICD-10-CM | POA: Insufficient documentation

## 2023-09-08 DIAGNOSIS — I2510 Atherosclerotic heart disease of native coronary artery without angina pectoris: Secondary | ICD-10-CM | POA: Insufficient documentation

## 2023-09-08 DIAGNOSIS — I10 Essential (primary) hypertension: Secondary | ICD-10-CM | POA: Diagnosis present

## 2023-09-08 DIAGNOSIS — Z23 Encounter for immunization: Secondary | ICD-10-CM | POA: Diagnosis not present

## 2023-09-08 NOTE — Patient Instructions (Signed)
Medication Instructions:  NO CHANGES  *If you need a refill on your cardiac medications before your next appointment, please call your pharmacy*  Follow-Up: At [facility], you and your health needs are our priority.  As part of our continuing mission to provide you with exceptional heart care, we have created designated Provider Care Teams.  These Care Teams include your primary Cardiologist (physician) and Advanced Practice Providers (APPs -  Physician Assistants and Nurse Practitioners) who all work together to provide you with the care you need, when you need it.  We recommend signing up for the patient portal called "MyChart".  Sign up information is provided on this After Visit Summary.  MyChart is used to connect with patients for Virtual Visits (Telemedicine).  Patients are able to view lab/test results, encounter notes, upcoming appointments, etc.  Non-urgent messages can be sent to your provider as well.   To learn more about what you can do with MyChart, go to [email or web address].    Your next appointment:    12 months with Dr. Hochrein 

## 2023-09-14 DIAGNOSIS — Z5189 Encounter for other specified aftercare: Secondary | ICD-10-CM | POA: Diagnosis not present

## 2023-09-21 DIAGNOSIS — I10 Essential (primary) hypertension: Secondary | ICD-10-CM | POA: Diagnosis not present

## 2023-09-21 DIAGNOSIS — K4090 Unilateral inguinal hernia, without obstruction or gangrene, not specified as recurrent: Secondary | ICD-10-CM | POA: Diagnosis not present

## 2023-09-21 DIAGNOSIS — K5900 Constipation, unspecified: Secondary | ICD-10-CM | POA: Diagnosis not present

## 2023-09-21 DIAGNOSIS — R1031 Right lower quadrant pain: Secondary | ICD-10-CM | POA: Diagnosis not present

## 2023-10-05 ENCOUNTER — Ambulatory Visit (INDEPENDENT_AMBULATORY_CARE_PROVIDER_SITE_OTHER): Payer: Medicare Other | Admitting: Pulmonary Disease

## 2023-10-05 ENCOUNTER — Encounter: Payer: Self-pay | Admitting: Pulmonary Disease

## 2023-10-05 VITALS — BP 130/64 | HR 69 | Temp 99.0°F | Ht 68.0 in | Wt 173.4 lb

## 2023-10-05 DIAGNOSIS — C3401 Malignant neoplasm of right main bronchus: Secondary | ICD-10-CM | POA: Diagnosis not present

## 2023-10-05 DIAGNOSIS — J4489 Other specified chronic obstructive pulmonary disease: Secondary | ICD-10-CM | POA: Diagnosis not present

## 2023-10-05 DIAGNOSIS — J439 Emphysema, unspecified: Secondary | ICD-10-CM | POA: Diagnosis not present

## 2023-10-05 NOTE — Progress Notes (Signed)
Daniel Colon    [id number]    [birth year]  Primary Care Physician:Paterson, Barry Dienes, MD  Referring Physician: Garlan Fillers, MD [address] [place],  [place] [address]  Chief complaint: Follow-up for COPD  HPI: 77 y.o.  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 Status post right upper lobectomy with lymph node dissection, status post 4 cycles of adjuvant systemic chemotherapy with carboplatin and paclitaxel completed in May 2021.  Complains of dyspnea on exertion for the past few months.  No symptoms at rest.  Has occasional cough with mucus production.  No fevers, chills  Pets: 2 cats.  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  No ongoing exposures with mold, hot tub, Jacuzzi.  No down pillows or comforters Smoking history: 40-pack-year smoker.  Quit in 2012 Travel history: Originally from [place].  No significant recent travel Relevant family history: No significant family history of lung disease  Interim history: Discussed the use of AI scribe software for clinical note transcription with the patient, who gave verbal consent to proceed.  The patient, with a history of COPD, lung cancer, and recent heart and hip surgeries in 2024, presents for follow-up. He reports a slow recovery from the heart surgery, with his breathing not yet returning to the level it was after the lung surgery. He attributes this to a lack of activity due to worsening hip pain, which has since been addressed with a hip replacement. He has resumed golfing and gym activities, but still experiences shortness of breath.  In addition to these issues, the patient has developed a right inguinal hernia, which he manages with a strap. He has an appointment scheduled with a specialist for  this issue. He also reports persistent constipation, requiring regular use of laxatives. He speculates that this may be due to a change in the connection between his small and large intestine following the surgery.   Outpatient Encounter Medications as of 10/05/2023  Medication Sig   acetaminophen (TYLENOL) 650 MG CR tablet Take 1,300 mg by mouth every 8 (eight) hours as needed for pain (Mild pain).   albuterol (VENTOLIN HFA) 108 (90 Base) MCG/ACT inhaler Inhale 2 puffs into the lungs daily as needed for wheezing or shortness of breath (Depend on activity level).   atorvastatin (LIPITOR) 40 MG tablet Take 1 tablet (40 mg total) by mouth at bedtime.   bisacodyl (DULCOLAX) 5 MG EC tablet Take 5 mg by mouth daily as needed for moderate constipation.   BREZTRI AEROSPHERE 160-9-4.8 MCG/ACT AERO Inhale 2 puffs into the lungs in the morning and at bedtime.   docusate sodium (COLACE) 100 MG capsule Take 100 mg by mouth every other day.   ezetimibe (ZETIA) 10 MG tablet Take 10 mg by mouth at bedtime.   fluorouracil (EFUDEX) 5 % cream Apply 1 Application topically daily as needed (Itch).   GNP IRON 200 (65 Fe) MG TABS Take 200 mg by mouth at bedtime.   HYDROcodone-acetaminophen (NORCO/VICODIN) 5-325 MG tablet Take 1-2 tablets by mouth every 6 (six) hours as needed for moderate pain or severe pain (pain score 4-6).   methocarbamol (ROBAXIN) 500 MG tablet Take 1 tablet (500 mg total) by mouth every 6 (six) hours as needed for muscle spasms.   metoprolol tartrate (LOPRESSOR) 25 MG tablet Take  1 tablet (25 mg total) by mouth 2 (two) times daily.   nitroGLYCERIN (NITROSTAT) 0.4 MG SL tablet Place 0.4 mg under the tongue every 5 (five) minutes as needed for chest pain.   omeprazole (PRILOSEC) 10 MG capsule Take 10 mg by mouth at bedtime.   polyethylene glycol (MIRALAX / GLYCOLAX) 17 g packet Take 17 g by mouth daily as needed for mild constipation.   senna (SENOKOT) 8.6 MG tablet Take 1 tablet by mouth daily as  needed for constipation.   sildenafil (VIAGRA) 100 MG tablet Take 100 mg by mouth daily as needed for erectile dysfunction.   traMADol (ULTRAM) 50 MG tablet Take 1-2 tablets (50-100 mg total) by mouth every 6 (six) hours as needed for moderate pain.   potassium chloride (KLOR-CON) 10 MEQ tablet Take 1 tablet (10 mEq total) by mouth daily.   rivaroxaban (XARELTO) 10 MG TABS tablet Take 1 tablet (10 mg total) by mouth daily with breakfast for 20 days.   torsemide (DEMADEX) 20 MG tablet Take 2 tablets (40 mg total) by mouth daily. (Patient taking differently: Take 20 mg by mouth daily.)   No facility-administered encounter medications on file as of 10/05/2023.    Allergies as of 10/05/2023   (No Known Allergies)    Physical Exam: Blood pressure 130/64, pulse 69, temperature 99 F (37.2 C), temperature source Oral, height 5\' 8"  (1.727 m), weight 173 lb 6.4 oz (78.7 kg), SpO2 95%. Gen:      No acute distress HEENT:  EOMI, sclera anicteric Neck:     No masses; no thyromegaly Lungs:    Clear to auscultation bilaterally; normal respiratory effort CV:         Regular rate and rhythm; no murmurs Abd:      + bowel sounds; soft, non-tender; no palpable masses, no distension Ext:    No edema; adequate peripheral perfusion Skin:      Warm and dry; no rash Neuro: alert and oriented x 3 Psych: normal mood and affect   Data Reviewed: Imaging: CT chest 05/27/2020-s/p right upper lobectomy with loculated fluid, right adrenal adenoma, emphysema.  CT chest 09/01/2021-postsurgical changes in the right upper lobe.  No evidence of disease recurrence.  Stable loculated apical fluid collection.  Right adrenal adenoma.  CT chest 09/04/2022-stable postsurgical changes in the right lung.  Stable opacity in the right apex.  Stable right adrenal adenoma  CTA 05/25/2023-no pulmonary embolism, cardiac enlargement with bilateral pleural effusion, interstitial edema  CT chest 09/02/2023-postoperative changes of right  upper lobectomy, mild scarring in the right hemithorax with chronic pleural thickening and small fluid collection.  I have reviewed the images personally. I have reviewed the images personally.  PFTs: 01/09/2020 FVC 4.11 [97%], FEV1 2.86 [92%], F/F 70, TLC 6.37 [93%], DLCO 18.01 [72%] Mild obstruction, mild diffusion impairment  07/29/2020 FVC 3.23 [7 6%], FEV1 2.17 [70%], F/F 67, TLC 4.36 [63%], DLCO 15.22 [60%] Moderate obstruction with moderate restriction and diffusion defect  Labs: CBC 05/22/2020-WBC 6.1, eos 1%, absolute eosinophil count 62  Assessment:  COPD Lung cancer status post lobectomy He continues to get surveillance CTs with no evidence of recurrence.  Last CT in September 2024 reviewed Finished pulmonary rehab in 2021.  He continues to be active with golfing  Stable on Breztri and [employer]. -Continue current inhalers.  Post-operative recovery from CABG and hip replacement Slow recovery with persistent shortness of breath. No evidence of pulmonary embolism on recent CT scan. -Refer to pulmonary rehab for further evaluation and management.  Inguinal hernia Self-reported right inguinal hernia, managed with a strap. Appointment with a surgeon scheduled for 10/22/2023. -No changes to current management.  Constipation Chronic constipation requiring regular use of laxatives. -No changes to current management.  Plan/Recommendations: Continue breztri.  Take 2 puffs twice daily. Referral to pulmonary rehab, exercise regimen.  Chilton Greathouse MD [facility] 10/05/2023, 2:31 PM  CC: Garlan Fillers, MD

## 2023-10-05 NOTE — Patient Instructions (Signed)
VISIT SUMMARY:  During your recent visit, we discussed your recovery from heart and hip surgeries, your chronic obstructive pulmonary disease (COPD), a hernia, constipation, and your lung cancer. You mentioned that your recovery from the heart surgery has been slow and you're still experiencing shortness of breath. You also mentioned that you have a hernia which you manage with a strap and constipation that requires regular use of laxatives.  YOUR PLAN:  -POST-OPERATIVE RECOVERY FROM HEART AND HIP SURGERIES: You're recovering slower than expected from your heart surgery, and you're still experiencing shortness of breath. We're going to refer you to pulmonary rehab for further evaluation and management. This means you'll work with specialists who can help improve your breathing and overall lung health.  -COPD: Your COPD seems to be stable with your current inhalers, Breztri and ProAir. You should continue using these as prescribed. COPD is a chronic lung disease that can make it hard to breathe.  -INGUINAL HERNIA: You've been managing your hernia with a strap, and you have an appointment with a surgeon on 10/22/2023. We're not making any changes to this plan. A hernia is when an organ pushes through an opening in the muscle or tissue that holds it in place.  -CONSTIPATION: You've been managing your constipation with regular use of laxatives. We're not making any changes to this plan. Constipation is when you have fewer than three bowel movements a week, or hard, dry and small bowel movements that are painful or difficult to pass.  -LUNG CANCER: Your lung cancer appears stable based on your recent CT scan. You should continue with your current follow-up plan. Lung cancer is a type of cancer that begins in the lungs.  INSTRUCTIONS:  Please continue with your current medications and management plans for your COPD, hernia, constipation, and lung cancer. You have been referred to pulmonary rehab for  further evaluation and management of your post-operative recovery. Please ensure you attend your appointment with the surgeon for your hernia on 10/22/2023.

## 2023-10-07 ENCOUNTER — Telehealth (HOSPITAL_COMMUNITY): Payer: Self-pay

## 2023-10-07 NOTE — Telephone Encounter (Signed)
Called patient to see if he was interested in participating in the Cardiac Rehab Program. Patient stated yes. Patient will come in for orientation on 10/12/23 @ 8:30AM and will attend the 6:45AM exercise class. Went over insurance, patient verbalized understanding.    Pensions consultant.

## 2023-10-07 NOTE — Telephone Encounter (Signed)
Pt insurance is active and benefits verified through Medicare A/B. Co-pay $0.00, DED $240.00/$240.00 met, out of pocket $0.00/$0.00 met, co-insurance 20%. No pre-authorization required. Passport, 10/07/23 @ 2:09PM, REF#[id number]   How many CR sessions are covered? (36 visits for TCR, 72 visits for ICR)72 Is this a lifetime maximum or an annual maximum? Lifetime Has the member used any of these services to date? No Is there a time limit (weeks/months) on start of program and/or program completion? No     Will contact patient to see if he is interested in the Cardiac Rehab Program.

## 2023-10-11 ENCOUNTER — Telehealth (HOSPITAL_COMMUNITY): Payer: Self-pay

## 2023-10-11 NOTE — Telephone Encounter (Signed)
LVM for patient regarding Cardiac Rehab appointment tomorrow, awaiting call back

## 2023-10-11 NOTE — Telephone Encounter (Signed)
Pt returned call. Rn able to confirm Cardiac Rehab appointment tomorrow. RN completed Nursing Assessment with patient. Pt denies s/s of COVID. Directions and instructions provided for the appointment. Pt understands without assistance.

## 2023-10-12 ENCOUNTER — Encounter (HOSPITAL_COMMUNITY)
Admission: RE | Admit: 2023-10-12 | Discharge: 2023-10-12 | Disposition: A | Discharge status: Home / Self Care | Payer: Medicare Other | Source: Ambulatory Visit | Attending: Cardiology | Admitting: Cardiology

## 2023-10-12 VITALS — BP 118/60 | HR 57 | Ht 67.75 in | Wt 171.1 lb

## 2023-10-12 DIAGNOSIS — Z951 Presence of aortocoronary bypass graft: Secondary | ICD-10-CM | POA: Insufficient documentation

## 2023-10-12 NOTE — Progress Notes (Signed)
Patient here for cardiac rehab orientation. Patient says he is taking an aspirin 325 mg once a day. Aspirin is not on [facility] current medication list. Will check with Dr Concord Lions to see if the patient should be taking aspirin.Thayer Headings RN BSN

## 2023-10-12 NOTE — Progress Notes (Signed)
Cardiac Rehab Medication Review   Does the patient  feel that his/her medications are working for him/her?  yes  Has the patient been experiencing any side effects to the medications prescribed?  no  Does the patient measure his/her own blood pressure or blood glucose at home?  yes   Does the patient have any problems obtaining medications due to transportation or finances?   no  Understanding of regimen: good Understanding of indications: good Potential of compliance: good    Comments: Pt checks BP about once a month     Lorin Picket 10/12/2023 8:42 AM

## 2023-10-12 NOTE — Progress Notes (Signed)
Cardiac Individual Treatment Plan  Patient Details  Name: Daniel Colon MRN: [id number] Date of Birth: Jun 01, 1946 Referring Provider:   Flowsheet Row INTENSIVE CARDIAC REHAB ORIENT from 10/12/2023 in [facility]  Referring Provider Melany Guernsey       Initial Encounter Date:  Flowsheet Row INTENSIVE CARDIAC REHAB ORIENT from 10/12/2023 in [facility]  Date 10/12/23       Visit Diagnosis: 04/27/23 S/P CABG x 4  Patient's Home Medications on Admission:  Current Outpatient Medications:    acetaminophen (TYLENOL) 650 MG CR tablet, Take 1,300 mg by mouth every 8 (eight) hours as needed for pain (Mild pain)., Disp: , Rfl:    albuterol (VENTOLIN HFA) 108 (90 Base) MCG/ACT inhaler, Inhale 2 puffs into the lungs daily as needed for wheezing or shortness of breath (Depend on activity level)., Disp: , Rfl:    atorvastatin (LIPITOR) 40 MG tablet, Take 1 tablet (40 mg total) by mouth at bedtime., Disp: 90 tablet, Rfl: 3   bisacodyl (DULCOLAX) 5 MG EC tablet, Take 5 mg by mouth daily as needed for moderate constipation., Disp: , Rfl:    BREZTRI AEROSPHERE 160-9-4.8 MCG/ACT AERO, Inhale 2 puffs into the lungs in the morning and at bedtime., Disp: , Rfl:    docusate sodium (COLACE) 100 MG capsule, Take 100 mg by mouth every other day., Disp: , Rfl:    ezetimibe (ZETIA) 10 MG tablet, Take 10 mg by mouth at bedtime., Disp: , Rfl:    fluorouracil (EFUDEX) 5 % cream, Apply 1 Application topically daily as needed (Itch)., Disp: , Rfl:    GNP IRON 200 (65 Fe) MG TABS, Take 200 mg by mouth at bedtime., Disp: , Rfl:    metoprolol tartrate (LOPRESSOR) 25 MG tablet, Take 1 tablet (25 mg total) by mouth 2 (two) times daily., Disp: 180 tablet, Rfl: 3   nitroGLYCERIN (NITROSTAT) 0.4 MG SL tablet, Place 0.4 mg under the tongue every 5 (five) minutes as needed for chest pain., Disp: , Rfl:     omeprazole (PRILOSEC) 10 MG capsule, Take 10 mg by mouth at bedtime., Disp: , Rfl:    polyethylene glycol (MIRALAX / GLYCOLAX) 17 g packet, Take 17 g by mouth daily as needed for mild constipation., Disp: , Rfl:    potassium chloride (KLOR-CON) 10 MEQ tablet, Take 1 tablet (10 mEq total) by mouth daily., Disp: 90 tablet, Rfl: 3   senna (SENOKOT) 8.6 MG tablet, Take 1 tablet by mouth daily as needed for constipation., Disp: , Rfl:    sildenafil (VIAGRA) 100 MG tablet, Take 100 mg by mouth daily as needed for erectile dysfunction., Disp: , Rfl:    torsemide (DEMADEX) 20 MG tablet, Take 2 tablets (40 mg total) by mouth daily. (Patient taking differently: Take 20 mg by mouth daily.), Disp: 60 tablet, Rfl: 3   HYDROcodone-acetaminophen (NORCO/VICODIN) 5-325 MG tablet, Take 1-2 tablets by mouth every 6 (six) hours as needed for moderate pain or severe pain (pain score 4-6). (Patient not taking: Reported on 10/12/2023), Disp: 30 tablet, Rfl: 0   methocarbamol (ROBAXIN) 500 MG tablet, Take 1 tablet (500 mg total) by mouth every 6 (six) hours as needed for muscle spasms. (Patient not taking: Reported on 10/12/2023), Disp: 40 tablet, Rfl: 0   rivaroxaban (XARELTO) 10 MG TABS tablet, Take 1 tablet (10 mg total) by mouth daily with breakfast for 20 days., Disp: 20 tablet, Rfl: 0  traMADol (ULTRAM) 50 MG tablet, Take 1-2 tablets (50-100 mg total) by mouth every 6 (six) hours as needed for moderate pain. (Patient not taking: Reported on 10/12/2023), Disp: 30 tablet, Rfl: 0  Past Medical History: Past Medical History:  Diagnosis Date   Allergy    Arthritis    Barrett's esophagus    CAD (coronary artery disease)    a. Stent to the RCA 1996 (in [place]);  b. ETT(11/13/13): abnormal with early ST changes to suggest ischemia;   c. LHC (11/17/13):  pLAD 30-40, D1 30, mLAD 70-75, mCFX 70, mRCA stent 40 (ISR), EF 55-65%.  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    Tobacco Use: Social History   Tobacco Use  Smoking Status Former   Current packs/day: 0.00   Average packs/day: 1 pack/day for 42.0 years (42.0 ttl pk-yrs)   Types: Cigarettes   Start date: 06/20/1969   Quit date: 06/21/2011   Years since quitting: 12.3  Smokeless Tobacco Former   Types: Chew    Labs: Review Flowsheet  More data exists      Latest Ref Rng & Units 01/24/2020 01/25/2020 04/23/2023 04/27/2023 05/25/2023  Labs for ITP Cardiac and Pulmonary Rehab  Hemoglobin A1c 4.8 - 5.6 % - - 5.2  - -  PH, Arterial 7.35 - 7.45 7.371  7.315  7.255  7.232  7.182  7.172  7.439  7.417  7.46  7.349  7.353  7.371  7.347  7.408  7.347  -  PCO2 arterial 32 - 48 mmHg 41.5  55.8  62.6  67.3  73.8  73.7  35.2  38.5  36  34.4  32.4  35.4  41.3  35.1  37.2  -  Bicarbonate 20.0 - 28.0 mmol/L 24.1  28.6  27.8  28.3  27.7  27.0  24.0  24.7  25.6  18.9  18.1  20.7  22.7  22.1  20.4  -  TCO2 22 - 32 mmol/L 25  30  30  30  30  29  25  26   - 20  19  22  24  24  26  25  24  23  24  22  23  28    Acid-base deficit 0.0 - 2.0 mmol/L 1.0  2.0  3.0  - - 6.0  7.0  4.0  3.0  2.0  5.0  -  O2 Saturation % 94.0  100.0  98.0  98.0  99.0  99.0  95.0  97.0  96.8  99  100  100  100  100  100  -    Details       Multiple values from one day are sorted in reverse-chronological order         Capillary Blood Glucose: Lab Results  Component Value Date   GLUCAP 160 (H) 05/01/2023   GLUCAP 113 (H) 05/01/2023   GLUCAP 109 (H) 04/30/2023   GLUCAP 125 (H) 04/30/2023   GLUCAP 117 (H) 04/30/2023  Exercise Target Goals: Exercise Program Goal: Individual exercise prescription set  using results from initial 6 min walk test and THRR while considering  patient's activity barriers and safety.   Exercise Prescription Goal: Initial exercise prescription builds to 30-45 minutes a day of aerobic activity, 2-3 days per week.  Home exercise guidelines will be given to patient during program as part of exercise prescription that the participant will acknowledge.  Activity Barriers & Risk Stratification:  Activity Barriers & Cardiac Risk Stratification - 10/12/23 1146       Activity Barriers & Cardiac Risk Stratification   Activity Barriers Deconditioning;Muscular Weakness;Shortness of Breath;Back Problems;Neck/Spine Problems;Joint Problems;Balance Concerns    Cardiac Risk Stratification High             6 Minute Walk:  6 Minute Walk     Row Name 10/12/23 0940         6 Minute Walk   Phase Initial     Distance 1080 feet     Walk Time 6 minutes     # of Rest Breaks 0     MPH 2.05     METS 2     RPE 11     Perceived Dyspnea  1     VO2 Peak 6.93     Symptoms Yes (comment)     Comments SOB, RPD =1,  right thigh pain, chronic 3/10     Resting HR 56 bpm     Resting BP 118/60     Resting Oxygen Saturation  96 %     Exercise Oxygen Saturation  during 6 min walk 90 %     Max Ex. HR 64 bpm     Max Ex. BP 130/72     2 Minute Post BP 132/70       Interval HR   1 Minute HR 57     2 Minute HR 60     3 Minute HR 61     4 Minute HR 61     5 Minute HR 62     6 Minute HR 63     2 Minute Post HR 62     Interval Heart Rate? Yes       Interval Oxygen   Interval Oxygen? Yes     Baseline Oxygen Saturation % 96 %     1 Minute Oxygen Saturation % 96 %     1 Minute Liters of Oxygen 0 L     2 Minute Oxygen Saturation % 94 %     2 Minute Liters of Oxygen 0 L     3 Minute Oxygen Saturation % 92 %     3 Minute Liters of Oxygen 0 L     4 Minute Oxygen Saturation % 92 %     4 Minute Liters of Oxygen 0 L     5 Minute Oxygen Saturation % 90 %     5 Minute Liters of  Oxygen 0 L     6 Minute Oxygen Saturation % 97 %     6 Minute Liters of Oxygen 0 L     2 Minute Post Oxygen Saturation % 100 %              Oxygen Initial Assessment:   Oxygen Re-Evaluation:   Oxygen Discharge (Final Oxygen Re-Evaluation):   Initial Exercise Prescription:  Initial Exercise Prescription - 10/12/23 1100       Date of Initial Exercise RX and Referring Provider   Date 10/12/23  Referring Provider Melany Guernsey    Expected Discharge Date 01/05/24      Recumbant Bike   Level 1    RPM 60    Watts 25    Minutes 15    METs 2      NuStep   Level 1    SPM 75    Minutes 15    METs 2      Prescription Details   Frequency (times per week) 3    Duration Progress to 30 minutes of continuous aerobic without signs/symptoms of physical distress      Intensity   THRR 40-80% of Max Heartrate 58-116    Ratings of Perceived Exertion 11-13    Perceived Dyspnea 0-4      Progression   Progression Continue progressive overload as per policy without signs/symptoms or physical distress.      Resistance Training   Training Prescription Yes    Weight 3 lbs    Reps 10-15             Perform Capillary Blood Glucose checks as needed.  Exercise Prescription Changes:   Exercise Comments:   Exercise Goals and Review:   Exercise Goals     Row Name 10/12/23 1145             Exercise Goals   Increase Physical Activity Yes       Intervention Provide advice, education, support and counseling about physical activity/exercise needs.;Develop an individualized exercise prescription for aerobic and resistive training based on initial evaluation findings, risk stratification, comorbidities and participant's personal goals.       Expected Outcomes Short Term: Attend rehab on a regular basis to increase amount of physical activity.;Long Term: Add in home exercise to make exercise part of routine and to increase amount of physical activity.;Long Term:  Exercising regularly at least 3-5 days a week.       Increase Strength and Stamina Yes       Intervention Provide advice, education, support and counseling about physical activity/exercise needs.;Develop an individualized exercise prescription for aerobic and resistive training based on initial evaluation findings, risk stratification, comorbidities and participant's personal goals.       Expected Outcomes Short Term: Increase workloads from initial exercise prescription for resistance, speed, and METs.;Short Term: Perform resistance training exercises routinely during rehab and add in resistance training at home;Long Term: Improve cardiorespiratory fitness, muscular endurance and strength as measured by increased METs and functional capacity ( )       Able to understand and use rate of perceived exertion (RPE) scale Yes       Intervention Provide education and explanation on how to use RPE scale       Expected Outcomes Short Term: Able to use RPE daily in rehab to express subjective intensity level;Long Term:  Able to use RPE to guide intensity level when exercising independently       Able to understand and use Dyspnea scale Yes       Intervention Provide education and explanation on how to use Dyspnea scale       Expected Outcomes Short Term: Able to use Dyspnea scale daily in rehab to express subjective sense of shortness of breath during exertion;Long Term: Able to use Dyspnea scale to guide intensity level when exercising independently       Knowledge and understanding of Target Heart Rate Range (THRR) Yes       Intervention Provide education and explanation of THRR including how the numbers were predicted  and where they are located for reference       Expected Outcomes Short Term: Able to state/look up THRR;Long Term: Able to use THRR to govern intensity when exercising independently;Short Term: Able to use daily as guideline for intensity in rehab       Understanding of Exercise Prescription  Yes       Intervention Provide education, explanation, and written materials on patient's individual exercise prescription       Expected Outcomes Short Term: Able to explain program exercise prescription;Long Term: Able to explain home exercise prescription to exercise independently                Exercise Goals Re-Evaluation :   Discharge Exercise Prescription (Final Exercise Prescription Changes):   Nutrition:  Target Goals: Understanding of nutrition guidelines, daily intake of sodium 1500mg , cholesterol 200mg , calories 30% from fat and 7% or less from saturated fats, daily to have 5 or more servings of fruits and vegetables.  Biometrics:  Pre Biometrics - 10/12/23 0910       Pre Biometrics   Waist Circumference 38 inches    Hip Circumference 40 inches    Waist to Hip Ratio 0.95 %    Triceps Skinfold 9 mm    % Body Fat 24.3 %    Grip Strength 38 kg    Flexibility 0 in    Single Leg Stand 4.6 seconds              Nutrition Therapy Plan and Nutrition Goals:   Nutrition Assessments:  MEDIFICTS Score Key: >=70 Need to make dietary changes  40-70 Heart Healthy Diet <= 40 Therapeutic Level Cholesterol Diet    Picture Your Plate Scores: <19 Unhealthy dietary pattern with much room for improvement. 41-50 Dietary pattern unlikely to meet recommendations for good health and room for improvement. 51-60 More healthful dietary pattern, with some room for improvement.  >60 Healthy dietary pattern, although there may be some specific behaviors that could be improved.    Nutrition Goals Re-Evaluation:   Nutrition Goals Re-Evaluation:   Nutrition Goals Discharge (Final Nutrition Goals Re-Evaluation):   Psychosocial: Target Goals: Acknowledge presence or absence of significant depression and/or stress, maximize coping skills, provide positive support system. Participant is able to verbalize types and ability to use techniques and skills needed for reducing  stress and depression.  Initial Review & Psychosocial Screening:  Initial Psych Review & Screening - 10/12/23 0857       Initial Review   Current issues with None Identified      Family Dynamics   Good Support System? Yes   Pt has spouse Darl Pikes for support     Barriers   Psychosocial barriers to participate in program There are no identifiable barriers or psychosocial needs.      Screening Interventions   Interventions Encouraged to exercise             Quality of Life Scores:  Quality of Life - 10/12/23 1141       Quality of Life   Select Quality of Life      Quality of Life Scores   Health/Function Pre 18.83 %    Socioeconomic Pre 20.17 %    Psych/Spiritual Pre 22.86 %    Family Pre 27.05 %    GLOBAL Pre 21.17 %            Scores of 19 and below usually indicate a poorer quality of life in these areas.  A difference of  2-3  points is a clinically meaningful difference.  A difference of 2-3 points in the total score of the Quality of Life Index has been associated with significant improvement in overall quality of life, self-image, physical symptoms, and general health in studies assessing change in quality of life.  PHQ-9: Review Flowsheet       10/12/2023 08/08/2020 06/05/2020  Depression screen PHQ 2/9  Decreased Interest 0 0 0  Down, Depressed, Hopeless 0 0 0  PHQ - 2 Score 0 0 0  Altered sleeping 1 0 0  Tired, decreased energy 1 0 0  Change in appetite 0 0 0  Feeling bad or failure about yourself  0 0 0  Trouble concentrating 0 0 0  Moving slowly or fidgety/restless 0 0 0  Suicidal thoughts 0 0 0  PHQ-9 Score 2 0 -  Difficult doing work/chores Not difficult at all Not difficult at all Not difficult at all    Details           Interpretation of Total Score  Total Score Depression Severity:  1-4 = Minimal depression, 5-9 = Mild depression, 10-14 = Moderate depression, 15-19 = Moderately severe depression, 20-27 = Severe depression    Psychosocial Evaluation and Intervention:   Psychosocial Re-Evaluation:   Psychosocial Discharge (Final Psychosocial Re-Evaluation):   Vocational Rehabilitation: Provide vocational rehab assistance to qualifying candidates.   Vocational Rehab Evaluation & Intervention:  Vocational Rehab - 10/12/23 0857       Initial Vocational Rehab Evaluation & Intervention   Assessment shows need for Vocational Rehabilitation No   Pt is retired            Education: Education Goals: Education classes will be provided on a weekly basis, covering required topics. Participant will state understanding/return demonstration of topics presented.     Core Videos: Exercise    Move It!  Clinical staff conducted group or individual video education with verbal and written material and guidebook.  Patient learns the recommended Pritikin exercise program. Exercise with the goal of living a long, healthy life. Some of the health benefits of exercise include controlled diabetes, healthier blood pressure levels, improved cholesterol levels, improved heart and lung capacity, improved sleep, and better body composition. Everyone should speak with their doctor before starting or changing an exercise routine.  Biomechanical Limitations Clinical staff conducted group or individual video education with verbal and written material and guidebook.  Patient learns how biomechanical limitations can impact exercise and how we can mitigate and possibly overcome limitations to have an impactful and balanced exercise routine.  Body Composition Clinical staff conducted group or individual video education with verbal and written material and guidebook.  Patient learns that body composition (ratio of muscle mass to fat mass) is a key component to assessing overall fitness, rather than body weight alone. Increased fat mass, especially visceral belly fat, can put [place] at increased risk for metabolic syndrome, type 2  diabetes, heart disease, and even death. It is recommended to combine diet and exercise (cardiovascular and resistance training) to improve your body composition. Seek guidance from your physician and exercise physiologist before implementing an exercise routine.  Exercise Action Plan Clinical staff conducted group or individual video education with verbal and written material and guidebook.  Patient learns the recommended strategies to achieve and enjoy long-term exercise adherence, including variety, self-motivation, self-efficacy, and positive decision making. Benefits of exercise include fitness, good health, weight management, more energy, better sleep, less stress, and overall well-being.  Medical   Heart Disease  Risk Reduction Clinical staff conducted group or individual video education with verbal and written material and guidebook.  Patient learns our heart is our most vital organ as it circulates oxygen, nutrients, white blood cells, and hormones throughout the entire body, and carries waste away. Data supports a plant-based eating plan like the Pritikin Program for its effectiveness in slowing progression of and reversing heart disease. The video provides a number of recommendations to address heart disease.   Metabolic Syndrome and Belly Fat  Clinical staff conducted group or individual video education with verbal and written material and guidebook.  Patient learns what metabolic syndrome is, how it leads to heart disease, and how one can reverse it and keep it from coming back. You have metabolic syndrome if you have 3 of the following 5 criteria: abdominal obesity, high blood pressure, high triglycerides, low HDL cholesterol, and high blood sugar.  Hypertension and Heart Disease Clinical staff conducted group or individual video education with verbal and written material and guidebook.  Patient learns that high blood pressure, or hypertension, is very common in the [place].  Hypertension is largely due to excessive salt intake, but other important risk factors include being overweight, physical inactivity, drinking too much alcohol, smoking, and not eating enough potassium from fruits and vegetables. High blood pressure is a leading risk factor for heart attack, stroke, congestive heart failure, dementia, kidney failure, and premature death. Long-term effects of excessive salt intake include stiffening of the arteries and thickening of heart muscle and organ damage. Recommendations include ways to reduce hypertension and the risk of heart disease.  Diseases of Our Time - Focusing on Diabetes Clinical staff conducted group or individual video education with verbal and written material and guidebook.  Patient learns why the best way to stop diseases of our time is prevention, through food and other lifestyle changes. Medicine (such as prescription pills and surgeries) is often only a Band-Aid on the problem, not a long-term solution. Most common diseases of our time include obesity, type 2 diabetes, hypertension, heart disease, and cancer. The Pritikin Program is recommended and has been proven to help reduce, reverse, and/or prevent the damaging effects of metabolic syndrome.  Nutrition   Overview of the Pritikin Eating Plan  Clinical staff conducted group or individual video education with verbal and written material and guidebook.  Patient learns about the Pritikin Eating Plan for disease risk reduction. The Pritikin Eating Plan emphasizes a wide variety of unrefined, minimally-processed carbohydrates, like fruits, vegetables, whole grains, and legumes. Go, Caution, and Stop food choices are explained. Plant-based and lean animal proteins are emphasized. Rationale provided for low sodium intake for blood pressure control, low added sugars for blood sugar stabilization, and low added fats and oils for coronary artery disease risk reduction and weight management.  Calorie  Density  Clinical staff conducted group or individual video education with verbal and written material and guidebook.  Patient learns about calorie density and how it impacts the Pritikin Eating Plan. Knowing the characteristics of the food you choose will help you decide whether those foods will lead to weight gain or weight loss, and whether you want to consume more or less of them. Weight loss is usually a side effect of the Pritikin Eating Plan because of its focus on low calorie-dense foods.  Label Reading  Clinical staff conducted group or individual video education with verbal and written material and guidebook.  Patient learns about the Pritikin recommended label reading guidelines and corresponding recommendations regarding calorie  density, added sugars, sodium content, and whole grains.  Dining Out - Part 1  Clinical staff conducted group or individual video education with verbal and written material and guidebook.  Patient learns that restaurant meals can be sabotaging because they can be so high in calories, fat, sodium, and/or sugar. Patient learns recommended strategies on how to positively address this and avoid unhealthy pitfalls.  Facts on Fats  Clinical staff conducted group or individual video education with verbal and written material and guidebook.  Patient learns that lifestyle modifications can be just as effective, if not more so, as many medications for lowering your risk of heart disease. A Pritikin lifestyle can help to reduce your risk of inflammation and atherosclerosis (cholesterol build-up, or plaque, in the artery walls). Lifestyle interventions such as dietary choices and physical activity address the cause of atherosclerosis. A review of the types of fats and their impact on blood cholesterol levels, along with dietary recommendations to reduce fat intake is also included.  Nutrition Action Plan  Clinical staff conducted group or individual video education with  verbal and written material and guidebook.  Patient learns how to incorporate Pritikin recommendations into their lifestyle. Recommendations include planning and keeping personal health goals in mind as an important part of their success.  Healthy Mind-Set    Healthy Minds, Bodies, Hearts  Clinical staff conducted group or individual video education with verbal and written material and guidebook.  Patient learns how to identify when they are stressed. Video will discuss the impact of that stress, as well as the many benefits of stress management. Patient will also be introduced to stress management techniques. The way we think, act, and feel has an impact on our hearts.  How Our Thoughts Can Heal Our Hearts  Clinical staff conducted group or individual video education with verbal and written material and guidebook.  Patient learns that negative thoughts can cause depression and anxiety. This can result in negative lifestyle behavior and serious health problems. Cognitive behavioral therapy is an effective method to help control our thoughts in order to change and improve our emotional outlook.  Additional Videos:  Exercise    Improving Performance  Clinical staff conducted group or individual video education with verbal and written material and guidebook.  Patient learns to use a non-linear approach by alternating intensity levels and lengths of time spent exercising to help burn more calories and lose more body fat. Cardiovascular exercise helps improve heart health, metabolism, hormonal balance, blood sugar control, and recovery from fatigue. Resistance training improves strength, endurance, balance, coordination, reaction time, metabolism, and muscle mass. Flexibility exercise improves circulation, posture, and balance. Seek guidance from your physician and exercise physiologist before implementing an exercise routine and learn your capabilities and proper form for all exercise.  Introduction  to Yoga  Clinical staff conducted group or individual video education with verbal and written material and guidebook.  Patient learns about yoga, a discipline of the coming together of mind, breath, and body. The benefits of yoga include improved flexibility, improved range of motion, better posture and core strength, increased lung function, weight loss, and positive self-image. Yoga's heart health benefits include lowered blood pressure, healthier heart rate, decreased cholesterol and triglyceride levels, improved immune function, and reduced stress. Seek guidance from your physician and exercise physiologist before implementing an exercise routine and learn your capabilities and proper form for all exercise.  Medical   Aging: Enhancing Your Quality of Life  Clinical staff conducted group or individual video education with  verbal and written material and guidebook.  Patient learns key strategies and recommendations to stay in good physical health and enhance quality of life, such as prevention strategies, having an advocate, securing a Health Care Proxy and Power of Attorney, and keeping a list of medications and system for tracking them. It also discusses how to avoid risk for bone loss.  Biology of Weight Control  Clinical staff conducted group or individual video education with verbal and written material and guidebook.  Patient learns that weight gain occurs because we consume more calories than we burn (eating more, moving less). Even if your body weight is normal, you may have higher ratios of fat compared to muscle mass. Too much body fat puts you at increased risk for cardiovascular disease, heart attack, stroke, type 2 diabetes, and obesity-related cancers. In addition to exercise, following the Pritikin Eating Plan can help reduce your risk.  Decoding Lab Results  Clinical staff conducted group or individual video education with verbal and written material and guidebook.  Patient learns  that lab test reflects one measurement whose values change over time and are influenced by many factors, including medication, stress, sleep, exercise, food, hydration, pre-existing medical conditions, and more. It is recommended to use the knowledge from this video to become more involved with your lab results and evaluate your numbers to speak with your doctor.   Diseases of Our Time - Overview  Clinical staff conducted group or individual video education with verbal and written material and guidebook.  Patient learns that according to the CDC, 50% to 70% of chronic diseases (such as obesity, type 2 diabetes, elevated lipids, hypertension, and heart disease) are avoidable through lifestyle improvements including healthier food choices, listening to satiety cues, and increased physical activity.  Sleep Disorders Clinical staff conducted group or individual video education with verbal and written material and guidebook.  Patient learns how good quality and duration of sleep are important to overall health and well-being. Patient also learns about sleep disorders and how they impact health along with recommendations to address them, including discussing with a physician.  Nutrition  Dining Out - Part 2 Clinical staff conducted group or individual video education with verbal and written material and guidebook.  Patient learns how to plan ahead and communicate in order to maximize their dining experience in a healthy and nutritious manner. Included are recommended food choices based on the type of restaurant the patient is visiting.   Fueling a Banker conducted group or individual video education with verbal and written material and guidebook.  There is a strong connection between our food choices and our health. Diseases like obesity and type 2 diabetes are very prevalent and are in large-part due to lifestyle choices. The Pritikin Eating Plan provides plenty of food and  hunger-curbing satisfaction. It is easy to follow, affordable, and helps reduce health risks.  Menu Workshop  Clinical staff conducted group or individual video education with verbal and written material and guidebook.  Patient learns that restaurant meals can sabotage health goals because they are often packed with calories, fat, sodium, and sugar. Recommendations include strategies to plan ahead and to communicate with the manager, chef, or server to help order a healthier meal.  Planning Your Eating Strategy  Clinical staff conducted group or individual video education with verbal and written material and guidebook.  Patient learns about the Pritikin Eating Plan and its benefit of reducing the risk of disease. The Pritikin Eating Plan does not focus  on calories. Instead, it emphasizes high-quality, nutrient-rich foods. By knowing the characteristics of the foods, we choose, we can determine their calorie density and make informed decisions.  Targeting Your Nutrition Priorities  Clinical staff conducted group or individual video education with verbal and written material and guidebook.  Patient learns that lifestyle habits have a tremendous impact on disease risk and progression. This video provides eating and physical activity recommendations based on your personal health goals, such as reducing LDL cholesterol, losing weight, preventing or controlling type 2 diabetes, and reducing high blood pressure.  Vitamins and Minerals  Clinical staff conducted group or individual video education with verbal and written material and guidebook.  Patient learns different ways to obtain key vitamins and minerals, including through a recommended healthy diet. It is important to discuss all supplements you take with your doctor.   Healthy Mind-Set    Smoking Cessation  Clinical staff conducted group or individual video education with verbal and written material and guidebook.  Patient learns that cigarette  smoking and tobacco addiction pose a serious health risk which affects millions of people. Stopping smoking will significantly reduce the risk of heart disease, lung disease, and many forms of cancer. Recommended strategies for quitting are covered, including working with your doctor to develop a successful plan.  Culinary   Becoming a Set designer conducted group or individual video education with verbal and written material and guidebook.  Patient learns that cooking at home can be healthy, cost-effective, quick, and puts them in control. Keys to cooking healthy recipes will include looking at your recipe, assessing your equipment needs, planning ahead, making it simple, choosing cost-effective seasonal ingredients, and limiting the use of added fats, salts, and sugars.  Cooking - Breakfast and Snacks  Clinical staff conducted group or individual video education with verbal and written material and guidebook.  Patient learns how important breakfast is to satiety and nutrition through the entire day. Recommendations include key foods to eat during breakfast to help stabilize blood sugar levels and to prevent overeating at meals later in the day. Planning ahead is also a key component.  Cooking - Educational psychologist conducted group or individual video education with verbal and written material and guidebook.  Patient learns eating strategies to improve overall health, including an approach to cook more at home. Recommendations include thinking of animal protein as a side on your plate rather than center stage and focusing instead on lower calorie dense options like vegetables, fruits, whole grains, and plant-based proteins, such as beans. Making sauces in large quantities to freeze for later and leaving the skin on your vegetables are also recommended to maximize your experience.  Cooking - Healthy Salads and Dressing Clinical staff conducted group or individual video  education with verbal and written material and guidebook.  Patient learns that vegetables, fruits, whole grains, and legumes are the foundations of the Pritikin Eating Plan. Recommendations include how to incorporate each of these in flavorful and healthy salads, and how to create homemade salad dressings. Proper handling of ingredients is also covered. Cooking - Soups and [employer] - Soups and Desserts Clinical staff conducted group or individual video education with verbal and written material and guidebook.  Patient learns that Pritikin soups and desserts make for easy, nutritious, and delicious snacks and meal components that are low in sodium, fat, sugar, and calorie density, while high in vitamins, minerals, and filling fiber. Recommendations include simple and healthy ideas for soups  and desserts.   Overview     The Pritikin Solution Program Overview Clinical staff conducted group or individual video education with verbal and written material and guidebook.  Patient learns that the results of the Pritikin Program have been documented in more than 100 articles published in peer-reviewed journals, and the benefits include reducing risk factors for (and, in some cases, even reversing) high cholesterol, high blood pressure, type 2 diabetes, obesity, and more! An overview of the three key pillars of the Pritikin Program will be covered: eating well, doing regular exercise, and having a healthy mind-set.  WORKSHOPS  Exercise: Exercise Basics: Building Your Action Plan Clinical staff led group instruction and group discussion with PowerPoint presentation and patient guidebook. To enhance the learning environment the use of posters, models and videos may be added. At the conclusion of this workshop, patients will comprehend the difference between physical activity and exercise, as well as the benefits of incorporating both, into their routine. Patients will understand the FITT (Frequency,  Intensity, Time, and Type) principle and how to use it to build an exercise action plan. In addition, safety concerns and other considerations for exercise and cardiac rehab will be addressed by the presenter. The purpose of this lesson is to promote a comprehensive and effective weekly exercise routine in order to improve patients' overall level of fitness.   Managing Heart Disease: Your Path to a Healthier Heart Clinical staff led group instruction and group discussion with PowerPoint presentation and patient guidebook. To enhance the learning environment the use of posters, models and videos may be added.At the conclusion of this workshop, patients will understand the anatomy and physiology of the heart. Additionally, they will understand how Pritikin's three pillars impact the risk factors, the progression, and the management of heart disease.  The purpose of this lesson is to provide a high-level overview of the heart, heart disease, and how the Pritikin lifestyle positively impacts risk factors.  Exercise Biomechanics Clinical staff led group instruction and group discussion with PowerPoint presentation and patient guidebook. To enhance the learning environment the use of posters, models and videos may be added. Patients will learn how the structural parts of their bodies function and how these functions impact their daily activities, movement, and exercise. Patients will learn how to promote a neutral spine, learn how to manage pain, and identify ways to improve their physical movement in order to promote healthy living. The purpose of this lesson is to expose patients to common physical limitations that impact physical activity. Participants will learn practical ways to adapt and manage aches and pains, and to minimize their effect on regular exercise. Patients will learn how to maintain good posture while sitting, walking, and lifting.  Balance Training and Fall Prevention  Clinical  staff led group instruction and group discussion with PowerPoint presentation and patient guidebook. To enhance the learning environment the use of posters, models and videos may be added. At the conclusion of this workshop, patients will understand the importance of their sensorimotor skills (vision, proprioception, and the vestibular system) in maintaining their ability to balance as they age. Patients will apply a variety of balancing exercises that are appropriate for their current level of function. Patients will understand the common causes for poor balance, possible solutions to these problems, and ways to modify their physical environment in order to minimize their fall risk. The purpose of this lesson is to teach patients about the importance of maintaining balance as they age and ways to minimize their risk  of falling.  WORKSHOPS   Nutrition:  Fueling a Ship broker led group instruction and group discussion with PowerPoint presentation and patient guidebook. To enhance the learning environment the use of posters, models and videos may be added. Patients will review the foundational principles of the Pritikin Eating Plan and understand what constitutes a serving size in each of the food groups. Patients will also learn Pritikin-friendly foods that are better choices when away from home and review make-ahead meal and snack options. Calorie density will be reviewed and applied to three nutrition priorities: weight maintenance, weight loss, and weight gain. The purpose of this lesson is to reinforce (in a group setting) the key concepts around what patients are recommended to eat and how to apply these guidelines when away from home by planning and selecting Pritikin-friendly options. Patients will understand how calorie density may be adjusted for different weight management goals.  Mindful Eating  Clinical staff led group instruction and group discussion with PowerPoint  presentation and patient guidebook. To enhance the learning environment the use of posters, models and videos may be added. Patients will briefly review the concepts of the Pritikin Eating Plan and the importance of low-calorie dense foods. The concept of mindful eating will be introduced as well as the importance of paying attention to internal hunger signals. Triggers for non-hunger eating and techniques for dealing with triggers will be explored. The purpose of this lesson is to provide patients with the opportunity to review the basic principles of the Pritikin Eating Plan, discuss the value of eating mindfully and how to measure internal cues of hunger and fullness using the Hunger Scale. Patients will also discuss reasons for non-hunger eating and learn strategies to use for controlling emotional eating.  Targeting Your Nutrition Priorities Clinical staff led group instruction and group discussion with PowerPoint presentation and patient guidebook. To enhance the learning environment the use of posters, models and videos may be added. Patients will learn how to determine their genetic susceptibility to disease by reviewing their family history. Patients will gain insight into the importance of diet as part of an overall healthy lifestyle in mitigating the impact of genetics and other environmental insults. The purpose of this lesson is to provide patients with the opportunity to assess their personal nutrition priorities by looking at their family history, their own health history and current risk factors. Patients will also be able to discuss ways of prioritizing and modifying the Pritikin Eating Plan for their highest risk areas  Menu  Clinical staff led group instruction and group discussion with PowerPoint presentation and patient guidebook. To enhance the learning environment the use of posters, models and videos may be added. Using menus brought in from [employer], or printed from [employer], patients will apply the Pritikin dining out guidelines that were presented in the [employer] video. Patients will also be able to practice these guidelines in a variety of provided scenarios. The purpose of this lesson is to provide patients with the opportunity to practice hands-on learning of the Pritikin Dining Out guidelines with actual menus and practice scenarios.  Label Reading Clinical staff led group instruction and group discussion with PowerPoint presentation and patient guidebook. To enhance the learning environment the use of posters, models and videos may be added. Patients will review and discuss the Pritikin label reading guidelines presented in Pritikin's Label Reading Educational series video. Using fool labels brought in from local grocery stores and markets, patients will apply the  label reading guidelines and determine if the packaged food meet the Pritikin guidelines. The purpose of this lesson is to provide patients with the opportunity to review, discuss, and practice hands-on learning of the Pritikin Label Reading guidelines with actual packaged food labels. Cooking School  Pritikin's [employer] are designed to teach patients ways to prepare quick, simple, and affordable recipes at home. The importance of nutrition's role in chronic disease risk reduction is reflected in its emphasis in the overall Pritikin program. By learning how to prepare essential core Pritikin Eating Plan recipes, patients will increase control over what they eat; be able to customize the flavor of foods without the use of added salt, sugar, or fat; and improve the quality of the food they consume. By learning a set of core recipes which are easily assembled, quickly prepared, and affordable, patients are more likely to prepare more healthy foods at home. These workshops focus on convenient breakfasts, simple entres, side dishes, and desserts which can be prepared with  minimal effort and are consistent with nutrition recommendations for cardiovascular risk reduction. Cooking [employer] are taught by a Armed forces logistics/support/administrative officer (RD) who has been trained by the [employer]. The chef or RD has a clear understanding of the importance of minimizing - if not completely eliminating - added fat, sugar, and sodium in recipes. Throughout the series of Cooking School Workshop sessions, patients will learn about healthy ingredients and efficient methods of cooking to build confidence in their capability to prepare    Cooking School weekly topics:  Adding Flavor- Sodium-Free  Fast and Healthy Breakfasts  Powerhouse Plant-Based Proteins  Satisfying Salads and Dressings  Simple Sides and Sauces  International Cuisine-Spotlight on the [employer] Zones  Delicious Desserts  Savory Soups  [employer] - Meals in a Astronomer Appetizers and Snacks  Comforting Weekend Breakfasts  One-Pot Wonders   Fast Evening Meals  Landscape architect Your Pritikin Plate  WORKSHOPS   Healthy Mindset (Psychosocial):  Focused Goals, Sustainable Changes Clinical staff led group instruction and group discussion with PowerPoint presentation and patient guidebook. To enhance the learning environment the use of posters, models and videos may be added. Patients will be able to apply effective goal setting strategies to establish at least one personal goal, and then take consistent, meaningful action toward that goal. They will learn to identify common barriers to achieving personal goals and develop strategies to overcome them. Patients will also gain an understanding of how our mind-set can impact our ability to achieve goals and the importance of cultivating a positive and growth-oriented mind-set. The purpose of this lesson is to provide patients with a deeper understanding of how to set and achieve personal goals, as well as the tools and strategies needed  to overcome common obstacles which may arise along the way.  From Head to Heart: The Power of a Healthy Outlook  Clinical staff led group instruction and group discussion with PowerPoint presentation and patient guidebook. To enhance the learning environment the use of posters, models and videos may be added. Patients will be able to recognize and describe the impact of emotions and mood on physical health. They will discover the importance of self-care and explore self-care practices which may work for them. Patients will also learn how to utilize the 4 C's to cultivate a healthier outlook and better manage stress and challenges. The purpose of this lesson is to demonstrate to patients how a healthy outlook is an essential  part of maintaining good health, especially as they continue their cardiac rehab journey.  Healthy Sleep for a Healthy Heart Clinical staff led group instruction and group discussion with PowerPoint presentation and patient guidebook. To enhance the learning environment the use of posters, models and videos may be added. At the conclusion of this workshop, patients will be able to demonstrate knowledge of the importance of sleep to overall health, well-being, and quality of life. They will understand the symptoms of, and treatments for, common sleep disorders. Patients will also be able to identify daytime and nighttime behaviors which impact sleep, and they will be able to apply these tools to help manage sleep-related challenges. The purpose of this lesson is to provide patients with a general overview of sleep and outline the importance of quality sleep. Patients will learn about a few of the most common sleep disorders. Patients will also be introduced to the concept of "sleep hygiene," and discover ways to self-manage certain sleeping problems through simple daily behavior changes. Finally, the workshop will motivate patients by clarifying the links between quality sleep and their  goals of heart-healthy living.   Recognizing and Reducing Stress Clinical staff led group instruction and group discussion with PowerPoint presentation and patient guidebook. To enhance the learning environment the use of posters, models and videos may be added. At the conclusion of this workshop, patients will be able to understand the types of stress reactions, differentiate between acute and chronic stress, and recognize the impact that chronic stress has on their health. They will also be able to apply different coping mechanisms, such as reframing negative self-talk. Patients will have the opportunity to practice a variety of stress management techniques, such as deep abdominal breathing, progressive muscle relaxation, and/or guided imagery.  The purpose of this lesson is to educate patients on the role of stress in their lives and to provide healthy techniques for coping with it.  Learning Barriers/Preferences:  Learning Barriers/Preferences - 10/12/23 1142       Learning Barriers/Preferences   Learning Barriers None    Learning Preferences Audio;Computer/Internet;Group Instruction;Individual Instruction;Pictoral;Skilled Demonstration;Verbal Instruction;Video;Written Material             Education Topics:  Knowledge Questionnaire Score:  Knowledge Questionnaire Score - 10/12/23 1141       Knowledge Questionnaire Score   Pre Score 19/24             Core Components/Risk Factors/Patient Goals at Admission:  Personal Goals and Risk Factors at Admission - 10/12/23 1142       Core Components/Risk Factors/Patient Goals on Admission   Improve shortness of breath with ADL's Yes    Intervention Provide education, individualized exercise plan and daily activity instruction to help decrease symptoms of SOB with activities of daily living.    Expected Outcomes Short Term: Improve cardiorespiratory fitness to achieve a reduction of symptoms when performing ADLs;Long Term: Be able to  perform more ADLs without symptoms or delay the onset of symptoms    Hypertension Yes    Intervention Provide education on lifestyle modifcations including regular physical activity/exercise, weight management, moderate sodium restriction and increased consumption of fresh fruit, vegetables, and low fat dairy, alcohol moderation, and smoking cessation.;Monitor prescription use compliance.    Expected Outcomes Short Term: Continued assessment and intervention until BP is < 140/21mm HG in hypertensive participants. < 130/69mm HG in hypertensive participants with diabetes, heart failure or chronic kidney disease.;Long Term: Maintenance of blood pressure at goal levels.    Lipids Yes  Intervention Provide education and support for participant on nutrition & aerobic/resistive exercise along with prescribed medications to achieve LDL 70mg , HDL >40mg .    Expected Outcomes Short Term: Participant states understanding of desired cholesterol values and is compliant with medications prescribed. Participant is following exercise prescription and nutrition guidelines.;Long Term: Cholesterol controlled with medications as prescribed, with individualized exercise RX and with personalized nutrition plan. Value goals: LDL < 70mg , HDL > 40 mg.             Core Components/Risk Factors/Patient Goals Review:    Core Components/Risk Factors/Patient Goals at Discharge (Final Review):    ITP Comments:  ITP Comments     Row Name 10/12/23 0852           ITP Comments Armanda Magic, MD: Medical Director.  Introduction to the [facility] / Intensive Cardiac Rehab. Initial orientation packet reviewed with the patient.                Comments: Participant attended orientation for the cardiac rehabilitation program on  10/12/2023  to perform initial intake and exercise walk test. Patient introduced to the [facility] education and orientation packet was reviewed. Completed 6-minute walk  test, measurements, initial ITP, and exercise prescription. Vital signs stable. Telemetry- bradycardia/normal sinus rhythm, rare PVC, asymptomatic.   Service time was from 8:20 to 10:14.

## 2023-10-13 ENCOUNTER — Telehealth (HOSPITAL_COMMUNITY): Payer: Self-pay | Admitting: *Deleted

## 2023-10-13 NOTE — Telephone Encounter (Signed)
-----   Message from Rollene Rotunda sent at 10/12/2023  5:19 PM EDT ----- Regarding: RE: 325 ASA He could be on 81 mg ASA.   Stanton Kidney can we let him know to only be on 81 mg and can we add to med list.  Thanks ----- Message ----- From: Cammy Copa, RN Sent: 10/12/2023   9:01 AM EDT To: Rollene Rotunda, MD; Freddi Starr, RN Subject: 325 ASA                                        Good morning Dr Antoine Poche,  Patient here for cardiac rehab orientation. Patient says he is taking an aspirin 325 mg once a day. Aspirin is not on Mr Chapp current medication list.  Please advise if the patient should continue this medication.  Thanks for your input!  Sincerely, [facility]  Cardiac Rehab

## 2023-10-14 ENCOUNTER — Telehealth: Payer: Self-pay | Admitting: *Deleted

## 2023-10-14 MED ORDER — ASPIRIN 81 MG PO TBEC: 81.0000 mg | DELAYED_RELEASE_TABLET | Freq: Every day | ORAL | Status: AC

## 2023-10-14 NOTE — Telephone Encounter (Signed)
Eft message for patient wife for patient to decrease aspirin to 81 mg once daily.

## 2023-10-14 NOTE — Telephone Encounter (Signed)
-----   Message from Rollene Rotunda sent at 10/12/2023  5:19 PM EDT ----- Regarding: RE: 325 ASA He could be on 81 mg ASA.   Stanton Kidney can we let him know to only be on 81 mg and can we add to med list.  Thanks ----- Message ----- From: Cammy Copa, RN Sent: 10/12/2023   9:01 AM EDT To: Rollene Rotunda, MD; Freddi Starr, RN Subject: 325 ASA                                        Good morning Dr Antoine Poche,  Patient here for cardiac rehab orientation. Patient says he is taking an aspirin 325 mg once a day. Aspirin is not on Mr Chapp current medication list.  Please advise if the patient should continue this medication.  Thanks for your input!  Sincerely, [facility]  Cardiac Rehab

## 2023-10-18 ENCOUNTER — Encounter (HOSPITAL_COMMUNITY)
Admission: RE | Admit: 2023-10-18 | Discharge: 2023-10-18 | Disposition: A | Discharge status: Home / Self Care | Payer: Medicare Other | Source: Ambulatory Visit | Attending: Cardiology | Admitting: Cardiology

## 2023-10-18 DIAGNOSIS — Z951 Presence of aortocoronary bypass graft: Secondary | ICD-10-CM

## 2023-10-18 NOTE — Progress Notes (Signed)
Daily Session Note  Patient Details  Name: Castor Press MRN: [id number] Date of Birth: 1946/08/20 Referring Provider:   Flowsheet Row INTENSIVE CARDIAC REHAB ORIENT from 10/12/2023 in [facility]  Referring Provider Melany Guernsey       Encounter Date: 10/18/2023  Check In:  Session Check In - 10/18/23 0706       Check-In   Supervising physician immediately available to respond to emergencies [facility] MD immediately available    Physician(s) Neila Gear, NP    Location [facility]    Staff Present Cristy Hilts, MS, ACSM-CEP, Exercise Physiologist;Genine Beckett Remus Loffler, RN, MHA;Jetta Walker BS, ACSM-CEP, Exercise Physiologist;Kaylee Earlene Plater, MS, ACSM-CEP, Exercise Physiologist    Virtual Visit No    Medication changes reported     No    Fall or balance concerns reported    No    Tobacco Cessation No Change    Warm-up and Cool-down Performed as group-led instruction    Resistance Training Performed Yes    VAD Patient? No    PAD/SET Patient? No      Pain Assessment   Currently in Pain? No/denies    Pain Score 0-No pain    Multiple Pain Sites No             Capillary Blood Glucose: No results found for this or any previous visit (from the past 24 hour(s)).   Exercise Prescription Changes - 10/18/23 0828       Response to Exercise   Blood Pressure (Admit) 140/70    Blood Pressure (Exercise) 124/64    Blood Pressure (Exit) 128/66    Heart Rate (Admit) 57 bpm    Heart Rate (Exercise) 78 bpm    Heart Rate (Exit) 64 bpm    Rating of Perceived Exertion (Exercise) 12    Perceived Dyspnea (Exercise) 0    Symptoms none    Comments Pt first day in the [facility] program    Duration Progress to 30 minutes of  aerobic without signs/symptoms of physical distress    Intensity THRR unchanged      Progression   Progression Continue to progress workloads to maintain intensity without  signs/symptoms of physical distress.    Average METs 2.5      Resistance Training   Training Prescription Yes    Weight 3 lbs    Reps 10-15    Time 10 Minutes      Recumbant Bike   Level 2    RPM 66    Watts 22    Minutes 15    METs 2.5      NuStep   Level 2    Minutes 15             Social History   Tobacco Use  Smoking Status Former   Current packs/day: 0.00   Average packs/day: 1 pack/day for 42.0 years (42.0 ttl pk-yrs)   Types: Cigarettes   Start date: 06/20/1969   Quit date: 06/21/2011   Years since quitting: 12.3  Smokeless Tobacco Former   Types: Chew    Goals Met:  Independence with exercise equipment Exercise tolerated well No report of concerns or symptoms today Strength training completed today  Goals Unmet:  Not Applicable  Comments: Pt in today for cardiac rehab group sessions. Pt tolerated light exercise without difficulty. VSS, telemetry-Sinus Brady - Normal Sinus Rhythm. Medication list reconciled at last week initial intake and reports no medication changes since  intake. Pt denies barriers to medication compliance.  PSYCHOSOCIAL ASSESSMENT:  PHQ9=2. Pt exhibits positive coping skills, hopeful outlook with supportive family. No psychosocial needs identified at this time, no psychosocial interventions necessary.    Pt enjoys golf.  Pt oriented to exercise equipment and gym routine. Understanding verbalized.

## 2023-10-20 ENCOUNTER — Encounter (HOSPITAL_COMMUNITY)
Admission: RE | Admit: 2023-10-20 | Discharge: 2023-10-20 | Disposition: A | Discharge status: Home / Self Care | Payer: Medicare Other | Source: Ambulatory Visit | Attending: Cardiology | Admitting: Cardiology

## 2023-10-20 DIAGNOSIS — Z951 Presence of aortocoronary bypass graft: Secondary | ICD-10-CM

## 2023-10-22 ENCOUNTER — Encounter (HOSPITAL_COMMUNITY)
Admission: RE | Admit: 2023-10-22 | Discharge: 2023-10-22 | Disposition: A | Discharge status: Home / Self Care | Payer: Medicare Other | Source: Ambulatory Visit | Attending: Cardiology | Admitting: Cardiology

## 2023-10-22 DIAGNOSIS — Z951 Presence of aortocoronary bypass graft: Secondary | ICD-10-CM | POA: Insufficient documentation

## 2023-10-22 DIAGNOSIS — K4090 Unilateral inguinal hernia, without obstruction or gangrene, not specified as recurrent: Secondary | ICD-10-CM | POA: Diagnosis not present

## 2023-10-25 ENCOUNTER — Encounter (HOSPITAL_COMMUNITY)
Admission: RE | Admit: 2023-10-25 | Discharge: 2023-10-25 | Disposition: A | Discharge status: Home / Self Care | Payer: Medicare Other | Source: Ambulatory Visit | Attending: Cardiology | Admitting: Cardiology

## 2023-10-25 DIAGNOSIS — Z951 Presence of aortocoronary bypass graft: Secondary | ICD-10-CM

## 2023-10-27 ENCOUNTER — Encounter (HOSPITAL_COMMUNITY)
Admission: RE | Admit: 2023-10-27 | Discharge: 2023-10-27 | Disposition: A | Discharge status: Home / Self Care | Payer: Medicare Other | Source: Ambulatory Visit | Attending: Cardiology | Admitting: Cardiology

## 2023-10-27 DIAGNOSIS — Z951 Presence of aortocoronary bypass graft: Secondary | ICD-10-CM | POA: Diagnosis not present

## 2023-10-29 ENCOUNTER — Encounter (HOSPITAL_COMMUNITY)
Admission: RE | Admit: 2023-10-29 | Discharge: 2023-10-29 | Disposition: A | Discharge status: Home / Self Care | Payer: Medicare Other | Source: Ambulatory Visit | Attending: Cardiology | Admitting: Cardiology

## 2023-10-29 DIAGNOSIS — Z951 Presence of aortocoronary bypass graft: Secondary | ICD-10-CM

## 2023-11-01 ENCOUNTER — Encounter (HOSPITAL_COMMUNITY)
Admission: RE | Admit: 2023-11-01 | Discharge: 2023-11-01 | Disposition: A | Discharge status: Home / Self Care | Payer: Medicare Other | Source: Ambulatory Visit | Attending: Cardiology | Admitting: Cardiology

## 2023-11-01 DIAGNOSIS — Z951 Presence of aortocoronary bypass graft: Secondary | ICD-10-CM

## 2023-11-01 NOTE — Progress Notes (Signed)
CARDIAC REHAB PHASE 2  Reviewed home exercise with pt today. Pt is tolerating exercise well. Pt will continue to exercise on his own by golfing and going to [employer] for 30-60 minutes per session 3 days a week in addition to the 3 days in [facility]. Advised pt on THRR, RPE scale, hydration and temperature/humidity precautions. Reinforced NTG use, S/S to stop exercise and when to call MD vs 911. Encouraged warm up cool down and stretches with exercise sessions. Pt verbalized understanding, all questions were answered and pt was given a copy to take home.    Harrie Jeans ACSM-CEP 11/01/2023 8:33 AM

## 2023-11-03 ENCOUNTER — Encounter (HOSPITAL_COMMUNITY)
Admission: RE | Admit: 2023-11-03 | Discharge: 2023-11-03 | Disposition: A | Discharge status: Home / Self Care | Payer: Medicare Other | Source: Ambulatory Visit | Attending: Cardiology | Admitting: Cardiology

## 2023-11-03 DIAGNOSIS — Z951 Presence of aortocoronary bypass graft: Secondary | ICD-10-CM

## 2023-11-03 NOTE — Progress Notes (Signed)
Daily Session Note  Patient Details  Name: Daniel Colon MRN: [id number] Date of Birth: 05-29-1946 Referring Provider:   Flowsheet Row INTENSIVE CARDIAC REHAB ORIENT from 10/12/2023 in [facility] for Heart, Vascular, & Lung Health  Referring Provider Melany Guernsey       Encounter Date: 11/03/2023  Check In:  Session Check In - 11/03/23 0700       Check-In   Supervising physician immediately available to respond to emergencies [facility] MD immediately available    Physician(s) Joni Reining, NP    Location [facility]    Staff Present Hughie Closs BS, ACSM-CEP, Exercise Physiologist;Kaylee Earlene Plater, MS, ACSM-CEP, Exercise Physiologist;Malorie Bigford Remus Loffler, RN, MHA;Bailey Wallace Cullens, MS, Exercise Physiologist;Johnny Hale Bogus, MS, Exercise Physiologist    Virtual Visit No    Medication changes reported     No    Fall or balance concerns reported    No    Tobacco Cessation No Change    Current number of cigarettes/nicotine per day     0    Warm-up and Cool-down Performed as group-led instruction    Resistance Training Performed No    VAD Patient? No    PAD/SET Patient? No      Pain Assessment   Currently in Pain? No/denies    Pain Score 0-No pain    Multiple Pain Sites No             Capillary Blood Glucose: No results found for this or any previous visit (from the past 24 hour(s)).    Social History   Tobacco Use  Smoking Status Former   Current packs/day: 0.00   Average packs/day: 1 pack/day for 42.0 years (42.0 ttl pk-yrs)   Types: Cigarettes   Start date: 06/20/1969   Quit date: 06/21/2011   Years since quitting: 12.3  Smokeless Tobacco Former   Types: Chew    Goals Met:  Independence with exercise equipment Exercise tolerated well No report of concerns or symptoms today  Goals Unmet:  Not Applicable  Comments: Today while exercising patient was noted to have a brief change from his usual rhythm. He reported no  symptoms and for the remainder of the session had his usual rhythm.  BP 120/56 while exercising on stepper. Reported taking medications as usual. Joni Reining NP onsite provider, reviewed the ecg tracing as an accelerated junctional rhythm and advised that the patient be seen by his Cardiologist. I informed [name] would notify Dr. Antoine Poche for review and advisement on an office visit. ECG tracing posted in Media tab 11/03/23 cardiac rehab session. Inbox message sent to Dr. Antoine Poche for patient follow-up.    Dr. Armanda Magic is Medical Director for Cardiac Rehab at [facility].

## 2023-11-04 ENCOUNTER — Encounter: Payer: Self-pay | Admitting: Cardiology

## 2023-11-05 ENCOUNTER — Encounter (HOSPITAL_COMMUNITY)
Admission: RE | Admit: 2023-11-05 | Discharge: 2023-11-05 | Disposition: A | Discharge status: Home / Self Care | Payer: Medicare Other | Source: Ambulatory Visit | Attending: Cardiology | Admitting: Cardiology

## 2023-11-05 DIAGNOSIS — Z951 Presence of aortocoronary bypass graft: Secondary | ICD-10-CM

## 2023-11-05 NOTE — Progress Notes (Signed)
Daily Session Note  Patient Details  Name: Daniel Colon MRN: [id number] Date of Birth: Apr 21, 1946 Referring Provider:   Flowsheet Row INTENSIVE CARDIAC REHAB ORIENT from 10/12/2023 in [facility] for Heart, Vascular, & Lung Health  Referring Provider Melany Guernsey       Encounter Date: 11/05/2023  Check In:  Session Check In - 11/05/23 0658       Check-In   Supervising physician immediately available to respond to emergencies [facility] MD immediately available    Physician(s) Bernadene Person, NP    Location [facility]    Staff Present Hughie Closs BS, ACSM-CEP, Exercise Physiologist;Kaylee Earlene Plater, MS, ACSM-CEP, Exercise Physiologist;Farley Crooker Remus Loffler, RN, MHA    Virtual Visit No    Medication changes reported     No    Fall or balance concerns reported    No    Tobacco Cessation No Change    Current number of cigarettes/nicotine per day     0    Warm-up and Cool-down Performed as group-led instruction    Resistance Training Performed Yes    VAD Patient? No    PAD/SET Patient? No      Pain Assessment   Currently in Pain? No/denies    Pain Score 0-No pain    Multiple Pain Sites No             Capillary Blood Glucose: No results found for this or any previous visit (from the past 24 hour(s)).    Social History   Tobacco Use  Smoking Status Former   Current packs/day: 0.00   Average packs/day: 1 pack/day for 42.0 years (42.0 ttl pk-yrs)   Types: Cigarettes   Start date: 06/20/1969   Quit date: 06/21/2011   Years since quitting: 12.3  Smokeless Tobacco Former   Types: Chew    Goals Met:  Rest period half-way through second station to catch breath. Able to complete exercise session with improvements in breathing.  Goals Unmet:  Dyspnea scale 4 while exercising  on second station. Improvements made see note below.  Comments: While exercising on the second 15-minute station half-way through on the recumbent bike, Daniel Colon  reported not being able to catch his breath. Shared he was having a difficult breathing day. He shared also pushing himself with his exercise today and had eaten a heavier breakfast which he typically does not do. Instructed to slow to a stop with exercise and do pursed-lip breathing technique. He shared he did take two puffs of his rescue inhaler prior to the exercise on the recumbent bike.  Pulse oximeter oxygen saturation check revealed 98%RA.  Dyspnea scale rating 4-severe difficulty. After a couple minutes of rest and pursed-lip breathing technique he was feeling less short of breath and breathing pace improved. BP check 160/80 HR 93 NSR.  Dyspnea scale rating 2 - mild difficulty After approximately 10 minutes total with stopping exercise and PLB he felt much better and able to proceed with completing exercise hand-held weights, stretches and relaxation. Exit BP 132/60 SpO2=96%RA, dypnea rating between 0-1, shared almost no shortness of breath at completion of session.  Noted weight 80.5kg today, was 77.1kg on 11/03/23. Daniel Colon reported he has not taken his diuretic in a day and a half but will take later today. Will continue to follow.   Dr. Armanda Magic is Medical Director for Cardiac Rehab at [facility].

## 2023-11-08 ENCOUNTER — Encounter (HOSPITAL_COMMUNITY)
Admission: RE | Admit: 2023-11-08 | Discharge: 2023-11-08 | Disposition: A | Discharge status: Home / Self Care | Payer: Medicare Other | Source: Ambulatory Visit | Attending: Cardiology | Admitting: Cardiology

## 2023-11-08 DIAGNOSIS — Z951 Presence of aortocoronary bypass graft: Secondary | ICD-10-CM | POA: Diagnosis not present

## 2023-11-08 NOTE — Progress Notes (Signed)
Cardiac Individual Treatment Plan  Patient Details  Name: Daniel Colon MRN: [id number] Date of Birth: 1946-07-08 Referring Provider:   Flowsheet Row INTENSIVE CARDIAC REHAB ORIENT from 10/12/2023 in [facility]  Referring Provider Melany Guernsey       Initial Encounter Date:  Flowsheet Row INTENSIVE CARDIAC REHAB ORIENT from 10/12/2023 in [facility] for Heart, Vascular, & Lung Health  Date 10/12/23       Visit Diagnosis: 04/27/23 S/P CABG x 4  Patient's Home Medications on Admission:  Current Outpatient Medications:    acetaminophen (TYLENOL) 650 MG CR tablet, Take 1,300 mg by mouth every 8 (eight) hours as needed for pain (Mild pain)., Disp: , Rfl:    albuterol (VENTOLIN HFA) 108 (90 Base) MCG/ACT inhaler, Inhale 2 puffs into the lungs daily as needed for wheezing or shortness of breath (Depend on activity level)., Disp: , Rfl:    aspirin EC 81 MG tablet, Take 1 tablet (81 mg total) by mouth daily. Swallow whole., Disp: , Rfl:    atorvastatin (LIPITOR) 40 MG tablet, Take 1 tablet (40 mg total) by mouth at bedtime., Disp: 90 tablet, Rfl: 3   bisacodyl (DULCOLAX) 5 MG EC tablet, Take 5 mg by mouth daily as needed for moderate constipation., Disp: , Rfl:    BREZTRI AEROSPHERE 160-9-4.8 MCG/ACT AERO, Inhale 2 puffs into the lungs in the morning and at bedtime., Disp: , Rfl:    docusate sodium (COLACE) 100 MG capsule, Take 100 mg by mouth every other day., Disp: , Rfl:    ezetimibe (ZETIA) 10 MG tablet, Take 10 mg by mouth at bedtime., Disp: , Rfl:    fluorouracil (EFUDEX) 5 % cream, Apply 1 Application topically daily as needed (Itch)., Disp: , Rfl:    GNP IRON 200 (65 Fe) MG TABS, Take 200 mg by mouth at bedtime., Disp: , Rfl:    HYDROcodone-acetaminophen (NORCO/VICODIN) 5-325 MG tablet, Take 1-2 tablets by mouth every 6 (six) hours as needed for moderate pain or severe pain (pain score 4-6).  (Patient not taking: Reported on 10/12/2023), Disp: 30 tablet, Rfl: 0   methocarbamol (ROBAXIN) 500 MG tablet, Take 1 tablet (500 mg total) by mouth every 6 (six) hours as needed for muscle spasms. (Patient not taking: Reported on 10/12/2023), Disp: 40 tablet, Rfl: 0   metoprolol tartrate (LOPRESSOR) 25 MG tablet, Take 1 tablet (25 mg total) by mouth 2 (two) times daily., Disp: 180 tablet, Rfl: 3   nitroGLYCERIN (NITROSTAT) 0.4 MG SL tablet, Place 0.4 mg under the tongue every 5 (five) minutes as needed for chest pain., Disp: , Rfl:    omeprazole (PRILOSEC) 10 MG capsule, Take 10 mg by mouth at bedtime., Disp: , Rfl:    polyethylene glycol (MIRALAX / GLYCOLAX) 17 g packet, Take 17 g by mouth daily as needed for mild constipation., Disp: , Rfl:    potassium chloride (KLOR-CON) 10 MEQ tablet, Take 1 tablet (10 mEq total) by mouth daily., Disp: 90 tablet, Rfl: 3   rivaroxaban (XARELTO) 10 MG TABS tablet, Take 1 tablet (10 mg total) by mouth daily with breakfast for 20 days., Disp: 20 tablet, Rfl: 0   senna (SENOKOT) 8.6 MG tablet, Take 1 tablet by mouth daily as needed for constipation., Disp: , Rfl:    sildenafil (VIAGRA) 100 MG tablet, Take 100 mg by mouth daily as needed for erectile dysfunction., Disp: , Rfl:    torsemide (DEMADEX) 20 MG tablet, Take  2 tablets (40 mg total) by mouth daily. (Patient taking differently: Take 20 mg by mouth daily.), Disp: 60 tablet, Rfl: 3   traMADol (ULTRAM) 50 MG tablet, Take 1-2 tablets (50-100 mg total) by mouth every 6 (six) hours as needed for moderate pain. (Patient not taking: Reported on 10/12/2023), Disp: 30 tablet, Rfl: 0  Past Medical History: Past Medical History:  Diagnosis Date   Allergy    Arthritis    Barrett's esophagus    CAD (coronary artery disease)    a. Stent to the RCA 1996 (in [place]);  b. ETT(11/13/13): abnormal with early ST changes to suggest ischemia;   c. LHC (11/17/13):  pLAD 30-40, D1 30, mLAD 70-75, mCFX 70, mRCA stent 40 (ISR), EF  55-65%.  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    Tobacco Use: Social History   Tobacco Use  Smoking Status Former   Current packs/day: 0.00   Average packs/day: 1 pack/day for 42.0 years (42.0 ttl pk-yrs)   Types: Cigarettes   Start date: 06/20/1969   Quit date: 06/21/2011   Years since quitting: 12.3  Smokeless Tobacco Former   Types: Chew    Labs: Review Flowsheet  More data exists      Latest Ref Rng & Units 01/24/2020 01/25/2020 04/23/2023 04/27/2023 05/25/2023  Labs for ITP Cardiac and Pulmonary Rehab  Hemoglobin A1c 4.8 - 5.6 % - - 5.2  - -  PH, Arterial 7.35 - 7.45 7.371  7.315  7.255  7.232  7.182  7.172  7.439  7.417  7.46  7.349  7.353  7.371  7.347  7.408  7.347  -  PCO2 arterial 32 - 48 mmHg 41.5  55.8  62.6  67.3  73.8  73.7  35.2  38.5  36  34.4  32.4  35.4  41.3  35.1  37.2  -  Bicarbonate 20.0 - 28.0 mmol/L 24.1  28.6  27.8  28.3  27.7  27.0  24.0  24.7  25.6  18.9  18.1  20.7  22.7  22.1  20.4  -  TCO2 22 - 32 mmol/L 25  30  30  30  30  29  25  26   - 20  19  22  24  24  26  25  24  23  24  22  23  28    Acid-base deficit 0.0 - 2.0 mmol/L 1.0  2.0  3.0  - - 6.0  7.0  4.0  3.0  2.0  5.0  -  O2 Saturation % 94.0  100.0  98.0  98.0  99.0  99.0  95.0  97.0  96.8  99  100  100  100  100  100  -    Details       Multiple values from one day are sorted in reverse-chronological order         Capillary Blood Glucose: Lab Results  Component Value Date   GLUCAP 160 (H) 05/01/2023  GLUCAP 113 (H) 05/01/2023   GLUCAP 109 (H) 04/30/2023   GLUCAP 125 (H) 04/30/2023   GLUCAP 117  (H) 04/30/2023     Exercise Target Goals: Exercise Program Goal: Individual exercise prescription set using results from initial 6 min walk test and THRR while considering  patient's activity barriers and safety.   Exercise Prescription Goal: Initial exercise prescription builds to 30-45 minutes a day of aerobic activity, 2-3 days per week.  Home exercise guidelines will be given to patient during program as part of exercise prescription that the participant will acknowledge.  Activity Barriers & Risk Stratification:  Activity Barriers & Cardiac Risk Stratification - 10/12/23 1146       Activity Barriers & Cardiac Risk Stratification   Activity Barriers Deconditioning;Muscular Weakness;Shortness of Breath;Back Problems;Neck/Spine Problems;Joint Problems;Balance Concerns;Right Hip Replacement    Cardiac Risk Stratification High             6 Minute Walk:  6 Minute Walk     Row Name 10/12/23 0940         6 Minute Walk   Phase Initial     Distance 1080 feet     Walk Time 6 minutes     # of Rest Breaks 0     MPH 2.05     METS 2     RPE 11     Perceived Dyspnea  1     VO2 Peak 6.93     Symptoms Yes (comment)     Comments SOB, RPD =1,  right thigh pain, chronic 3/10     Resting HR 56 bpm     Resting BP 118/60     Resting Oxygen Saturation  96 %     Exercise Oxygen Saturation  during 6 min walk 90 %     Max Ex. HR 64 bpm     Max Ex. BP 130/72     2 Minute Post BP 132/70       Interval HR   1 Minute HR 57     2 Minute HR 60     3 Minute HR 61     4 Minute HR 61     5 Minute HR 62     6 Minute HR 63     2 Minute Post HR 62     Interval Heart Rate? Yes       Interval Oxygen   Interval Oxygen? Yes     Baseline Oxygen Saturation % 96 %     1 Minute Oxygen Saturation % 96 %     1 Minute Liters of Oxygen 0 L     2 Minute Oxygen Saturation % 94 %     2 Minute Liters of Oxygen 0 L     3 Minute Oxygen Saturation % 92 %     3 Minute Liters of Oxygen 0 L     4  Minute Oxygen Saturation % 92 %     4 Minute Liters of Oxygen 0 L     5 Minute Oxygen Saturation % 90 %     5 Minute Liters of Oxygen 0 L     6 Minute Oxygen Saturation % 97 %     6 Minute Liters of Oxygen 0 L     2 Minute Post Oxygen Saturation % 100 %              Oxygen Initial Assessment:   Oxygen Re-Evaluation:   Oxygen Discharge (Final Oxygen Re-Evaluation):   Initial  Exercise Prescription:  Initial Exercise Prescription - 10/12/23 1100       Date of Initial Exercise RX and Referring Provider   Date 10/12/23    Referring Provider Melany Guernsey    Expected Discharge Date 01/05/24      Recumbant Bike   Level 1    RPM 60    Watts 25    Minutes 15    METs 2      NuStep   Level 1    SPM 75    Minutes 15    METs 2      Prescription Details   Frequency (times per week) 3    Duration Progress to 30 minutes of continuous aerobic without signs/symptoms of physical distress      Intensity   THRR 40-80% of Max Heartrate 58-116    Ratings of Perceived Exertion 11-13    Perceived Dyspnea 0-4      Progression   Progression Continue progressive overload as per policy without signs/symptoms or physical distress.      Resistance Training   Training Prescription Yes    Weight 3 lbs    Reps 10-15             Perform Capillary Blood Glucose checks as needed.  Exercise Prescription Changes:   Exercise Prescription Changes     Row Name 10/18/23 [phone] 11/01/23 0822 11/08/23 1427         Response to Exercise   Blood Pressure (Admit) 140/70 116/62 120/60     Blood Pressure (Exercise) 124/64 100/50 128/60     Blood Pressure (Exit) 128/66 100/60 118/70     Heart Rate (Admit) 57 bpm 64 bpm 59 bpm     Heart Rate (Exercise) 78 bpm 84 bpm 92 bpm     Heart Rate (Exit) 64 bpm 66 bpm 67 bpm     Rating of Perceived Exertion (Exercise) 12 12 11      Perceived Dyspnea (Exercise) 0 0 0     Symptoms none none none     Comments Pt first day in the [employer]  program Reviewed MET's, goals and home ExRx Reviewed MET's and goals     Duration Progress to 30 minutes of  aerobic without signs/symptoms of physical distress Progress to 30 minutes of  aerobic without signs/symptoms of physical distress Progress to 30 minutes of  aerobic without signs/symptoms of physical distress     Intensity THRR unchanged THRR unchanged THRR unchanged       Progression   Progression Continue to progress workloads to maintain intensity without signs/symptoms of physical distress. Continue to progress workloads to maintain intensity without signs/symptoms of physical distress. Continue to progress workloads to maintain intensity without signs/symptoms of physical distress.     Average METs 2.5 3.35 3.35       Resistance Training   Training Prescription Yes Yes Yes     Weight 3 lbs 4 lbs wts 4 lbs wts     Reps 10-15 10-15 10-15     Time 10 Minutes 10 Minutes 10 Minutes       Recumbant Bike   Level 2 4 4      RPM 66 53 48     Watts 22 42 39     Minutes 15 15 15      METs 2.5 3.3 3.3       NuStep   Level 2 5 6      SPM -- 83 81     Minutes 15 15 15  METs -- 3.4 3.6       Home Exercise Plan   Plans to continue exercise at -- [employer] (comment[facility]     Frequency -- Add 3 additional days to program exercise sessions. Add 3 additional days to program exercise sessions.     Initial Home Exercises Provided -- 11/01/23 11/01/23              Exercise Comments:   Exercise Comments     Row Name 10/18/23 0841 11/01/23 0832 11/08/23 1432       Exercise Comments Pt first day in the [facility] program. Pt tolerated exercise well with an average MET level of 2.5. He is off to a good start and is learning his THRR, REP and ExRx Reviewed MET's, goals and home ExRx. Pt tolerated exercise well with an average MET level of 3.35. Pt is doing well and increasing MET's, He feels good about his goals and is getting better control of his SOB  and is increasing leg strength and stamina. Pt is already meeting ACSM guidlines and is going to [employer] and golfing 3 days for 30-60 mins. Reviewed MET's, and goals . Pt tolerated exercise well with an average MET level of 3.45. Pt is doing well breifly reviewed goals and he is feeling better already. He is feeling good with exercise on his own. Will give additional resourses on gradual weight lifting progression for his workouts at [employer]              Exercise Goals and Review:   Exercise Goals     Row Name 10/12/23 1145             Exercise Goals   Increase Physical Activity Yes       Intervention Provide advice, education, support and counseling about physical activity/exercise needs.;Develop an individualized exercise prescription for aerobic and resistive training based on initial evaluation findings, risk stratification, comorbidities and participant's personal goals.       Expected Outcomes Short Term: Attend rehab on a regular basis to increase amount of physical activity.;Long Term: Add in home exercise to make exercise part of routine and to increase amount of physical activity.;Long Term: Exercising regularly at least 3-5 days a week.       Increase Strength and Stamina Yes       Intervention Provide advice, education, support and counseling about physical activity/exercise needs.;Develop an individualized exercise prescription for aerobic and resistive training based on initial evaluation findings, risk stratification, comorbidities and participant's personal goals.       Expected Outcomes Short Term: Increase workloads from initial exercise prescription for resistance, speed, and METs.;Short Term: Perform resistance training exercises routinely during rehab and add in resistance training at home;Long Term: Improve cardiorespiratory fitness, muscular endurance and strength as measured by increased METs and functional capacity ( )       Able to understand and  use rate of perceived exertion (RPE) scale Yes       Intervention Provide education and explanation on how to use RPE scale       Expected Outcomes Short Term: Able to use RPE daily in rehab to express subjective intensity level;Long Term:  Able to use RPE to guide intensity level when exercising independently       Able to understand and use Dyspnea scale Yes       Intervention Provide education and explanation on how to use Dyspnea scale       Expected Outcomes Short  Term: Able to use Dyspnea scale daily in rehab to express subjective sense of shortness of breath during exertion;Long Term: Able to use Dyspnea scale to guide intensity level when exercising independently       Knowledge and understanding of Target Heart Rate Range (THRR) Yes       Intervention Provide education and explanation of THRR including how the numbers were predicted and where they are located for reference       Expected Outcomes Short Term: Able to state/look up THRR;Long Term: Able to use THRR to govern intensity when exercising independently;Short Term: Able to use daily as guideline for intensity in rehab       Understanding of Exercise Prescription Yes       Intervention Provide education, explanation, and written materials on patient's individual exercise prescription       Expected Outcomes Short Term: Able to explain program exercise prescription;Long Term: Able to explain home exercise prescription to exercise independently                Exercise Goals Re-Evaluation :  Exercise Goals Re-Evaluation     Row Name 10/18/23 0835 11/01/23 0829 11/08/23 1430         Exercise Goal Re-Evaluation   Exercise Goals Review Increase Physical Activity;Understanding of Exercise Prescription;Increase Strength and Stamina;Knowledge and understanding of Target Heart Rate Range (THRR);Able to understand and use rate of perceived exertion (RPE) scale Increase Physical Activity;Understanding of Exercise Prescription;Increase  Strength and Stamina;Knowledge and understanding of Target Heart Rate Range (THRR);Able to understand and use rate of perceived exertion (RPE) scale Increase Physical Activity;Understanding of Exercise Prescription;Increase Strength and Stamina;Knowledge and understanding of Target Heart Rate Range (THRR);Able to understand and use rate of perceived exertion (RPE) scale     Comments Pt first day in the [facility] program. Pt tolerated exercise well with an average MET level of 2.5. He is off to a good start and is learning his THRR, REP and ExRx Reviewed MET's, goals and home ExRx. Pt tolerated exercise well with an average MEt level of 3.35. Pt is doing well and increasing MET's, He feels good about his goals and is getting better control of his SOB and is increasing leg strength and stamina. Pt is already meeting ACSM guidlines and is going to [employer] and golfing 3 days for 30-60 mins. Reviewed MET's, and goals . Pt tolerated exercise well with an average MET level of 3.45. Pt is doing well breifly reviewed goals and he is feeling better already. He is feeling good with exercise on his own. Will give additional resourses on gradual weight lifting progression for his workouts at [employer]     Expected Outcomes Will continue to monitor pt and progress workloads as tolerated without sign or symptom Will continue to monitor pt and progress workloads as tolerated without sign or symptom Will continue to monitor pt and progress workloads as tolerated without sign or symptom              Discharge Exercise Prescription (Final Exercise Prescription Changes):  Exercise Prescription Changes - 11/08/23 1427       Response to Exercise   Blood Pressure (Admit) 120/60    Blood Pressure (Exercise) 128/60    Blood Pressure (Exit) 118/70    Heart Rate (Admit) 59 bpm    Heart Rate (Exercise) 92 bpm    Heart Rate (Exit) 67 bpm    Rating of Perceived Exertion (Exercise) 11    Perceived Dyspnea  (Exercise) 0  Symptoms none    Comments Reviewed MET's and goals    Duration Progress to 30 minutes of  aerobic without signs/symptoms of physical distress    Intensity THRR unchanged      Progression   Progression Continue to progress workloads to maintain intensity without signs/symptoms of physical distress.    Average METs 3.35      Resistance Training   Training Prescription Yes    Weight 4 lbs wts    Reps 10-15    Time 10 Minutes      Recumbant Bike   Level 4    RPM 48    Watts 39    Minutes 15    METs 3.3      NuStep   Level 6    SPM 81    Minutes 15    METs 3.6      Home Exercise Plan   Plans to continue exercise at [employer] (comment)    Frequency Add 3 additional days to program exercise sessions.    Initial Home Exercises Provided 11/01/23             Nutrition:  Target Goals: Understanding of nutrition guidelines, daily intake of sodium 1500mg , cholesterol 200mg , calories 30% from fat and 7% or less from saturated fats, daily to have 5 or more servings of fruits and vegetables.  Biometrics:  Pre Biometrics - 10/12/23 0910       Pre Biometrics   Waist Circumference 38 inches    Hip Circumference 40 inches    Waist to Hip Ratio 0.95 %    Triceps Skinfold 9 mm    % Body Fat 24.3 %    Grip Strength 38 kg    Flexibility 0 in    Single Leg Stand 4.6 seconds              Nutrition Therapy Plan and Nutrition Goals:  Nutrition Therapy & Goals - 10/18/23 1159       Nutrition Therapy   Diet Heart Healthy Diet    Drug/Food Interactions Statins/Certain Fruits      Personal Nutrition Goals   Nutrition Goal Patient to identify strategies for reducing cardiovascular risk by attending the [facility] education and nutrition series weekly.    Personal Goal #2 Patient to improve diet quality by using the plate method as a guide for meal planning to include lean protein/plant protein, fruits, vegetables, whole grains, nonfat dairy as part of  a well-balanced diet.    Personal Goal #3 Patient to reduce sodium to 1500mg  per day.    Comments Daniel Colon reports no nutrition concerns at this time. He has previously completed cardiac rehab in 2021. He has medical history of CABG x4, CAD, hyperlipidemia, HTN, Barrett's Esophagus, COPD, lung cancer. Per cardiology notes on 09/08/23, his LDL is <55 (53) and well controlled. Patient will benefit from participation in intensive cardiac rehab for nutrition, exercise, and lifestyle modification.      Intervention Plan   Intervention Nutrition handout(s) given to patient.;Prescribe, educate and counsel regarding individualized specific dietary modifications aiming towards targeted core components such as weight, hypertension, lipid management, diabetes, heart failure and other comorbidities.    Expected Outcomes Short Term Goal: Understand basic principles of dietary content, such as calories, fat, sodium, cholesterol and nutrients.;Long Term Goal: Adherence to prescribed nutrition plan.             Nutrition Assessments:  Nutrition Assessments - 10/21/23 0907       Rate Your Plate Scores  Pre Score 56            MEDIFICTS Score Key: >=70 Need to make dietary changes  40-70 Heart Healthy Diet <= 40 Therapeutic Level Cholesterol Diet   Flowsheet Row INTENSIVE CARDIAC REHAB from 10/20/2023 in [facility]  Picture Your Plate Total Score on Admission 56      Picture Your Plate Scores: <16 Unhealthy dietary pattern with much room for improvement. 41-50 Dietary pattern unlikely to meet recommendations for good health and room for improvement. 51-60 More healthful dietary pattern, with some room for improvement.  >60 Healthy dietary pattern, although there may be some specific behaviors that could be improved.    Nutrition Goals Re-Evaluation:  Nutrition Goals Re-Evaluation     Row Name 10/18/23 1159              Goals   Current Weight 171 lb 8.3 oz (77.8 kg)       Comment A1c WNL, lipoprotein a 170, LDL 53, HDL 47       Expected Outcome Daniel Colon reports no nutrition concerns at this time. He has previously completed cardiac rehab in 2021. He has medical history of CABG x4, CAD, hyperlipidemia, HTN, Barrett's Esophagus, COPD, lung cancer. Per cardiology notes on 09/08/23, his LDL is <55 (53) and well controlled. Patient will benefit from participation in intensive cardiac rehab for nutrition, exercise, and lifestyle modification.                Nutrition Goals Re-Evaluation:  Nutrition Goals Re-Evaluation     Row Name 10/18/23 1159             Goals   Current Weight 171 lb 8.3 oz (77.8 kg)       Comment A1c WNL, lipoprotein a 170, LDL 53, HDL 47       Expected Outcome Daniel Colon reports no nutrition concerns at this time. He has previously completed cardiac rehab in 2021. He has medical history of CABG x4, CAD, hyperlipidemia, HTN, Barrett's Esophagus, COPD, lung cancer. Per cardiology notes on 09/08/23, his LDL is <55 (53) and well controlled. Patient will benefit from participation in intensive cardiac rehab for nutrition, exercise, and lifestyle modification.                Nutrition Goals Discharge (Final Nutrition Goals Re-Evaluation):  Nutrition Goals Re-Evaluation - 10/18/23 1159       Goals   Current Weight 171 lb 8.3 oz (77.8 kg)    Comment A1c WNL, lipoprotein a 170, LDL 53, HDL 47    Expected Outcome Daniel Colon reports no nutrition concerns at this time. He has previously completed cardiac rehab in 2021. He has medical history of CABG x4, CAD, hyperlipidemia, HTN, Barrett's Esophagus, COPD, lung cancer. Per cardiology notes on 09/08/23, his LDL is <55 (53) and well controlled. Patient will benefit from participation in intensive cardiac rehab for nutrition, exercise, and lifestyle modification.             Psychosocial: Target Goals: Acknowledge presence or absence of significant  depression and/or stress, maximize coping skills, provide positive support system. Participant is able to verbalize types and ability to use techniques and skills needed for reducing stress and depression.  Initial Review & Psychosocial Screening:  Initial Psych Review & Screening - 10/12/23 0857       Initial Review   Current issues with None Identified      Family Dynamics   Good Support System? Yes  Pt has spouse Darl Pikes for support     Barriers   Psychosocial barriers to participate in program There are no identifiable barriers or psychosocial needs.      Screening Interventions   Interventions Encouraged to exercise             Quality of Life Scores:  Quality of Life - 10/12/23 1141       Quality of Life   Select Quality of Life      Quality of Life Scores   Health/Function Pre 18.83 %    Socioeconomic Pre 20.17 %    Psych/Spiritual Pre 22.86 %    Family Pre 27.05 %    GLOBAL Pre 21.17 %            Scores of 19 and below usually indicate a poorer quality of life in these areas.  A difference of  2-3 points is a clinically meaningful difference.  A difference of 2-3 points in the total score of the Quality of Life Index has been associated with significant improvement in overall quality of life, self-image, physical symptoms, and general health in studies assessing change in quality of life.  PHQ-9: Review Flowsheet       10/12/2023 08/08/2020 06/05/2020  Depression screen PHQ 2/9  Decreased Interest 0 0 0  Down, Depressed, Hopeless 0 0 0  PHQ - 2 Score 0 0 0  Altered sleeping 1 0 0  Tired, decreased energy 1 0 0  Change in appetite 0 0 0  Feeling bad or failure about yourself  0 0 0  Trouble concentrating 0 0 0  Moving slowly or fidgety/restless 0 0 0  Suicidal thoughts 0 0 0  PHQ-9 Score 2 0 -  Difficult doing work/chores Not difficult at all Not difficult at all Not difficult at all    Details           Interpretation of Total Score  Total  Score Depression Severity:  1-4 = Minimal depression, 5-9 = Mild depression, 10-14 = Moderate depression, 15-19 = Moderately severe depression, 20-27 = Severe depression   Psychosocial Evaluation and Intervention:   Psychosocial Re-Evaluation:  Psychosocial Re-Evaluation     Row Name 11/08/23 1732             Psychosocial Re-Evaluation   Current issues with None Identified       Interventions Encouraged to attend Cardiac Rehabilitation for the exercise       Continue Psychosocial Services  No Follow up required                Psychosocial Discharge (Final Psychosocial Re-Evaluation):  Psychosocial Re-Evaluation - 11/08/23 1732       Psychosocial Re-Evaluation   Current issues with None Identified    Interventions Encouraged to attend Cardiac Rehabilitation for the exercise    Continue Psychosocial Services  No Follow up required             Vocational Rehabilitation: Provide vocational rehab assistance to qualifying candidates.   Vocational Rehab Evaluation & Intervention:  Vocational Rehab - 10/12/23 0857       Initial Vocational Rehab Evaluation & Intervention   Assessment shows need for Vocational Rehabilitation No   Pt is retired            Education: Education Goals: Education classes will be provided on a weekly basis, covering required topics. Participant will state understanding/return demonstration of topics presented.    Education     Row Name 10/20/23 0900  Education   Cardiac Education Topics Pritikin   Customer service manager   Weekly Topic Simple Sides and Sauces   Instruction Review Code 1- Verbalizes Understanding   Class Start Time 0815   Class Stop Time 0900   Class Time Calculation (min) 45 min    Row Name 10/25/23 0900     Education   Cardiac Education Topics Pritikin   [employer] Workshops     Workshops   Educator Exercise Physiologist   Select Exercise   Exercise Workshop  Managing Heart Disease: Your Path to a Healthier Heart   Instruction Review Code 1- Verbalizes Understanding   Class Start Time [phone]   Class Stop Time 0848   Class Time Calculation (min) 37 min    Row Name 10/27/23 0900     Education   Cardiac Education Topics Pritikin   Secondary school teacher School   Educator Dietitian   Weekly Topic Powerhouse Plant-Based Proteins   Instruction Review Code 1- Verbalizes Understanding   Class Start Time 0815   Class Stop Time 0858   Class Time Calculation (min) 43 min    Row Name 10/29/23 1400     Education   Cardiac Education Topics Pritikin   Psychologist, forensic General Education   General Education Hypertension and Heart Disease   Instruction Review Code 1- Verbalizes Understanding   Class Start Time 0815   Class Stop Time 0903   Class Time Calculation (min) 48 min    Row Name 11/01/23 1500     Education   Cardiac Education Topics Pritikin   Geographical information systems officer Psychosocial   Psychosocial Workshop From Head to Heart: The Power of a Healthy Outlook   Instruction Review Code 1- Verbalizes Understanding   Class Start Time [phone]   Class Stop Time 0901   Class Time Calculation (min) 47 min    Row Name 11/08/23 1000     Education   Cardiac Education Topics Pritikin   [employer] Workshops     Workshops   Educator Exercise Physiologist   Select Exercise   Exercise Workshop Location manager and Fall Prevention   Instruction Review Code 1- Verbalizes Understanding   Class Start Time 0820   Class Stop Time 0905   Class Time Calculation (min) 45 min            Core Videos: Exercise    Move It!  Clinical staff conducted group or individual video education with verbal and written material and guidebook.  Patient learns the recommended Pritikin exercise program. Exercise with the goal of living a long,  healthy life. Some of the health benefits of exercise include controlled diabetes, healthier blood pressure levels, improved cholesterol levels, improved heart and lung capacity, improved sleep, and better body composition. Everyone should speak with their doctor before starting or changing an exercise routine.  Biomechanical Limitations Clinical staff conducted group or individual video education with verbal and written material and guidebook.  Patient learns how biomechanical limitations can impact exercise and how we can mitigate and possibly overcome limitations to have an impactful and balanced exercise routine.  Body Composition Clinical staff conducted group or individual video education with verbal and written material and guidebook.  Patient learns that body composition (ratio of muscle mass to fat mass)  is a key component to assessing overall fitness, rather than body weight alone. Increased fat mass, especially visceral belly fat, can put [place] at increased risk for metabolic syndrome, type 2 diabetes, heart disease, and even death. It is recommended to combine diet and exercise (cardiovascular and resistance training) to improve your body composition. Seek guidance from your physician and exercise physiologist before implementing an exercise routine.  Exercise Action Plan Clinical staff conducted group or individual video education with verbal and written material and guidebook.  Patient learns the recommended strategies to achieve and enjoy long-term exercise adherence, including variety, self-motivation, self-efficacy, and positive decision making. Benefits of exercise include fitness, good health, weight management, more energy, better sleep, less stress, and overall well-being.  Medical   Heart Disease Risk Reduction Clinical staff conducted group or individual video education with verbal and written material and guidebook.  Patient learns our heart is our most vital organ as it  circulates oxygen, nutrients, white blood cells, and hormones throughout the entire body, and carries waste away. Data supports a plant-based eating plan like the Pritikin Program for its effectiveness in slowing progression of and reversing heart disease. The video provides a number of recommendations to address heart disease.   Metabolic Syndrome and Belly Fat  Clinical staff conducted group or individual video education with verbal and written material and guidebook.  Patient learns what metabolic syndrome is, how it leads to heart disease, and how one can reverse it and keep it from coming back. You have metabolic syndrome if you have 3 of the following 5 criteria: abdominal obesity, high blood pressure, high triglycerides, low HDL cholesterol, and high blood sugar.  Hypertension and Heart Disease Clinical staff conducted group or individual video education with verbal and written material and guidebook.  Patient learns that high blood pressure, or hypertension, is very common in the [place]. Hypertension is largely due to excessive salt intake, but other important risk factors include being overweight, physical inactivity, drinking too much alcohol, smoking, and not eating enough potassium from fruits and vegetables. High blood pressure is a leading risk factor for heart attack, stroke, congestive heart failure, dementia, kidney failure, and premature death. Long-term effects of excessive salt intake include stiffening of the arteries and thickening of heart muscle and organ damage. Recommendations include ways to reduce hypertension and the risk of heart disease.  Diseases of Our Time - Focusing on Diabetes Clinical staff conducted group or individual video education with verbal and written material and guidebook.  Patient learns why the best way to stop diseases of our time is prevention, through food and other lifestyle changes. Medicine (such as prescription pills and surgeries) is often  only a Band-Aid on the problem, not a long-term solution. Most common diseases of our time include obesity, type 2 diabetes, hypertension, heart disease, and cancer. The Pritikin Program is recommended and has been proven to help reduce, reverse, and/or prevent the damaging effects of metabolic syndrome.  Nutrition   Overview of the Pritikin Eating Plan  Clinical staff conducted group or individual video education with verbal and written material and guidebook.  Patient learns about the Pritikin Eating Plan for disease risk reduction. The Pritikin Eating Plan emphasizes a wide variety of unrefined, minimally-processed carbohydrates, like fruits, vegetables, whole grains, and legumes. Go, Caution, and Stop food choices are explained. Plant-based and lean animal proteins are emphasized. Rationale provided for low sodium intake for blood pressure control, low added sugars for blood sugar stabilization, and low added fats and  oils for coronary artery disease risk reduction and weight management.  Calorie Density  Clinical staff conducted group or individual video education with verbal and written material and guidebook.  Patient learns about calorie density and how it impacts the Pritikin Eating Plan. Knowing the characteristics of the food you choose will help you decide whether those foods will lead to weight gain or weight loss, and whether you want to consume more or less of them. Weight loss is usually a side effect of the Pritikin Eating Plan because of its focus on low calorie-dense foods.  Label Reading  Clinical staff conducted group or individual video education with verbal and written material and guidebook.  Patient learns about the Pritikin recommended label reading guidelines and corresponding recommendations regarding calorie density, added sugars, sodium content, and whole grains.  Dining Out - Part 1  Clinical staff conducted group or individual video education with verbal and written  material and guidebook.  Patient learns that restaurant meals can be sabotaging because they can be so high in calories, fat, sodium, and/or sugar. Patient learns recommended strategies on how to positively address this and avoid unhealthy pitfalls.  Facts on Fats  Clinical staff conducted group or individual video education with verbal and written material and guidebook.  Patient learns that lifestyle modifications can be just as effective, if not more so, as many medications for lowering your risk of heart disease. A Pritikin lifestyle can help to reduce your risk of inflammation and atherosclerosis (cholesterol build-up, or plaque, in the artery walls). Lifestyle interventions such as dietary choices and physical activity address the cause of atherosclerosis. A review of the types of fats and their impact on blood cholesterol levels, along with dietary recommendations to reduce fat intake is also included.  Nutrition Action Plan  Clinical staff conducted group or individual video education with verbal and written material and guidebook.  Patient learns how to incorporate Pritikin recommendations into their lifestyle. Recommendations include planning and keeping personal health goals in mind as an important part of their success.  Healthy Mind-Set    Healthy Minds, Bodies, Hearts  Clinical staff conducted group or individual video education with verbal and written material and guidebook.  Patient learns how to identify when they are stressed. Video will discuss the impact of that stress, as well as the many benefits of stress management. Patient will also be introduced to stress management techniques. The way we think, act, and feel has an impact on our hearts.  How Our Thoughts Can Heal Our Hearts  Clinical staff conducted group or individual video education with verbal and written material and guidebook.  Patient learns that negative thoughts can cause depression and anxiety. This can result in  negative lifestyle behavior and serious health problems. Cognitive behavioral therapy is an effective method to help control our thoughts in order to change and improve our emotional outlook.  Additional Videos:  Exercise    Improving Performance  Clinical staff conducted group or individual video education with verbal and written material and guidebook.  Patient learns to use a non-linear approach by alternating intensity levels and lengths of time spent exercising to help burn more calories and lose more body fat. Cardiovascular exercise helps improve heart health, metabolism, hormonal balance, blood sugar control, and recovery from fatigue. Resistance training improves strength, endurance, balance, coordination, reaction time, metabolism, and muscle mass. Flexibility exercise improves circulation, posture, and balance. Seek guidance from your physician and exercise physiologist before implementing an exercise routine and learn your capabilities  and proper form for all exercise.  Introduction to Yoga  Clinical staff conducted group or individual video education with verbal and written material and guidebook.  Patient learns about yoga, a discipline of the coming together of mind, breath, and body. The benefits of yoga include improved flexibility, improved range of motion, better posture and core strength, increased lung function, weight loss, and positive self-image. Yoga's heart health benefits include lowered blood pressure, healthier heart rate, decreased cholesterol and triglyceride levels, improved immune function, and reduced stress. Seek guidance from your physician and exercise physiologist before implementing an exercise routine and learn your capabilities and proper form for all exercise.  Medical   Aging: Enhancing Your Quality of Life  Clinical staff conducted group or individual video education with verbal and written material and guidebook.  Patient learns key strategies and  recommendations to stay in good physical health and enhance quality of life, such as prevention strategies, having an advocate, securing a Health Care Proxy and Power of Attorney, and keeping a list of medications and system for tracking them. It also discusses how to avoid risk for bone loss.  Biology of Weight Control  Clinical staff conducted group or individual video education with verbal and written material and guidebook.  Patient learns that weight gain occurs because we consume more calories than we burn (eating more, moving less). Even if your body weight is normal, you may have higher ratios of fat compared to muscle mass. Too much body fat puts you at increased risk for cardiovascular disease, heart attack, stroke, type 2 diabetes, and obesity-related cancers. In addition to exercise, following the Pritikin Eating Plan can help reduce your risk.  Decoding Lab Results  Clinical staff conducted group or individual video education with verbal and written material and guidebook.  Patient learns that lab test reflects one measurement whose values change over time and are influenced by many factors, including medication, stress, sleep, exercise, food, hydration, pre-existing medical conditions, and more. It is recommended to use the knowledge from this video to become more involved with your lab results and evaluate your numbers to speak with your doctor.   Diseases of Our Time - Overview  Clinical staff conducted group or individual video education with verbal and written material and guidebook.  Patient learns that according to the CDC, 50% to 70% of chronic diseases (such as obesity, type 2 diabetes, elevated lipids, hypertension, and heart disease) are avoidable through lifestyle improvements including healthier food choices, listening to satiety cues, and increased physical activity.  Sleep Disorders Clinical staff conducted group or individual video education with verbal and written  material and guidebook.  Patient learns how good quality and duration of sleep are important to overall health and well-being. Patient also learns about sleep disorders and how they impact health along with recommendations to address them, including discussing with a physician.  Nutrition  Dining Out - Part 2 Clinical staff conducted group or individual video education with verbal and written material and guidebook.  Patient learns how to plan ahead and communicate in order to maximize their dining experience in a healthy and nutritious manner. Included are recommended food choices based on the type of restaurant the patient is visiting.   Fueling a Banker conducted group or individual video education with verbal and written material and guidebook.  There is a strong connection between our food choices and our health. Diseases like obesity and type 2 diabetes are very prevalent and are in large-part due  to lifestyle choices. The Pritikin Eating Plan provides plenty of food and hunger-curbing satisfaction. It is easy to follow, affordable, and helps reduce health risks.  Menu Workshop  Clinical staff conducted group or individual video education with verbal and written material and guidebook.  Patient learns that restaurant meals can sabotage health goals because they are often packed with calories, fat, sodium, and sugar. Recommendations include strategies to plan ahead and to communicate with the manager, chef, or server to help order a healthier meal.  Planning Your Eating Strategy  Clinical staff conducted group or individual video education with verbal and written material and guidebook.  Patient learns about the Pritikin Eating Plan and its benefit of reducing the risk of disease. The Pritikin Eating Plan does not focus on calories. Instead, it emphasizes high-quality, nutrient-rich foods. By knowing the characteristics of the foods, we choose, we can determine their  calorie density and make informed decisions.  Targeting Your Nutrition Priorities  Clinical staff conducted group or individual video education with verbal and written material and guidebook.  Patient learns that lifestyle habits have a tremendous impact on disease risk and progression. This video provides eating and physical activity recommendations based on your personal health goals, such as reducing LDL cholesterol, losing weight, preventing or controlling type 2 diabetes, and reducing high blood pressure.  Vitamins and Minerals  Clinical staff conducted group or individual video education with verbal and written material and guidebook.  Patient learns different ways to obtain key vitamins and minerals, including through a recommended healthy diet. It is important to discuss all supplements you take with your doctor.   Healthy Mind-Set    Smoking Cessation  Clinical staff conducted group or individual video education with verbal and written material and guidebook.  Patient learns that cigarette smoking and tobacco addiction pose a serious health risk which affects millions of people. Stopping smoking will significantly reduce the risk of heart disease, lung disease, and many forms of cancer. Recommended strategies for quitting are covered, including working with your doctor to develop a successful plan.  Culinary   Becoming a Set designer conducted group or individual video education with verbal and written material and guidebook.  Patient learns that cooking at home can be healthy, cost-effective, quick, and puts them in control. Keys to cooking healthy recipes will include looking at your recipe, assessing your equipment needs, planning ahead, making it simple, choosing cost-effective seasonal ingredients, and limiting the use of added fats, salts, and sugars.  Cooking - Breakfast and Snacks  Clinical staff conducted group or individual video education with verbal and  written material and guidebook.  Patient learns how important breakfast is to satiety and nutrition through the entire day. Recommendations include key foods to eat during breakfast to help stabilize blood sugar levels and to prevent overeating at meals later in the day. Planning ahead is also a key component.  Cooking - Educational psychologist conducted group or individual video education with verbal and written material and guidebook.  Patient learns eating strategies to improve overall health, including an approach to cook more at home. Recommendations include thinking of animal protein as a side on your plate rather than center stage and focusing instead on lower calorie dense options like vegetables, fruits, whole grains, and plant-based proteins, such as beans. Making sauces in large quantities to freeze for later and leaving the skin on your vegetables are also recommended to maximize your experience.  Cooking - Healthy Salads and  Dressing Clinical staff conducted group or individual video education with verbal and written material and guidebook.  Patient learns that vegetables, fruits, whole grains, and legumes are the foundations of the Pritikin Eating Plan. Recommendations include how to incorporate each of these in flavorful and healthy salads, and how to create homemade salad dressings. Proper handling of ingredients is also covered. Cooking - Soups and [employer] - Soups and Desserts Clinical staff conducted group or individual video education with verbal and written material and guidebook.  Patient learns that Pritikin soups and desserts make for easy, nutritious, and delicious snacks and meal components that are low in sodium, fat, sugar, and calorie density, while high in vitamins, minerals, and filling fiber. Recommendations include simple and healthy ideas for soups and desserts.   Overview     The Pritikin Solution Program Overview Clinical staff conducted group or  individual video education with verbal and written material and guidebook.  Patient learns that the results of the Pritikin Program have been documented in more than 100 articles published in peer-reviewed journals, and the benefits include reducing risk factors for (and, in some cases, even reversing) high cholesterol, high blood pressure, type 2 diabetes, obesity, and more! An overview of the three key pillars of the Pritikin Program will be covered: eating well, doing regular exercise, and having a healthy mind-set.  WORKSHOPS  Exercise: Exercise Basics: Building Your Action Plan Clinical staff led group instruction and group discussion with PowerPoint presentation and patient guidebook. To enhance the learning environment the use of posters, models and videos may be added. At the conclusion of this workshop, patients will comprehend the difference between physical activity and exercise, as well as the benefits of incorporating both, into their routine. Patients will understand the FITT (Frequency, Intensity, Time, and Type) principle and how to use it to build an exercise action plan. In addition, safety concerns and other considerations for exercise and cardiac rehab will be addressed by the presenter. The purpose of this lesson is to promote a comprehensive and effective weekly exercise routine in order to improve patients' overall level of fitness.   Managing Heart Disease: Your Path to a Healthier Heart Clinical staff led group instruction and group discussion with PowerPoint presentation and patient guidebook. To enhance the learning environment the use of posters, models and videos may be added.At the conclusion of this workshop, patients will understand the anatomy and physiology of the heart. Additionally, they will understand how Pritikin's three pillars impact the risk factors, the progression, and the management of heart disease.  The purpose of this lesson is to provide a high-level  overview of the heart, heart disease, and how the Pritikin lifestyle positively impacts risk factors.  Exercise Biomechanics Clinical staff led group instruction and group discussion with PowerPoint presentation and patient guidebook. To enhance the learning environment the use of posters, models and videos may be added. Patients will learn how the structural parts of their bodies function and how these functions impact their daily activities, movement, and exercise. Patients will learn how to promote a neutral spine, learn how to manage pain, and identify ways to improve their physical movement in order to promote healthy living. The purpose of this lesson is to expose patients to common physical limitations that impact physical activity. Participants will learn practical ways to adapt and manage aches and pains, and to minimize their effect on regular exercise. Patients will learn how to maintain good posture while sitting, walking, and lifting.  Balance Training and  Fall Prevention  Clinical staff led group instruction and group discussion with PowerPoint presentation and patient guidebook. To enhance the learning environment the use of posters, models and videos may be added. At the conclusion of this workshop, patients will understand the importance of their sensorimotor skills (vision, proprioception, and the vestibular system) in maintaining their ability to balance as they age. Patients will apply a variety of balancing exercises that are appropriate for their current level of function. Patients will understand the common causes for poor balance, possible solutions to these problems, and ways to modify their physical environment in order to minimize their fall risk. The purpose of this lesson is to teach patients about the importance of maintaining balance as they age and ways to minimize their risk of falling.  WORKSHOPS   Nutrition:  Fueling a Ship broker led group  instruction and group discussion with PowerPoint presentation and patient guidebook. To enhance the learning environment the use of posters, models and videos may be added. Patients will review the foundational principles of the Pritikin Eating Plan and understand what constitutes a serving size in each of the food groups. Patients will also learn Pritikin-friendly foods that are better choices when away from home and review make-ahead meal and snack options. Calorie density will be reviewed and applied to three nutrition priorities: weight maintenance, weight loss, and weight gain. The purpose of this lesson is to reinforce (in a group setting) the key concepts around what patients are recommended to eat and how to apply these guidelines when away from home by planning and selecting Pritikin-friendly options. Patients will understand how calorie density may be adjusted for different weight management goals.  Mindful Eating  Clinical staff led group instruction and group discussion with PowerPoint presentation and patient guidebook. To enhance the learning environment the use of posters, models and videos may be added. Patients will briefly review the concepts of the Pritikin Eating Plan and the importance of low-calorie dense foods. The concept of mindful eating will be introduced as well as the importance of paying attention to internal hunger signals. Triggers for non-hunger eating and techniques for dealing with triggers will be explored. The purpose of this lesson is to provide patients with the opportunity to review the basic principles of the Pritikin Eating Plan, discuss the value of eating mindfully and how to measure internal cues of hunger and fullness using the Hunger Scale. Patients will also discuss reasons for non-hunger eating and learn strategies to use for controlling emotional eating.  Targeting Your Nutrition Priorities Clinical staff led group instruction and group discussion with  PowerPoint presentation and patient guidebook. To enhance the learning environment the use of posters, models and videos may be added. Patients will learn how to determine their genetic susceptibility to disease by reviewing their family history. Patients will gain insight into the importance of diet as part of an overall healthy lifestyle in mitigating the impact of genetics and other environmental insults. The purpose of this lesson is to provide patients with the opportunity to assess their personal nutrition priorities by looking at their family history, their own health history and current risk factors. Patients will also be able to discuss ways of prioritizing and modifying the Pritikin Eating Plan for their highest risk areas  Menu  Clinical staff led group instruction and group discussion with PowerPoint presentation and patient guidebook. To enhance the learning environment the use of posters, models and videos may be added. Using menus brought in from local restaurants,  or printed from [employer], patients will apply the Pritikin dining out guidelines that were presented in the [employer] video. Patients will also be able to practice these guidelines in a variety of provided scenarios. The purpose of this lesson is to provide patients with the opportunity to practice hands-on learning of the Pritikin Dining Out guidelines with actual menus and practice scenarios.  Label Reading Clinical staff led group instruction and group discussion with PowerPoint presentation and patient guidebook. To enhance the learning environment the use of posters, models and videos may be added. Patients will review and discuss the Pritikin label reading guidelines presented in Pritikin's Label Reading Educational series video. Using fool labels brought in from local grocery stores and markets, patients will apply the label reading guidelines and determine if the packaged food meet the Pritikin  guidelines. The purpose of this lesson is to provide patients with the opportunity to review, discuss, and practice hands-on learning of the Pritikin Label Reading guidelines with actual packaged food labels. Cooking School  Pritikin's [employer] are designed to teach patients ways to prepare quick, simple, and affordable recipes at home. The importance of nutrition's role in chronic disease risk reduction is reflected in its emphasis in the overall Pritikin program. By learning how to prepare essential core Pritikin Eating Plan recipes, patients will increase control over what they eat; be able to customize the flavor of foods without the use of added salt, sugar, or fat; and improve the quality of the food they consume. By learning a set of core recipes which are easily assembled, quickly prepared, and affordable, patients are more likely to prepare more healthy foods at home. These workshops focus on convenient breakfasts, simple entres, side dishes, and desserts which can be prepared with minimal effort and are consistent with nutrition recommendations for cardiovascular risk reduction. Cooking [employer] are taught by a Armed forces logistics/support/administrative officer (RD) who has been trained by the [employer]. The chef or RD has a clear understanding of the importance of minimizing - if not completely eliminating - added fat, sugar, and sodium in recipes. Throughout the series of Cooking School Workshop sessions, patients will learn about healthy ingredients and efficient methods of cooking to build confidence in their capability to prepare    Cooking School weekly topics:  Adding Flavor- Sodium-Free  Fast and Healthy Breakfasts  Powerhouse Plant-Based Proteins  Satisfying Salads and Dressings  Simple Sides and Sauces  International Cuisine-Spotlight on the [employer] Zones  Delicious Desserts  Savory Soups  [employer] - Meals in a Astronomer Appetizers and  Snacks  Comforting Weekend Breakfasts  One-Pot Wonders   Fast Evening Meals  Landscape architect Your Pritikin Plate  WORKSHOPS   Healthy Mindset (Psychosocial):  Focused Goals, Sustainable Changes Clinical staff led group instruction and group discussion with PowerPoint presentation and patient guidebook. To enhance the learning environment the use of posters, models and videos may be added. Patients will be able to apply effective goal setting strategies to establish at least one personal goal, and then take consistent, meaningful action toward that goal. They will learn to identify common barriers to achieving personal goals and develop strategies to overcome them. Patients will also gain an understanding of how our mind-set can impact our ability to achieve goals and the importance of cultivating a positive and growth-oriented mind-set. The purpose of this lesson is to provide patients with a deeper understanding of how to set and achieve personal  goals, as well as the tools and strategies needed to overcome common obstacles which may arise along the way.  From Head to Heart: The Power of a Healthy Outlook  Clinical staff led group instruction and group discussion with PowerPoint presentation and patient guidebook. To enhance the learning environment the use of posters, models and videos may be added. Patients will be able to recognize and describe the impact of emotions and mood on physical health. They will discover the importance of self-care and explore self-care practices which may work for them. Patients will also learn how to utilize the 4 C's to cultivate a healthier outlook and better manage stress and challenges. The purpose of this lesson is to demonstrate to patients how a healthy outlook is an essential part of maintaining good health, especially as they continue their cardiac rehab journey.  Healthy Sleep for a Healthy Heart Clinical staff led group instruction and  group discussion with PowerPoint presentation and patient guidebook. To enhance the learning environment the use of posters, models and videos may be added. At the conclusion of this workshop, patients will be able to demonstrate knowledge of the importance of sleep to overall health, well-being, and quality of life. They will understand the symptoms of, and treatments for, common sleep disorders. Patients will also be able to identify daytime and nighttime behaviors which impact sleep, and they will be able to apply these tools to help manage sleep-related challenges. The purpose of this lesson is to provide patients with a general overview of sleep and outline the importance of quality sleep. Patients will learn about a few of the most common sleep disorders. Patients will also be introduced to the concept of "sleep hygiene," and discover ways to self-manage certain sleeping problems through simple daily behavior changes. Finally, the workshop will motivate patients by clarifying the links between quality sleep and their goals of heart-healthy living.   Recognizing and Reducing Stress Clinical staff led group instruction and group discussion with PowerPoint presentation and patient guidebook. To enhance the learning environment the use of posters, models and videos may be added. At the conclusion of this workshop, patients will be able to understand the types of stress reactions, differentiate between acute and chronic stress, and recognize the impact that chronic stress has on their health. They will also be able to apply different coping mechanisms, such as reframing negative self-talk. Patients will have the opportunity to practice a variety of stress management techniques, such as deep abdominal breathing, progressive muscle relaxation, and/or guided imagery.  The purpose of this lesson is to educate patients on the role of stress in their lives and to provide healthy techniques for coping with  it.  Learning Barriers/Preferences:  Learning Barriers/Preferences - 10/12/23 1142       Learning Barriers/Preferences   Learning Barriers None    Learning Preferences Audio;Computer/Internet;Group Instruction;Individual Instruction;Pictoral;Skilled Demonstration;Verbal Instruction;Video;Written Material             Education Topics:  Knowledge Questionnaire Score:  Knowledge Questionnaire Score - 10/12/23 1141       Knowledge Questionnaire Score   Pre Score 19/24             Core Components/Risk Factors/Patient Goals at Admission:  Personal Goals and Risk Factors at Admission - 10/12/23 1142       Core Components/Risk Factors/Patient Goals on Admission   Improve shortness of breath with ADL's Yes    Intervention Provide education, individualized exercise plan and daily activity instruction to help decrease symptoms of  SOB with activities of daily living.    Expected Outcomes Short Term: Improve cardiorespiratory fitness to achieve a reduction of symptoms when performing ADLs;Long Term: Be able to perform more ADLs without symptoms or delay the onset of symptoms    Hypertension Yes    Intervention Provide education on lifestyle modifcations including regular physical activity/exercise, weight management, moderate sodium restriction and increased consumption of fresh fruit, vegetables, and low fat dairy, alcohol moderation, and smoking cessation.;Monitor prescription use compliance.    Expected Outcomes Short Term: Continued assessment and intervention until BP is < 140/29mm HG in hypertensive participants. < 130/71mm HG in hypertensive participants with diabetes, heart failure or chronic kidney disease.;Long Term: Maintenance of blood pressure at goal levels.    Lipids Yes    Intervention Provide education and support for participant on nutrition & aerobic/resistive exercise along with prescribed medications to achieve LDL 70mg , HDL >40mg .    Expected Outcomes Short Term:  Participant states understanding of desired cholesterol values and is compliant with medications prescribed. Participant is following exercise prescription and nutrition guidelines.;Long Term: Cholesterol controlled with medications as prescribed, with individualized exercise RX and with personalized nutrition plan. Value goals: LDL < 70mg , HDL > 40 mg.             Core Components/Risk Factors/Patient Goals Review:   Goals and Risk Factor Review     Row Name 11/08/23 1733             Core Components/Risk Factors/Patient Goals Review   Personal Goals Review Lipids;Hypertension;Improve shortness of breath with ADL's       Review Daniel Colon has been doing well with exercise. Vital signs have been stable       Expected Outcomes Daniel Colon will continue to particpate in cardiac rehab for exercise, nutrtion and lifestyle modfications                Core Components/Risk Factors/Patient Goals at Discharge (Final Review):   Goals and Risk Factor Review - 11/08/23 1733       Core Components/Risk Factors/Patient Goals Review   Personal Goals Review Lipids;Hypertension;Improve shortness of breath with ADL's    Review Daniel Colon has been doing well with exercise. Vital signs have been stable    Expected Outcomes Daniel Colon will continue to particpate in cardiac rehab for exercise, nutrtion and lifestyle modfications             ITP Comments:  ITP Comments     Row Name 10/12/23 0852 11/08/23 1730         ITP Comments Armanda Magic, MD: Medical Director.  Introduction to the [facility] / Intensive Cardiac Rehab. Initial orientation packet reviewed with the patient. 30 Day ITP comments. Daniel Colon has good attendance and participation in cardiac rehab.               Comments: See ITP comments.Thayer Headings RN BSN

## 2023-11-10 ENCOUNTER — Encounter (HOSPITAL_COMMUNITY)
Admission: RE | Admit: 2023-11-10 | Discharge: 2023-11-10 | Disposition: A | Discharge status: Home / Self Care | Payer: Medicare Other | Source: Ambulatory Visit | Attending: Cardiology | Admitting: Cardiology

## 2023-11-10 DIAGNOSIS — Z951 Presence of aortocoronary bypass graft: Secondary | ICD-10-CM

## 2023-11-10 NOTE — Telephone Encounter (Signed)
Telephone call  

## 2023-11-11 ENCOUNTER — Telehealth: Payer: Self-pay | Admitting: *Deleted

## 2023-11-11 NOTE — Telephone Encounter (Signed)
-----   Message from Rollene Rotunda sent at 11/04/2023  7:30 AM EST ----- Regarding: RE: ecg tracing during exercise I can send him a two week Zio and make sure he has follow up after that  Thanks. ----- Message ----- From: Lelon Huh, RN Sent: 11/03/2023   8:36 AM EST To: Rollene Rotunda, MD Subject: ecg tracing during exercise                    Dr. Antoine Poche,  Daniel Colon has been attending cardiac rehab and doing well having completed 9 visits with [place]. Today while exercising he was noted to have a brief change from his usual rhythm. He reported no symptoms and for the remainder of the session had his usual rhythm.  BP 120/56. Reported taking medications as usual. Joni Reining NP onsite provider, reviewed the ecg tracing as an accelerated junctional rhythm and advised that the patient be seen by his Cardiologist. I informed [name] I would notify you for your review, advisement and whether to be seen in office. Please see ecg tracing in EPIC> Chart review>Media> Cardiac Rehab 11/03/23.   We will continue with cardiac rehab exercise as usual unless you otherwise advise differently.  Thank you,  Harriett Sine RN, MHA

## 2023-11-11 NOTE — Telephone Encounter (Signed)
Left message for pt wife to call  

## 2023-11-12 ENCOUNTER — Encounter (HOSPITAL_COMMUNITY)
Admission: RE | Admit: 2023-11-12 | Discharge: 2023-11-12 | Disposition: A | Discharge status: Home / Self Care | Payer: Medicare Other | Source: Ambulatory Visit | Attending: Cardiology | Admitting: Cardiology

## 2023-11-12 DIAGNOSIS — Z951 Presence of aortocoronary bypass graft: Secondary | ICD-10-CM

## 2023-11-15 ENCOUNTER — Telehealth: Payer: Self-pay | Admitting: Cardiology

## 2023-11-15 ENCOUNTER — Encounter (HOSPITAL_COMMUNITY)
Admission: RE | Admit: 2023-11-15 | Discharge: 2023-11-15 | Disposition: A | Discharge status: Home / Self Care | Payer: Medicare Other | Source: Ambulatory Visit | Attending: Cardiology | Admitting: Cardiology

## 2023-11-15 DIAGNOSIS — Z951 Presence of aortocoronary bypass graft: Secondary | ICD-10-CM | POA: Diagnosis not present

## 2023-11-15 NOTE — Telephone Encounter (Signed)
   Pre-operative Risk Assessment    Patient Name: Daniel Colon  DOB: 10-23-1946 MRN: [id number]      Request for Surgical Clearance    Procedure:   LAPAROSCOPIC RIGHT INGUINAL HERNIA REPAIR WITH MESH  Date of Surgery:  Clearance 11/24/23                                 Surgeon:  Dr. Henreitta Cea Group or Practice Name:  [facility]  Phone number:  [phone] Fax number:  [phone]   Type of Clearance Requested:   - Medical  - Pharmacy:  Hold Aspirin 5 days prior    Type of Anesthesia:  General    Additional requests/questions:    Alben Spittle   11/15/2023, 4:28 PM

## 2023-11-15 NOTE — Pre-Procedure Instructions (Signed)
Surgical Instructions   Your procedure is scheduled on November 24, 2023. Report to [facility] Main Entrance "A" at 10:00 A.M., then check in with the Admitting office. Any questions or running late day of surgery: call [phone]  Questions prior to your surgery date: call [phone], Monday-Friday, 8am-4pm. If you experience any cold or flu symptoms such as cough, fever, chills, shortness of breath, etc. between now and your scheduled surgery, please notify us at the above number.     Remember:  Do not eat after midnight the night before your surgery   You may drink clear liquids until 9:00 AM the morning of your surgery.   Clear liquids allowed are: Water, Non-Citrus Juices (without pulp), Carbonated Beverages, Clear Tea (no milk, honey, etc.), Black Coffee Only (NO MILK, CREAM OR POWDERED CREAMER of any kind), and Gatorade.  Patient Instructions  The night before surgery:  No food after midnight. ONLY clear liquids after midnight  The day of surgery (if you do NOT have diabetes):  Drink ONE (1) Pre-Surgery Clear Ensure by 9:00 AM the morning of surgery. Drink in one sitting. Do not sip.  This drink was given to you during your hospital  pre-op appointment visit.  Nothing else to drink after completing the  Pre-Surgery Clear Ensure.         If you have questions, please contact your surgeon's office.    Take these medicines the morning of surgery with A SIP OF WATER: BREZTRI AEROSPHERE inhaler metoprolol tartrate (LOPRESSOR)    May take these medicines IF NEEDED: acetaminophen (TYLENOL)  albuterol (VENTOLIN HFA) inhaler - please bring inhaler with you morning of surgery docusate sodium (COLACE)  nitroGLYCERIN (NITROSTAT) - please call either of the above numbers if dose taken prior to surgery.   Follow your surgeon's instructions on when to stop Asprin.  If no instructions were given by your surgeon then you will need to call the office to get those instructions.      One week prior to surgery, STOP taking any Aleve, Naproxen, Ibuprofen, Motrin, Advil, Goody's, BC's, all herbal medications, fish oil, and non-prescription vitamins.                     Do NOT Smoke (Tobacco/Vaping) for 24 hours prior to your procedure.  If you use a CPAP at night, you may bring your mask/headgear for your overnight stay.   You will be asked to remove any contacts, glasses, piercing's, hearing aid's, dentures/partials prior to surgery. Please bring cases for these items if needed.    Patients discharged the day of surgery will not be allowed to drive home, and someone needs to stay with them for 24 hours.  SURGICAL WAITING ROOM VISITATION Patients may have no more than 2 support people in the waiting area - these visitors may rotate.   Pre-op nurse will coordinate an appropriate time for 1 ADULT support person, who may not rotate, to accompany patient in pre-op.  Children under the age of 12 must have an adult with them who is not the patient and must remain in the main waiting area with an adult.  If the patient needs to stay at the hospital during part of their recovery, the visitor guidelines for inpatient rooms apply.  Please refer to the [facility] website for the visitor guidelines for any additional information.   If you received a COVID test during your pre-op visit  it is requested that you wear a mask when out in  public, stay away from anyone that may not be feeling well and notify your surgeon if you develop symptoms. If you have been in contact with anyone that has tested positive in the last 10 days please notify you surgeon.      Pre-operative CHG Bathing Instructions   You can play a key role in reducing the risk of infection after surgery. Your skin needs to be as free of germs as possible. You can reduce the number of germs on your skin by washing with CHG (chlorhexidine gluconate) soap before surgery. CHG is an antiseptic soap that kills germs and  continues to kill germs even after washing.   DO NOT use if you have an allergy to chlorhexidine/CHG or antibacterial soaps. If your skin becomes reddened or irritated, stop using the CHG and notify one of our RNs at [phone].              TAKE A SHOWER THE NIGHT BEFORE SURGERY AND THE DAY OF SURGERY    Please keep in mind the following:  DO NOT shave, including legs and underarms, 48 hours prior to surgery.   You may shave your face before/day of surgery.  Place clean sheets on your bed the night before surgery Use a clean washcloth (not used since being washed) for each shower. DO NOT sleep with pet's night before surgery.  CHG Shower Instructions:  Wash your face and private area with normal soap. If you choose to wash your hair, wash first with your normal shampoo.  After you use shampoo/soap, rinse your hair and body thoroughly to remove shampoo/soap residue.  Turn the water OFF and apply half the bottle of CHG soap to a CLEAN washcloth.  Apply CHG soap ONLY FROM YOUR NECK DOWN TO YOUR TOES (washing for 3-5 minutes)  DO NOT use CHG soap on face, private areas, open wounds, or sores.  Pay special attention to the area where your surgery is being performed.  If you are having back surgery, having someone wash your back for you may be helpful. Wait 2 minutes after CHG soap is applied, then you may rinse off the CHG soap.  Pat dry with a clean towel  Put on clean pajamas    Additional instructions for the day of surgery: DO NOT APPLY any lotions, deodorants, cologne, or perfumes.   Do not wear jewelry or makeup Do not wear nail polish, gel polish, artificial nails, or any other type of covering on natural nails (fingers and toes) Do not bring valuables to the hospital. [facility] is not responsible for valuables/personal belongings. Put on clean/comfortable clothes.  Please brush your teeth.  Ask your nurse before applying any prescription medications to the skin.

## 2023-11-16 ENCOUNTER — Other Ambulatory Visit: Payer: Self-pay

## 2023-11-16 ENCOUNTER — Encounter: Payer: Self-pay | Admitting: Internal Medicine

## 2023-11-16 ENCOUNTER — Telehealth: Payer: Self-pay | Admitting: *Deleted

## 2023-11-16 ENCOUNTER — Encounter (HOSPITAL_COMMUNITY)
Admission: RE | Admit: 2023-11-16 | Discharge: 2023-11-16 | Disposition: A | Discharge status: Home / Self Care | Payer: Medicare Other | Source: Ambulatory Visit | Attending: General Surgery | Admitting: General Surgery

## 2023-11-16 ENCOUNTER — Other Ambulatory Visit: Payer: Self-pay | Admitting: Internal Medicine

## 2023-11-16 ENCOUNTER — Encounter (HOSPITAL_COMMUNITY): Payer: Self-pay

## 2023-11-16 ENCOUNTER — Ambulatory Visit (INDEPENDENT_AMBULATORY_CARE_PROVIDER_SITE_OTHER): Payer: Medicare Other | Admitting: Internal Medicine

## 2023-11-16 ENCOUNTER — Ambulatory Visit: Payer: Self-pay | Admitting: General Surgery

## 2023-11-16 VITALS — BP 144/51 | HR 60 | Temp 98.3°F | Resp 18 | Ht 67.5 in | Wt 171.5 lb

## 2023-11-16 VITALS — BP 124/70 | HR 55 | Ht 67.75 in | Wt 171.0 lb

## 2023-11-16 DIAGNOSIS — I700 Atherosclerosis of aorta: Secondary | ICD-10-CM | POA: Diagnosis not present

## 2023-11-16 DIAGNOSIS — K4090 Unilateral inguinal hernia, without obstruction or gangrene, not specified as recurrent: Secondary | ICD-10-CM | POA: Diagnosis not present

## 2023-11-16 DIAGNOSIS — Q2112 Patent foramen ovale: Secondary | ICD-10-CM | POA: Diagnosis not present

## 2023-11-16 DIAGNOSIS — I252 Old myocardial infarction: Secondary | ICD-10-CM | POA: Insufficient documentation

## 2023-11-16 DIAGNOSIS — Z860101 Personal history of adenomatous and serrated colon polyps: Secondary | ICD-10-CM | POA: Diagnosis not present

## 2023-11-16 DIAGNOSIS — Z955 Presence of coronary angioplasty implant and graft: Secondary | ICD-10-CM | POA: Diagnosis not present

## 2023-11-16 DIAGNOSIS — I2510 Atherosclerotic heart disease of native coronary artery without angina pectoris: Secondary | ICD-10-CM | POA: Insufficient documentation

## 2023-11-16 DIAGNOSIS — Z951 Presence of aortocoronary bypass graft: Secondary | ICD-10-CM | POA: Diagnosis not present

## 2023-11-16 DIAGNOSIS — K2270 Barrett's esophagus without dysplasia: Secondary | ICD-10-CM

## 2023-11-16 DIAGNOSIS — E785 Hyperlipidemia, unspecified: Secondary | ICD-10-CM | POA: Diagnosis not present

## 2023-11-16 DIAGNOSIS — K219 Gastro-esophageal reflux disease without esophagitis: Secondary | ICD-10-CM | POA: Diagnosis not present

## 2023-11-16 DIAGNOSIS — Z01818 Encounter for other preprocedural examination: Secondary | ICD-10-CM | POA: Diagnosis not present

## 2023-11-16 DIAGNOSIS — I083 Combined rheumatic disorders of mitral, aortic and tricuspid valves: Secondary | ICD-10-CM | POA: Diagnosis not present

## 2023-11-16 DIAGNOSIS — K5909 Other constipation: Secondary | ICD-10-CM | POA: Diagnosis not present

## 2023-11-16 LAB — BASIC METABOLIC PANEL
Anion gap: 12 (ref 5–15)
BUN: 19 mg/dL (ref 8–23)
CO2: 24 mmol/L (ref 22–32)
Calcium: 9 mg/dL (ref 8.9–10.3)
Chloride: 105 mmol/L (ref 98–111)
Creatinine, Ser: 1.19 mg/dL (ref 0.61–1.24)
GFR, Estimated: >60 mL/min (ref >60)
Glucose, Bld: 93 mg/dL (ref 70–99)
Potassium: 4.1 mmol/L (ref 3.5–5.1)
Sodium: 141 mmol/L (ref 135–145)

## 2023-11-16 LAB — CBC
HCT: 40.7 % (ref 39.0–52.0)
Hemoglobin: 13.1 g/dL (ref 13.0–17.0)
MCH: 26.7 pg (ref 26.0–34.0)
MCHC: 32.2 g/dL (ref 30.0–36.0)
MCV: 82.9 fL (ref 80.0–100.0)
Platelets: 196 10*3/uL (ref 150–400)
RBC: 4.91 MIL/uL (ref 4.22–5.81)
RDW: 17 % — ABNORMAL HIGH (ref 11.5–15.5)
WBC: 8 10*3/uL (ref 4.0–10.5)
nRBC: 0 % (ref 0.0–0.2)

## 2023-11-16 MED ORDER — PLENVU 140 G PO SOLR: 1.0000 | ORAL | 0 refills | Status: DC

## 2023-11-16 MED ORDER — LINACLOTIDE 290 MCG PO CAPS: 290.0000 ug | ORAL_CAPSULE | Freq: Every day | ORAL | 0 refills | Status: DC

## 2023-11-16 NOTE — Telephone Encounter (Signed)
I will send an FYI to the surgeon office pt will need tele appt and we are trying to add on today 2 pm.

## 2023-11-16 NOTE — Telephone Encounter (Signed)
S/w the pt and his wife and the pt cannot do the tele appt today as he will be doing his PAT 2 pm today. Pt has been added on tomorrow, ok per Jari Favre, PAC 1:20. Med rec and consent are done.

## 2023-11-16 NOTE — Telephone Encounter (Signed)
Patient's wife is returning call.

## 2023-11-16 NOTE — Telephone Encounter (Signed)
   Name: Daniel Colon  DOB: Mar 03, 1946  MRN: [id number]  Primary Cardiologist: Rollene Rotunda, MD  Chart reviewed as part of pre-operative protocol coverage. Because of [name] past medical history and time since last visit, he will require a follow-up telephone visit in order to better assess preoperative cardiovascular risk.  Pre-op covering staff: - Please schedule appointment and call patient to inform them. If patient already had an upcoming appointment within acceptable timeframe, please add "pre-op clearance" to the appointment notes so provider is aware. - Please contact requesting surgeon's office via preferred method (i.e, phone, fax) to inform them of need for appointment prior to surgery.  Patient is on ASA for remote RCA stenting.  Can hold ASA x 5 days prior to surgery and resume when medically safe to do so.  Sharlene Dory, PA-C  11/16/2023, 8:26 AM

## 2023-11-16 NOTE — Telephone Encounter (Signed)
Left another message for the pt that we can add him on today at 2 pm. To please call the office back ASAP, so the we can get the tele appt scheduled.

## 2023-11-16 NOTE — Telephone Encounter (Signed)
S/w the pt and his wife and the pt cannot do the tele appt today as he will be doing his PAT 2 pm today. Pt has been added on tomorrow, ok per Jari Favre, PAC 1:20. Med rec and consent are done.    Patient Consent for Virtual Visit        Daniel Colon has provided verbal consent on 11/16/2023 for a virtual visit (video or telephone).   CONSENT FOR VIRTUAL VISIT FOR:  Daniel Colon  By participating in this virtual visit I agree to the following:  I hereby voluntarily request, consent and authorize [facility] and its employed or contracted physicians, physician assistants, nurse practitioners or other licensed health care professionals (the Practitioner), to provide me with telemedicine health care services (the "Services") as deemed necessary by the treating Practitioner. I acknowledge and consent to receive the Services by the Practitioner via telemedicine. I understand that the telemedicine visit will involve communicating with the Practitioner through live audiovisual communication technology and the disclosure of certain medical information by electronic transmission. I acknowledge that I have been given the opportunity to request an in-person assessment or other available alternative prior to the telemedicine visit and am voluntarily participating in the telemedicine visit.  I understand that I have the right to withhold or withdraw my consent to the use of telemedicine in the course of my care at any time, without affecting my right to future care or treatment, and that the Practitioner or I may terminate the telemedicine visit at any time. I understand that I have the right to inspect all information obtained and/or recorded in the course of the telemedicine visit and may receive copies of available information for a reasonable fee.  I understand that some of the potential risks of receiving the Services via telemedicine include:  Delay or interruption in medical evaluation  due to technological equipment failure or disruption; Information transmitted may not be sufficient (e.g. poor resolution of images) to allow for appropriate medical decision making by the Practitioner; and/or  In rare instances, security protocols could fail, causing a breach of personal health information.  Furthermore, I acknowledge that it is my responsibility to provide information about my medical history, conditions and care that is complete and accurate to the best of my ability. I acknowledge that Practitioner's advice, recommendations, and/or decision may be based on factors not within their control, such as incomplete or inaccurate data provided by me or distortions of diagnostic images or specimens that may result from electronic transmissions. I understand that the practice of medicine is not an exact science and that Practitioner makes no warranties or guarantees regarding treatment outcomes. I acknowledge that a copy of this consent can be made available to me via my patient portal [facility] MyChart), or I can request a printed copy by calling the office of [facility].    I understand that my insurance will be billed for this visit.   I have read or had this consent read to me. I understand the contents of this consent, which adequately explains the benefits and risks of the Services being provided via telemedicine.  I have been provided ample opportunity to ask questions regarding this consent and the Services and have had my questions answered to my satisfaction. I give my informed consent for the services to be provided through the use of telemedicine in my medical care

## 2023-11-16 NOTE — Telephone Encounter (Signed)
Left message to call back to schedule a tele pre op appt. Per Jari Favre, pre op APP to add on tomorrow.

## 2023-11-16 NOTE — Progress Notes (Signed)
Subjective:    Patient ID: Daniel Colon, male    DOB: 28-Jun-1946, 77 y.o.   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  He is here today with his wife.  He was last seen on 06/15/2023 by Doug Sou, PA-C.  A CT scan was done after this visit.  See below.  He reports that he is feeling well but still has issues with constipation.  He uses an alternating over-the-counter drug regimen to maintain near daily bowel movement.  This involves a combination of psyllium, MiraLAX, prune juice, senna and/or Dulcolax.  He has to change therapies because he states 100% consistent he seems to get constipated.  With these rotating therapies he is normally able to achieve a bowel movement daily.  When he is constipated he has right-sided abdominal discomfort.  He states this dates back almost exactly to the February 2021 when he had his lobectomy for his lung cancer.  He is taking omeprazole 10 mg daily.  He has no dysphagia or heartburn.  No nausea or vomiting.  Good appetite.  Review of Systems As per HPI, otherwise negative  Current Medications, Allergies, Past Medical History, Past Surgical History, Family History and Social History were reviewed in [employer] record.    Objective:   Physical Exam BP 124/70   Pulse (!) 55   Ht 5' 7.75" (1.721 m)   Wt 171 lb (77.6 kg)   BMI 26.19 kg/m  Gen: awake, alert, NAD HEENT: anicteric  CV: RRR, no mrg Pulm: Distant breath sounds without wheezing or rails today Abd: soft, NT/ND, +BS throughout Ext: no c/c/e Neuro: nonfocal CT ABDOMEN AND PELVIS WITH CONTRAST   TECHNIQUE: Multidetector CT imaging of the abdomen and pelvis was performed using the standard protocol following bolus  administration of intravenous contrast.   RADIATION DOSE REDUCTION: This exam was performed according to the departmental dose-optimization program which includes automated exposure control, adjustment of the mA and/or kV according to patient size and/or use of iterative reconstruction technique.   CONTRAST:  OMNIPAQUE IOHEXOL 300 MG/ML  SOLN   COMPARISON:  PET-CT dated 01/05/2020   FINDINGS: Lower chest: Trace bilateral pleural effusions.   Hepatobiliary: Subcentimeter central hepatic cyst (series 2/image 13).   Gallbladder is unremarkable. No intrahepatic or extrahepatic ductal dilatation.   Pancreas: Within normal limits.   Spleen: Within normal limits.   Adrenals/Urinary Tract: 2.2 cm right adrenal nodule (series 2/image 24), previously characterized as a benign adrenal adenoma on PET. Left adrenal gland is within normal limits.   Two nonobstructing right lower pole renal calculi measuring up to 9 mm (series 2/image 42). Two right lower pole renal cysts measure up to 12 mm (series 7/image 9) measuring simple fluid density, benign (Bosniak I). No follow-up is recommended.   Left kidney is within normal limits.  No hydronephrosis.   Bladder is within normal limits.   Stomach/Bowel: Stomach is notable for a tiny hiatal hernia.   Postsurgical changes related to prior gastrojejunostomy.   Mild fluid in the right upper, and along the right anterior pararenal space (series 2/image 35), favored to be associated with the second portion of the duodenum. As such, duodenitis is possible.   No evidence of bowel obstruction.   Normal  appendix (series 2/image 51).   No colonic wall thickening or inflammatory changes. Normal colonic stool burden.   Vascular/Lymphatic: No evidence of abdominal aortic aneurysm.   Atherosclerotic calcifications of the abdominal aorta and branch vessels.   No suspicious abdominopelvic lymphadenopathy.   Reproductive: Suspected  postsurgical changes related to prior TURP.   Other: No abdominopelvic ascites.   Small fat containing right inguinal hernia.   Musculoskeletal: Degenerative changes of the visualized thoracolumbar spine. Median sternotomy.   IMPRESSION: Mild fluid in the right upper quadrant, raising the possibility of duodenitis.   Nonobstructing right lower pole renal calculi measuring up to 9 mm. No hydronephrosis.   Trace bilateral pleural effusions.   Additional ancillary findings as above.     Electronically Signed   By: Charline Bills M.D.   On: 07/02/2023 22:44      Latest Ref Rng & Units 09/06/2023    7:35 AM 08/10/2023    3:35 AM 08/03/2023    2:42 PM  CBC  WBC 4.0 - 10.5 K/uL 6.5  14.6  7.4   Hemoglobin 13.0 - 17.0 g/dL 16.1  09.6  04.5   Hematocrit 39.0 - 52.0 % 36.0  33.0  38.4   Platelets 150 - 400 K/uL 176  164  180    CMP     Component Value Date/Time   NA 141 09/06/2023 0735   NA 144 07/01/2023 0830   K 4.1 09/06/2023 0735   CL 104 09/06/2023 0735   CO2 31 09/06/2023 0735   GLUCOSE 102 (H) 09/06/2023 0735   BUN 24 (H) 09/06/2023 0735   BUN 18 07/01/2023 0830   CREATININE 1.13 09/06/2023 0735   CREATININE 0.98 05/18/2023 1240   CALCIUM 9.0 09/06/2023 0735   PROT 6.3 (L) 09/06/2023 0735   ALBUMIN 4.0 09/06/2023 0735   AST 19 09/06/2023 0735   ALT 21 09/06/2023 0735   ALKPHOS 85 09/06/2023 0735   BILITOT 0.5 09/06/2023 0735   GFR 93.52 11/13/2013 1253   EGFR 79 07/01/2023 0830   GFRNONAA >60 09/06/2023 0735        Assessment & Plan:  77 year old male with a history of adenomatous colon polyps, constipation, GERD with Barrett's esophagus, COPD not on oxygen, lung cancer status post lobectomy, diastolic heart failure and 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   Chronic constipation --he has tried multiple different over-the-counter laxatives with inconsistent results.  We discussed trying a prescription laxative which may need  titration to arrive at the right dose -- Begin Linzess 290 mcg daily; asked that he notify the MyChart message if he is having any issues with this medication or if we need to try a different dose or a different therapy altogether  2.  History of adenomatous colon polyps --he had 3 adenomas in December 2020.  Given that we are pursuing upper endoscopy we will update colonoscopy on the same day.  We discussed the risk, benefits and alternatives and he is agreeable and wishes to proceed -- Colonoscopy in February  3.  Barrett's without dysplasia/GERD --on low-dose PPI.  Surveillance endoscopy recommended at this time.  Last EGD was December 2020.  We discussed the risk, benefits and alternatives and he is agreeable and wishes to proceed -- Upper endoscopy in February -- Continue omeprazole at current dose daily  4.  Right inguinal hernia --scheduled for laparoscopic repair on 11/24/2023  5.  COPD/history of lung cancer --following with Dr. Isaiah Serge  30 minutes total  spent today including patient facing time, coordination of care, reviewing medical history/procedures/pertinent radiology studies, and documentation of the encounter.

## 2023-11-16 NOTE — Patient Instructions (Addendum)
We have sent the following medications to your pharmacy for you to pick up at your convenience: Plenvu , Linzess   Start Linzess 290 mcg - once daily  Continue Omeprazole as directed.   You have been scheduled for an endoscopy and colonoscopy. Please follow the written instructions given to you at your visit today.  Please pick up your prep supplies at the pharmacy within the next 1-3 days.  If you use inhalers (even only as needed), please bring them with you on the day of your procedure.  DO NOT TAKE 7 DAYS PRIOR TO TEST- Trulicity (dulaglutide) Ozempic, Wegovy (semaglutide) Mounjaro (tirzepatide) Bydureon Bcise (exanatide extended release)  DO NOT TAKE 1 DAY PRIOR TO YOUR TEST Rybelsus (semaglutide) Adlyxin (lixisenatide) Victoza (liraglutide) Byetta (exanatide) ___________________________________________________________________________  Due to recent changes in healthcare laws, you may see the results of your imaging and laboratory studies on MyChart before your provider has had a chance to review them.  We understand that in some cases there may be results that are confusing or concerning to you. Not all laboratory results come back in the same time frame and the provider may be waiting for multiple results in order to interpret others.  Please give [place] 48 hours in order for your provider to thoroughly review all the results before contacting the office for clarification of your results.   Thank you for choosing me and [facility].  Dr. Vonna Kotyk Pyrtle

## 2023-11-16 NOTE — Progress Notes (Signed)
PCP - Dr. Jarome Matin Cardiologist - Dr. Rollene Rotunda - last office visit 09/08/2023  PPM/ICD - Denies Device Orders - n/a Rep Notified - n/a  Chest x-ray - 05/25/2023 EKG - 08/03/2023 Stress Test - 04/20/2023 ECHO - 03/25/2023 Cardiac Cath - 04/22/2023  Sleep Study - Denies CPAP - n/a  No DM  Last dose of GLP1 agonist- n/a GLP1 instructions: n/a  Blood Thinner Instructions: n/a Aspirin Instructions: Pt instructed to receive ASA instructions from surgeon/cardiologist  ERAS Protcol - Clear liquids until 0900 morning of surgery PRE-SURGERY Ensure or G2- Ensure given to pt with instructions  COVID TEST- n/a   Anesthesia review: Yes. Pt had cardiac clearance appointment tomorrow, 11/27. Hx of CABG May 2024, MI with stent in 1996, Lobectomy in 2021.    Patient denies shortness of breath, fever, cough and chest pain at PAT appointment. Pt denies any respiratory illness/infection in the last two months.    All instructions explained to the patient, with a verbal understanding of the material. Patient agrees to go over the instructions while at home for a better understanding. Patient also instructed to self quarantine after being tested for COVID-19. The opportunity to ask questions was provided.

## 2023-11-17 ENCOUNTER — Telehealth: Payer: Self-pay | Admitting: Pharmacy Technician

## 2023-11-17 ENCOUNTER — Encounter (HOSPITAL_COMMUNITY)
Admission: RE | Admit: 2023-11-17 | Discharge: 2023-11-17 | Disposition: A | Discharge status: Home / Self Care | Payer: Medicare Other | Source: Ambulatory Visit | Attending: Cardiology | Admitting: Cardiology

## 2023-11-17 ENCOUNTER — Ambulatory Visit: Payer: Medicare Other | Attending: Cardiology

## 2023-11-17 ENCOUNTER — Other Ambulatory Visit (HOSPITAL_COMMUNITY): Payer: Self-pay

## 2023-11-17 DIAGNOSIS — Z951 Presence of aortocoronary bypass graft: Secondary | ICD-10-CM

## 2023-11-17 DIAGNOSIS — Z01810 Encounter for preprocedural cardiovascular examination: Secondary | ICD-10-CM

## 2023-11-17 NOTE — Anesthesia Preprocedure Evaluation (Addendum)
Anesthesia Evaluation  Patient identified by MRN, date of birth, ID band Patient awake    Reviewed: Allergy & Precautions, NPO status , Patient's Chart, lab work & pertinent test results  Airway Mallampati: II  TM Distance: >3 FB Neck ROM: Full    Dental  (+) Teeth Intact, Dental Advisory Given   Pulmonary COPD,  COPD inhaler, former smoker    + decreased breath sounds      Cardiovascular hypertension, + CAD, + Past MI, + Cardiac Stents and + CABG   Rhythm:Regular Rate:Normal  Echo: Left Ventricle: The left ventricle has normal systolic function, with an  ejection fraction of 55-60%. The cavity size was normal. No evidence of  left ventricular regional wall motion abnormalities. There is moderate  left ventricular hypertrophy.     Neuro/Psych negative neurological ROS  negative psych ROS   GI/Hepatic Neg liver ROS, PUD,GERD  ,,  Endo/Other  negative endocrine ROS    Renal/GU negative Renal ROS     Musculoskeletal  (+) Arthritis , Osteoarthritis,    Abdominal   Peds  Hematology negative hematology ROS (+)   Anesthesia Other Findings   Reproductive/Obstetrics                             Anesthesia Physical Anesthesia Plan  ASA: 3  Anesthesia Plan: General   Post-op Pain Management: Tylenol PO (pre-op)*   Induction: Intravenous  PONV Risk Score and Plan: 2 and Ondansetron, Treatment may vary due to age or medical condition and Midazolam  Airway Management Planned: Oral ETT  Additional Equipment: None  Intra-op Plan:   Post-operative Plan: Extubation in OR  Informed Consent: I have reviewed the patients History and Physical, chart, labs and discussed the procedure including the risks, benefits and alternatives for the proposed anesthesia with the patient or authorized representative who has indicated his/her understanding and acceptance.       Plan Discussed with:  CRNA  Anesthesia Plan Comments: (PAT note written 11/17/2023 by Shonna Chock, PA-C.  )        Anesthesia Quick Evaluation

## 2023-11-17 NOTE — Telephone Encounter (Signed)
PA has been submitted, and telephone encounter has been created.

## 2023-11-17 NOTE — Telephone Encounter (Signed)
PA has been submitted, and telephone encounter has been created. 

## 2023-11-17 NOTE — Telephone Encounter (Signed)
Pharmacy Patient Advocate Encounter   Received notification from RX Request Messages that prior authorization for LINZESS is required/requested.   Insurance verification completed.   The patient is insured through [employer] .   Per test claim: PA required; PA submitted to above mentioned insurance via CoverMyMeds Key/confirmation #/EOC [id number] Status is pending

## 2023-11-17 NOTE — Progress Notes (Addendum)
Virtual Visit via Telephone Note   Because of Daniel Colon co-morbid illnesses, he is at least at moderate risk for complications without adequate follow up.  This format is felt to be most appropriate for this patient at this time.  The patient did not have access to video technology/had technical difficulties with video requiring transitioning to audio format only (telephone).  All issues noted in this document were discussed and addressed.  No physical exam could be performed with this format.  Please refer to the patient's chart for his consent to telehealth for [facility].  Evaluation Performed:  Preoperative cardiovascular risk assessment _____________   Date:  11/17/2023   Patient ID:  Daniel Colon, DOB 14-Aug-1946, MRN [id number] Patient Location:  Home Provider location:   Office  Primary Care Provider:  Garlan Fillers, MD Primary Cardiologist:  Rollene Rotunda, MD  Chief Complaint / Patient Profile   77 y.o. y/o male with a h/o diastolic CHF, hyperlipidemia, hypertension, coronary artery disease who is pending  LAPAROSCOPIC RIGHT INGUINAL HERNIA REPAIR WITH MESH  and presents today for telephonic preoperative cardiovascular risk assessment.  History of Present Illness    Daniel Colon is a 77 y.o. male who presents via audio/video conferencing for a telehealth visit today.  Pt was last seen in cardiology clinic on 09/08/2023 by Dr. Antoine Poche.  At that time Dencil Fetterolf was doing well .  The patient is now pending procedure as outlined above. Since his last visit, he remains stable from a cardiac standpoint.  Today he denies chest pain, shortness of breath, lower extremity edema, fatigue, palpitations, melena, hematuria, hemoptysis, diaphoresis, weakness, presyncope, syncope, orthopnea, and PND.   Past Medical History    Past Medical History:  Diagnosis Date   Allergy    Arthritis    Barrett's esophagus    CAD (coronary artery disease)    a.  Stent to the RCA 1996 (in [place]);  b. ETT(11/13/13): abnormal with early ST changes to suggest ischemia;   c. LHC (11/17/13):  pLAD 30-40, D1 30, mLAD 70-75, mCFX 70, mRCA stent 40 (ISR), EF 55-65%.  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 art   BILROTH II PROCEDURE     Related to gastric ulcer   CARDIAC CATHETERIZATION     CIRCUMCISION     COLONOSCOPY     CORONARY ARTERY BYPASS GRAFT N/A 04/27/2023   Procedure: CORONARY ARTERY BYPASS GRAFTING (CABG) X 4, USING LEFT INTERNAL MAMMARY ARTERY AND RIGHT LEG GREATER SAPHENOUS VEIN HARVESTED ENDOSCOPICALLY;  Surgeon: Corliss Skains, MD;  [facility];  Service: Open Heart Surgery;  Laterality: N/A;   CORONARY PRESSURE/FFR STUDY N/A 04/22/2023   Procedure: CORONARY PRESSURE/FFR STUDY;  Surgeon: Kathleene Hazel, MD;  [facility] INV[facility];  Service: Cardiovascular;  Laterality: N/A;   INTERCOSTAL NERVE BLOCK Right 01/24/2020  Procedure: Intercostal Nerve Block;  Surgeon: Delight Ovens, MD;  Location: [facility] OR;  Service: Thoracic;  Laterality: Right;   KNEE ARTHROSCOPY Left    LEFT HEART CATH AND CORONARY ANGIOGRAPHY N/A 04/22/2023   Procedure: LEFT HEART CATH AND CORONARY ANGIOGRAPHY;  Surgeon: Kathleene Hazel, MD;  [facility];  Service: Cardiovascular;  Laterality: N/A;   LEFT HEART CATHETERIZATION WITH CORONARY ANGIOGRAM N/A 11/17/2013   Procedure: LEFT HEART CATHETERIZATION WITH CORONARY ANGIOGRAM;  Surgeon:  Micheline Chapman, MD;  Location: [facility] CATH LAB;  Service: Cardiovascular;  Laterality: N/A;   LUNG LOBECTOMY  01/2020   had chemo, no radtion   LYMPH NODE DISSECTION N/A 01/24/2020   Procedure: LYMPH NODE DISSECTION;  Surgeon: Delight Ovens, MD;  Location: [facility] OR;  Service: Thoracic;  Laterality: N/A;   NECK LESION BIOPSY     POLYPECTOMY     REFRACTIVE SURGERY     SKIN BIOPSY     Back   TEE WITHOUT CARDIOVERSION N/A 04/27/2023   Procedure: TRANSESOPHAGEAL ECHOCARDIOGRAM;  Surgeon: Corliss Skains, MD;  [facility];  Service: Open Heart Surgery;  Laterality: N/A;   Thigh surgery Left    Infant   TOTAL HIP ARTHROPLASTY Right 08/09/2023   Procedure: TOTAL HIP ARTHROPLASTY ANTERIOR APPROACH;  Surgeon: Ollen Gross, MD;  [facility]: [facility] ORS;  Service: Orthopedics;  Laterality: Right;   ulcer surgery     ULNAR NERVE TRANSPOSITION Right 08/05/2017   Procedure: RIGHT ULNAR NERVE DECOMPRESSION;  Surgeon: Betha Loa, MD;  Location: [facility];  Service: Orthopedics;  Laterality: Right;   UPPER GI ENDOSCOPY     VIDEO BRONCHOSCOPY N/A 01/24/2020   Procedure: VIDEO BRONCHOSCOPY;  Surgeon: Delight Ovens, MD;  Location: [facility] OR;  Service: Thoracic;  Laterality: N/A;   WISDOM TOOTH EXTRACTION      Allergies  No Known Allergies  Home Medications    Prior to Admission medications   Medication Sig Start Date End Date Taking? Authorizing Provider  acetaminophen (TYLENOL) 650 MG CR tablet Take 1,300 mg by mouth every 8 (eight) hours as needed for pain (Mild pain).    [provider]  albuterol (VENTOLIN HFA) 108 (90 Base) MCG/ACT inhaler Inhale 2 puffs into the lungs daily as needed for wheezing or shortness of breath (Depend on activity level). 11/19/20   [provider]  ascorbic acid (VITAMIN C) 500 MG tablet Take 500 mg by mouth daily.    [provider]  aspirin EC 81 MG tablet Take 1 tablet (81 mg total) by mouth daily. Swallow whole.  10/14/23   Rollene Rotunda, MD  atorvastatin (LIPITOR) 40 MG tablet Take 1 tablet (40 mg total) by mouth at bedtime. 05/05/23   Rollene Rotunda, MD  bisacodyl (DULCOLAX) 5 MG EC tablet Take 5 mg by mouth daily as needed for moderate constipation.    [provider]  BREZTRI AEROSPHERE 160-9-4.8 MCG/ACT AERO Inhale 2 puffs into the lungs in the morning and at bedtime. 06/25/20   [provider]  Coenzyme Q10 (COQ10 PO) Take by mouth.    [provider]  docusate sodium (COLACE) 100 MG capsule Take 100 mg by mouth daily as needed for mild constipation.    [provider]  ezetimibe (ZETIA) 10 MG tablet Take 10 mg by mouth at bedtime.    [provider]  Fish Oil-Cholecalciferol (FISH OIL + D3 PO) Take by mouth.    [provider]  fluorouracil (EFUDEX) 5 % cream  Apply 1 Application topically daily as needed (Itch). 11/04/22   [provider]  GNP IRON 200 (65 Fe) MG TABS Take 200 mg by mouth at bedtime. 04/27/22   [provider]  linaclotide Karlene Einstein) 290 MCG CAPS capsule Take 1 capsule (290 mcg total) by mouth daily before breakfast. 11/16/23 02/14/24  Pyrtle, Carie Caddy, MD  metoprolol tartrate (LOPRESSOR) 25 MG tablet Take 1 tablet (25 mg total) by mouth 2 (two) times daily. 05/12/23   Rollene Rotunda, MD  Multiple Vitamin (MULTIVITAMIN) LIQD Take 5 mLs by mouth daily.    [provider]  nitroGLYCERIN (NITROSTAT) 0.4 MG SL tablet Place 0.4 mg under the tongue every 5 (five) minutes as needed for chest pain.    [provider]  omeprazole (PRILOSEC) 10 MG capsule Take 10 mg by mouth at bedtime.    [provider]  PEG-KCl-NaCl-NaSulf-Na Asc-C (PLENVU) 140 g SOLR Take 1 kit by mouth as directed. 11/16/23   Pyrtle, Carie Caddy, MD  polyethylene glycol (MIRALAX / GLYCOLAX) 17 g packet Take 17 g by mouth daily as needed for mild constipation.    [provider]  potassium chloride (KLOR-CON) 10 MEQ tablet Take 1  tablet (10 mEq total) by mouth daily. 06/23/23 11/11/23  Cannon Kettle, PA-C  senna (SENOKOT) 8.6 MG tablet Take 1-2 tablets by mouth daily as needed for constipation.    [provider]  sildenafil (VIAGRA) 100 MG tablet Take 100 mg by mouth daily as needed for erectile dysfunction. 12/23/21   [provider]  torsemide (DEMADEX) 20 MG tablet Take 2 tablets (40 mg total) by mouth daily. Patient taking differently: Take 20 mg by mouth daily. 06/07/23 11/11/23  Rollene Rotunda, MD    Physical Exam    Vital Signs:  [name] does not have vital signs available for review today.  Given telephonic nature of communication, physical exam is limited. AAOx3. NAD. Normal affect.  Speech and respirations are unlabored.  Accessory Clinical Findings    None  Assessment & Plan    1.  Preoperative Cardiovascular Risk Assessment:  LAPAROSCOPIC RIGHT INGUINAL HERNIA REPAIR WITH MESH , 11/24/2023, [facility] surgery, Dr. Derrell Lolling, fax #[phone]      Primary Cardiologist: Rollene Rotunda, MD  Chart reviewed as part of pre-operative protocol coverage. Given past medical history and time since last visit, based on ACC/AHA guidelines, Jaquain Applegate would be at acceptable risk for the planned procedure without further cardiovascular testing.   His RCRI is moderate risk, 6.6% risk of major cardiac event.  He is able to complete greater than 4 METS of physical activity.  Patient is on ASA for remote RCA stenting. Can hold ASA x 5 days prior to surgery and resume when medically safe to do so.   Patient was advised that if he develops new symptoms prior to surgery to contact our office to arrange a follow-up appointment.  He verbalized understanding.  I will route this recommendation to the requesting party via Epic fax function and remove from pre-op pool.  Please call with questions.  Thomasene Ripple. Devina Bezold NP-C     11/17/2023, 8:33 AM [facility]  [facility] [address] Office [phone] Fax [phone]  I spent greater than 5 minutes during the telephone encounter for evaluation of diagnosis, medication, management & plan.  Prior to patient's phone evaluation I spent greater than 10 minutes reviewing their past medical history and cardiac medications.

## 2023-11-17 NOTE — Progress Notes (Signed)
Anesthesia Chart Review:  Case: [id number] Date/Time: 11/24/23 1145   Procedure: LAPAROSCOPIC RIGHT INGUINAL HERNIA REPAIR WITH MESH (Right)   Anesthesia type: General   Pre-op diagnosis: RIGHT INGUINAL HERNIA   [facility] ROOM 08 / [facility]   Surgeons: Axel Filler, MD       DISCUSSION: Is a 77 year old male scheduled for the above procedure.  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  He is in cardiac rehab.  Notes suggest there was concern for new accelerated junctional rhythm on ECG during exercise on 11/03/23. BP 120/56. No symptoms. He was advised to follow-up with cardiology. A two week Zio monitor was mentioned, although other notes indicate that Dr. Antoine Poche reviewed EKG strips and did not see a dangerous irregular rhythm and did not recommend any change in therapy at that time.   I don't see concern for any recurrent arrhythmia during cardiac rehab, but given recent accelerated junctional rhythm advised preoperative cardiology input. He had telephonic preoperative cardiology evaluation by Edd Fabian, NP-C on 11/17/23. He wrote, "Given past medical history and time since last visit, based on ACC/AHA guidelines, Daniel Colon would be at acceptable risk for the planned procedure without further cardiovascular testing.    His RCRI is moderate risk, 6.6% risk of major cardiac event.  He is able to complete greater than 4 METS of physical activity.   Patient is on ASA for remote RCA stenting. Can hold ASA x 5 days prior to surgery and resume when medically safe to do so..."    Anesthesia team to evaluate on the day of surgery.     VS: BP (!) 144/51   Pulse 60   Temp 36.8 C (Oral)   Resp 18   Ht 5' 7.5" (1.715 m)   Wt 77.8 kg   SpO2 98%   BMI 26.46 kg/m    PROVIDERS: Garlan Fillers, MD is PCP  - Rollene Rotunda, MD is cardiologist  - Si Gaul, MD is HEM-ONC - Chilton Greathouse, MD is pulmonologist, last visit 10/05/23 for follow-up COPD, lung cancer. Notes slow recovery following recent CABG and THA but resuming golf and gym with some persistent dyspnea. Referred to pulmonary rehab. No evidence of PE or lung cancer recurrence on recent CT. Noted pending appointment for right inguinal hernia.   Erick Blinks, MD is GI   LABS: Labs reviewed: Acceptable for surgery. (all labs ordered are listed, but only abnormal results are displayed)  Labs Reviewed  CBC - Abnormal; Notable for the following components:      Result Value   RDW 17.0 (*)    All other components within normal limits  BASIC METABOLIC PANEL     IMAGES: CT Chest 09/02/23: IMPRESSION: 1. Stable examination demonstrating postoperative changes of prior right upper lobectomy with chronic scarring, pleural thickening and trace volume of chronic pleural fluid in the apex of the right hemithorax, grossly unchanged. No definitive findings to suggest locally recurrent or metastatic disease in the thorax. 2. Aortic atherosclerosis, in addition to left main and three-vessel coronary artery disease. Status post median sternotomy for CABG including LIMA to the LAD.    EKG: 08/03/23: Sinus rhythm with 1st degree A-V block Rightward axis Cannot  rule out Anterior infarct (cited on or before 25-May-2023) ST & T wave abnormality, consider inferolateral ischemia When compared with ECG of 23-Jun-2023 08:40, Questionable change in initial forces of Septal leads Confirmed by Micah Flesher ([id number]) on 08/03/2023 10:09:30 AM   CV: TEE (Intra-Op CABG) 04/27/23: PRE-OP FINDINGS  - Left Ventricle: The left ventricle has normal systolic function,  with an ejection fraction of 55-60%. The cavity size was normal. No evidence of left ventricular regional wall motion abnormalities. There is moderate  left ventricular hypertrophy.  - Right Ventricle: The right ventricle has normal systolic function. The cavity was normal. There is no increase in right ventricular wall thickness.  - Left Atrium: Left atrial size was normal in size. No left atrial/left atrial appendage thrombus was detected.  - Right Atrium: Right atrial size was normal in size.  - Interatrial Septum: Evidence of atrial level shunting detected by color flow Doppler. A small patent foramen ovale is detected.  - Pericardium: There is no evidence of pericardial effusion.  - Mitral Valve: The mitral valve is normal in structure. Mitral valve regurgitation is trivial by color flow Doppler.  - Tricuspid Valve: The tricuspid valve was normal in structure. Tricuspid valve regurgitation is trivial by color flow Doppler.  - Aortic Valve: The aortic valve is normal in structure. Aortic valve regurgitation is trivial by color flow Doppler. There is no stenosis of the aortic valve.  - Pulmonic Valve: The pulmonic valve was normal in structure. Pulmonic valve regurgitation is trivial by color flow Doppler.  - Aorta: The aortic arch was not well visualized. There is moderate dilatation of the ascending aorta, measuring 37 mm. There is evidence of plaque in the descending aorta; Grade II, measuring 2-26mm in size.  - POST-BYPASS: Good LV function post bypass, exam otherwise exam unchanged.    US Carotid 04/23/23: Summary:  - Right Carotid: Velocities in the right ICA are consistent with a 1-39% stenosis.  - Left Carotid: The extracranial vessels were near-normal with only minimal  wall thickening or plaque.  - Vertebrals:  Bilateral vertebral arteries demonstrate antegrade flow.  - Subclavians: Bilateral subclavian artery flow was disturbed.    Last LHC was 04/22/23 pre-CABG.   Echo  03/25/23: IMPRESSIONS   1. Left ventricular ejection fraction, by estimation, is 55 to 60%. The  left ventricle has normal function. The left ventricle has no regional  wall motion abnormalities. There is mild concentric left ventricular  hypertrophy. Left ventricular diastolic  parameters were normal.   2. Right ventricular systolic function is normal. The right ventricular  size is normal.   3. Left atrial size was mildly dilated.   4. Right atrial size was mildly dilated.   5. The mitral valve is normal in structure. Trivial mitral valve  regurgitation. No evidence of mitral stenosis.   6. The aortic valve is tricuspid. There is mild calcification of the  aortic valve. Aortic valve regurgitation is trivial. No aortic stenosis is  present.   7. Aortic dilatation noted. There is borderline dilatation of the  ascending aorta, measuring 38 mm.   8. The inferior vena cava is normal in size with greater than 50%  respiratory variability, suggesting right atrial pressure of 3 mmHg.    Past Medical History:  Diagnosis Date   Allergy    Arthritis    Barrett's esophagus    CAD (coronary artery disease)    a. Stent to the RCA 1996 (in [place]);  b. ETT(11/13/13): abnormal with early ST changes to  suggest ischemia;   c. LHC (11/17/13):  pLAD 30-40, D1 30, mLAD 70-75, mCFX 70, mRCA stent 40 (ISR), EF 55-65%.  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 art   BILROTH II PROCEDURE     Related to gastric ulcer    CARDIAC CATHETERIZATION     CIRCUMCISION     COLONOSCOPY     CORONARY ARTERY BYPASS GRAFT N/A 04/27/2023   Procedure: CORONARY ARTERY BYPASS GRAFTING (CABG) X 4, USING LEFT INTERNAL MAMMARY ARTERY AND RIGHT LEG GREATER SAPHENOUS VEIN HARVESTED ENDOSCOPICALLY;  Surgeon: Corliss Skains, MD;  [facility];  Service: Open Heart Surgery;  Laterality: N/A;   CORONARY PRESSURE/FFR STUDY N/A 04/22/2023   Procedure: CORONARY PRESSURE/FFR STUDY;  Surgeon: Kathleene Hazel, MD;  [facility] IN[facility] LAB;  Service: Cardiovascular;  Laterality: N/A;   INTERCOSTAL NERVE BLOCK Right 01/24/2020   Procedure: Intercostal Nerve Block;  Surgeon: Delight Ovens, MD;  [facility] OR;  Service: Thoracic;  Laterality: Right;   KNEE ARTHROSCOPY Left    LEFT HEART CATH AND CORONARY ANGIOGRAPHY N/A 04/22/2023   Procedure: LEFT HEART CATH AND CORONARY ANGIOGRAPHY;  Surgeon: Kathleene Hazel, MD;  [facility];  Service: Cardiovascular;  Laterality: N/A;   LEFT HEART CATHETERIZATION WITH CORONARY ANGIOGRAM N/A 11/17/2013   Procedure: LEFT HEART CATHETERIZATION WITH CORONARY ANGIOGRAM;  Surgeon: Micheline Chapman, MD;  Location: [facility] CATH LAB;  Service: Cardiovascular;  Laterality: N/A;   LUNG LOBECTOMY  01/2020   had chemo, no radtion   LYMPH NODE DISSECTION N/A 01/24/2020   Procedure: LYMPH NODE DISSECTION;  Surgeon: Delight Ovens, MD;  Location: [facility] OR;  Service: Thoracic;  Laterality: N/A;   NECK LESION BIOPSY     POLYPECTOMY     REFRACTIVE SURGERY     SKIN BIOPSY     Back   TEE WITHOUT CARDIOVERSION N/A 04/27/2023   Procedure: TRANSESOPHAGEAL ECHOCARDIOGRAM;  Surgeon: Corliss Skains, MD;  [facility];  Service: Open Heart Surgery;  Laterality: N/A;   Thigh surgery Left    Infant   TOTAL HIP ARTHROPLASTY Right 08/09/2023   Procedure: TOTAL HIP ARTHROPLASTY ANTERIOR APPROACH;  Surgeon: Ollen Gross, MD;  [facility]: [facility]S;  Service: Orthopedics;   Laterality: Right;   ulcer surgery     ULNAR NERVE TRANSPOSITION Right 08/05/2017   Procedure: RIGHT ULNAR NERVE DECOMPRESSION;  Surgeon: Betha Loa, MD;  Location: [facility];  Service: Orthopedics;  Laterality: Right;   UPPER GI ENDOSCOPY     VIDEO BRONCHOSCOPY N/A 01/24/2020   Procedure: VIDEO BRONCHOSCOPY;  Surgeon: Delight Ovens, MD;  [facility] OR;  Service: Thoracic;  Laterality: N/A;   WISDOM TOOTH EXTRACTION      MEDICATIONS:  acetaminophen (TYLENOL) 650 MG CR tablet   albuterol (VENTOLIN HFA) 108 (90 Base) MCG/ACT inhaler   ascorbic acid (  VITAMIN C) 500 MG tablet   aspirin EC 81 MG tablet   atorvastatin (LIPITOR) 40 MG tablet   bisacodyl (DULCOLAX) 5 MG EC tablet   BREZTRI AEROSPHERE 160-9-4.8 MCG/ACT AERO   Coenzyme Q10 (COQ10 PO)   docusate sodium (COLACE) 100 MG capsule   ezetimibe (ZETIA) 10 MG tablet   Fish Oil-Cholecalciferol (FISH OIL + D3 PO)   fluorouracil (EFUDEX) 5 % cream   GNP IRON 200 (65 Fe) MG TABS   linaclotide (LINZESS) 290 MCG CAPS capsule   metoprolol tartrate (LOPRESSOR) 25 MG tablet   Multiple Vitamin (MULTIVITAMIN) LIQD   nitroGLYCERIN (NITROSTAT) 0.4 MG SL tablet   omeprazole (PRILOSEC) 10 MG capsule   PEG-KCl-NaCl-NaSulf-Na Asc-C (PLENVU) 140 g SOLR   polyethylene glycol (MIRALAX / GLYCOLAX) 17 g packet   potassium chloride (KLOR-CON) 10 MEQ tablet   senna (SENOKOT) 8.6 MG tablet   sildenafil (VIAGRA) 100 MG tablet   torsemide (DEMADEX) 20 MG tablet   No current facility-administered medications for this encounter.    Shonna Chock, PA-C Surgical Short Stay/Anesthesiology [facility] Phone [phone] [facility] Phone [phone] 11/17/2023 2:20 PM

## 2023-11-19 ENCOUNTER — Encounter (HOSPITAL_COMMUNITY): Payer: Medicare Other

## 2023-11-22 ENCOUNTER — Encounter (HOSPITAL_COMMUNITY)
Admission: RE | Admit: 2023-11-22 | Discharge: 2023-11-22 | Disposition: A | Discharge status: Home / Self Care | Payer: Medicare Other | Source: Ambulatory Visit | Attending: Cardiology | Admitting: Cardiology

## 2023-11-22 DIAGNOSIS — Z48812 Encounter for surgical aftercare following surgery on the circulatory system: Secondary | ICD-10-CM | POA: Diagnosis not present

## 2023-11-22 DIAGNOSIS — Z951 Presence of aortocoronary bypass graft: Secondary | ICD-10-CM | POA: Diagnosis not present

## 2023-11-22 MED ORDER — POTASSIUM CHLORIDE ER 10 MEQ PO TBCR: 10.0000 meq | EXTENDED_RELEASE_TABLET | Freq: Every day | ORAL | 3 refills | Status: DC

## 2023-11-23 ENCOUNTER — Other Ambulatory Visit (HOSPITAL_COMMUNITY): Payer: Self-pay

## 2023-11-23 ENCOUNTER — Telehealth (HOSPITAL_COMMUNITY): Payer: Self-pay | Admitting: *Deleted

## 2023-11-23 NOTE — Telephone Encounter (Signed)
Alerted that this pt is scheduled for Right inguinal hernia repair on tomorrow. Called pt to confirm.  Pt made no mention of this on this past Monday when he exercised in Cardiac Rehab.  Advised pt that he will need to complete his follow up which is scheduled for 1/8 and receive clearance from the surgeon to return to exercise.  Daniel Colon verbalized understanding and hopes he can return sooner. Alanson Aly, BSN Cardiac and Emergency planning/management officer

## 2023-11-23 NOTE — Telephone Encounter (Signed)
PA has been approved. Please see other encounter for details.

## 2023-11-23 NOTE — Telephone Encounter (Signed)
Pharmacy Patient Advocate Encounter  Received notification from [employer] that Prior Authorization for LINZESS has been APPROVED from 10.28.24 to 11.27.25. Unable to obtain price due to refill too soon rejection, last fill date 12.2.24 next available fill date12.24.24   PA #/Case ID/Reference #: [id number]

## 2023-11-24 ENCOUNTER — Other Ambulatory Visit: Payer: Self-pay

## 2023-11-24 ENCOUNTER — Ambulatory Visit (HOSPITAL_BASED_OUTPATIENT_CLINIC_OR_DEPARTMENT_OTHER): Payer: Medicare Other | Admitting: Certified Registered Nurse Anesthetist

## 2023-11-24 ENCOUNTER — Encounter (HOSPITAL_COMMUNITY): Payer: Self-pay | Admitting: General Surgery

## 2023-11-24 ENCOUNTER — Ambulatory Visit (HOSPITAL_COMMUNITY): Payer: Medicare Other | Admitting: Vascular Surgery

## 2023-11-24 ENCOUNTER — Ambulatory Visit (HOSPITAL_COMMUNITY)
Admission: RE | Admit: 2023-11-24 | Discharge: 2023-11-24 | Disposition: A | Discharge status: Home / Self Care | Payer: Medicare Other | Attending: General Surgery | Admitting: General Surgery

## 2023-11-24 ENCOUNTER — Encounter (HOSPITAL_COMMUNITY)
Admission: RE | Disposition: A | Discharge status: Home / Self Care | Payer: Self-pay | Source: Home / Self Care | Attending: General Surgery

## 2023-11-24 ENCOUNTER — Encounter (HOSPITAL_COMMUNITY): Payer: Medicare Other

## 2023-11-24 DIAGNOSIS — Z87891 Personal history of nicotine dependence: Secondary | ICD-10-CM | POA: Diagnosis not present

## 2023-11-24 DIAGNOSIS — Z85118 Personal history of other malignant neoplasm of bronchus and lung: Secondary | ICD-10-CM | POA: Diagnosis not present

## 2023-11-24 DIAGNOSIS — K4090 Unilateral inguinal hernia, without obstruction or gangrene, not specified as recurrent: Secondary | ICD-10-CM

## 2023-11-24 DIAGNOSIS — K219 Gastro-esophageal reflux disease without esophagitis: Secondary | ICD-10-CM | POA: Diagnosis not present

## 2023-11-24 DIAGNOSIS — M1990 Unspecified osteoarthritis, unspecified site: Secondary | ICD-10-CM | POA: Insufficient documentation

## 2023-11-24 DIAGNOSIS — Z79899 Other long term (current) drug therapy: Secondary | ICD-10-CM | POA: Insufficient documentation

## 2023-11-24 DIAGNOSIS — J449 Chronic obstructive pulmonary disease, unspecified: Secondary | ICD-10-CM | POA: Diagnosis not present

## 2023-11-24 DIAGNOSIS — D176 Benign lipomatous neoplasm of spermatic cord: Secondary | ICD-10-CM | POA: Diagnosis not present

## 2023-11-24 DIAGNOSIS — I252 Old myocardial infarction: Secondary | ICD-10-CM | POA: Diagnosis not present

## 2023-11-24 DIAGNOSIS — I2510 Atherosclerotic heart disease of native coronary artery without angina pectoris: Secondary | ICD-10-CM | POA: Diagnosis not present

## 2023-11-24 DIAGNOSIS — I517 Cardiomegaly: Secondary | ICD-10-CM | POA: Diagnosis not present

## 2023-11-24 DIAGNOSIS — I110 Hypertensive heart disease with heart failure: Secondary | ICD-10-CM | POA: Insufficient documentation

## 2023-11-24 DIAGNOSIS — I10 Essential (primary) hypertension: Secondary | ICD-10-CM | POA: Diagnosis not present

## 2023-11-24 DIAGNOSIS — Z951 Presence of aortocoronary bypass graft: Secondary | ICD-10-CM | POA: Diagnosis not present

## 2023-11-24 DIAGNOSIS — K279 Peptic ulcer, site unspecified, unspecified as acute or chronic, without hemorrhage or perforation: Secondary | ICD-10-CM | POA: Diagnosis not present

## 2023-11-24 DIAGNOSIS — I509 Heart failure, unspecified: Secondary | ICD-10-CM | POA: Insufficient documentation

## 2023-11-24 DIAGNOSIS — E785 Hyperlipidemia, unspecified: Secondary | ICD-10-CM | POA: Diagnosis not present

## 2023-11-24 DIAGNOSIS — Z955 Presence of coronary angioplasty implant and graft: Secondary | ICD-10-CM | POA: Diagnosis not present

## 2023-11-24 HISTORY — PX: INGUINAL HERNIA REPAIR: SHX194

## 2023-11-24 HISTORY — PX: INSERTION OF MESH: SHX5868

## 2023-11-24 SURGERY — REPAIR, HERNIA, INGUINAL, LAPAROSCOPIC
Anesthesia: General | Site: Inguinal | Laterality: Right

## 2023-11-24 MED ORDER — PROPOFOL 10 MG/ML IV BOLUS
INTRAVENOUS | Status: AC
  Filled 2023-11-24: qty 20

## 2023-11-24 MED ORDER — AMISULPRIDE (ANTIEMETIC) 5 MG/2ML IV SOLN: 10.0000 mg | Freq: Once | INTRAVENOUS | Status: DC | PRN

## 2023-11-24 MED ORDER — LACTATED RINGERS IV SOLN: INTRAVENOUS | Status: DC

## 2023-11-24 MED ORDER — TRAMADOL HCL 50 MG PO TABS: 50.0000 mg | ORAL_TABLET | Freq: Four times a day (QID) | ORAL | 0 refills | Status: DC | PRN

## 2023-11-24 MED ORDER — ENSURE PRE-SURGERY PO LIQD: 296.0000 mL | Freq: Once | ORAL | Status: DC

## 2023-11-24 MED ORDER — ORAL CARE MOUTH RINSE: 15.0000 mL | Freq: Once | OROMUCOSAL | Status: AC

## 2023-11-24 MED ORDER — HYDROMORPHONE HCL 1 MG/ML IJ SOLN: 0.2500 mg | INTRAMUSCULAR | Status: DC | PRN

## 2023-11-24 MED ORDER — CHLORHEXIDINE GLUCONATE CLOTH 2 % EX PADS: 6.0000 | MEDICATED_PAD | Freq: Once | CUTANEOUS | Status: DC

## 2023-11-24 MED ORDER — ROCURONIUM BROMIDE 10 MG/ML (PF) SYRINGE
PREFILLED_SYRINGE | INTRAVENOUS | Status: DC | PRN
  Administered 2023-11-24: 50 mg via INTRAVENOUS

## 2023-11-24 MED ORDER — ACETAMINOPHEN 500 MG PO TABS: 1000.0000 mg | ORAL_TABLET | ORAL | Status: AC

## 2023-11-24 MED ORDER — BUPIVACAINE HCL 0.25 % IJ SOLN
INTRAMUSCULAR | Status: DC | PRN
  Administered 2023-11-24: 7 mL

## 2023-11-24 MED ORDER — FENTANYL CITRATE (PF) 250 MCG/5ML IJ SOLN
INTRAMUSCULAR | Status: DC | PRN
  Administered 2023-11-24: 100 ug via INTRAVENOUS
  Administered 2023-11-24: 50 ug via INTRAVENOUS

## 2023-11-24 MED ORDER — OXYCODONE HCL 5 MG PO TABS: 5.0000 mg | ORAL_TABLET | Freq: Once | ORAL | Status: DC | PRN

## 2023-11-24 MED ORDER — 0.9 % SODIUM CHLORIDE (POUR BTL) OPTIME
TOPICAL | Status: DC | PRN
  Administered 2023-11-24: 1000 mL

## 2023-11-24 MED ORDER — LIDOCAINE HCL (CARDIAC) PF 100 MG/5ML IV SOSY
PREFILLED_SYRINGE | INTRAVENOUS | Status: DC | PRN
  Administered 2023-11-24: 60 mg via INTRAVENOUS

## 2023-11-24 MED ORDER — CEFAZOLIN SODIUM-DEXTROSE 2-4 GM/100ML-% IV SOLN
2.0000 g | INTRAVENOUS | Status: AC
  Administered 2023-11-24: 2 g via INTRAVENOUS

## 2023-11-24 MED ORDER — CEFAZOLIN SODIUM-DEXTROSE 2-4 GM/100ML-% IV SOLN
INTRAVENOUS | Status: AC
  Filled 2023-11-24: qty 100

## 2023-11-24 MED ORDER — BUPIVACAINE HCL (PF) 0.25 % IJ SOLN
INTRAMUSCULAR | Status: AC
  Filled 2023-11-24: qty 30

## 2023-11-24 MED ORDER — ONDANSETRON HCL 4 MG/2ML IJ SOLN
INTRAMUSCULAR | Status: DC | PRN
  Administered 2023-11-24: 4 mg via INTRAVENOUS

## 2023-11-24 MED ORDER — PHENYLEPHRINE 80 MCG/ML (10ML) SYRINGE FOR IV PUSH (FOR BLOOD PRESSURE SUPPORT)
PREFILLED_SYRINGE | INTRAVENOUS | Status: DC | PRN
  Administered 2023-11-24: 80 ug via INTRAVENOUS

## 2023-11-24 MED ORDER — SUGAMMADEX SODIUM 200 MG/2ML IV SOLN
INTRAVENOUS | Status: DC | PRN
  Administered 2023-11-24: 200 mg via INTRAVENOUS

## 2023-11-24 MED ORDER — DEXAMETHASONE SODIUM PHOSPHATE 10 MG/ML IJ SOLN
INTRAMUSCULAR | Status: DC | PRN
  Administered 2023-11-24: 10 mg via INTRAVENOUS

## 2023-11-24 MED ORDER — FENTANYL CITRATE (PF) 250 MCG/5ML IJ SOLN
INTRAMUSCULAR | Status: AC
  Filled 2023-11-24: qty 5

## 2023-11-24 MED ORDER — ACETAMINOPHEN 500 MG PO TABS
ORAL_TABLET | ORAL | Status: AC
  Administered 2023-11-24: 1000 mg via ORAL
  Filled 2023-11-24: qty 2

## 2023-11-24 MED ORDER — CHLORHEXIDINE GLUCONATE 0.12 % MT SOLN
15.0000 mL | Freq: Once | OROMUCOSAL | Status: AC
  Administered 2023-11-24: 15 mL via OROMUCOSAL

## 2023-11-24 MED ORDER — OXYCODONE HCL 5 MG/5ML PO SOLN: 5.0000 mg | Freq: Once | ORAL | Status: DC | PRN

## 2023-11-24 MED ORDER — EPHEDRINE SULFATE-NACL 50-0.9 MG/10ML-% IV SOSY
PREFILLED_SYRINGE | INTRAVENOUS | Status: DC | PRN
  Administered 2023-11-24: 5 mg via INTRAVENOUS

## 2023-11-24 MED ORDER — SODIUM CHLORIDE 0.9 % IV SOLN: 12.5000 mg | INTRAVENOUS | Status: DC | PRN

## 2023-11-24 MED ORDER — PROPOFOL 10 MG/ML IV BOLUS
INTRAVENOUS | Status: DC | PRN
  Administered 2023-11-24: 120 mg via INTRAVENOUS

## 2023-11-24 SURGICAL SUPPLY — 33 items
BAG COUNTER SPONGE SURGICOUNT (BAG) ×1 IMPLANT
CANISTER SUCT 3000ML PPV (MISCELLANEOUS) IMPLANT
COVER SURGICAL LIGHT HANDLE (MISCELLANEOUS) ×1 IMPLANT
DERMABOND ADVANCED .7 DNX12 (GAUZE/BANDAGES/DRESSINGS) ×1 IMPLANT
DISSECTOR BLUNT TIP ENDO 5MM (MISCELLANEOUS) IMPLANT
ELECT REM PT RETURN 9FT ADLT (ELECTROSURGICAL) ×1
ELECTRODE REM PT RTRN 9FT ADLT (ELECTROSURGICAL) ×1 IMPLANT
GLOVE BIO SURGEON STRL SZ7.5 (GLOVE) ×2 IMPLANT
GOWN STRL REUS W/ TWL LRG LVL3 (GOWN DISPOSABLE) ×2 IMPLANT
GOWN STRL REUS W/ TWL XL LVL3 (GOWN DISPOSABLE) ×1 IMPLANT
KIT BASIN OR (CUSTOM PROCEDURE TRAY) ×1 IMPLANT
KIT TURNOVER KIT B (KITS) ×1 IMPLANT
MESH 3DMAX 5X7 RT XLRG (Mesh General) IMPLANT
NDL INSUFFLATION 14GA 120MM (NEEDLE) IMPLANT
NEEDLE INSUFFLATION 14GA 120MM (NEEDLE) ×1
NS IRRIG 1000ML POUR BTL (IV SOLUTION) ×1 IMPLANT
PAD ARMBOARD 7.5X6 YLW CONV (MISCELLANEOUS) ×2 IMPLANT
RELOAD STAPLE 4.0 BLU F/HERNIA (INSTRUMENTS) IMPLANT
RELOAD STAPLE 4.8 BLK F/HERNIA (STAPLE) IMPLANT
RELOAD STAPLE HERNIA 4.0 BLUE (INSTRUMENTS) ×1
RELOAD STAPLE HERNIA 4.8 BLK (STAPLE)
SCISSORS LAP 5X35 DISP (ENDOMECHANICALS) ×1 IMPLANT
SET TUBE SMOKE EVAC HIGH FLOW (TUBING) ×1 IMPLANT
STAPLER HERNIA 12 8.5 360D (INSTRUMENTS) IMPLANT
SUT MNCRL AB 4-0 PS2 18 (SUTURE) ×1 IMPLANT
SUT VIC AB 1 CT1 27XBRD ANBCTR (SUTURE) IMPLANT
TOWEL GREEN STERILE FF (TOWEL DISPOSABLE) ×1 IMPLANT
TRAY LAPAROSCOPIC MC (CUSTOM PROCEDURE TRAY) ×1 IMPLANT
TROCAR OPTICAL SHORT 5MM (TROCAR) ×1 IMPLANT
TROCAR OPTICAL SLV SHORT 5MM (TROCAR) ×1 IMPLANT
TROCAR Z THREAD OPTICAL 12X100 (TROCAR) ×1 IMPLANT
WARMER LAPAROSCOPE (MISCELLANEOUS) ×1 IMPLANT
WATER STERILE IRR 1000ML POUR (IV SOLUTION) ×1 IMPLANT

## 2023-11-24 NOTE — H&P (Signed)
Chief Complaint: New Consultation   History of Present Illness: Daniel Colon is a 77 y.o. male who is seen today as an office consultation at the request of Dr. Al Corpus for evaluation of New Consultation .  Patient is a 77 year old male, who comes in with history of small cell lung cancer, CAD status post bypass, recent hip replacement. Patient comes in with a right inguinal hernia. He states this was noticeable after his hip replacement surgery. He does have some pain discomfort to the area he has been wearing a hernia truss which has been helping. He had no signs or symptoms of incarceration or strangulation.  Patient has had a previous upper midline incision for a Billroth II for perforated gastric ulcer.  Review of Systems: A complete review of systems was obtained from the patient. I have reviewed this information and discussed as appropriate with the patient. See HPI as well for other ROS.  Review of Systems  Constitutional: Negative for fever.  HENT: Negative for congestion.  Eyes: Negative for blurred vision.  Respiratory: Negative for cough, shortness of breath and wheezing.  Cardiovascular: Negative for chest pain and palpitations.  Gastrointestinal: Negative for heartburn.  Genitourinary: Negative for dysuria.  Musculoskeletal: Negative for myalgias.  Skin: Negative for rash.  Neurological: Negative for dizziness and headaches.  Psychiatric/Behavioral: Negative for depression and suicidal ideas.  All other systems reviewed and are negative.   Medical History: Past Medical History:  Diagnosis Date  Anemia  Aneurysm (CMS-HCC)  Arrhythmia  Arthritis  CHF (congestive heart failure) (CMS/HHS-HCC)  COPD (chronic obstructive pulmonary disease) (CMS/HHS-HCC)  GERD (gastroesophageal reflux disease)  History of cancer  History of myocardial infarction  Hyperlipidemia  Hypertension   There is no problem list on file for this patient.  History reviewed. No pertinent surgical  history.   No Known Allergies  Current Outpatient Medications on File Prior to Visit  Medication Sig Dispense Refill  acetaminophen (TYLENOL) 650 MG ER tablet Take by mouth  albuterol MDI, PROVENTIL, VENTOLIN, PROAIR, HFA 90 mcg/actuation inhaler Inhale into the lungs  atorvastatin (LIPITOR) 40 MG tablet Take 40 mg by mouth at bedtime  bisacodyL (DULCOLAX) 5 mg EC tablet Take by mouth  BREZTRI AEROSPHERE 160-9-4.8 mcg/actuation inhaler  docusate (COLACE) 100 MG capsule Take by mouth  ezetimibe (ZETIA) 10 mg tablet  fluorouraciL (EFUDEX) 5 % cream Apply topically  HYDROcodone-acetaminophen (NORCO) 5-325 mg tablet Take by mouth  KLOR-CON M10 10 mEq ER tablet  methocarbamoL (ROBAXIN) 500 MG tablet Take 500 mg by mouth every 6 (six) hours as needed  metoprolol tartrate (LOPRESSOR) 25 MG tablet  nitroGLYcerin (NITROSTAT) 0.4 MG SL tablet  omeprazole (PRILOSEC) 10 MG DR capsule  polyethylene glycol (MIRALAX) packet Take 17 g by mouth once daily as needed  rivaroxaban (XARELTO) 10 mg tablet Take by mouth  sennosides (SENOKOT) 8.6 mg tablet Take 1 tablet by mouth once daily as needed  sildenafiL (VIAGRA) 100 MG tablet  TORsemide (DEMADEX) 20 MG tablet   No current facility-administered medications on file prior to visit.   History reviewed. No pertinent family history.   Social History   Tobacco Use  Smoking Status Former  Types: Cigarettes  Smokeless Tobacco Never    Social History   Socioeconomic History  Marital status: Married  Tobacco Use  Smoking status: Former  Types: Cigarettes  Smokeless tobacco: Never  Substance and Sexual Activity  Alcohol use: Yes  Drug use: Never   Social Drivers of Health   Food Insecurity: No Food Insecurity (08/09/2023)  [facility]  Hunger Vital Sign  Worried About Running Out of Food in the Last Year: Never true  Ran Out of Food in the Last Year: Never true  Transportation Needs: No Transportation Needs (08/09/2023)   Received from [facility] - Transportation  Lack of Transportation (Medical): No  Lack of Transportation (Non-Medical): No   Objective:   Vitals:  BP (!) 155/84   Pulse 60   Temp 97.9 F (36.6 C) (Oral)   Resp 18   Ht 5' 7.5" (1.715 m)   Wt 77.1 kg   SpO2 98%   BMI 26.23 kg/m    Body mass index is 25.2 kg/m. Physical Exam Constitutional:  Appearance: Normal appearance.  HENT:  Head: Normocephalic and atraumatic.  Nose: Nose normal. No congestion.  Mouth/Throat:  Mouth: Mucous membranes are moist.  Pharynx: Oropharynx is clear.  Eyes:  Pupils: Pupils are equal, round, and reactive to light.  Cardiovascular:  Rate and Rhythm: Normal rate and regular rhythm.  Pulses: Normal pulses.  Heart sounds: Normal heart sounds. No murmur heard. No friction rub. No gallop.  Pulmonary:  Effort: Pulmonary effort is normal. No respiratory distress.  Breath sounds: Normal breath sounds. No stridor. No wheezing, rhonchi or rales.  Abdominal:  General: Abdomen is flat.  Hernia: A hernia is present. Hernia is present in the left inguinal area and right inguinal area.  Musculoskeletal:  General: Normal range of motion.  Cervical back: Normal range of motion.  Skin: General: Skin is warm and dry.  Neurological:  General: No focal deficit present.  Mental Status: He is alert and oriented to person, place, and time.  Psychiatric:  Mood and Affect: Mood normal.  Thought Content: Thought content normal.     Assessment and Plan:  Diagnoses and all orders for this visit:  Unilateral inguinal hernia without obstruction or gangrene, recurrence not specified   Daniel Colon is a 77 y.o. male   We will proceed to the OR for a laparoscopic right inguinal hernia repair with mesh. All risks and benefits were discussed with the patient, to generally include infection, bleeding, damage to surrounding structures, acute and chronic nerve pain, and recurrence. Alternatives were  offered and described. All questions were answered and the patient voiced understanding of the procedure and wishes to proceed at this point.  No follow-ups on file.  Axel Filler, MD, [facility], [place] [facility]

## 2023-11-24 NOTE — Anesthesia Postprocedure Evaluation (Signed)
Anesthesia Post Note  Patient: Daniel Colon  Procedure(s) Performed: LAPAROSCOPIC RIGHT INGUINAL HERNIA REPAIR WITH MESH (Right: Inguinal) INSERTION OF MESH (Right: Inguinal)     Patient location during evaluation: PACU Anesthesia Type: General Level of consciousness: awake and alert Pain management: pain level controlled Vital Signs Assessment: post-procedure vital signs reviewed and stable Respiratory status: spontaneous breathing, nonlabored ventilation and respiratory function stable Cardiovascular status: blood pressure returned to baseline and stable Postop Assessment: no apparent nausea or vomiting Anesthetic complications: no   No notable events documented.  Last Vitals:  Vitals:   11/24/23 1415 11/24/23 1430  BP: (!) 153/78 (!) 150/79  Pulse: (!) 58 (!) 57  Resp: 15 18  Temp:  36.5 C  SpO2: 96% 96%    Last Pain:  Vitals:   11/24/23 1430  TempSrc:   PainSc: 0-No pain                 Lowella Curb

## 2023-11-24 NOTE — Transfer of Care (Signed)
Immediate Anesthesia Transfer of Care Note  Patient: Daniel Colon  Procedure(s) Performed: LAPAROSCOPIC RIGHT INGUINAL HERNIA REPAIR WITH MESH (Right: Inguinal) INSERTION OF MESH (Right: Inguinal)  Patient Location: PACU  Anesthesia Type:General  Level of Consciousness: drowsy and patient cooperative  Airway & Oxygen Therapy: Patient Spontanous Breathing  Post-op Assessment: Report given to RN, Post -op Vital signs reviewed and stable, and Patient moving all extremities X 4  Post vital signs: Reviewed and stable  Last Vitals:  Vitals Value Taken Time  BP 155/84 11/24/23 1355  Temp 36.6 C 11/24/23 1355  Pulse 59 11/24/23 1355  Resp 19 11/24/23 1355  SpO2 97 % 11/24/23 1355  Vitals shown include unfiled device data.  Last Pain:  Vitals:   11/24/23 1035  TempSrc:   PainSc: 0-No pain         Complications: No notable events documented.

## 2023-11-24 NOTE — Op Note (Signed)
11/24/2023  1:42 PM  PATIENT:  [name]  77 y.o. male  PRE-OPERATIVE DIAGNOSIS:  RIGHT INGUINAL HERNIA  POST-OPERATIVE DIAGNOSIS:  RIGHT INDIRECT INGUINAL HERNIA, LARGE  PROCEDURE:  Procedure(s): LAPAROSCOPIC RIGHT INGUINAL HERNIA REPAIR WITH MESH (Right) INSERTION OF MESH (Right)  SURGEON:  Surgeons and Role:    * Axel Filler, MD - Primary  ASSISTANTS: Rockwell Germany, RNFA   ANESTHESIA:   local and general  EBL:  minimal   BLOOD ADMINISTERED:none  DRAINS: none   LOCAL MEDICATIONS USED:  BUPIVICAINE   SPECIMEN:  No Specimen  DISPOSITION OF SPECIMEN:  N/A  COUNTS:  YES  TOURNIQUET:  * No tourniquets in log *  DICTATION: .Dragon Dictation  Counts: reported as correct x 2  Findings:  The patient had a large right indirect hernia and a moderated sized cord lipoma  Indications for procedure:  The patient is a 77 year old male with a right inguinal hernia for several months. Patient complained of symptomatology to his right inguinal area. The patient was taken back for elective inguinal hernia repair.  Details of the procedure: The patient was taken back to the operating room. The patient was placed in supine position with bilateral SCDs in place.  The patient was prepped and draped in the usual sterile fashion.  After appropriate anitbiotics were confirmed, a time-out was confirmed and all facts were verified.  0.25% Marcaine was used to infiltrate the umbilical area. A 11-blade was used to cut down the skin and blunt dissection was used to get the anterior fashion.  The anterior fascia was incised approximately 1 cm and the muscles were retracted laterally. Blunt dissection was then used to create a space in the preperitoneal area. At this time a 10 mm camera was then introduced into the space and advanced the pubic tubercle and a 12 mm trocar was placed over this and insufflation was started.  At this time and space was created from medial to laterally the  preperitoneal space.  Cooper's ligament was initially cleaned off.  The hernia sac was identified in the indirect space. Dissection of the hernia sac and cord structures was undertaken the vas deferens was identified and protected in all parts of the case.    Once the hernia sac was taken down to approximately the umbilicus a Bard 3D Max mesh, size: Barney Drain, was  introduced into the preperitoneal space.  The mesh was brought over to cover the direct and indirect hernia spaces.  This was anchored into place and secured to Cooper's ligament with 4.79mm staples from a Coviden hernia stapler. It was anchored to the anterior abdominal wall with 4.8 mm staples. The hernia sac was seen lying posterior to the mesh. There was no staples placed laterally. The insufflation was evacuated and the peritoneum was seen posterior to the mesh. The trochars were removed. The anterior fascia was reapproximated using #1 Vicryl on a UR- 6.  Intra-abdominal air was evacuated and the Veress needle removed. The skin was reapproximated using 4-0 Monocryl subcuticular fashion and Dermabond. The patient was awakened from general anesthesia and taken to recovery in stable condition.    PLAN OF CARE: Discharge to home after PACU  PATIENT DISPOSITION:  PACU - hemodynamically stable.   Delay start of Pharmacological VTE agent (>24hrs) due to surgical blood loss or risk of bleeding: not applicable

## 2023-11-24 NOTE — Anesthesia Procedure Notes (Signed)
Procedure Name: Intubation Date/Time: 11/24/2023 1:03 PM  Performed by: Alease Medina, CRNAPre-anesthesia Checklist: Patient identified, Emergency Drugs available, Suction available and Patient being monitored Patient Re-evaluated:Patient Re-evaluated prior to induction Oxygen Delivery Method: Circle system utilized Preoxygenation: Pre-oxygenation with 100% oxygen Induction Type: IV induction Ventilation: Mask ventilation without difficulty Laryngoscope Size: Mac and 4 Grade View: Grade I Tube type: Oral Tube size: 7.5 mm Number of attempts: 1 Airway Equipment and Method: Stylet and Oral airway Placement Confirmation: ETT inserted through vocal cords under direct vision, positive ETCO2 and breath sounds checked- equal and bilateral Secured at: 22 cm Tube secured with: Tape Dental Injury: Teeth and Oropharynx as per pre-operative assessment

## 2023-11-24 NOTE — Discharge Instructions (Signed)

## 2023-11-25 ENCOUNTER — Telehealth (HOSPITAL_COMMUNITY): Payer: Self-pay | Admitting: *Deleted

## 2023-11-25 ENCOUNTER — Telehealth (HOSPITAL_COMMUNITY): Payer: Self-pay

## 2023-11-25 ENCOUNTER — Encounter (HOSPITAL_COMMUNITY): Payer: Self-pay | Admitting: General Surgery

## 2023-11-25 NOTE — Telephone Encounter (Signed)
Toniann Fail from [facility] called and would like someone to call her back in regards to [facility] and what the note needs to entail as well as what his exercise will be!

## 2023-11-25 NOTE — Telephone Encounter (Signed)
Pt called in regards to his appts being canceled. Adv pt as to why his appts was canceled,   By:  Chelsea Aus   Cancel Rsn: Provider (Scheduled for procederure on 12/4. will need to complete follow up on 1/8 and cleared to return to exercise)   Pt stated his doctor adv him that he can return to CR sooner, adv pt we would need a letter from doctor office for clearance, patient verbalized understanding.

## 2023-11-25 NOTE — Telephone Encounter (Signed)
Daniel Aus, RN  Daniel Colon; Colon, Daniel Fudge, LPN; Daniel Colon,  Thank you for reaching out to [place].  I only found out about his procedure on accident on Tuesday.Daniel Colon did not tell us on Monday that  he was having surgery.  I called him and explained to him our protocol. Depending upon the type of surgery, we generally would have the pt complete their follow up appt  for wound assessment and clearance as well as guidance  on what activities the pt can and can not do prior to returning to exercise. We are cautious as we do not want pt to injure or slow their post op recovery.  Jerl exercises in cardiac rehab 3 x week. MWF. We begin exercise with 15 minute warm up upper and lower body Exercises on 2 stations 15 minutes each for total of 30 minutes.  Note that Daniel Colon exercises hard and vigorous.  We do monitor his oxygen saturation because of his lung history when he exercises he does so vigorously.  15 minutes on the nustep at level 6.0  The pt is seated and moves both arms and legs in a stepping fashion. He averages about 80 steps per minute  15 minutes on the recumbent bike level 4.  The pt is seated in a reclined position and pedals with their legs - no arms are involved.  15 minutes - strength training with 4 pound hand weights Cool down stretches Total time one hour  I cancelled his appts leading up to his follow up appt on 1/8.  If it is decided that he can return prior to his follow up appt - will need this documented from Dr. Derrell Lolling the return date for exercise and the activities he can do as well as can not.  Thank you again for reaching out  Daniel Lineman RN, BSN Cardiac and Pulmonary Rehab Nurse Navigator [phone]       Previous Messages    ----- Message ----- From: Lonia Mad Sent: 11/25/2023  11:32 AM EST To: Cammy Copa, RN; Daniel Aus, RN Subject: Annell Greening: Cardiac Rehab note                         ----- Message  ----- From: Daniel Colon Sent: 11/25/2023  11:25 AM EST To: Rise Mu, LPN; Phylliss Blakes; * Subject: Cardiac Rehab note                            Good Morning,  Patient has called requesting a note stating he can resume cardiac rehab on 11/29/23. Patient just had hernia repair surgery on 11/24/23.  I wanted to know what type of activities he would be engaging in? He said, he would just be riding a stationary bike?  Dr Derrell Lolling does not usually want patient lifting, pushing or pulling more than 20 pounds for 4-6 weeks after hernia repair.  Thank you for you assistance in this matter.  Toniann Fail, Nursing Triage at Dr Derrell Lolling office.  Telephone number for Triage [phone] if need to speak with me.

## 2023-11-26 ENCOUNTER — Encounter (HOSPITAL_COMMUNITY): Payer: Medicare Other

## 2023-11-26 ENCOUNTER — Telehealth (HOSPITAL_COMMUNITY): Payer: Self-pay | Admitting: *Deleted

## 2023-11-26 NOTE — Telephone Encounter (Signed)
----- Message from Jerry Caras sent at 11/26/2023 11:10 AM EST ----- Regarding: RE: Cardiac Rehab note Discussed the below with Dr Derrell Lolling, who reports, he just does not want patient to lift more than 20 pounds for 6 weeks after surgery. Dr Derrell Lolling is okay with patient starting cardiac rehab on Monday, 11/29/2023.   I will give a note to patient.   Thank you for your help.  Toniann Fail, Triage ----- Message ----- From: Chelsea Aus, RN Sent: 11/25/2023  12:09 PM EST To: Reinaldo Meeker; Rise Mu, LPN; # Subject: FW: Cardiac Rehab note                         Toniann Fail,  Thank you for reaching out to [place].  I only found out about his procedure on accident on Tuesday.Orvilla Fus did not tell us on Monday that  he was having surgery.  I called him and explained to him our protocol. Depending upon the type of surgery, we generally would have the pt complete their follow up appt  for wound assessment and clearance as well as guidance  on what activities the pt can and can not do prior to returning to exercise. We are cautious as we do not want pt to injure or slow their post op recovery.   Siaosi exercises in cardiac rehab 3 x week. MWF. We begin exercise with 15 minute warm up upper and lower body Exercises on 2 stations 15 minutes each for total of 30 minutes.  Note that Rawson exercises hard and vigorous.  We do monitor his oxygen saturation because of his lung history when he exercises he does so vigorously.   15 minutes on the nustep at level 6.0  The pt is seated and moves both arms and legs in a stepping fashion. He averages about 80 steps per minute  15 minutes on the recumbent bike level 4.  The pt is seated in a reclined position and pedals with their legs - no arms are involved.  15 minutes - strength training with 4 pound hand weights Cool down stretches Total time one hour  I cancelled his appts leading up to his follow up appt on 1/8.  If it is decided that he can return prior to his  follow up appt - will need this documented from Dr. Derrell Lolling the return date for exercise and the activities he can do as well as can not.  Thank you again for reaching out  Karlene Lineman RN, BSN Cardiac and Pulmonary Rehab Nurse Navigator  [phone] ----- Message ----- From: Lonia Mad Sent: 11/25/2023  11:32 AM EST To: Cammy Copa, RN; Chelsea Aus, RN Subject: Annell Greening: Cardiac Rehab note                          ----- Message ----- From: Reinaldo Meeker Sent: 11/25/2023  11:25 AM EST To: Rise Mu, LPN; Phylliss Blakes; # Subject: Cardiac Rehab note                             Good Morning,  Patient has called requesting a note stating he can resume cardiac rehab on 11/29/23. Patient just had hernia repair surgery on 11/24/23.   I wanted to know what type of activities he would be engaging in? He said, he would just be riding a stationary bike?   Dr  Derrell Lolling does not usually want patient lifting, pushing or pulling more than 20 pounds for 4-6 weeks after hernia repair.  Thank you for you assistance in this matter.  Toniann Fail, Nursing Triage at Dr Derrell Lolling office.  Telephone number for Triage [phone] if need to speak with me.

## 2023-11-26 NOTE — Telephone Encounter (Signed)
----- Message from Jerry Caras sent at 11/26/2023  1:38 PM EST ----- Regarding: RE: Cardiac Rehab note Driving is okay if patient is not taking a narcotic for pain, able to wear seatbelt and can react quickly enough to safely drive vehicle. I did discuss this with patient who had reported, his plans to drive 2 days after surgery.   I even made him aware that he may hesitate and not react as quickly due to surgical site pain. This could result in an accident while driving, if he hesitates due to pain and does not apply the brake as quickly as normal.   He expressed his gratitude for my concern, but reported, he was going to drive. He thought he was fine.   Toniann Fail ----- Message ----- From: Chelsea Aus, RN Sent: 11/26/2023  11:53 AM EST To: Reinaldo Meeker Subject: RE: Cardiac Rehab note                         Thank you for your help!  Since the only restriction was for lifting no more than 20 pounds, all other normal activities with exercise are appropriate for him to do.  Driving okay with taking a non narcotic for pain? Karlene Lineman RN, BSN Cardiac and Pulmonary Rehab Nurse Navigator ----- Message ----- From: Reinaldo Meeker Sent: 11/26/2023  11:17 AM EST To: Chelsea Aus, RN Subject: RE: Cardiac Rehab note                         Discussed the below with Dr Derrell Lolling, who reports, he just does not want patient to lift more than 20 pounds for 6 weeks after surgery. Dr Derrell Lolling is okay with patient starting cardiac rehab on Monday, 11/29/2023.   I will give a note to patient.   Thank you for your help.  Toniann Fail, Triage ----- Message ----- From: Chelsea Aus, RN Sent: 11/25/2023  12:09 PM EST To: Reinaldo Meeker; Rise Mu, LPN; # Subject: FW: Cardiac Rehab note                         Toniann Fail,  Thank you for reaching out to [place].  I only found out about his procedure on accident on Tuesday.Orvilla Fus did not tell us on Monday that  he was having surgery.  I called him and  explained to him our protocol. Depending upon the type of surgery, we generally would have the pt complete their follow up appt  for wound assessment and clearance as well as guidance  on what activities the pt can and can not do prior to returning to exercise. We are cautious as we do not want pt to injure or slow their post op recovery.   Romaine exercises in cardiac rehab 3 x week. MWF. We begin exercise with 15 minute warm up upper and lower body Exercises on 2 stations 15 minutes each for total of 30 minutes.  Note that Kolden exercises hard and vigorous.  We do monitor his oxygen saturation because of his lung history when he exercises he does so vigorously.   15 minutes on the nustep at level 6.0  The pt is seated and moves both arms and legs in a stepping fashion. He averages about 80 steps per minute  15 minutes on the recumbent bike level 4.  The pt is seated in a reclined position and pedals with their  legs - no arms are involved.  15 minutes - strength training with 4 pound hand weights Cool down stretches Total time one hour  I cancelled his appts leading up to his follow up appt on 1/8.  If it is decided that he can return prior to his follow up appt - will need this documented from Dr. Derrell Lolling the return date for exercise and the activities he can do as well as can not.  Thank you again for reaching out  Karlene Lineman RN, BSN Cardiac and Pulmonary Rehab Nurse Navigator  [phone] ----- Message ----- From: Lonia Mad Sent: 11/25/2023  11:32 AM EST To: Cammy Copa, RN; Chelsea Aus, RN Subject: Annell Greening: Cardiac Rehab note                          ----- Message ----- From: Reinaldo Meeker Sent: 11/25/2023  11:25 AM EST To: Rise Mu, LPN; Phylliss Blakes; # Subject: Cardiac Rehab note                             Good Morning,  Patient has called requesting a note stating he can resume cardiac rehab on 11/29/23. Patient just had hernia repair surgery  on 11/24/23.   I wanted to know what type of activities he would be engaging in? He said, he would just be riding a stationary bike?   Dr Derrell Lolling does not usually want patient lifting, pushing or pulling more than 20 pounds for 4-6 weeks after hernia repair.  Thank you for you assistance in this matter.  Toniann Fail, Nursing Triage at Dr Derrell Lolling office.  Telephone number for Triage [phone] if need to speak with me.

## 2023-11-29 ENCOUNTER — Encounter (HOSPITAL_COMMUNITY): Payer: Medicare Other

## 2023-11-29 ENCOUNTER — Encounter (HOSPITAL_COMMUNITY)
Admission: RE | Admit: 2023-11-29 | Discharge: 2023-11-29 | Disposition: A | Discharge status: Home / Self Care | Payer: Medicare Other | Source: Ambulatory Visit | Attending: Cardiology | Admitting: Cardiology

## 2023-11-29 DIAGNOSIS — Z48812 Encounter for surgical aftercare following surgery on the circulatory system: Secondary | ICD-10-CM | POA: Diagnosis not present

## 2023-11-29 DIAGNOSIS — Z951 Presence of aortocoronary bypass graft: Secondary | ICD-10-CM

## 2023-11-29 NOTE — Progress Notes (Signed)
Cardiac Individual Treatment Plan  Patient Details  Name: Daniel Colon MRN: [id number] Date of Birth: Jun 24, 1946 Referring Provider:   Flowsheet Row INTENSIVE CARDIAC REHAB ORIENT from 10/12/2023 in [facility]  Referring Provider Melany Guernsey       Initial Encounter Date:  Flowsheet Row INTENSIVE CARDIAC REHAB ORIENT from 10/12/2023 in [facility]  Date 10/12/23       Visit Diagnosis: 04/27/23 S/P CABG x 4  Patient's Home Medications on Admission:  Current Outpatient Medications:    acetaminophen (TYLENOL) 650 MG CR tablet, Take 1,300 mg by mouth every 8 (eight) hours as needed for pain (Mild pain)., Disp: , Rfl:    albuterol (VENTOLIN HFA) 108 (90 Base) MCG/ACT inhaler, Inhale 2 puffs into the lungs daily as needed for wheezing or shortness of breath (Depend on activity level)., Disp: , Rfl:    ascorbic acid (VITAMIN C) 500 MG tablet, Take 500 mg by mouth daily., Disp: , Rfl:    aspirin EC 81 MG tablet, Take 1 tablet (81 mg total) by mouth daily. Swallow whole., Disp: , Rfl:    atorvastatin (LIPITOR) 40 MG tablet, Take 1 tablet (40 mg total) by mouth at bedtime., Disp: 90 tablet, Rfl: 3   bisacodyl (DULCOLAX) 5 MG EC tablet, Take 5 mg by mouth daily as needed for moderate constipation., Disp: , Rfl:    BREZTRI AEROSPHERE 160-9-4.8 MCG/ACT AERO, Inhale 2 puffs into the lungs in the morning and at bedtime., Disp: , Rfl:    Coenzyme Q10 (COQ10 PO), Take by mouth., Disp: , Rfl:    docusate sodium (COLACE) 100 MG capsule, Take 100 mg by mouth daily as needed for mild constipation., Disp: , Rfl:    ezetimibe (ZETIA) 10 MG tablet, Take 10 mg by mouth at bedtime., Disp: , Rfl:    Fish Oil-Cholecalciferol (FISH OIL + D3 PO), Take by mouth., Disp: , Rfl:    fluorouracil (EFUDEX) 5 % cream, Apply 1 Application topically daily as needed (Itch)., Disp: , Rfl:    GNP IRON  200 (65 Fe) MG TABS, Take 200 mg by mouth at bedtime., Disp: , Rfl:    LINZESS 290 MCG CAPS capsule, TAKE 1 CAPSULE BY MOUTH DAILY BEFORE BREAKFAST., Disp: 90 capsule, Rfl: 0   metoprolol tartrate (LOPRESSOR) 25 MG tablet, Take 1 tablet (25 mg total) by mouth 2 (two) times daily., Disp: 180 tablet, Rfl: 3   Multiple Vitamin (MULTIVITAMIN) LIQD, Take 5 mLs by mouth daily., Disp: , Rfl:    nitroGLYCERIN (NITROSTAT) 0.4 MG SL tablet, Place 0.4 mg under the tongue every 5 (five) minutes as needed for chest pain., Disp: , Rfl:    omeprazole (PRILOSEC) 10 MG capsule, Take 10 mg by mouth at bedtime., Disp: , Rfl:    PEG-KCl-NaCl-NaSulf-Na Asc-C (PLENVU) 140 g SOLR, Take 1 kit by mouth as directed., Disp: 1 each, Rfl: 0   polyethylene glycol (MIRALAX / GLYCOLAX) 17 g packet, Take 17 g by mouth daily as needed for mild constipation., Disp: , Rfl:    potassium chloride (KLOR-CON) 10 MEQ tablet, Take 1 tablet (10 mEq total) by mouth daily., Disp: 90 tablet, Rfl: 3   senna (SENOKOT) 8.6 MG tablet, Take 1-2 tablets by mouth daily as needed for constipation., Disp: , Rfl:    sildenafil (VIAGRA) 100 MG tablet, Take 100 mg by mouth daily as needed for erectile dysfunction., Disp: , Rfl:  torsemide (DEMADEX) 20 MG tablet, Take 2 tablets (40 mg total) by mouth daily. (Patient taking differently: Take 20 mg by mouth daily.), Disp: 60 tablet, Rfl: 3   traMADol (ULTRAM) 50 MG tablet, Take 1 tablet (50 mg total) by mouth every 6 (six) hours as needed., Disp: 20 tablet, Rfl: 0  Past Medical History: Past Medical History:  Diagnosis Date   Allergy    Arthritis    Barrett's esophagus    CAD (coronary artery disease)    a. Stent to the RCA 1996 (in [place]);  b. ETT(11/13/13): abnormal with early ST changes to suggest ischemia;   c. LHC (11/17/13):  pLAD 30-40, D1 30, mLAD 70-75, mCFX 70, mRCA stent 40 (ISR), EF 55-65%.  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    Tobacco Use: Social History   Tobacco Use  Smoking Status Former   Current packs/day: 0.00   Average packs/day: 1 pack/day for 42.0 years (42.0 ttl pk-yrs)   Types: Cigarettes   Start date: 06/20/1969   Quit date: 06/21/2011   Years since quitting: 12.4  Smokeless Tobacco Former   Types: Chew    Labs: Review Flowsheet  More data exists      Latest Ref Rng & Units 01/24/2020 01/25/2020 04/23/2023 04/27/2023 05/25/2023  Labs for ITP Cardiac and Pulmonary Rehab  Hemoglobin A1c 4.8 - 5.6 % - - 5.2  - -  PH, Arterial 7.35 - 7.45 7.371  7.315  7.255  7.232  7.182  7.172  7.439  7.417  7.46  7.349  7.353  7.371  7.347  7.408  7.347  -  PCO2 arterial 32 - 48 mmHg 41.5  55.8  62.6  67.3  73.8  73.7  35.2  38.5  36  34.4  32.4  35.4  41.3  35.1  37.2  -  Bicarbonate 20.0 - 28.0 mmol/L 24.1  28.6  27.8  28.3  27.7  27.0  24.0  24.7  25.6  18.9  18.1  20.7  22.7  22.1  20.4  -  TCO2 22 - 32 mmol/L 25  30  30  30  30  29  25  26   - 20  19  22  24  24  26  25  24  23  24  22  23  28    Acid-base deficit 0.0 - 2.0 mmol/L 1.0  2.0  3.0  - - 6.0  7.0  4.0  3.0  2.0  5.0  -  O2 Saturation % 94.0  100.0  98.0  98.0  99.0  99.0  95.0  97.0  96.8  99  100  100  100  100  100  -    Details       Multiple values from one day are sorted in reverse-chronological order         Capillary Blood Glucose: Lab Results  Component Value Date   GLUCAP 160 (H) 05/01/2023   GLUCAP 113 (  H) 05/01/2023   GLUCAP 109 (H) 04/30/2023   GLUCAP 125 (H) 04/30/2023   GLUCAP 117 (H) 04/30/2023     Exercise Target Goals: Exercise Program Goal: Individual exercise prescription set  using results from initial 6 min walk test and THRR while considering  patient's activity barriers and safety.   Exercise Prescription Goal: Starting with aerobic activity 30 plus minutes a day, 3 days per week for initial exercise prescription. Provide home exercise prescription and guidelines that participant acknowledges understanding prior to discharge.  Activity Barriers & Risk Stratification:  Activity Barriers & Cardiac Risk Stratification - 10/12/23 1146       Activity Barriers & Cardiac Risk Stratification   Activity Barriers Deconditioning;Muscular Weakness;Shortness of Breath;Back Problems;Neck/Spine Problems;Joint Problems;Balance Concerns;Right Hip Replacement    Cardiac Risk Stratification High             6 Minute Walk:  6 Minute Walk     Row Name 10/12/23 0940         6 Minute Walk   Phase Initial     Distance 1080 feet     Walk Time 6 minutes     # of Rest Breaks 0     MPH 2.05     METS 2     RPE 11     Perceived Dyspnea  1     VO2 Peak 6.93     Symptoms Yes (comment)     Comments SOB, RPD =1,  right thigh pain, chronic 3/10     Resting HR 56 bpm     Resting BP 118/60     Resting Oxygen Saturation  96 %     Exercise Oxygen Saturation  during 6 min walk 90 %     Max Ex. HR 64 bpm     Max Ex. BP 130/72     2 Minute Post BP 132/70       Interval HR   1 Minute HR 57     2 Minute HR 60     3 Minute HR 61     4 Minute HR 61     5 Minute HR 62     6 Minute HR 63     2 Minute Post HR 62     Interval Heart Rate? Yes       Interval Oxygen   Interval Oxygen? Yes     Baseline Oxygen Saturation % 96 %     1 Minute Oxygen Saturation % 96 %     1 Minute Liters of Oxygen 0 L     2 Minute Oxygen Saturation % 94 %     2 Minute Liters of Oxygen 0 L     3 Minute Oxygen Saturation % 92 %     3 Minute Liters of Oxygen 0 L     4 Minute Oxygen Saturation % 92 %     4 Minute Liters of Oxygen 0 L     5 Minute Oxygen Saturation % 90 %     5 Minute Liters of  Oxygen 0 L     6 Minute Oxygen Saturation % 97 %     6 Minute Liters of Oxygen 0 L     2 Minute Post Oxygen Saturation % 100 %              Oxygen Initial Assessment:   Oxygen Re-Evaluation:   Oxygen Discharge (Final Oxygen Re-Evaluation):   Initial Exercise Prescription:  Initial Exercise Prescription - 10/12/23 1100  Date of Initial Exercise RX and Referring Provider   Date 10/12/23    Referring Provider Melany Guernsey    Expected Discharge Date 01/05/24      Recumbant Bike   Level 1    RPM 60    Watts 25    Minutes 15    METs 2      NuStep   Level 1    SPM 75    Minutes 15    METs 2      Prescription Details   Frequency (times per week) 3    Duration Progress to 30 minutes of continuous aerobic without signs/symptoms of physical distress      Intensity   THRR 40-80% of Max Heartrate 58-116    Ratings of Perceived Exertion 11-13    Perceived Dyspnea 0-4      Progression   Progression Continue progressive overload as per policy without signs/symptoms or physical distress.      Resistance Training   Training Prescription Yes    Weight 3 lbs    Reps 10-15             Perform Capillary Blood Glucose checks as needed.  Exercise Prescription Changes:   Exercise Prescription Changes     Row Name 10/18/23 [phone] 11/01/23 0822 11/08/23 1427         Response to Exercise   Blood Pressure (Admit) 140/70 116/62 120/60     Blood Pressure (Exercise) 124/64 100/50 128/60     Blood Pressure (Exit) 128/66 100/60 118/70     Heart Rate (Admit) 57 bpm 64 bpm 59 bpm     Heart Rate (Exercise) 78 bpm 84 bpm 92 bpm     Heart Rate (Exit) 64 bpm 66 bpm 67 bpm     Rating of Perceived Exertion (Exercise) 12 12 11      Perceived Dyspnea (Exercise) 0 0 0     Symptoms none none none     Comments Pt first day in the [employer] program Reviewed MET's, goals and home ExRx Reviewed MET's and goals     Duration Progress to 30 minutes of  aerobic without  signs/symptoms of physical distress Progress to 30 minutes of  aerobic without signs/symptoms of physical distress Progress to 30 minutes of  aerobic without signs/symptoms of physical distress     Intensity THRR unchanged THRR unchanged THRR unchanged       Progression   Progression Continue to progress workloads to maintain intensity without signs/symptoms of physical distress. Continue to progress workloads to maintain intensity without signs/symptoms of physical distress. Continue to progress workloads to maintain intensity without signs/symptoms of physical distress.     Average METs 2.5 3.35 3.35       Resistance Training   Training Prescription Yes Yes Yes     Weight 3 lbs 4 lbs wts 4 lbs wts     Reps 10-15 10-15 10-15     Time 10 Minutes 10 Minutes 10 Minutes       Recumbant Bike   Level 2 4 4      RPM 66 53 48     Watts 22 42 39     Minutes 15 15 15      METs 2.5 3.3 3.3       NuStep   Level 2 5 6      SPM -- 83 81     Minutes 15 15 15      METs -- 3.4 3.6       Home Exercise  Plan   Plans to continue exercise at -- [employer] (comment[facility]     Frequency -- Add 3 additional days to program exercise sessions. Add 3 additional days to program exercise sessions.     Initial Home Exercises Provided -- 11/01/23 11/01/23              Exercise Comments:   Exercise Comments     Row Name 10/18/23 0841 11/01/23 0832 11/08/23 1432       Exercise Comments Pt first day in the [facility] program. Pt tolerated exercise well with an average MET level of 2.5. He is off to a good start and is learning his THRR, REP and ExRx Reviewed MET's, goals and home ExRx. Pt tolerated exercise well with an average MET level of 3.35. Pt is doing well and increasing MET's, He feels good about his goals and is getting better control of his SOB and is increasing leg strength and stamina. Pt is already meeting ACSM guidlines and is going to [employer] and golfing 3  days for 30-60 mins. Reviewed MET's, and goals . Pt tolerated exercise well with an average MET level of 3.45. Pt is doing well breifly reviewed goals and he is feeling better already. He is feeling good with exercise on his own. Will give additional resourses on gradual weight lifting progression for his workouts at [employer]              Exercise Goals and Review:   Exercise Goals     Row Name 10/12/23 1145             Exercise Goals   Increase Physical Activity Yes       Intervention Provide advice, education, support and counseling about physical activity/exercise needs.;Develop an individualized exercise prescription for aerobic and resistive training based on initial evaluation findings, risk stratification, comorbidities and participant's personal goals.       Expected Outcomes Short Term: Attend rehab on a regular basis to increase amount of physical activity.;Long Term: Add in home exercise to make exercise part of routine and to increase amount of physical activity.;Long Term: Exercising regularly at least 3-5 days a week.       Increase Strength and Stamina Yes       Intervention Provide advice, education, support and counseling about physical activity/exercise needs.;Develop an individualized exercise prescription for aerobic and resistive training based on initial evaluation findings, risk stratification, comorbidities and participant's personal goals.       Expected Outcomes Short Term: Increase workloads from initial exercise prescription for resistance, speed, and METs.;Short Term: Perform resistance training exercises routinely during rehab and add in resistance training at home;Long Term: Improve cardiorespiratory fitness, muscular endurance and strength as measured by increased METs and functional capacity ( )       Able to understand and use rate of perceived exertion (RPE) scale Yes       Intervention Provide education and explanation on how to use RPE scale        Expected Outcomes Short Term: Able to use RPE daily in rehab to express subjective intensity level;Long Term:  Able to use RPE to guide intensity level when exercising independently       Able to understand and use Dyspnea scale Yes       Intervention Provide education and explanation on how to use Dyspnea scale       Expected Outcomes Short Term: Able to use Dyspnea scale daily in rehab to express subjective  sense of shortness of breath during exertion;Long Term: Able to use Dyspnea scale to guide intensity level when exercising independently       Knowledge and understanding of Target Heart Rate Range (THRR) Yes       Intervention Provide education and explanation of THRR including how the numbers were predicted and where they are located for reference       Expected Outcomes Short Term: Able to state/look up THRR;Long Term: Able to use THRR to govern intensity when exercising independently;Short Term: Able to use daily as guideline for intensity in rehab       Understanding of Exercise Prescription Yes       Intervention Provide education, explanation, and written materials on patient's individual exercise prescription       Expected Outcomes Short Term: Able to explain program exercise prescription;Long Term: Able to explain home exercise prescription to exercise independently                Exercise Goals Re-Evaluation :  Exercise Goals Re-Evaluation     Row Name 10/18/23 0835 11/01/23 0829 11/08/23 1430         Exercise Goal Re-Evaluation   Exercise Goals Review Increase Physical Activity;Understanding of Exercise Prescription;Increase Strength and Stamina;Knowledge and understanding of Target Heart Rate Range (THRR);Able to understand and use rate of perceived exertion (RPE) scale Increase Physical Activity;Understanding of Exercise Prescription;Increase Strength and Stamina;Knowledge and understanding of Target Heart Rate Range (THRR);Able to understand and use rate of perceived  exertion (RPE) scale Increase Physical Activity;Understanding of Exercise Prescription;Increase Strength and Stamina;Knowledge and understanding of Target Heart Rate Range (THRR);Able to understand and use rate of perceived exertion (RPE) scale     Comments Pt first day in the [facility] program. Pt tolerated exercise well with an average MET level of 2.5. He is off to a good start and is learning his THRR, REP and ExRx Reviewed MET's, goals and home ExRx. Pt tolerated exercise well with an average MEt level of 3.35. Pt is doing well and increasing MET's, He feels good about his goals and is getting better control of his SOB and is increasing leg strength and stamina. Pt is already meeting ACSM guidlines and is going to [employer] [facility] 3 days for 30-60 mins. Reviewed MET's, and goals . Pt tolerated exercise well with an average MET level of 3.45. Pt is doing well breifly reviewed goals and he is feeling better already. He is feeling good with exercise on his own. Will give additional resourses on gradual weight lifting progression for his workouts at [employer]     Expected Outcomes Will continue to monitor pt and progress workloads as tolerated without sign or symptom Will continue to monitor pt and progress workloads as tolerated without sign or symptom Will continue to monitor pt and progress workloads as tolerated without sign or symptom               Discharge Exercise Prescription (Final Exercise Prescription Changes):  Exercise Prescription Changes - 11/08/23 1427       Response to Exercise   Blood Pressure (Admit) 120/60    Blood Pressure (Exercise) 128/60    Blood Pressure (Exit) 118/70    Heart Rate (Admit) 59 bpm    Heart Rate (Exercise) 92 bpm    Heart Rate (Exit) 67 bpm    Rating of Perceived Exertion (Exercise) 11    Perceived Dyspnea (Exercise) 0    Symptoms none    Comments Reviewed MET's and  goals    Duration Progress to 30 minutes of  aerobic without  signs/symptoms of physical distress    Intensity THRR unchanged      Progression   Progression Continue to progress workloads to maintain intensity without signs/symptoms of physical distress.    Average METs 3.35      Resistance Training   Training Prescription Yes    Weight 4 lbs wts    Reps 10-15    Time 10 Minutes      Recumbant Bike   Level 4    RPM 48    Watts 39    Minutes 15    METs 3.3      NuStep   Level 6    SPM 81    Minutes 15    METs 3.6      Home Exercise Plan   Plans to continue exercise at [employer] (comment)    Frequency Add 3 additional days to program exercise sessions.    Initial Home Exercises Provided 11/01/23             Nutrition:  Target Goals: Understanding of nutrition guidelines, daily intake of sodium 1500mg , cholesterol 200mg , calories 30% from fat and 7% or less from saturated fats, daily to have 5 or more servings of fruits and vegetables.  Biometrics:  Pre Biometrics - 10/12/23 0910       Pre Biometrics   Waist Circumference 38 inches    Hip Circumference 40 inches    Waist to Hip Ratio 0.95 %    Triceps Skinfold 9 mm    % Body Fat 24.3 %    Grip Strength 38 kg    Flexibility 0 in    Single Leg Stand 4.6 seconds              Nutrition Therapy Plan and Nutrition Goals:  Nutrition Therapy & Goals - 10/18/23 1159       Nutrition Therapy   Diet Heart Healthy Diet    Drug/Food Interactions Statins/Certain Fruits      Personal Nutrition Goals   Nutrition Goal Patient to identify strategies for reducing cardiovascular risk by attending the [facility] education and nutrition series weekly.    Personal Goal #2 Patient to improve diet quality by using the plate method as a guide for meal planning to include lean protein/plant protein, fruits, vegetables, whole grains, nonfat dairy as part of a well-balanced diet.    Personal Goal #3 Patient to reduce sodium to 1500mg  per day.    Comments Daniel Colon reports no  nutrition concerns at this time. He has previously completed cardiac rehab in 2021. He has medical history of CABG x4, CAD, hyperlipidemia, HTN, Barrett's Esophagus, COPD, lung cancer. Per cardiology notes on 09/08/23, his LDL is <55 (53) and well controlled. Patient will benefit from participation in intensive cardiac rehab for nutrition, exercise, and lifestyle modification.      Intervention Plan   Intervention Nutrition handout(s) given to patient.;Prescribe, educate and counsel regarding individualized specific dietary modifications aiming towards targeted core components such as weight, hypertension, lipid management, diabetes, heart failure and other comorbidities.    Expected Outcomes Short Term Goal: Understand basic principles of dietary content, such as calories, fat, sodium, cholesterol and nutrients.;Long Term Goal: Adherence to prescribed nutrition plan.             Nutrition Assessments:  Nutrition Assessments - 10/21/23 0907       Rate Your Plate Scores   Pre Score 56  MEDIFICTS Score Key: >=70 Need to make dietary changes  40-70 Heart Healthy Diet <= 40 Therapeutic Level Cholesterol Diet  Flowsheet Row INTENSIVE CARDIAC REHAB from 10/20/2023 in [facility]  Picture Your Plate Total Score on Admission 56      Picture Your Plate Scores: <29 Unhealthy dietary pattern with much room for improvement. 41-50 Dietary pattern unlikely to meet recommendations for good health and room for improvement. 51-60 More healthful dietary pattern, with some room for improvement.  >60 Healthy dietary pattern, although there may be some specific behaviors that could be improved.    Nutrition Goals Re-Evaluation:  Nutrition Goals Re-Evaluation     Row Name 10/18/23 1159             Goals   Current Weight 171 lb 8.3 oz (77.8 kg)       Comment A1c WNL, lipoprotein a 170, LDL 53, HDL 47       Expected  Outcome Daniel Colon reports no nutrition concerns at this time. He has previously completed cardiac rehab in 2021. He has medical history of CABG x4, CAD, hyperlipidemia, HTN, Barrett's Esophagus, COPD, lung cancer. Per cardiology notes on 09/08/23, his LDL is <55 (53) and well controlled. Patient will benefit from participation in intensive cardiac rehab for nutrition, exercise, and lifestyle modification.                Nutrition Goals Discharge (Final Nutrition Goals Re-Evaluation):  Nutrition Goals Re-Evaluation - 10/18/23 1159       Goals   Current Weight 171 lb 8.3 oz (77.8 kg)    Comment A1c WNL, lipoprotein a 170, LDL 53, HDL 47    Expected Outcome Daniel Colon reports no nutrition concerns at this time. He has previously completed cardiac rehab in 2021. He has medical history of CABG x4, CAD, hyperlipidemia, HTN, Barrett's Esophagus, COPD, lung cancer. Per cardiology notes on 09/08/23, his LDL is <55 (53) and well controlled. Patient will benefit from participation in intensive cardiac rehab for nutrition, exercise, and lifestyle modification.             Psychosocial: Target Goals: Acknowledge presence or absence of significant depression and/or stress, maximize coping skills, provide positive support system. Participant is able to verbalize types and ability to use techniques and skills needed for reducing stress and depression.  Initial Review & Psychosocial Screening:  Initial Psych Review & Screening - 10/12/23 0857       Initial Review   Current issues with None Identified      Family Dynamics   Good Support System? Yes   Pt has spouse Daniel Colon for support     Barriers   Psychosocial barriers to participate in program There are no identifiable barriers or psychosocial needs.      Screening Interventions   Interventions Encouraged to exercise             Quality of Life Scores:  Quality of Life - 10/12/23 1141       Quality of Life   Select Quality of Life      Quality  of Life Scores   Health/Function Pre 18.83 %    Socioeconomic Pre 20.17 %    Psych/Spiritual Pre 22.86 %    Family Pre 27.05 %    GLOBAL Pre 21.17 %            Scores of 19 and below usually indicate a poorer quality of life in these areas.  A  difference of  2-3 points is a clinically meaningful difference.  A difference of 2-3 points in the total score of the Quality of Life Index has been associated with significant improvement in overall quality of life, self-image, physical symptoms, and general health in studies assessing change in quality of life.  PHQ-9: Review Flowsheet       10/12/2023 08/08/2020 06/05/2020  Depression screen PHQ 2/9  Decreased Interest 0 0 0  Down, Depressed, Hopeless 0 0 0  PHQ - 2 Score 0 0 0  Altered sleeping 1 0 0  Tired, decreased energy 1 0 0  Change in appetite 0 0 0  Feeling bad or failure about yourself  0 0 0  Trouble concentrating 0 0 0  Moving slowly or fidgety/restless 0 0 0  Suicidal thoughts 0 0 0  PHQ-9 Score 2 0 -  Difficult doing work/chores Not difficult at all Not difficult at all Not difficult at all    Details           Interpretation of Total Score  Total Score Depression Severity:  1-4 = Minimal depression, 5-9 = Mild depression, 10-14 = Moderate depression, 15-19 = Moderately severe depression, 20-27 = Severe depression   Psychosocial Evaluation and Intervention:   Psychosocial Re-Evaluation:  Psychosocial Re-Evaluation     Row Name 11/08/23 1732 11/23/23 1434           Psychosocial Re-Evaluation   Current issues with None Identified None Identified      Interventions Encouraged to attend Cardiac Rehabilitation for the exercise Encouraged to attend Cardiac Rehabilitation for the exercise      Continue Psychosocial Services  No Follow up required No Follow up required               Psychosocial Discharge (Final Psychosocial Re-Evaluation):  Psychosocial Re-Evaluation - 11/23/23 1434        Psychosocial Re-Evaluation   Current issues with None Identified    Interventions Encouraged to attend Cardiac Rehabilitation for the exercise    Continue Psychosocial Services  No Follow up required             Vocational Rehabilitation: Provide vocational rehab assistance to qualifying candidates.   Vocational Rehab Evaluation & Intervention:  Vocational Rehab - 10/12/23 0857       Initial Vocational Rehab Evaluation & Intervention   Assessment shows need for Vocational Rehabilitation No   Pt is retired            Education: Education Goals: Education classes will be provided on a weekly basis, covering required topics. Participant will state understanding/return demonstration of topics presented.  Learning Barriers/Preferences:  Learning Barriers/Preferences - 10/12/23 1142       Learning Barriers/Preferences   Learning Barriers None    Learning Preferences Audio;Computer/Internet;Group Instruction;Individual Instruction;Pictoral;Skilled Demonstration;Verbal Instruction;Video;Written Material             Education Topics: Hypertension, Hypertension Reduction -Define heart disease and high blood pressure. Discus how high blood pressure affects the body and ways to reduce high blood pressure.   Exercise and Your Heart -Discuss why it is important to exercise, the FITT principles of exercise, normal and abnormal responses to exercise, and how to exercise safely.   Angina -Discuss definition of angina, causes of angina, treatment of angina, and how to decrease risk of having angina.   Cardiac Medications -Review what the following cardiac medications are used for, how they affect the body, and side effects that may occur when taking the medications.  Medications include Aspirin, Beta blockers, calcium channel blockers, ACE Inhibitors, angiotensin receptor blockers, diuretics, digoxin, and antihyperlipidemics.   Congestive Heart Failure -Discuss the  definition of CHF, how to live with CHF, the signs and symptoms of CHF, and how keep track of weight and sodium intake.   Heart Disease and Intimacy -Discus the effect sexual activity has on the heart, how changes occur during intimacy as we age, and safety during sexual activity.   Smoking Cessation / COPD -Discuss different methods to quit smoking, the health benefits of quitting smoking, and the definition of COPD.   Nutrition I: Fats -Discuss the types of cholesterol, what cholesterol does to the heart, and how cholesterol levels can be controlled.   Nutrition II: Labels -Discuss the different components of food labels and how to read food label   Heart Parts/Heart Disease and PAD -Discuss the anatomy of the heart, the pathway of blood circulation through the heart, and these are affected by heart disease.   Stress I: Signs and Symptoms -Discuss the causes of stress, how stress may lead to anxiety and depression, and ways to limit stress.   Stress II: Relaxation -Discuss different types of relaxation techniques to limit stress.   Warning Signs of Stroke / TIA -Discuss definition of a stroke, what the signs and symptoms are of a stroke, and how to identify when someone is having stroke.   Knowledge Questionnaire Score:  Knowledge Questionnaire Score - 10/12/23 1141       Knowledge Questionnaire Score   Pre Score 19/24             Core Components/Risk Factors/Patient Goals at Admission:  Personal Goals and Risk Factors at Admission - 10/12/23 1142       Core Components/Risk Factors/Patient Goals on Admission   Improve shortness of breath with ADL's Yes    Intervention Provide education, individualized exercise plan and daily activity instruction to help decrease symptoms of SOB with activities of daily living.    Expected Outcomes Short Term: Improve cardiorespiratory fitness to achieve a reduction of symptoms when performing ADLs;Long Term: Be able to perform  more ADLs without symptoms or delay the onset of symptoms    Hypertension Yes    Intervention Provide education on lifestyle modifcations including regular physical activity/exercise, weight management, moderate sodium restriction and increased consumption of fresh fruit, vegetables, and low fat dairy, alcohol moderation, and smoking cessation.;Monitor prescription use compliance.    Expected Outcomes Short Term: Continued assessment and intervention until BP is < 140/51mm HG in hypertensive participants. < 130/31mm HG in hypertensive participants with diabetes, heart failure or chronic kidney disease.;Long Term: Maintenance of blood pressure at goal levels.    Lipids Yes    Intervention Provide education and support for participant on nutrition & aerobic/resistive exercise along with prescribed medications to achieve LDL 70mg , HDL >40mg .    Expected Outcomes Short Term: Participant states understanding of desired cholesterol values and is compliant with medications prescribed. Participant is following exercise prescription and nutrition guidelines.;Long Term: Cholesterol controlled with medications as prescribed, with individualized exercise RX and with personalized nutrition plan. Value goals: LDL < 70mg , HDL > 40 mg.             Core Components/Risk Factors/Patient Goals Review:   Goals and Risk Factor Review     Row Name 11/08/23 1733 11/23/23 1435           Core Components/Risk Factors/Patient Goals Review   Personal Goals Review Lipids;Hypertension;Improve shortness of breath with ADL's  Lipids;Hypertension;Improve shortness of breath with ADL's      Review Daniel Colon has been doing well with exercise. Vital signs have been stable Daniel Colon has been doing well with exercise. Vital signs have been stable. Daniel Colon is scheuled to have an inguinal hernia repair on 11/24/23/  laproscopic approach.      Expected Outcomes Daniel Colon will continue to particpate in cardiac rehab for exercise, nutrtion and lifestyle  modfications Daniel Colon will continue to particpate in cardiac rehab for exercise, nutrtion and lifestyle modfications               Core Components/Risk Factors/Patient Goals at Discharge (Final Review):   Goals and Risk Factor Review - 11/23/23 1435       Core Components/Risk Factors/Patient Goals Review   Personal Goals Review Lipids;Hypertension;Improve shortness of breath with ADL's    Review Daniel Colon has been doing well with exercise. Vital signs have been stable. Daniel Colon is scheuled to have an inguinal hernia repair on 11/24/23/  laproscopic approach.    Expected Outcomes Daniel Colon will continue to particpate in cardiac rehab for exercise, nutrtion and lifestyle modfications             ITP Comments:  ITP Comments     Row Name 10/12/23 1093 11/08/23 1730 11/23/23 1432       ITP Comments Armanda Magic, MD: Medical Director.  Introduction to the [facility] / Intensive Cardiac Rehab. Initial orientation packet reviewed with the patient. 30 Day ITP comments. Daniel Colon has good attendance and participation in cardiac rehab. 30 Day ITP comments. Daniel Colon has good attendance and participation in cardiac rehab. it is noted that Mr Ivery is scheduled to have Laproscopic ingunal hernia repair tomorrow.              Comments: See ITP comment

## 2023-12-01 ENCOUNTER — Inpatient Hospital Stay (HOSPITAL_COMMUNITY)
Admission: RE | Admit: 2023-12-01 | Discharge: 2023-12-01 | Discharge status: Home / Self Care | Payer: Medicare Other | Source: Ambulatory Visit | Attending: Cardiology

## 2023-12-01 ENCOUNTER — Encounter (HOSPITAL_COMMUNITY): Payer: Medicare Other

## 2023-12-01 ENCOUNTER — Telehealth: Payer: Self-pay | Admitting: *Deleted

## 2023-12-01 DIAGNOSIS — Z48812 Encounter for surgical aftercare following surgery on the circulatory system: Secondary | ICD-10-CM | POA: Diagnosis not present

## 2023-12-01 DIAGNOSIS — Z951 Presence of aortocoronary bypass graft: Secondary | ICD-10-CM | POA: Diagnosis not present

## 2023-12-01 DIAGNOSIS — M1812 Unilateral primary osteoarthritis of first carpometacarpal joint, left hand: Secondary | ICD-10-CM | POA: Diagnosis not present

## 2023-12-01 NOTE — Telephone Encounter (Signed)
   Pre-operative Risk Assessment    Patient Name: Daniel Colon  DOB: Oct 27, 1946 MRN: [id number]  DATE OF LAST VISIT: 11/15/23 TELE PREOP APPT JESSE CLEAVER, FNP DATE OF NEXT VISIT: NONE    Request for Surgical Clearance    Procedure:   LEFT THUMB CARPOMETACARPAL JOINT ARTHROPLASTY WITH TENDON TRANSFER  Date of Surgery:  Clearance TBD                                 Surgeon:  DR. Gomez Cleverly Surgeon's Group or Practice Name:  [facility] Phone number:  [phone] MEGAN DAVIS Fax number:  [phone]   Type of Clearance Requested:   - Medical  - Pharmacy:  Hold Aspirin     Type of Anesthesia:  Not Indicated ON CLEARANCE FORM   Additional requests/questions:    Elpidio Anis   12/01/2023, 5:51 PM

## 2023-12-02 ENCOUNTER — Telehealth: Payer: Self-pay | Admitting: *Deleted

## 2023-12-02 DIAGNOSIS — D692 Other nonthrombocytopenic purpura: Secondary | ICD-10-CM | POA: Diagnosis not present

## 2023-12-02 DIAGNOSIS — I8392 Asymptomatic varicose veins of left lower extremity: Secondary | ICD-10-CM | POA: Diagnosis not present

## 2023-12-02 DIAGNOSIS — L814 Other melanin hyperpigmentation: Secondary | ICD-10-CM | POA: Diagnosis not present

## 2023-12-02 DIAGNOSIS — Z85828 Personal history of other malignant neoplasm of skin: Secondary | ICD-10-CM | POA: Diagnosis not present

## 2023-12-02 DIAGNOSIS — L821 Other seborrheic keratosis: Secondary | ICD-10-CM | POA: Diagnosis not present

## 2023-12-02 DIAGNOSIS — D1801 Hemangioma of skin and subcutaneous tissue: Secondary | ICD-10-CM | POA: Diagnosis not present

## 2023-12-02 DIAGNOSIS — L570 Actinic keratosis: Secondary | ICD-10-CM | POA: Diagnosis not present

## 2023-12-02 NOTE — Telephone Encounter (Signed)
   Name: Daniel Colon  DOB: 1946-07-30  MRN: [id number]  Primary Cardiologist: Rollene Rotunda, MD   Preoperative team, please contact this patient and set up a phone call appointment for further preoperative risk assessment. Please obtain consent and complete medication review. Thank you for your help.  I confirm that guidance regarding antiplatelet and oral anticoagulation therapy has been completed and, if necessary, noted below.  Per office protocol, if patient is without any new symptoms or concerns at the time of their virtual visit, he/she may hold ASA for 7 days prior to procedure. Please resume ASA as soon as possible postprocedure, at the discretion of the surgeon.    I also confirmed the patient resides in the state of [place]. As per [facility] Medical Board telemedicine laws, the patient must reside in the state in which the provider is licensed.   Joni Reining, NP 12/02/2023, 7:56 AM [facility]

## 2023-12-02 NOTE — Telephone Encounter (Signed)
Pt has been scheduled tele pre op appt 12/13/23 for surgery trying to be done early Jan 2025. Med rec and consent are done.

## 2023-12-02 NOTE — Telephone Encounter (Signed)
Pt has been scheduled tele pre op appt 12/13/23 for surgery trying to be done early Jan 2025. Med rec and consent are done.     Patient Consent for Virtual Visit        Daniel Colon has provided verbal consent on 12/02/2023 for a virtual visit (video or telephone).   CONSENT FOR VIRTUAL VISIT FOR:  Daniel Colon  By participating in this virtual visit I agree to the following:  I hereby voluntarily request, consent and authorize [facility] and its employed or contracted physicians, physician assistants, nurse practitioners or other licensed health care professionals (the Practitioner), to provide me with telemedicine health care services (the "Services") as deemed necessary by the treating Practitioner. I acknowledge and consent to receive the Services by the Practitioner via telemedicine. I understand that the telemedicine visit will involve communicating with the Practitioner through live audiovisual communication technology and the disclosure of certain medical information by electronic transmission. I acknowledge that I have been given the opportunity to request an in-person assessment or other available alternative prior to the telemedicine visit and am voluntarily participating in the telemedicine visit.  I understand that I have the right to withhold or withdraw my consent to the use of telemedicine in the course of my care at any time, without affecting my right to future care or treatment, and that the Practitioner or I may terminate the telemedicine visit at any time. I understand that I have the right to inspect all information obtained and/or recorded in the course of the telemedicine visit and may receive copies of available information for a reasonable fee.  I understand that some of the potential risks of receiving the Services via telemedicine include:  Delay or interruption in medical evaluation due to technological equipment failure or disruption; Information  transmitted may not be sufficient (e.g. poor resolution of images) to allow for appropriate medical decision making by the Practitioner; and/or  In rare instances, security protocols could fail, causing a breach of personal health information.  Furthermore, I acknowledge that it is my responsibility to provide information about my medical history, conditions and care that is complete and accurate to the best of my ability. I acknowledge that Practitioner's advice, recommendations, and/or decision may be based on factors not within their control, such as incomplete or inaccurate data provided by me or distortions of diagnostic images or specimens that may result from electronic transmissions. I understand that the practice of medicine is not an exact science and that Practitioner makes no warranties or guarantees regarding treatment outcomes. I acknowledge that a copy of this consent can be made available to me via my patient portal [facility] MyChart), or I can request a printed copy by calling the office of  [facility].    I understand that my insurance will be billed for this visit.   I have read or had this consent read to me. I understand the contents of this consent, which adequately explains the benefits and risks of the Services being provided via telemedicine.  I have been provided ample opportunity to ask questions regarding this consent and the Services and have had my questions answered to my satisfaction. I give my informed consent for the services to be provided through the use of telemedicine in my medical care

## 2023-12-03 ENCOUNTER — Encounter (HOSPITAL_COMMUNITY): Payer: Medicare Other

## 2023-12-03 ENCOUNTER — Telehealth: Payer: Self-pay

## 2023-12-03 ENCOUNTER — Encounter (HOSPITAL_COMMUNITY)
Admission: RE | Admit: 2023-12-03 | Discharge: 2023-12-03 | Disposition: A | Discharge status: Home / Self Care | Payer: Medicare Other | Source: Ambulatory Visit | Attending: Cardiology | Admitting: Cardiology

## 2023-12-03 ENCOUNTER — Other Ambulatory Visit: Payer: Self-pay

## 2023-12-03 ENCOUNTER — Encounter: Payer: Self-pay | Admitting: Internal Medicine

## 2023-12-03 DIAGNOSIS — Z48812 Encounter for surgical aftercare following surgery on the circulatory system: Secondary | ICD-10-CM | POA: Diagnosis not present

## 2023-12-03 DIAGNOSIS — Z951 Presence of aortocoronary bypass graft: Secondary | ICD-10-CM

## 2023-12-03 MED ORDER — LINACLOTIDE 290 MCG PO CAPS: 290.0000 ug | ORAL_CAPSULE | Freq: Every day | ORAL | 3 refills | Status: DC

## 2023-12-03 NOTE — Telephone Encounter (Signed)
Linzess sent to [employer] [facility] for pt to take daily. Pts wife states it will need a PA.

## 2023-12-06 ENCOUNTER — Encounter (HOSPITAL_COMMUNITY)
Admission: RE | Admit: 2023-12-06 | Discharge: 2023-12-06 | Disposition: A | Discharge status: Home / Self Care | Payer: Medicare Other | Source: Ambulatory Visit | Attending: Cardiology | Admitting: Cardiology

## 2023-12-06 ENCOUNTER — Encounter (HOSPITAL_COMMUNITY): Payer: Medicare Other

## 2023-12-06 DIAGNOSIS — Z951 Presence of aortocoronary bypass graft: Secondary | ICD-10-CM | POA: Diagnosis not present

## 2023-12-06 DIAGNOSIS — Z48812 Encounter for surgical aftercare following surgery on the circulatory system: Secondary | ICD-10-CM | POA: Diagnosis not present

## 2023-12-08 ENCOUNTER — Encounter (HOSPITAL_COMMUNITY): Payer: Medicare Other

## 2023-12-08 ENCOUNTER — Encounter (HOSPITAL_COMMUNITY)
Admission: RE | Admit: 2023-12-08 | Discharge: 2023-12-08 | Disposition: A | Discharge status: Home / Self Care | Payer: Medicare Other | Source: Ambulatory Visit | Attending: Cardiology | Admitting: Cardiology

## 2023-12-08 DIAGNOSIS — Z48812 Encounter for surgical aftercare following surgery on the circulatory system: Secondary | ICD-10-CM | POA: Diagnosis not present

## 2023-12-08 DIAGNOSIS — Z951 Presence of aortocoronary bypass graft: Secondary | ICD-10-CM | POA: Diagnosis not present

## 2023-12-08 NOTE — Telephone Encounter (Signed)
Please see 11.27.24 encounter. This PA has already been approved

## 2023-12-08 NOTE — Telephone Encounter (Signed)
Patient notified via mychart

## 2023-12-10 ENCOUNTER — Encounter (HOSPITAL_COMMUNITY): Payer: Medicare Other

## 2023-12-10 ENCOUNTER — Encounter (HOSPITAL_COMMUNITY)
Admission: RE | Admit: 2023-12-10 | Discharge: 2023-12-10 | Disposition: A | Discharge status: Home / Self Care | Payer: Medicare Other | Source: Ambulatory Visit | Attending: Cardiology | Admitting: Cardiology

## 2023-12-10 DIAGNOSIS — Z951 Presence of aortocoronary bypass graft: Secondary | ICD-10-CM | POA: Diagnosis not present

## 2023-12-10 DIAGNOSIS — Z48812 Encounter for surgical aftercare following surgery on the circulatory system: Secondary | ICD-10-CM | POA: Diagnosis not present

## 2023-12-13 ENCOUNTER — Encounter (HOSPITAL_COMMUNITY)
Admission: RE | Admit: 2023-12-13 | Discharge: 2023-12-13 | Disposition: A | Discharge status: Home / Self Care | Payer: Medicare Other | Source: Ambulatory Visit | Attending: Cardiology | Admitting: Cardiology

## 2023-12-13 ENCOUNTER — Encounter (HOSPITAL_COMMUNITY): Payer: Medicare Other

## 2023-12-13 ENCOUNTER — Ambulatory Visit: Payer: Medicare Other | Attending: Cardiology | Admitting: Emergency Medicine

## 2023-12-13 DIAGNOSIS — Z951 Presence of aortocoronary bypass graft: Secondary | ICD-10-CM

## 2023-12-13 DIAGNOSIS — Z48812 Encounter for surgical aftercare following surgery on the circulatory system: Secondary | ICD-10-CM | POA: Diagnosis not present

## 2023-12-13 DIAGNOSIS — Z01810 Encounter for preprocedural cardiovascular examination: Secondary | ICD-10-CM | POA: Diagnosis not present

## 2023-12-13 NOTE — Progress Notes (Signed)
Virtual Visit via Telephone Note   Because of Daniel Colon co-morbid illnesses, he is at least at moderate risk for complications without adequate follow up.  This format is felt to be most appropriate for this patient at this time.  The patient did not have access to video technology/had technical difficulties with video requiring transitioning to audio format only (telephone).  All issues noted in this document were discussed and addressed.  No physical exam could be performed with this format.  Please refer to the patient's chart for his consent to telehealth for [facility].  Evaluation Performed:  Preoperative cardiovascular risk assessment _____________   Date:  12/13/2023   Patient ID:  Daniel Colon, DOB [birth year], MRN [id number] Patient Location:  Home Provider location:   Office  Primary Care Provider:  Garlan Fillers, MD Primary Cardiologist:  Rollene Rotunda, MD  Chief Complaint / Patient Profile   77 y.o. y/o male with a h/o CAD, s/p stent to RCA in 1996 in [place] and CABG x4 04/2023, non-small cell lung cancer status post status post robotic assisted right upper lobectomy by Dr. Tyrone Sage in 2021 & chemotherapy, 42-pack-year cigarette smoker having quit in 2012, hypertension, hyperlipidemia, diastolic heart failure.  who is pending left thumb carpometacarpal joint arthroplasty with tendon transfer by Dr. Gomez Cleverly at [facility] and presents today for telephonic preoperative cardiovascular risk assessment.  History of Present Illness    Daniel Colon is a 77 y.o. male who presents via audio/video conferencing for a telehealth visit today.  Pt was last seen in cardiology clinic on 09/08/2023 by Dr. Antoine Poche.  At that time Daniel Colon was doing well.  The patient is now pending procedure as outlined above. Since his last visit, he denies chest pain, lower extremity edema, fatigue, palpitations, melena, hematuria, hemoptysis, diaphoresis, weakness,  presyncope, syncope, orthopnea, and PND. He notes on-going chronic SOB that has improved with his cardiac rehab classes.    Past Medical History    Past Medical History:  Diagnosis Date   Allergy    Arthritis    Barrett's esophagus    CAD (coronary artery disease)    a. Stent to the RCA 1996 (in [place]);  b. ETT(11/13/13): abnormal with early ST changes to suggest ischemia;   c. LHC (11/17/13):  pLAD 30-40, D1 30, mLAD 70-75, mCFX 70, mRCA stent 40 (ISR), EF 55-65%.  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 art   BILROTH II PROCEDURE     Related to gastric ulcer   CARDIAC CATHETERIZATION     CIRCUMCISION     COLONOSCOPY     CORONARY ARTERY BYPASS GRAFT N/A 04/27/2023   Procedure: CORONARY ARTERY BYPASS GRAFTING (CABG) X 4, USING LEFT INTERNAL MAMMARY ARTERY AND RIGHT LEG GREATER SAPHENOUS VEIN HARVESTED ENDOSCOPICALLY;  Surgeon: Corliss Skains, MD;  Location: [facility] OR;  Service:  Open Heart Surgery;  Laterality: N/A;   CORONARY PRESSURE/FFR STUDY N/A 04/22/2023   Procedure: CORONARY PRESSURE/FFR STUDY;  Surgeon: Kathleene Hazel, MD;  Location: [facility] INVASIVE CV LAB;  Service: Cardiovascular;  Laterality: N/A;   INGUINAL HERNIA REPAIR Right 11/24/2023   Procedure: LAPAROSCOPIC RIGHT INGUINAL HERNIA REPAIR WITH MESH;  Surgeon: Axel Filler, MD;  Location: [facility] OR;  Service: General;  Laterality: Right;   INSERTION OF MESH Right 11/24/2023   Procedure: INSERTION OF MESH;  Surgeon: Axel Filler, MD;   Location: [facility] OR;  Service: General;  Laterality: Right;   INTERCOSTAL NERVE BLOCK Right 01/24/2020   Procedure: Intercostal Nerve Block;  Surgeon: Delight Ovens, MD;  Location: [facility] OR;  Service: Thoracic;  Laterality: Right;   KNEE ARTHROSCOPY Left    LEFT HEART CATH AND CORONARY ANGIOGRAPHY N/A 04/22/2023   Procedure: LEFT HEART CATH AND CORONARY ANGIOGRAPHY;  Surgeon: Kathleene Hazel, MD;  Location: [facility] INVASIVE CV LAB;  Service: Cardiovascular;  Laterality: N/A;   LEFT HEART CATHETERIZATION WITH CORONARY ANGIOGRAM N/A 11/17/2013   Procedure: LEFT HEART CATHETERIZATION WITH CORONARY ANGIOGRAM;  Surgeon: Micheline Chapman, MD;  Location: [facility] CATH LAB;  Service: Cardiovascular;  Laterality: N/A;   LUNG LOBECTOMY  01/2020   had chemo, no radtion   LYMPH NODE DISSECTION N/A 01/24/2020   Procedure: LYMPH NODE DISSECTION;  Surgeon: Delight Ovens, MD;  Location: [facility] OR;  Service: Thoracic;  Laterality: N/A;   NECK LESION BIOPSY     POLYPECTOMY     REFRACTIVE SURGERY     SKIN BIOPSY     Back   TEE WITHOUT CARDIOVERSION N/A 04/27/2023   Procedure: TRANSESOPHAGEAL ECHOCARDIOGRAM;  Surgeon: Corliss Skains, MD;  Location: [facility] OR;  Service: Open Heart Surgery;  Laterality: N/A;   Thigh surgery Left    Infant   TOTAL HIP ARTHROPLASTY Right 08/09/2023   Procedure: TOTAL HIP ARTHROPLASTY ANTERIOR APPROACH;  Surgeon: Ollen Gross, MD;  Location: [facility] ORS;  Service: Orthopedics;  Laterality: Right;   ulcer surgery     ULNAR NERVE TRANSPOSITION Right 08/05/2017   Procedure: RIGHT ULNAR NERVE DECOMPRESSION;  Surgeon: Betha Loa, MD;  Location: [facility];  Service: Orthopedics;  Laterality: Right;   UPPER GI ENDOSCOPY     VIDEO BRONCHOSCOPY N/A 01/24/2020   Procedure: VIDEO BRONCHOSCOPY;  Surgeon: Delight Ovens, MD;  Location: [facility] OR;  Service: Thoracic;  Laterality: N/A;   WISDOM TOOTH EXTRACTION      Allergies  No Known Allergies  Home Medications    Prior  to Admission medications   Medication Sig Start Date End Date Taking? Authorizing Provider  acetaminophen (TYLENOL) 650 MG CR tablet Take 1,300 mg by mouth every 8 (eight) hours as needed for pain (Mild pain).    [provider]  albuterol (VENTOLIN HFA) 108 (90 Base) MCG/ACT inhaler Inhale 2 puffs into the lungs daily as needed for wheezing or shortness of breath (Depend on activity level). 11/19/20   [provider]  ascorbic acid (VITAMIN C) 500 MG tablet Take 500 mg by mouth daily.    [provider]  aspirin EC 81 MG tablet Take 1 tablet (81 mg total) by mouth daily. Swallow whole. 10/14/23   Rollene Rotunda, MD  atorvastatin (LIPITOR) 40 MG tablet Take 1 tablet (40 mg total) by mouth at bedtime. 05/05/23   Rollene Rotunda, MD  bisacodyl (DULCOLAX) 5 MG EC tablet Take 5 mg by mouth daily as needed for moderate constipation.    [provider]  BREZTRI AEROSPHERE 160-9-4.8 MCG/ACT AERO Inhale 2 puffs into the lungs in the morning and at bedtime. 06/25/20   [provider]  Coenzyme Q10 (COQ10 PO) Take by mouth.    [provider]  docusate sodium (COLACE) 100 MG capsule Take 100 mg by mouth daily as needed for mild constipation.    [provider]  ezetimibe (ZETIA) 10 MG tablet Take 10 mg by mouth at bedtime.    [provider]  Fish Oil-Cholecalciferol (FISH OIL + D3 PO) Take by mouth.    [provider]  fluorouracil (EFUDEX) 5 % cream Apply 1 Application topically daily as needed (Itch). 11/04/22   [provider]  GNP IRON 200 (65 Fe) MG TABS Take 200 mg by mouth at bedtime. 04/27/22   [provider]  linaclotide Karlene Einstein) 290 MCG CAPS capsule Take 1 capsule (290 mcg total) by mouth daily before breakfast. 12/03/23   Pyrtle, Carie Caddy, MD  LINZESS 290 MCG CAPS capsule TAKE 1 CAPSULE BY MOUTH DAILY BEFORE BREAKFAST. 11/22/23   Pyrtle, Carie Caddy, MD  metoprolol tartrate (LOPRESSOR) 25 MG tablet Take 1 tablet  (25 mg total) by mouth 2 (two) times daily. 05/12/23   Rollene Rotunda, MD  Multiple Vitamin (MULTIVITAMIN) LIQD Take 5 mLs by mouth daily.    [provider]  nitroGLYCERIN (NITROSTAT) 0.4 MG SL tablet Place 0.4 mg under the tongue every 5 (five) minutes as needed for chest pain.    [provider]  omeprazole (PRILOSEC) 10 MG capsule Take 10 mg by mouth at bedtime.    [provider]  PEG-KCl-NaCl-NaSulf-Na Asc-C (PLENVU) 140 g SOLR Take 1 kit by mouth as directed. 11/16/23   Pyrtle, Carie Caddy, MD  polyethylene glycol (MIRALAX / GLYCOLAX) 17 g packet Take 17 g by mouth daily as needed for mild constipation.    [provider]  potassium chloride (KLOR-CON) 10 MEQ tablet Take 1 tablet (10 mEq total) by mouth daily. 11/22/23 02/20/24  Rollene Rotunda, MD  senna (SENOKOT) 8.6 MG tablet Take 1-2 tablets by mouth daily as needed for constipation.    [provider]  sildenafil (VIAGRA) 100 MG tablet Take 100 mg by mouth daily as needed for erectile dysfunction. 12/23/21   [provider]  torsemide (DEMADEX) 20 MG tablet Take 2 tablets (40 mg total) by mouth daily. Patient taking differently: Take 20 mg by mouth daily. 06/07/23 11/24/23  Rollene Rotunda, MD  traMADol (ULTRAM) 50 MG tablet Take 1 tablet (50 mg total) by mouth every 6 (six) hours as needed. 11/24/23 11/23/24  Axel Filler, MD    Physical Exam    Vital Signs:  [name] does not have vital signs available for review today.  Given telephonic nature of communication, physical exam is limited. AAOx3. NAD. Normal affect.  Speech and respirations are unlabored.  Accessory Clinical Findings    None  Assessment & Plan    1.  Preoperative Cardiovascular Risk Assessment:  According to the Revised Cardiac Risk Index (RCRI), his Perioperative Risk of Major Cardiac Event is (%): 6.6. His Functional Capacity in METs is: 5.81 according to the [facility] Activity Status Index (DASI). Therefore,  based on ACC/AHA guidelines, patient would be at acceptable risk for the planned procedure without further cardiovascular testing. I will route this recommendation to the requesting party via Epic fax function.  The patient was advised that if he develops new symptoms prior to surgery to contact our office to arrange for a  follow-up visit, and he verbalized understanding.  Per office protocol he may hold ASA for 7 days prior to procedure. Please resume ASA as soon as possible postprocedure, at the discretion of the surgeon.   A copy of this note will be routed to requesting surgeon.  Time:   Today, I have spent 9 minutes with the patient with telehealth technology discussing medical history, symptoms, and management plan.     Denyce Robert, NP  12/13/2023, 2:05 PM

## 2023-12-17 ENCOUNTER — Encounter (HOSPITAL_COMMUNITY)
Admission: RE | Admit: 2023-12-17 | Discharge: 2023-12-17 | Disposition: A | Discharge status: Home / Self Care | Payer: Medicare Other | Source: Ambulatory Visit | Attending: Cardiology | Admitting: Cardiology

## 2023-12-17 ENCOUNTER — Encounter (HOSPITAL_COMMUNITY): Payer: Medicare Other

## 2023-12-17 DIAGNOSIS — Z951 Presence of aortocoronary bypass graft: Secondary | ICD-10-CM

## 2023-12-17 DIAGNOSIS — Z48812 Encounter for surgical aftercare following surgery on the circulatory system: Secondary | ICD-10-CM | POA: Diagnosis not present

## 2023-12-20 ENCOUNTER — Encounter (HOSPITAL_COMMUNITY): Payer: Medicare Other

## 2023-12-20 ENCOUNTER — Encounter (HOSPITAL_COMMUNITY)
Admission: RE | Admit: 2023-12-20 | Discharge: 2023-12-20 | Disposition: A | Discharge status: Home / Self Care | Payer: Medicare Other | Source: Ambulatory Visit | Attending: Cardiology | Admitting: Cardiology

## 2023-12-20 DIAGNOSIS — Z48812 Encounter for surgical aftercare following surgery on the circulatory system: Secondary | ICD-10-CM | POA: Diagnosis not present

## 2023-12-20 DIAGNOSIS — Z951 Presence of aortocoronary bypass graft: Secondary | ICD-10-CM | POA: Diagnosis not present

## 2023-12-21 DIAGNOSIS — M1812 Unilateral primary osteoarthritis of first carpometacarpal joint, left hand: Secondary | ICD-10-CM | POA: Diagnosis not present

## 2023-12-21 DIAGNOSIS — G8918 Other acute postprocedural pain: Secondary | ICD-10-CM | POA: Diagnosis not present

## 2023-12-24 ENCOUNTER — Encounter (HOSPITAL_COMMUNITY): Payer: Medicare Other

## 2023-12-27 ENCOUNTER — Encounter (HOSPITAL_COMMUNITY): Admission: RE | Admit: 2023-12-27 | Payer: Medicare Other | Source: Ambulatory Visit

## 2023-12-27 ENCOUNTER — Encounter (HOSPITAL_COMMUNITY): Payer: Medicare Other

## 2023-12-29 ENCOUNTER — Encounter (HOSPITAL_COMMUNITY): Payer: Medicare Other

## 2023-12-29 DIAGNOSIS — M1812 Unilateral primary osteoarthritis of first carpometacarpal joint, left hand: Secondary | ICD-10-CM | POA: Diagnosis not present

## 2023-12-30 ENCOUNTER — Telehealth (HOSPITAL_COMMUNITY): Payer: Self-pay | Admitting: *Deleted

## 2023-12-30 NOTE — Telephone Encounter (Signed)
 Returned call from [facility]. Discussed return to cardiac rehab will likely be January 20th. Daniel Colon understands to bring clearance letter from orthopedic surgeon prior to or on the 20th in order to resume exercise at cardiac rehab.

## 2023-12-31 ENCOUNTER — Encounter (HOSPITAL_COMMUNITY): Payer: Medicare Other

## 2024-01-03 ENCOUNTER — Encounter (HOSPITAL_COMMUNITY): Payer: Medicare Other

## 2024-01-05 ENCOUNTER — Encounter (HOSPITAL_COMMUNITY): Payer: Medicare Other

## 2024-01-06 DIAGNOSIS — M1812 Unilateral primary osteoarthritis of first carpometacarpal joint, left hand: Secondary | ICD-10-CM | POA: Diagnosis not present

## 2024-01-10 ENCOUNTER — Encounter (HOSPITAL_COMMUNITY)
Admission: RE | Admit: 2024-01-10 | Discharge: 2024-01-10 | Disposition: A | Discharge status: Home / Self Care | Payer: Medicare Other | Source: Ambulatory Visit | Attending: Cardiology | Admitting: Cardiology

## 2024-01-10 DIAGNOSIS — Z951 Presence of aortocoronary bypass graft: Secondary | ICD-10-CM | POA: Diagnosis not present

## 2024-01-12 ENCOUNTER — Encounter (HOSPITAL_COMMUNITY)
Admission: RE | Admit: 2024-01-12 | Discharge: 2024-01-12 | Disposition: A | Discharge status: Home / Self Care | Payer: Medicare Other | Source: Ambulatory Visit | Attending: Cardiology | Admitting: Cardiology

## 2024-01-12 DIAGNOSIS — Z951 Presence of aortocoronary bypass graft: Secondary | ICD-10-CM | POA: Diagnosis not present

## 2024-01-14 ENCOUNTER — Encounter (HOSPITAL_COMMUNITY)
Admission: RE | Admit: 2024-01-14 | Discharge: 2024-01-14 | Disposition: A | Discharge status: Home / Self Care | Payer: Medicare Other | Source: Ambulatory Visit | Attending: Cardiology | Admitting: Cardiology

## 2024-01-14 DIAGNOSIS — Z951 Presence of aortocoronary bypass graft: Secondary | ICD-10-CM

## 2024-01-14 DIAGNOSIS — M25642 Stiffness of left hand, not elsewhere classified: Secondary | ICD-10-CM | POA: Diagnosis not present

## 2024-01-17 ENCOUNTER — Encounter (HOSPITAL_COMMUNITY)
Admission: RE | Admit: 2024-01-17 | Discharge: 2024-01-17 | Disposition: A | Discharge status: Home / Self Care | Payer: Medicare Other | Source: Ambulatory Visit | Attending: Cardiology | Admitting: Cardiology

## 2024-01-17 DIAGNOSIS — Z951 Presence of aortocoronary bypass graft: Secondary | ICD-10-CM | POA: Diagnosis not present

## 2024-01-19 ENCOUNTER — Encounter (HOSPITAL_COMMUNITY)
Admission: RE | Admit: 2024-01-19 | Discharge: 2024-01-19 | Disposition: A | Discharge status: Home / Self Care | Payer: Medicare Other | Source: Ambulatory Visit | Attending: Cardiology | Admitting: Cardiology

## 2024-01-19 DIAGNOSIS — Z951 Presence of aortocoronary bypass graft: Secondary | ICD-10-CM

## 2024-01-19 NOTE — Progress Notes (Signed)
Cardiac Individual Treatment Plan  Patient Details  Name: Daniel Colon MRN: [id number] Date of Birth: 09-Oct-1946 Referring Provider:   Flowsheet Row INTENSIVE CARDIAC REHAB ORIENT from 10/12/2023 in [facility]  Referring Provider Melany Guernsey       Initial Encounter Date:  Flowsheet Row INTENSIVE CARDIAC REHAB ORIENT from 10/12/2023 in [facility]  Date 10/12/23       Visit Diagnosis: 04/27/23 S/P CABG x 4  Patient's Home Medications on Admission:  Current Outpatient Medications:    acetaminophen (TYLENOL) 650 MG CR tablet, Take 1,300 mg by mouth every 8 (eight) hours as needed for pain (Mild pain)., Disp: , Rfl:    albuterol (VENTOLIN HFA) 108 (90 Base) MCG/ACT inhaler, Inhale 2 puffs into the lungs daily as needed for wheezing or shortness of breath (Depend on activity level)., Disp: , Rfl:    ascorbic acid (VITAMIN C) 500 MG tablet, Take 500 mg by mouth daily., Disp: , Rfl:    aspirin EC 81 MG tablet, Take 1 tablet (81 mg total) by mouth daily. Swallow whole., Disp: , Rfl:    atorvastatin (LIPITOR) 40 MG tablet, Take 1 tablet (40 mg total) by mouth at bedtime., Disp: 90 tablet, Rfl: 3   bisacodyl (DULCOLAX) 5 MG EC tablet, Take 5 mg by mouth daily as needed for moderate constipation., Disp: , Rfl:    BREZTRI AEROSPHERE 160-9-4.8 MCG/ACT AERO, Inhale 2 puffs into the lungs in the morning and at bedtime., Disp: , Rfl:    Coenzyme Q10 (COQ10 PO), Take by mouth., Disp: , Rfl:    docusate sodium (COLACE) 100 MG capsule, Take 100 mg by mouth daily as needed for mild constipation., Disp: , Rfl:    ezetimibe (ZETIA) 10 MG tablet, Take 10 mg by mouth at bedtime., Disp: , Rfl:    Fish Oil-Cholecalciferol (FISH OIL + D3 PO), Take by mouth., Disp: , Rfl:    fluorouracil (EFUDEX) 5 % cream, Apply 1 Application topically daily as needed (Itch)., Disp: , Rfl:    GNP IRON  200 (65 Fe) MG TABS, Take 200 mg by mouth at bedtime., Disp: , Rfl:    linaclotide (LINZESS) 290 MCG CAPS capsule, Take 1 capsule (290 mcg total) by mouth daily before breakfast., Disp: 90 capsule, Rfl: 3   LINZESS 290 MCG CAPS capsule, TAKE 1 CAPSULE BY MOUTH DAILY BEFORE BREAKFAST., Disp: 90 capsule, Rfl: 0   metoprolol tartrate (LOPRESSOR) 25 MG tablet, Take 1 tablet (25 mg total) by mouth 2 (two) times daily., Disp: 180 tablet, Rfl: 3   Multiple Vitamin (MULTIVITAMIN) LIQD, Take 5 mLs by mouth daily., Disp: , Rfl:    nitroGLYCERIN (NITROSTAT) 0.4 MG SL tablet, Place 0.4 mg under the tongue every 5 (five) minutes as needed for chest pain., Disp: , Rfl:    omeprazole (PRILOSEC) 10 MG capsule, Take 10 mg by mouth at bedtime., Disp: , Rfl:    PEG-KCl-NaCl-NaSulf-Na Asc-C (PLENVU) 140 g SOLR, Take 1 kit by mouth as directed., Disp: 1 each, Rfl: 0   polyethylene glycol (MIRALAX / GLYCOLAX) 17 g packet, Take 17 g by mouth daily as needed for mild constipation., Disp: , Rfl:    potassium chloride (KLOR-CON) 10 MEQ tablet, Take 1 tablet (10 mEq total) by mouth daily., Disp: 90 tablet, Rfl: 3   senna (SENOKOT) 8.6 MG tablet, Take 1-2 tablets by mouth daily as needed for constipation., Disp: , Rfl:  sildenafil (VIAGRA) 100 MG tablet, Take 100 mg by mouth daily as needed for erectile dysfunction., Disp: , Rfl:    torsemide (DEMADEX) 20 MG tablet, Take 2 tablets (40 mg total) by mouth daily. (Patient taking differently: Take 20 mg by mouth daily.), Disp: 60 tablet, Rfl: 3   traMADol (ULTRAM) 50 MG tablet, Take 1 tablet (50 mg total) by mouth every 6 (six) hours as needed., Disp: 20 tablet, Rfl: 0  Past Medical History: Past Medical History:  Diagnosis Date   Allergy    Arthritis    Barrett's esophagus    CAD (coronary artery disease)    a. Stent to the RCA 1996 (in [place]);  b. ETT(11/13/13): abnormal with early ST changes to suggest ischemia;   c. LHC (11/17/13):  pLAD 30-40, D1 30, mLAD 70-75, mCFX  70, mRCA stent 40 (ISR), EF 55-65%.  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    Tobacco Use: Social History   Tobacco Use  Smoking Status Former   Current packs/day: 0.00   Average packs/day: 1 pack/day for 42.0 years (42.0 ttl pk-yrs)   Types: Cigarettes   Start date: 06/20/1969   Quit date: 06/21/2011   Years since quitting: 12.6  Smokeless Tobacco Former   Types: Chew    Labs: Review Flowsheet  More data exists      Latest Ref Rng & Units 01/24/2020 01/25/2020 04/23/2023 04/27/2023 05/25/2023  Labs for ITP Cardiac and Pulmonary Rehab  Hemoglobin A1c 4.8 - 5.6 % - - 5.2  - -  PH, Arterial 7.35 - 7.45 7.371  7.315  7.255  7.232  7.182  7.172  7.439  7.417  7.46  7.349  7.353  7.371  7.347  7.408  7.347  -  PCO2 arterial 32 - 48 mmHg 41.5  55.8  62.6  67.3  73.8  73.7  35.2  38.5  36  34.4  32.4  35.4  41.3  35.1  37.2  -  Bicarbonate 20.0 - 28.0 mmol/L 24.1  28.6  27.8  28.3  27.7  27.0  24.0  24.7  25.6  18.9  18.1  20.7  22.7  22.1  20.4  -  TCO2 22 - 32 mmol/L 25  30  30  30  30  29  25  26   - 20  19  22  24  24  26  25  24  23  24  22  23  28    Acid-base deficit 0.0 - 2.0 mmol/L 1.0  2.0  3.0  - - 6.0  7.0  4.0  3.0  2.0  5.0  -  O2 Saturation % 94.0  100.0  98.0  98.0  99.0  99.0  95.0  97.0  96.8  99  100  100  100  100  100  -    Details       Multiple values from one day are sorted in reverse-chronological order  Capillary Blood Glucose: Lab Results  Component Value Date   GLUCAP 160 (H) 05/01/2023   GLUCAP 113 (H) 05/01/2023   GLUCAP 109 (H) 04/30/2023   GLUCAP 125  (H) 04/30/2023   GLUCAP 117 (H) 04/30/2023     Exercise Target Goals: Exercise Program Goal: Individual exercise prescription set using results from initial 6 min walk test and THRR while considering  patient's activity barriers and safety.   Exercise Prescription Goal: Initial exercise prescription builds to 30-45 minutes a day of aerobic activity, 2-3 days per week.  Home exercise guidelines will be given to patient during program as part of exercise prescription that the participant will acknowledge.  Activity Barriers & Risk Stratification:  Activity Barriers & Cardiac Risk Stratification - 10/12/23 1146       Activity Barriers & Cardiac Risk Stratification   Activity Barriers Deconditioning;Muscular Weakness;Shortness of Breath;Back Problems;Neck/Spine Problems;Joint Problems;Balance Concerns;Right Hip Replacement    Cardiac Risk Stratification High             6 Minute Walk:  6 Minute Walk     Row Name 10/12/23 0940         6 Minute Walk   Phase Initial     Distance 1080 feet     Walk Time 6 minutes     # of Rest Breaks 0     MPH 2.05     METS 2     RPE 11     Perceived Dyspnea  1     VO2 Peak 6.93     Symptoms Yes (comment)     Comments SOB, RPD =1,  right thigh pain, chronic 3/10     Resting HR 56 bpm     Resting BP 118/60     Resting Oxygen Saturation  96 %     Exercise Oxygen Saturation  during 6 min walk 90 %     Max Ex. HR 64 bpm     Max Ex. BP 130/72     2 Minute Post BP 132/70       Interval HR   1 Minute HR 57     2 Minute HR 60     3 Minute HR 61     4 Minute HR 61     5 Minute HR 62     6 Minute HR 63     2 Minute Post HR 62     Interval Heart Rate? Yes       Interval Oxygen   Interval Oxygen? Yes     Baseline Oxygen Saturation % 96 %     1 Minute Oxygen Saturation % 96 %     1 Minute Liters of Oxygen 0 L     2 Minute Oxygen Saturation % 94 %     2 Minute Liters of Oxygen 0 L     3 Minute Oxygen Saturation % 92 %     3 Minute  Liters of Oxygen 0 L     4 Minute Oxygen Saturation % 92 %     4 Minute Liters of Oxygen 0 L     5 Minute Oxygen Saturation % 90 %     5 Minute Liters of Oxygen 0 L     6 Minute Oxygen Saturation % 97 %     6 Minute Liters of Oxygen 0 L     2 Minute Post Oxygen Saturation % 100 %  Oxygen Initial Assessment:   Oxygen Re-Evaluation:   Oxygen Discharge (Final Oxygen Re-Evaluation):   Initial Exercise Prescription:  Initial Exercise Prescription - 10/12/23 1100       Date of Initial Exercise RX and Referring Provider   Date 10/12/23    Referring Provider Melany Guernsey    Expected Discharge Date 01/05/24      Recumbant Bike   Level 1    RPM 60    Watts 25    Minutes 15    METs 2      NuStep   Level 1    SPM 75    Minutes 15    METs 2      Prescription Details   Frequency (times per week) 3    Duration Progress to 30 minutes of continuous aerobic without signs/symptoms of physical distress      Intensity   THRR 40-80% of Max Heartrate 58-116    Ratings of Perceived Exertion 11-13    Perceived Dyspnea 0-4      Progression   Progression Continue progressive overload as per policy without signs/symptoms or physical distress.      Resistance Training   Training Prescription Yes    Weight 3 lbs    Reps 10-15             Perform Capillary Blood Glucose checks as needed.  Exercise Prescription Changes:   Exercise Prescription Changes     Row Name 10/18/23 0828 11/01/23 0822 11/08/23 1427 12/08/23 0812 01/12/24 0831     Response to Exercise   Blood Pressure (Admit) 140/70 116/62 120/60 140/70 142/76   Blood Pressure (Exercise) 124/64 100/50 128/60 -- 120/72   Blood Pressure (Exit) 128/66 100/60 118/70 120/62 120/62   Heart Rate (Admit) 57 bpm 64 bpm 59 bpm 74 bpm 67 bpm   Heart Rate (Exercise) 78 bpm 84 bpm 92 bpm 96 bpm 95 bpm   Heart Rate (Exit) 64 bpm 66 bpm 67 bpm 77 bpm 70 bpm   Rating of Perceived Exertion (Exercise) 12 12 11   11.5 11.5   Perceived Dyspnea (Exercise) 0 0 0 0 0   Symptoms none none none none none   Comments Pt first day in the [employer] program Reviewed MET's, goals and home ExRx Reviewed MET's and goals Reviewed MET's and goals Reviewed MET's   Duration Progress to 30 minutes of  aerobic without signs/symptoms of physical distress Progress to 30 minutes of  aerobic without signs/symptoms of physical distress Progress to 30 minutes of  aerobic without signs/symptoms of physical distress Progress to 30 minutes of  aerobic without signs/symptoms of physical distress Progress to 30 minutes of  aerobic without signs/symptoms of physical distress   Intensity THRR unchanged THRR unchanged THRR unchanged THRR unchanged THRR unchanged     Progression   Progression Continue to progress workloads to maintain intensity without signs/symptoms of physical distress. Continue to progress workloads to maintain intensity without signs/symptoms of physical distress. Continue to progress workloads to maintain intensity without signs/symptoms of physical distress. Continue to progress workloads to maintain intensity without signs/symptoms of physical distress. Continue to progress workloads to maintain intensity without signs/symptoms of physical distress.   Average METs 2.5 3.35 3.35 4.4 4.1     Resistance Training   Training Prescription Yes Yes Yes Yes No   Weight 3 lbs 4 lbs wts 4 lbs wts 4 lbs wts 4 lbs wts   Reps 10-15 10-15 10-15 10-15 10-15   Time 10 Minutes 10 Minutes 10  Minutes 10 Minutes 10 Minutes     Treadmill   MPH -- -- -- -- 3.2   Grade -- -- -- -- 1   Minutes -- -- -- -- 15   METs -- -- -- -- 3.89     Recumbant Bike   Level 2 4 4 4  --   RPM 66 53 48 -- --   Watts 22 42 39 -- --   Minutes 15 15 15 15  --   METs 2.5 3.3 3.3 4.3 --     NuStep   Level 2 5 6 6 6    SPM -- 83 81 86 85   Minutes 15 15 15 15 15    METs -- 3.4 3.6 4.5 4.3     Home Exercise Plan   Plans to continue exercise at --  [employer] (comment) [employer] (comment) [employer] (comment) [employer] (comment)   Frequency -- Add 3 additional days to program exercise sessions. Add 3 additional days to program exercise sessions. Add 3 additional days to program exercise sessions. Add 3 additional days to program exercise sessions.   Initial Home Exercises Provided -- 11/01/23 11/01/23 11/01/23 11/01/23            Exercise Comments:   Exercise Comments     Row Name 10/18/23 0841 11/01/23 0832 11/08/23 1432 12/08/23 0817 12/27/23 0817   Exercise Comments Pt first day in the [facility] program. Pt tolerated exercise well with an average MET level of 2.5. He is off to a good start and is learning his THRR, REP and ExRx Reviewed MET's, goals and home ExRx. Pt tolerated exercise well with an average MET level of 3.35. Pt is doing well and increasing MET's, He feels good about his goals and is getting better control of his SOB and is increasing leg strength and stamina. Pt is already meeting ACSM guidlines and is going to [employer] and golfing 3 days for 30-60 mins. Reviewed MET's, and goals . Pt tolerated exercise well with an average MET level of 3.45. Pt is doing well breifly reviewed goals and he is feeling better already. He is feeling good with exercise on his own. Will give additional resourses on gradual weight lifting progression for his workouts at [employer] Reviewed MET's and goals. Pt tolerated exercise well with an average MET level of 4.4. Pt feels good about his goals and is increasing MET's. He feels in better control of his SOB and is increasing his SaO2. He is not yet cleared for retruning to golf, but is excited about returning soon. Patient absent due to hand surgery, will review education when he returns    Row Name 01/12/24 0834           Exercise Comments Reviewed MET's. Patient is doing well since returning from hand surgery. We changed some equipment up and  he feels good with the changes. Will work on progression as he gets comfortable. Avg MET level of 4.1 today.                Exercise Goals and Review:   Exercise Goals     Row Name 10/12/23 1145             Exercise Goals   Increase Physical Activity Yes       Intervention Provide advice, education, support and counseling about physical activity/exercise needs.;Develop an individualized exercise prescription for aerobic and resistive training based on initial evaluation findings, risk stratification, comorbidities and participant's personal goals.  Expected Outcomes Short Term: Attend rehab on a regular basis to increase amount of physical activity.;Long Term: Add in home exercise to make exercise part of routine and to increase amount of physical activity.;Long Term: Exercising regularly at least 3-5 days a week.       Increase Strength and Stamina Yes       Intervention Provide advice, education, support and counseling about physical activity/exercise needs.;Develop an individualized exercise prescription for aerobic and resistive training based on initial evaluation findings, risk stratification, comorbidities and participant's personal goals.       Expected Outcomes Short Term: Increase workloads from initial exercise prescription for resistance, speed, and METs.;Short Term: Perform resistance training exercises routinely during rehab and add in resistance training at home;Long Term: Improve cardiorespiratory fitness, muscular endurance and strength as measured by increased METs and functional capacity ( )       Able to understand and use rate of perceived exertion (RPE) scale Yes       Intervention Provide education and explanation on how to use RPE scale       Expected Outcomes Short Term: Able to use RPE daily in rehab to express subjective intensity level;Long Term:  Able to use RPE to guide intensity level when exercising independently       Able to understand and use  Dyspnea scale Yes       Intervention Provide education and explanation on how to use Dyspnea scale       Expected Outcomes Short Term: Able to use Dyspnea scale daily in rehab to express subjective sense of shortness of breath during exertion;Long Term: Able to use Dyspnea scale to guide intensity level when exercising independently       Knowledge and understanding of Target Heart Rate Range (THRR) Yes       Intervention Provide education and explanation of THRR including how the numbers were predicted and where they are located for reference       Expected Outcomes Short Term: Able to state/look up THRR;Long Term: Able to use THRR to govern intensity when exercising independently;Short Term: Able to use daily as guideline for intensity in rehab       Understanding of Exercise Prescription Yes       Intervention Provide education, explanation, and written materials on patient's individual exercise prescription       Expected Outcomes Short Term: Able to explain program exercise prescription;Long Term: Able to explain home exercise prescription to exercise independently                Exercise Goals Re-Evaluation :  Exercise Goals Re-Evaluation     Row Name 10/18/23 0835 11/01/23 0829 11/08/23 1430 12/08/23 0814       Exercise Goal Re-Evaluation   Exercise Goals Review Increase Physical Activity;Understanding of Exercise Prescription;Increase Strength and Stamina;Knowledge and understanding of Target Heart Rate Range (THRR);Able to understand and use rate of perceived exertion (RPE) scale Increase Physical Activity;Understanding of Exercise Prescription;Increase Strength and Stamina;Knowledge and understanding of Target Heart Rate Range (THRR);Able to understand and use rate of perceived exertion (RPE) scale Increase Physical Activity;Understanding of Exercise Prescription;Increase Strength and Stamina;Knowledge and understanding of Target Heart Rate Range (THRR);Able to understand and use  rate of perceived exertion (RPE) scale Increase Physical Activity;Understanding of Exercise Prescription;Increase Strength and Stamina;Knowledge and understanding of Target Heart Rate Range (THRR);Able to understand and use rate of perceived exertion (RPE) scale    Comments Pt first day in the [facility] program. Pt tolerated exercise well with an  average MET level of 2.5. He is off to a good start and is learning his THRR, REP and ExRx Reviewed MET's, goals and home ExRx. Pt tolerated exercise well with an average MEt level of 3.35. Pt is doing well and increasing MET's, He feels good about his goals and is getting better control of his SOB and is increasing leg strength and stamina. Pt is already meeting ACSM guidlines and is going to [employer] and golfing 3 days for 30-60 mins. Reviewed MET's, and goals . Pt tolerated exercise well with an average MET level of 3.45. Pt is doing well breifly reviewed goals and he is feeling better already. He is feeling good with exercise on his own. Will give additional resourses on gradual weight lifting progression for his workouts at [employer] Reviewed MET's and goals. Pt tolerated exercise well with an average MET level of 4.4. Pt feels good about his goals and is increasing MET's. He feels in better control of his SOB and is increasing his SaO2. He is not yet cleared for retruning to golf, but is excited about returning soon.    Expected Outcomes Will continue to monitor pt and progress workloads as tolerated without sign or symptom Will continue to monitor pt and progress workloads as tolerated without sign or symptom Will continue to monitor pt and progress workloads as tolerated without sign or symptom Will continue to monitor pt and progress workloads as tolerated without sign or symptom             Discharge Exercise Prescription (Final Exercise Prescription Changes):  Exercise Prescription Changes - 01/12/24 0831       Response to Exercise    Blood Pressure (Admit) 142/76    Blood Pressure (Exercise) 120/72    Blood Pressure (Exit) 120/62    Heart Rate (Admit) 67 bpm    Heart Rate (Exercise) 95 bpm    Heart Rate (Exit) 70 bpm    Rating of Perceived Exertion (Exercise) 11.5    Perceived Dyspnea (Exercise) 0    Symptoms none    Comments Reviewed MET's    Duration Progress to 30 minutes of  aerobic without signs/symptoms of physical distress    Intensity THRR unchanged      Progression   Progression Continue to progress workloads to maintain intensity without signs/symptoms of physical distress.    Average METs 4.1      Resistance Training   Training Prescription No    Weight 4 lbs wts    Reps 10-15    Time 10 Minutes      Treadmill   MPH 3.2    Grade 1    Minutes 15    METs 3.89      NuStep   Level 6    SPM 85    Minutes 15    METs 4.3      Home Exercise Plan   Plans to continue exercise at [facility] (comment)    Frequency Add 3 additional days to program exercise sessions.    Initial Home Exercises Provided 11/01/23             Nutrition:  Target Goals: Understanding of nutrition guidelines, daily intake of sodium 1500mg , cholesterol 200mg , calories 30% from fat and 7% or less from saturated fats, daily to have 5 or more servings of fruits and vegetables.  Biometrics:  Pre Biometrics - 10/12/23 0910       Pre Biometrics   Waist Circumference 38 inches    Hip  Circumference 40 inches    Waist to Hip Ratio 0.95 %    Triceps Skinfold 9 mm    % Body Fat 24.3 %    Grip Strength 38 kg    Flexibility 0 in    Single Leg Stand 4.6 seconds              Nutrition Therapy Plan and Nutrition Goals:  Nutrition Therapy & Goals - 10/18/23 1159       Nutrition Therapy   Diet Heart Healthy Diet    Drug/Food Interactions Statins/Certain Fruits      Personal Nutrition Goals   Nutrition Goal Patient to identify strategies for reducing cardiovascular risk by attending the [facility]  education and nutrition series weekly.    Personal Goal #2 Patient to improve diet quality by using the plate method as a guide for meal planning to include lean protein/plant protein, fruits, vegetables, whole grains, nonfat dairy as part of a well-balanced diet.    Personal Goal #3 Patient to reduce sodium to 1500mg  per day.    Comments Daniel Colon reports no nutrition concerns at this time. He has previously completed cardiac rehab in 2021. He has medical history of CABG x4, CAD, hyperlipidemia, HTN, Barrett's Esophagus, COPD, lung cancer. Per cardiology notes on 09/08/23, his LDL is <55 (53) and well controlled. Patient will benefit from participation in intensive cardiac rehab for nutrition, exercise, and lifestyle modification.      Intervention Plan   Intervention Nutrition handout(s) given to patient.;Prescribe, educate and counsel regarding individualized specific dietary modifications aiming towards targeted core components such as weight, hypertension, lipid management, diabetes, heart failure and other comorbidities.    Expected Outcomes Short Term Goal: Understand basic principles of dietary content, such as calories, fat, sodium, cholesterol and nutrients.;Long Term Goal: Adherence to prescribed nutrition plan.             Nutrition Assessments:  Nutrition Assessments - 10/21/23 0907       Rate Your Plate Scores   Pre Score 56            MEDIFICTS Score Key: >=70 Need to make dietary changes  40-70 Heart Healthy Diet <= 40 Therapeutic Level Cholesterol Diet   Flowsheet Row INTENSIVE CARDIAC REHAB from 10/20/2023 in [facility]  Picture Your Plate Total Score on Admission 56      Picture Your Plate Scores: <16 Unhealthy dietary pattern with much room for improvement. 41-50 Dietary pattern unlikely to meet recommendations for good health and room for improvement. 51-60 More healthful dietary pattern, with some  room for improvement.  >60 Healthy dietary pattern, although there may be some specific behaviors that could be improved.    Nutrition Goals Re-Evaluation:  Nutrition Goals Re-Evaluation     Row Name 10/18/23 1159             Goals   Current Weight 171 lb 8.3 oz (77.8 kg)       Comment A1c WNL, lipoprotein a 170, LDL 53, HDL 47       Expected Outcome Daniel Colon reports no nutrition concerns at this time. He has previously completed cardiac rehab in 2021. He has medical history of CABG x4, CAD, hyperlipidemia, HTN, Barrett's Esophagus, COPD, lung cancer. Per cardiology notes on 09/08/23, his LDL is <55 (53) and well controlled. Patient will benefit from participation in intensive cardiac rehab for nutrition, exercise, and lifestyle modification.  Nutrition Goals Re-Evaluation:  Nutrition Goals Re-Evaluation     Row Name 10/18/23 1159             Goals   Current Weight 171 lb 8.3 oz (77.8 kg)       Comment A1c WNL, lipoprotein a 170, LDL 53, HDL 47       Expected Outcome Daniel Colon reports no nutrition concerns at this time. He has previously completed cardiac rehab in 2021. He has medical history of CABG x4, CAD, hyperlipidemia, HTN, Barrett's Esophagus, COPD, lung cancer. Per cardiology notes on 09/08/23, his LDL is <55 (53) and well controlled. Patient will benefit from participation in intensive cardiac rehab for nutrition, exercise, and lifestyle modification.                Nutrition Goals Discharge (Final Nutrition Goals Re-Evaluation):  Nutrition Goals Re-Evaluation - 10/18/23 1159       Goals   Current Weight 171 lb 8.3 oz (77.8 kg)    Comment A1c WNL, lipoprotein a 170, LDL 53, HDL 47    Expected Outcome Daniel Colon reports no nutrition concerns at this time. He has previously completed cardiac rehab in 2021. He has medical history of CABG x4, CAD, hyperlipidemia, HTN, Barrett's Esophagus, COPD, lung cancer. Per cardiology notes on 09/08/23, his LDL is <55 (53) and  well controlled. Patient will benefit from participation in intensive cardiac rehab for nutrition, exercise, and lifestyle modification.             Psychosocial: Target Goals: Acknowledge presence or absence of significant depression and/or stress, maximize coping skills, provide positive support system. Participant is able to verbalize types and ability to use techniques and skills needed for reducing stress and depression.  Initial Review & Psychosocial Screening:  Initial Psych Review & Screening - 10/12/23 0857       Initial Review   Current issues with None Identified      Family Dynamics   Good Support System? Yes   Pt has spouse Daniel Colon for support     Barriers   Psychosocial barriers to participate in program There are no identifiable barriers or psychosocial needs.      Screening Interventions   Interventions Encouraged to exercise             Quality of Life Scores:  Quality of Life - 10/12/23 1141       Quality of Life   Select Quality of Life      Quality of Life Scores   Health/Function Pre 18.83 %    Socioeconomic Pre 20.17 %    Psych/Spiritual Pre 22.86 %    Family Pre 27.05 %    GLOBAL Pre 21.17 %            Scores of 19 and below usually indicate a poorer quality of life in these areas.  A difference of  2-3 points is a clinically meaningful difference.  A difference of 2-3 points in the total score of the Quality of Life Index has been associated with significant improvement in overall quality of life, self-image, physical symptoms, and general health in studies assessing change in quality of life.  PHQ-9: Review Flowsheet       10/12/2023 08/08/2020 06/05/2020  Depression screen PHQ 2/9  Decreased Interest 0 0 0  Down, Depressed, Hopeless 0 0 0  PHQ - 2 Score 0 0 0  Altered sleeping 1 0 0  Tired, decreased energy 1 0 0  Change in appetite 0 0 0  Feeling bad or failure about yourself  0 0 0  Trouble concentrating 0 0 0  Moving slowly  or fidgety/restless 0 0 0  Suicidal thoughts 0 0 0  PHQ-9 Score 2 0 -  Difficult doing work/chores Not difficult at all Not difficult at all Not difficult at all   Interpretation of Total Score  Total Score Depression Severity:  1-4 = Minimal depression, 5-9 = Mild depression, 10-14 = Moderate depression, 15-19 = Moderately severe depression, 20-27 = Severe depression   Psychosocial Evaluation and Intervention:   Psychosocial Re-Evaluation:  Psychosocial Re-Evaluation     Row Name 11/08/23 1732 11/23/23 1434 12/23/23 1049 01/19/24 0911       Psychosocial Re-Evaluation   Current issues with None Identified None Identified None Identified None Identified    Interventions Encouraged to attend Cardiac Rehabilitation for the exercise Encouraged to attend Cardiac Rehabilitation for the exercise Encouraged to attend Cardiac Rehabilitation for the exercise Encouraged to attend Cardiac Rehabilitation for the exercise    Continue Psychosocial Services  No Follow up required No Follow up required No Follow up required No Follow up required             Psychosocial Discharge (Final Psychosocial Re-Evaluation):  Psychosocial Re-Evaluation - 01/19/24 0911       Psychosocial Re-Evaluation   Current issues with None Identified    Interventions Encouraged to attend Cardiac Rehabilitation for the exercise    Continue Psychosocial Services  No Follow up required             Vocational Rehabilitation: Provide vocational rehab assistance to qualifying candidates.   Vocational Rehab Evaluation & Intervention:  Vocational Rehab - 10/12/23 0857       Initial Vocational Rehab Evaluation & Intervention   Assessment shows need for Vocational Rehabilitation No   Pt is retired            Education: Education Goals: Education classes will be provided on a weekly basis, covering required topics. Participant will state understanding/return demonstration of topics presented.     Education     Row Name 10/20/23 0900     Education   Cardiac Education Topics Pritikin   Orthoptist   Educator Dietitian   Weekly Topic Simple Sides and Sauces   Instruction Review Code 1- Verbalizes Understanding   Class Start Time 0815   Class Stop Time 0900   Class Time Calculation (min) 45 min    Row Name 10/25/23 0900     Education   Cardiac Education Topics Pritikin   Select Workshops     Workshops   Educator Exercise Physiologist   Select Exercise   Exercise Workshop Managing Heart Disease: Your Path to a Healthier Heart   Instruction Review Code 1- Verbalizes Understanding   Class Start Time [phone]   Class Stop Time 0848   Class Time Calculation (min) 37 min    Row Name 10/27/23 0900     Education   Cardiac Education Topics Pritikin   Secondary school teacher School   Educator Dietitian   Weekly Topic Powerhouse Plant-Based Proteins   Instruction Review Code 1- Verbalizes Understanding   Class Start Time 0815   Class Stop Time 0858   Class Time Calculation (min) 43 min    Row Name 10/29/23 1400     Education   Cardiac Education Topics Pritikin   Nurse, children's  Exercise Physiologist   Select General Education   General Education Hypertension and Heart Disease   Instruction Review Code 1- Verbalizes Understanding   Class Start Time 0815   Class Stop Time 0903   Class Time Calculation (min) 48 min    Row Name 11/01/23 1500     Education   Cardiac Education Topics Pritikin   Geographical information systems officer Psychosocial   Psychosocial Workshop From Head to Heart: The Power of a Healthy Outlook   Instruction Review Code 1- Verbalizes Understanding   Class Start Time [phone]   Class Stop Time 0901   Class Time Calculation (min) 47 min    Row Name 11/08/23 1000     Education   Cardiac Education Topics Pritikin   Select Workshops      Workshops   Educator Exercise Physiologist   Select Exercise   Exercise Workshop Location manager and Fall Prevention   Instruction Review Code 1- Verbalizes Understanding   Class Start Time 0820   Class Stop Time 0905   Class Time Calculation (min) 45 min    Row Name 11/10/23 0900     Education   Cardiac Education Topics Pritikin   Secondary school teacher School   Educator Dietitian   Weekly Topic Fast and Healthy Breakfasts   Instruction Review Code 1- Verbalizes Understanding   Class Start Time 0815   Class Stop Time 0857   Class Time Calculation (min) 42 min    Row Name 11/15/23 0900     Education   Cardiac Education Topics Pritikin   Select Core Videos     Core Videos   Educator Exercise Physiologist   Select Psychosocial   Psychosocial Healthy Minds, Bodies, Hearts   Instruction Review Code 1- Verbalizes Understanding   Class Start Time 0805   Class Stop Time 0840   Class Time Calculation (min) 35 min    Row Name 11/29/23 1000     Workshops   Biomedical scientist Psychosocial   Psychosocial Workshop Recognizing and Reducing Stress   Instruction Review Code 1- Verbalizes Understanding   Class Start Time 0815   Class Stop Time 0900   Class Time Calculation (min) 45 min    Row Name 12/01/23 1100     Education   Cardiac Education Topics Pritikin   Secondary school teacher School   Educator Nurse;Respiratory Therapist   Weekly Topic Efficiency Cooking - Meals in a Snap   Instruction Review Code 1- Verbalizes Understanding   Class Start Time 0815   Class Stop Time 0851   Class Time Calculation (min) 36 min    Row Name 12/03/23 0900     Education   Cardiac Education Topics Pritikin   Select Core Videos     Core Videos   Educator Dietitian   Select Nutrition   Nutrition Calorie Density   Instruction Review Code 1- Verbalizes Understanding   Class Start Time 0815   Class Stop Time 0903   Class Time  Calculation (min) 48 min    Row Name 12/06/23 0900     Education   Cardiac Education Topics Pritikin   Select Workshops     Workshops   Educator Exercise Physiologist   Select Exercise   Exercise Workshop Exercise Basics: Building Your Action Plan   Instruction Review Code 1- Verbalizes Understanding   Class Start Time [phone]  Class Stop Time 0850   Class Time Calculation (min) 38 min    Row Name 12/10/23 0800     Education   Cardiac Education Topics Pritikin   Psychologist, forensic Exercise Education   Exercise Education Move It!   Instruction Review Code 1- Verbalizes Understanding   Class Start Time 0815   Class Stop Time 0855   Class Time Calculation (min) 40 min    Row Name 01/10/24 1100     Education   Cardiac Education Topics Pritikin   Select Workshops     Workshops   Educator Exercise Physiologist   Select Psychosocial   Psychosocial Workshop Healthy Sleep for a Healthy Heart   Instruction Review Code 1- Verbalizes Understanding   Class Start Time [phone]   Class Stop Time 0857   Class Time Calculation (min) 43 min    Row Name 01/12/24 1200     Education   Cardiac Education Topics Pritikin   Secondary school teacher School   Educator Nurse;Respiratory Therapist   Weekly Topic International Cuisine- Spotlight on the [facility] Zones   Instruction Review Code 1- Verbalizes Understanding   Class Start Time 0815   Class Stop Time 0851   Class Time Calculation (min) 36 min    Row Name 01/17/24 0800     Education   Cardiac Education Topics Pritikin   Select Workshops     Workshops   Educator Nurse   Select Exercise   Exercise Workshop Managing Heart Disease: Your Path to a Healthier Heart   Instruction Review Code 1- Verbalizes Understanding   Class Start Time [phone]   Class Stop Time 0846   Class Time Calculation (min) 35 min    Row Name 01/19/24 1000     Education   Cardiac  Education Topics Pritikin   Secondary school teacher School   Educator Dietitian;Respiratory Therapist   Weekly Topic Simple Sides and Sauces   Instruction Review Code 1- Verbalizes Understanding   Class Start Time 0815   Class Stop Time 0847   Class Time Calculation (min) 32 min            Core Videos: Exercise    Move It!  Clinical staff conducted group or individual video education with verbal and written material and guidebook.  Patient learns the recommended Pritikin exercise program. Exercise with the goal of living a long, healthy life. Some of the health benefits of exercise include controlled diabetes, healthier blood pressure levels, improved cholesterol levels, improved heart and lung capacity, improved sleep, and better body composition. Everyone should speak with their doctor before starting or changing an exercise routine.  Biomechanical Limitations Clinical staff conducted group or individual video education with verbal and written material and guidebook.  Patient learns how biomechanical limitations can impact exercise and how we can mitigate and possibly overcome limitations to have an impactful and balanced exercise routine.  Body Composition Clinical staff conducted group or individual video education with verbal and written material and guidebook.  Patient learns that body composition (ratio of muscle mass to fat mass) is a key component to assessing overall fitness, rather than body weight alone. Increased fat mass, especially visceral belly fat, can put [place] at increased risk for metabolic syndrome, type 2 diabetes, heart disease, and even death. It is recommended to combine diet and exercise (cardiovascular and resistance training) to improve your  body composition. Seek guidance from your physician and exercise physiologist before implementing an exercise routine.  Exercise Action Plan Clinical staff conducted group or individual video education with  verbal and written material and guidebook.  Patient learns the recommended strategies to achieve and enjoy long-term exercise adherence, including variety, self-motivation, self-efficacy, and positive decision making. Benefits of exercise include fitness, good health, weight management, more energy, better sleep, less stress, and overall well-being.  Medical   Heart Disease Risk Reduction Clinical staff conducted group or individual video education with verbal and written material and guidebook.  Patient learns our heart is our most vital organ as it circulates oxygen, nutrients, white blood cells, and hormones throughout the entire body, and carries waste away. Data supports a plant-based eating plan like the Pritikin Program for its effectiveness in slowing progression of and reversing heart disease. The video provides a number of recommendations to address heart disease.   Metabolic Syndrome and Belly Fat  Clinical staff conducted group or individual video education with verbal and written material and guidebook.  Patient learns what metabolic syndrome is, how it leads to heart disease, and how one can reverse it and keep it from coming back. You have metabolic syndrome if you have 3 of the following 5 criteria: abdominal obesity, high blood pressure, high triglycerides, low HDL cholesterol, and high blood sugar.  Hypertension and Heart Disease Clinical staff conducted group or individual video education with verbal and written material and guidebook.  Patient learns that high blood pressure, or hypertension, is very common in the [place]. Hypertension is largely due to excessive salt intake, but other important risk factors include being overweight, physical inactivity, drinking too much alcohol, smoking, and not eating enough potassium from fruits and vegetables. High blood pressure is a leading risk factor for heart attack, stroke, congestive heart failure, dementia, kidney failure, and  premature death. Long-term effects of excessive salt intake include stiffening of the arteries and thickening of heart muscle and organ damage. Recommendations include ways to reduce hypertension and the risk of heart disease.  Diseases of Our Time - Focusing on Diabetes Clinical staff conducted group or individual video education with verbal and written material and guidebook.  Patient learns why the best way to stop diseases of our time is prevention, through food and other lifestyle changes. Medicine (such as prescription pills and surgeries) is often only a Band-Aid on the problem, not a long-term solution. Most common diseases of our time include obesity, type 2 diabetes, hypertension, heart disease, and cancer. The Pritikin Program is recommended and has been proven to help reduce, reverse, and/or prevent the damaging effects of metabolic syndrome.  Nutrition   Overview of the Pritikin Eating Plan  Clinical staff conducted group or individual video education with verbal and written material and guidebook.  Patient learns about the Pritikin Eating Plan for disease risk reduction. The Pritikin Eating Plan emphasizes a wide variety of unrefined, minimally-processed carbohydrates, like fruits, vegetables, whole grains, and legumes. Go, Caution, and Stop food choices are explained. Plant-based and lean animal proteins are emphasized. Rationale provided for low sodium intake for blood pressure control, low added sugars for blood sugar stabilization, and low added fats and oils for coronary artery disease risk reduction and weight management.  Calorie Density  Clinical staff conducted group or individual video education with verbal and written material and guidebook.  Patient learns about calorie density and how it impacts the Pritikin Eating Plan. Knowing the characteristics of the food you choose will  help you decide whether those foods will lead to weight gain or weight loss, and whether you want to  consume more or less of them. Weight loss is usually a side effect of the Pritikin Eating Plan because of its focus on low calorie-dense foods.  Label Reading  Clinical staff conducted group or individual video education with verbal and written material and guidebook.  Patient learns about the Pritikin recommended label reading guidelines and corresponding recommendations regarding calorie density, added sugars, sodium content, and whole grains.  Dining Out - Part 1  Clinical staff conducted group or individual video education with verbal and written material and guidebook.  Patient learns that restaurant meals can be sabotaging because they can be so high in calories, fat, sodium, and/or sugar. Patient learns recommended strategies on how to positively address this and avoid unhealthy pitfalls.  Facts on Fats  Clinical staff conducted group or individual video education with verbal and written material and guidebook.  Patient learns that lifestyle modifications can be just as effective, if not more so, as many medications for lowering your risk of heart disease. A Pritikin lifestyle can help to reduce your risk of inflammation and atherosclerosis (cholesterol build-up, or plaque, in the artery walls). Lifestyle interventions such as dietary choices and physical activity address the cause of atherosclerosis. A review of the types of fats and their impact on blood cholesterol levels, along with dietary recommendations to reduce fat intake is also included.  Nutrition Action Plan  Clinical staff conducted group or individual video education with verbal and written material and guidebook.  Patient learns how to incorporate Pritikin recommendations into their lifestyle. Recommendations include planning and keeping personal health goals in mind as an important part of their success.  Healthy Mind-Set    Healthy Minds, Bodies, Hearts  Clinical staff conducted group or individual video education with  verbal and written material and guidebook.  Patient learns how to identify when they are stressed. Video will discuss the impact of that stress, as well as the many benefits of stress management. Patient will also be introduced to stress management techniques. The way we think, act, and feel has an impact on our hearts.  How Our Thoughts Can Heal Our Hearts  Clinical staff conducted group or individual video education with verbal and written material and guidebook.  Patient learns that negative thoughts can cause depression and anxiety. This can result in negative lifestyle behavior and serious health problems. Cognitive behavioral therapy is an effective method to help control our thoughts in order to change and improve our emotional outlook.  Additional Videos:  Exercise    Improving Performance  Clinical staff conducted group or individual video education with verbal and written material and guidebook.  Patient learns to use a non-linear approach by alternating intensity levels and lengths of time spent exercising to help burn more calories and lose more body fat. Cardiovascular exercise helps improve heart health, metabolism, hormonal balance, blood sugar control, and recovery from fatigue. Resistance training improves strength, endurance, balance, coordination, reaction time, metabolism, and muscle mass. Flexibility exercise improves circulation, posture, and balance. Seek guidance from your physician and exercise physiologist before implementing an exercise routine and learn your capabilities and proper form for all exercise.  Introduction to Yoga  Clinical staff conducted group or individual video education with verbal and written material and guidebook.  Patient learns about yoga, a discipline of the coming together of mind, breath, and body. The benefits of yoga include improved flexibility, improved range of  motion, better posture and core strength, increased lung function, weight loss, and  positive self-image. Yoga's heart health benefits include lowered blood pressure, healthier heart rate, decreased cholesterol and triglyceride levels, improved immune function, and reduced stress. Seek guidance from your physician and exercise physiologist before implementing an exercise routine and learn your capabilities and proper form for all exercise.  Medical   Aging: Enhancing Your Quality of Life  Clinical staff conducted group or individual video education with verbal and written material and guidebook.  Patient learns key strategies and recommendations to stay in good physical health and enhance quality of life, such as prevention strategies, having an advocate, securing a Health Care Proxy and Power of Attorney, and keeping a list of medications and system for tracking them. It also discusses how to avoid risk for bone loss.  Biology of Weight Control  Clinical staff conducted group or individual video education with verbal and written material and guidebook.  Patient learns that weight gain occurs because we consume more calories than we burn (eating more, moving less). Even if your body weight is normal, you may have higher ratios of fat compared to muscle mass. Too much body fat puts you at increased risk for cardiovascular disease, heart attack, stroke, type 2 diabetes, and obesity-related cancers. In addition to exercise, following the Pritikin Eating Plan can help reduce your risk.  Decoding Lab Results  Clinical staff conducted group or individual video education with verbal and written material and guidebook.  Patient learns that lab test reflects one measurement whose values change over time and are influenced by many factors, including medication, stress, sleep, exercise, food, hydration, pre-existing medical conditions, and more. It is recommended to use the knowledge from this video to become more involved with your lab results and evaluate your numbers to speak with your  doctor.   Diseases of Our Time - Overview  Clinical staff conducted group or individual video education with verbal and written material and guidebook.  Patient learns that according to the CDC, 50% to 70% of chronic diseases (such as obesity, type 2 diabetes, elevated lipids, hypertension, and heart disease) are avoidable through lifestyle improvements including healthier food choices, listening to satiety cues, and increased physical activity.  Sleep Disorders Clinical staff conducted group or individual video education with verbal and written material and guidebook.  Patient learns how good quality and duration of sleep are important to overall health and well-being. Patient also learns about sleep disorders and how they impact health along with recommendations to address them, including discussing with a physician.  Nutrition  Dining Out - Part 2 Clinical staff conducted group or individual video education with verbal and written material and guidebook.  Patient learns how to plan ahead and communicate in order to maximize their dining experience in a healthy and nutritious manner. Included are recommended food choices based on the type of restaurant the patient is visiting.   Fueling a Banker conducted group or individual video education with verbal and written material and guidebook.  There is a strong connection between our food choices and our health. Diseases like obesity and type 2 diabetes are very prevalent and are in large-part due to lifestyle choices. The Pritikin Eating Plan provides plenty of food and hunger-curbing satisfaction. It is easy to follow, affordable, and helps reduce health risks.  Menu Workshop  Clinical staff conducted group or individual video education with verbal and written material and guidebook.  Patient learns that restaurant meals can sabotage  health goals because they are often packed with calories, fat, sodium, and sugar.  Recommendations include strategies to plan ahead and to communicate with the manager, chef, or server to help order a healthier meal.  Planning Your Eating Strategy  Clinical staff conducted group or individual video education with verbal and written material and guidebook.  Patient learns about the Pritikin Eating Plan and its benefit of reducing the risk of disease. The Pritikin Eating Plan does not focus on calories. Instead, it emphasizes high-quality, nutrient-rich foods. By knowing the characteristics of the foods, we choose, we can determine their calorie density and make informed decisions.  Targeting Your Nutrition Priorities  Clinical staff conducted group or individual video education with verbal and written material and guidebook.  Patient learns that lifestyle habits have a tremendous impact on disease risk and progression. This video provides eating and physical activity recommendations based on your personal health goals, such as reducing LDL cholesterol, losing weight, preventing or controlling type 2 diabetes, and reducing high blood pressure.  Vitamins and Minerals  Clinical staff conducted group or individual video education with verbal and written material and guidebook.  Patient learns different ways to obtain key vitamins and minerals, including through a recommended healthy diet. It is important to discuss all supplements you take with your doctor.   Healthy Mind-Set    Smoking Cessation  Clinical staff conducted group or individual video education with verbal and written material and guidebook.  Patient learns that cigarette smoking and tobacco addiction pose a serious health risk which affects millions of people. Stopping smoking will significantly reduce the risk of heart disease, lung disease, and many forms of cancer. Recommended strategies for quitting are covered, including working with your doctor to develop a successful plan.  Culinary   Becoming a Corporate investment banker conducted group or individual video education with verbal and written material and guidebook.  Patient learns that cooking at home can be healthy, cost-effective, quick, and puts them in control. Keys to cooking healthy recipes will include looking at your recipe, assessing your equipment needs, planning ahead, making it simple, choosing cost-effective seasonal ingredients, and limiting the use of added fats, salts, and sugars.  Cooking - Breakfast and Snacks  Clinical staff conducted group or individual video education with verbal and written material and guidebook.  Patient learns how important breakfast is to satiety and nutrition through the entire day. Recommendations include key foods to eat during breakfast to help stabilize blood sugar levels and to prevent overeating at meals later in the day. Planning ahead is also a key component.  Cooking - Educational psychologist conducted group or individual video education with verbal and written material and guidebook.  Patient learns eating strategies to improve overall health, including an approach to cook more at home. Recommendations include thinking of animal protein as a side on your plate rather than center stage and focusing instead on lower calorie dense options like vegetables, fruits, whole grains, and plant-based proteins, such as beans. Making sauces in large quantities to freeze for later and leaving the skin on your vegetables are also recommended to maximize your experience.  Cooking - Healthy Salads and Dressing Clinical staff conducted group or individual video education with verbal and written material and guidebook.  Patient learns that vegetables, fruits, whole grains, and legumes are the foundations of the Pritikin Eating Plan. Recommendations include how to incorporate each of these in flavorful and healthy salads, and how to create homemade  salad dressings. Proper handling of ingredients is also covered.  Cooking - Soups and [employer] - Soups and Desserts Clinical staff conducted group or individual video education with verbal and written material and guidebook.  Patient learns that Pritikin soups and desserts make for easy, nutritious, and delicious snacks and meal components that are low in sodium, fat, sugar, and calorie density, while high in vitamins, minerals, and filling fiber. Recommendations include simple and healthy ideas for soups and desserts.   Overview     The Pritikin Solution Program Overview Clinical staff conducted group or individual video education with verbal and written material and guidebook.  Patient learns that the results of the Pritikin Program have been documented in more than 100 articles published in peer-reviewed journals, and the benefits include reducing risk factors for (and, in some cases, even reversing) high cholesterol, high blood pressure, type 2 diabetes, obesity, and more! An overview of the three key pillars of the Pritikin Program will be covered: eating well, doing regular exercise, and having a healthy mind-set.  WORKSHOPS  Exercise: Exercise Basics: Building Your Action Plan Clinical staff led group instruction and group discussion with PowerPoint presentation and patient guidebook. To enhance the learning environment the use of posters, models and videos may be added. At the conclusion of this workshop, patients will comprehend the difference between physical activity and exercise, as well as the benefits of incorporating both, into their routine. Patients will understand the FITT (Frequency, Intensity, Time, and Type) principle and how to use it to build an exercise action plan. In addition, safety concerns and other considerations for exercise and cardiac rehab will be addressed by the presenter. The purpose of this lesson is to promote a comprehensive and effective weekly exercise routine in order to improve patients' overall level of  fitness.   Managing Heart Disease: Your Path to a Healthier Heart Clinical staff led group instruction and group discussion with PowerPoint presentation and patient guidebook. To enhance the learning environment the use of posters, models and videos may be added.At the conclusion of this workshop, patients will understand the anatomy and physiology of the heart. Additionally, they will understand how Pritikin's three pillars impact the risk factors, the progression, and the management of heart disease.  The purpose of this lesson is to provide a high-level overview of the heart, heart disease, and how the Pritikin lifestyle positively impacts risk factors.  Exercise Biomechanics Clinical staff led group instruction and group discussion with PowerPoint presentation and patient guidebook. To enhance the learning environment the use of posters, models and videos may be added. Patients will learn how the structural parts of their bodies function and how these functions impact their daily activities, movement, and exercise. Patients will learn how to promote a neutral spine, learn how to manage pain, and identify ways to improve their physical movement in order to promote healthy living. The purpose of this lesson is to expose patients to common physical limitations that impact physical activity. Participants will learn practical ways to adapt and manage aches and pains, and to minimize their effect on regular exercise. Patients will learn how to maintain good posture while sitting, walking, and lifting.  Balance Training and Fall Prevention  Clinical staff led group instruction and group discussion with PowerPoint presentation and patient guidebook. To enhance the learning environment the use of posters, models and videos may be added. At the conclusion of this workshop, patients will understand the importance of their sensorimotor skills (vision, proprioception, and the vestibular  system)  in maintaining their ability to balance as they age. Patients will apply a variety of balancing exercises that are appropriate for their current level of function. Patients will understand the common causes for poor balance, possible solutions to these problems, and ways to modify their physical environment in order to minimize their fall risk. The purpose of this lesson is to teach patients about the importance of maintaining balance as they age and ways to minimize their risk of falling.  WORKSHOPS   Nutrition:  Fueling a Ship broker led group instruction and group discussion with PowerPoint presentation and patient guidebook. To enhance the learning environment the use of posters, models and videos may be added. Patients will review the foundational principles of the Pritikin Eating Plan and understand what constitutes a serving size in each of the food groups. Patients will also learn Pritikin-friendly foods that are better choices when away from home and review make-ahead meal and snack options. Calorie density will be reviewed and applied to three nutrition priorities: weight maintenance, weight loss, and weight gain. The purpose of this lesson is to reinforce (in a group setting) the key concepts around what patients are recommended to eat and how to apply these guidelines when away from home by planning and selecting Pritikin-friendly options. Patients will understand how calorie density may be adjusted for different weight management goals.  Mindful Eating  Clinical staff led group instruction and group discussion with PowerPoint presentation and patient guidebook. To enhance the learning environment the use of posters, models and videos may be added. Patients will briefly review the concepts of the Pritikin Eating Plan and the importance of low-calorie dense foods. The concept of mindful eating will be introduced as well as the importance of paying attention to internal hunger  signals. Triggers for non-hunger eating and techniques for dealing with triggers will be explored. The purpose of this lesson is to provide patients with the opportunity to review the basic principles of the Pritikin Eating Plan, discuss the value of eating mindfully and how to measure internal cues of hunger and fullness using the Hunger Scale. Patients will also discuss reasons for non-hunger eating and learn strategies to use for controlling emotional eating.  Targeting Your Nutrition Priorities Clinical staff led group instruction and group discussion with PowerPoint presentation and patient guidebook. To enhance the learning environment the use of posters, models and videos may be added. Patients will learn how to determine their genetic susceptibility to disease by reviewing their family history. Patients will gain insight into the importance of diet as part of an overall healthy lifestyle in mitigating the impact of genetics and other environmental insults. The purpose of this lesson is to provide patients with the opportunity to assess their personal nutrition priorities by looking at their family history, their own health history and current risk factors. Patients will also be able to discuss ways of prioritizing and modifying the Pritikin Eating Plan for their highest risk areas  Menu  Clinical staff led group instruction and group discussion with PowerPoint presentation and patient guidebook. To enhance the learning environment the use of posters, models and videos may be added. Using menus brought in from [employer], or printed from [employer], patients will apply the Pritikin dining out guidelines that were presented in the [employer] video. Patients will also be able to practice these guidelines in a variety of provided scenarios. The purpose of this lesson is to provide patients with the opportunity to practice  hands-on learning of the [employer] guidelines  with actual menus and practice scenarios.  Label Reading Clinical staff led group instruction and group discussion with PowerPoint presentation and patient guidebook. To enhance the learning environment the use of posters, models and videos may be added. Patients will review and discuss the Pritikin label reading guidelines presented in Pritikin's Label Reading Educational series video. Using fool labels brought in from local grocery stores and markets, patients will apply the label reading guidelines and determine if the packaged food meet the Pritikin guidelines. The purpose of this lesson is to provide patients with the opportunity to review, discuss, and practice hands-on learning of the Pritikin Label Reading guidelines with actual packaged food labels. Cooking School  Pritikin's [employer] are designed to teach patients ways to prepare quick, simple, and affordable recipes at home. The importance of nutrition's role in chronic disease risk reduction is reflected in its emphasis in the overall Pritikin program. By learning how to prepare essential core Pritikin Eating Plan recipes, patients will increase control over what they eat; be able to customize the flavor of foods without the use of added salt, sugar, or fat; and improve the quality of the food they consume. By learning a set of core recipes which are easily assembled, quickly prepared, and affordable, patients are more likely to prepare more healthy foods at home. These workshops focus on convenient breakfasts, simple entres, side dishes, and desserts which can be prepared with minimal effort and are consistent with nutrition recommendations for cardiovascular risk reduction. Cooking [employer] are taught by a Armed forces logistics/support/administrative officer (RD) who has been trained by the [employer]. The chef or RD has a clear understanding of the importance of minimizing - if not completely eliminating - added fat, sugar, and  sodium in recipes. Throughout the series of Cooking School Workshop sessions, patients will learn about healthy ingredients and efficient methods of cooking to build confidence in their capability to prepare    Cooking School weekly topics:  Adding Flavor- Sodium-Free  Fast and Healthy Breakfasts  Powerhouse Plant-Based Proteins  Satisfying Salads and Dressings  Simple Sides and Sauces  International Cuisine-Spotlight on the [employer] Zones  Delicious Desserts  Savory Soups  [employer] - Meals in a Astronomer Appetizers and Snacks  Comforting Weekend Breakfasts  One-Pot Wonders   Fast Evening Meals  Landscape architect Your Pritikin Plate  WORKSHOPS   Healthy Mindset (Psychosocial):  Focused Goals, Sustainable Changes Clinical staff led group instruction and group discussion with PowerPoint presentation and patient guidebook. To enhance the learning environment the use of posters, models and videos may be added. Patients will be able to apply effective goal setting strategies to establish at least one personal goal, and then take consistent, meaningful action toward that goal. They will learn to identify common barriers to achieving personal goals and develop strategies to overcome them. Patients will also gain an understanding of how our mind-set can impact our ability to achieve goals and the importance of cultivating a positive and growth-oriented mind-set. The purpose of this lesson is to provide patients with a deeper understanding of how to set and achieve personal goals, as well as the tools and strategies needed to overcome common obstacles which may arise along the way.  From Head to Heart: The Power of a Healthy Outlook  Clinical staff led group instruction and group discussion with PowerPoint presentation and patient guidebook. To enhance the learning environment the use  of posters, models and videos may be added. Patients will be able to recognize and  describe the impact of emotions and mood on physical health. They will discover the importance of self-care and explore self-care practices which may work for them. Patients will also learn how to utilize the 4 C's to cultivate a healthier outlook and better manage stress and challenges. The purpose of this lesson is to demonstrate to patients how a healthy outlook is an essential part of maintaining good health, especially as they continue their cardiac rehab journey.  Healthy Sleep for a Healthy Heart Clinical staff led group instruction and group discussion with PowerPoint presentation and patient guidebook. To enhance the learning environment the use of posters, models and videos may be added. At the conclusion of this workshop, patients will be able to demonstrate knowledge of the importance of sleep to overall health, well-being, and quality of life. They will understand the symptoms of, and treatments for, common sleep disorders. Patients will also be able to identify daytime and nighttime behaviors which impact sleep, and they will be able to apply these tools to help manage sleep-related challenges. The purpose of this lesson is to provide patients with a general overview of sleep and outline the importance of quality sleep. Patients will learn about a few of the most common sleep disorders. Patients will also be introduced to the concept of "sleep hygiene," and discover ways to self-manage certain sleeping problems through simple daily behavior changes. Finally, the workshop will motivate patients by clarifying the links between quality sleep and their goals of heart-healthy living.   Recognizing and Reducing Stress Clinical staff led group instruction and group discussion with PowerPoint presentation and patient guidebook. To enhance the learning environment the use of posters, models and videos may be added. At the conclusion of this workshop, patients will be able to understand the types of stress  reactions, differentiate between acute and chronic stress, and recognize the impact that chronic stress has on their health. They will also be able to apply different coping mechanisms, such as reframing negative self-talk. Patients will have the opportunity to practice a variety of stress management techniques, such as deep abdominal breathing, progressive muscle relaxation, and/or guided imagery.  The purpose of this lesson is to educate patients on the role of stress in their lives and to provide healthy techniques for coping with it.  Learning Barriers/Preferences:  Learning Barriers/Preferences - 10/12/23 1142       Learning Barriers/Preferences   Learning Barriers None    Learning Preferences Audio;Computer/Internet;Group Instruction;Individual Instruction;Pictoral;Skilled Demonstration;Verbal Instruction;Video;Written Material             Education Topics:  Knowledge Questionnaire Score:  Knowledge Questionnaire Score - 10/12/23 1141       Knowledge Questionnaire Score   Pre Score 19/24             Core Components/Risk Factors/Patient Goals at Admission:  Personal Goals and Risk Factors at Admission - 10/12/23 1142       Core Components/Risk Factors/Patient Goals on Admission   Improve shortness of breath with ADL's Yes    Intervention Provide education, individualized exercise plan and daily activity instruction to help decrease symptoms of SOB with activities of daily living.    Expected Outcomes Short Term: Improve cardiorespiratory fitness to achieve a reduction of symptoms when performing ADLs;Long Term: Be able to perform more ADLs without symptoms or delay the onset of symptoms    Hypertension Yes    Intervention Provide education on  lifestyle modifcations including regular physical activity/exercise, weight management, moderate sodium restriction and increased consumption of fresh fruit, vegetables, and low fat dairy, alcohol moderation, and smoking  cessation.;Monitor prescription use compliance.    Expected Outcomes Short Term: Continued assessment and intervention until BP is < 140/74mm HG in hypertensive participants. < 130/75mm HG in hypertensive participants with diabetes, heart failure or chronic kidney disease.;Long Term: Maintenance of blood pressure at goal levels.    Lipids Yes    Intervention Provide education and support for participant on nutrition & aerobic/resistive exercise along with prescribed medications to achieve LDL 70mg , HDL >40mg .    Expected Outcomes Short Term: Participant states understanding of desired cholesterol values and is compliant with medications prescribed. Participant is following exercise prescription and nutrition guidelines.;Long Term: Cholesterol controlled with medications as prescribed, with individualized exercise RX and with personalized nutrition plan. Value goals: LDL < 70mg , HDL > 40 mg.             Core Components/Risk Factors/Patient Goals Review:   Goals and Risk Factor Review     Row Name 11/08/23 1733 11/23/23 1435 12/23/23 1049 01/19/24 0912       Core Components/Risk Factors/Patient Goals Review   Personal Goals Review Lipids;Hypertension;Improve shortness of breath with ADL's Lipids;Hypertension;Improve shortness of breath with ADL's Lipids;Hypertension;Improve shortness of breath with ADL's Lipids;Hypertension;Improve shortness of breath with ADL's    Review Daniel Colon has been doing well with exercise. Vital signs have been stable Daniel Colon has been doing well with exercise. Vital signs have been stable. Daniel Colon is scheuled to have an inguinal hernia repair on 11/24/23/  laproscopic approach. Daniel Colon has been doing well with exercise. Vital signs have been stable. Daniel Colon has been doing well with exercise s/p hernia repair surgery. Daniel Colon will complete cardiac rehab on 01/05/24. Daniel Colon has been doing well with exercise. Vital signs remain stable. Daniel Colon will complete cardiac rehab on 02/14 /25.    Expected Outcomes  Daniel Colon will continue to particpate in cardiac rehab for exercise, nutrtion and lifestyle modfications Daniel Colon will continue to particpate in cardiac rehab for exercise, nutrtion and lifestyle modfications Daniel Colon will continue to particpate in cardiac rehab for exercise, nutrtion and lifestyle modfications Daniel Colon will continue to particpate in cardiac rehab for exercise, nutrtion and lifestyle modfications             Core Components/Risk Factors/Patient Goals at Discharge (Final Review):   Goals and Risk Factor Review - 01/19/24 0912       Core Components/Risk Factors/Patient Goals Review   Personal Goals Review Lipids;Hypertension;Improve shortness of breath with ADL's    Review Daniel Colon has been doing well with exercise. Vital signs remain stable. Daniel Colon will complete cardiac rehab on 02/14 /25.    Expected Outcomes Daniel Colon will continue to particpate in cardiac rehab for exercise, nutrtion and lifestyle modfications             ITP Comments:  ITP Comments     Row Name 10/12/23 8295 11/08/23 1730 11/23/23 1432 12/23/23 1048 01/19/24 0911   ITP Comments Armanda Magic, MD: Medical Director.  Introduction to the [facility] / Intensive Cardiac Rehab. Initial orientation packet reviewed with the patient. 30 Day ITP comments. Daniel Colon has good attendance and participation in cardiac rehab. 30 Day ITP comments. Daniel Colon has good attendance and participation in cardiac rehab. it is noted that Daniel Colon is scheduled to have Laproscopic ingunal hernia repair tomorrow. 30 Day ITP comments. Daniel Colon has good attendance and participation in cardiac rehab. Daniel Colon will complete cardiac rehab  on 01/05/24 30 Day ITP comments. Daniel Colon has good attendance and participation in cardiac rehab. Daniel Colon will complete cardiac rehab on 02/04/24            Comments: See ITP comments.Thayer Headings RN BSN

## 2024-01-21 ENCOUNTER — Encounter (HOSPITAL_COMMUNITY)
Admission: RE | Admit: 2024-01-21 | Discharge: 2024-01-21 | Disposition: A | Discharge status: Home / Self Care | Payer: Medicare Other | Source: Ambulatory Visit | Attending: Cardiology | Admitting: Cardiology

## 2024-01-21 DIAGNOSIS — Z951 Presence of aortocoronary bypass graft: Secondary | ICD-10-CM | POA: Diagnosis not present

## 2024-01-21 DIAGNOSIS — M25642 Stiffness of left hand, not elsewhere classified: Secondary | ICD-10-CM | POA: Diagnosis not present

## 2024-01-22 ENCOUNTER — Encounter: Payer: Self-pay | Admitting: Internal Medicine

## 2024-01-24 ENCOUNTER — Encounter (HOSPITAL_COMMUNITY)
Admission: RE | Admit: 2024-01-24 | Discharge: 2024-01-24 | Disposition: A | Discharge status: Home / Self Care | Payer: Medicare Other | Source: Ambulatory Visit | Attending: Cardiology | Admitting: Cardiology

## 2024-01-24 DIAGNOSIS — Z951 Presence of aortocoronary bypass graft: Secondary | ICD-10-CM | POA: Diagnosis not present

## 2024-01-26 ENCOUNTER — Encounter (HOSPITAL_COMMUNITY)
Admission: RE | Admit: 2024-01-26 | Discharge: 2024-01-26 | Disposition: A | Discharge status: Home / Self Care | Payer: Medicare Other | Source: Ambulatory Visit | Attending: Cardiology | Admitting: Cardiology

## 2024-01-26 DIAGNOSIS — Z951 Presence of aortocoronary bypass graft: Secondary | ICD-10-CM

## 2024-01-28 ENCOUNTER — Encounter (HOSPITAL_COMMUNITY)
Admission: RE | Admit: 2024-01-28 | Discharge: 2024-01-28 | Disposition: A | Discharge status: Home / Self Care | Payer: Medicare Other | Source: Ambulatory Visit | Attending: Cardiology | Admitting: Cardiology

## 2024-01-28 ENCOUNTER — Encounter (HOSPITAL_COMMUNITY): Admission: RE | Admit: 2024-01-28 | Payer: Medicare Other | Source: Ambulatory Visit

## 2024-01-28 DIAGNOSIS — M25642 Stiffness of left hand, not elsewhere classified: Secondary | ICD-10-CM | POA: Diagnosis not present

## 2024-01-28 DIAGNOSIS — Z951 Presence of aortocoronary bypass graft: Secondary | ICD-10-CM | POA: Diagnosis not present

## 2024-01-29 ENCOUNTER — Encounter: Payer: Self-pay | Admitting: Internal Medicine

## 2024-01-31 ENCOUNTER — Encounter (HOSPITAL_COMMUNITY)
Admission: RE | Admit: 2024-01-31 | Discharge: 2024-01-31 | Disposition: A | Discharge status: Home / Self Care | Payer: Medicare Other | Source: Ambulatory Visit | Attending: Cardiology | Admitting: Cardiology

## 2024-01-31 VITALS — Ht 67.25 in | Wt 164.5 lb

## 2024-01-31 DIAGNOSIS — Z951 Presence of aortocoronary bypass graft: Secondary | ICD-10-CM | POA: Diagnosis not present

## 2024-02-02 ENCOUNTER — Encounter (HOSPITAL_COMMUNITY)
Admission: RE | Admit: 2024-02-02 | Discharge: 2024-02-02 | Disposition: A | Discharge status: Home / Self Care | Payer: Medicare Other | Source: Ambulatory Visit | Attending: Cardiology | Admitting: Cardiology

## 2024-02-02 DIAGNOSIS — Z951 Presence of aortocoronary bypass graft: Secondary | ICD-10-CM

## 2024-02-03 DIAGNOSIS — Z125 Encounter for screening for malignant neoplasm of prostate: Secondary | ICD-10-CM | POA: Diagnosis not present

## 2024-02-03 DIAGNOSIS — E291 Testicular hypofunction: Secondary | ICD-10-CM | POA: Diagnosis not present

## 2024-02-03 DIAGNOSIS — I10 Essential (primary) hypertension: Secondary | ICD-10-CM | POA: Diagnosis not present

## 2024-02-03 DIAGNOSIS — M25642 Stiffness of left hand, not elsewhere classified: Secondary | ICD-10-CM | POA: Diagnosis not present

## 2024-02-03 DIAGNOSIS — Z1212 Encounter for screening for malignant neoplasm of rectum: Secondary | ICD-10-CM | POA: Diagnosis not present

## 2024-02-03 DIAGNOSIS — E785 Hyperlipidemia, unspecified: Secondary | ICD-10-CM | POA: Diagnosis not present

## 2024-02-04 ENCOUNTER — Encounter (HOSPITAL_COMMUNITY)
Admission: RE | Admit: 2024-02-04 | Discharge: 2024-02-04 | Disposition: A | Discharge status: Home / Self Care | Payer: Medicare Other | Source: Ambulatory Visit | Attending: Cardiology | Admitting: Cardiology

## 2024-02-04 DIAGNOSIS — Z951 Presence of aortocoronary bypass graft: Secondary | ICD-10-CM | POA: Diagnosis not present

## 2024-02-08 ENCOUNTER — Ambulatory Visit (AMBULATORY_SURGERY_CENTER): Payer: Medicare Other | Admitting: Internal Medicine

## 2024-02-08 ENCOUNTER — Encounter: Payer: Self-pay | Admitting: Internal Medicine

## 2024-02-08 VITALS — BP 97/51 | HR 51 | Temp 97.5°F | Resp 16

## 2024-02-08 DIAGNOSIS — K2270 Barrett's esophagus without dysplasia: Secondary | ICD-10-CM

## 2024-02-08 DIAGNOSIS — K449 Diaphragmatic hernia without obstruction or gangrene: Secondary | ICD-10-CM | POA: Diagnosis not present

## 2024-02-08 DIAGNOSIS — D128 Benign neoplasm of rectum: Secondary | ICD-10-CM | POA: Diagnosis not present

## 2024-02-08 DIAGNOSIS — D125 Benign neoplasm of sigmoid colon: Secondary | ICD-10-CM | POA: Diagnosis not present

## 2024-02-08 DIAGNOSIS — E785 Hyperlipidemia, unspecified: Secondary | ICD-10-CM | POA: Diagnosis not present

## 2024-02-08 DIAGNOSIS — K209 Esophagitis, unspecified without bleeding: Secondary | ICD-10-CM | POA: Diagnosis not present

## 2024-02-08 DIAGNOSIS — K5730 Diverticulosis of large intestine without perforation or abscess without bleeding: Secondary | ICD-10-CM | POA: Diagnosis not present

## 2024-02-08 DIAGNOSIS — Z860101 Personal history of adenomatous and serrated colon polyps: Secondary | ICD-10-CM

## 2024-02-08 DIAGNOSIS — K648 Other hemorrhoids: Secondary | ICD-10-CM

## 2024-02-08 DIAGNOSIS — I10 Essential (primary) hypertension: Secondary | ICD-10-CM | POA: Diagnosis not present

## 2024-02-08 DIAGNOSIS — K621 Rectal polyp: Secondary | ICD-10-CM

## 2024-02-08 DIAGNOSIS — I2510 Atherosclerotic heart disease of native coronary artery without angina pectoris: Secondary | ICD-10-CM | POA: Diagnosis not present

## 2024-02-08 DIAGNOSIS — B3781 Candidal esophagitis: Secondary | ICD-10-CM | POA: Diagnosis not present

## 2024-02-08 DIAGNOSIS — Z980 Intestinal bypass and anastomosis status: Secondary | ICD-10-CM

## 2024-02-08 DIAGNOSIS — R718 Other abnormality of red blood cells: Secondary | ICD-10-CM | POA: Diagnosis not present

## 2024-02-08 DIAGNOSIS — K2950 Unspecified chronic gastritis without bleeding: Secondary | ICD-10-CM

## 2024-02-08 DIAGNOSIS — Z1211 Encounter for screening for malignant neoplasm of colon: Secondary | ICD-10-CM

## 2024-02-08 DIAGNOSIS — K635 Polyp of colon: Secondary | ICD-10-CM | POA: Diagnosis not present

## 2024-02-08 DIAGNOSIS — D127 Benign neoplasm of rectosigmoid junction: Secondary | ICD-10-CM | POA: Diagnosis not present

## 2024-02-08 HISTORY — PX: COLONOSCOPY WITH ESOPHAGOGASTRODUODENOSCOPY (EGD): SHX5779

## 2024-02-08 MED ORDER — SODIUM CHLORIDE 0.9 % IV SOLN: 500.0000 mL | Freq: Once | INTRAVENOUS | Status: DC

## 2024-02-08 MED ORDER — FLUCONAZOLE 100 MG PO TABS: ORAL_TABLET | ORAL | 0 refills | Status: AC

## 2024-02-08 NOTE — Op Note (Addendum)
[facility] Patient Name: Daniel Colon Procedure Date: 02/08/2024 8:06 AM MRN: [id number] Endoscopist: Beverley Fiedler , MD, [id number] Age: 78 Referring MD:  Date of Birth: 1946-04-11 Gender: Male Account #: [id number] Procedure:                Upper GI endoscopy Indications:              Surveillance for malignancy due to personal history                            of Barrett's esophagus Medicines:                Monitored Anesthesia Care Procedure:                Pre-Anesthesia Assessment:                           - Prior to the procedure, a History and Physical                            was performed, and patient medications and                            allergies were reviewed. The patient's tolerance of                            previous anesthesia was also reviewed. The risks                            and benefits of the procedure and the sedation                            options and risks were discussed with the patient.                            All questions were answered, and informed consent                            was obtained. Prior Anticoagulants: The patient has                            taken no anticoagulant or antiplatelet agents. ASA                            Grade Assessment: III - A patient with severe                            systemic disease. After reviewing the risks and                            benefits, the patient was deemed in satisfactory                            condition to undergo the procedure.  After obtaining informed consent, the endoscope was                            passed under direct vision. Throughout the                            procedure, the patient's blood pressure, pulse, and                            oxygen saturations were monitored continuously. The                            GIF [id number] #[id number] was introduced through the                            mouth, and advanced to the second  part of duodenum.                            The upper GI endoscopy was accomplished without                            difficulty. The patient tolerated the procedure                            well. Scope In: Scope Out: Findings:                 Patchy, yellow plaques were found in the upper                            third of the esophagus and in the middle third of                            the esophagus.                           The esophagus and gastroesophageal junction were                            examined with white light and narrow band imaging                            (NBI) from a forward view and retroflexed position.                            There were esophageal mucosal changes consistent                            with Barrett's esophagus. These changes involved                            the mucosa at the upper extent of the gastric folds                            (  41 cm from the incisors) extending to the Z-line                            (37 cm from the incisors). Circumferential                            salmon-colored mucosa was present from 38 to 41 cm,                            two tongues of salmon-colored mucosa were present                            from 37 to 38 cm and scattered islands of                            salmon-colored mucosa were present at 37 cm. The                            maximum longitudinal extent of these esophageal                            mucosal changes was 4 cm in length. Mucosa was                            biopsied with a cold forceps for histology in a                            targeted manner and in 4 quadrants at intervals of                            2 cm from 37 to 41 cm from the incisors. A total of                            3 specimen bottles were sent to pathology.                           A 1 cm hiatal hernia was present.                           Patchy mildly erythematous mucosa was found in the                             gastric body. Biopsies were taken with a cold                            forceps for Helicobacter pylori testing.                           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 The gastrojejunal anastomosis was  characterized by healthy appearing mucosa. This was                            traversed. The efferent limb was examined and was                            characterized by healthy appearing mucosa. The                            afferent limb was examined and was characterized by                            healthy appearing mucosa. Complications:            No immediate complications. Estimated Blood Loss:     Estimated blood loss was minimal. Impression:               - Mild Candida esophagitis.                           - Esophageal mucosal changes consistent with                            Barrett's esophagus. Biopsied.                           - 1 cm hiatal hernia.                           - Erythematous mucosa in the gastric body. Biopsied.                           - Patent Billroth II gastrojejunostomy was found,                            characterized by healthy appearing mucosa. Recommendation:           - Patient has a contact number available for                            emergencies. The signs and symptoms of potential                            delayed complications were discussed with the                            patient. Return to normal activities tomorrow.                            Written discharge instructions were provided to the                            patient.                           - Resume previous diet.                           -  Continue present medications. Fluconazole 200 mg                            x 1 day, 100 mg x 13 days.                           - Await pathology results. Beverley Fiedler, MD 02/08/2024 8:48:27 AM This report has been  signed electronically.

## 2024-02-08 NOTE — Progress Notes (Signed)
GASTROENTEROLOGY PROCEDURE H&P NOTE   Primary Care Physician: Garlan Fillers, MD    Reason for Procedure:  History of Barrett's esophagus and colon polyps  Plan:    EGD and colonoscopy  Patient is appropriate for endoscopic procedure(s) in the ambulatory ([facility]) setting.  The nature of the procedure, as well as the risks, benefits, and alternatives were carefully and thoroughly reviewed with the patient. Ample time for discussion and questions allowed. The patient understood, was satisfied, and agreed to proceed.     HPI: Daniel Colon is a 78 y.o. male who presents for EGD and colonoscopy.  Medical history as below.  Tolerated the prep.  No recent chest pain or shortness of breath.  No abdominal pain today.  Past Medical History:  Diagnosis Date   Allergy    Arthritis    Barrett's esophagus    CAD (coronary artery disease)    a. Stent to the RCA 1996 (in [place]);  b. ETT(11/13/13): abnormal with early ST changes to suggest ischemia;   c. LHC (11/17/13):  pLAD 30-40, D1 30, mLAD 70-75, mCFX 70, mRCA stent 40 (ISR), EF 55-65%.  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 art   BILROTH II PROCEDURE     Related to gastric ulcer   CARDIAC CATHETERIZATION     CIRCUMCISION     COLONOSCOPY     CORONARY ARTERY BYPASS GRAFT N/A 04/27/2023   Procedure: CORONARY ARTERY BYPASS GRAFTING (CABG) X 4, USING LEFT INTERNAL MAMMARY ARTERY AND RIGHT LEG GREATER SAPHENOUS VEIN HARVESTED ENDOSCOPICALLY;   Surgeon: Corliss Skains, MD;  Location: [facility] OR;  Service: Open Heart Surgery;  Laterality: N/A;   CORONARY PRESSURE/FFR STUDY N/A 04/22/2023   Procedure: CORONARY PRESSURE/FFR STUDY;  Surgeon: Kathleene Hazel, MD;  Location: [facility] INVASIVE CV LAB;  Service: Cardiovascular;  Laterality: N/A;   INGUINAL HERNIA REPAIR Right 11/24/2023   Procedure: LAPAROSCOPIC RIGHT INGUINAL HERNIA REPAIR WITH MESH;  Surgeon: Axel Filler, MD;  Location: [facility] OR;  Service: General;  Laterality: Right;   INSERTION OF MESH Right 11/24/2023   Procedure: INSERTION OF MESH;  Surgeon: Axel Filler, MD;  Location: [facility] OR;  Service: General;  Laterality: Right;   INTERCOSTAL NERVE BLOCK Right 01/24/2020   Procedure: Intercostal Nerve Block;  Surgeon: Delight Ovens, MD;  Location: [facility] OR;  Service: Thoracic;  Laterality: Right;   KNEE ARTHROSCOPY Left    LEFT HEART CATH AND CORONARY ANGIOGRAPHY N/A 04/22/2023   Procedure: LEFT HEART CATH AND CORONARY ANGIOGRAPHY;  Surgeon: Kathleene Hazel, MD;  Location: [facility] INVASIVE CV LAB;  Service: Cardiovascular;  Laterality: N/A;   LEFT HEART CATHETERIZATION WITH CORONARY ANGIOGRAM N/A 11/17/2013   Procedure: LEFT HEART CATHETERIZATION WITH CORONARY ANGIOGRAM;  Surgeon: Micheline Chapman, MD;  Location: [facility] CATH LAB;  Service: Cardiovascular;  Laterality: N/A;   LUNG LOBECTOMY  01/2020  had chemo, no radtion   LYMPH NODE DISSECTION N/A 01/24/2020   Procedure: LYMPH NODE DISSECTION;  Surgeon: Delight Ovens, MD;  Location: [facility] OR;  Service: Thoracic;  Laterality: N/A;   NECK LESION BIOPSY     POLYPECTOMY     REFRACTIVE SURGERY     SKIN BIOPSY     Back   TEE WITHOUT CARDIOVERSION N/A 04/27/2023   Procedure: TRANSESOPHAGEAL ECHOCARDIOGRAM;  Surgeon: Corliss Skains, MD;  Location: [facility] OR;  Service: Open Heart Surgery;  Laterality: N/A;   Thigh surgery Left    Infant   TOTAL HIP ARTHROPLASTY Right 08/09/2023   Procedure: TOTAL HIP ARTHROPLASTY ANTERIOR  APPROACH;  Surgeon: Ollen Gross, MD;  Location: [facility] ORS;  Service: Orthopedics;  Laterality: Right;   ulcer surgery     ULNAR NERVE TRANSPOSITION Right 08/05/2017   Procedure: RIGHT ULNAR NERVE DECOMPRESSION;  Surgeon: Betha Loa, MD;  Location: [facility];  Service: Orthopedics;  Laterality: Right;   UPPER GI ENDOSCOPY     VIDEO BRONCHOSCOPY N/A 01/24/2020   Procedure: VIDEO BRONCHOSCOPY;  Surgeon: Delight Ovens, MD;  Location: [facility] OR;  Service: Thoracic;  Laterality: N/A;   WISDOM TOOTH EXTRACTION      Prior to Admission medications   Medication Sig Start Date End Date Taking? Authorizing Provider  albuterol (VENTOLIN HFA) 108 (90 Base) MCG/ACT inhaler Inhale 2 puffs into the lungs daily as needed for wheezing or shortness of breath (Depend on activity level). 11/19/20  Yes [provider]  aspirin EC 81 MG tablet Take 1 tablet (81 mg total) by mouth daily. Swallow whole. 10/14/23  Yes Rollene Rotunda, MD  atorvastatin (LIPITOR) 40 MG tablet Take 1 tablet (40 mg total) by mouth at bedtime. 05/05/23  Yes Rollene Rotunda, MD  BREZTRI AEROSPHERE 160-9-4.8 MCG/ACT AERO Inhale 2 puffs into the lungs in the morning and at bedtime. 06/25/20  Yes [provider]  ezetimibe (ZETIA) 10 MG tablet Take 10 mg by mouth at bedtime.   Yes [provider]  linaclotide (LINZESS) 290 MCG CAPS capsule Take 1 capsule (290 mcg total) by mouth daily before breakfast. 12/03/23  Yes Johnella Crumm, Carie Caddy, MD  metoprolol tartrate (LOPRESSOR) 25 MG tablet Take 1 tablet (25 mg total) by mouth 2 (two) times daily. 05/12/23  Yes Rollene Rotunda, MD  omeprazole (PRILOSEC) 10 MG capsule Take 10 mg by mouth at bedtime.   Yes [provider]  potassium chloride (KLOR-CON) 10 MEQ tablet Take 1 tablet (10 mEq total) by mouth daily. 11/22/23 02/20/24 Yes Rollene Rotunda, MD  torsemide (DEMADEX) 20 MG tablet Take 2 tablets (40 mg total) by mouth daily. Patient taking differently: Take  20 mg by mouth daily. 06/07/23 02/08/24 Yes Rollene Rotunda, MD  acetaminophen (TYLENOL) 650 MG CR tablet Take 1,300 mg by mouth every 8 (eight) hours as needed for pain (Mild pain).    [provider]  ascorbic acid (VITAMIN C) 500 MG tablet Take 500 mg by mouth daily.    [provider]  bisacodyl (DULCOLAX) 5 MG EC tablet Take 5 mg by mouth daily as needed for moderate constipation. Patient not taking: Reported on 02/04/2024    [provider]  Coenzyme Q10 (COQ10 PO) Take by mouth.    [provider]  docusate sodium (COLACE) 100 MG capsule Take 100 mg by mouth daily as needed for mild constipation. Patient not taking: Reported on 02/04/2024    [provider]  Fish Oil-Cholecalciferol (FISH OIL + D3 PO)  Take by mouth.    [provider]  fluorouracil (EFUDEX) 5 % cream Apply 1 Application topically daily as needed (Itch). 11/04/22   [provider]  GNP IRON 200 (65 Fe) MG TABS Take 200 mg by mouth at bedtime. 04/27/22   [provider]  Multiple Vitamin (MULTIVITAMIN) LIQD Take 5 mLs by mouth daily.    [provider]  nitroGLYCERIN (NITROSTAT) 0.4 MG SL tablet Place 0.4 mg under the tongue every 5 (five) minutes as needed for chest pain.    [provider]  polyethylene glycol (MIRALAX / GLYCOLAX) 17 g packet Take 17 g by mouth daily as needed for mild constipation. Patient not taking: Reported on 02/04/2024    [provider]  senna (SENOKOT) 8.6 MG tablet Take 1-2 tablets by mouth daily as needed for constipation. Patient not taking: Reported on 02/04/2024    [provider]  sildenafil (VIAGRA) 100 MG tablet Take 100 mg by mouth daily as needed for erectile dysfunction. 12/23/21   [provider]  traMADol (ULTRAM) 50 MG tablet Take 1 tablet (50 mg total) by mouth every 6 (six) hours as needed. Patient not taking: Reported on 02/04/2024 11/24/23 11/23/24  Axel Filler, MD     Current Outpatient Medications  Medication Sig Dispense Refill   albuterol (VENTOLIN HFA) 108 (90 Base) MCG/ACT inhaler Inhale 2 puffs into the lungs daily as needed for wheezing or shortness of breath (Depend on activity level).     aspirin EC 81 MG tablet Take 1 tablet (81 mg total) by mouth daily. Swallow whole.     atorvastatin (LIPITOR) 40 MG tablet Take 1 tablet (40 mg total) by mouth at bedtime. 90 tablet 3   BREZTRI AEROSPHERE 160-9-4.8 MCG/ACT AERO Inhale 2 puffs into the lungs in the morning and at bedtime.     ezetimibe (ZETIA) 10 MG tablet Take 10 mg by mouth at bedtime.     linaclotide (LINZESS) 290 MCG CAPS capsule Take 1 capsule (290 mcg total) by mouth daily before breakfast. 90 capsule 3   metoprolol tartrate (LOPRESSOR) 25 MG tablet Take 1 tablet (25 mg total) by mouth 2 (two) times daily. 180 tablet 3   omeprazole (PRILOSEC) 10 MG capsule Take 10 mg by mouth at bedtime.     potassium chloride (KLOR-CON) 10 MEQ tablet Take 1 tablet (10 mEq total) by mouth daily. 90 tablet 3   torsemide (DEMADEX) 20 MG tablet Take 2 tablets (40 mg total) by mouth daily. (Patient taking differently: Take 20 mg by mouth daily.) 60 tablet 3   acetaminophen (TYLENOL) 650 MG CR tablet Take 1,300 mg by mouth every 8 (eight) hours as needed for pain (Mild pain).     ascorbic acid (VITAMIN C) 500 MG tablet Take 500 mg by mouth daily.     bisacodyl (DULCOLAX) 5 MG EC tablet Take 5 mg by mouth daily as needed for moderate constipation. (Patient not taking: Reported on 02/04/2024)     Coenzyme Q10 (COQ10 PO) Take by mouth.     docusate sodium (COLACE) 100 MG capsule Take 100 mg by mouth daily as needed for mild constipation. (Patient not taking: Reported on 02/04/2024)     Fish Oil-Cholecalciferol (FISH OIL + D3 PO) Take by mouth.     fluorouracil (EFUDEX) 5 % cream Apply 1 Application topically daily as needed (Itch).     GNP IRON 200 (65 Fe) MG TABS Take 200 mg by mouth at bedtime.     Multiple  Vitamin (  MULTIVITAMIN) LIQD Take 5 mLs by mouth daily.     nitroGLYCERIN (NITROSTAT) 0.4 MG SL tablet Place 0.4 mg under the tongue every 5 (five) minutes as needed for chest pain.     polyethylene glycol (MIRALAX / GLYCOLAX) 17 g packet Take 17 g by mouth daily as needed for mild constipation. (Patient not taking: Reported on 02/04/2024)     senna (SENOKOT) 8.6 MG tablet Take 1-2 tablets by mouth daily as needed for constipation. (Patient not taking: Reported on 02/04/2024)     sildenafil (VIAGRA) 100 MG tablet Take 100 mg by mouth daily as needed for erectile dysfunction.     traMADol (ULTRAM) 50 MG tablet Take 1 tablet (50 mg total) by mouth every 6 (six) hours as needed. (Patient not taking: Reported on 02/04/2024) 20 tablet 0   Current Facility-Administered Medications  Medication Dose Route Frequency Provider Last Rate Last Admin   0.9 %  sodium chloride infusion  500 mL Intravenous Once Martyn Timme, Carie Caddy, MD        Allergies as of 02/08/2024   (No Known Allergies)    Family History  Problem Relation Age of Onset   Sudden death Mother 36   CAD Brother 79       CABG   CAD Sister 64       CABG   CAD Sister 75       CABG   Cancer Brother    Liver disease Other    Colon cancer Neg Hx    Colon polyps Neg Hx    Esophageal cancer Neg Hx    Rectal cancer Neg Hx    Stomach cancer Neg Hx     Social History   Socioeconomic History   Marital status: Married    Spouse name: Not on file   Number of children: 1   Years of education: Not on file   Highest education level: Not on file  Occupational History   Occupation: retired  Tobacco Use   Smoking status: Former    Current packs/day: 0.00    Average packs/day: 1 pack/day for 42.0 years (42.0 ttl pk-yrs)    Types: Cigarettes    Start date: 06/20/1969    Quit date: 06/21/2011    Years since quitting: 12.6   Smokeless tobacco: Former    Types: Associate Professor status: Never Used  Substance and Sexual Activity   Alcohol  use: Yes    Comment: occasionally   Drug use: No   Sexual activity: Yes  Other Topics Concern   Not on file  Social History Narrative   Lives with wife and mother in law.    Social Drivers of Corporate investment banker Strain: Not on file  Food Insecurity: No Food Insecurity (08/09/2023)   Hunger Vital Sign    Worried About Running Out of Food in the Last Year: Never true    Ran Out of Food in the Last Year: Never true  Transportation Needs: No Transportation Needs (08/09/2023)   PRAPARE - Administrator, Civil Service (Medical): No    Lack of Transportation (Non-Medical): No  Physical Activity: Not on file  Stress: Not on file  Social Connections: Not on file  Intimate Partner Violence: Not At Risk (08/09/2023)   Humiliation, Afraid, Rape, and Kick questionnaire    Fear of Current or Ex-Partner: No    Emotionally Abused: No    Physically Abused: No    Sexually Abused: No  Physical Exam: Vital signs in last 24 hours: @BP  (!) 121/58   Pulse (!) 50   Temp (!) 97.5 F (36.4 C)   SpO2 100%  GEN: NAD EYE: Sclerae anicteric ENT: MMM CV: Non-tachycardic Pulm: CTA b/l GI: Soft, NT/ND NEURO:  Alert & Oriented x 3   Erick Blinks, MD [facility]  02/08/2024 8:05 AM

## 2024-02-08 NOTE — Patient Instructions (Addendum)
Please read handouts provided. Await pathology results. Continue present medications. Resume previous diet. Fluconazole 200 mg for 1 day, then 100 mg for 13 days.  YOU HAD AN ENDOSCOPIC PROCEDURE TODAY AT THE [facility]:   Refer to the procedure report that was given to you for any specific questions about what was found during the examination.  If the procedure report does not answer your questions, please call your gastroenterologist to clarify.  If you requested that your care partner not be given the details of your procedure findings, then the procedure report has been included in a sealed envelope for you to review at your convenience later.  YOU SHOULD EXPECT: Some feelings of bloating in the abdomen. Passage of more gas than usual.  Walking can help get rid of the air that was put into your GI tract during the procedure and reduce the bloating. If you had a lower endoscopy (such as a colonoscopy or flexible sigmoidoscopy) you may notice spotting of blood in your stool or on the toilet paper. If you underwent a bowel prep for your procedure, you may not have a normal bowel movement for a few days.  Please Note:  You might notice some irritation and congestion in your nose or some drainage.  This is from the oxygen used during your procedure.  There is no need for concern and it should clear up in a day or so.  SYMPTOMS TO REPORT IMMEDIATELY:  Following lower endoscopy (colonoscopy or flexible sigmoidoscopy):  Excessive amounts of blood in the stool  Significant tenderness or worsening of abdominal pains  Swelling of the abdomen that is new, acute  Fever of 100F or higher  Following upper endoscopy (EGD)  Vomiting of blood or coffee ground material  New chest pain or pain under the shoulder blades  Painful or persistently difficult swallowing  New shortness of breath  Fever of 100F or higher  Black, tarry-looking stools  For urgent or emergent issues, a  gastroenterologist can be reached at any hour by calling [phone]. Do not use MyChart messaging for urgent concerns.    DIET:  We do recommend a small meal at first, but then you may proceed to your regular diet.  Drink plenty of fluids but you should avoid alcoholic beverages for 24 hours.  ACTIVITY:  You should plan to take it easy for the rest of today and you should NOT DRIVE or use heavy machinery until tomorrow (because of the sedation medicines used during the test).    FOLLOW UP: Our staff will call the number listed on your records the next business day following your procedure.  We will call around 7:15- 8:00 am to check on you and address any questions or concerns that you may have regarding the information given to you following your procedure. If we do not reach you, we will leave a message.     If any biopsies were taken you will be contacted by phone or by letter within the next 1-3 weeks.  Please call us at [phone] if you have not heard about the biopsies in 3 weeks.    SIGNATURES/CONFIDENTIALITY: You and/or your care partner have signed paperwork which will be entered into your electronic medical record.  These signatures attest to the fact that that the information above on your After Visit Summary has been reviewed and is understood.  Full responsibility of the confidentiality of this discharge information lies with you and/or your care-partner.

## 2024-02-08 NOTE — Progress Notes (Signed)
 Report to PACU, RN, vss, BBS= Clear.

## 2024-02-08 NOTE — Progress Notes (Signed)
 Called to room to assist during endoscopic procedure.  Patient ID and intended procedure confirmed with present staff. Received instructions for my participation in the procedure from the performing physician.

## 2024-02-08 NOTE — Progress Notes (Signed)
Pt's states no medical changes since previsit or office visit. States had left thumb surgery on December 21, 2023.

## 2024-02-08 NOTE — Op Note (Signed)
[facility] Patient Name: Daniel Colon Procedure Date: 02/08/2024 8:05 AM MRN: [id number] Endoscopist: Beverley Fiedler , MD, [id number] Age: 78 Referring MD:  Date of Birth: Apr 21, 1946 Gender: Male Account #: [id number] Procedure:                Colonoscopy Indications:              High risk colon cancer surveillance: Personal                            history of multiple adenomas, Last colonoscopy:                            December 2020 (TA x 3) Medicines:                Monitored Anesthesia Care Procedure:                Pre-Anesthesia Assessment:                           - Prior to the procedure, a History and Physical                            was performed, and patient medications and                            allergies were reviewed. The patient's tolerance of                            previous anesthesia was also reviewed. The risks                            and benefits of the procedure and the sedation                            options and risks were discussed with the patient.                            All questions were answered, and informed consent                            was obtained. Prior Anticoagulants: The patient has                            taken no anticoagulant or antiplatelet agents. ASA                            Grade Assessment: III - A patient with severe                            systemic disease. After reviewing the risks and                            benefits, the patient was deemed in satisfactory  condition to undergo the procedure.                           After obtaining informed consent, the colonoscope                            was passed under direct vision. Throughout the                            procedure, the patient's blood pressure, pulse, and                            oxygen saturations were monitored continuously. The                            CF [id number] #[id number] was introduced through  the anus                            and advanced to the cecum, identified by                            appendiceal orifice and ileocecal valve. The                            colonoscopy was performed without difficulty. The                            patient tolerated the procedure well. The quality                            of the bowel preparation was good. The ileocecal                            valve, appendiceal orifice, and rectum were                            photographed. Scope In: 8:23:50 AM Scope Out: 8:39:02 AM Scope Withdrawal Time: 0 hours 11 minutes 23 seconds  Total Procedure Duration: 0 hours 15 minutes 12 seconds  Findings:                 The digital rectal exam was normal.                           A 5 mm polyp was found in the sigmoid colon. The                            polyp was sessile. The polyp was removed with a                            cold snare. Resection and retrieval were complete.                           A 4 mm polyp was found in the rectum. The polyp was  sessile. The polyp was removed with a cold snare.                            Resection and retrieval were complete.                           Multiple small-mouthed diverticula were found in                            the sigmoid colon and descending colon.                           Internal hemorrhoids were found during                            retroflexion. The hemorrhoids were small. Complications:            No immediate complications. Estimated Blood Loss:     Estimated blood loss: none. Impression:               - One 5 mm polyp in the sigmoid colon, removed with                            a cold snare. Resected and retrieved.                           - One 4 mm polyp in the rectum, removed with a cold                            snare. Resected and retrieved.                           - Mild diverticulosis in the sigmoid colon and in                             the descending colon.                           - Small internal hemorrhoids. Recommendation:           - Patient has a contact number available for                            emergencies. The signs and symptoms of potential                            delayed complications were discussed with the                            patient. Return to normal activities tomorrow.                            Written discharge instructions were provided to the                            patient.                           -  Resume previous diet.                           - Continue present medications.                           - Await pathology results.                           - Repeat colonoscopy may be recommended for                            surveillance. The colonoscopy date will be                            determined after pathology results from today's                            exam become available for review. Beverley Fiedler, MD 02/08/2024 8:51:07 AM This report has been signed electronically.

## 2024-02-09 ENCOUNTER — Telehealth: Payer: Self-pay

## 2024-02-09 ENCOUNTER — Encounter: Payer: Self-pay | Admitting: Cardiology

## 2024-02-09 NOTE — Progress Notes (Signed)
Discharge Progress Report  Patient Details  Name: Daniel Colon MRN: [id number] Date of Birth: 1946/10/03 Referring Provider:   Flowsheet Row INTENSIVE CARDIAC REHAB ORIENT from 10/12/2023 in [facility]  Referring Provider Melany Guernsey        Number of Visits: 59 exercise and some educational offerings  Reason for Discharge:  Patient reached a stable level of exercise. Patient independent in their exercise. Patient has met program and personal goals.  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  ADL [facility]SD:   Initial Exercise Prescription:  Initial Exercise Prescription - 10/12/23 1100       Date of Initial Exercise RX and Referring Provider   Date 10/12/23    Referring Provider Melany Guernsey    Expected Discharge Date 01/05/24      Recumbant Bike   Level 1    RPM 60    Watts 25    Minutes 15    METs 2      NuStep   Level 1    SPM 75    Minutes 15    METs 2      Prescription Details   Frequency (times per week) 3    Duration Progress to 30 minutes of continuous aerobic without signs/symptoms of physical distress      Intensity   THRR 40-80% of Max Heartrate 58-116    Ratings of Perceived Exertion 11-13    Perceived Dyspnea 0-4      Progression   Progression Continue progressive overload as per policy without signs/symptoms or physical distress.      Resistance Training   Training Prescription Yes    Weight 3 lbs    Reps 10-15             Discharge Exercise Prescription (Final Exercise Prescription Changes):  Exercise Prescription Changes - 02/04/24 1655       Response to Exercise    Blood Pressure (Admit) 104/68    Blood Pressure (Exit) 110/70    Heart Rate (Admit) 88 bpm    Heart Rate (Exercise) 96 bpm    Heart Rate (Exit) 63 bpm    Rating of Perceived Exertion (Exercise) 8.5    Perceived Dyspnea (Exercise) 0    Symptoms none    Comments Patient graduated the [facility] ICR    Duration Progress to 30 minutes of  aerobic without signs/symptoms of physical distress    Intensity THRR unchanged      Progression   Progression Continue to progress workloads to maintain intensity without signs/symptoms of physical distress.    Average METs 4.93      Resistance Training   Training Prescription Yes    Weight 6 lbs wts    Reps 10-15    Time 10 Minutes      Treadmill   MPH 3.3    Grade 4    Minutes 15    METs 5.35      NuStep   Level 6    SPM 104    Minutes 15    METs 4.5      Home Exercise Plan  Plans to continue exercise at [facility] (comment)    Frequency Add 3 additional days to program exercise sessions.    Initial Home Exercises Provided 11/01/23             Functional Capacity:  6 Minute Walk     Row Name 10/12/23 0940 01/31/24 0824       6 Minute Walk   Phase Initial Discharge    Distance 1080 feet 1835 feet    Distance % Change -- 69.91 %    Distance Feet Change -- 785 ft    Walk Time 6 minutes 6 minutes    # of Rest Breaks 0 0    MPH 2.05 3.48    METS 2 3.52    RPE 11 11    Perceived Dyspnea  1 1    VO2 Peak 6.93 12.31    Symptoms Yes (comment) Yes (comment)    Comments SOB, RPD =1,  right thigh pain, chronic 3/10 SaO2, not reading at 4 min mark. breif pause to warm hands. Sao2 increased proceeded with walk test. low reading due to cold hands    Resting HR 56 bpm 83 bpm    Resting BP 118/60 122/62    Resting Oxygen Saturation  96 % 95 %    Exercise Oxygen Saturation  during 6 min walk 90 % 92 %    Max Ex. HR 64 bpm 82 bpm    Max Ex. BP 130/72 128/68    2 Minute Post BP 132/70 130/62      Interval HR   1 Minute  HR 57 76    2 Minute HR 60 82    3 Minute HR 61 80    4 Minute HR 61 82    5 Minute HR 62 87    6 Minute HR 63 --    2 Minute Post HR 62 62    Interval Heart Rate? Yes Yes      Interval Oxygen   Interval Oxygen? Yes --    Baseline Oxygen Saturation % 96 % --    1 Minute Oxygen Saturation % 96 % 95 %    1 Minute Liters of Oxygen 0 L 0 L    2 Minute Oxygen Saturation % 94 % 92 %    2 Minute Liters of Oxygen 0 L 0 L    3 Minute Oxygen Saturation % 92 % 88 %    3 Minute Liters of Oxygen 0 L 0 L    4 Minute Oxygen Saturation % 92 % 92 %    4 Minute Liters of Oxygen 0 L 0 L    5 Minute Oxygen Saturation % 90 % 94 %    5 Minute Liters of Oxygen 0 L 0 L    6 Minute Oxygen Saturation % 97 % 94 %    6 Minute Liters of Oxygen 0 L 0 L    2 Minute Post Oxygen Saturation % 100 % 100 %             Psychological, QOL, Others - Outcomes: PHQ 2/9:    02/04/2024   11:47 AM 10/12/2023    8:54 AM 08/08/2020    2:03 PM 06/05/2020   10:58 AM 06/05/2020   10:57 AM  Depression screen PHQ 2/9  Decreased Interest 0 0 0  0  Down, Depressed, Hopeless 0 0 0  0  PHQ - 2 Score 0 0 0  0  Altered sleeping 1 1 0  0   Tired, decreased energy 1 1 0 0   Change in appetite 0 0 0 0   Feeling bad or failure about yourself  0 0 0 0   Trouble concentrating 0 0 0 0   Moving slowly or fidgety/restless 1 0 0 0   Suicidal thoughts 0 0 0 0   PHQ-9 Score 3 2 0    Difficult doing work/chores Not difficult at all Not difficult at all Not difficult at all Not difficult at all     Quality of Life:  Quality of Life - 01/31/24 0953       Quality of Life   Select Quality of Life      Quality of Life Scores   Health/Function Post 20.33 %    Socioeconomic Post 23.25 %    Psych/Spiritual Post 24.58 %    Family Post 27.6 %    GLOBAL Post 22.81 %             Personal Goals: Goals established at orientation with interventions provided to work toward goal.  Personal Goals and Risk Factors at Admission -  10/12/23 1142       Core Components/Risk Factors/Patient Goals on Admission   Improve shortness of breath with ADL's Yes    Intervention Provide education, individualized exercise plan and daily activity instruction to help decrease symptoms of SOB with activities of daily living.    Expected Outcomes Short Term: Improve cardiorespiratory fitness to achieve a reduction of symptoms when performing ADLs;Long Term: Be able to perform more ADLs without symptoms or delay the onset of symptoms    Hypertension Yes    Intervention Provide education on lifestyle modifcations including regular physical activity/exercise, weight management, moderate sodium restriction and increased consumption of fresh fruit, vegetables, and low fat dairy, alcohol moderation, and smoking cessation.;Monitor prescription use compliance.    Expected Outcomes Short Term: Continued assessment and intervention until BP is < 140/25mm HG in hypertensive participants. < 130/47mm HG in hypertensive participants with diabetes, heart failure or chronic kidney disease.;Long Term: Maintenance of blood pressure at goal levels.    Lipids Yes    Intervention Provide education and support for participant on nutrition & aerobic/resistive exercise along with prescribed medications to achieve LDL 70mg , HDL >40mg .    Expected Outcomes Short Term: Participant states understanding of desired cholesterol values and is compliant with medications prescribed. Participant is following exercise prescription and nutrition guidelines.;Long Term: Cholesterol controlled with medications as prescribed, with individualized exercise RX and with personalized nutrition plan. Value goals: LDL < 70mg , HDL > 40 mg.              Personal Goals Discharge:  Goals and Risk Factor Review     Row Name 11/08/23 1733 11/23/23 1435 12/23/23 1049 01/19/24 0912 02/04/24 1153     Core Components/Risk Factors/Patient Goals Review   Personal Goals Review  Lipids;Hypertension;Improve shortness of breath with ADL's Lipids;Hypertension;Improve shortness of breath with ADL's Lipids;Hypertension;Improve shortness of breath with ADL's Lipids;Hypertension;Improve shortness of breath with ADL's Lipids;Hypertension;Improve shortness of breath with ADL's   Review Daniel Colon has been doing well with exercise. Vital signs have been stable Daniel Colon has been doing well with exercise. Vital signs have been stable. Daniel Colon is scheuled to have an inguinal hernia repair on 11/24/23/  laproscopic approach. Daniel Colon has been doing well with exercise. Vital signs have been stable. Daniel Colon has been doing well with exercise s/p hernia repair surgery. Daniel Colon will complete cardiac rehab on 01/05/24. Daniel Colon has been doing  well with exercise. Vital signs remain stable. Daniel Colon will complete cardiac rehab on 02/14 /25. Daniel Colon graduates today with the completion of 59 exercise and education classes.  Vital signs remained well within normal limits with no elevation noted, pt reports that his shortness of breath although chronic in nature has improved with the use of PLB technique. 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   Expected Outcomes Daniel Colon will continue to particpate in cardiac rehab for exercise, nutrtion and lifestyle modfications Daniel Colon will continue to particpate in cardiac rehab for exercise, nutrtion and lifestyle modfications Daniel Colon will continue to particpate in cardiac rehab for exercise, nutrtion and lifestyle modfications Daniel Colon will continue to particpate in cardiac rehab for exercise, nutrtion and lifestyle modfications Daniel Colon plans to continue exercise at [facility] for 60 minutes on Tuesdays, Thursdays and Saturdays, Golfing Mondays, Wednesdays and Firdays 4-5 hours of walking/riding. Will add another day for [employer] if inclimate weather.  Daniel Colon says, "rarely too cold or hot for golfing"!!  I anticipate Daniel Colon will continue to do well   as he has already done during his participation in  CR with his exercise.            Exercise Goals and Review:  Exercise Goals     Row Name 10/12/23 1145             Exercise Goals   Increase Physical Activity Yes       Intervention Provide advice, education, support and counseling about physical activity/exercise needs.;Develop an individualized exercise prescription for aerobic and resistive training based on initial evaluation findings, risk stratification, comorbidities and participant's personal goals.       Expected Outcomes Short Term: Attend rehab on a regular basis to increase amount of physical activity.;Long Term: Add in home exercise to make exercise part of routine and to increase amount of physical activity.;Long Term: Exercising regularly at least 3-5 days a week.       Increase Strength and Stamina Yes       Intervention Provide advice, education, support and counseling about physical activity/exercise needs.;Develop an individualized exercise prescription for aerobic and resistive training based on initial evaluation findings, risk stratification, comorbidities and participant's personal goals.       Expected Outcomes Short Term: Increase workloads from initial exercise prescription for resistance, speed, and METs.;Short Term: Perform resistance training exercises routinely during rehab and add in resistance training at home;Long Term: Improve cardiorespiratory fitness, muscular endurance and strength as measured by increased METs and functional capacity ( )       Able to understand and use rate of perceived exertion (RPE) scale Yes       Intervention Provide education and explanation on how to use RPE scale       Expected Outcomes Short Term: Able to use RPE daily in rehab to express subjective intensity level;Long Term:  Able to use RPE to guide intensity level when exercising independently       Able to understand and use Dyspnea scale Yes       Intervention  Provide education and explanation on how to use Dyspnea scale       Expected Outcomes Short Term: Able to use Dyspnea scale daily in rehab to express subjective sense of shortness of breath during exertion;Long Term: Able to use Dyspnea scale to guide intensity level when exercising independently       Knowledge and understanding of Target Heart Rate Range (THRR) Yes  Intervention Provide education and explanation of THRR including how the numbers were predicted and where they are located for reference       Expected Outcomes Short Term: Able to state/look up THRR;Long Term: Able to use THRR to govern intensity when exercising independently;Short Term: Able to use daily as guideline for intensity in rehab       Understanding of Exercise Prescription Yes       Intervention Provide education, explanation, and written materials on patient's individual exercise prescription       Expected Outcomes Short Term: Able to explain program exercise prescription;Long Term: Able to explain home exercise prescription to exercise independently                Exercise Goals Re-Evaluation:  Exercise Goals Re-Evaluation     Row Name 10/18/23 0835 11/01/23 0829 11/08/23 1430 12/08/23 0814 02/04/24 1657     Exercise Goal Re-Evaluation   Exercise Goals Review Increase Physical Activity;Understanding of Exercise Prescription;Increase Strength and Stamina;Knowledge and understanding of Target Heart Rate Range (THRR);Able to understand and use rate of perceived exertion (RPE) scale Increase Physical Activity;Understanding of Exercise Prescription;Increase Strength and Stamina;Knowledge and understanding of Target Heart Rate Range (THRR);Able to understand and use rate of perceived exertion (RPE) scale Increase Physical Activity;Understanding of Exercise Prescription;Increase Strength and Stamina;Knowledge and understanding of Target Heart Rate Range (THRR);Able to understand and use rate of perceived exertion  (RPE) scale Increase Physical Activity;Understanding of Exercise Prescription;Increase Strength and Stamina;Knowledge and understanding of Target Heart Rate Range (THRR);Able to understand and use rate of perceived exertion (RPE) scale Increase Physical Activity;Understanding of Exercise Prescription;Increase Strength and Stamina;Knowledge and understanding of Target Heart Rate Range (THRR);Able to understand and use rate of perceived exertion (RPE) scale   Comments Pt first day in the [facility] ICR program. Pt tolerated exercise well with an average MET level of 2.5. He is off to a good start and is learning his THRR, REP and ExRx Reviewed MET's, goals and home ExRx. Pt tolerated exercise well with an average MEt level of 3.35. Pt is doing well and increasing MET's, He feels good about his goals and is getting better control of his SOB and is increasing leg strength and stamina. Pt is already meeting ACSM guidlines and is going to [employer] and golfing 3 days for 30-60 mins. Reviewed MET's, and goals . Pt tolerated exercise well with an average MET level of 3.45. Pt is doing well breifly reviewed goals and he is feeling better already. He is feeling good with exercise on his own. Will give additional resourses on gradual weight lifting progression for his workouts at [employer] Reviewed MET's and goals. Pt tolerated exercise well with an average MET level of 4.4. Pt feels good about his goals and is increasing MET's. He feels in better control of his SOB and is increasing his SaO2. He is not yet cleared for retruning to golf, but is excited about returning soon. Pt graduated the pritikin icr program. Pt tolerated exercise well with an average MET level of 4.93. Pt did very well in the program and enjoyed his time. He increased his distance on his post by 730ft for a total of 1846ft. He also  decreased his weight and increased his balance. He will continue to exercise by going to [employer] and  golfing 60+ mins per session 5-7 days per week   Expected Outcomes Will continue to monitor pt and progress workloads as tolerated without sign or symptom Will continue to  monitor pt and progress workloads as tolerated without sign or symptom Will continue to monitor pt and progress workloads as tolerated without sign or symptom Will continue to monitor pt and progress workloads as tolerated without sign or symptom Will continue to monitor pt and progress workloads as tolerated without sign or symptom            Nutrition & Weight - Outcomes:  Pre Biometrics - 10/12/23 0910       Pre Biometrics   Waist Circumference 38 inches    Hip Circumference 40 inches    Waist to Hip Ratio 0.95 %    Triceps Skinfold 9 mm    % Body Fat 24.3 %    Grip Strength 38 kg    Flexibility 0 in    Single Leg Stand 4.6 seconds             Post Biometrics - 01/31/24 0836        Post  Biometrics   Height 5' 7.25" (1.708 m)    Weight 74.6 kg    Waist Circumference 36 inches    Hip Circumference 37.75 inches    Waist to Hip Ratio 0.95 %    BMI (Calculated) 25.57    Triceps Skinfold 9 mm    % Body Fat 23 %    Grip Strength 38 kg    Flexibility 38 in    Single Leg Stand 35 seconds             Nutrition:  Nutrition Therapy & Goals - 10/18/23 1159       Nutrition Therapy   Diet Heart Healthy Diet    Drug/Food Interactions Statins/Certain Fruits      Personal Nutrition Goals   Nutrition Goal Patient to identify strategies for reducing cardiovascular risk by attending the [facility] education and nutrition series weekly.    Personal Goal #2 Patient to improve diet quality by using the plate method as a guide for meal planning to include lean protein/plant protein, fruits, vegetables, whole grains, nonfat dairy as part of a well-balanced diet.    Personal Goal #3 Patient to reduce sodium to 1500mg  per day.    Comments Daniel Colon reports no nutrition concerns at this time. He has previously  completed cardiac rehab in 2021. He has medical history of CABG x4, CAD, hyperlipidemia, HTN, Barrett's Esophagus, COPD, lung cancer. Per cardiology notes on 09/08/23, his LDL is <55 (53) and well controlled. Patient will benefit from participation in intensive cardiac rehab for nutrition, exercise, and lifestyle modification.      Intervention Plan   Intervention Nutrition handout(s) given to patient.;Prescribe, educate and counsel regarding individualized specific dietary modifications aiming towards targeted core components such as weight, hypertension, lipid management, diabetes, heart failure and other comorbidities.    Expected Outcomes Short Term Goal: Understand basic principles of dietary content, such as calories, fat, sodium, cholesterol and nutrients.;Long Term Goal: Adherence to prescribed nutrition plan.             Nutrition Discharge:  Nutrition Assessments - 02/04/24 1704       Rate Your Plate Scores   Post Score 56             Education Questionnaire Score:  Knowledge Questionnaire Score - 01/31/24 1006       Knowledge Questionnaire Score   Post Score 24/24             Goals reviewed with patient.  Daniel Colon graduates from  Intensive cardiac rehab program. Pt  maintained good attendance and progressed nicely during their participation in rehab as evidenced by increased MET level.   Medication list reconciled.  Pt has made significant lifestyle changes and should be commended for their success.  Daniel Colon has achieved his goals during cardiac rehab. He has improved his lower body strength and although he continues to have shortness of breath he feels he is managing this better with pacing.   Pt plans to continue exercise at [facility] 60 minutes on Tuesdays, Thursdays and Saturdays and golf on Mondays, Wednesdays and Fridays 4-5 hours with walking/riding in between holes.  I anticipate with the eagerness Daniel Colon has shown during his participation he will continue to do  well.  He certainly will be missed. Alanson Aly, BSN Cardiac and Emergency planning/management officer

## 2024-02-09 NOTE — Telephone Encounter (Signed)
  Follow up Call-     02/08/2024    7:19 AM  Call back number  Post procedure Call Back phone  # [phone]  Permission to leave phone message Yes     Patient questions:  Do you have a fever, pain , or abdominal swelling? No. Pain Score  0 *  Have you tolerated food without any problems? Yes.    Have you been able to return to your normal activities? Yes.    Do you have any questions about your discharge instructions: Diet   No. Medications  No. Follow up visit  No.  Do you have questions or concerns about your Care? No.  Actions: * If pain score is 4 or above: No action needed, pain <4.

## 2024-02-10 DIAGNOSIS — K219 Gastro-esophageal reflux disease without esophagitis: Secondary | ICD-10-CM | POA: Diagnosis not present

## 2024-02-10 DIAGNOSIS — I739 Peripheral vascular disease, unspecified: Secondary | ICD-10-CM | POA: Diagnosis not present

## 2024-02-10 DIAGNOSIS — Z0000 Encounter for general adult medical examination without abnormal findings: Secondary | ICD-10-CM | POA: Diagnosis not present

## 2024-02-10 DIAGNOSIS — H9190 Unspecified hearing loss, unspecified ear: Secondary | ICD-10-CM | POA: Diagnosis not present

## 2024-02-10 DIAGNOSIS — G4700 Insomnia, unspecified: Secondary | ICD-10-CM | POA: Diagnosis not present

## 2024-02-10 DIAGNOSIS — J439 Emphysema, unspecified: Secondary | ICD-10-CM | POA: Diagnosis not present

## 2024-02-10 DIAGNOSIS — E785 Hyperlipidemia, unspecified: Secondary | ICD-10-CM | POA: Diagnosis not present

## 2024-02-10 DIAGNOSIS — I10 Essential (primary) hypertension: Secondary | ICD-10-CM | POA: Diagnosis not present

## 2024-02-10 DIAGNOSIS — Z85118 Personal history of other malignant neoplasm of bronchus and lung: Secondary | ICD-10-CM | POA: Diagnosis not present

## 2024-02-10 DIAGNOSIS — I2510 Atherosclerotic heart disease of native coronary artery without angina pectoris: Secondary | ICD-10-CM | POA: Diagnosis not present

## 2024-02-11 ENCOUNTER — Encounter: Payer: Self-pay | Admitting: Internal Medicine

## 2024-02-11 LAB — SURGICAL PATHOLOGY

## 2024-02-15 DIAGNOSIS — M25642 Stiffness of left hand, not elsewhere classified: Secondary | ICD-10-CM | POA: Diagnosis not present

## 2024-02-22 DIAGNOSIS — M25642 Stiffness of left hand, not elsewhere classified: Secondary | ICD-10-CM | POA: Diagnosis not present

## 2024-03-07 DIAGNOSIS — R3129 Other microscopic hematuria: Secondary | ICD-10-CM | POA: Diagnosis not present

## 2024-03-07 DIAGNOSIS — R400 Somnolence: Secondary | ICD-10-CM | POA: Diagnosis not present

## 2024-03-07 DIAGNOSIS — Z85118 Personal history of other malignant neoplasm of bronchus and lung: Secondary | ICD-10-CM | POA: Diagnosis not present

## 2024-03-07 DIAGNOSIS — R42 Dizziness and giddiness: Secondary | ICD-10-CM | POA: Diagnosis not present

## 2024-03-07 DIAGNOSIS — J439 Emphysema, unspecified: Secondary | ICD-10-CM | POA: Diagnosis not present

## 2024-03-07 DIAGNOSIS — I10 Essential (primary) hypertension: Secondary | ICD-10-CM | POA: Diagnosis not present

## 2024-03-07 DIAGNOSIS — I739 Peripheral vascular disease, unspecified: Secondary | ICD-10-CM | POA: Diagnosis not present

## 2024-03-07 DIAGNOSIS — I2510 Atherosclerotic heart disease of native coronary artery without angina pectoris: Secondary | ICD-10-CM | POA: Diagnosis not present

## 2024-03-07 DIAGNOSIS — R55 Syncope and collapse: Secondary | ICD-10-CM | POA: Diagnosis not present

## 2024-03-08 ENCOUNTER — Encounter (HOSPITAL_BASED_OUTPATIENT_CLINIC_OR_DEPARTMENT_OTHER): Payer: Self-pay | Admitting: Internal Medicine

## 2024-04-05 ENCOUNTER — Other Ambulatory Visit: Payer: Self-pay | Admitting: Cardiology

## 2024-04-05 ENCOUNTER — Other Ambulatory Visit (HOSPITAL_COMMUNITY): Payer: Self-pay

## 2024-04-05 ENCOUNTER — Other Ambulatory Visit: Payer: Self-pay

## 2024-04-05 MED ORDER — METOPROLOL TARTRATE 25 MG PO TABS
25.0000 mg | ORAL_TABLET | Freq: Two times a day (BID) | ORAL | 1 refills | Status: DC
  Filled 2024-04-05: qty 180, 90d supply, fill #0

## 2024-04-13 DIAGNOSIS — N401 Enlarged prostate with lower urinary tract symptoms: Secondary | ICD-10-CM | POA: Diagnosis not present

## 2024-04-13 DIAGNOSIS — R350 Frequency of micturition: Secondary | ICD-10-CM | POA: Diagnosis not present

## 2024-04-13 DIAGNOSIS — R3121 Asymptomatic microscopic hematuria: Secondary | ICD-10-CM | POA: Diagnosis not present

## 2024-04-13 DIAGNOSIS — N5201 Erectile dysfunction due to arterial insufficiency: Secondary | ICD-10-CM | POA: Diagnosis not present

## 2024-04-15 ENCOUNTER — Other Ambulatory Visit (HOSPITAL_COMMUNITY): Payer: Self-pay

## 2024-04-28 ENCOUNTER — Other Ambulatory Visit: Payer: Self-pay | Admitting: Cardiology

## 2024-06-15 DIAGNOSIS — I5030 Unspecified diastolic (congestive) heart failure: Secondary | ICD-10-CM | POA: Diagnosis not present

## 2024-06-15 DIAGNOSIS — Z85118 Personal history of other malignant neoplasm of bronchus and lung: Secondary | ICD-10-CM | POA: Diagnosis not present

## 2024-06-15 DIAGNOSIS — K219 Gastro-esophageal reflux disease without esophagitis: Secondary | ICD-10-CM | POA: Diagnosis not present

## 2024-06-15 DIAGNOSIS — J439 Emphysema, unspecified: Secondary | ICD-10-CM | POA: Diagnosis not present

## 2024-06-15 DIAGNOSIS — D509 Iron deficiency anemia, unspecified: Secondary | ICD-10-CM | POA: Diagnosis not present

## 2024-06-15 DIAGNOSIS — R0609 Other forms of dyspnea: Secondary | ICD-10-CM | POA: Diagnosis not present

## 2024-06-15 DIAGNOSIS — G4700 Insomnia, unspecified: Secondary | ICD-10-CM | POA: Diagnosis not present

## 2024-06-15 DIAGNOSIS — C3411 Malignant neoplasm of upper lobe, right bronchus or lung: Secondary | ICD-10-CM | POA: Diagnosis not present

## 2024-06-15 DIAGNOSIS — I2510 Atherosclerotic heart disease of native coronary artery without angina pectoris: Secondary | ICD-10-CM | POA: Diagnosis not present

## 2024-06-15 DIAGNOSIS — R55 Syncope and collapse: Secondary | ICD-10-CM | POA: Diagnosis not present

## 2024-06-16 DIAGNOSIS — D649 Anemia, unspecified: Secondary | ICD-10-CM | POA: Diagnosis not present

## 2024-06-29 ENCOUNTER — Other Ambulatory Visit: Payer: Self-pay | Admitting: Cardiology

## 2024-06-29 DIAGNOSIS — I5031 Acute diastolic (congestive) heart failure: Secondary | ICD-10-CM

## 2024-07-18 DIAGNOSIS — G4733 Obstructive sleep apnea (adult) (pediatric): Secondary | ICD-10-CM | POA: Diagnosis not present

## 2024-07-25 NOTE — Procedures (Signed)
 Error

## 2024-07-27 ENCOUNTER — Other Ambulatory Visit: Payer: Self-pay | Admitting: Cardiology

## 2024-08-15 DIAGNOSIS — E785 Hyperlipidemia, unspecified: Secondary | ICD-10-CM | POA: Diagnosis not present

## 2024-08-15 DIAGNOSIS — I2510 Atherosclerotic heart disease of native coronary artery without angina pectoris: Secondary | ICD-10-CM | POA: Diagnosis not present

## 2024-08-15 DIAGNOSIS — I10 Essential (primary) hypertension: Secondary | ICD-10-CM | POA: Diagnosis not present

## 2024-08-15 DIAGNOSIS — D509 Iron deficiency anemia, unspecified: Secondary | ICD-10-CM | POA: Diagnosis not present

## 2024-08-15 DIAGNOSIS — G4719 Other hypersomnia: Secondary | ICD-10-CM | POA: Diagnosis not present

## 2024-08-15 DIAGNOSIS — K219 Gastro-esophageal reflux disease without esophagitis: Secondary | ICD-10-CM | POA: Diagnosis not present

## 2024-08-15 DIAGNOSIS — Z85118 Personal history of other malignant neoplasm of bronchus and lung: Secondary | ICD-10-CM | POA: Diagnosis not present

## 2024-08-15 DIAGNOSIS — Z902 Acquired absence of lung [part of]: Secondary | ICD-10-CM | POA: Diagnosis not present

## 2024-08-15 DIAGNOSIS — J4489 Other specified chronic obstructive pulmonary disease: Secondary | ICD-10-CM | POA: Diagnosis not present

## 2024-08-15 DIAGNOSIS — Z951 Presence of aortocoronary bypass graft: Secondary | ICD-10-CM | POA: Diagnosis not present

## 2024-08-15 DIAGNOSIS — R0683 Snoring: Secondary | ICD-10-CM | POA: Diagnosis not present

## 2024-08-23 ENCOUNTER — Other Ambulatory Visit: Payer: Self-pay | Admitting: Cardiology

## 2024-08-24 ENCOUNTER — Encounter: Payer: Self-pay | Admitting: Cardiology

## 2024-08-28 ENCOUNTER — Other Ambulatory Visit: Payer: Medicare Other

## 2024-09-04 ENCOUNTER — Ambulatory Visit: Payer: Medicare Other | Admitting: Internal Medicine

## 2024-09-06 ENCOUNTER — Other Ambulatory Visit: Payer: Self-pay | Admitting: Cardiology

## 2024-09-06 DIAGNOSIS — I5031 Acute diastolic (congestive) heart failure: Secondary | ICD-10-CM

## 2024-09-11 ENCOUNTER — Ambulatory Visit (HOSPITAL_COMMUNITY)
Admission: RE | Admit: 2024-09-11 | Discharge: 2024-09-11 | Disposition: A | Discharge status: Home / Self Care | Source: Ambulatory Visit | Attending: Internal Medicine | Admitting: Internal Medicine

## 2024-09-11 ENCOUNTER — Inpatient Hospital Stay: Attending: Physician Assistant

## 2024-09-11 DIAGNOSIS — C3490 Malignant neoplasm of unspecified part of unspecified bronchus or lung: Secondary | ICD-10-CM

## 2024-09-11 DIAGNOSIS — D3501 Benign neoplasm of right adrenal gland: Secondary | ICD-10-CM | POA: Diagnosis not present

## 2024-09-11 DIAGNOSIS — I700 Atherosclerosis of aorta: Secondary | ICD-10-CM | POA: Diagnosis not present

## 2024-09-11 DIAGNOSIS — Z85118 Personal history of other malignant neoplasm of bronchus and lung: Secondary | ICD-10-CM | POA: Diagnosis not present

## 2024-09-11 LAB — CBC WITH DIFFERENTIAL (CANCER CENTER ONLY)
Abs Immature Granulocytes: 0.01 K/uL (ref 0.00–0.07)
Basophils Absolute: 0 K/uL (ref 0.0–0.1)
Basophils Relative: 0 %
Eosinophils Absolute: 0.1 K/uL (ref 0.0–0.5)
Eosinophils Relative: 1 %
HCT: 37.9 % — ABNORMAL LOW (ref 39.0–52.0)
Hemoglobin: 12.5 g/dL — ABNORMAL LOW (ref 13.0–17.0)
Immature Granulocytes: 0 %
Lymphocytes Relative: 22 %
Lymphs Abs: 1.5 K/uL (ref 0.7–4.0)
MCH: 26.5 pg (ref 26.0–34.0)
MCHC: 33 g/dL (ref 30.0–36.0)
MCV: 80.5 fL (ref 80.0–100.0)
Monocytes Absolute: 0.6 K/uL (ref 0.1–1.0)
Monocytes Relative: 8 %
Neutro Abs: 4.8 K/uL (ref 1.7–7.7)
Neutrophils Relative %: 69 %
Platelet Count: 181 K/uL (ref 150–400)
RBC: 4.71 MIL/uL (ref 4.22–5.81)
RDW: 15.1 % (ref 11.5–15.5)
WBC Count: 7 K/uL (ref 4.0–10.5)
nRBC: 0 % (ref 0.0–0.2)

## 2024-09-11 LAB — CMP (CANCER CENTER ONLY)
ALT: 34 U/L (ref 0–44)
AST: 26 U/L (ref 15–41)
Albumin: 4.3 g/dL (ref 3.5–5.0)
Alkaline Phosphatase: 75 U/L (ref 38–126)
Anion gap: 6 (ref 5–15)
BUN: 28 mg/dL — ABNORMAL HIGH (ref 8–23)
CO2: 31 mmol/L (ref 22–32)
Calcium: 8.9 mg/dL (ref 8.9–10.3)
Chloride: 101 mmol/L (ref 98–111)
Creatinine: 1.23 mg/dL (ref 0.61–1.24)
GFR, Estimated: >60 mL/min (ref >60)
Glucose, Bld: 101 mg/dL — ABNORMAL HIGH (ref 70–99)
Potassium: 3.4 mmol/L — ABNORMAL LOW (ref 3.5–5.1)
Sodium: 138 mmol/L (ref 135–145)
Total Bilirubin: 0.7 mg/dL (ref 0.0–1.2)
Total Protein: 6.4 g/dL — ABNORMAL LOW (ref 6.5–8.1)

## 2024-09-11 MED ORDER — IOHEXOL 300 MG/ML  SOLN
75.0000 mL | Freq: Once | INTRAMUSCULAR | Status: AC | PRN
Start: 2024-09-11 — End: 2024-09-11
  Administered 2024-09-11: 75 mL via INTRAVENOUS

## 2024-09-11 MED ORDER — SODIUM CHLORIDE (PF) 0.9 % IJ SOLN
INTRAMUSCULAR | Status: AC
Start: 2024-09-11 — End: 2024-09-11
  Filled 2024-09-11: qty 50

## 2024-09-13 ENCOUNTER — Other Ambulatory Visit: Payer: Self-pay | Admitting: Internal Medicine

## 2024-09-13 ENCOUNTER — Other Ambulatory Visit: Payer: Self-pay | Admitting: Cardiology

## 2024-09-14 DIAGNOSIS — Z85118 Personal history of other malignant neoplasm of bronchus and lung: Secondary | ICD-10-CM | POA: Diagnosis not present

## 2024-09-14 DIAGNOSIS — Z951 Presence of aortocoronary bypass graft: Secondary | ICD-10-CM | POA: Diagnosis not present

## 2024-09-14 DIAGNOSIS — J4489 Other specified chronic obstructive pulmonary disease: Secondary | ICD-10-CM | POA: Diagnosis not present

## 2024-09-14 DIAGNOSIS — I5030 Unspecified diastolic (congestive) heart failure: Secondary | ICD-10-CM | POA: Diagnosis not present

## 2024-09-14 DIAGNOSIS — R2681 Unsteadiness on feet: Secondary | ICD-10-CM | POA: Diagnosis not present

## 2024-09-14 DIAGNOSIS — I739 Peripheral vascular disease, unspecified: Secondary | ICD-10-CM | POA: Diagnosis not present

## 2024-09-14 DIAGNOSIS — M25462 Effusion, left knee: Secondary | ICD-10-CM | POA: Diagnosis not present

## 2024-09-18 DIAGNOSIS — Z23 Encounter for immunization: Secondary | ICD-10-CM | POA: Diagnosis not present

## 2024-09-19 NOTE — Progress Notes (Signed)
 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 Army. 2) Adjuvant systemic chemotherapy with carboplatin  for AUC of 6 and paclitaxel  200 mg/M2 every 3 weeks with Neulasta  support.  First dose February 28, 2020.  Status post 4 cycles.  Last dose was given May 01, 2020.  CURRENT THERAPY: Observation   INTERVAL HISTORY: Daniel Colon 78 y.o. male returns to the clinic today for a follow-up visit.  The patient was last seen in the clinic 1 year ago by Dr. Sherrod.  The patient is on observation for his history of stage II non-small cell lung cancer.  He is status post surgery followed by 4 cycles of adjuvant treatment.  He has been feeling fairly well. However, he had open heart surgery this year and he thinks he is recovering slower from that. It sounds like he is expected to have an angiogram later this week. He also tells me he had endoscopy and colonoscopy this year by Dr. Albertus. He is now taking Prilosec for Barrett's esophagitis. They also gave him Linzess  for constipation which is helpful.   He exercises several days per week.   Denies any fever, chills, night sweats, unusual chest discomfort, or hemoptysis.  He reports stable dyspnea on exertion.  Denies any nausea, vomiting, or diarrhea. He denies any headache or visual changes.  The patient recently had a restaging CT scan.  He is here today for evaluation and to review his scan results.  MEDICAL HISTORY: Past Medical History:  Diagnosis Date   Allergy    Arthritis    Barrett's esophagus    CAD (coronary artery disease)    a. Stent to the RCA 1996 (in [place] );  b. ETT(11/13/13): abnormal with early ST changes to suggest ischemia;   c. LHC (11/17/13):   pLAD 30-40, D1 30, mLAD 70-75, mCFX 70, mRCA stent 40 (ISR), EF 55-65%.  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  MEDICATIONS:  Current Outpatient Medications  Medication Sig Dispense Refill   acetaminophen  (TYLENOL ) 650 MG CR tablet Take 1,300 mg by mouth every 8 (eight) hours as needed for pain (Mild pain).     albuterol  (VENTOLIN  HFA) 108 (90 Base) MCG/ACT inhaler Inhale 2 puffs into the lungs daily as needed for wheezing or shortness of breath (Depend on activity level).     aspirin  EC 81 MG tablet Take 1 tablet (81 mg total) by mouth daily. Swallow whole. (Patient taking differently: Take 81 mg by mouth once a week. Takes 3x weekly on Tuesday, Thursday, and Sunday.)     atorvastatin  (LIPITOR) 40 MG tablet Take 1 tablet (40 mg total) by mouth daily. 90 tablet 3   bisacodyl  (  DULCOLAX) 5 MG EC tablet Take 5 mg by mouth daily as needed for moderate constipation.     BREZTRI AEROSPHERE 160-9-4.8 MCG/ACT AERO Inhale 2 puffs into the lungs in the morning and at bedtime.     docusate sodium  (COLACE) 100 MG capsule Take 100 mg by mouth daily as needed for mild constipation.     ezetimibe  (ZETIA ) 10 MG tablet Take 10 mg by mouth at bedtime.     GNP IRON 200 (65 Fe) MG TABS Take 200 mg by mouth at bedtime. (Patient taking differently: Take 200 mg by mouth once a week. Patient takes 3x times weekly on Tuesday, Thursday, and Sunday)     linaclotide  (LINZESS ) 290 MCG CAPS capsule TAKE 1 CAPSULE DAILY BEFORE BREAKFAST 90 capsule 1   metoprolol  tartrate (LOPRESSOR ) 25 MG  tablet Take 1 tablet (25 mg total) by mouth 2 (two) times daily. 180 tablet 3   nitroGLYCERIN  (NITROSTAT ) 0.4 MG SL tablet Place 0.4 mg under the tongue every 5 (five) minutes as needed for chest pain.     omeprazole (PRILOSEC) 10 MG capsule Take 10 mg by mouth at bedtime.     polyethylene glycol (MIRALAX  / GLYCOLAX ) 17 g packet Take 17 g by mouth daily as needed for mild constipation.     potassium chloride  (KLOR-CON ) 10 MEQ tablet Take 1 tablet (10 mEq total) by mouth daily. (Patient taking differently: Take 10 mEq by mouth 2 (two) times daily.) 90 tablet 3   senna (SENOKOT) 8.6 MG tablet Take 1-2 tablets by mouth daily as needed for constipation.     sildenafil (VIAGRA) 100 MG tablet Take 100 mg by mouth daily as needed for erectile dysfunction.     torsemide  (DEMADEX ) 20 MG tablet Take 2 tablets (40 mg total) by mouth daily. (Patient taking differently: Take 40 mg by mouth 2 (two) times daily. Patient takes 40 mg in the morning and 20 at night) 180 tablet 3   traMADol  (ULTRAM ) 50 MG tablet Take 1 tablet (50 mg total) by mouth every 6 (six) hours as needed. 20 tablet 0   ascorbic acid (VITAMIN C) 500 MG tablet Take 500 mg by mouth daily. (Patient not taking: Reported on 09/21/2024)     Coenzyme Q10 (COQ10 PO) Take by mouth. (Patient not taking: Reported on 09/21/2024)     Fish Oil-Cholecalciferol (FISH OIL + D3 PO) Take by mouth. (Patient not taking: Reported on 09/21/2024)     fluconazole  (DIFLUCAN ) 100 MG tablet Fluconazole  take 200 mg for 1 day, then 100 mg for 13 days. (Patient not taking: Reported on 09/21/2024) 16 tablet 0   fluorouracil (EFUDEX) 5 % cream Apply 1 Application topically daily as needed (Itch). (Patient not taking: Reported on 09/21/2024)     Multiple Vitamin (MULTIVITAMIN) LIQD Take 5 mLs by mouth daily. (Patient not taking: Reported on 09/21/2024)     No current facility-administered medications for this visit.    SURGICAL HISTORY:  Past Surgical History:  Procedure  Laterality Date   ANGIOPLASTY     stent - rt cor. art   BILROTH II PROCEDURE     Related to gastric ulcer   CARDIAC CATHETERIZATION     CIRCUMCISION     COLONOSCOPY     COLONOSCOPY WITH ESOPHAGOGASTRODUODENOSCOPY (EGD)  02/08/2024   Gordy Starch at [facility]   CORONARY ARTERY BYPASS GRAFT N/A 04/27/2023   Procedure: CORONARY ARTERY BYPASS GRAFTING (CABG) X 4, USING LEFT INTERNAL MAMMARY ARTERY AND RIGHT LEG GREATER SAPHENOUS VEIN HARVESTED ENDOSCOPICALLY;  Surgeon:  Shyrl Linnie KIDD, MD;  Location: [facility] OR;  Service: Open Heart Surgery;  Laterality: N/A;   CORONARY PRESSURE/FFR STUDY N/A 04/22/2023   Procedure: CORONARY PRESSURE/FFR STUDY;  Surgeon: Verlin Lonni BIRCH, MD;  Location: [facility] INVASIVE CV LAB;  Service: Cardiovascular;  Laterality: N/A;   INGUINAL HERNIA REPAIR Right 11/24/2023   Procedure: LAPAROSCOPIC RIGHT INGUINAL HERNIA REPAIR WITH MESH;  Surgeon: Rubin Calamity, MD;  Location: [facility] OR;  Service: General;  Laterality: Right;   INSERTION OF MESH Right 11/24/2023   Procedure: INSERTION OF MESH;  Surgeon: Rubin Calamity, MD;  Location[facility] OR;  Service: General;  Laterality: Right;   INTERCOSTAL NERVE BLOCK Right 01/24/2020   Procedure: Intercostal Nerve Block;  Surgeon: Army Dallas NOVAK, MD;  Location: [facility] OR;  Service: Thoracic;  Laterality: Right;   KNEE ARTHROSCOPY Left    LEFT HEART CATH AND CORONARY ANGIOGRAPHY N/A 04/22/2023   Procedure: LEFT HEART CATH AND CORONARY ANGIOGRAPHY;  Surgeon: Verlin Lonni BIRCH, MD;  Location: [facility] INVASIVE CV LAB;  Service: Cardiovascular;  Laterality: N/A;   LEFT HEART CATHETERIZATION WITH CORONARY ANGIOGRAM N/A 11/17/2013   Procedure: LEFT HEART CATHETERIZATION WITH CORONARY ANGIOGRAM;  Surgeon: Ozell BIRCH Fell, MD;  Location: [facility] CATH LAB;  Service: Cardiovascular;  Laterality: N/A;   LUNG LOBECTOMY  01/2020   had chemo, no radtion   LYMPH NODE DISSECTION N/A 01/24/2020   Procedure: LYMPH NODE DISSECTION;  Surgeon: Army Dallas NOVAK, MD;  Location: [facility] OR;  Service: Thoracic;  Laterality: N/A;   NECK LESION BIOPSY     POLYPECTOMY     REFRACTIVE SURGERY     SKIN BIOPSY     Back   TEE WITHOUT CARDIOVERSION N/A 04/27/2023   Procedure: TRANSESOPHAGEAL ECHOCARDIOGRAM;  Surgeon: Shyrl Linnie KIDD, MD;  Location: [facility] OR;  Service: Open Heart Surgery;  Laterality: N/A;   Thigh surgery Left    Infant   TOTAL HIP ARTHROPLASTY Right 08/09/2023   Procedure: TOTAL HIP ARTHROPLASTY ANTERIOR APPROACH;  Surgeon: Melodi Lerner, MD;  Location: [facility] ORS;  Service: Orthopedics;  Laterality: Right;   ulcer surgery     ULNAR NERVE TRANSPOSITION Right 08/05/2017   Procedure: RIGHT ULNAR NERVE DECOMPRESSION;  Surgeon: Murrell Drivers, MD;  Location: [facility];  Service: Orthopedics;  Laterality: Right;   UPPER GI ENDOSCOPY     VIDEO BRONCHOSCOPY N/A 01/24/2020   Procedure: VIDEO BRONCHOSCOPY;  Surgeon: Army Dallas NOVAK, MD;  Location: [facility] OR;  Service: Thoracic;  Laterality: N/A;   WISDOM TOOTH EXTRACTION      REVIEW OF SYSTEMS:   Review of Systems  Constitutional: Negative for appetite change, chills, fatigue, fever and unexpected weight change.  HENT: Negative for mouth sores, nosebleeds, sore throat and trouble swallowing.   Eyes: Negative for eye problems and icterus.  Respiratory: Positive for stable dyspnea on exertion. Negative for cough, hemoptysis, and wheezing.   Cardiovascular: Negative for chest pain and leg swelling.  Gastrointestinal: Negative for abdominal pain, constipation, diarrhea, nausea and vomiting.  Genitourinary: Negative for bladder incontinence, difficulty urinating, dysuria, frequency and hematuria.   Musculoskeletal: Negative for back pain, gait problem, neck pain and neck stiffness.  Skin: Negative for itching and rash.  Neurological: Negative for dizziness, extremity weakness, gait problem, headaches, light-headedness and seizures.  Hematological: Negative for adenopathy. Does not  bruise/bleed easily.  Psychiatric/Behavioral: Negative for confusion, depression and sleep disturbance. The patient is not nervous/anxious.     PHYSICAL EXAMINATION:  Blood pressure 134/82, pulse (!) 55, temperature 97.9 F (36.6 C), temperature  source Temporal, resp. rate 15, weight 164 lb 3.2 oz (74.5 kg), SpO2 100%.  ECOG PERFORMANCE STATUS: 1  Physical Exam  Constitutional: Oriented to person, place, and time and well-developed, well-nourished, and in no distress.  HENT:  Head: Normocephalic and atraumatic.  Mouth/Throat: Oropharynx is clear and moist. No oropharyngeal exudate.  Eyes: Conjunctivae are normal. Right eye exhibits no discharge. Left eye exhibits no discharge. No scleral icterus.  Neck: Normal range of motion. Neck supple.  Cardiovascular: Normal rate, regular rhythm, normal heart sounds and intact distal pulses.   Pulmonary/Chest: Effort normal and breath sounds normal. No respiratory distress. No wheezes. No rales.  Abdominal: Soft. Bowel sounds are normal. Exhibits no distension and no mass. There is no tenderness.  Musculoskeletal: Normal range of motion. Exhibits no edema.  Lymphadenopathy:    No cervical adenopathy.  Neurological: Alert and oriented to person, place, and time. Exhibits normal muscle tone. Gait normal. Coordination normal.  Skin: Skin is warm and dry. No rash noted. Not diaphoretic. No erythema. No pallor.  Psychiatric: Mood, memory and judgment normal.  Vitals reviewed.  LABORATORY DATA: Lab Results  Component Value Date   WBC 7.0 09/11/2024   HGB 12.5 (L) 09/11/2024   HCT 37.9 (L) 09/11/2024   MCV 80.5 09/11/2024   PLT 181 09/11/2024      Chemistry      Component Value Date/Time   NA 138 09/11/2024 1505   NA 144 07/01/2023 0830   K 3.4 (L) 09/11/2024 1505   CL 101 09/11/2024 1505   CO2 31 09/11/2024 1505   BUN 28 (H) 09/11/2024 1505   BUN 18 07/01/2023 0830   CREATININE 1.23 09/11/2024 1505   CREATININE 0.98 05/18/2023 1240       Component Value Date/Time   CALCIUM  8.9 09/11/2024 1505   ALKPHOS 75 09/11/2024 1505   AST 26 09/11/2024 1505   ALT 34 09/11/2024 1505   BILITOT 0.7 09/11/2024 1505       RADIOGRAPHIC STUDIES:  CT Chest W Contrast Result Date: 09/11/2024 CLINICAL DATA:  Non-small cell lung cancer EXAM: CT CHEST WITH CONTRAST TECHNIQUE: Multidetector CT imaging of the chest was performed during intravenous contrast administration. RADIATION DOSE REDUCTION: This exam was performed according to the departmental dose-optimization program which includes automated exposure control, adjustment of the mA and/or kV according to patient size and/or use of iterative reconstruction technique. CONTRAST:  75mL OMNIPAQUE  IOHEXOL  300 MG/ML  SOLN COMPARISON:  Chest CT September 02, 2023, CT ABDOMEN PELVIS June 25, 2023 FINDINGS: Cardiovascular: The heart size is normal. Atherosclerotic calcifications of coronary arteries and aorta. No pericardial fluid. Status post CABG. Mediastinum/Nodes: No enlarged mediastinal, hilar, or axillary lymph nodes. Thyroid  gland, trachea, and esophagus demonstrate no significant findings. Lungs/Pleura: Right upper lobectomy without suspicious finding to suggest recurrent malignancy at the surgical stump. There is expected scarring and mediastinal shift to the right, stable to prior. No suspicious findings to suggest local recurrence. Scattered calcified pulmonary micro nodules for example left lower lobes, lingula (6/91 and 90), left upper lobe 6/38,. There is a right lower linear scarring with a nodule stable to prior (6/98). No new pulmonary nodules. No pleural effusion. Upper Abdomen: Right adrenal adenoma measuring 1.8 cm stable to prior exam from 2024 and better assessed on dedicated CT abdomen pelvis. Otherwise upper abdomen is unremarkable. Musculoskeletal:  No suspicious osseous lesion. IMPRESSION: Right upper lobectomy without suspicious findings to suggest recurrent malignancy, nodal or distant  metastasis. Stable calcified pulmonary micro nodules. No suspicious or new pulmonary  nodule. Atherosclerotic calcifications of coronary arteries and aorta. Stable right adrenal adenoma. Electronically Signed   By: Megan  Zare M.D.   On: 09/11/2024 17:31     ASSESSMENT/PLAN:  This is a very pleasant 78 year old Caucasian male with history of stage IIb non-small cell lung cancer with sarcomatoid features.  He was diagnosed in February 2021.  He is status post right upper lobectomy with lymph node dissection under the care of Dr. Army.  He then underwent 4 cycles of adjuvant systemic chemotherapy with carboplatin  and paclitaxel .  This was completed in May 2021.  He has been on observation since that time and feeling well.  The patient was seen with Dr. Sherrod today.  Dr. Sherrod personally and independently reviewed the scan and discussed results with the patient today.  The scan showed no evidence for disease progression.  Dr. Sherrod recommends that the patient continue on observation with a repeat CT scan of the chest in 1 year.  We will see him back a few days after the scan to review the results in the office.  The patient was advised to call immediately if she has any concerning symptoms in the interval. The patient voices understanding of current disease status and treatment options and is in agreement with the current care plan. All questions were answered. The patient knows to call the clinic with any problems, questions or concerns. We can certainly see the patient much sooner if necessary   Orders Placed This Encounter  Procedures   CT Chest W Contrast    Standing Status:   Future    Expected Date:   09/11/2025    Expiration Date:   12/10/2025    If indicated for the ordered procedure, I authorize the administration of contrast media per Radiology protocol:   Yes    Does the patient have a contrast media/X-ray dye allergy?:   No    Preferred imaging location?:   [facility]      Norwin Aleman L Venicia Vandall, PA-C 09/26/24  ADDENDUM: Hematology/Oncology Attending:  I had a face-to-face encounter with the patient today.  I reviewed his record, lab, scan and recommended his care plan.  This is a very pleasant 78 years old white male with a stage IIb non-small cell lung cancer with sarcomatoid features diagnosed in February 2021 status post right upper lobectomy with lymph node dissection followed by 4 cycles of adjuvant systemic chemotherapy with carboplatin  and paclitaxel  last dose was [place] in May 2021 and he has been in observation since that time.  The patient had a recent coronary artery bypass surgery by Dr. Shyrl. He had repeat CT scan of the chest performed recently.  I personally and independently reviewed the scan and discussed the result with the patient today.  His scan showed no concerning findings for disease recurrence or metastasis. I recommended for him to continue on observation with repeat CT scan of the chest in 1 year. The patient was advised to call immediately if he has any other concerning symptoms in the interval. The total time spent in the appointment was 20 minutes including review of chart and various tests results, discussions about plan of care and coordination of care plan . Disclaimer: This note was dictated with voice recognition software. Similar sounding words can inadvertently be transcribed and may be missed upon review. Sherrod MARLA Sherrod, MD

## 2024-09-20 NOTE — Progress Notes (Unsigned)
 Cardiology Office Note:   Date:  09/21/2024  ID:  Daniel Colon, Daniel Colon 01-28-1946, MRN [id number] PCP: Yolande Toribio MATSU, MD  [facility] Providers Cardiologist:  Lynwood Schilling, MD {  History of Present Illness:   Daniel Colon is a 78 y.o. male with a hx of CAD, s/p stent to RCA in 1996 in [place]  and non-small cell lung cancer status post status post robotic assisted right upper lobectomy by Dr. Army  in 2021 & chemotherapy, 42-pack-year cigarette smoker having quit in 2012, hypertension, hyperlipidemia.  In March 2024 for preop clearance prior to possible hip surgery.  Patient noted some decreased exercise tolerance, muscle weakness and some dyspnea/chest discomfort on exertion.  He underwent cardiac PET which showed high risk findings including decrease in EF with stress, TID, large perfusion defect.  He then underwent left heart catheterization on Apr 22, 2023 showing three-vessel disease was admitted for CABG workup.  He underwent CABG x 4 utilizing LIMA to LAD, SVG to OM, SVG to Diagonal, SVG to PDA.  With postop blood loss, he was transfused 2 units and later another 2 units.  He remained in normal sinus rhythm.   Carvedilol  and Micardis -HCTZ were discontinued.  Discharged on aspirin  325 mg daily.  Discharged on Micardis  40mg  daily and metoprolol  tartrate 25 mg twice daily.  He had weight gain and edema post op and he was given Lasix  and Zaroxolyn .  BP was low so Micardis  was stopped.  He was in the emergency serum in June and had some shortness of breath.  CT showed no pulmonary embolism.   Since I last saw him he reports that he is not doing well.  He is getting increasing dyspnea with exertion.  This is reminiscent of prior to his surgery.  He never really got completely well and never had significant recovery .  He never really got his muscle strength back.  However, now he is having dyspnea walking short distance on level ground.  He is not having PND or orthopnea.  He never  really had chest pressure, neck or arm discomfort.  He said no cough fevers or chills.  He said no weight gain or edema.  He is very concerned about recurrent coronary stenosis.  ROS: As stated in the HPI and negative for all other systems.  Studies Reviewed:    EKG:   EKG Interpretation Date/Time:  Thursday September 21 2024 10:12:42 EDT Ventricular Rate:  61 PR Interval:  178 QRS Duration:  102 QT Interval:  412 QTC Calculation: 414 R Axis:   71  Text Interpretation: Normal sinus rhythm Possible Left atrial enlargement ST & T wave abnormality, consider inferolateral ischemia When compared with ECG of 03-Aug-2023 09:24, No significant change since last tracing Confirmed by Schilling Lynwood ([id number]) on 09/21/2024 10:14:47 AM    Risk Assessment/Calculations:              Physical Exam:   VS:  BP 136/66 (Patient Position: Sitting, Cuff Size: Normal)   Pulse 62   Ht 5' 8.5 (1.74 m)   Wt 165 lb (74.8 kg)   SpO2 96%   BMI 24.72 kg/m    Wt Readings from Last 3 Encounters:  09/21/24 165 lb (74.8 kg)  01/31/24 164 lb 7.4 oz (74.6 kg)  11/24/23 170 lb (77.1 kg)     GEN: Well nourished, well developed in no acute distress NECK: No JVD; No carotid bruits CARDIAC: RRR, no murmurs, rubs, gallops RESPIRATORY:  Clear to auscultation without rales,  wheezing or rhonchi  ABDOMEN: Soft, non-tender, non-distended EXTREMITIES:  No edema; No deformity   ASSESSMENT AND PLAN:   Diastolic heart failure: He seems to be euvolemic.  No change in therapy.     Coronary artery disease status post CABG: Unfortunately he has had progressive exertional dyspnea reminiscent of his previous obstructive coronary disease.  He is very concerned about recurrent disease.  I think perfusion imaging would likely be difficult to interpret and he also suggests he would have a lack of confidence possibly in these results.  Cardiac catheterization is indicated to rule out obstructive disease and to make sure he has  continued patent bypass grafts given his new possibly unstable symptoms. The patient understands that risks included but are not limited to stroke (1 in 1000), death (1 in 1000), kidney failure [usually temporary] (1 in 500), bleeding (1 in 200), allergic reaction [possibly serious] (1 in 200).  The patient understands and agrees to proceed.   Of note he tells me he is not taking his ASA every day because of easy bruising.   Hypertension: Blood pressure is at target.  No change in therapy.   Hyperlipidemia: LDL was 51.  Continue meds as listed.   53 with an HDL of 47.  No change in therapy.     Follow up with APP in six months.   Signed, Lynwood Schilling, MD

## 2024-09-20 NOTE — H&P (View-Only) (Signed)
 Cardiology Office Note:   Date:  09/21/2024  ID:  Daniel, Colon [birth year], MRN [id number] PCP: Yolande Toribio MATSU, MD  [facility] Providers Cardiologist:  Lynwood Schilling, MD {  History of Present Illness:   Daniel Colon is a 78 y.o. male with a hx of CAD, s/p stent to RCA in 1996 in [place]  and non-small cell lung cancer status post status post robotic assisted right upper lobectomy by Dr. Army  in 2021 & chemotherapy, 42-pack-year cigarette smoker having quit in 2012, hypertension, hyperlipidemia.  In March 2024 for preop clearance prior to possible hip surgery.  Patient noted some decreased exercise tolerance, muscle weakness and some dyspnea/chest discomfort on exertion.  He underwent cardiac PET which showed high risk findings including decrease in EF with stress, TID, large perfusion defect.  He then underwent left heart catheterization on Apr 22, 2023 showing three-vessel disease was admitted for CABG workup.  He underwent CABG x 4 utilizing LIMA to LAD, SVG to OM, SVG to Diagonal, SVG to PDA.  With postop blood loss, he was transfused 2 units and later another 2 units.  He remained in normal sinus rhythm.   Carvedilol  and Micardis -HCTZ were discontinued.  Discharged on aspirin  325 mg daily.  Discharged on Micardis  40mg  daily and metoprolol  tartrate 25 mg twice daily.  He had weight gain and edema post op and he was given Lasix  and Zaroxolyn .  BP was low so Micardis  was stopped.  He was in the emergency serum in June and had some shortness of breath.  CT showed no pulmonary embolism.   Since I last saw him he reports that he is not doing well.  He is getting increasing dyspnea with exertion.  This is reminiscent of prior to his surgery.  He never really got completely well and never had significant recovery .  He never really got his muscle strength back.  However, now he is having dyspnea walking short distance on level ground.  He is not having PND or orthopnea.  He never  really had chest pressure, neck or arm discomfort.  He said no cough fevers or chills.  He said no weight gain or edema.  He is very concerned about recurrent coronary stenosis.  ROS: As stated in the HPI and negative for all other systems.  Studies Reviewed:    EKG:   EKG Interpretation Date/Time:  Thursday September 21 2024 10:12:42 EDT Ventricular Rate:  61 PR Interval:  178 QRS Duration:  102 QT Interval:  412 QTC Calculation: 414 R Axis:   71  Text Interpretation: Normal sinus rhythm Possible Left atrial enlargement ST & T wave abnormality, consider inferolateral ischemia When compared with ECG of 03-Aug-2023 09:24, No significant change since last tracing Confirmed by Schilling Lynwood ([id number]) on 09/21/2024 10:14:47 AM    Risk Assessment/Calculations:              Physical Exam:   VS:  BP 136/66 (Patient Position: Sitting, Cuff Size: Normal)   Pulse 62   Ht 5' 8.5 (1.74 m)   Wt 165 lb (74.8 kg)   SpO2 96%   BMI 24.72 kg/m    Wt Readings from Last 3 Encounters:  09/21/24 165 lb (74.8 kg)  01/31/24 164 lb 7.4 oz (74.6 kg)  11/24/23 170 lb (77.1 kg)     GEN: Well nourished, well developed in no acute distress NECK: No JVD; No carotid bruits CARDIAC: RRR, no murmurs, rubs, gallops RESPIRATORY:  Clear to auscultation without rales,  wheezing or rhonchi  ABDOMEN: Soft, non-tender, non-distended EXTREMITIES:  No edema; No deformity   ASSESSMENT AND PLAN:   Diastolic heart failure: He seems to be euvolemic.  No change in therapy.     Coronary artery disease status post CABG: Unfortunately he has had progressive exertional dyspnea reminiscent of his previous obstructive coronary disease.  He is very concerned about recurrent disease.  I think perfusion imaging would likely be difficult to interpret and he also suggests he would have a lack of confidence possibly in these results.  Cardiac catheterization is indicated to rule out obstructive disease and to make sure he has  continued patent bypass grafts given his new possibly unstable symptoms. The patient understands that risks included but are not limited to stroke (1 in 1000), death (1 in 1000), kidney failure [usually temporary] (1 in 500), bleeding (1 in 200), allergic reaction [possibly serious] (1 in 200).  The patient understands and agrees to proceed.   Of note he tells me he is not taking his ASA every day because of easy bruising.   Hypertension: Blood pressure is at target.  No change in therapy.   Hyperlipidemia: LDL was 51.  Continue meds as listed.   53 with an HDL of 47.  No change in therapy.     Follow up with APP in six months.   Signed, Lynwood Schilling, MD

## 2024-09-21 ENCOUNTER — Encounter: Payer: Self-pay | Admitting: Cardiology

## 2024-09-21 ENCOUNTER — Ambulatory Visit: Attending: Cardiology | Admitting: Cardiology

## 2024-09-21 ENCOUNTER — Ambulatory Visit: Admitting: Internal Medicine

## 2024-09-21 VITALS — BP 136/66 | HR 62 | Ht 68.5 in | Wt 165.0 lb

## 2024-09-21 DIAGNOSIS — I5032 Chronic diastolic (congestive) heart failure: Secondary | ICD-10-CM | POA: Insufficient documentation

## 2024-09-21 DIAGNOSIS — Z951 Presence of aortocoronary bypass graft: Secondary | ICD-10-CM | POA: Insufficient documentation

## 2024-09-21 DIAGNOSIS — E785 Hyperlipidemia, unspecified: Secondary | ICD-10-CM | POA: Diagnosis not present

## 2024-09-21 DIAGNOSIS — I5031 Acute diastolic (congestive) heart failure: Secondary | ICD-10-CM | POA: Insufficient documentation

## 2024-09-21 DIAGNOSIS — I10 Essential (primary) hypertension: Secondary | ICD-10-CM | POA: Insufficient documentation

## 2024-09-21 DIAGNOSIS — G4733 Obstructive sleep apnea (adult) (pediatric): Secondary | ICD-10-CM | POA: Diagnosis not present

## 2024-09-21 MED ORDER — ATORVASTATIN CALCIUM 40 MG PO TABS: 40.0000 mg | ORAL_TABLET | Freq: Every day | ORAL | 3 refills | Status: AC

## 2024-09-21 MED ORDER — TORSEMIDE 20 MG PO TABS: 40.0000 mg | ORAL_TABLET | Freq: Every day | ORAL | 3 refills | Status: AC

## 2024-09-21 MED ORDER — METOPROLOL TARTRATE 25 MG PO TABS: 25.0000 mg | ORAL_TABLET | Freq: Two times a day (BID) | ORAL | 3 refills | Status: AC

## 2024-09-21 NOTE — Addendum Note (Signed)
 Addended by: Neli Fofana N on: 09/21/2024 11:23 AM   Modules accepted: Orders

## 2024-09-21 NOTE — Patient Instructions (Signed)
 Medication Instructions:  Your physician recommends that you continue on your current medications as directed. Please refer to the Current Medication list given to you today.  *If you need a refill on your cardiac medications before your next appointment, please call your pharmacy*  Lab Work: NONE If you have labs (blood work) drawn today and your tests are completely normal, you will receive your results only by: MyChart Message (if you have MyChart) OR A paper copy in the mail If you have any lab test that is abnormal or we need to change your treatment, we will call you to review the results.  Testing/Procedures: Left Heart Cath  Follow-Up: At [facility], you and your health needs are our priority.  As part of our continuing mission to provide you with exceptional heart care, our providers are all part of one team.  This team includes your primary Cardiologist (physician) and Advanced Practice Providers or APPs (Physician Assistants and Nurse Practitioners) who all work together to provide you with the care you need, when you need it.  Your next appointment:   6 months  Provider:   APP  We recommend signing up for the patient portal called MyChart.  Sign up information is provided on this After Visit Summary.  MyChart is used to connect with patients for Virtual Visits (Telemedicine).  Patients are able to view lab/test results, encounter notes, upcoming appointments, etc.  Non-urgent messages can be sent to your provider as well.   To learn more about what you can do with MyChart, go to [email or web address].   Other Instructions

## 2024-09-26 ENCOUNTER — Inpatient Hospital Stay: Attending: Physician Assistant | Admitting: Physician Assistant

## 2024-09-26 VITALS — BP 134/82 | HR 55 | Temp 97.9°F | Resp 15 | Wt 164.2 lb

## 2024-09-26 DIAGNOSIS — I252 Old myocardial infarction: Secondary | ICD-10-CM | POA: Diagnosis not present

## 2024-09-26 DIAGNOSIS — Z85118 Personal history of other malignant neoplasm of bronchus and lung: Secondary | ICD-10-CM | POA: Insufficient documentation

## 2024-09-26 DIAGNOSIS — C3401 Malignant neoplasm of right main bronchus: Secondary | ICD-10-CM | POA: Diagnosis not present

## 2024-09-26 DIAGNOSIS — Z9221 Personal history of antineoplastic chemotherapy: Secondary | ICD-10-CM | POA: Diagnosis not present

## 2024-09-26 DIAGNOSIS — R0609 Other forms of dyspnea: Secondary | ICD-10-CM | POA: Diagnosis not present

## 2024-09-26 DIAGNOSIS — Z902 Acquired absence of lung [part of]: Secondary | ICD-10-CM | POA: Insufficient documentation

## 2024-09-26 DIAGNOSIS — C3490 Malignant neoplasm of unspecified part of unspecified bronchus or lung: Secondary | ICD-10-CM

## 2024-09-26 DIAGNOSIS — Z08 Encounter for follow-up examination after completed treatment for malignant neoplasm: Secondary | ICD-10-CM | POA: Diagnosis not present

## 2024-09-27 ENCOUNTER — Telehealth: Payer: Self-pay | Admitting: *Deleted

## 2024-09-27 NOTE — Telephone Encounter (Addendum)
 Cardiac Catheterization scheduled at [facility] for: Thursday September 28, 2024 7:30 AM Arrival time [facility] Main Entrance A at: 5:30 AM  Diet: -Nothing to eat after midnight.  Hydration: -May drink clear liquids until 2 hours before the procedure.  Approved liquids: Water , clear tea, black coffee, fruit juices-non-citric and without pulp,Gatorade, plain Jello/popsicles.   -Please drink 16 oz of water  2 hours before procedure.  Medication instructions: -Hold:  Torsemide /KCl-AM of procedure -Other usual morning medications can be taken including aspirin  81 mg.  Plan to go home the same day, you will only stay overnight if medically necessary.  You must have responsible adult to drive you home.  Someone must be with you the first 24 hours after you arrive home.  Reviewed procedure instructions with patient's wife (DPR), Devere.

## 2024-09-28 ENCOUNTER — Ambulatory Visit (HOSPITAL_COMMUNITY)
Admission: RE | Admit: 2024-09-28 | Discharge: 2024-09-28 | Disposition: A | Discharge status: Home / Self Care | Attending: Internal Medicine | Admitting: Internal Medicine

## 2024-09-28 ENCOUNTER — Other Ambulatory Visit: Payer: Self-pay

## 2024-09-28 ENCOUNTER — Encounter (HOSPITAL_COMMUNITY)
Admission: RE | Disposition: A | Discharge status: Home / Self Care | Payer: Self-pay | Source: Home / Self Care | Attending: Internal Medicine

## 2024-09-28 DIAGNOSIS — Z9221 Personal history of antineoplastic chemotherapy: Secondary | ICD-10-CM | POA: Insufficient documentation

## 2024-09-28 DIAGNOSIS — R0602 Shortness of breath: Secondary | ICD-10-CM | POA: Diagnosis not present

## 2024-09-28 DIAGNOSIS — Z7982 Long term (current) use of aspirin: Secondary | ICD-10-CM | POA: Insufficient documentation

## 2024-09-28 DIAGNOSIS — Z951 Presence of aortocoronary bypass graft: Secondary | ICD-10-CM | POA: Diagnosis not present

## 2024-09-28 DIAGNOSIS — Z85118 Personal history of other malignant neoplasm of bronchus and lung: Secondary | ICD-10-CM | POA: Diagnosis not present

## 2024-09-28 DIAGNOSIS — Z955 Presence of coronary angioplasty implant and graft: Secondary | ICD-10-CM | POA: Diagnosis not present

## 2024-09-28 DIAGNOSIS — I2582 Chronic total occlusion of coronary artery: Secondary | ICD-10-CM | POA: Insufficient documentation

## 2024-09-28 DIAGNOSIS — I5030 Unspecified diastolic (congestive) heart failure: Secondary | ICD-10-CM | POA: Insufficient documentation

## 2024-09-28 DIAGNOSIS — I2510 Atherosclerotic heart disease of native coronary artery without angina pectoris: Secondary | ICD-10-CM | POA: Diagnosis not present

## 2024-09-28 DIAGNOSIS — I2584 Coronary atherosclerosis due to calcified coronary lesion: Secondary | ICD-10-CM | POA: Insufficient documentation

## 2024-09-28 DIAGNOSIS — Z87891 Personal history of nicotine dependence: Secondary | ICD-10-CM | POA: Insufficient documentation

## 2024-09-28 DIAGNOSIS — Z79899 Other long term (current) drug therapy: Secondary | ICD-10-CM | POA: Diagnosis not present

## 2024-09-28 DIAGNOSIS — E785 Hyperlipidemia, unspecified: Secondary | ICD-10-CM | POA: Insufficient documentation

## 2024-09-28 DIAGNOSIS — I110 Hypertensive heart disease with heart failure: Secondary | ICD-10-CM | POA: Insufficient documentation

## 2024-09-28 HISTORY — PX: LEFT HEART CATH AND CORS/GRAFTS ANGIOGRAPHY: CATH118250

## 2024-09-28 LAB — BASIC METABOLIC PANEL WITH GFR
Anion gap: 13 (ref 5–15)
BUN: 20 mg/dL (ref 8–23)
CO2: 29 mmol/L (ref 22–32)
Calcium: 8.8 mg/dL — ABNORMAL LOW (ref 8.9–10.3)
Chloride: 94 mmol/L — ABNORMAL LOW (ref 98–111)
Creatinine, Ser: 1 mg/dL (ref 0.61–1.24)
GFR, Estimated: >60 mL/min (ref >60)
Glucose, Bld: 103 mg/dL — ABNORMAL HIGH (ref 70–99)
Potassium: 3.1 mmol/L — ABNORMAL LOW (ref 3.5–5.1)
Sodium: 136 mmol/L (ref 135–145)

## 2024-09-28 SURGERY — LEFT HEART CATH AND CORS/GRAFTS ANGIOGRAPHY
Anesthesia: LOCAL

## 2024-09-28 MED ORDER — FENTANYL CITRATE (PF) 100 MCG/2ML IJ SOLN
INTRAMUSCULAR | Status: DC | PRN
  Administered 2024-09-28: 25 ug via INTRAVENOUS

## 2024-09-28 MED ORDER — ONDANSETRON HCL 4 MG/2ML IJ SOLN: 4.0000 mg | Freq: Four times a day (QID) | INTRAMUSCULAR | Status: DC | PRN

## 2024-09-28 MED ORDER — LABETALOL HCL 5 MG/ML IV SOLN: 10.0000 mg | INTRAVENOUS | Status: DC | PRN

## 2024-09-28 MED ORDER — FREE WATER: 500.0000 mL | Freq: Once | Status: DC

## 2024-09-28 MED ORDER — POTASSIUM CHLORIDE CRYS ER 20 MEQ PO TBCR
40.0000 meq | EXTENDED_RELEASE_TABLET | Freq: Once | ORAL | Status: AC
  Administered 2024-09-28: 40 meq via ORAL

## 2024-09-28 MED ORDER — HEPARIN (PORCINE) IN NACL 1000-0.9 UT/500ML-% IV SOLN
INTRAVENOUS | Status: DC | PRN
  Administered 2024-09-28 (×2): 500 mL

## 2024-09-28 MED ORDER — LIDOCAINE HCL (PF) 1 % IJ SOLN
INTRAMUSCULAR | Status: DC | PRN
  Administered 2024-09-28: 2 mL via INTRADERMAL

## 2024-09-28 MED ORDER — IOHEXOL 350 MG/ML SOLN
INTRAVENOUS | Status: DC | PRN
  Administered 2024-09-28: 75 mL

## 2024-09-28 MED ORDER — HEPARIN SODIUM (PORCINE) 1000 UNIT/ML IJ SOLN
INTRAMUSCULAR | Status: AC
Start: 2024-09-28 — End: 2024-09-28
  Filled 2024-09-28: qty 10

## 2024-09-28 MED ORDER — POTASSIUM CHLORIDE ER 10 MEQ PO TBCR: 20.0000 meq | EXTENDED_RELEASE_TABLET | Freq: Two times a day (BID) | ORAL | Status: DC

## 2024-09-28 MED ORDER — SODIUM CHLORIDE 0.9 % IV SOLN: 250.0000 mL | INTRAVENOUS | Status: DC | PRN

## 2024-09-28 MED ORDER — VERAPAMIL HCL 2.5 MG/ML IV SOLN
INTRAVENOUS | Status: AC
  Filled 2024-09-28: qty 2

## 2024-09-28 MED ORDER — POTASSIUM CHLORIDE CRYS ER 20 MEQ PO TBCR
EXTENDED_RELEASE_TABLET | ORAL | Status: AC
  Filled 2024-09-28: qty 2

## 2024-09-28 MED ORDER — SODIUM CHLORIDE 0.9% FLUSH: 3.0000 mL | INTRAVENOUS | Status: DC | PRN

## 2024-09-28 MED ORDER — SODIUM CHLORIDE 0.9% FLUSH: 3.0000 mL | Freq: Two times a day (BID) | INTRAVENOUS | Status: DC

## 2024-09-28 MED ORDER — HYDRALAZINE HCL 20 MG/ML IJ SOLN: 10.0000 mg | INTRAMUSCULAR | Status: DC | PRN

## 2024-09-28 MED ORDER — VERAPAMIL HCL 2.5 MG/ML IV SOLN
INTRAVENOUS | Status: DC | PRN
  Administered 2024-09-28: 10 mL via INTRA_ARTERIAL

## 2024-09-28 MED ORDER — SODIUM CHLORIDE 0.9% FLUSH
3.0000 mL | Freq: Two times a day (BID) | INTRAVENOUS | Status: DC
Start: 2024-09-28 — End: 2024-09-28

## 2024-09-28 MED ORDER — HEPARIN SODIUM (PORCINE) 1000 UNIT/ML IJ SOLN
INTRAMUSCULAR | Status: DC | PRN
  Administered 2024-09-28: 4000 [IU] via INTRAVENOUS

## 2024-09-28 MED ORDER — LIDOCAINE HCL (PF) 1 % IJ SOLN
INTRAMUSCULAR | Status: AC
  Filled 2024-09-28: qty 30

## 2024-09-28 MED ORDER — MIDAZOLAM HCL 2 MG/2ML IJ SOLN
INTRAMUSCULAR | Status: AC
  Filled 2024-09-28: qty 2

## 2024-09-28 MED ORDER — FENTANYL CITRATE (PF) 100 MCG/2ML IJ SOLN
INTRAMUSCULAR | Status: AC
  Filled 2024-09-28: qty 2

## 2024-09-28 MED ORDER — ACETAMINOPHEN 325 MG PO TABS: 650.0000 mg | ORAL_TABLET | ORAL | Status: DC | PRN

## 2024-09-28 MED ORDER — ASPIRIN 81 MG PO CHEW: 81.0000 mg | CHEWABLE_TABLET | ORAL | Status: AC

## 2024-09-28 MED ORDER — MIDAZOLAM HCL 2 MG/2ML IJ SOLN
INTRAMUSCULAR | Status: DC | PRN
  Administered 2024-09-28: 1 mg via INTRAVENOUS

## 2024-09-28 SURGICAL SUPPLY — 10 items
CATH INFINITI 5 FR IM (CATHETERS) IMPLANT
CATH INFINITI 5 FR MPA2 (CATHETERS) IMPLANT
CATH INFINITI 5FR JL4 (CATHETERS) IMPLANT
DEVICE RAD COMP TR BAND LRG (VASCULAR PRODUCTS) IMPLANT
ELECT DEFIB PAD ADLT CADENCE (PAD) IMPLANT
GLIDESHEATH SLEND SS 6F .021 (SHEATH) IMPLANT
GUIDEWIRE INQWIRE 1.5J.035X260 (WIRE) IMPLANT
KIT SYRINGE INJ CVI SPIKEX1 (MISCELLANEOUS) IMPLANT
PACK CARDIAC CATHETERIZATION (CUSTOM PROCEDURE TRAY) ×1 IMPLANT
SET ATX-X65L (MISCELLANEOUS) IMPLANT

## 2024-09-28 NOTE — Progress Notes (Signed)
 TR Band removed, no s/s of complications at incision site. Discharge instructions reviewed with patient and wife Daniel Colon at bedside. Verbalized understanding denies questions or concerns. PT tolerated PO intake, able to void in the bathroom, ambulate in the hallway  without difficulty. PT was escorted from the unit via wheel cahir to personal vehicle.

## 2024-09-28 NOTE — Interval H&P Note (Signed)
 History and Physical Interval Note:  09/28/2024 7:16 AM  Daniel Colon  has presented today for surgery, with the diagnosis of coronary artery disease, shortness of breath, and fatigue.  The various methods of treatment have been discussed with the patient and family. After consideration of risks, benefits and other options for treatment, the patient has consented to  Procedure(s): LEFT HEART CATH AND CORS/GRAFTS ANGIOGRAPHY (N/A) as a surgical intervention.  The patient's history has been reviewed, patient examined, no change in status, stable for surgery.  I have reviewed the patient's chart and labs.  Questions were answered to the patient's satisfaction.    Cath Lab Visit (complete for each Cath Lab visit)  Clinical Evaluation Leading to the Procedure:   ACS: No.  Non-ACS:    Anginal Classification: CCS III  Anti-ischemic medical therapy: Minimal Therapy (1 class of medications)  Non-Invasive Test Results: No non-invasive testing performed  Prior CABG: Previous CABG  Daniel Colon

## 2024-09-28 NOTE — Discharge Instructions (Signed)

## 2024-09-29 ENCOUNTER — Encounter (HOSPITAL_COMMUNITY): Payer: Self-pay | Admitting: Internal Medicine

## 2024-10-10 DIAGNOSIS — Z951 Presence of aortocoronary bypass graft: Secondary | ICD-10-CM | POA: Diagnosis not present

## 2024-10-10 DIAGNOSIS — I10 Essential (primary) hypertension: Secondary | ICD-10-CM | POA: Diagnosis not present

## 2024-10-10 DIAGNOSIS — I2510 Atherosclerotic heart disease of native coronary artery without angina pectoris: Secondary | ICD-10-CM | POA: Diagnosis not present

## 2024-10-10 DIAGNOSIS — G4733 Obstructive sleep apnea (adult) (pediatric): Secondary | ICD-10-CM | POA: Diagnosis not present

## 2024-10-10 DIAGNOSIS — J4489 Other specified chronic obstructive pulmonary disease: Secondary | ICD-10-CM | POA: Diagnosis not present

## 2024-11-09 ENCOUNTER — Encounter: Payer: Self-pay | Admitting: Family Medicine

## 2024-11-20 ENCOUNTER — Telehealth: Payer: Self-pay

## 2024-11-20 NOTE — Telephone Encounter (Signed)
 Pharmacy Patient Advocate Encounter   Received notification from [employer] that prior authorization for Linzess  capsules is required/requested.   Insurance verification completed.   The patient is insured through [employer].   Per test claim:   xxx

## 2024-11-20 NOTE — Telephone Encounter (Signed)
 Patient has scheduled follow up with Dr Albertus on 11/23/24 in order to continue getting Linzess  paid for by insurance. Patient states he is doing wonderful on the medication.

## 2024-11-23 ENCOUNTER — Ambulatory Visit: Admitting: Internal Medicine

## 2024-11-23 ENCOUNTER — Encounter (HOSPITAL_COMMUNITY): Payer: Self-pay | Admitting: General Surgery

## 2024-11-23 VITALS — BP 118/66 | HR 50 | Ht 68.0 in | Wt 163.0 lb

## 2024-11-23 DIAGNOSIS — K5909 Other constipation: Secondary | ICD-10-CM | POA: Diagnosis not present

## 2024-11-23 DIAGNOSIS — Z860101 Personal history of adenomatous and serrated colon polyps: Secondary | ICD-10-CM

## 2024-11-23 DIAGNOSIS — K219 Gastro-esophageal reflux disease without esophagitis: Secondary | ICD-10-CM

## 2024-11-23 DIAGNOSIS — K2270 Barrett's esophagus without dysplasia: Secondary | ICD-10-CM | POA: Diagnosis not present

## 2024-11-23 DIAGNOSIS — K2100 Gastro-esophageal reflux disease with esophagitis, without bleeding: Secondary | ICD-10-CM

## 2024-11-23 MED ORDER — OMEPRAZOLE 20 MG PO CPDR: 20.0000 mg | DELAYED_RELEASE_CAPSULE | Freq: Every morning | ORAL | 3 refills | Status: AC

## 2024-11-23 MED ORDER — LINACLOTIDE 290 MCG PO CAPS: ORAL_CAPSULE | ORAL | 3 refills | Status: AC

## 2024-11-23 NOTE — Patient Instructions (Signed)
 We have sent the following medications to your pharmacy for you to pick up at your convenience: Linzess  290 mcg daily and omeprazole 20 mg daily. Take them both together in the morning.   _______________________________________________________  If your blood pressure at your visit was 140/90 or greater, please contact your primary care physician to follow up on this.  _______________________________________________________  If you are age 76 or older, your body mass index should be between 23-30. Your Body mass index is 24.78 kg/m. If this is out of the aforementioned range listed, please consider follow up with your Primary Care Provider.  If you are age 45 or younger, your body mass index should be between 19-25. Your Body mass index is 24.78 kg/m. If this is out of the aformentioned range listed, please consider follow up with your Primary Care Provider.   ________________________________________________________  The [place] [facility] providers would like to encourage you to use MYCHART to communicate with providers for non-urgent requests or questions.  Due to long hold times on the telephone, sending your provider a message by [facility] may be a faster and more efficient way to get a response.  Please allow 48 business hours for a response.  Please remember that this is for non-urgent requests.  _______________________________________________________  [facility] is using a team-based approach to care.  Your team is made up of your doctor and two to three APPS. Our APPS (Nurse Practitioners and Physician Assistants) work with your physician to ensure care continuity for you. They are fully qualified to address your health concerns and develop a treatment plan. They communicate directly with your gastroenterologist to care for you. Seeing the Advanced Practice Practitioners on your physician's team can help you by facilitating care more promptly, often allowing for earlier appointments,  access to diagnostic testing, procedures, and other specialty referrals.

## 2024-11-23 NOTE — Progress Notes (Signed)
 Subjective:    Patient ID: Daniel Colon, male    DOB: Dec 13, 1946, 78 y.o.   MRN: [id number]  HPI Daniel Colon is a 78 year old male with GERD and chronic constipation who presents for follow-up.  He was last seen in February 2025 for an upper and lower endoscopy. The EGD showed mild candid esophagitis and mucosal changes consistent with Barrett's esophagus, with a one centimeter hiatal hernia and patchy erythematous mucosa in the antrum. Biopsies confirmed Barrett's esophagus with focal mild acute inflammation and mild chronic gastritis with extensive intestinal metaplasia, negative for dysplasia. Biopsies of the stomach showed minimal chronic gastritis, negative for H. pylori. He takes omeprazole 10 mg, which he has been on for about 20 years, and experiences rare heartburn, usually triggered by eating ice cream late at night. No trouble swallowing.  For his chronic constipation, he has been maintained on Linzess  290 mcg. He finds the medication 'eighty five to ninety percent effective' compared to over-the-counter options. He experiences bowel movements approximately four times a week. He takes Linzess  early in the morning around 4:30 AM and typically eats breakfast between 7:00 and 7:30 AM. His insurance has placed a marker on his Linzess  prescription, likely due to cost concerns.  During the February colonoscopy, two polyps were found, one precancerous adenoma and one hyperplastic, along with left colonic diverticulosis and small internal hemorrhoids.  He enjoys golfing three days a week and has recently taken up yard work again after letting go of his yard service. He finds yard work enjoyable.   Review of Systems As per HPI, otherwise negative  Current Medications, Allergies, Past Medical History, Past Surgical History, Family History and Social History were reviewed in [employer] record.    Objective:   Physical Exam BP 118/66   Pulse (!) 50   Ht  5' 8 (1.727 m)   Wt 163 lb (73.9 kg)   SpO2 98%   BMI 24.78 kg/m  Gen: awake, alert, NAD HEENT: anicteric  Neuro: nonfocal  DIAGNOSTIC EGD: Mild candid esophagitis, mucosal changes consistent with Barrett's esophagus between 37 cm and 41 cm from the incisors, circumferential from 38 to 41 cm with two tongues of salmon-colored mucosa from 37 to 38 cm and an island of salmon mucosa at 37 cm, 1 cm hiatal hernia, patchy erythematous mucosa in the antrum (01/2024) Colonoscopy: Two polyps ranging from 4 to 5 mm in size, left colonic diverticulosis, small internal hemorrhoids. Polyps: adenomatous (1), hyperplastic (1) (01/2024)  PATHOLOGY Esophagus biopsy: Barrett's esophagus with focal mild acute inflammation, negative for dysplasia (01/2024) Stomach biopsy: Minimal chronic gastritis, negative for H. pylori (01/2024)      Assessment & Plan:   Barrett's esophagus without dysplasia Barrett's esophagus managed with no dysplasia. Biopsies show mild acute inflammation, mild chronic gastritis, and extensive intestinal metaplasia. Precancerous condition requires inflammation control. - Increasedomeprazole to 20 mg daily before a meal. - Scheduled follow-up upper endoscopy in three years.  Gastroesophageal reflux disease (GERD) GERD controlled with rare heartburn, omeprazole dose increased for Barrett's esophagus inflammation control. - Increased omeprazole to 20 mg daily before a meal.  Chronic constipation Managed with Linzess  290 mcg, achieving four bowel movements per week. - Continue Linzess  290 mcg daily. - Take Linzess  and omeprazole together in the morning on an empty stomach.  Hx of adenoma of the colon One small adenoma in Feb - No recall age  29-2 yr follow-up, sooner PRN  30 minutes total spent today including patient  facing time, coordination of care, reviewing medical history/procedures/pertinent radiology studies, and documentation of the encounter.

## 2024-12-03 ENCOUNTER — Encounter: Payer: Self-pay | Admitting: Cardiology

## 2024-12-05 DIAGNOSIS — D1801 Hemangioma of skin and subcutaneous tissue: Secondary | ICD-10-CM | POA: Diagnosis not present

## 2024-12-05 DIAGNOSIS — D692 Other nonthrombocytopenic purpura: Secondary | ICD-10-CM | POA: Diagnosis not present

## 2024-12-05 DIAGNOSIS — L821 Other seborrheic keratosis: Secondary | ICD-10-CM | POA: Diagnosis not present

## 2024-12-05 DIAGNOSIS — Z85828 Personal history of other malignant neoplasm of skin: Secondary | ICD-10-CM | POA: Diagnosis not present

## 2024-12-05 DIAGNOSIS — L570 Actinic keratosis: Secondary | ICD-10-CM | POA: Diagnosis not present

## 2024-12-06 ENCOUNTER — Telehealth: Payer: Self-pay

## 2024-12-06 ENCOUNTER — Other Ambulatory Visit (HOSPITAL_COMMUNITY): Payer: Self-pay

## 2024-12-06 ENCOUNTER — Encounter: Payer: Self-pay | Admitting: Internal Medicine

## 2024-12-06 MED ORDER — POTASSIUM CHLORIDE ER 10 MEQ PO TBCR: 20.0000 meq | EXTENDED_RELEASE_TABLET | Freq: Two times a day (BID) | ORAL | 2 refills | Status: AC

## 2024-12-06 NOTE — Telephone Encounter (Signed)
 Pharmacy Patient Advocate Encounter   Received notification from Patient Advice Request messages that prior authorization for Linzess  290 MCG capsule is required/requested.   Insurance verification completed.   The patient is insured through [employer].   Per test claim: PA required; PA submitted to above mentioned insurance via Fax Key/confirmation #/EOC [id number] Status is pending

## 2024-12-06 NOTE — Telephone Encounter (Signed)
 PA request has been Submitted. New Encounter has been or will be created for follow up. For additional info see Pharmacy Prior Auth telephone encounter from 12-06-2024.

## 2024-12-07 ENCOUNTER — Other Ambulatory Visit (HOSPITAL_COMMUNITY): Payer: Self-pay

## 2024-12-07 NOTE — Telephone Encounter (Signed)
 Pharmacy Patient Advocate Encounter  Received notification from [employer] that Prior Authorization for Linzess  290 MCG capsule has been APPROVED from 12-06-2024 to 12-20-2098. Ran test claim, Copay is $43.00. This test claim was processed through [facility]- copay amounts may vary at other pharmacies due to pharmacy/plan contracts, or as the patient moves through the different stages of their insurance plan.  Insurance will pay a MAXIMUM of 30 day supply at a time.   Insurance states that further retail fills may have a higher co-pay  PA #/Case ID/Reference #: xxx

## 2025-09-11 ENCOUNTER — Other Ambulatory Visit

## 2025-09-18 ENCOUNTER — Ambulatory Visit: Admitting: Internal Medicine
